# Patient Record
Sex: Male | Born: 1950 | Marital: Married | Smoking: Former smoker
Health system: Southern US, Community
[De-identification: ages and names within clinical notes are randomized; demographics above are authoritative.]

## PROBLEM LIST (undated history)

## (undated) ENCOUNTER — Emergency Department (HOSPITAL_COMMUNITY): Admission: EM | Payer: Self-pay | Source: Home / Self Care

## (undated) DIAGNOSIS — E785 Hyperlipidemia, unspecified: Secondary | ICD-10-CM

## (undated) DIAGNOSIS — I10 Essential (primary) hypertension: Secondary | ICD-10-CM

## (undated) DIAGNOSIS — E119 Type 2 diabetes mellitus without complications: Secondary | ICD-10-CM

---

## 2015-10-31 DIAGNOSIS — E1140 Type 2 diabetes mellitus with diabetic neuropathy, unspecified: Secondary | ICD-10-CM | POA: Diagnosis not present

## 2015-11-07 DIAGNOSIS — K219 Gastro-esophageal reflux disease without esophagitis: Secondary | ICD-10-CM | POA: Diagnosis not present

## 2015-11-07 DIAGNOSIS — R0982 Postnasal drip: Secondary | ICD-10-CM | POA: Diagnosis not present

## 2015-11-07 DIAGNOSIS — J309 Allergic rhinitis, unspecified: Secondary | ICD-10-CM | POA: Diagnosis not present

## 2015-11-07 DIAGNOSIS — E1140 Type 2 diabetes mellitus with diabetic neuropathy, unspecified: Secondary | ICD-10-CM | POA: Diagnosis not present

## 2015-11-07 DIAGNOSIS — E7800 Pure hypercholesterolemia, unspecified: Secondary | ICD-10-CM | POA: Diagnosis not present

## 2015-11-07 DIAGNOSIS — I10 Essential (primary) hypertension: Secondary | ICD-10-CM | POA: Diagnosis not present

## 2015-11-07 DIAGNOSIS — Z0000 Encounter for general adult medical examination without abnormal findings: Secondary | ICD-10-CM | POA: Diagnosis not present

## 2015-11-07 DIAGNOSIS — G629 Polyneuropathy, unspecified: Secondary | ICD-10-CM | POA: Diagnosis not present

## 2016-01-31 DIAGNOSIS — E1140 Type 2 diabetes mellitus with diabetic neuropathy, unspecified: Secondary | ICD-10-CM | POA: Diagnosis not present

## 2016-02-13 DIAGNOSIS — Z23 Encounter for immunization: Secondary | ICD-10-CM | POA: Diagnosis not present

## 2016-02-13 DIAGNOSIS — Z1389 Encounter for screening for other disorder: Secondary | ICD-10-CM | POA: Diagnosis not present

## 2016-02-13 DIAGNOSIS — J309 Allergic rhinitis, unspecified: Secondary | ICD-10-CM | POA: Diagnosis not present

## 2016-02-13 DIAGNOSIS — I10 Essential (primary) hypertension: Secondary | ICD-10-CM | POA: Diagnosis not present

## 2016-02-13 DIAGNOSIS — R0982 Postnasal drip: Secondary | ICD-10-CM | POA: Diagnosis not present

## 2016-02-13 DIAGNOSIS — Z9181 History of falling: Secondary | ICD-10-CM | POA: Diagnosis not present

## 2016-02-13 DIAGNOSIS — E1140 Type 2 diabetes mellitus with diabetic neuropathy, unspecified: Secondary | ICD-10-CM | POA: Diagnosis not present

## 2016-02-13 DIAGNOSIS — K219 Gastro-esophageal reflux disease without esophagitis: Secondary | ICD-10-CM | POA: Diagnosis not present

## 2016-06-03 DIAGNOSIS — E1140 Type 2 diabetes mellitus with diabetic neuropathy, unspecified: Secondary | ICD-10-CM | POA: Diagnosis not present

## 2016-06-11 DIAGNOSIS — K219 Gastro-esophageal reflux disease without esophagitis: Secondary | ICD-10-CM | POA: Diagnosis not present

## 2016-06-11 DIAGNOSIS — J309 Allergic rhinitis, unspecified: Secondary | ICD-10-CM | POA: Diagnosis not present

## 2016-06-11 DIAGNOSIS — R0982 Postnasal drip: Secondary | ICD-10-CM | POA: Diagnosis not present

## 2016-06-11 DIAGNOSIS — I10 Essential (primary) hypertension: Secondary | ICD-10-CM | POA: Diagnosis not present

## 2016-06-11 DIAGNOSIS — E1140 Type 2 diabetes mellitus with diabetic neuropathy, unspecified: Secondary | ICD-10-CM | POA: Diagnosis not present

## 2016-06-11 DIAGNOSIS — E7800 Pure hypercholesterolemia, unspecified: Secondary | ICD-10-CM | POA: Diagnosis not present

## 2016-06-11 DIAGNOSIS — Z23 Encounter for immunization: Secondary | ICD-10-CM | POA: Diagnosis not present

## 2016-07-18 DIAGNOSIS — H25813 Combined forms of age-related cataract, bilateral: Secondary | ICD-10-CM | POA: Diagnosis not present

## 2016-07-18 DIAGNOSIS — E119 Type 2 diabetes mellitus without complications: Secondary | ICD-10-CM | POA: Diagnosis not present

## 2016-09-05 DIAGNOSIS — Z86010 Personal history of colonic polyps: Secondary | ICD-10-CM | POA: Diagnosis not present

## 2016-09-05 DIAGNOSIS — Z1211 Encounter for screening for malignant neoplasm of colon: Secondary | ICD-10-CM | POA: Diagnosis not present

## 2016-09-05 DIAGNOSIS — Z1212 Encounter for screening for malignant neoplasm of rectum: Secondary | ICD-10-CM | POA: Diagnosis not present

## 2016-09-09 DIAGNOSIS — E1142 Type 2 diabetes mellitus with diabetic polyneuropathy: Secondary | ICD-10-CM | POA: Diagnosis not present

## 2016-09-09 DIAGNOSIS — I10 Essential (primary) hypertension: Secondary | ICD-10-CM | POA: Diagnosis not present

## 2016-09-09 DIAGNOSIS — E7800 Pure hypercholesterolemia, unspecified: Secondary | ICD-10-CM | POA: Diagnosis not present

## 2016-09-09 DIAGNOSIS — K219 Gastro-esophageal reflux disease without esophagitis: Secondary | ICD-10-CM | POA: Diagnosis not present

## 2016-09-09 DIAGNOSIS — E1140 Type 2 diabetes mellitus with diabetic neuropathy, unspecified: Secondary | ICD-10-CM | POA: Diagnosis not present

## 2016-09-09 DIAGNOSIS — Z6838 Body mass index (BMI) 38.0-38.9, adult: Secondary | ICD-10-CM | POA: Diagnosis not present

## 2016-10-08 DIAGNOSIS — I10 Essential (primary) hypertension: Secondary | ICD-10-CM | POA: Diagnosis not present

## 2016-10-08 DIAGNOSIS — Z86010 Personal history of colonic polyps: Secondary | ICD-10-CM | POA: Diagnosis not present

## 2016-10-08 DIAGNOSIS — M109 Gout, unspecified: Secondary | ICD-10-CM | POA: Diagnosis not present

## 2016-10-08 DIAGNOSIS — Z79899 Other long term (current) drug therapy: Secondary | ICD-10-CM | POA: Diagnosis not present

## 2016-10-08 DIAGNOSIS — Z1211 Encounter for screening for malignant neoplasm of colon: Secondary | ICD-10-CM | POA: Diagnosis not present

## 2016-10-08 DIAGNOSIS — D124 Benign neoplasm of descending colon: Secondary | ICD-10-CM | POA: Diagnosis not present

## 2016-10-08 DIAGNOSIS — E119 Type 2 diabetes mellitus without complications: Secondary | ICD-10-CM | POA: Diagnosis not present

## 2016-10-08 DIAGNOSIS — K5730 Diverticulosis of large intestine without perforation or abscess without bleeding: Secondary | ICD-10-CM | POA: Diagnosis not present

## 2016-10-08 DIAGNOSIS — K648 Other hemorrhoids: Secondary | ICD-10-CM | POA: Diagnosis not present

## 2016-10-08 DIAGNOSIS — Z7984 Long term (current) use of oral hypoglycemic drugs: Secondary | ICD-10-CM | POA: Diagnosis not present

## 2016-11-05 DIAGNOSIS — W06XXXA Fall from bed, initial encounter: Secondary | ICD-10-CM | POA: Diagnosis not present

## 2016-11-05 DIAGNOSIS — S0511XA Contusion of eyeball and orbital tissues, right eye, initial encounter: Secondary | ICD-10-CM | POA: Diagnosis not present

## 2016-11-05 DIAGNOSIS — S0083XA Contusion of other part of head, initial encounter: Secondary | ICD-10-CM | POA: Diagnosis not present

## 2016-11-05 DIAGNOSIS — S065X0A Traumatic subdural hemorrhage without loss of consciousness, initial encounter: Secondary | ICD-10-CM | POA: Diagnosis not present

## 2017-01-07 DIAGNOSIS — E1140 Type 2 diabetes mellitus with diabetic neuropathy, unspecified: Secondary | ICD-10-CM | POA: Diagnosis not present

## 2017-01-14 DIAGNOSIS — E1140 Type 2 diabetes mellitus with diabetic neuropathy, unspecified: Secondary | ICD-10-CM | POA: Diagnosis not present

## 2017-01-14 DIAGNOSIS — E785 Hyperlipidemia, unspecified: Secondary | ICD-10-CM | POA: Diagnosis not present

## 2017-01-14 DIAGNOSIS — Z0000 Encounter for general adult medical examination without abnormal findings: Secondary | ICD-10-CM | POA: Diagnosis not present

## 2017-01-14 DIAGNOSIS — K219 Gastro-esophageal reflux disease without esophagitis: Secondary | ICD-10-CM | POA: Diagnosis not present

## 2017-03-10 DIAGNOSIS — Z23 Encounter for immunization: Secondary | ICD-10-CM | POA: Diagnosis not present

## 2017-07-08 DIAGNOSIS — E1140 Type 2 diabetes mellitus with diabetic neuropathy, unspecified: Secondary | ICD-10-CM | POA: Diagnosis not present

## 2017-07-15 DIAGNOSIS — M1A00X0 Idiopathic chronic gout, unspecified site, without tophus (tophi): Secondary | ICD-10-CM | POA: Diagnosis not present

## 2017-07-15 DIAGNOSIS — K219 Gastro-esophageal reflux disease without esophagitis: Secondary | ICD-10-CM | POA: Diagnosis not present

## 2017-07-15 DIAGNOSIS — R0982 Postnasal drip: Secondary | ICD-10-CM | POA: Diagnosis not present

## 2017-07-15 DIAGNOSIS — E1140 Type 2 diabetes mellitus with diabetic neuropathy, unspecified: Secondary | ICD-10-CM | POA: Diagnosis not present

## 2017-07-15 DIAGNOSIS — Z1331 Encounter for screening for depression: Secondary | ICD-10-CM | POA: Diagnosis not present

## 2017-07-15 DIAGNOSIS — J309 Allergic rhinitis, unspecified: Secondary | ICD-10-CM | POA: Diagnosis not present

## 2017-07-15 DIAGNOSIS — Z1339 Encounter for screening examination for other mental health and behavioral disorders: Secondary | ICD-10-CM | POA: Diagnosis not present

## 2017-07-15 DIAGNOSIS — Z6839 Body mass index (BMI) 39.0-39.9, adult: Secondary | ICD-10-CM | POA: Diagnosis not present

## 2017-07-15 DIAGNOSIS — I10 Essential (primary) hypertension: Secondary | ICD-10-CM | POA: Diagnosis not present

## 2017-07-15 DIAGNOSIS — E7800 Pure hypercholesterolemia, unspecified: Secondary | ICD-10-CM | POA: Diagnosis not present

## 2017-07-15 DIAGNOSIS — Z9181 History of falling: Secondary | ICD-10-CM | POA: Diagnosis not present

## 2017-09-15 DIAGNOSIS — J329 Chronic sinusitis, unspecified: Secondary | ICD-10-CM | POA: Diagnosis not present

## 2017-09-15 DIAGNOSIS — J40 Bronchitis, not specified as acute or chronic: Secondary | ICD-10-CM | POA: Diagnosis not present

## 2017-09-15 DIAGNOSIS — Z6838 Body mass index (BMI) 38.0-38.9, adult: Secondary | ICD-10-CM | POA: Diagnosis not present

## 2017-10-02 DIAGNOSIS — E1140 Type 2 diabetes mellitus with diabetic neuropathy, unspecified: Secondary | ICD-10-CM | POA: Diagnosis not present

## 2017-10-09 DIAGNOSIS — I10 Essential (primary) hypertension: Secondary | ICD-10-CM | POA: Diagnosis not present

## 2017-10-09 DIAGNOSIS — E785 Hyperlipidemia, unspecified: Secondary | ICD-10-CM | POA: Diagnosis not present

## 2017-10-09 DIAGNOSIS — Z6838 Body mass index (BMI) 38.0-38.9, adult: Secondary | ICD-10-CM | POA: Diagnosis not present

## 2017-10-09 DIAGNOSIS — J309 Allergic rhinitis, unspecified: Secondary | ICD-10-CM | POA: Diagnosis not present

## 2017-10-09 DIAGNOSIS — N400 Enlarged prostate without lower urinary tract symptoms: Secondary | ICD-10-CM | POA: Diagnosis not present

## 2017-10-09 DIAGNOSIS — M1A00X0 Idiopathic chronic gout, unspecified site, without tophus (tophi): Secondary | ICD-10-CM | POA: Diagnosis not present

## 2017-10-09 DIAGNOSIS — E1169 Type 2 diabetes mellitus with other specified complication: Secondary | ICD-10-CM | POA: Diagnosis not present

## 2017-10-09 DIAGNOSIS — K219 Gastro-esophageal reflux disease without esophagitis: Secondary | ICD-10-CM | POA: Diagnosis not present

## 2017-10-09 DIAGNOSIS — E1142 Type 2 diabetes mellitus with diabetic polyneuropathy: Secondary | ICD-10-CM | POA: Diagnosis not present

## 2017-10-09 DIAGNOSIS — B3742 Candidal balanitis: Secondary | ICD-10-CM | POA: Diagnosis not present

## 2017-10-09 DIAGNOSIS — R0982 Postnasal drip: Secondary | ICD-10-CM | POA: Diagnosis not present

## 2018-02-03 DIAGNOSIS — E785 Hyperlipidemia, unspecified: Secondary | ICD-10-CM | POA: Diagnosis not present

## 2018-02-03 DIAGNOSIS — E1169 Type 2 diabetes mellitus with other specified complication: Secondary | ICD-10-CM | POA: Diagnosis not present

## 2018-02-03 DIAGNOSIS — Z79899 Other long term (current) drug therapy: Secondary | ICD-10-CM | POA: Diagnosis not present

## 2018-02-03 DIAGNOSIS — Z125 Encounter for screening for malignant neoplasm of prostate: Secondary | ICD-10-CM | POA: Diagnosis not present

## 2018-02-19 DIAGNOSIS — R0982 Postnasal drip: Secondary | ICD-10-CM | POA: Diagnosis not present

## 2018-02-19 DIAGNOSIS — M791 Myalgia, unspecified site: Secondary | ICD-10-CM | POA: Diagnosis not present

## 2018-02-19 DIAGNOSIS — M1A00X0 Idiopathic chronic gout, unspecified site, without tophus (tophi): Secondary | ICD-10-CM | POA: Diagnosis not present

## 2018-02-19 DIAGNOSIS — Z23 Encounter for immunization: Secondary | ICD-10-CM | POA: Diagnosis not present

## 2018-02-19 DIAGNOSIS — E1142 Type 2 diabetes mellitus with diabetic polyneuropathy: Secondary | ICD-10-CM | POA: Diagnosis not present

## 2018-02-19 DIAGNOSIS — Z0000 Encounter for general adult medical examination without abnormal findings: Secondary | ICD-10-CM | POA: Diagnosis not present

## 2018-02-19 DIAGNOSIS — E1169 Type 2 diabetes mellitus with other specified complication: Secondary | ICD-10-CM | POA: Diagnosis not present

## 2018-02-19 DIAGNOSIS — I10 Essential (primary) hypertension: Secondary | ICD-10-CM | POA: Diagnosis not present

## 2018-02-19 DIAGNOSIS — T466X5A Adverse effect of antihyperlipidemic and antiarteriosclerotic drugs, initial encounter: Secondary | ICD-10-CM | POA: Diagnosis not present

## 2018-02-19 DIAGNOSIS — J309 Allergic rhinitis, unspecified: Secondary | ICD-10-CM | POA: Diagnosis not present

## 2018-02-19 DIAGNOSIS — K219 Gastro-esophageal reflux disease without esophagitis: Secondary | ICD-10-CM | POA: Diagnosis not present

## 2018-02-19 DIAGNOSIS — E785 Hyperlipidemia, unspecified: Secondary | ICD-10-CM | POA: Diagnosis not present

## 2018-03-01 DIAGNOSIS — H25813 Combined forms of age-related cataract, bilateral: Secondary | ICD-10-CM | POA: Diagnosis not present

## 2018-03-01 DIAGNOSIS — E119 Type 2 diabetes mellitus without complications: Secondary | ICD-10-CM | POA: Diagnosis not present

## 2018-03-18 DIAGNOSIS — I10 Essential (primary) hypertension: Secondary | ICD-10-CM | POA: Diagnosis not present

## 2018-03-18 DIAGNOSIS — E785 Hyperlipidemia, unspecified: Secondary | ICD-10-CM | POA: Diagnosis not present

## 2018-04-17 DIAGNOSIS — K219 Gastro-esophageal reflux disease without esophagitis: Secondary | ICD-10-CM | POA: Diagnosis not present

## 2018-04-17 DIAGNOSIS — E1169 Type 2 diabetes mellitus with other specified complication: Secondary | ICD-10-CM | POA: Diagnosis not present

## 2018-04-17 DIAGNOSIS — E785 Hyperlipidemia, unspecified: Secondary | ICD-10-CM | POA: Diagnosis not present

## 2018-04-19 DIAGNOSIS — E1169 Type 2 diabetes mellitus with other specified complication: Secondary | ICD-10-CM | POA: Diagnosis not present

## 2018-04-19 DIAGNOSIS — Z79899 Other long term (current) drug therapy: Secondary | ICD-10-CM | POA: Diagnosis not present

## 2018-04-19 DIAGNOSIS — E785 Hyperlipidemia, unspecified: Secondary | ICD-10-CM | POA: Diagnosis not present

## 2018-05-18 DIAGNOSIS — F5221 Male erectile disorder: Secondary | ICD-10-CM | POA: Diagnosis not present

## 2018-05-18 DIAGNOSIS — E1142 Type 2 diabetes mellitus with diabetic polyneuropathy: Secondary | ICD-10-CM | POA: Diagnosis not present

## 2018-05-18 DIAGNOSIS — E785 Hyperlipidemia, unspecified: Secondary | ICD-10-CM | POA: Diagnosis not present

## 2018-07-17 DIAGNOSIS — I10 Essential (primary) hypertension: Secondary | ICD-10-CM | POA: Diagnosis not present

## 2018-07-17 DIAGNOSIS — E785 Hyperlipidemia, unspecified: Secondary | ICD-10-CM | POA: Diagnosis not present

## 2018-08-02 DIAGNOSIS — E1169 Type 2 diabetes mellitus with other specified complication: Secondary | ICD-10-CM | POA: Diagnosis not present

## 2018-08-17 DIAGNOSIS — E1169 Type 2 diabetes mellitus with other specified complication: Secondary | ICD-10-CM | POA: Diagnosis not present

## 2018-08-17 DIAGNOSIS — E785 Hyperlipidemia, unspecified: Secondary | ICD-10-CM | POA: Diagnosis not present

## 2018-09-16 DIAGNOSIS — E1142 Type 2 diabetes mellitus with diabetic polyneuropathy: Secondary | ICD-10-CM | POA: Diagnosis not present

## 2018-09-16 DIAGNOSIS — I10 Essential (primary) hypertension: Secondary | ICD-10-CM | POA: Diagnosis not present

## 2018-10-16 DIAGNOSIS — E1142 Type 2 diabetes mellitus with diabetic polyneuropathy: Secondary | ICD-10-CM | POA: Diagnosis not present

## 2018-10-16 DIAGNOSIS — E785 Hyperlipidemia, unspecified: Secondary | ICD-10-CM | POA: Diagnosis not present

## 2018-11-16 DIAGNOSIS — E1142 Type 2 diabetes mellitus with diabetic polyneuropathy: Secondary | ICD-10-CM | POA: Diagnosis not present

## 2018-11-16 DIAGNOSIS — I10 Essential (primary) hypertension: Secondary | ICD-10-CM | POA: Diagnosis not present

## 2018-12-02 DIAGNOSIS — E1169 Type 2 diabetes mellitus with other specified complication: Secondary | ICD-10-CM | POA: Diagnosis not present

## 2018-12-02 DIAGNOSIS — M109 Gout, unspecified: Secondary | ICD-10-CM | POA: Diagnosis not present

## 2018-12-16 ENCOUNTER — Other Ambulatory Visit: Payer: Self-pay

## 2018-12-17 DIAGNOSIS — E1165 Type 2 diabetes mellitus with hyperglycemia: Secondary | ICD-10-CM | POA: Diagnosis not present

## 2018-12-17 DIAGNOSIS — E782 Mixed hyperlipidemia: Secondary | ICD-10-CM | POA: Diagnosis not present

## 2018-12-17 DIAGNOSIS — K219 Gastro-esophageal reflux disease without esophagitis: Secondary | ICD-10-CM | POA: Diagnosis not present

## 2019-01-17 DIAGNOSIS — E1169 Type 2 diabetes mellitus with other specified complication: Secondary | ICD-10-CM | POA: Diagnosis not present

## 2019-01-17 DIAGNOSIS — I10 Essential (primary) hypertension: Secondary | ICD-10-CM | POA: Diagnosis not present

## 2019-01-17 DIAGNOSIS — E785 Hyperlipidemia, unspecified: Secondary | ICD-10-CM | POA: Diagnosis not present

## 2019-02-11 DIAGNOSIS — L570 Actinic keratosis: Secondary | ICD-10-CM | POA: Diagnosis not present

## 2019-02-11 DIAGNOSIS — C44311 Basal cell carcinoma of skin of nose: Secondary | ICD-10-CM | POA: Diagnosis not present

## 2019-02-11 DIAGNOSIS — L578 Other skin changes due to chronic exposure to nonionizing radiation: Secondary | ICD-10-CM | POA: Diagnosis not present

## 2019-02-11 DIAGNOSIS — L728 Other follicular cysts of the skin and subcutaneous tissue: Secondary | ICD-10-CM | POA: Diagnosis not present

## 2019-02-16 DIAGNOSIS — Z23 Encounter for immunization: Secondary | ICD-10-CM | POA: Diagnosis not present

## 2019-03-07 DIAGNOSIS — E1169 Type 2 diabetes mellitus with other specified complication: Secondary | ICD-10-CM | POA: Diagnosis not present

## 2019-03-18 DIAGNOSIS — E1165 Type 2 diabetes mellitus with hyperglycemia: Secondary | ICD-10-CM | POA: Diagnosis not present

## 2019-03-18 DIAGNOSIS — I10 Essential (primary) hypertension: Secondary | ICD-10-CM | POA: Diagnosis not present

## 2019-03-18 DIAGNOSIS — F5221 Male erectile disorder: Secondary | ICD-10-CM | POA: Diagnosis not present

## 2019-03-23 DIAGNOSIS — R0982 Postnasal drip: Secondary | ICD-10-CM | POA: Diagnosis not present

## 2019-03-23 DIAGNOSIS — Z0000 Encounter for general adult medical examination without abnormal findings: Secondary | ICD-10-CM | POA: Diagnosis not present

## 2019-03-23 DIAGNOSIS — E1169 Type 2 diabetes mellitus with other specified complication: Secondary | ICD-10-CM | POA: Diagnosis not present

## 2019-03-23 DIAGNOSIS — N400 Enlarged prostate without lower urinary tract symptoms: Secondary | ICD-10-CM | POA: Diagnosis not present

## 2019-03-23 DIAGNOSIS — E1142 Type 2 diabetes mellitus with diabetic polyneuropathy: Secondary | ICD-10-CM | POA: Diagnosis not present

## 2019-03-23 DIAGNOSIS — K219 Gastro-esophageal reflux disease without esophagitis: Secondary | ICD-10-CM | POA: Diagnosis not present

## 2019-03-23 DIAGNOSIS — J309 Allergic rhinitis, unspecified: Secondary | ICD-10-CM | POA: Diagnosis not present

## 2019-03-23 DIAGNOSIS — E785 Hyperlipidemia, unspecified: Secondary | ICD-10-CM | POA: Diagnosis not present

## 2019-03-23 DIAGNOSIS — M1A00X0 Idiopathic chronic gout, unspecified site, without tophus (tophi): Secondary | ICD-10-CM | POA: Diagnosis not present

## 2019-03-23 DIAGNOSIS — I10 Essential (primary) hypertension: Secondary | ICD-10-CM | POA: Diagnosis not present

## 2019-03-23 DIAGNOSIS — F5221 Male erectile disorder: Secondary | ICD-10-CM | POA: Diagnosis not present

## 2019-04-18 DIAGNOSIS — E785 Hyperlipidemia, unspecified: Secondary | ICD-10-CM | POA: Diagnosis not present

## 2019-04-18 DIAGNOSIS — I10 Essential (primary) hypertension: Secondary | ICD-10-CM | POA: Diagnosis not present

## 2019-04-18 DIAGNOSIS — N400 Enlarged prostate without lower urinary tract symptoms: Secondary | ICD-10-CM | POA: Diagnosis not present

## 2019-05-09 DIAGNOSIS — U071 COVID-19: Secondary | ICD-10-CM | POA: Diagnosis not present

## 2019-05-09 DIAGNOSIS — R05 Cough: Secondary | ICD-10-CM | POA: Diagnosis not present

## 2019-05-09 DIAGNOSIS — R5383 Other fatigue: Secondary | ICD-10-CM | POA: Diagnosis not present

## 2019-05-09 DIAGNOSIS — R5381 Other malaise: Secondary | ICD-10-CM | POA: Diagnosis not present

## 2019-05-09 DIAGNOSIS — Z20828 Contact with and (suspected) exposure to other viral communicable diseases: Secondary | ICD-10-CM | POA: Diagnosis not present

## 2019-05-19 DIAGNOSIS — N400 Enlarged prostate without lower urinary tract symptoms: Secondary | ICD-10-CM | POA: Diagnosis not present

## 2019-05-19 DIAGNOSIS — I10 Essential (primary) hypertension: Secondary | ICD-10-CM | POA: Diagnosis not present

## 2019-06-08 DIAGNOSIS — Z79899 Other long term (current) drug therapy: Secondary | ICD-10-CM | POA: Diagnosis not present

## 2019-06-08 DIAGNOSIS — E1169 Type 2 diabetes mellitus with other specified complication: Secondary | ICD-10-CM | POA: Diagnosis not present

## 2019-06-08 DIAGNOSIS — Z125 Encounter for screening for malignant neoplasm of prostate: Secondary | ICD-10-CM | POA: Diagnosis not present

## 2019-06-08 DIAGNOSIS — E785 Hyperlipidemia, unspecified: Secondary | ICD-10-CM | POA: Diagnosis not present

## 2019-06-18 DIAGNOSIS — E1169 Type 2 diabetes mellitus with other specified complication: Secondary | ICD-10-CM | POA: Diagnosis not present

## 2019-06-18 DIAGNOSIS — E785 Hyperlipidemia, unspecified: Secondary | ICD-10-CM | POA: Diagnosis not present

## 2019-06-18 DIAGNOSIS — I10 Essential (primary) hypertension: Secondary | ICD-10-CM | POA: Diagnosis not present

## 2019-07-17 DIAGNOSIS — I10 Essential (primary) hypertension: Secondary | ICD-10-CM | POA: Diagnosis not present

## 2019-07-17 DIAGNOSIS — K219 Gastro-esophageal reflux disease without esophagitis: Secondary | ICD-10-CM | POA: Diagnosis not present

## 2019-08-15 DIAGNOSIS — E1169 Type 2 diabetes mellitus with other specified complication: Secondary | ICD-10-CM | POA: Diagnosis not present

## 2019-08-15 DIAGNOSIS — E1142 Type 2 diabetes mellitus with diabetic polyneuropathy: Secondary | ICD-10-CM | POA: Diagnosis not present

## 2019-08-15 DIAGNOSIS — Z1331 Encounter for screening for depression: Secondary | ICD-10-CM | POA: Diagnosis not present

## 2019-08-15 DIAGNOSIS — Z6839 Body mass index (BMI) 39.0-39.9, adult: Secondary | ICD-10-CM | POA: Diagnosis not present

## 2019-08-15 DIAGNOSIS — M62830 Muscle spasm of back: Secondary | ICD-10-CM | POA: Diagnosis not present

## 2019-08-15 DIAGNOSIS — M542 Cervicalgia: Secondary | ICD-10-CM | POA: Diagnosis not present

## 2019-08-15 DIAGNOSIS — E785 Hyperlipidemia, unspecified: Secondary | ICD-10-CM | POA: Diagnosis not present

## 2019-08-15 DIAGNOSIS — I10 Essential (primary) hypertension: Secondary | ICD-10-CM | POA: Diagnosis not present

## 2019-08-17 DIAGNOSIS — I10 Essential (primary) hypertension: Secondary | ICD-10-CM | POA: Diagnosis not present

## 2019-08-17 DIAGNOSIS — E782 Mixed hyperlipidemia: Secondary | ICD-10-CM | POA: Diagnosis not present

## 2019-08-22 DIAGNOSIS — I10 Essential (primary) hypertension: Secondary | ICD-10-CM | POA: Diagnosis not present

## 2019-08-22 DIAGNOSIS — E1142 Type 2 diabetes mellitus with diabetic polyneuropathy: Secondary | ICD-10-CM | POA: Diagnosis not present

## 2019-08-22 DIAGNOSIS — M62830 Muscle spasm of back: Secondary | ICD-10-CM | POA: Diagnosis not present

## 2019-08-22 DIAGNOSIS — E1169 Type 2 diabetes mellitus with other specified complication: Secondary | ICD-10-CM | POA: Diagnosis not present

## 2019-08-22 DIAGNOSIS — Z6841 Body Mass Index (BMI) 40.0 and over, adult: Secondary | ICD-10-CM | POA: Diagnosis not present

## 2019-08-22 DIAGNOSIS — M542 Cervicalgia: Secondary | ICD-10-CM | POA: Diagnosis not present

## 2019-08-22 DIAGNOSIS — E785 Hyperlipidemia, unspecified: Secondary | ICD-10-CM | POA: Diagnosis not present

## 2019-09-06 DIAGNOSIS — E1169 Type 2 diabetes mellitus with other specified complication: Secondary | ICD-10-CM | POA: Diagnosis not present

## 2019-09-16 DIAGNOSIS — I10 Essential (primary) hypertension: Secondary | ICD-10-CM | POA: Diagnosis not present

## 2019-09-16 DIAGNOSIS — E1169 Type 2 diabetes mellitus with other specified complication: Secondary | ICD-10-CM | POA: Diagnosis not present

## 2019-10-17 DIAGNOSIS — I10 Essential (primary) hypertension: Secondary | ICD-10-CM | POA: Diagnosis not present

## 2019-10-17 DIAGNOSIS — K219 Gastro-esophageal reflux disease without esophagitis: Secondary | ICD-10-CM | POA: Diagnosis not present

## 2019-10-17 DIAGNOSIS — E1169 Type 2 diabetes mellitus with other specified complication: Secondary | ICD-10-CM | POA: Diagnosis not present

## 2019-10-17 DIAGNOSIS — E785 Hyperlipidemia, unspecified: Secondary | ICD-10-CM | POA: Diagnosis not present

## 2019-11-16 DIAGNOSIS — K219 Gastro-esophageal reflux disease without esophagitis: Secondary | ICD-10-CM | POA: Diagnosis not present

## 2019-11-16 DIAGNOSIS — E785 Hyperlipidemia, unspecified: Secondary | ICD-10-CM | POA: Diagnosis not present

## 2019-11-16 DIAGNOSIS — E1169 Type 2 diabetes mellitus with other specified complication: Secondary | ICD-10-CM | POA: Diagnosis not present

## 2019-11-16 DIAGNOSIS — I10 Essential (primary) hypertension: Secondary | ICD-10-CM | POA: Diagnosis not present

## 2019-12-17 DIAGNOSIS — E785 Hyperlipidemia, unspecified: Secondary | ICD-10-CM | POA: Diagnosis not present

## 2019-12-17 DIAGNOSIS — K219 Gastro-esophageal reflux disease without esophagitis: Secondary | ICD-10-CM | POA: Diagnosis not present

## 2019-12-17 DIAGNOSIS — I10 Essential (primary) hypertension: Secondary | ICD-10-CM | POA: Diagnosis not present

## 2019-12-17 DIAGNOSIS — E1169 Type 2 diabetes mellitus with other specified complication: Secondary | ICD-10-CM | POA: Diagnosis not present

## 2019-12-22 DIAGNOSIS — L570 Actinic keratosis: Secondary | ICD-10-CM | POA: Diagnosis not present

## 2019-12-27 DIAGNOSIS — E1169 Type 2 diabetes mellitus with other specified complication: Secondary | ICD-10-CM | POA: Diagnosis not present

## 2020-01-10 DIAGNOSIS — Z6841 Body Mass Index (BMI) 40.0 and over, adult: Secondary | ICD-10-CM | POA: Diagnosis not present

## 2020-01-10 DIAGNOSIS — H109 Unspecified conjunctivitis: Secondary | ICD-10-CM | POA: Diagnosis not present

## 2020-01-10 DIAGNOSIS — R0982 Postnasal drip: Secondary | ICD-10-CM | POA: Diagnosis not present

## 2020-01-10 DIAGNOSIS — J309 Allergic rhinitis, unspecified: Secondary | ICD-10-CM | POA: Diagnosis not present

## 2020-01-17 DIAGNOSIS — I10 Essential (primary) hypertension: Secondary | ICD-10-CM | POA: Diagnosis not present

## 2020-01-17 DIAGNOSIS — K219 Gastro-esophageal reflux disease without esophagitis: Secondary | ICD-10-CM | POA: Diagnosis not present

## 2020-01-17 DIAGNOSIS — E785 Hyperlipidemia, unspecified: Secondary | ICD-10-CM | POA: Diagnosis not present

## 2020-01-17 DIAGNOSIS — E1169 Type 2 diabetes mellitus with other specified complication: Secondary | ICD-10-CM | POA: Diagnosis not present

## 2020-01-24 DIAGNOSIS — H524 Presbyopia: Secondary | ICD-10-CM | POA: Diagnosis not present

## 2020-02-08 DIAGNOSIS — J329 Chronic sinusitis, unspecified: Secondary | ICD-10-CM | POA: Diagnosis not present

## 2020-02-08 DIAGNOSIS — E1169 Type 2 diabetes mellitus with other specified complication: Secondary | ICD-10-CM | POA: Diagnosis not present

## 2020-02-08 DIAGNOSIS — J40 Bronchitis, not specified as acute or chronic: Secondary | ICD-10-CM | POA: Diagnosis not present

## 2020-02-08 DIAGNOSIS — E785 Hyperlipidemia, unspecified: Secondary | ICD-10-CM | POA: Diagnosis not present

## 2020-02-16 DIAGNOSIS — E1169 Type 2 diabetes mellitus with other specified complication: Secondary | ICD-10-CM | POA: Diagnosis not present

## 2020-02-16 DIAGNOSIS — K219 Gastro-esophageal reflux disease without esophagitis: Secondary | ICD-10-CM | POA: Diagnosis not present

## 2020-02-16 DIAGNOSIS — E785 Hyperlipidemia, unspecified: Secondary | ICD-10-CM | POA: Diagnosis not present

## 2020-02-16 DIAGNOSIS — I10 Essential (primary) hypertension: Secondary | ICD-10-CM | POA: Diagnosis not present

## 2020-03-17 DIAGNOSIS — E1169 Type 2 diabetes mellitus with other specified complication: Secondary | ICD-10-CM | POA: Diagnosis not present

## 2020-03-17 DIAGNOSIS — I10 Essential (primary) hypertension: Secondary | ICD-10-CM | POA: Diagnosis not present

## 2020-03-17 DIAGNOSIS — E785 Hyperlipidemia, unspecified: Secondary | ICD-10-CM | POA: Diagnosis not present

## 2020-03-17 DIAGNOSIS — K219 Gastro-esophageal reflux disease without esophagitis: Secondary | ICD-10-CM | POA: Diagnosis not present

## 2020-03-27 DIAGNOSIS — Z6841 Body Mass Index (BMI) 40.0 and over, adult: Secondary | ICD-10-CM | POA: Diagnosis not present

## 2020-03-27 DIAGNOSIS — D2371 Other benign neoplasm of skin of right lower limb, including hip: Secondary | ICD-10-CM | POA: Diagnosis not present

## 2020-03-27 DIAGNOSIS — Z2821 Immunization not carried out because of patient refusal: Secondary | ICD-10-CM | POA: Diagnosis not present

## 2020-03-27 DIAGNOSIS — Z9181 History of falling: Secondary | ICD-10-CM | POA: Diagnosis not present

## 2020-04-17 DIAGNOSIS — K219 Gastro-esophageal reflux disease without esophagitis: Secondary | ICD-10-CM | POA: Diagnosis not present

## 2020-04-17 DIAGNOSIS — E1169 Type 2 diabetes mellitus with other specified complication: Secondary | ICD-10-CM | POA: Diagnosis not present

## 2020-04-17 DIAGNOSIS — E785 Hyperlipidemia, unspecified: Secondary | ICD-10-CM | POA: Diagnosis not present

## 2020-04-17 DIAGNOSIS — I10 Essential (primary) hypertension: Secondary | ICD-10-CM | POA: Diagnosis not present

## 2020-05-02 DIAGNOSIS — E1142 Type 2 diabetes mellitus with diabetic polyneuropathy: Secondary | ICD-10-CM | POA: Diagnosis not present

## 2020-05-02 DIAGNOSIS — J329 Chronic sinusitis, unspecified: Secondary | ICD-10-CM | POA: Diagnosis not present

## 2020-05-02 DIAGNOSIS — E785 Hyperlipidemia, unspecified: Secondary | ICD-10-CM | POA: Diagnosis not present

## 2020-05-02 DIAGNOSIS — J40 Bronchitis, not specified as acute or chronic: Secondary | ICD-10-CM | POA: Diagnosis not present

## 2020-05-02 DIAGNOSIS — E1169 Type 2 diabetes mellitus with other specified complication: Secondary | ICD-10-CM | POA: Diagnosis not present

## 2020-05-02 DIAGNOSIS — I10 Essential (primary) hypertension: Secondary | ICD-10-CM | POA: Diagnosis not present

## 2020-05-17 DIAGNOSIS — E785 Hyperlipidemia, unspecified: Secondary | ICD-10-CM | POA: Diagnosis not present

## 2020-05-17 DIAGNOSIS — Z0000 Encounter for general adult medical examination without abnormal findings: Secondary | ICD-10-CM | POA: Diagnosis not present

## 2020-05-17 DIAGNOSIS — I10 Essential (primary) hypertension: Secondary | ICD-10-CM | POA: Diagnosis not present

## 2020-05-17 DIAGNOSIS — E1142 Type 2 diabetes mellitus with diabetic polyneuropathy: Secondary | ICD-10-CM | POA: Diagnosis not present

## 2020-05-17 DIAGNOSIS — K219 Gastro-esophageal reflux disease without esophagitis: Secondary | ICD-10-CM | POA: Diagnosis not present

## 2020-06-17 DIAGNOSIS — I10 Essential (primary) hypertension: Secondary | ICD-10-CM | POA: Diagnosis not present

## 2020-06-17 DIAGNOSIS — E785 Hyperlipidemia, unspecified: Secondary | ICD-10-CM | POA: Diagnosis not present

## 2020-06-17 DIAGNOSIS — E1169 Type 2 diabetes mellitus with other specified complication: Secondary | ICD-10-CM | POA: Diagnosis not present

## 2020-06-17 DIAGNOSIS — K219 Gastro-esophageal reflux disease without esophagitis: Secondary | ICD-10-CM | POA: Diagnosis not present

## 2020-07-03 DIAGNOSIS — E785 Hyperlipidemia, unspecified: Secondary | ICD-10-CM | POA: Diagnosis not present

## 2020-07-03 DIAGNOSIS — Z79899 Other long term (current) drug therapy: Secondary | ICD-10-CM | POA: Diagnosis not present

## 2020-07-03 DIAGNOSIS — E1169 Type 2 diabetes mellitus with other specified complication: Secondary | ICD-10-CM | POA: Diagnosis not present

## 2020-07-16 DIAGNOSIS — K219 Gastro-esophageal reflux disease without esophagitis: Secondary | ICD-10-CM | POA: Diagnosis not present

## 2020-07-16 DIAGNOSIS — E785 Hyperlipidemia, unspecified: Secondary | ICD-10-CM | POA: Diagnosis not present

## 2020-07-16 DIAGNOSIS — I10 Essential (primary) hypertension: Secondary | ICD-10-CM | POA: Diagnosis not present

## 2020-07-16 DIAGNOSIS — E1169 Type 2 diabetes mellitus with other specified complication: Secondary | ICD-10-CM | POA: Diagnosis not present

## 2020-09-15 DIAGNOSIS — I10 Essential (primary) hypertension: Secondary | ICD-10-CM | POA: Diagnosis not present

## 2020-09-15 DIAGNOSIS — E785 Hyperlipidemia, unspecified: Secondary | ICD-10-CM | POA: Diagnosis not present

## 2020-09-15 DIAGNOSIS — E1169 Type 2 diabetes mellitus with other specified complication: Secondary | ICD-10-CM | POA: Diagnosis not present

## 2020-09-19 DIAGNOSIS — I10 Essential (primary) hypertension: Secondary | ICD-10-CM | POA: Diagnosis not present

## 2020-09-19 DIAGNOSIS — J329 Chronic sinusitis, unspecified: Secondary | ICD-10-CM | POA: Diagnosis not present

## 2020-09-19 DIAGNOSIS — Z6841 Body Mass Index (BMI) 40.0 and over, adult: Secondary | ICD-10-CM | POA: Diagnosis not present

## 2020-09-19 DIAGNOSIS — E1142 Type 2 diabetes mellitus with diabetic polyneuropathy: Secondary | ICD-10-CM | POA: Diagnosis not present

## 2020-09-19 DIAGNOSIS — E1169 Type 2 diabetes mellitus with other specified complication: Secondary | ICD-10-CM | POA: Diagnosis not present

## 2020-09-19 DIAGNOSIS — E785 Hyperlipidemia, unspecified: Secondary | ICD-10-CM | POA: Diagnosis not present

## 2020-09-19 DIAGNOSIS — J40 Bronchitis, not specified as acute or chronic: Secondary | ICD-10-CM | POA: Diagnosis not present

## 2020-11-15 DIAGNOSIS — K219 Gastro-esophageal reflux disease without esophagitis: Secondary | ICD-10-CM | POA: Diagnosis not present

## 2020-11-15 DIAGNOSIS — I10 Essential (primary) hypertension: Secondary | ICD-10-CM | POA: Diagnosis not present

## 2020-11-15 DIAGNOSIS — E1169 Type 2 diabetes mellitus with other specified complication: Secondary | ICD-10-CM | POA: Diagnosis not present

## 2020-11-15 DIAGNOSIS — E785 Hyperlipidemia, unspecified: Secondary | ICD-10-CM | POA: Diagnosis not present

## 2020-12-16 DIAGNOSIS — K219 Gastro-esophageal reflux disease without esophagitis: Secondary | ICD-10-CM | POA: Diagnosis not present

## 2020-12-16 DIAGNOSIS — I10 Essential (primary) hypertension: Secondary | ICD-10-CM | POA: Diagnosis not present

## 2020-12-16 DIAGNOSIS — E785 Hyperlipidemia, unspecified: Secondary | ICD-10-CM | POA: Diagnosis not present

## 2020-12-16 DIAGNOSIS — E1169 Type 2 diabetes mellitus with other specified complication: Secondary | ICD-10-CM | POA: Diagnosis not present

## 2020-12-28 ENCOUNTER — Emergency Department (HOSPITAL_COMMUNITY): Payer: PPO

## 2020-12-28 ENCOUNTER — Inpatient Hospital Stay (HOSPITAL_COMMUNITY)
Admission: EM | Admit: 2020-12-28 | Discharge: 2021-03-01 | DRG: 004 | Disposition: A | Discharge status: Rehabilitation Facility | Payer: PPO | Attending: Surgery | Admitting: Surgery

## 2020-12-28 DIAGNOSIS — D6489 Other specified anemias: Secondary | ICD-10-CM | POA: Diagnosis present

## 2020-12-28 DIAGNOSIS — S066XAD Traumatic subarachnoid hemorrhage with loss of consciousness status unknown, subsequent encounter: Secondary | ICD-10-CM | POA: Diagnosis not present

## 2020-12-28 DIAGNOSIS — J9601 Acute respiratory failure with hypoxia: Secondary | ICD-10-CM | POA: Diagnosis present

## 2020-12-28 DIAGNOSIS — I517 Cardiomegaly: Secondary | ICD-10-CM | POA: Diagnosis not present

## 2020-12-28 DIAGNOSIS — E8779 Other fluid overload: Secondary | ICD-10-CM | POA: Diagnosis not present

## 2020-12-28 DIAGNOSIS — I4430 Unspecified atrioventricular block: Secondary | ICD-10-CM | POA: Diagnosis present

## 2020-12-28 DIAGNOSIS — H7291 Unspecified perforation of tympanic membrane, right ear: Secondary | ICD-10-CM | POA: Diagnosis present

## 2020-12-28 DIAGNOSIS — T1490XA Injury, unspecified, initial encounter: Secondary | ICD-10-CM

## 2020-12-28 DIAGNOSIS — I10 Essential (primary) hypertension: Secondary | ICD-10-CM | POA: Diagnosis not present

## 2020-12-28 DIAGNOSIS — R1110 Vomiting, unspecified: Secondary | ICD-10-CM

## 2020-12-28 DIAGNOSIS — Z7984 Long term (current) use of oral hypoglycemic drugs: Secondary | ICD-10-CM

## 2020-12-28 DIAGNOSIS — K5901 Slow transit constipation: Secondary | ICD-10-CM | POA: Diagnosis present

## 2020-12-28 DIAGNOSIS — E8809 Other disorders of plasma-protein metabolism, not elsewhere classified: Secondary | ICD-10-CM | POA: Diagnosis present

## 2020-12-28 DIAGNOSIS — E876 Hypokalemia: Secondary | ICD-10-CM | POA: Diagnosis not present

## 2020-12-28 DIAGNOSIS — S0990XA Unspecified injury of head, initial encounter: Secondary | ICD-10-CM | POA: Diagnosis not present

## 2020-12-28 DIAGNOSIS — R402242 Coma scale, best verbal response, confused conversation, at arrival to emergency department: Secondary | ICD-10-CM | POA: Diagnosis present

## 2020-12-28 DIAGNOSIS — I4892 Unspecified atrial flutter: Secondary | ICD-10-CM | POA: Diagnosis not present

## 2020-12-28 DIAGNOSIS — S0219XA Other fracture of base of skull, initial encounter for closed fracture: Secondary | ICD-10-CM | POA: Diagnosis present

## 2020-12-28 DIAGNOSIS — J811 Chronic pulmonary edema: Secondary | ICD-10-CM | POA: Diagnosis not present

## 2020-12-28 DIAGNOSIS — N170 Acute kidney failure with tubular necrosis: Secondary | ICD-10-CM | POA: Diagnosis not present

## 2020-12-28 DIAGNOSIS — E860 Dehydration: Secondary | ICD-10-CM | POA: Diagnosis not present

## 2020-12-28 DIAGNOSIS — N281 Cyst of kidney, acquired: Secondary | ICD-10-CM | POA: Diagnosis not present

## 2020-12-28 DIAGNOSIS — W109XXA Fall (on) (from) unspecified stairs and steps, initial encounter: Secondary | ICD-10-CM | POA: Diagnosis present

## 2020-12-28 DIAGNOSIS — A4152 Sepsis due to Pseudomonas: Secondary | ICD-10-CM | POA: Diagnosis present

## 2020-12-28 DIAGNOSIS — Y92009 Unspecified place in unspecified non-institutional (private) residence as the place of occurrence of the external cause: Secondary | ICD-10-CM | POA: Diagnosis not present

## 2020-12-28 DIAGNOSIS — Z7982 Long term (current) use of aspirin: Secondary | ICD-10-CM | POA: Diagnosis not present

## 2020-12-28 DIAGNOSIS — E1129 Type 2 diabetes mellitus with other diabetic kidney complication: Secondary | ICD-10-CM | POA: Diagnosis not present

## 2020-12-28 DIAGNOSIS — I951 Orthostatic hypotension: Secondary | ICD-10-CM | POA: Diagnosis not present

## 2020-12-28 DIAGNOSIS — Z9981 Dependence on supplemental oxygen: Secondary | ICD-10-CM | POA: Diagnosis not present

## 2020-12-28 DIAGNOSIS — J9602 Acute respiratory failure with hypercapnia: Secondary | ICD-10-CM | POA: Diagnosis not present

## 2020-12-28 DIAGNOSIS — I82612 Acute embolism and thrombosis of superficial veins of left upper extremity: Secondary | ICD-10-CM | POA: Diagnosis present

## 2020-12-28 DIAGNOSIS — Z20822 Contact with and (suspected) exposure to covid-19: Secondary | ICD-10-CM | POA: Diagnosis present

## 2020-12-28 DIAGNOSIS — Z9911 Dependence on respirator [ventilator] status: Secondary | ICD-10-CM | POA: Diagnosis not present

## 2020-12-28 DIAGNOSIS — I2510 Atherosclerotic heart disease of native coronary artery without angina pectoris: Secondary | ICD-10-CM | POA: Diagnosis not present

## 2020-12-28 DIAGNOSIS — K567 Ileus, unspecified: Secondary | ICD-10-CM

## 2020-12-28 DIAGNOSIS — K529 Noninfective gastroenteritis and colitis, unspecified: Secondary | ICD-10-CM | POA: Diagnosis not present

## 2020-12-28 DIAGNOSIS — Z452 Encounter for adjustment and management of vascular access device: Secondary | ICD-10-CM

## 2020-12-28 DIAGNOSIS — E041 Nontoxic single thyroid nodule: Secondary | ICD-10-CM | POA: Diagnosis present

## 2020-12-28 DIAGNOSIS — R402142 Coma scale, eyes open, spontaneous, at arrival to emergency department: Secondary | ICD-10-CM | POA: Diagnosis present

## 2020-12-28 DIAGNOSIS — E669 Obesity, unspecified: Secondary | ICD-10-CM | POA: Diagnosis present

## 2020-12-28 DIAGNOSIS — R4182 Altered mental status, unspecified: Secondary | ICD-10-CM | POA: Diagnosis not present

## 2020-12-28 DIAGNOSIS — J9600 Acute respiratory failure, unspecified whether with hypoxia or hypercapnia: Secondary | ICD-10-CM | POA: Diagnosis not present

## 2020-12-28 DIAGNOSIS — K6389 Other specified diseases of intestine: Secondary | ICD-10-CM | POA: Diagnosis not present

## 2020-12-28 DIAGNOSIS — N179 Acute kidney failure, unspecified: Secondary | ICD-10-CM

## 2020-12-28 DIAGNOSIS — E871 Hypo-osmolality and hyponatremia: Secondary | ICD-10-CM | POA: Diagnosis present

## 2020-12-28 DIAGNOSIS — E870 Hyperosmolality and hypernatremia: Secondary | ICD-10-CM | POA: Diagnosis not present

## 2020-12-28 DIAGNOSIS — K819 Cholecystitis, unspecified: Secondary | ICD-10-CM

## 2020-12-28 DIAGNOSIS — W19XXXA Unspecified fall, initial encounter: Secondary | ICD-10-CM | POA: Diagnosis not present

## 2020-12-28 DIAGNOSIS — Z0189 Encounter for other specified special examinations: Secondary | ICD-10-CM

## 2020-12-28 DIAGNOSIS — S065X9A Traumatic subdural hemorrhage with loss of consciousness of unspecified duration, initial encounter: Secondary | ICD-10-CM | POA: Diagnosis not present

## 2020-12-28 DIAGNOSIS — S0993XA Unspecified injury of face, initial encounter: Secondary | ICD-10-CM | POA: Diagnosis not present

## 2020-12-28 DIAGNOSIS — M47812 Spondylosis without myelopathy or radiculopathy, cervical region: Secondary | ICD-10-CM | POA: Diagnosis not present

## 2020-12-28 DIAGNOSIS — Z978 Presence of other specified devices: Secondary | ICD-10-CM

## 2020-12-28 DIAGNOSIS — I4891 Unspecified atrial fibrillation: Secondary | ICD-10-CM | POA: Diagnosis not present

## 2020-12-28 DIAGNOSIS — H9221 Otorrhagia, right ear: Secondary | ICD-10-CM | POA: Diagnosis not present

## 2020-12-28 DIAGNOSIS — J151 Pneumonia due to Pseudomonas: Secondary | ICD-10-CM | POA: Diagnosis not present

## 2020-12-28 DIAGNOSIS — E11649 Type 2 diabetes mellitus with hypoglycemia without coma: Secondary | ICD-10-CM | POA: Diagnosis present

## 2020-12-28 DIAGNOSIS — E872 Acidosis, unspecified: Secondary | ICD-10-CM | POA: Diagnosis present

## 2020-12-28 DIAGNOSIS — I700 Atherosclerosis of aorta: Secondary | ICD-10-CM | POA: Diagnosis not present

## 2020-12-28 DIAGNOSIS — I4819 Other persistent atrial fibrillation: Secondary | ICD-10-CM | POA: Diagnosis present

## 2020-12-28 DIAGNOSIS — K5730 Diverticulosis of large intestine without perforation or abscess without bleeding: Secondary | ICD-10-CM | POA: Diagnosis not present

## 2020-12-28 DIAGNOSIS — I959 Hypotension, unspecified: Secondary | ICD-10-CM | POA: Diagnosis not present

## 2020-12-28 DIAGNOSIS — R9431 Abnormal electrocardiogram [ECG] [EKG]: Secondary | ICD-10-CM | POA: Diagnosis not present

## 2020-12-28 DIAGNOSIS — R404 Transient alteration of awareness: Secondary | ICD-10-CM | POA: Diagnosis not present

## 2020-12-28 DIAGNOSIS — Z6833 Body mass index (BMI) 33.0-33.9, adult: Secondary | ICD-10-CM

## 2020-12-28 DIAGNOSIS — Z4901 Encounter for fitting and adjustment of extracorporeal dialysis catheter: Secondary | ICD-10-CM

## 2020-12-28 DIAGNOSIS — J9690 Respiratory failure, unspecified, unspecified whether with hypoxia or hypercapnia: Secondary | ICD-10-CM | POA: Diagnosis not present

## 2020-12-28 DIAGNOSIS — E785 Hyperlipidemia, unspecified: Secondary | ICD-10-CM | POA: Diagnosis not present

## 2020-12-28 DIAGNOSIS — R509 Fever, unspecified: Secondary | ICD-10-CM | POA: Diagnosis not present

## 2020-12-28 DIAGNOSIS — S06310A Contusion and laceration of right cerebrum without loss of consciousness, initial encounter: Secondary | ICD-10-CM | POA: Diagnosis not present

## 2020-12-28 DIAGNOSIS — Z79899 Other long term (current) drug therapy: Secondary | ICD-10-CM

## 2020-12-28 DIAGNOSIS — E1165 Type 2 diabetes mellitus with hyperglycemia: Secondary | ICD-10-CM | POA: Diagnosis not present

## 2020-12-28 DIAGNOSIS — I2699 Other pulmonary embolism without acute cor pulmonale: Secondary | ICD-10-CM | POA: Diagnosis present

## 2020-12-28 DIAGNOSIS — E8770 Fluid overload, unspecified: Secondary | ICD-10-CM | POA: Diagnosis not present

## 2020-12-28 DIAGNOSIS — R402362 Coma scale, best motor response, obeys commands, at arrival to emergency department: Secondary | ICD-10-CM | POA: Diagnosis present

## 2020-12-28 DIAGNOSIS — S0211GA Other fracture of occiput, right side, initial encounter for closed fracture: Secondary | ICD-10-CM | POA: Diagnosis not present

## 2020-12-28 DIAGNOSIS — R6521 Severe sepsis with septic shock: Secondary | ICD-10-CM | POA: Diagnosis not present

## 2020-12-28 DIAGNOSIS — E875 Hyperkalemia: Secondary | ICD-10-CM | POA: Diagnosis not present

## 2020-12-28 DIAGNOSIS — Z515 Encounter for palliative care: Secondary | ICD-10-CM | POA: Diagnosis not present

## 2020-12-28 DIAGNOSIS — Z4682 Encounter for fitting and adjustment of non-vascular catheter: Secondary | ICD-10-CM | POA: Diagnosis not present

## 2020-12-28 DIAGNOSIS — M4802 Spinal stenosis, cervical region: Secondary | ICD-10-CM | POA: Diagnosis not present

## 2020-12-28 DIAGNOSIS — I611 Nontraumatic intracerebral hemorrhage in hemisphere, cortical: Secondary | ICD-10-CM | POA: Diagnosis not present

## 2020-12-28 DIAGNOSIS — S02119A Unspecified fracture of occiput, initial encounter for closed fracture: Secondary | ICD-10-CM | POA: Diagnosis present

## 2020-12-28 DIAGNOSIS — D62 Acute posthemorrhagic anemia: Secondary | ICD-10-CM | POA: Diagnosis present

## 2020-12-28 DIAGNOSIS — I480 Paroxysmal atrial fibrillation: Secondary | ICD-10-CM | POA: Diagnosis not present

## 2020-12-28 DIAGNOSIS — S069X0S Unspecified intracranial injury without loss of consciousness, sequela: Secondary | ICD-10-CM | POA: Diagnosis not present

## 2020-12-28 DIAGNOSIS — R001 Bradycardia, unspecified: Secondary | ICD-10-CM | POA: Diagnosis not present

## 2020-12-28 DIAGNOSIS — R7309 Other abnormal glucose: Secondary | ICD-10-CM | POA: Diagnosis not present

## 2020-12-28 DIAGNOSIS — S060X9A Concussion with loss of consciousness of unspecified duration, initial encounter: Secondary | ICD-10-CM | POA: Diagnosis not present

## 2020-12-28 DIAGNOSIS — Z7189 Other specified counseling: Secondary | ICD-10-CM | POA: Diagnosis not present

## 2020-12-28 DIAGNOSIS — R410 Disorientation, unspecified: Secondary | ICD-10-CM | POA: Diagnosis not present

## 2020-12-28 DIAGNOSIS — S066X9A Traumatic subarachnoid hemorrhage with loss of consciousness of unspecified duration, initial encounter: Principal | ICD-10-CM | POA: Diagnosis present

## 2020-12-28 DIAGNOSIS — J810 Acute pulmonary edema: Secondary | ICD-10-CM | POA: Diagnosis not present

## 2020-12-28 DIAGNOSIS — Z992 Dependence on renal dialysis: Secondary | ICD-10-CM

## 2020-12-28 DIAGNOSIS — S299XXA Unspecified injury of thorax, initial encounter: Secondary | ICD-10-CM | POA: Diagnosis not present

## 2020-12-28 DIAGNOSIS — M47816 Spondylosis without myelopathy or radiculopathy, lumbar region: Secondary | ICD-10-CM | POA: Diagnosis not present

## 2020-12-28 DIAGNOSIS — I609 Nontraumatic subarachnoid hemorrhage, unspecified: Secondary | ICD-10-CM | POA: Diagnosis not present

## 2020-12-28 DIAGNOSIS — R918 Other nonspecific abnormal finding of lung field: Secondary | ICD-10-CM | POA: Diagnosis not present

## 2020-12-28 DIAGNOSIS — T17908A Unspecified foreign body in respiratory tract, part unspecified causing other injury, initial encounter: Secondary | ICD-10-CM

## 2020-12-28 DIAGNOSIS — I471 Supraventricular tachycardia: Secondary | ICD-10-CM | POA: Diagnosis not present

## 2020-12-28 DIAGNOSIS — S066X0A Traumatic subarachnoid hemorrhage without loss of consciousness, initial encounter: Secondary | ICD-10-CM | POA: Diagnosis not present

## 2020-12-28 DIAGNOSIS — R579 Shock, unspecified: Secondary | ICD-10-CM | POA: Diagnosis not present

## 2020-12-28 DIAGNOSIS — I619 Nontraumatic intracerebral hemorrhage, unspecified: Secondary | ICD-10-CM | POA: Diagnosis not present

## 2020-12-28 DIAGNOSIS — Z6838 Body mass index (BMI) 38.0-38.9, adult: Secondary | ICD-10-CM | POA: Diagnosis not present

## 2020-12-28 DIAGNOSIS — I495 Sick sinus syndrome: Secondary | ICD-10-CM | POA: Diagnosis not present

## 2020-12-28 DIAGNOSIS — I2694 Multiple subsegmental pulmonary emboli without acute cor pulmonale: Secondary | ICD-10-CM | POA: Diagnosis not present

## 2020-12-28 DIAGNOSIS — S0291XA Unspecified fracture of skull, initial encounter for closed fracture: Secondary | ICD-10-CM | POA: Diagnosis not present

## 2020-12-28 DIAGNOSIS — S069X0A Unspecified intracranial injury without loss of consciousness, initial encounter: Secondary | ICD-10-CM | POA: Diagnosis not present

## 2020-12-28 DIAGNOSIS — A419 Sepsis, unspecified organism: Secondary | ICD-10-CM | POA: Diagnosis not present

## 2020-12-28 DIAGNOSIS — I491 Atrial premature depolarization: Secondary | ICD-10-CM | POA: Diagnosis not present

## 2020-12-28 DIAGNOSIS — R109 Unspecified abdominal pain: Secondary | ICD-10-CM

## 2020-12-28 DIAGNOSIS — K8020 Calculus of gallbladder without cholecystitis without obstruction: Secondary | ICD-10-CM | POA: Diagnosis not present

## 2020-12-28 DIAGNOSIS — R339 Retention of urine, unspecified: Secondary | ICD-10-CM | POA: Diagnosis not present

## 2020-12-28 DIAGNOSIS — S3991XA Unspecified injury of abdomen, initial encounter: Secondary | ICD-10-CM | POA: Diagnosis not present

## 2020-12-28 DIAGNOSIS — R456 Violent behavior: Secondary | ICD-10-CM | POA: Diagnosis not present

## 2020-12-28 DIAGNOSIS — R059 Cough, unspecified: Secondary | ICD-10-CM

## 2020-12-28 DIAGNOSIS — I509 Heart failure, unspecified: Secondary | ICD-10-CM | POA: Diagnosis not present

## 2020-12-28 DIAGNOSIS — Z86711 Personal history of pulmonary embolism: Secondary | ICD-10-CM | POA: Diagnosis not present

## 2020-12-28 DIAGNOSIS — J90 Pleural effusion, not elsewhere classified: Secondary | ICD-10-CM | POA: Diagnosis not present

## 2020-12-28 DIAGNOSIS — S3993XA Unspecified injury of pelvis, initial encounter: Secondary | ICD-10-CM | POA: Diagnosis not present

## 2020-12-28 DIAGNOSIS — J9811 Atelectasis: Secondary | ICD-10-CM | POA: Diagnosis not present

## 2020-12-28 DIAGNOSIS — R4020 Unspecified coma: Secondary | ICD-10-CM | POA: Diagnosis not present

## 2020-12-28 DIAGNOSIS — E162 Hypoglycemia, unspecified: Secondary | ICD-10-CM | POA: Diagnosis not present

## 2020-12-28 DIAGNOSIS — Z4659 Encounter for fitting and adjustment of other gastrointestinal appliance and device: Secondary | ICD-10-CM

## 2020-12-28 DIAGNOSIS — M5021 Other cervical disc displacement,  high cervical region: Secondary | ICD-10-CM | POA: Diagnosis not present

## 2020-12-28 DIAGNOSIS — E1159 Type 2 diabetes mellitus with other circulatory complications: Secondary | ICD-10-CM | POA: Diagnosis not present

## 2020-12-28 DIAGNOSIS — E119 Type 2 diabetes mellitus without complications: Secondary | ICD-10-CM | POA: Diagnosis not present

## 2020-12-28 DIAGNOSIS — S0219XD Other fracture of base of skull, subsequent encounter for fracture with routine healing: Secondary | ICD-10-CM | POA: Diagnosis not present

## 2020-12-28 DIAGNOSIS — Z794 Long term (current) use of insulin: Secondary | ICD-10-CM | POA: Diagnosis not present

## 2020-12-28 DIAGNOSIS — I152 Hypertension secondary to endocrine disorders: Secondary | ICD-10-CM | POA: Diagnosis not present

## 2020-12-28 DIAGNOSIS — S069X0D Unspecified intracranial injury without loss of consciousness, subsequent encounter: Secondary | ICD-10-CM | POA: Diagnosis not present

## 2020-12-28 HISTORY — DX: Essential (primary) hypertension: I10

## 2020-12-28 HISTORY — DX: Hyperlipidemia, unspecified: E78.5

## 2020-12-28 HISTORY — DX: Type 2 diabetes mellitus without complications: E11.9

## 2020-12-28 LAB — COMPREHENSIVE METABOLIC PANEL
ALT: 27 U/L (ref 0–44)
AST: 33 U/L (ref 15–41)
Albumin: 3.5 g/dL (ref 3.5–5.0)
Alkaline Phosphatase: 66 U/L (ref 38–126)
Anion gap: 13 (ref 5–15)
BUN: 16 mg/dL (ref 8–23)
CO2: 21 mmol/L — ABNORMAL LOW (ref 22–32)
Calcium: 9.1 mg/dL (ref 8.9–10.3)
Chloride: 105 mmol/L (ref 98–111)
Creatinine, Ser: 0.91 mg/dL (ref 0.61–1.24)
GFR, Estimated: >60 mL/min (ref >60)
Glucose, Bld: 166 mg/dL — ABNORMAL HIGH (ref 70–99)
Potassium: 3.3 mmol/L — ABNORMAL LOW (ref 3.5–5.1)
Sodium: 139 mmol/L (ref 135–145)
Total Bilirubin: 0.5 mg/dL (ref 0.3–1.2)
Total Protein: 6.5 g/dL (ref 6.5–8.1)

## 2020-12-28 LAB — CBC
HCT: 34.1 % — ABNORMAL LOW (ref 39.0–52.0)
Hemoglobin: 10.8 g/dL — ABNORMAL LOW (ref 13.0–17.0)
MCH: 25.8 pg — ABNORMAL LOW (ref 26.0–34.0)
MCHC: 31.7 g/dL (ref 30.0–36.0)
MCV: 81.6 fL (ref 80.0–100.0)
Platelets: 196 10*3/uL (ref 150–400)
RBC: 4.18 MIL/uL — ABNORMAL LOW (ref 4.22–5.81)
RDW: 15.5 % (ref 11.5–15.5)
WBC: 7.6 10*3/uL (ref 4.0–10.5)
nRBC: 0 % (ref 0.0–0.2)

## 2020-12-28 LAB — I-STAT CHEM 8, ED
BUN: 16 mg/dL (ref 8–23)
Calcium, Ion: 1.19 mmol/L (ref 1.15–1.40)
Chloride: 102 mmol/L (ref 98–111)
Creatinine, Ser: 0.8 mg/dL (ref 0.61–1.24)
Glucose, Bld: 165 mg/dL — ABNORMAL HIGH (ref 70–99)
HCT: 34 % — ABNORMAL LOW (ref 39.0–52.0)
Hemoglobin: 11.6 g/dL — ABNORMAL LOW (ref 13.0–17.0)
Potassium: 3.4 mmol/L — ABNORMAL LOW (ref 3.5–5.1)
Sodium: 141 mmol/L (ref 135–145)
TCO2: 21 mmol/L — ABNORMAL LOW (ref 22–32)

## 2020-12-28 LAB — URINALYSIS, ROUTINE W REFLEX MICROSCOPIC
Bacteria, UA: NONE SEEN
Bilirubin Urine: NEGATIVE
Glucose, UA: NEGATIVE mg/dL
Ketones, ur: 5 mg/dL — AB
Leukocytes,Ua: NEGATIVE
Nitrite: NEGATIVE
Protein, ur: 100 mg/dL — AB
Specific Gravity, Urine: >1.046 — ABNORMAL HIGH (ref 1.005–1.030)
pH: 5 (ref 5.0–8.0)

## 2020-12-28 LAB — RESP PANEL BY RT-PCR (FLU A&B, COVID) ARPGX2
Influenza A by PCR: NEGATIVE
Influenza B by PCR: NEGATIVE
SARS Coronavirus 2 by RT PCR: NEGATIVE

## 2020-12-28 LAB — I-STAT ARTERIAL BLOOD GAS, ED
Acid-Base Excess: 1 mmol/L (ref 0.0–2.0)
Bicarbonate: 27.6 mmol/L (ref 20.0–28.0)
Calcium, Ion: 1.26 mmol/L (ref 1.15–1.40)
HCT: 33 % — ABNORMAL LOW (ref 39.0–52.0)
Hemoglobin: 11.2 g/dL — ABNORMAL LOW (ref 13.0–17.0)
O2 Saturation: 100 %
Potassium: 3.6 mmol/L (ref 3.5–5.1)
Sodium: 139 mmol/L (ref 135–145)
TCO2: 29 mmol/L (ref 22–32)
pCO2 arterial: 51.4 mmHg — ABNORMAL HIGH (ref 32.0–48.0)
pH, Arterial: 7.339 — ABNORMAL LOW (ref 7.350–7.450)
pO2, Arterial: 366 mmHg — ABNORMAL HIGH (ref 83.0–108.0)

## 2020-12-28 LAB — GLUCOSE, CAPILLARY
Glucose-Capillary: 140 mg/dL — ABNORMAL HIGH (ref 70–99)
Glucose-Capillary: 208 mg/dL — ABNORMAL HIGH (ref 70–99)

## 2020-12-28 LAB — PROTIME-INR
INR: 1.1 (ref 0.8–1.2)
Prothrombin Time: 14 seconds (ref 11.4–15.2)

## 2020-12-28 LAB — SAMPLE TO BLOOD BANK

## 2020-12-28 LAB — ETHANOL: Alcohol, Ethyl (B): <10 mg/dL (ref <10)

## 2020-12-28 LAB — HIV ANTIBODY (ROUTINE TESTING W REFLEX): HIV Screen 4th Generation wRfx: NONREACTIVE

## 2020-12-28 LAB — LACTIC ACID, PLASMA: Lactic Acid, Venous: 4 mmol/L (ref 0.5–1.9)

## 2020-12-28 MED ORDER — LABETALOL HCL 5 MG/ML IV SOLN
10.0000 mg | INTRAVENOUS | Status: DC | PRN
  Administered 2021-01-01 (×3): 20 mg via INTRAVENOUS
  Administered 2021-01-02 (×2): 10 mg via INTRAVENOUS
  Administered 2021-01-02 – 2021-01-03 (×8): 20 mg via INTRAVENOUS
  Filled 2020-12-28 (×13): qty 4

## 2020-12-28 MED ORDER — PROPOFOL 1000 MG/100ML IV EMUL
0.0000 ug/kg/min | INTRAVENOUS | Status: AC
Start: 2020-12-28 — End: 2021-01-01
  Administered 2020-12-28 (×2): 40 ug/kg/min via INTRAVENOUS
  Administered 2020-12-28 – 2020-12-29 (×2): 20 ug/kg/min via INTRAVENOUS
  Administered 2020-12-29: 40 ug/kg/min via INTRAVENOUS
  Administered 2020-12-29: 35 ug/kg/min via INTRAVENOUS
  Administered 2020-12-29 – 2020-12-30 (×7): 20 ug/kg/min via INTRAVENOUS
  Administered 2020-12-31: 10 ug/kg/min via INTRAVENOUS
  Administered 2020-12-31: 20 ug/kg/min via INTRAVENOUS
  Filled 2020-12-28 (×16): qty 100

## 2020-12-28 MED ORDER — ORAL CARE MOUTH RINSE
15.0000 mL | OROMUCOSAL | Status: DC
  Administered 2020-12-29 – 2021-02-03 (×360): 15 mL via OROMUCOSAL

## 2020-12-28 MED ORDER — PROPOFOL 1000 MG/100ML IV EMUL
5.0000 ug/kg/min | INTRAVENOUS | Status: DC
  Administered 2020-12-28: 10 ug/kg/min via INTRAVENOUS

## 2020-12-28 MED ORDER — FENTANYL CITRATE PF 50 MCG/ML IJ SOSY
PREFILLED_SYRINGE | INTRAMUSCULAR | Status: AC
  Administered 2020-12-28: 50 ug
  Filled 2020-12-28: qty 1

## 2020-12-28 MED ORDER — IOHEXOL 350 MG/ML SOLN
100.0000 mL | Freq: Once | INTRAVENOUS | Status: AC | PRN
  Administered 2020-12-28: 100 mL via INTRAVENOUS

## 2020-12-28 MED ORDER — LEVETIRACETAM IN NACL 500 MG/100ML IV SOLN
500.0000 mg | Freq: Two times a day (BID) | INTRAVENOUS | Status: DC
  Administered 2020-12-28 – 2020-12-31 (×6): 500 mg via INTRAVENOUS
  Filled 2020-12-28 (×7): qty 100

## 2020-12-28 MED ORDER — DOCUSATE SODIUM 50 MG/5ML PO LIQD
100.0000 mg | Freq: Two times a day (BID) | ORAL | Status: DC
  Administered 2020-12-28 – 2021-02-10 (×56): 100 mg
  Filled 2020-12-28 (×65): qty 10

## 2020-12-28 MED ORDER — ONDANSETRON 4 MG PO TBDP: 4.0000 mg | ORAL_TABLET | Freq: Four times a day (QID) | ORAL | Status: DC | PRN

## 2020-12-28 MED ORDER — ACETAMINOPHEN 500 MG PO TABS
1000.0000 mg | ORAL_TABLET | Freq: Four times a day (QID) | ORAL | Status: DC
  Administered 2020-12-28 – 2021-02-12 (×163): 1000 mg
  Filled 2020-12-28 (×169): qty 2

## 2020-12-28 MED ORDER — POLYETHYLENE GLYCOL 3350 17 G PO PACK
17.0000 g | PACK | Freq: Every day | ORAL | Status: DC
  Administered 2020-12-29 – 2021-02-10 (×25): 17 g
  Filled 2020-12-28 (×28): qty 1

## 2020-12-28 MED ORDER — FENTANYL CITRATE PF 50 MCG/ML IJ SOSY
25.0000 ug | PREFILLED_SYRINGE | Freq: Once | INTRAMUSCULAR | Status: AC
  Administered 2020-12-28: 25 ug via INTRAVENOUS

## 2020-12-28 MED ORDER — INSULIN ASPART 100 UNIT/ML IJ SOLN
0.0000 [IU] | INTRAMUSCULAR | Status: DC
  Administered 2020-12-28: 5 [IU] via SUBCUTANEOUS
  Administered 2020-12-28 – 2020-12-29 (×2): 2 [IU] via SUBCUTANEOUS
  Administered 2020-12-29: 3 [IU] via SUBCUTANEOUS
  Administered 2020-12-30 (×2): 2 [IU] via SUBCUTANEOUS
  Administered 2020-12-31 (×4): 3 [IU] via SUBCUTANEOUS
  Administered 2020-12-31: 2 [IU] via SUBCUTANEOUS
  Administered 2021-01-01 (×2): 5 [IU] via SUBCUTANEOUS
  Administered 2021-01-01: 3 [IU] via SUBCUTANEOUS
  Administered 2021-01-01 (×2): 5 [IU] via SUBCUTANEOUS
  Administered 2021-01-01 – 2021-01-02 (×2): 8 [IU] via SUBCUTANEOUS
  Administered 2021-01-02: 5 [IU] via SUBCUTANEOUS
  Administered 2021-01-02: 11 [IU] via SUBCUTANEOUS
  Administered 2021-01-02: 8 [IU] via SUBCUTANEOUS
  Administered 2021-01-02 – 2021-01-03 (×3): 11 [IU] via SUBCUTANEOUS
  Administered 2021-01-03 (×4): 8 [IU] via SUBCUTANEOUS
  Administered 2021-01-04: 11 [IU] via SUBCUTANEOUS
  Administered 2021-01-04: 8 [IU] via SUBCUTANEOUS
  Administered 2021-01-04: 11 [IU] via SUBCUTANEOUS
  Administered 2021-01-04 (×3): 8 [IU] via SUBCUTANEOUS
  Administered 2021-01-04: 11 [IU] via SUBCUTANEOUS
  Administered 2021-01-05 (×4): 8 [IU] via SUBCUTANEOUS
  Administered 2021-01-05: 3 [IU] via SUBCUTANEOUS
  Administered 2021-01-05: 8 [IU] via SUBCUTANEOUS
  Administered 2021-01-06: 5 [IU] via SUBCUTANEOUS
  Administered 2021-01-06: 8 [IU] via SUBCUTANEOUS
  Administered 2021-01-06 (×2): 5 [IU] via SUBCUTANEOUS
  Administered 2021-01-06 – 2021-01-07 (×5): 8 [IU] via SUBCUTANEOUS
  Administered 2021-01-07: 5 [IU] via SUBCUTANEOUS
  Administered 2021-01-07 (×2): 8 [IU] via SUBCUTANEOUS
  Administered 2021-01-08: 3 [IU] via SUBCUTANEOUS
  Administered 2021-01-08: 5 [IU] via SUBCUTANEOUS

## 2020-12-28 MED ORDER — ROCURONIUM BROMIDE 50 MG/5ML IV SOLN
INTRAVENOUS | Status: AC | PRN
  Administered 2020-12-28: 100 mg via INTRAVENOUS

## 2020-12-28 MED ORDER — ONDANSETRON HCL 4 MG/2ML IJ SOLN
4.0000 mg | Freq: Four times a day (QID) | INTRAMUSCULAR | Status: DC | PRN
  Administered 2021-01-11 – 2021-02-24 (×3): 4 mg via INTRAVENOUS
  Filled 2020-12-28 (×3): qty 2

## 2020-12-28 MED ORDER — CLEVIDIPINE BUTYRATE 0.5 MG/ML IV EMUL: 0.0000 mg/h | INTRAVENOUS | Status: DC

## 2020-12-28 MED ORDER — CHLORHEXIDINE GLUCONATE 0.12% ORAL RINSE (MEDLINE KIT)
15.0000 mL | Freq: Two times a day (BID) | OROMUCOSAL | Status: DC
  Administered 2020-12-29 – 2021-02-02 (×71): 15 mL via OROMUCOSAL

## 2020-12-28 MED ORDER — ONDANSETRON HCL 4 MG/2ML IJ SOLN
4.0000 mg | Freq: Once | INTRAMUSCULAR | Status: AC
  Administered 2020-12-28: 4 mg via INTRAVENOUS

## 2020-12-28 MED ORDER — FENTANYL 2500MCG IN NS 250ML (10MCG/ML) PREMIX INFUSION
25.0000 ug/h | INTRAVENOUS | Status: DC
  Administered 2020-12-28: 100 ug/h via INTRAVENOUS
  Administered 2020-12-29: 180 ug/h via INTRAVENOUS
  Administered 2020-12-30 – 2020-12-31 (×2): 75 ug/h via INTRAVENOUS
  Administered 2021-01-01: 100 ug/h via INTRAVENOUS
  Administered 2021-01-02: 150 ug/h via INTRAVENOUS
  Administered 2021-01-02: 175 ug/h via INTRAVENOUS
  Administered 2021-01-03: 75 ug/h via INTRAVENOUS
  Administered 2021-01-04 (×2): 175 ug/h via INTRAVENOUS
  Administered 2021-01-05 – 2021-01-07 (×5): 200 ug/h via INTRAVENOUS
  Administered 2021-01-08: 150 ug/h via INTRAVENOUS
  Administered 2021-01-09: 100 ug/h via INTRAVENOUS
  Administered 2021-01-09: 175 ug/h via INTRAVENOUS
  Administered 2021-01-10: 300 ug/h via INTRAVENOUS
  Administered 2021-01-10 – 2021-01-11 (×2): 200 ug/h via INTRAVENOUS
  Filled 2020-12-28 (×22): qty 250

## 2020-12-28 MED ORDER — FENTANYL BOLUS VIA INFUSION
25.0000 ug | INTRAVENOUS | Status: DC | PRN
  Administered 2021-01-03: 50 ug via INTRAVENOUS
  Administered 2021-01-04 – 2021-01-06 (×13): 100 ug via INTRAVENOUS
  Administered 2021-01-07 (×2): 50 ug via INTRAVENOUS
  Administered 2021-01-08: 75 ug via INTRAVENOUS
  Administered 2021-01-08: 50 ug via INTRAVENOUS
  Administered 2021-01-08 (×2): 75 ug via INTRAVENOUS
  Administered 2021-01-09 – 2021-01-10 (×2): 50 ug via INTRAVENOUS
  Filled 2020-12-28: qty 100

## 2020-12-28 MED ORDER — OXYCODONE HCL 5 MG PO TABS
5.0000 mg | ORAL_TABLET | ORAL | Status: DC | PRN
  Administered 2020-12-31 – 2021-01-04 (×19): 10 mg
  Administered 2021-01-04: 5 mg
  Administered 2021-01-04: 10 mg
  Administered 2021-01-05 (×3): 5 mg
  Administered 2021-01-06 (×2): 10 mg
  Administered 2021-01-06: 5 mg
  Administered 2021-01-08 – 2021-01-16 (×8): 10 mg
  Administered 2021-01-17: 5 mg
  Administered 2021-01-17: 10 mg
  Filled 2020-12-28 (×15): qty 2
  Filled 2020-12-28: qty 1
  Filled 2020-12-28: qty 2
  Filled 2020-12-28: qty 1
  Filled 2020-12-28 (×4): qty 2
  Filled 2020-12-28: qty 1
  Filled 2020-12-28 (×3): qty 2
  Filled 2020-12-28: qty 1
  Filled 2020-12-28 (×9): qty 2
  Filled 2020-12-28: qty 1

## 2020-12-28 MED ORDER — METHOCARBAMOL 500 MG PO TABS
1000.0000 mg | ORAL_TABLET | Freq: Three times a day (TID) | ORAL | Status: DC
  Administered 2020-12-28 – 2021-02-12 (×127): 1000 mg
  Filled 2020-12-28 (×129): qty 2

## 2020-12-28 MED ORDER — ETOMIDATE 2 MG/ML IV SOLN
INTRAVENOUS | Status: AC | PRN
Start: 2020-12-28 — End: 2020-12-28
  Administered 2020-12-28: 30 mg via INTRAVENOUS

## 2020-12-28 MED ORDER — PANTOPRAZOLE SODIUM 40 MG PO PACK
40.0000 mg | PACK | Freq: Every day | ORAL | Status: DC
  Administered 2020-12-29 – 2021-01-31 (×31): 40 mg
  Filled 2020-12-28 (×32): qty 20

## 2020-12-28 MED ORDER — SODIUM CHLORIDE 0.9 % IV SOLN: INTRAVENOUS | Status: DC

## 2020-12-28 MED ORDER — PANTOPRAZOLE SODIUM 40 MG IV SOLR: 40.0000 mg | Freq: Every day | INTRAVENOUS | Status: DC

## 2020-12-28 NOTE — ED Provider Notes (Signed)
[facility] EMERGENCY DEPARTMENT Provider Note   CSN: [id number] Arrival date & time: 12/28/20  1604     History CC:  Fall, head injury   Angel [name] is a 70 y.o. male presenting the emergency department with a suspected fall downstairs.  EMS reports that the patient was found at the bottom is making steps, approximately 8 steps, with the handle of the stairs broken near the top.  He was bleeding from the right ear.  He was confused on their arrival.  Is not clear long he was unconscious.  He was combative by EMS.  He was disoriented.  He reportedly is not on blood thinners.  He lives with his wife who is in route to the hospital.  On arrival the patient is confused.  He only intermittently follows commands.  He complains of nausea.  He reports a headache  HPI     No past medical history on file.  There are no problems to display for this patient.    No family history on file.     Home Medications Prior to Admission medications   Not on File    Allergies    Patient has no allergy information on record.  Review of Systems   Review of Systems  Unable to perform ROS: Mental status change (level 5 caveat)   Physical Exam Updated Vital Signs BP (!) 190/86   Pulse 82   Temp (!) 96.8 F (36 C) (Temporal)   Resp (!) 34   Ht 6\' 2"  (1.88 m)   Wt 133.8 kg   SpO2 97%   BMI 37.88 kg/m   Physical Exam HENT:     Head: Normocephalic.     Comments: Ruptured TM right ear, bloody drainage from ear Eyes:     Conjunctiva/sclera: Conjunctivae normal.     Pupils: Pupils are equal, round, and reactive to light.  Neck:     Comments: C spine collar in place Cardiovascular:     Rate and Rhythm: Normal rate and regular rhythm.  Pulmonary:     Effort: Pulmonary effort is normal. No respiratory distress.  Abdominal:     General: There is no distension.     Tenderness: There is no abdominal tenderness.  Skin:    General: Skin is warm and dry.  Neurological:      Mental Status: He is alert.     GCS: GCS eye subscore is 4. GCS verbal subscore is 4. GCS motor subscore is 5.    ED Results / Procedures / Treatments   Labs (all labs ordered are listed, but only abnormal results are displayed) Labs Reviewed  RESP PANEL BY RT-PCR (FLU A&B, COVID) ARPGX2  COMPREHENSIVE METABOLIC PANEL  CBC  ETHANOL  URINALYSIS, ROUTINE W REFLEX MICROSCOPIC  LACTIC ACID, PLASMA  PROTIME-INR  I-STAT CHEM 8, ED  SAMPLE TO BLOOD BANK    EKG None  Radiology No results found.  Procedures .Critical Care  Date/Time: 12/29/2020 12:26 AM Performed by: Wyvonnia Dusky, MD Authorized by: Wyvonnia Dusky, MD   Critical care provider statement:    Critical care time (minutes):  45   Critical care time was exclusive of:  Separately billable procedures and treating other patients   Critical care was necessary to treat or prevent imminent or life-threatening deterioration of the following conditions:  CNS failure or compromise   Critical care was time spent personally by me on the following activities:  Discussions with consultants, evaluation of patient's response to  treatment, examination of patient, ordering and performing treatments and interventions, ordering and review of laboratory studies, ordering and review of radiographic studies, pulse oximetry, re-evaluation of patient's condition, obtaining history from patient or surrogate and review of old charts Procedure Name: Intubation Date/Time: 12/29/2020 12:26 AM Performed by: Wyvonnia Dusky, MD Pre-anesthesia Checklist: Patient identified, Patient being monitored, Emergency Drugs available, Timeout performed and Suction available Oxygen Delivery Method: Simple face mask Preoxygenation: Pre-oxygenation with 100% oxygen Induction Type: Rapid sequence Ventilation: Mask ventilation without difficulty Laryngoscope Size: Glidescope and 4 Tube size: 8.0 mm Number of attempts: 1 Airway Equipment and Method:  Video-laryngoscopy Placement Confirmation: ETT inserted through vocal cords under direct vision, CO2 detector and Breath sounds checked- equal and bilateral Secured at: 25 cm Tube secured with: ETT holder      Medications Ordered in ED Medications  fentaNYL (SUBLIMAZE) 50 MCG/ML injection (has no administration in time range)  etomidate (AMIDATE) injection (30 mg Intravenous Given 12/28/20 1614)  rocuronium (ZEMURON) injection (100 mg Intravenous Given 12/28/20 1615)  ondansetron (ZOFRAN) injection 4 mg (4 mg Intravenous Given 12/28/20 1608)    ED Course  I have reviewed the triage vital signs and the nursing notes.  Pertinent labs & imaging results that were available during my care of the patient were reviewed by me and considered in my medical decision making (see chart for details).  Level 1 trauma - suspected head injury, arrives with confusion, hemotympanum, vomiting.  Patient is encephalopathic on arrival, fighting staff and attempting to remove C-spine collar.  This is a high risk situation given concerns for brain bleed and possible spinal injury.  Given our high clinical concern for intracranial injury or bleed, and his high aspiration risk, the decision was made to intubate the patient for airway protection and to ensure spinal stability and emergent medical management.  See RSI note.  Patient taken to CT with trauma service, under the care primarily of trauma MD.  His wife was not present immediately in the ED but reportedly en route by EMS report.    Clinical Course as of 12/29/20 0024  Fri Dec 28, 2020  1622 Intubation 8.0 ETT, patient to CT with trauma surgeon and team. [MT]    Clinical Course User Index [MT] Langston Masker Carola Rhine, MD    Final Clinical Impression(s) / ED Diagnoses Final diagnoses:  Trauma    Rx / DC Orders ED Discharge Orders     None        Jojuan Champney, Carola Rhine, MD 12/29/20 [phone]

## 2020-12-28 NOTE — Consult Note (Signed)
Providing Compassionate, Quality Care - Together   Reason for Consult: Head trauma Referring Physician: Dr. Mariam Dollar Angel [name] is an 70 y.o. male.  HPI: Patient is a 70 year old obese male with unknown history. By report, he is not on blood thinners. He was found down at the bottom of about 8 steps, with the handle broke near the top. He was confused on arrival and could not tolerate lying flat. He was intubated for airway protection. CT head revealed subarachnoid hemorrhage over the posterior right temporal lobe and parietal lobe. There was additional extra-axial hemorrhage over the right convexity. Areas of subarachnoid hemorrhage in the anterior frontal lobes bilaterally, with blood along the falx were also noted The patient sustained a minimally displaced right occipital skull fracture and a longitudinal right temporal bone fracture that extends through the right middle ear cavity. Neurosurgery was consulted for further evaluation and recommendations.  No past medical history on file.  No family history on file.  Social History:  has no history on file for tobacco use, alcohol use, and drug use.  Allergies: Not on File  Medications: List of PTA medications not available at the time of this exam.  Results for orders placed or performed during the hospital encounter of 12/28/20 (from the past 48 hour(s))  Sample to Blood Bank     Status: None   Collection Time: 12/28/20  4:05 PM  Result Value Ref Range   Blood Bank Specimen SAMPLE AVAILABLE FOR TESTING    Sample Expiration      12/29/2020,2359 Performed at [facility], [address] [place], [place] [address]   Comprehensive metabolic panel     Status: Abnormal   Collection Time: 12/28/20  4:07 PM  Result Value Ref Range   Sodium 139 135 - 145 mmol/L   Potassium 3.3 (L) 3.5 - 5.1 mmol/L   Chloride 105 98 - 111 mmol/L   CO2 21 (L) 22 - 32 mmol/L   Glucose, Bld 166 (H) 70 - 99 mg/dL    Comment: Glucose reference  range applies only to samples taken after fasting for at least 8 hours.   BUN 16 8 - 23 mg/dL   Creatinine, Ser 0.91 0.61 - 1.24 mg/dL   Calcium 9.1 8.9 - 10.3 mg/dL   Total Protein 6.5 6.5 - 8.1 g/dL   Albumin 3.5 3.5 - 5.0 g/dL   AST 33 15 - 41 U/L   ALT 27 0 - 44 U/L   Alkaline Phosphatase 66 38 - 126 U/L   Total Bilirubin 0.5 0.3 - 1.2 mg/dL   GFR, Estimated >60 >60 mL/min    Comment: (NOTE) Calculated using the CKD-EPI Creatinine Equation (2021)    Anion gap 13 5 - 15    Comment: Performed at [facility] [address] [place], [place] [address]  CBC     Status: Abnormal   Collection Time: 12/28/20  4:07 PM  Result Value Ref Range   WBC 7.6 4.0 - 10.5 K/uL   RBC 4.18 (L) 4.22 - 5.81 MIL/uL   Hemoglobin 10.8 (L) 13.0 - 17.0 g/dL   HCT 34.1 (L) 39.0 - 52.0 %   MCV 81.6 80.0 - 100.0 fL   MCH 25.8 (L) 26.0 - 34.0 pg   MCHC 31.7 30.0 - 36.0 g/dL   RDW 15.5 11.5 - 15.5 %   Platelets 196 150 - 400 K/uL   nRBC 0.0 0.0 - 0.2 %    Comment: Performed  at [facility], [place] [address] [place], [place] [address]  Ethanol     Status: None   Collection Time: 12/28/20  4:07 PM  Result Value Ref Range   Alcohol, Ethyl (B) <10 <10 mg/dL    Comment: (NOTE) Lowest detectable limit for serum alcohol is 10 mg/dL.  For medical purposes only. Performed at [facility], [place] [address], [place], [place] [address]   Lactic acid, plasma     Status: Abnormal   Collection Time: 12/28/20  4:07 PM  Result Value Ref Range   Lactic Acid, Venous 4.0 (HH) 0.5 - 1.9 mmol/L    Comment: CRITICAL RESULT CALLED TO, READ BACK BY AND VERIFIED WITHSammie Bench RN [phone] K FORSYTH Performed at [facility] [address], [place], [place] [address]   Protime-INR     Status: None   Collection Time: 12/28/20  4:07 PM  Result Value Ref Range   Prothrombin Time 14.0 11.4 - 15.2 seconds   INR 1.1 0.8 - 1.2    Comment: (NOTE) INR goal varies based on device and disease  states. Performed at [facility], [place] [address], [place],  [address]   I-Stat Chem 8, ED     Status: Abnormal   Collection Time: 12/28/20  4:16 PM  Result Value Ref Range   Sodium 141 135 - 145 mmol/L   Potassium 3.4 (L) 3.5 - 5.1 mmol/L   Chloride 102 98 - 111 mmol/L   BUN 16 8 - 23 mg/dL   Creatinine, Ser 0.80 0.61 - 1.24 mg/dL   Glucose, Bld 165 (H) 70 - 99 mg/dL    Comment: Glucose reference range applies only to samples taken after fasting for at least 8 hours.   Calcium, Ion 1.19 1.15 - 1.40 mmol/L   TCO2 21 (L) 22 - 32 mmol/L   Hemoglobin 11.6 (L) 13.0 - 17.0 g/dL   HCT 34.0 (L) 39.0 - 52.0 %  Resp Panel by RT-PCR (Flu A&B, Covid) Nasopharyngeal Swab     Status: None   Collection Time: 12/28/20  4:17 PM   Specimen: Nasopharyngeal Swab; Nasopharyngeal(NP) swabs in vial transport medium  Result Value Ref Range   SARS Coronavirus 2 by RT PCR NEGATIVE NEGATIVE    Comment: (NOTE) SARS-CoV-2 target nucleic acids are NOT DETECTED.  The SARS-CoV-2 RNA is generally detectable in upper respiratory specimens during the acute phase of infection. The lowest concentration of SARS-CoV-2 viral copies this assay can detect is 138 copies/mL. A negative result does not preclude SARS-Cov-2 infection and should not be used as the sole basis for treatment or other patient management decisions. A negative result may occur with  improper specimen collection/handling, submission of specimen other than nasopharyngeal swab, presence of viral mutation(s) within the areas targeted by this assay, and inadequate number of viral copies(<138 copies/mL). A negative result must be combined with clinical observations, patient history, and epidemiological information. The expected result is Negative.  Fact Sheet for Patients:  EntrepreneurPulse.com.au  Fact Sheet for Healthcare Providers:  IncredibleEmployment.be  This test is no t yet approved or  cleared by the [place] FDA and  has been authorized for detection and/or diagnosis of SARS-CoV-2 by FDA under an Emergency Use Authorization (EUA). This EUA will remain  in effect (meaning this test can be used) for the duration of the COVID-19 declaration under Section 564(b)(1) of the Act, 21 U.S.C.section 360bbb-3(b)(1), unless the authorization is terminated  or revoked sooner.  Influenza A by PCR NEGATIVE NEGATIVE   Influenza B by PCR NEGATIVE NEGATIVE    Comment: (NOTE) The Xpert Xpress SARS-CoV-2/FLU/RSV plus assay is intended as an aid in the diagnosis of influenza from Nasopharyngeal swab specimens and should not be used as a sole basis for treatment. Nasal washings and aspirates are unacceptable for Xpert Xpress SARS-CoV-2/FLU/RSV testing.  Fact Sheet for Patients: EntrepreneurPulse.com.au  Fact Sheet for Healthcare Providers: IncredibleEmployment.be  This test is not yet approved or cleared by the [place] FDA and has been authorized for detection and/or diagnosis of SARS-CoV-2 by FDA under an Emergency Use Authorization (EUA). This EUA will remain in effect (meaning this test can be used) for the duration of the COVID-19 declaration under Section 564(b)(1) of the Act, 21 U.S.C. section 360bbb-3(b)(1), unless the authorization is terminated or revoked.  Performed at [facility], [place] [address] [place],  [address]   I-Stat arterial blood gas, ED     Status: Abnormal   Collection Time: 12/28/20  5:46 PM  Result Value Ref Range   pH, Arterial 7.339 (L) 7.350 - 7.450   pCO2 arterial 51.4 (H) 32.0 - 48.0 mmHg   pO2, Arterial 366 (H) 83.0 - 108.0 mmHg   Bicarbonate 27.6 20.0 - 28.0 mmol/L   TCO2 29 22 - 32 mmol/L   O2 Saturation 100.0 %   Acid-Base Excess 1.0 0.0 - 2.0 mmol/L   Sodium 139 135 - 145 mmol/L   Potassium 3.6 3.5 - 5.1 mmol/L   Calcium, Ion 1.26 1.15 - 1.40 mmol/L   HCT 33.0 (L) 39.0 -  52.0 %   Hemoglobin 11.2 (L) 13.0 - 17.0 g/dL   Collection site Radial    Drawn by RT    Sample type ARTERIAL     CT HEAD WO CONTRAST  Result Date: 12/28/2020 CLINICAL DATA:  Level 1 trauma. Fall down 8 stairs. Positive loss of consciousness. Bleeding from the right ear. EXAM: CT HEAD WITHOUT CONTRAST TECHNIQUE: Contiguous axial images were obtained from the base of the skull through the vertex without intravenous contrast. COMPARISON:  None. FINDINGS: Brain: Subarachnoid hemorrhage is present over the posterior right temporal lobe and parietal lobe. Additional extra-axial hemorrhage is present over the right convexity. Areas of subarachnoid hemorrhage are present in the anterior frontal lobes bilaterally with blood along the falx likely subdural. No parenchymal hemorrhage is present. No acute infarct is present. No mass lesion. Ventricles are of normal size. The brainstem and cerebellum are within normal limits. Vascular: Atherosclerotic calcifications are present within the cavernous internal carotid arteries. No hyperdense vessel is present. Skull: Minimally displaced right occipital skull fracture is present. A longitudinal right temporal bone fracture extends through the right middle ear cavity. Carotid canal is intact. Inner ear structures are intact. Sinuses/Orbits: Mild mucosal thickening is present throughout the inferior frontal sinuses, ethmoid air cells and right maxillary sinus. Fluid is present in the left sphenoid sinus. Other: IMPRESSION: 1. Subarachnoid hemorrhage over the posterior right temporal lobe and parietal lobe. 2. Additional extra-axial hemorrhage over the right convexity. 3. Areas of subarachnoid hemorrhage in the anterior frontal lobes bilaterally with blood along the falx likely subdural. 4. Minimally displaced right occipital skull fracture. 5. A longitudinal right temporal bone fracture extends through the right middle ear cavity. Critical Value/emergent results were called  by telephone at the time of interpretation on 12/28/2020 at 4:45 pm to provider Dr. Bobbye Morton, who verbally acknowledged these results. Electronically Signed   By: Wynetta Fines.D.  On: 12/28/2020 17:10   CT CERVICAL SPINE WO CONTRAST  Result Date: 12/28/2020 CLINICAL DATA:  Fall down stairs.  Unconscious. EXAM: CT CERVICAL SPINE WITHOUT CONTRAST TECHNIQUE: Multidetector CT imaging of the cervical spine was performed without intravenous contrast. Multiplanar CT image reconstructions were also generated. COMPARISON:  None. FINDINGS: Alignment: No significant listhesis is present. Skull base and vertebrae: No acute fracture. No primary bone lesion or focal pathologic process. Soft tissues and spinal canal: No prevertebral fluid or swelling. No visible canal hematoma. Disc levels: Prominent disc protrusion present at C4-5 moderate to severe central canal stenosis. Moderate central canal stenosis is present at C3-4 due to a central disc protrusion. Multilevel foraminal narrowing is due to uncovertebral spurring, greatest at C4-5 and C6-7. Upper chest: The lung apices are clear. IMPRESSION: 1. No acute fracture or traumatic subluxation. 2. Multilevel degenerative changes of the cervical spine as described. These results were called by telephone at the time of interpretation on 12/28/2020 at 4:45 pm to provider Dr. Bobbye Morton, who verbally acknowledged these results. Electronically Signed   By: San Morelle M.D.   On: 12/28/2020 17:18   DG Pelvis Portable  Result Date: 12/28/2020 CLINICAL DATA:  Trauma, fall. EXAM: PORTABLE PELVIS 1-2 VIEWS COMPARISON:  None. FINDINGS: Technically limited exam due to body habitus. The cortical margins of the bony pelvis are grossly visualized intact. No fracture. Pubic symphysis and sacroiliac joints are congruent. Both femoral heads are well-seated in the respective acetabula. IMPRESSION: Technically limited exam due to body habitus. No evident pelvic fracture.  Electronically Signed   By: Keith Rake M.D.   On: 12/28/2020 16:45   CT CHEST ABDOMEN PELVIS W CONTRAST  Result Date: 12/28/2020 CLINICAL DATA:  Abdominal trauma.  Fall down stairs. EXAM: CT CHEST, ABDOMEN, AND PELVIS WITH CONTRAST TECHNIQUE: Multidetector CT imaging of the chest, abdomen and pelvis was performed following the standard protocol during bolus administration of intravenous contrast. CONTRAST:  180mL OMNIPAQUE IOHEXOL 350 MG/ML SOLN COMPARISON:  None. FINDINGS: CT CHEST FINDINGS Cardiovascular: Heart size normal. Coronary artery calcifications are present. Atherosclerotic calcifications are present at the aortic arch without aneurysm or stenosis. Pulmonary artery size is normal. Mediastinum/Nodes: 2.9 cm heterogeneous right thyroid nodule is present. Thoracic inlet is otherwise within normal limits. No significant mediastinal, hilar, or axillary adenopathy is present. Patient is intubated. Endotracheal tube is in satisfactory position. NG tube is in place. Lungs/Pleura: Bilateral posterior airspace opacities are present. Bilaterally posterior and medial lower lobe airspace opacities are present. No pneumothorax is present. No focal contusion is present. Musculoskeletal: No acute fractures. CT ABDOMEN PELVIS FINDINGS Hepatobiliary: No hepatic injury or perihepatic hematoma. Gallbladder is unremarkable. Pancreas: Unremarkable. No pancreatic ductal dilatation or surrounding inflammatory changes. Spleen: No splenic injury or perisplenic hematoma. Adrenals/Urinary Tract: Adrenal glands are normal bilaterally. Bilateral renal cysts are present. Slightly higher density 2 cm exophytic cyst is present at the lower pole of the left kidney. 10 mm exophytic cyst in the mid left kidney is of similar density. No solid mass lesion is present. Ureters are within normal limits. The urinary bladder is within limits. Stomach/Bowel: Stomach is within normal limits. Appendix appears normal. No evidence of bowel  wall thickening, distention, or inflammatory changes. Diverticular changes are present in the distal descending and proximal sigmoid colon without inflammation. Vascular/Lymphatic: Atherosclerotic calcifications are present within the aorta and branch vessels without aneurysm. Reproductive: Prostate is unremarkable. Other: No abdominal wall hernia or abnormality. No abdominopelvic ascites. Musculoskeletal: Advanced facet degenerative changes present at L3-4, L4-5, and  L5-S1. Vacuum disc is present at L3-4 and L4-5. Degenerative changes are noted in the SI joints bilaterally. No acute fractures are present. IMPRESSION: 1. No evidence for acute trauma to the chest, abdomen, or pelvis. 2. Bilateral posterior airspace opacities are concerning for aspiration. 3. Coronary artery disease. 4. Bilateral renal cysts. 5. Sigmoid diverticulosis without diverticulitis. 6. Advanced facet degenerative changes in the lower lumbar spine. 7. Degenerative changes of the SI joints bilaterally. 8. 2.9 cm heterogeneous right thyroid nodule is present. Recommend thyroid US (ref: J Am Coll Radiol. 2015 Feb;12(2): 143-50). 9. Aortic Atherosclerosis (ICD10-I70.0). Electronically Signed   By: San Morelle M.D.   On: 12/28/2020 17:26   DG Chest Port 1 View  Result Date: 12/28/2020 CLINICAL DATA:  Trauma, fall down multiple stairs. EXAM: PORTABLE CHEST 1 VIEW COMPARISON:  None. FINDINGS: Endotracheal tube tip 2.0 cm from the carina. Tip and side port of the enteric tube below the diaphragm. Mild cardiomegaly. No visualized pneumothorax or large pleural effusion. No confluent airspace disease. Suspected retrocardiac atelectasis. No displaced rib fractures seen. IMPRESSION: 1. Endotracheal and enteric tubes in place. 2. Mild cardiomegaly.  Retrocardiac atelectasis. 3. No obvious traumatic injury. Electronically Signed   By: Keith Rake M.D.   On: 12/28/2020 16:44   CT Maxillofacial Wo Contrast  Result Date:  12/28/2020 CLINICAL DATA:  Fall down stairs.  Facial trauma. EXAM: CT MAXILLOFACIAL WITHOUT CONTRAST TECHNIQUE: Multidetector CT imaging of the maxillofacial structures was performed. Multiplanar CT image reconstructions were also generated. COMPARISON:  None. FINDINGS: Osseous: Longitudinal right temporal bone fracture extends through the middle ear cavity. The minimally displaced right occipital bone fracture is also noted. No acute facial fractures are present. Mandible is intact and located. Cervical spine is within normal limits. Orbits: The globes and orbits are within normal limits. Sinuses: Mucosal thickening is present throughout the ethmoid air cells and inferior frontal sinuses. Fluid is present in the oropharynx, likely related to intubation. Soft tissues: Facial soft tissues are unremarkable. Limited intracranial: Extra-axial hemorrhage present over the right convexity. Bilateral frontal lobe subarachnoid hemorrhage present. Pneumocephalus noted. IMPRESSION: 1. Longitudinal right temporal bone fracture extends through the middle ear cavity. 2. Minimally displaced right occipital bone fracture. 3. Extra-axial hemorrhage over the right convexity. 4. Bilateral frontal lobe subarachnoid hemorrhage. Critical Value/emergent results were called by telephone at the time of interpretation on 12/28/2020 at 4:45 pm to provider Reather Laurence , who verbally acknowledged these results. Electronically Signed   By: San Morelle M.D.   On: 12/28/2020 17:29    Review of Systems  Unable to perform ROS: Intubated   Blood pressure (!) 142/89, pulse 86, temperature (!) 96.8 F (36 C), temperature source Temporal, resp. rate 16, height 6\' 2"  (1.88 m), weight 133.8 kg, SpO2 100 %. Estimated body mass index is 37.88 kg/m as calculated from the following:   Height as of this encounter: 6\' 2"  (1.88 m).   Weight as of this encounter: 133.8 kg.  Physical Exam Constitutional:      Interventions: He is sedated  and intubated. Cervical collar in place.     Comments: Briefly held propofol for neuro exam  HENT:     Head:     Comments: Right ear packed with gauze. Small amount of bright red sanguinous drainage noted. Eyes:     Conjunctiva/sclera: Conjunctivae normal.  Neck:     Comments: Immobilized in hard cervical collar. Cardiovascular:     Rate and Rhythm: Normal rate and regular rhythm.     Pulses:  Normal pulses.  Pulmonary:     Effort: Pulmonary effort is normal. He is intubated.  Abdominal:     General: Abdomen is protuberant.     Tenderness: There is no abdominal tenderness.  Genitourinary:    Penis: Normal.      Testes: Normal.  Musculoskeletal:        General: No deformity. Normal range of motion.  Skin:    General: Skin is warm and dry.     Capillary Refill: Capillary refill takes less than 2 seconds.  Neurological:     Comments: Patient opens eyes to voice with sedation turned down. Unable to get patient to follow commands during this exam.  Psychiatric:        Judgment: Judgment is impulsive.    Assessment/Plan: Patient presented to the [facility] ED on 12/28/2020 with [facility] and skull fractures following a fall down about 8 steps. He was intubated and sedated for airway protection. No Neurosurgical intervention recommended. Follow up scan ordered for 12/29/2020.  Viona Gilmore, DNP, AGNP-C Nurse Practitioner  [facility] [address], [address], [place], [place] [address] P: [phone]    F: [phone]  12/28/2020, 5:52 PM

## 2020-12-28 NOTE — Progress Notes (Signed)
Patient transported on ventilator to 3S93 with no complications.

## 2020-12-28 NOTE — Progress Notes (Signed)
   12/28/20 1544  Clinical Encounter Type  Visited With Patient not available  Visit Type Initial;Trauma  Referral From Nurse  Consult/Referral To Chaplain  Chaplain visited trauma patient at entry.  No spiritual distress was exhibited at this time.  No family present.  Will follow up as needed for additional spiritual support for patient and/or family.

## 2020-12-28 NOTE — Progress Notes (Signed)
Patient transported on ventilator to CT and back to room with no complications.

## 2020-12-28 NOTE — Consult Note (Signed)
Reason for Consult: Temporal bone fracture Referring Physician: Md, Trauma, MD  Angel [name] is an 70 y.o. male.  HPI: History of a fall down 8 stairs, intubated in the emergency department.  Prior to intubation he was noted to have symmetric facial movement.  There is no obvious hearing loss noted either at the time.  No past medical history on file.   No family history on file.  Social History:  has no history on file for tobacco use, alcohol use, and drug use.  Allergies: Not on File  Medications: Reviewed  Results for orders placed or performed during the hospital encounter of 12/28/20 (from the past 48 hour(s))  Sample to Blood Bank     Status: None   Collection Time: 12/28/20  4:05 PM  Result Value Ref Range   Blood Bank Specimen SAMPLE AVAILABLE FOR TESTING    Sample Expiration      12/29/2020,2359 Performed at [facility], [place] [address] [place], [place] [address]   Comprehensive metabolic panel     Status: Abnormal   Collection Time: 12/28/20  4:07 PM  Result Value Ref Range   Sodium 139 135 - 145 mmol/L   Potassium 3.3 (L) 3.5 - 5.1 mmol/L   Chloride 105 98 - 111 mmol/L   CO2 21 (L) 22 - 32 mmol/L   Glucose, Bld 166 (H) 70 - 99 mg/dL    Comment: Glucose reference range applies only to samples taken after fasting for at least 8 hours.   BUN 16 8 - 23 mg/dL   Creatinine, Ser 0.91 0.61 - 1.24 mg/dL   Calcium 9.1 8.9 - 10.3 mg/dL   Total Protein 6.5 6.5 - 8.1 g/dL   Albumin 3.5 3.5 - 5.0 g/dL   AST 33 15 - 41 U/L   ALT 27 0 - 44 U/L   Alkaline Phosphatase 66 38 - 126 U/L   Total Bilirubin 0.5 0.3 - 1.2 mg/dL   GFR, Estimated >60 >60 mL/min    Comment: (NOTE) Calculated using the CKD-EPI Creatinine Equation (2021)    Anion gap 13 5 - 15    Comment: Performed at [facility] [address], [place], [place] [address]  CBC     Status: Abnormal   Collection Time: 12/28/20  4:07 PM  Result Value Ref Range   WBC 7.6 4.0 - 10.5 K/uL   RBC 4.18 (L)  4.22 - 5.81 MIL/uL   Hemoglobin 10.8 (L) 13.0 - 17.0 g/dL   HCT 34.1 (L) 39.0 - 52.0 %   MCV 81.6 80.0 - 100.0 fL   MCH 25.8 (L) 26.0 - 34.0 pg   MCHC 31.7 30.0 - 36.0 g/dL   RDW 15.5 11.5 - 15.5 %   Platelets 196 150 - 400 K/uL   nRBC 0.0 0.0 - 0.2 %    Comment: Performed at [facility], [place] [address] [place], [place] [address]  Ethanol     Status: None   Collection Time: 12/28/20  4:07 PM  Result Value Ref Range   Alcohol, Ethyl (B) <10 <10 mg/dL    Comment: (NOTE) Lowest detectable limit for serum alcohol is 10 mg/dL.  For medical purposes only. Performed at [facility], [place] [address] [place], [place] [address]   Lactic acid, plasma     Status: Abnormal   Collection Time: 12/28/20  4:07 PM  Result Value Ref Range   Lactic Acid, Venous 4.0 (HH) 0.5 - 1.9 mmol/L  Comment: CRITICAL RESULT CALLED TO, READ BACK BY AND VERIFIED WITHSammie Bench RN [phone] K FORSYTH Performed at [facility] [address] [place], [place] [address]   Protime-INR     Status: None   Collection Time: 12/28/20  4:07 PM  Result Value Ref Range   Prothrombin Time 14.0 11.4 - 15.2 seconds   INR 1.1 0.8 - 1.2    Comment: (NOTE) INR goal varies based on device and disease states. Performed at [facility], [place] [address], [place], [place] [address]   I-Stat Chem 8, ED     Status: Abnormal   Collection Time: 12/28/20  4:16 PM  Result Value Ref Range   Sodium 141 135 - 145 mmol/L   Potassium 3.4 (L) 3.5 - 5.1 mmol/L   Chloride 102 98 - 111 mmol/L   BUN 16 8 - 23 mg/dL   Creatinine, Ser 0.80 0.61 - 1.24 mg/dL   Glucose, Bld 165 (H) 70 - 99 mg/dL    Comment: Glucose reference range applies only to samples taken after fasting for at least 8 hours.   Calcium, Ion 1.19 1.15 - 1.40 mmol/L   TCO2 21 (L) 22 - 32 mmol/L   Hemoglobin 11.6 (L) 13.0 - 17.0 g/dL   HCT 34.0 (L) 39.0 - 52.0 %    CT HEAD WO CONTRAST  Result Date: 12/28/2020 CLINICAL DATA:  Level 1  trauma. Fall down 8 stairs. Positive loss of consciousness. Bleeding from the right ear. EXAM: CT HEAD WITHOUT CONTRAST TECHNIQUE: Contiguous axial images were obtained from the base of the skull through the vertex without intravenous contrast. COMPARISON:  None. FINDINGS: Brain: Subarachnoid hemorrhage is present over the posterior right temporal lobe and parietal lobe. Additional extra-axial hemorrhage is present over the right convexity. Areas of subarachnoid hemorrhage are present in the anterior frontal lobes bilaterally with blood along the falx likely subdural. No parenchymal hemorrhage is present. No acute infarct is present. No mass lesion. Ventricles are of normal size. The brainstem and cerebellum are within normal limits. Vascular: Atherosclerotic calcifications are present within the cavernous internal carotid arteries. No hyperdense vessel is present. Skull: Minimally displaced right occipital skull fracture is present. A longitudinal right temporal bone fracture extends through the right middle ear cavity. Carotid canal is intact. Inner ear structures are intact. Sinuses/Orbits: Mild mucosal thickening is present throughout the inferior frontal sinuses, ethmoid air cells and right maxillary sinus. Fluid is present in the left sphenoid sinus. Other: IMPRESSION: 1. Subarachnoid hemorrhage over the posterior right temporal lobe and parietal lobe. 2. Additional extra-axial hemorrhage over the right convexity. 3. Areas of subarachnoid hemorrhage in the anterior frontal lobes bilaterally with blood along the falx likely subdural. 4. Minimally displaced right occipital skull fracture. 5. A longitudinal right temporal bone fracture extends through the right middle ear cavity. Critical Value/emergent results were called by telephone at the time of interpretation on 12/28/2020 at 4:45 pm to provider Dr. Bobbye Morton, who verbally acknowledged these results. Electronically Signed   By: San Morelle M.D.   On:  12/28/2020 17:10   CT CERVICAL SPINE WO CONTRAST  Result Date: 12/28/2020 CLINICAL DATA:  Fall down stairs.  Unconscious. EXAM: CT CERVICAL SPINE WITHOUT CONTRAST TECHNIQUE: Multidetector CT imaging of the cervical spine was performed without intravenous contrast. Multiplanar CT image reconstructions were also generated. COMPARISON:  None. FINDINGS: Alignment: No significant listhesis is present. Skull base and vertebrae: No acute fracture. No primary bone lesion or focal pathologic process. Soft  tissues and spinal canal: No prevertebral fluid or swelling. No visible canal hematoma. Disc levels: Prominent disc protrusion present at C4-5 moderate to severe central canal stenosis. Moderate central canal stenosis is present at C3-4 due to a central disc protrusion. Multilevel foraminal narrowing is due to uncovertebral spurring, greatest at C4-5 and C6-7. Upper chest: The lung apices are clear. IMPRESSION: 1. No acute fracture or traumatic subluxation. 2. Multilevel degenerative changes of the cervical spine as described. These results were called by telephone at the time of interpretation on 12/28/2020 at 4:45 pm to provider Dr. Bobbye Morton, who verbally acknowledged these results. Electronically Signed   By: San Morelle M.D.   On: 12/28/2020 17:18   DG Pelvis Portable  Result Date: 12/28/2020 CLINICAL DATA:  Trauma, fall. EXAM: PORTABLE PELVIS 1-2 VIEWS COMPARISON:  None. FINDINGS: Technically limited exam due to body habitus. The cortical margins of the bony pelvis are grossly visualized intact. No fracture. Pubic symphysis and sacroiliac joints are congruent. Both femoral heads are well-seated in the respective acetabula. IMPRESSION: Technically limited exam due to body habitus. No evident pelvic fracture. Electronically Signed   By: Keith Rake M.D.   On: 12/28/2020 16:45   DG Chest Port 1 View  Result Date: 12/28/2020 CLINICAL DATA:  Trauma, fall down multiple stairs. EXAM: PORTABLE CHEST 1  VIEW COMPARISON:  None. FINDINGS: Endotracheal tube tip 2.0 cm from the carina. Tip and side port of the enteric tube below the diaphragm. Mild cardiomegaly. No visualized pneumothorax or large pleural effusion. No confluent airspace disease. Suspected retrocardiac atelectasis. No displaced rib fractures seen. IMPRESSION: 1. Endotracheal and enteric tubes in place. 2. Mild cardiomegaly.  Retrocardiac atelectasis. 3. No obvious traumatic injury. Electronically Signed   By: Keith Rake M.D.   On: 12/28/2020 16:44    CJA:RWPTYYPE except as listed in admit H&P  Blood pressure (!) 149/96, pulse 87, temperature (!) 96.8 F (36 C), temperature source Temporal, resp. rate 16, height 6\' 2"  (1.88 m), weight 133.8 kg, SpO2 100 %.  PHYSICAL EXAM: Overall appearance: Intubated and sedated Head:  Normocephalic, atraumatic. Ears: External auditory canals are clear on the left with an intact tympanic membrane, and blood-filled on the right, unable to visualize the drum. Nose: External nose is healthy in appearance. Internal nasal exam free of any lesions or obstruction. Oral Cavity/Pharynx:  There are no mucosal lesions or masses identified.  Endotracheal tube in place. Larynx/Hypopharynx: Deferred Neuro: Sedated on ventilator. Neck: Cervical collar in place.  Studies Reviewed: Maxillofacial CT reviewed.  Procedures: none   Assessment/Plan: Maxillofacial CT reviewed.  Temporal bone fracture on the right.  Clinically seems to involve the ear canal.  Will reevaluate hearing and facial nerve when clinically appropriate.  No treatment recommendations in the acute setting.  S02.19XA Level 1 trauma   Izora Gala 12/28/2020, 5:26 PM

## 2020-12-28 NOTE — ED Notes (Signed)
Intubated by dr Langston Masker, 25 at the lip

## 2020-12-28 NOTE — H&P (Signed)
Angel [name] 1950/07/25  [id number].    Requesting MD: Dr. Langston Masker Chief Complaint/Reason for Consult: Level 1 trauma, Fall down stairs  Primary Survey: airway intact, breath sounds intact bilaterally, pulses intact peripherally  GCS: 14, E4V4M6   HPI: Angel [name] is a 70 y.o. male who presented via EMS as a level 1 trauma. Per EMS report patient was at home, going to his basement when he fell down ~8 stairs and hit his head. Reports LOC. Was agitated in route, confused and emesis x 2. On arrival he was sitting up on stretcher and reported he was nauseated. Noted to be hypertensive on initial pressure with HR in the 80's. He had blood coming from his right ear. Appeared to have TM rupture per EDP exam. HA reported, otherwise he did not report any area of pain. EMS reports PMhx of HTN and DM2. He reports that he takes allopurinol at home. No reported blood thinners. NKDA. Occasional alcohol use. No illicit drug use. Tobacco use unknown. He lives at home with his wife. He was intubated in the trauma bay for airway protection. He was brought to the CT scanner.   ROS: Review of Systems  Unable to perform ROS: Acuity of condition   No family history on file.  No past medical history on file. As above  Social History:  has no history on file for tobacco use, alcohol use, and drug use.  Allergies: Not on File  (Not in a hospital admission)   Physical Exam: Blood pressure (!) 190/86, pulse 82, temperature (!) 96.8 F (36 C), temperature source Temporal, resp. rate (!) 34, height 6\' 2"  (1.88 m), weight 133.8 kg, SpO2 97 %. General: pleasant, WD/WN white male who is sitting up in bed in mild distress HEENT: Sclera are noninjected.  PERRL.  EOM grossly intact. Nose without any masses or lesions. R ear with blood coming from canal. Appeared to have ruptured TM per EDP exam. Mouth is pink and moist. Dentition fair and partial dentures removed by patient.  Neck: C-Collar in place. Trachea  midline. No stridor.  Heart: regular, rate, and rhythm.  Normal s1,s2. No obvious murmurs, gallops, or rubs noted.  Palpable radial and pedal pulses bilaterally  Lungs: CTAB, no wheezes, rhonchi, or rales noted.  Respiratory effort nonlabored Abd: Soft, NT/ND, +BS, no masses, hernias, or organomegaly. Appears to have diastasis  MS: No gross deformities and joint swelling. Actively moves all extremities. No BUE/BLE edema, calves soft and nontender. No T or L spinous process step offs.  Skin: warm and dry with no masses, lesions, or rashes Psych: Orientated to self and appears confused, with repetitive questioning  Neuro: E4V4M6. Cranial nerves grossly intact, moves all extremities, normal speech, gait not assessed  No results found for this or any previous visit (from the past 48 hour(s)). No results found.  Anti-infectives (From admission, onward)    None       Assessment/Plan Fall down stairs VDRF  TBI/SAH/SDH - NSGY consult, Dr. Annette Stable. ICU, q2 hr neuro checks. Keppra. CTH in AM. Awaiting final recs Occipital bone fx - Per NSGY Temporal bone fx extending into middle ear - ENT consult  Right TM Rupture - ENT consult Hx DM2 - SSI Hx HTN - PRN meds C-Spine - Collar, not cleared.  FEN - NPO, IVF, OGT to LIWS VTE - SCDs, hold given TBI ID - None currently  Foley - Place for I/O monitoring  Dispo - Admit to inpatient, ICU. Await final  CT reads  Jillyn Ledger, [facility] Surgery 12/28/2020, 4:26 PM Please see Amion for pager number during day hours 7:00am-4:30pm

## 2020-12-28 NOTE — Progress Notes (Signed)
Orthopedic Tech Progress Note Patient Details:  Angel [name] May 26, 1950 [id number] Trauma Level 1 Patient ID: Angel [name], male   DOB: 1950/11/13, 70 y.o.   MRN: [id number]  Petra Kuba 12/28/2020, 4:13 PM

## 2020-12-29 ENCOUNTER — Inpatient Hospital Stay (HOSPITAL_COMMUNITY): Payer: PPO

## 2020-12-29 DIAGNOSIS — S066X0A Traumatic subarachnoid hemorrhage without loss of consciousness, initial encounter: Secondary | ICD-10-CM | POA: Diagnosis not present

## 2020-12-29 DIAGNOSIS — S02119A Unspecified fracture of occiput, initial encounter for closed fracture: Secondary | ICD-10-CM | POA: Diagnosis not present

## 2020-12-29 DIAGNOSIS — I611 Nontraumatic intracerebral hemorrhage in hemisphere, cortical: Secondary | ICD-10-CM | POA: Diagnosis not present

## 2020-12-29 DIAGNOSIS — S06310A Contusion and laceration of right cerebrum without loss of consciousness, initial encounter: Secondary | ICD-10-CM | POA: Diagnosis not present

## 2020-12-29 LAB — BASIC METABOLIC PANEL
Anion gap: 9 (ref 5–15)
BUN: 17 mg/dL (ref 8–23)
CO2: 21 mmol/L — ABNORMAL LOW (ref 22–32)
Calcium: 8.8 mg/dL — ABNORMAL LOW (ref 8.9–10.3)
Chloride: 106 mmol/L (ref 98–111)
Creatinine, Ser: 0.9 mg/dL (ref 0.61–1.24)
GFR, Estimated: >60 mL/min (ref >60)
Glucose, Bld: 148 mg/dL — ABNORMAL HIGH (ref 70–99)
Potassium: 3.8 mmol/L (ref 3.5–5.1)
Sodium: 136 mmol/L (ref 135–145)

## 2020-12-29 LAB — CBC
HCT: 29.2 % — ABNORMAL LOW (ref 39.0–52.0)
Hemoglobin: 9.5 g/dL — ABNORMAL LOW (ref 13.0–17.0)
MCH: 25.7 pg — ABNORMAL LOW (ref 26.0–34.0)
MCHC: 32.5 g/dL (ref 30.0–36.0)
MCV: 78.9 fL — ABNORMAL LOW (ref 80.0–100.0)
Platelets: 179 10*3/uL (ref 150–400)
RBC: 3.7 MIL/uL — ABNORMAL LOW (ref 4.22–5.81)
RDW: 15.7 % — ABNORMAL HIGH (ref 11.5–15.5)
WBC: 7.2 10*3/uL (ref 4.0–10.5)
nRBC: 0 % (ref 0.0–0.2)

## 2020-12-29 LAB — TRIGLYCERIDES: Triglycerides: 131 mg/dL (ref <150)

## 2020-12-29 LAB — GLUCOSE, CAPILLARY
Glucose-Capillary: 104 mg/dL — ABNORMAL HIGH (ref 70–99)
Glucose-Capillary: 109 mg/dL — ABNORMAL HIGH (ref 70–99)
Glucose-Capillary: 111 mg/dL — ABNORMAL HIGH (ref 70–99)
Glucose-Capillary: 148 mg/dL — ABNORMAL HIGH (ref 70–99)
Glucose-Capillary: 161 mg/dL — ABNORMAL HIGH (ref 70–99)
Glucose-Capillary: 98 mg/dL (ref 70–99)

## 2020-12-29 LAB — MRSA NEXT GEN BY PCR, NASAL: MRSA by PCR Next Gen: NOT DETECTED

## 2020-12-29 MED ORDER — CHLORHEXIDINE GLUCONATE CLOTH 2 % EX PADS
6.0000 | MEDICATED_PAD | Freq: Every day | CUTANEOUS | Status: DC
  Administered 2020-12-29 – 2021-02-13 (×41): 6 via TOPICAL

## 2020-12-29 MED ORDER — CHLORHEXIDINE GLUCONATE CLOTH 2 % EX PADS: 6.0000 | MEDICATED_PAD | Freq: Every day | CUTANEOUS | Status: DC

## 2020-12-29 NOTE — Progress Notes (Signed)
RT transported patient form [id number] to CT and back with RN. No complications and vital signs stable. RT will continue to monitor.

## 2020-12-29 NOTE — Progress Notes (Addendum)
Providing Compassionate, Quality Care - Together   Subjective: Patient intubated and sedated. Per nurse, patient is able to follow commands when sedation is lowered.  Objective: Vital signs in last 24 hours: Temp:  [96.8 F (36 C)-99.8 F (37.7 C)] 99.8 F (37.7 C) (08/13 0800) Pulse Rate:  [55-95] 59 (08/13 1100) Resp:  [16-34] 20 (08/13 1100) BP: (98-190)/(55-117) 109/64 (08/13 1100) SpO2:  [94 %-100 %] 96 % (08/13 1100) FiO2 (%):  [40 %-100 %] 40 % (08/13 0754) Weight:  [133.8 kg] 133.8 kg (08/12 1611)  Intake/Output from previous day: 08/12 0701 - 08/13 0700 In: 1607.5 [I.V.:1507.5; IV Piggyback:100] Out: 575 [Urine:575] Intake/Output this shift: Total I/O In: 1083.2 [I.V.:983.1; IV Piggyback:100.1] Out: 175 [Urine:175]  Intubated and sedated PERRLA ETT MAE, Purposeful movement, strength intact   Lab Results: Recent Labs    12/28/20 1607 12/28/20 1616 12/28/20 1746 12/29/20 0638  WBC 7.6  --   --  7.2  HGB 10.8*   < > 11.2* 9.5*  HCT 34.1*   < > 33.0* 29.2*  PLT 196  --   --  179   < > = values in this interval not displayed.   BMET Recent Labs    12/28/20 1607 12/28/20 1616 12/28/20 1746 12/29/20 0638  NA 139 141 139 136  K 3.3* 3.4* 3.6 3.8  CL 105 102  --  106  CO2 21*  --   --  21*  GLUCOSE 166* 165*  --  148*  BUN 16 16  --  17  CREATININE 0.91 0.80  --  0.90  CALCIUM 9.1  --   --  8.8*    Studies/Results: CT HEAD WO CONTRAST  Result Date: 12/28/2020 CLINICAL DATA:  Level 1 trauma. Fall down 8 stairs. Positive loss of consciousness. Bleeding from the right ear. EXAM: CT HEAD WITHOUT CONTRAST TECHNIQUE: Contiguous axial images were obtained from the base of the skull through the vertex without intravenous contrast. COMPARISON:  None. FINDINGS: Brain: Subarachnoid hemorrhage is present over the posterior right temporal lobe and parietal lobe. Additional extra-axial hemorrhage is present over the right convexity. Areas of subarachnoid  hemorrhage are present in the anterior frontal lobes bilaterally with blood along the falx likely subdural. No parenchymal hemorrhage is present. No acute infarct is present. No mass lesion. Ventricles are of normal size. The brainstem and cerebellum are within normal limits. Vascular: Atherosclerotic calcifications are present within the cavernous internal carotid arteries. No hyperdense vessel is present. Skull: Minimally displaced right occipital skull fracture is present. A longitudinal right temporal bone fracture extends through the right middle ear cavity. Carotid canal is intact. Inner ear structures are intact. Sinuses/Orbits: Mild mucosal thickening is present throughout the inferior frontal sinuses, ethmoid air cells and right maxillary sinus. Fluid is present in the left sphenoid sinus. Other: IMPRESSION: 1. Subarachnoid hemorrhage over the posterior right temporal lobe and parietal lobe. 2. Additional extra-axial hemorrhage over the right convexity. 3. Areas of subarachnoid hemorrhage in the anterior frontal lobes bilaterally with blood along the falx likely subdural. 4. Minimally displaced right occipital skull fracture. 5. A longitudinal right temporal bone fracture extends through the right middle ear cavity. Critical Value/emergent results were called by telephone at the time of interpretation on 12/28/2020 at 4:45 pm to provider Dr. Bobbye Morton, who verbally acknowledged these results. Electronically Signed   By: San Morelle M.D.   On: 12/28/2020 17:10   CT CERVICAL SPINE WO CONTRAST  Result Date: 12/28/2020 CLINICAL DATA:  Fall down stairs.  Unconscious. EXAM: CT CERVICAL SPINE WITHOUT CONTRAST TECHNIQUE: Multidetector CT imaging of the cervical spine was performed without intravenous contrast. Multiplanar CT image reconstructions were also generated. COMPARISON:  None. FINDINGS: Alignment: No significant listhesis is present. Skull base and vertebrae: No acute fracture. No primary bone  lesion or focal pathologic process. Soft tissues and spinal canal: No prevertebral fluid or swelling. No visible canal hematoma. Disc levels: Prominent disc protrusion present at C4-5 moderate to severe central canal stenosis. Moderate central canal stenosis is present at C3-4 due to a central disc protrusion. Multilevel foraminal narrowing is due to uncovertebral spurring, greatest at C4-5 and C6-7. Upper chest: The lung apices are clear. IMPRESSION: 1. No acute fracture or traumatic subluxation. 2. Multilevel degenerative changes of the cervical spine as described. These results were called by telephone at the time of interpretation on 12/28/2020 at 4:45 pm to provider Dr. Bobbye Morton, who verbally acknowledged these results. Electronically Signed   By: San Morelle M.D.   On: 12/28/2020 17:18   DG Pelvis Portable  Result Date: 12/28/2020 CLINICAL DATA:  Trauma, fall. EXAM: PORTABLE PELVIS 1-2 VIEWS COMPARISON:  None. FINDINGS: Technically limited exam due to body habitus. The cortical margins of the bony pelvis are grossly visualized intact. No fracture. Pubic symphysis and sacroiliac joints are congruent. Both femoral heads are well-seated in the respective acetabula. IMPRESSION: Technically limited exam due to body habitus. No evident pelvic fracture. Electronically Signed   By: Keith Rake M.D.   On: 12/28/2020 16:45   CT CHEST ABDOMEN PELVIS W CONTRAST  Result Date: 12/28/2020 CLINICAL DATA:  Abdominal trauma.  Fall down stairs. EXAM: CT CHEST, ABDOMEN, AND PELVIS WITH CONTRAST TECHNIQUE: Multidetector CT imaging of the chest, abdomen and pelvis was performed following the standard protocol during bolus administration of intravenous contrast. CONTRAST:  194mL OMNIPAQUE IOHEXOL 350 MG/ML SOLN COMPARISON:  None. FINDINGS: CT CHEST FINDINGS Cardiovascular: Heart size normal. Coronary artery calcifications are present. Atherosclerotic calcifications are present at the aortic arch without aneurysm  or stenosis. Pulmonary artery size is normal. Mediastinum/Nodes: 2.9 cm heterogeneous right thyroid nodule is present. Thoracic inlet is otherwise within normal limits. No significant mediastinal, hilar, or axillary adenopathy is present. Patient is intubated. Endotracheal tube is in satisfactory position. NG tube is in place. Lungs/Pleura: Bilateral posterior airspace opacities are present. Bilaterally posterior and medial lower lobe airspace opacities are present. No pneumothorax is present. No focal contusion is present. Musculoskeletal: No acute fractures. CT ABDOMEN PELVIS FINDINGS Hepatobiliary: No hepatic injury or perihepatic hematoma. Gallbladder is unremarkable. Pancreas: Unremarkable. No pancreatic ductal dilatation or surrounding inflammatory changes. Spleen: No splenic injury or perisplenic hematoma. Adrenals/Urinary Tract: Adrenal glands are normal bilaterally. Bilateral renal cysts are present. Slightly higher density 2 cm exophytic cyst is present at the lower pole of the left kidney. 10 mm exophytic cyst in the mid left kidney is of similar density. No solid mass lesion is present. Ureters are within normal limits. The urinary bladder is within limits. Stomach/Bowel: Stomach is within normal limits. Appendix appears normal. No evidence of bowel wall thickening, distention, or inflammatory changes. Diverticular changes are present in the distal descending and proximal sigmoid colon without inflammation. Vascular/Lymphatic: Atherosclerotic calcifications are present within the aorta and branch vessels without aneurysm. Reproductive: Prostate is unremarkable. Other: No abdominal wall hernia or abnormality. No abdominopelvic ascites. Musculoskeletal: Advanced facet degenerative changes present at L3-4, L4-5, and L5-S1. Vacuum disc is present at L3-4 and L4-5. Degenerative changes are noted in the SI joints bilaterally. No acute fractures  are present. IMPRESSION: 1. No evidence for acute trauma to the  chest, abdomen, or pelvis. 2. Bilateral posterior airspace opacities are concerning for aspiration. 3. Coronary artery disease. 4. Bilateral renal cysts. 5. Sigmoid diverticulosis without diverticulitis. 6. Advanced facet degenerative changes in the lower lumbar spine. 7. Degenerative changes of the SI joints bilaterally. 8. 2.9 cm heterogeneous right thyroid nodule is present. Recommend thyroid US (ref: J Am Coll Radiol. 2015 Feb;12(2): 143-50). 9. Aortic Atherosclerosis (ICD10-I70.0). Electronically Signed   By: San Morelle M.D.   On: 12/28/2020 17:26   DG Chest Port 1 View  Result Date: 12/28/2020 CLINICAL DATA:  Trauma, fall down multiple stairs. EXAM: PORTABLE CHEST 1 VIEW COMPARISON:  None. FINDINGS: Endotracheal tube tip 2.0 cm from the carina. Tip and side port of the enteric tube below the diaphragm. Mild cardiomegaly. No visualized pneumothorax or large pleural effusion. No confluent airspace disease. Suspected retrocardiac atelectasis. No displaced rib fractures seen. IMPRESSION: 1. Endotracheal and enteric tubes in place. 2. Mild cardiomegaly.  Retrocardiac atelectasis. 3. No obvious traumatic injury. Electronically Signed   By: Keith Rake M.D.   On: 12/28/2020 16:44   CT Maxillofacial Wo Contrast  Result Date: 12/28/2020 CLINICAL DATA:  Fall down stairs.  Facial trauma. EXAM: CT MAXILLOFACIAL WITHOUT CONTRAST TECHNIQUE: Multidetector CT imaging of the maxillofacial structures was performed. Multiplanar CT image reconstructions were also generated. COMPARISON:  None. FINDINGS: Osseous: Longitudinal right temporal bone fracture extends through the middle ear cavity. The minimally displaced right occipital bone fracture is also noted. No acute facial fractures are present. Mandible is intact and located. Cervical spine is within normal limits. Orbits: The globes and orbits are within normal limits. Sinuses: Mucosal thickening is present throughout the ethmoid air cells and inferior  frontal sinuses. Fluid is present in the oropharynx, likely related to intubation. Soft tissues: Facial soft tissues are unremarkable. Limited intracranial: Extra-axial hemorrhage present over the right convexity. Bilateral frontal lobe subarachnoid hemorrhage present. Pneumocephalus noted. IMPRESSION: 1. Longitudinal right temporal bone fracture extends through the middle ear cavity. 2. Minimally displaced right occipital bone fracture. 3. Extra-axial hemorrhage over the right convexity. 4. Bilateral frontal lobe subarachnoid hemorrhage. Critical Value/emergent results were called by telephone at the time of interpretation on 12/28/2020 at 4:45 pm to provider Reather Laurence , who verbally acknowledged these results. Electronically Signed   By: San Morelle M.D.   On: 12/28/2020 17:29    Assessment/Plan: Patient presented to the [facility] ED on 12/28/2020 with SAH and skull fractures following a fall down about 8 steps. He remains intubated and sedated for airway protection. No Neurosurgical intervention recommended. Follow up scan ordered for today at noon.   LOS: 1 day   -Follow up scan today -Extubation per trauma, likely tomorrow AM   Viona Gilmore, DNP, AGNP-C Nurse Practitioner  [facility]. [address], [address], [place], [place] [address] P: [phone]    F: [phone]  12/29/2020, 12:19 PM

## 2020-12-29 NOTE — Progress Notes (Signed)
Subjective/Chief Complaint: Intubated, sedated   Objective: Vital signs in last 24 hours: Temp:  [96.8 F (36 C)-99.8 F (37.7 C)] 99.8 F (37.7 C) (08/13 0800) Pulse Rate:  [55-95] 57 (08/13 1000) Resp:  [16-34] 20 (08/13 1000) BP: (98-190)/(55-117) 113/69 (08/13 1000) SpO2:  [94 %-100 %] 96 % (08/13 1000) FiO2 (%):  [40 %-100 %] 40 % (08/13 0754) Weight:  [133.8 kg] 133.8 kg (08/12 1611) Last BM Date:  (PTA)  Intake/Output from previous day: 08/12 0701 - 08/13 0700 In: 1607.5 [I.V.:1507.5; IV Piggyback:100] Out: 575 [Urine:575] Intake/Output this shift: Total I/O In: 929.7 [I.V.:829.6; IV Piggyback:100.1] Out: 125 [Urine:125]  General: sedated HEENT:  PERRL R ear with fluid coming from canal.   Neck: c collar removed yesterday, not able to assess tenderness Heart: RRR Lungs: CTAB Abd: Soft, NT/ND MS: No gross deformities and joint swelling.No BUE/BLE edema Skin: warm and dry with no masses, lesions, or rashes Neuro: sedated now  Lab Results:  Recent Labs    12/28/20 1607 12/28/20 1616 12/28/20 1746 12/29/20 0638  WBC 7.6  --   --  7.2  HGB 10.8*   < > 11.2* 9.5*  HCT 34.1*   < > 33.0* 29.2*  PLT 196  --   --  179   < > = values in this interval not displayed.   BMET Recent Labs    12/28/20 1607 12/28/20 1616 12/28/20 1746 12/29/20 0638  NA 139 141 139 136  K 3.3* 3.4* 3.6 3.8  CL 105 102  --  106  CO2 21*  --   --  21*  GLUCOSE 166* 165*  --  148*  BUN 16 16  --  17  CREATININE 0.91 0.80  --  0.90  CALCIUM 9.1  --   --  8.8*   PT/INR Recent Labs    12/28/20 1607  LABPROT 14.0  INR 1.1   ABG Recent Labs    12/28/20 1746  PHART 7.339*  HCO3 27.6    Studies/Results: CT HEAD WO CONTRAST  Result Date: 12/28/2020 CLINICAL DATA:  Level 1 trauma. Fall down 8 stairs. Positive loss of consciousness. Bleeding from the right ear. EXAM: CT HEAD WITHOUT CONTRAST TECHNIQUE: Contiguous axial images were obtained from the base of the skull  through the vertex without intravenous contrast. COMPARISON:  None. FINDINGS: Brain: Subarachnoid hemorrhage is present over the posterior right temporal lobe and parietal lobe. Additional extra-axial hemorrhage is present over the right convexity. Areas of subarachnoid hemorrhage are present in the anterior frontal lobes bilaterally with blood along the falx likely subdural. No parenchymal hemorrhage is present. No acute infarct is present. No mass lesion. Ventricles are of normal size. The brainstem and cerebellum are within normal limits. Vascular: Atherosclerotic calcifications are present within the cavernous internal carotid arteries. No hyperdense vessel is present. Skull: Minimally displaced right occipital skull fracture is present. A longitudinal right temporal bone fracture extends through the right middle ear cavity. Carotid canal is intact. Inner ear structures are intact. Sinuses/Orbits: Mild mucosal thickening is present throughout the inferior frontal sinuses, ethmoid air cells and right maxillary sinus. Fluid is present in the left sphenoid sinus. Other: IMPRESSION: 1. Subarachnoid hemorrhage over the posterior right temporal lobe and parietal lobe. 2. Additional extra-axial hemorrhage over the right convexity. 3. Areas of subarachnoid hemorrhage in the anterior frontal lobes bilaterally with blood along the falx likely subdural. 4. Minimally displaced right occipital skull fracture. 5. A longitudinal right temporal bone fracture extends through the right  middle ear cavity. Critical Value/emergent results were called by telephone at the time of interpretation on 12/28/2020 at 4:45 pm to provider Dr. Bobbye Morton, who verbally acknowledged these results. Electronically Signed   By: San Morelle M.D.   On: 12/28/2020 17:10   CT CERVICAL SPINE WO CONTRAST  Result Date: 12/28/2020 CLINICAL DATA:  Fall down stairs.  Unconscious. EXAM: CT CERVICAL SPINE WITHOUT CONTRAST TECHNIQUE: Multidetector CT  imaging of the cervical spine was performed without intravenous contrast. Multiplanar CT image reconstructions were also generated. COMPARISON:  None. FINDINGS: Alignment: No significant listhesis is present. Skull base and vertebrae: No acute fracture. No primary bone lesion or focal pathologic process. Soft tissues and spinal canal: No prevertebral fluid or swelling. No visible canal hematoma. Disc levels: Prominent disc protrusion present at C4-5 moderate to severe central canal stenosis. Moderate central canal stenosis is present at C3-4 due to a central disc protrusion. Multilevel foraminal narrowing is due to uncovertebral spurring, greatest at C4-5 and C6-7. Upper chest: The lung apices are clear. IMPRESSION: 1. No acute fracture or traumatic subluxation. 2. Multilevel degenerative changes of the cervical spine as described. These results were called by telephone at the time of interpretation on 12/28/2020 at 4:45 pm to provider Dr. Bobbye Morton, who verbally acknowledged these results. Electronically Signed   By: San Morelle M.D.   On: 12/28/2020 17:18   DG Pelvis Portable  Result Date: 12/28/2020 CLINICAL DATA:  Trauma, fall. EXAM: PORTABLE PELVIS 1-2 VIEWS COMPARISON:  None. FINDINGS: Technically limited exam due to body habitus. The cortical margins of the bony pelvis are grossly visualized intact. No fracture. Pubic symphysis and sacroiliac joints are congruent. Both femoral heads are well-seated in the respective acetabula. IMPRESSION: Technically limited exam due to body habitus. No evident pelvic fracture. Electronically Signed   By: Keith Rake M.D.   On: 12/28/2020 16:45   CT CHEST ABDOMEN PELVIS W CONTRAST  Result Date: 12/28/2020 CLINICAL DATA:  Abdominal trauma.  Fall down stairs. EXAM: CT CHEST, ABDOMEN, AND PELVIS WITH CONTRAST TECHNIQUE: Multidetector CT imaging of the chest, abdomen and pelvis was performed following the standard protocol during bolus administration of  intravenous contrast. CONTRAST:  163mL OMNIPAQUE IOHEXOL 350 MG/ML SOLN COMPARISON:  None. FINDINGS: CT CHEST FINDINGS Cardiovascular: Heart size normal. Coronary artery calcifications are present. Atherosclerotic calcifications are present at the aortic arch without aneurysm or stenosis. Pulmonary artery size is normal. Mediastinum/Nodes: 2.9 cm heterogeneous right thyroid nodule is present. Thoracic inlet is otherwise within normal limits. No significant mediastinal, hilar, or axillary adenopathy is present. Patient is intubated. Endotracheal tube is in satisfactory position. NG tube is in place. Lungs/Pleura: Bilateral posterior airspace opacities are present. Bilaterally posterior and medial lower lobe airspace opacities are present. No pneumothorax is present. No focal contusion is present. Musculoskeletal: No acute fractures. CT ABDOMEN PELVIS FINDINGS Hepatobiliary: No hepatic injury or perihepatic hematoma. Gallbladder is unremarkable. Pancreas: Unremarkable. No pancreatic ductal dilatation or surrounding inflammatory changes. Spleen: No splenic injury or perisplenic hematoma. Adrenals/Urinary Tract: Adrenal glands are normal bilaterally. Bilateral renal cysts are present. Slightly higher density 2 cm exophytic cyst is present at the lower pole of the left kidney. 10 mm exophytic cyst in the mid left kidney is of similar density. No solid mass lesion is present. Ureters are within normal limits. The urinary bladder is within limits. Stomach/Bowel: Stomach is within normal limits. Appendix appears normal. No evidence of bowel wall thickening, distention, or inflammatory changes. Diverticular changes are present in the distal descending and proximal sigmoid  colon without inflammation. Vascular/Lymphatic: Atherosclerotic calcifications are present within the aorta and branch vessels without aneurysm. Reproductive: Prostate is unremarkable. Other: No abdominal wall hernia or abnormality. No abdominopelvic  ascites. Musculoskeletal: Advanced facet degenerative changes present at L3-4, L4-5, and L5-S1. Vacuum disc is present at L3-4 and L4-5. Degenerative changes are noted in the SI joints bilaterally. No acute fractures are present. IMPRESSION: 1. No evidence for acute trauma to the chest, abdomen, or pelvis. 2. Bilateral posterior airspace opacities are concerning for aspiration. 3. Coronary artery disease. 4. Bilateral renal cysts. 5. Sigmoid diverticulosis without diverticulitis. 6. Advanced facet degenerative changes in the lower lumbar spine. 7. Degenerative changes of the SI joints bilaterally. 8. 2.9 cm heterogeneous right thyroid nodule is present. Recommend thyroid US (ref: J Am Coll Radiol. 2015 Feb;12(2): 143-50). 9. Aortic Atherosclerosis (ICD10-I70.0). Electronically Signed   By: San Morelle M.D.   On: 12/28/2020 17:26   DG Chest Port 1 View  Result Date: 12/28/2020 CLINICAL DATA:  Trauma, fall down multiple stairs. EXAM: PORTABLE CHEST 1 VIEW COMPARISON:  None. FINDINGS: Endotracheal tube tip 2.0 cm from the carina. Tip and side port of the enteric tube below the diaphragm. Mild cardiomegaly. No visualized pneumothorax or large pleural effusion. No confluent airspace disease. Suspected retrocardiac atelectasis. No displaced rib fractures seen. IMPRESSION: 1. Endotracheal and enteric tubes in place. 2. Mild cardiomegaly.  Retrocardiac atelectasis. 3. No obvious traumatic injury. Electronically Signed   By: Keith Rake M.D.   On: 12/28/2020 16:44   CT Maxillofacial Wo Contrast  Result Date: 12/28/2020 CLINICAL DATA:  Fall down stairs.  Facial trauma. EXAM: CT MAXILLOFACIAL WITHOUT CONTRAST TECHNIQUE: Multidetector CT imaging of the maxillofacial structures was performed. Multiplanar CT image reconstructions were also generated. COMPARISON:  None. FINDINGS: Osseous: Longitudinal right temporal bone fracture extends through the middle ear cavity. The minimally displaced right occipital  bone fracture is also noted. No acute facial fractures are present. Mandible is intact and located. Cervical spine is within normal limits. Orbits: The globes and orbits are within normal limits. Sinuses: Mucosal thickening is present throughout the ethmoid air cells and inferior frontal sinuses. Fluid is present in the oropharynx, likely related to intubation. Soft tissues: Facial soft tissues are unremarkable. Limited intracranial: Extra-axial hemorrhage present over the right convexity. Bilateral frontal lobe subarachnoid hemorrhage present. Pneumocephalus noted. IMPRESSION: 1. Longitudinal right temporal bone fracture extends through the middle ear cavity. 2. Minimally displaced right occipital bone fracture. 3. Extra-axial hemorrhage over the right convexity. 4. Bilateral frontal lobe subarachnoid hemorrhage. Critical Value/emergent results were called by telephone at the time of interpretation on 12/28/2020 at 4:45 pm to provider Reather Laurence , who verbally acknowledged these results. Electronically Signed   By: San Morelle M.D.   On: 12/28/2020 17:29    Anti-infectives: Anti-infectives (From admission, onward)    None       Assessment/Plan: Fall down stairs VDRF  TBI/SAH/SDH - repeat head ct at noon today Occipital bone fx - Per NSGY Temporal bone fx extending into middle ear - ENT following Right TM Rupture - ENT following Hx DM2 - SSI Hx HTN - PRN meds C-Spine - was cleared apparently by surgery yesterday FEN - NPO, IVF, OGT to LIWS VTE - SCDs, hold lovenox until cleared by nsurg ID - None currently  Foley - Place for I/O monitoring  Dispo - Admit to inpatient, ICU. Await ct today, if ok then will work to wean and extubate in am  I spent 33 minutes cc time  Rodman Key  Donne Hazel 12/29/2020

## 2020-12-30 LAB — BASIC METABOLIC PANEL
Anion gap: 10 (ref 5–15)
BUN: 15 mg/dL (ref 8–23)
CO2: 21 mmol/L — ABNORMAL LOW (ref 22–32)
Calcium: 8.5 mg/dL — ABNORMAL LOW (ref 8.9–10.3)
Chloride: 106 mmol/L (ref 98–111)
Creatinine, Ser: 0.87 mg/dL (ref 0.61–1.24)
GFR, Estimated: >60 mL/min (ref >60)
Glucose, Bld: 102 mg/dL — ABNORMAL HIGH (ref 70–99)
Potassium: 3.4 mmol/L — ABNORMAL LOW (ref 3.5–5.1)
Sodium: 137 mmol/L (ref 135–145)

## 2020-12-30 LAB — CBC
HCT: 32.7 % — ABNORMAL LOW (ref 39.0–52.0)
Hemoglobin: 10.3 g/dL — ABNORMAL LOW (ref 13.0–17.0)
MCH: 25.8 pg — ABNORMAL LOW (ref 26.0–34.0)
MCHC: 31.5 g/dL (ref 30.0–36.0)
MCV: 81.8 fL (ref 80.0–100.0)
Platelets: 168 10*3/uL (ref 150–400)
RBC: 4 MIL/uL — ABNORMAL LOW (ref 4.22–5.81)
RDW: 16.1 % — ABNORMAL HIGH (ref 11.5–15.5)
WBC: 9.6 10*3/uL (ref 4.0–10.5)
nRBC: 0 % (ref 0.0–0.2)

## 2020-12-30 LAB — GLUCOSE, CAPILLARY
Glucose-Capillary: 115 mg/dL — ABNORMAL HIGH (ref 70–99)
Glucose-Capillary: 137 mg/dL — ABNORMAL HIGH (ref 70–99)
Glucose-Capillary: 117 mg/dL — ABNORMAL HIGH (ref 70–99)
Glucose-Capillary: 114 mg/dL — ABNORMAL HIGH (ref 70–99)
Glucose-Capillary: 135 mg/dL — ABNORMAL HIGH (ref 70–99)
Glucose-Capillary: 125 mg/dL — ABNORMAL HIGH (ref 70–99)

## 2020-12-30 MED ORDER — SODIUM CHLORIDE 0.9 % IV SOLN: INTRAVENOUS | Status: DC

## 2020-12-30 MED ORDER — POTASSIUM CHLORIDE 10 MEQ/100ML IV SOLN: 10.0000 meq | INTRAVENOUS | Status: DC

## 2020-12-30 MED ORDER — KCL IN DEXTROSE-NACL 20-5-0.45 MEQ/L-%-% IV SOLN: INTRAVENOUS | Status: DC

## 2020-12-30 MED ORDER — POTASSIUM CHLORIDE 20 MEQ PO PACK
40.0000 meq | PACK | Freq: Two times a day (BID) | ORAL | Status: DC
  Administered 2020-12-30 – 2021-01-09 (×20): 40 meq
  Filled 2020-12-30 (×20): qty 2

## 2020-12-30 NOTE — Progress Notes (Signed)
No new issues or problems overnight.  Patient remains intubated and sedated.  On wake-up assessment the patient will open his eyes and is strongly purposeful bilaterally but does not reliably follow commands.  Follow-up head CT scan demonstrates evidence of significant bifrontal and subfrontal contusions with some scattered traumatic subarachnoid hemorrhage.  No evidence of significant mass-effect.  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  Continue supportive efforts.  Okay to wean off ventilator as clinical situation allows.

## 2020-12-30 NOTE — Progress Notes (Addendum)
Subjective/Chief Complaint: Inttubated, he is arousable but does not specifically follow commands   Objective: Vital signs in last 24 hours: Temp:  [99.4 F (37.4 C)-100.3 F (37.9 C)] 99.7 F (37.6 C) (08/14 0800) Pulse Rate:  [56-81] 65 (08/14 0800) Resp:  [12-20] 20 (08/14 0800) BP: (105-138)/(62-117) 117/66 (08/14 0800) SpO2:  [92 %-100 %] 96 % (08/14 0800) FiO2 (%):  [40 %] 40 % (08/14 0741) Last BM Date:  (PTA)  Intake/Output from previous day: 08/13 0701 - 08/14 0700 In: 3994.3 [I.V.:3794.2; IV Piggyback:200.1] Out: 800 [Urine:800] Intake/Output this shift: Total I/O In: 363.1 [I.V.:263.1; IV Piggyback:100] Out: 100 [Urine:100]  General: sedated HEENT:  PERRL R ear with gauze in place  Neck: c collar removed previously,  not able to assess tenderness CV RRR Lungs: clear Abd: Soft, NT/ND MS: No gross deformities and joint swelling.No BUE/BLE edema Skin: warm and dry with no masses, lesions, or rashes Neuro: sedated now, he does open eyes and mae but does not specifically follow commands   Lab Results:  Recent Labs    12/29/20 0638 12/30/20 0214  WBC 7.2 9.6  HGB 9.5* 10.3*  HCT 29.2* 32.7*  PLT 179 168   BMET Recent Labs    12/29/20 0638 12/30/20 0214  NA 136 137  K 3.8 3.4*  CL 106 106  CO2 21* 21*  GLUCOSE 148* 102*  BUN 17 15  CREATININE 0.90 0.87  CALCIUM 8.8* 8.5*   PT/INR Recent Labs    12/28/20 1607  LABPROT 14.0  INR 1.1   ABG Recent Labs    12/28/20 1746  PHART 7.339*  HCO3 27.6    Studies/Results: CT HEAD WO CONTRAST (5MM)  Result Date: 12/29/2020 CLINICAL DATA:  Poly trauma, critical, head and cervical spine injury suspected. Follow-up intracranial hemorrhage. EXAM: CT HEAD WITHOUT CONTRAST TECHNIQUE: Contiguous axial images were obtained from the base of the skull through the vertex without intravenous contrast. COMPARISON:  Head CT yesterday. FINDINGS: Brain: No abnormality seen affecting the brainstem or  cerebellum. Hemorrhagic contusion of the right occipital lobe underlying the occipital skull fracture. Numerous hemorrhagic contusions of the anterior frontal lobes on both sides. No temporal tip injury is seen. Subarachnoid hemorrhage similar to the previous study, without additional bleeding being evident. Ventricular size is stable. No evidence of midline shift or brain herniation. Small amount of subdural blood along the falx. Vascular: No acute vascular finding otherwise. Skull: Right occipital skull fracture and extensive temporal bone fracture as previously described. Sinuses/Orbits: Previous functional endoscopic sinus surgery. Paranasal sinuses are unremarkable otherwise. Fluid throughout the right mastoid air cells and middle ear related to the temporal bone fracture. Other: None IMPRESSION: Hemorrhagic contusion of the right occipital lobe and both anterior frontal lobes. Extensive subarachnoid hemorrhage as seen previously. Subdural blood along the falx and tentorium. No hydrocephalus. No new or unexpected finding at this time. Right occipital fracture and temporal bone fracture as described previously. Electronically Signed   By: Nelson Chimes M.D.   On: 12/29/2020 12:26   CT HEAD WO CONTRAST  Result Date: 12/28/2020 CLINICAL DATA:  Level 1 trauma. Fall down 8 stairs. Positive loss of consciousness. Bleeding from the right ear. EXAM: CT HEAD WITHOUT CONTRAST TECHNIQUE: Contiguous axial images were obtained from the base of the skull through the vertex without intravenous contrast. COMPARISON:  None. FINDINGS: Brain: Subarachnoid hemorrhage is present over the posterior right temporal lobe and parietal lobe. Additional extra-axial hemorrhage is present over the right convexity. Areas of subarachnoid hemorrhage are  present in the anterior frontal lobes bilaterally with blood along the falx likely subdural. No parenchymal hemorrhage is present. No acute infarct is present. No mass lesion. Ventricles are  of normal size. The brainstem and cerebellum are within normal limits. Vascular: Atherosclerotic calcifications are present within the cavernous internal carotid arteries. No hyperdense vessel is present. Skull: Minimally displaced right occipital skull fracture is present. A longitudinal right temporal bone fracture extends through the right middle ear cavity. Carotid canal is intact. Inner ear structures are intact. Sinuses/Orbits: Mild mucosal thickening is present throughout the inferior frontal sinuses, ethmoid air cells and right maxillary sinus. Fluid is present in the left sphenoid sinus. Other: IMPRESSION: 1. Subarachnoid hemorrhage over the posterior right temporal lobe and parietal lobe. 2. Additional extra-axial hemorrhage over the right convexity. 3. Areas of subarachnoid hemorrhage in the anterior frontal lobes bilaterally with blood along the falx likely subdural. 4. Minimally displaced right occipital skull fracture. 5. A longitudinal right temporal bone fracture extends through the right middle ear cavity. Critical Value/emergent results were called by telephone at the time of interpretation on 12/28/2020 at 4:45 pm to provider Dr. Bobbye Morton, who verbally acknowledged these results. Electronically Signed   By: San Morelle M.D.   On: 12/28/2020 17:10   CT CERVICAL SPINE WO CONTRAST  Result Date: 12/28/2020 CLINICAL DATA:  Fall down stairs.  Unconscious. EXAM: CT CERVICAL SPINE WITHOUT CONTRAST TECHNIQUE: Multidetector CT imaging of the cervical spine was performed without intravenous contrast. Multiplanar CT image reconstructions were also generated. COMPARISON:  None. FINDINGS: Alignment: No significant listhesis is present. Skull base and vertebrae: No acute fracture. No primary bone lesion or focal pathologic process. Soft tissues and spinal canal: No prevertebral fluid or swelling. No visible canal hematoma. Disc levels: Prominent disc protrusion present at C4-5 moderate to severe  central canal stenosis. Moderate central canal stenosis is present at C3-4 due to a central disc protrusion. Multilevel foraminal narrowing is due to uncovertebral spurring, greatest at C4-5 and C6-7. Upper chest: The lung apices are clear. IMPRESSION: 1. No acute fracture or traumatic subluxation. 2. Multilevel degenerative changes of the cervical spine as described. These results were called by telephone at the time of interpretation on 12/28/2020 at 4:45 pm to provider Dr. Bobbye Morton, who verbally acknowledged these results. Electronically Signed   By: San Morelle M.D.   On: 12/28/2020 17:18   DG Pelvis Portable  Result Date: 12/28/2020 CLINICAL DATA:  Trauma, fall. EXAM: PORTABLE PELVIS 1-2 VIEWS COMPARISON:  None. FINDINGS: Technically limited exam due to body habitus. The cortical margins of the bony pelvis are grossly visualized intact. No fracture. Pubic symphysis and sacroiliac joints are congruent. Both femoral heads are well-seated in the respective acetabula. IMPRESSION: Technically limited exam due to body habitus. No evident pelvic fracture. Electronically Signed   By: Keith Rake M.D.   On: 12/28/2020 16:45   CT CHEST ABDOMEN PELVIS W CONTRAST  Result Date: 12/28/2020 CLINICAL DATA:  Abdominal trauma.  Fall down stairs. EXAM: CT CHEST, ABDOMEN, AND PELVIS WITH CONTRAST TECHNIQUE: Multidetector CT imaging of the chest, abdomen and pelvis was performed following the standard protocol during bolus administration of intravenous contrast. CONTRAST:  131mL OMNIPAQUE IOHEXOL 350 MG/ML SOLN COMPARISON:  None. FINDINGS: CT CHEST FINDINGS Cardiovascular: Heart size normal. Coronary artery calcifications are present. Atherosclerotic calcifications are present at the aortic arch without aneurysm or stenosis. Pulmonary artery size is normal. Mediastinum/Nodes: 2.9 cm heterogeneous right thyroid nodule is present. Thoracic inlet is otherwise within normal limits. No significant  mediastinal, hilar,  or axillary adenopathy is present. Patient is intubated. Endotracheal tube is in satisfactory position. NG tube is in place. Lungs/Pleura: Bilateral posterior airspace opacities are present. Bilaterally posterior and medial lower lobe airspace opacities are present. No pneumothorax is present. No focal contusion is present. Musculoskeletal: No acute fractures. CT ABDOMEN PELVIS FINDINGS Hepatobiliary: No hepatic injury or perihepatic hematoma. Gallbladder is unremarkable. Pancreas: Unremarkable. No pancreatic ductal dilatation or surrounding inflammatory changes. Spleen: No splenic injury or perisplenic hematoma. Adrenals/Urinary Tract: Adrenal glands are normal bilaterally. Bilateral renal cysts are present. Slightly higher density 2 cm exophytic cyst is present at the lower pole of the left kidney. 10 mm exophytic cyst in the mid left kidney is of similar density. No solid mass lesion is present. Ureters are within normal limits. The urinary bladder is within limits. Stomach/Bowel: Stomach is within normal limits. Appendix appears normal. No evidence of bowel wall thickening, distention, or inflammatory changes. Diverticular changes are present in the distal descending and proximal sigmoid colon without inflammation. Vascular/Lymphatic: Atherosclerotic calcifications are present within the aorta and branch vessels without aneurysm. Reproductive: Prostate is unremarkable. Other: No abdominal wall hernia or abnormality. No abdominopelvic ascites. Musculoskeletal: Advanced facet degenerative changes present at L3-4, L4-5, and L5-S1. Vacuum disc is present at L3-4 and L4-5. Degenerative changes are noted in the SI joints bilaterally. No acute fractures are present. IMPRESSION: 1. No evidence for acute trauma to the chest, abdomen, or pelvis. 2. Bilateral posterior airspace opacities are concerning for aspiration. 3. Coronary artery disease. 4. Bilateral renal cysts. 5. Sigmoid diverticulosis without diverticulitis. 6.  Advanced facet degenerative changes in the lower lumbar spine. 7. Degenerative changes of the SI joints bilaterally. 8. 2.9 cm heterogeneous right thyroid nodule is present. Recommend thyroid US (ref: J Am Coll Radiol. 2015 Feb;12(2): 143-50). 9. Aortic Atherosclerosis (ICD10-I70.0). Electronically Signed   By: San Morelle M.D.   On: 12/28/2020 17:26   DG Chest Port 1 View  Result Date: 12/28/2020 CLINICAL DATA:  Trauma, fall down multiple stairs. EXAM: PORTABLE CHEST 1 VIEW COMPARISON:  None. FINDINGS: Endotracheal tube tip 2.0 cm from the carina. Tip and side port of the enteric tube below the diaphragm. Mild cardiomegaly. No visualized pneumothorax or large pleural effusion. No confluent airspace disease. Suspected retrocardiac atelectasis. No displaced rib fractures seen. IMPRESSION: 1. Endotracheal and enteric tubes in place. 2. Mild cardiomegaly.  Retrocardiac atelectasis. 3. No obvious traumatic injury. Electronically Signed   By: Keith Rake M.D.   On: 12/28/2020 16:44   CT Maxillofacial Wo Contrast  Result Date: 12/28/2020 CLINICAL DATA:  Fall down stairs.  Facial trauma. EXAM: CT MAXILLOFACIAL WITHOUT CONTRAST TECHNIQUE: Multidetector CT imaging of the maxillofacial structures was performed. Multiplanar CT image reconstructions were also generated. COMPARISON:  None. FINDINGS: Osseous: Longitudinal right temporal bone fracture extends through the middle ear cavity. The minimally displaced right occipital bone fracture is also noted. No acute facial fractures are present. Mandible is intact and located. Cervical spine is within normal limits. Orbits: The globes and orbits are within normal limits. Sinuses: Mucosal thickening is present throughout the ethmoid air cells and inferior frontal sinuses. Fluid is present in the oropharynx, likely related to intubation. Soft tissues: Facial soft tissues are unremarkable. Limited intracranial: Extra-axial hemorrhage present over the right  convexity. Bilateral frontal lobe subarachnoid hemorrhage present. Pneumocephalus noted. IMPRESSION: 1. Longitudinal right temporal bone fracture extends through the middle ear cavity. 2. Minimally displaced right occipital bone fracture. 3. Extra-axial hemorrhage over the right convexity. 4. Bilateral frontal  lobe subarachnoid hemorrhage. Critical Value/emergent results were called by telephone at the time of interpretation on 12/28/2020 at 4:45 pm to provider Reather Laurence , who verbally acknowledged these results. Electronically Signed   By: San Morelle M.D.   On: 12/28/2020 17:29    Anti-infectives: Anti-infectives (From admission, onward)    None       Assessment/Plan: Fall down stairs VDRF  TBI/SAH/SDH - nsurg following, I dont think due to mental status ready to extubate today, discussed with Sarah and if changes will call Occipital bone fx - Per NSGY Temporal bone fx extending into middle ear - ENT following Right TM Rupture - ENT following Hx DM2 - SSI Hx HTN - PRN meds Hypokalemia- replace today, change iv fluids C-Spine - was cleared apparently by surgery at admission FEN - NPO, IVF, OGT to LIWS VTE - SCDs, hold lovenox until cleared by nsurg-please comment today on starting ID - None currently  Foley -remove today Dispo - ICU I spent 32 minutes cc time  Rolm Bookbinder 12/30/2020

## 2020-12-31 ENCOUNTER — Inpatient Hospital Stay (HOSPITAL_COMMUNITY): Payer: PPO

## 2020-12-31 DIAGNOSIS — Z4682 Encounter for fitting and adjustment of non-vascular catheter: Secondary | ICD-10-CM | POA: Diagnosis not present

## 2020-12-31 LAB — CBC
HCT: 29.7 % — ABNORMAL LOW (ref 39.0–52.0)
Hemoglobin: 9.4 g/dL — ABNORMAL LOW (ref 13.0–17.0)
MCH: 25.9 pg — ABNORMAL LOW (ref 26.0–34.0)
MCHC: 31.6 g/dL (ref 30.0–36.0)
MCV: 81.8 fL (ref 80.0–100.0)
Platelets: 162 10*3/uL (ref 150–400)
RBC: 3.63 MIL/uL — ABNORMAL LOW (ref 4.22–5.81)
RDW: 16 % — ABNORMAL HIGH (ref 11.5–15.5)
WBC: 10.9 10*3/uL — ABNORMAL HIGH (ref 4.0–10.5)
nRBC: 0 % (ref 0.0–0.2)

## 2020-12-31 LAB — BASIC METABOLIC PANEL
Anion gap: 7 (ref 5–15)
BUN: 12 mg/dL (ref 8–23)
CO2: 20 mmol/L — ABNORMAL LOW (ref 22–32)
Calcium: 8.2 mg/dL — ABNORMAL LOW (ref 8.9–10.3)
Chloride: 108 mmol/L (ref 98–111)
Creatinine, Ser: 0.73 mg/dL (ref 0.61–1.24)
GFR, Estimated: >60 mL/min (ref >60)
Glucose, Bld: 129 mg/dL — ABNORMAL HIGH (ref 70–99)
Potassium: 3.9 mmol/L (ref 3.5–5.1)
Sodium: 135 mmol/L (ref 135–145)

## 2020-12-31 LAB — GLUCOSE, CAPILLARY
Glucose-Capillary: 112 mg/dL — ABNORMAL HIGH (ref 70–99)
Glucose-Capillary: 118 mg/dL — ABNORMAL HIGH (ref 70–99)
Glucose-Capillary: 159 mg/dL — ABNORMAL HIGH (ref 70–99)
Glucose-Capillary: 163 mg/dL — ABNORMAL HIGH (ref 70–99)
Glucose-Capillary: 190 mg/dL — ABNORMAL HIGH (ref 70–99)
Glucose-Capillary: 191 mg/dL — ABNORMAL HIGH (ref 70–99)

## 2020-12-31 LAB — PHOSPHORUS
Phosphorus: 2.3 mg/dL — ABNORMAL LOW (ref 2.5–4.6)
Phosphorus: 1.6 mg/dL — ABNORMAL LOW (ref 2.5–4.6)

## 2020-12-31 LAB — MAGNESIUM
Magnesium: 1.8 mg/dL (ref 1.7–2.4)
Magnesium: 1.6 mg/dL — ABNORMAL LOW (ref 1.7–2.4)

## 2020-12-31 MED ORDER — LEVETIRACETAM 100 MG/ML PO SOLN
500.0000 mg | Freq: Two times a day (BID) | ORAL | Status: AC
  Administered 2020-12-31 – 2021-01-04 (×8): 500 mg
  Filled 2020-12-31 (×8): qty 5

## 2020-12-31 MED ORDER — BETHANECHOL CHLORIDE 10 MG PO TABS
10.0000 mg | ORAL_TABLET | Freq: Three times a day (TID) | ORAL | Status: DC
  Administered 2020-12-31 – 2021-01-01 (×4): 10 mg
  Filled 2020-12-31 (×4): qty 1

## 2020-12-31 MED ORDER — VITAL HIGH PROTEIN PO LIQD: 1000.0000 mL | ORAL | Status: DC

## 2020-12-31 MED ORDER — PROSOURCE TF PO LIQD
45.0000 mL | Freq: Two times a day (BID) | ORAL | Status: DC
  Administered 2020-12-31: 45 mL
  Filled 2020-12-31: qty 45

## 2020-12-31 MED ORDER — BETHANECHOL CHLORIDE 10 MG PO TABS: 10.0000 mg | ORAL_TABLET | Freq: Three times a day (TID) | ORAL | Status: DC

## 2020-12-31 MED ORDER — PIVOT 1.5 CAL PO LIQD
1000.0000 mL | ORAL | Status: DC
  Administered 2020-12-31 – 2021-01-30 (×24): 1000 mL
  Filled 2020-12-31 (×2): qty 1000

## 2020-12-31 NOTE — Procedures (Signed)
Cortrak  Person Inserting Tube:  Angel [name], RD Tube Type:  Cortrak - 43 inches Tube Size:  10 Tube Location:  Left nare Initial Placement:  Stomach Secured by: Bridle Technique Used to Measure Tube Placement:  Marking at nare/corner of mouth Cortrak Secured At:  73 cm Procedure Comments:  Cortrak Tube Team Note:  Consult received to place a Cortrak feeding tube.   X-ray is required, abdominal x-ray has been ordered by the Cortrak team. Please confirm tube placement before using the Cortrak tube.   If the tube becomes dislodged please keep the tube and contact the Cortrak team at [email or web address] (password [email or web address]) for replacement.  If after hours and replacement cannot be delayed, place a NG tube and confirm placement with an abdominal x-ray.    Angel Single MS, RD, LDN, CNSC Clinical Nutrition Pager listed in [place]

## 2020-12-31 NOTE — Progress Notes (Signed)
   Providing Compassionate, Quality Care - Together   Subjective: Intubated, awake.  Objective: Vital signs in last 24 hours: Temp:  [97.8 F (36.6 C)-99.8 F (37.7 C)] 97.8 F (36.6 C) (08/15 0800) Pulse Rate:  [56-95] 91 (08/15 0900) Resp:  [15-21] 18 (08/15 0900) BP: (103-142)/(62-72) 142/67 (08/15 0900) SpO2:  [91 %-100 %] 97 % (08/15 0907) FiO2 (%):  [40 %] 40 % (08/15 0907)  Intake/Output from previous day: 08/14 0701 - 08/15 0700 In: 3434.3 [I.V.:3134.3; IV Piggyback:300] Out: 975 [Urine:975] Intake/Output this shift: Total I/O In: 113.8 [I.V.:113.8] Out: -   Alert, responds to voice PERRLA MAE, unable to follow commands OG in place Intubated   Lab Results: Recent Labs    12/30/20 0214 12/31/20 0133  WBC 9.6 10.9*  HGB 10.3* 9.4*  HCT 32.7* 29.7*  PLT 168 162   BMET Recent Labs    12/30/20 0214 12/31/20 0133  NA 137 135  K 3.4* 3.9  CL 106 108  CO2 21* 20*  GLUCOSE 102* 129*  BUN 15 12  CREATININE 0.87 0.73  CALCIUM 8.5* 8.2*    Studies/Results: CT HEAD WO CONTRAST (5MM)  Result Date: 12/29/2020 CLINICAL DATA:  Poly trauma, critical, head and cervical spine injury suspected. Follow-up intracranial hemorrhage. EXAM: CT HEAD WITHOUT CONTRAST TECHNIQUE: Contiguous axial images were obtained from the base of the skull through the vertex without intravenous contrast. COMPARISON:  Head CT yesterday. FINDINGS: Brain: No abnormality seen affecting the brainstem or cerebellum. Hemorrhagic contusion of the right occipital lobe underlying the occipital skull fracture. Numerous hemorrhagic contusions of the anterior frontal lobes on both sides. No temporal tip injury is seen. Subarachnoid hemorrhage similar to the previous study, without additional bleeding being evident. Ventricular size is stable. No evidence of midline shift or brain herniation. Small amount of subdural blood along the falx. Vascular: No acute vascular finding otherwise. Skull: Right  occipital skull fracture and extensive temporal bone fracture as previously described. Sinuses/Orbits: Previous functional endoscopic sinus surgery. Paranasal sinuses are unremarkable otherwise. Fluid throughout the right mastoid air cells and middle ear related to the temporal bone fracture. Other: None IMPRESSION: Hemorrhagic contusion of the right occipital lobe and both anterior frontal lobes. Extensive subarachnoid hemorrhage as seen previously. Subdural blood along the falx and tentorium. No hydrocephalus. No new or unexpected finding at this time. Right occipital fracture and temporal bone fracture as described previously. Electronically Signed   By: Nelson Chimes M.D.   On: 12/29/2020 12:26    Assessment/Plan: Patient presented to the [facility] ED on 12/28/2020 with SAH and skull fractures following a fall down about 8 steps. He remains intubated and sedated for airway protection. No Neurosurgical intervention recommended. Patient not ready for extubation due to mental status.   LOS: 3 days   -Continue supportive care -Can restart Lovenox on 12/06/2020  Viona Gilmore, DNP, AGNP-C Nurse Practitioner  [facility] [address], [address], [place], [place] [address] P: [phone]    F: [phone]  12/31/2020, 9:48 AM

## 2020-12-31 NOTE — Progress Notes (Signed)
Subjective/Chief Complaint: Intubated, he is clearly awake and tracking but does not specifically follow commands   Objective: Vital signs in last 24 hours: Temp:  [97.8 F (36.6 C)-99.8 F (37.7 C)] 97.8 F (36.6 C) (08/15 0800) Pulse Rate:  [56-95] 95 (08/15 0800) Resp:  [15-21] 18 (08/15 0800) BP: (103-138)/(62-94) 110/68 (08/15 0751) SpO2:  [91 %-100 %] 91 % (08/15 0800) FiO2 (%):  [40 %] 40 % (08/15 0800) Last BM Date:  (PTA)  Intake/Output from previous day: 08/14 0701 - 08/15 0700 In: 3434.3 [I.V.:3134.3; IV Piggyback:300] Out: 975 [Urine:975] Intake/Output this shift: No intake/output data recorded.  General: sedated HEENT:  PERRL R ear with gauze in place  Neck: c collar removed previously,  not able to assess tenderness CV RRR Lungs: clear Abd: Soft, nondistended MS: No gross deformities and joint swelling. No BUE/BLE edema Skin: warm and dry with no masses, lesions, or rashes Neuro: Awakens to voice, and has mild agitation. Not following commands.  Lab Results:  Recent Labs    12/30/20 0214 12/31/20 0133  WBC 9.6 10.9*  HGB 10.3* 9.4*  HCT 32.7* 29.7*  PLT 168 162   BMET Recent Labs    12/30/20 0214 12/31/20 0133  NA 137 135  K 3.4* 3.9  CL 106 108  CO2 21* 20*  GLUCOSE 102* 129*  BUN 15 12  CREATININE 0.87 0.73  CALCIUM 8.5* 8.2*   PT/INR Recent Labs    12/28/20 1607  LABPROT 14.0  INR 1.1   ABG Recent Labs    12/28/20 1746  PHART 7.339*  HCO3 27.6    Studies/Results: CT HEAD WO CONTRAST (5MM)  Result Date: 12/29/2020 CLINICAL DATA:  Poly trauma, critical, head and cervical spine injury suspected. Follow-up intracranial hemorrhage. EXAM: CT HEAD WITHOUT CONTRAST TECHNIQUE: Contiguous axial images were obtained from the base of the skull through the vertex without intravenous contrast. COMPARISON:  Head CT yesterday. FINDINGS: Brain: No abnormality seen affecting the brainstem or cerebellum. Hemorrhagic contusion of the  right occipital lobe underlying the occipital skull fracture. Numerous hemorrhagic contusions of the anterior frontal lobes on both sides. No temporal tip injury is seen. Subarachnoid hemorrhage similar to the previous study, without additional bleeding being evident. Ventricular size is stable. No evidence of midline shift or brain herniation. Small amount of subdural blood along the falx. Vascular: No acute vascular finding otherwise. Skull: Right occipital skull fracture and extensive temporal bone fracture as previously described. Sinuses/Orbits: Previous functional endoscopic sinus surgery. Paranasal sinuses are unremarkable otherwise. Fluid throughout the right mastoid air cells and middle ear related to the temporal bone fracture. Other: None IMPRESSION: Hemorrhagic contusion of the right occipital lobe and both anterior frontal lobes. Extensive subarachnoid hemorrhage as seen previously. Subdural blood along the falx and tentorium. No hydrocephalus. No new or unexpected finding at this time. Right occipital fracture and temporal bone fracture as described previously. Electronically Signed   By: Nelson Chimes M.D.   On: 12/29/2020 12:26    Anti-infectives: Anti-infectives (From admission, onward)    None       Assessment/Plan: Fall down stairs 8/12 VDRF - significant secretions, mental status not such that extubation possible. Check cxr today. Brown high volume secretions, check tracheal aspirate cx. TBI/SAH/SDH - nsurg following, still not following commands but continues to wake to voice. Significant frontal lobe injuries. Not able to extubate at this juncture. Occipital bone fx - Per NSGY Temporal bone fx extending into middle ear - ENT following Right TM Rupture - ENT  following Hx DM2 - SSI Hx HTN - PRN meds Hypokalemia- resolved C-Spine - was cleared apparently by surgery at admission FEN - NPO, IVF, place cortrak, start tube feeds given significant head injury, anticipate prolonged  course prior to achieving oral intake VTE - SCDs, hold lovenox until cleared by nsurg-please comment today on starting ID - None currently  Foley - urinary retention, start urecholine  Dispo - ICU  CRITICAL CARE Performed by: Ileana Roup   Total critical care time: 55 minutes  Critical care time was exclusive of separately billable procedures and treating other patients.  Critical care was necessary to treat or prevent imminent or life-threatening deterioration.  Critical care was time spent personally by me on the following activities: development of treatment plan with patient and/or surrogate as well as nursing, discussions with consultants, evaluation of patient's response to treatment, examination of patient, obtaining history from patient or surrogate, ordering and performing treatments and interventions, ordering and review of laboratory studies, ordering and review of radiographic studies, pulse oximetry and re-evaluation of patient's condition.  Sharon Mt Adamarys Shall 12/31/2020

## 2020-12-31 NOTE — Progress Notes (Signed)
Initial Nutrition Assessment  DOCUMENTATION CODES:   Obesity unspecified  INTERVENTION:   Initiate tube feeding via Cortrak tube: Pivot 1.5 at 65 ml/h (1560 ml per day)  Provides 2340 kcal, 146 gm protein, 1184 ml free water daily  TF regimen and propofol at current rate providing 2551 total kcal/day    NUTRITION DIAGNOSIS:   Inadequate oral intake related to inability to eat as evidenced by NPO status.  GOAL:   Patient will meet greater than or equal to 90% of their needs  MONITOR:   TF tolerance  REASON FOR ASSESSMENT:   Consult, Ventilator Enteral/tube feeding initiation and management  ASSESSMENT:   Pt with PMH of DM and HTN admitted after falling down basement stairs with TBI/SAH/SDH, significant frontal lobe injuries, occipital bone fx, temporal bone fx extending into middle ear, and R TM rupture.    Pt discussed during ICU rounds and with RN.  Spoke with wife and son at bedside who report no recent weight changes, good appetite PTA. Pt does use ETOH on a regular basis.   8/15 s/p cortrak placement; tip gastric  Patient is currently intubated on ventilator support MV: 12.8 L/min Temp (24hrs), Avg:98.7 F (37.1 C), Min:97.8 F (36.6 C), Max:99.8 F (37.7 C)  Propofol: 8 ml/hr provides: 211 kcal  Medications reviewed and include: colace, SSI, protonix, miralax, KCl  Fentanyl Labs reviewed: PO4: 2.3 CBG's: 112-159    NUTRITION - FOCUSED PHYSICAL EXAM:  Flowsheet Row Most Recent Value  Orbital Region No depletion  Upper Arm Region No depletion  Thoracic and Lumbar Region No depletion  Buccal Region No depletion  Temple Region No depletion  Clavicle Bone Region No depletion  Clavicle and Acromion Bone Region No depletion  Scapular Bone Region Unable to assess  Dorsal Hand No depletion  Patellar Region No depletion  Anterior Thigh Region No depletion  Posterior Calf Region No depletion  Edema (RD Assessment) Mild  Hair Reviewed  Eyes Reviewed   Mouth Unable to assess  Skin Reviewed  Nails Reviewed       Diet Order:   Diet Order             Diet NPO time specified  Diet effective now                   EDUCATION NEEDS:   Not appropriate for education at this time  Skin:  Skin Assessment: Reviewed RN Assessment  Last BM:  unknown  Height:   Ht Readings from Last 1 Encounters:  12/28/20 6\' 2"  (1.88 m)    Weight:   Wt Readings from Last 1 Encounters:  12/28/20 133.8 kg    Ideal Body Weight:     BMI:  Body mass index is 37.88 kg/m.  Estimated Nutritional Needs:   Kcal:  2200-2600  Protein:  135-150 grams  Fluid:  > 2 L/day  Lockie Pares., RD, LDN, CNSC See AMiON for contact information

## 2021-01-01 ENCOUNTER — Inpatient Hospital Stay (HOSPITAL_COMMUNITY): Payer: PPO

## 2021-01-01 DIAGNOSIS — R918 Other nonspecific abnormal finding of lung field: Secondary | ICD-10-CM | POA: Diagnosis not present

## 2021-01-01 DIAGNOSIS — Z4682 Encounter for fitting and adjustment of non-vascular catheter: Secondary | ICD-10-CM | POA: Diagnosis not present

## 2021-01-01 LAB — TRIGLYCERIDES: Triglycerides: 180 mg/dL — ABNORMAL HIGH (ref <150)

## 2021-01-01 LAB — BASIC METABOLIC PANEL
Anion gap: 7 (ref 5–15)
BUN: 20 mg/dL (ref 8–23)
CO2: 22 mmol/L (ref 22–32)
Calcium: 8.3 mg/dL — ABNORMAL LOW (ref 8.9–10.3)
Chloride: 111 mmol/L (ref 98–111)
Creatinine, Ser: 0.74 mg/dL (ref 0.61–1.24)
GFR, Estimated: >60 mL/min (ref >60)
Glucose, Bld: 258 mg/dL — ABNORMAL HIGH (ref 70–99)
Potassium: 4.1 mmol/L (ref 3.5–5.1)
Sodium: 140 mmol/L (ref 135–145)

## 2021-01-01 LAB — CBC
HCT: 26.9 % — ABNORMAL LOW (ref 39.0–52.0)
Hemoglobin: 8.5 g/dL — ABNORMAL LOW (ref 13.0–17.0)
MCH: 25.8 pg — ABNORMAL LOW (ref 26.0–34.0)
MCHC: 31.6 g/dL (ref 30.0–36.0)
MCV: 81.5 fL (ref 80.0–100.0)
Platelets: 185 10*3/uL (ref 150–400)
RBC: 3.3 MIL/uL — ABNORMAL LOW (ref 4.22–5.81)
RDW: 16 % — ABNORMAL HIGH (ref 11.5–15.5)
WBC: 9.1 10*3/uL (ref 4.0–10.5)
nRBC: 0 % (ref 0.0–0.2)

## 2021-01-01 LAB — GLUCOSE, CAPILLARY
Glucose-Capillary: 171 mg/dL — ABNORMAL HIGH (ref 70–99)
Glucose-Capillary: 247 mg/dL — ABNORMAL HIGH (ref 70–99)
Glucose-Capillary: 226 mg/dL — ABNORMAL HIGH (ref 70–99)
Glucose-Capillary: 232 mg/dL — ABNORMAL HIGH (ref 70–99)
Glucose-Capillary: 233 mg/dL — ABNORMAL HIGH (ref 70–99)
Glucose-Capillary: 205 mg/dL — ABNORMAL HIGH (ref 70–99)

## 2021-01-01 LAB — PHOSPHORUS: Phosphorus: 1.8 mg/dL — ABNORMAL LOW (ref 2.5–4.6)

## 2021-01-01 LAB — MAGNESIUM: Magnesium: 1.9 mg/dL (ref 1.7–2.4)

## 2021-01-01 MED ORDER — GUAIFENESIN 100 MG/5ML PO SOLN
10.0000 mL | ORAL | Status: DC
  Administered 2021-01-01 – 2021-01-22 (×116): 200 mg
  Administered 2021-01-22: 150 mg
  Administered 2021-01-23 – 2021-02-01 (×55): 200 mg
  Administered 2021-02-01: 150 mg
  Administered 2021-02-01 – 2021-02-08 (×39): 200 mg
  Filled 2021-01-01 (×8): qty 15
  Filled 2021-01-01: qty 10
  Filled 2021-01-01: qty 30
  Filled 2021-01-01: qty 10
  Filled 2021-01-01 (×2): qty 15
  Filled 2021-01-01: qty 10
  Filled 2021-01-01 (×44): qty 15
  Filled 2021-01-01 (×2): qty 10
  Filled 2021-01-01 (×9): qty 15
  Filled 2021-01-01: qty 10
  Filled 2021-01-01 (×8): qty 15
  Filled 2021-01-01: qty 10
  Filled 2021-01-01 (×5): qty 15
  Filled 2021-01-01 (×2): qty 10
  Filled 2021-01-01 (×9): qty 15
  Filled 2021-01-01: qty 10
  Filled 2021-01-01 (×3): qty 15
  Filled 2021-01-01: qty 10
  Filled 2021-01-01 (×6): qty 15
  Filled 2021-01-01: qty 10
  Filled 2021-01-01 (×12): qty 15
  Filled 2021-01-01: qty 10
  Filled 2021-01-01 (×9): qty 15
  Filled 2021-01-01: qty 10
  Filled 2021-01-01: qty 30
  Filled 2021-01-01: qty 10
  Filled 2021-01-01 (×9): qty 15
  Filled 2021-01-01: qty 10
  Filled 2021-01-01 (×5): qty 15
  Filled 2021-01-01: qty 10
  Filled 2021-01-01 (×17): qty 15
  Filled 2021-01-01: qty 10
  Filled 2021-01-01 (×26): qty 15
  Filled 2021-01-01: qty 10
  Filled 2021-01-01: qty 15
  Filled 2021-01-01: qty 30
  Filled 2021-01-01 (×13): qty 15

## 2021-01-01 MED ORDER — POTASSIUM & SODIUM PHOSPHATES 280-160-250 MG PO PACK
1.0000 | PACK | Freq: Three times a day (TID) | ORAL | Status: AC
  Administered 2021-01-01 (×3): 1
  Filled 2021-01-01 (×3): qty 1

## 2021-01-01 MED ORDER — MAGNESIUM HYDROXIDE 400 MG/5ML PO SUSP
30.0000 mL | Freq: Once | ORAL | Status: AC
  Administered 2021-01-01: 30 mL
  Filled 2021-01-01: qty 30

## 2021-01-01 MED ORDER — ENOXAPARIN SODIUM 30 MG/0.3ML IJ SOSY: 30.0000 mg | PREFILLED_SYRINGE | Freq: Two times a day (BID) | INTRAMUSCULAR | Status: DC

## 2021-01-01 MED ORDER — SENNA 8.6 MG PO TABS
1.0000 | ORAL_TABLET | Freq: Every day | ORAL | Status: DC
  Administered 2021-01-01 – 2021-02-03 (×22): 8.6 mg
  Filled 2021-01-01 (×27): qty 1

## 2021-01-01 MED ORDER — BETHANECHOL CHLORIDE 25 MG PO TABS
25.0000 mg | ORAL_TABLET | Freq: Three times a day (TID) | ORAL | Status: DC
  Administered 2021-01-01 – 2021-01-18 (×47): 25 mg
  Filled 2021-01-01: qty 1
  Filled 2021-01-01: qty 3
  Filled 2021-01-01 (×2): qty 1
  Filled 2021-01-01: qty 3
  Filled 2021-01-01 (×6): qty 1
  Filled 2021-01-01: qty 3
  Filled 2021-01-01 (×3): qty 1
  Filled 2021-01-01: qty 3
  Filled 2021-01-01: qty 1
  Filled 2021-01-01 (×3): qty 3
  Filled 2021-01-01 (×3): qty 1
  Filled 2021-01-01 (×2): qty 3
  Filled 2021-01-01: qty 1
  Filled 2021-01-01: qty 3
  Filled 2021-01-01: qty 1
  Filled 2021-01-01: qty 3
  Filled 2021-01-01 (×2): qty 1
  Filled 2021-01-01 (×6): qty 3
  Filled 2021-01-01 (×2): qty 1
  Filled 2021-01-01: qty 3
  Filled 2021-01-01 (×2): qty 1
  Filled 2021-01-01 (×2): qty 3
  Filled 2021-01-01: qty 1
  Filled 2021-01-01 (×3): qty 3
  Filled 2021-01-01: qty 1

## 2021-01-01 NOTE — Progress Notes (Signed)
Received in report that patient had been I&Od x4. Discussed with Dr. Bobbye Morton who gave VO for foley and is considering increasing urecholine dosage.

## 2021-01-01 NOTE — TOC Initial Note (Signed)
Transition of Care [facility]) - Initial/Assessment Note    Patient Details  Name: Angel [name] MRN: [id number] Date of Birth: 1950/06/05  [facility]) CM/SW Contact:    Ella Bodo, RN Phone Number: 01/01/2021, 4:03 PM  Clinical Narrative:   Patient admitted on 12/28/2020 after falling down about 8 steps.  Patient sustained TBI/SAH/SDH, occipital bone fracture, temporal bone fracture extending into the middle ear, right TM rupture and vent dependent respiratory failure.  Prior to admission, patient independent and living at home with spouse.  Patient currently remains sedated and on the ventilator for airway protection; will continue to follow as patient progresses.                Barriers to Discharge: Continued Medical Work up          Expected Discharge Plan and Services     Discharge Planning Services: CM Consult   Living arrangements for the past 2 months: Single Family Home                                      Prior Living Arrangements/Services Living arrangements for the past 2 months: Single Family Home Lives with:: Spouse Patient language and need for interpreter reviewed:: Yes              Criminal Activity/Legal Involvement Pertinent to Current Situation/Hospitalization: No - Comment as needed                 Emotional Assessment Appearance:: Appears stated age Attitude/Demeanor/Rapport: Unable to Assess Affect (typically observed): Unable to Assess        Admission diagnosis:  Trauma [T14.90XA] SAH (subarachnoid hemorrhage) ([place]) [I60.9] Fall, initial encounter [W19.XXXA] Patient Active Problem List   Diagnosis Date Noted   SAH (subarachnoid hemorrhage) ([place]) 12/28/2020   PCP:  Merryl Hacker No Pharmacy:   [facility] - [place], [place] - [address] [place] [address] Phone: [phone] Fax: [phone]     Social Determinants of Health (SDOH) Interventions     Readmission Risk Interventions No flowsheet data found.   Reinaldo Raddle, RN, BSN  Trauma/Neuro ICU Case Manager [phone]

## 2021-01-01 NOTE — Progress Notes (Signed)
Inpatient Diabetes Program Recommendations  AACE/ADA: New Consensus Statement on Inpatient Glycemic Control (2015)  Target Ranges:  Prepandial:   less than 140 mg/dL      Peak postprandial:   less than 180 mg/dL (1-2 hours)      Critically ill patients:  140 - 180 mg/dL   Lab Results  Component Value Date   GLUCAP 226 (H) 01/01/2021    Review of Glycemic Control  Diabetes history: DM2 Outpatient Diabetes medications: glipizide 2.5 mg QAM, tradjenta 5 mg QD, metformin 1000 mg BID, Actos 45 mg QD Current orders for Inpatient glycemic control: Novolog 0-15 units Q4H.  TF at 65/H On vent  Inpatient Diabetes Program Recommendations:    Consider adding TF coverage - Novolog 2 units Q4H.  Consider ICU Hyperglycemia Protocol  Continue to follow.  Thank you. Lorenda Peck, RD, LDN, CDE Inpatient Diabetes Coordinator [phone]

## 2021-01-01 NOTE — Progress Notes (Signed)
Trauma/Critical Care Follow Up Note  Subjective:    Overnight Issues:   Objective:  Vital signs for last 24 hours: Temp:  [97.8 F (36.6 C)-99.9 F (37.7 C)] 99.9 F (37.7 C) (08/16 0800) Pulse Rate:  [56-106] 68 (08/16 1000) Resp:  [14-24] 20 (08/16 1000) BP: (101-158)/(55-81) 112/61 (08/16 1000) SpO2:  [86 %-100 %] 90 % (08/16 1000) FiO2 (%):  [50 %-60 %] 60 % (08/16 1000)  Hemodynamic parameters for last 24 hours:    Intake/Output from previous day: 08/15 0701 - 08/16 0700 In: 3519.6 [I.V.:2342; NG/GT:1177.6] Out: 1100 [Urine:1100]  Intake/Output this shift: Total I/O In: 413.2 [I.V.:348.2; NG/GT:65] Out: -   Vent settings for last 24 hours: Vent Mode: PRVC FiO2 (%):  [50 %-60 %] 60 % Set Rate:  [20 bmp] 20 bmp Vt Set:  [650 mL] 650 mL PEEP:  [5 cmH20-10 cmH20] 10 cmH20 Pressure Support:  [12 cmH20] 12 cmH20 Plateau Pressure:  [12 cmH20-27 cmH20] 25 cmH20  Physical Exam:  Gen: comfortable, no distress Neuro: not f/c HEENT: PERRL Neck: supple CV: RRR Pulm: unlabored breathing Abd: soft, distended, no BM GU: clear yellow urine Extr: wwp, no edema   Results for orders placed or performed during the hospital encounter of 12/28/20 (from the past 24 hour(s))  Culture, Respiratory w Gram Stain     Status: None (Preliminary result)   Collection Time: 12/31/20 11:06 AM   Specimen: Tracheal Aspirate; Respiratory  Result Value Ref Range   Specimen Description TRACHEAL ASPIRATE    Special Requests NONE    Gram Stain      FEW SQUAMOUS EPITHELIAL CELLS PRESENT FEW WBC PRESENT,BOTH PMN AND MONONUCLEAR FEW GRAM POSITIVE COCCI    Culture      TOO YOUNG TO READ Performed at [facility], [place] [address], [place], [place] [address]    Report Status PENDING   Magnesium     Status: None   Collection Time: 12/31/20 12:17 PM  Result Value Ref Range   Magnesium 1.8 1.7 - 2.4 mg/dL  Phosphorus     Status: Abnormal   Collection Time: 12/31/20 12:17 PM   Result Value Ref Range   Phosphorus 2.3 (L) 2.5 - 4.6 mg/dL  Glucose, capillary     Status: Abnormal   Collection Time: 12/31/20 12:27 PM  Result Value Ref Range   Glucose-Capillary 159 (H) 70 - 99 mg/dL  Glucose, capillary     Status: Abnormal   Collection Time: 12/31/20  3:58 PM  Result Value Ref Range   Glucose-Capillary 163 (H) 70 - 99 mg/dL  Magnesium     Status: Abnormal   Collection Time: 12/31/20  4:43 PM  Result Value Ref Range   Magnesium 1.6 (L) 1.7 - 2.4 mg/dL  Phosphorus     Status: Abnormal   Collection Time: 12/31/20  4:43 PM  Result Value Ref Range   Phosphorus 1.6 (L) 2.5 - 4.6 mg/dL  Glucose, capillary     Status: Abnormal   Collection Time: 12/31/20  7:46 PM  Result Value Ref Range   Glucose-Capillary 190 (H) 70 - 99 mg/dL  Glucose, capillary     Status: Abnormal   Collection Time: 12/31/20 11:33 PM  Result Value Ref Range   Glucose-Capillary 191 (H) 70 - 99 mg/dL  Glucose, capillary     Status: Abnormal   Collection Time: 01/01/21  3:42 AM  Result Value Ref Range   Glucose-Capillary 171 (H) 70 - 99 mg/dL  CBC     Status:  Abnormal   Collection Time: 01/01/21  6:26 AM  Result Value Ref Range   WBC 9.1 4.0 - 10.5 K/uL   RBC 3.30 (L) 4.22 - 5.81 MIL/uL   Hemoglobin 8.5 (L) 13.0 - 17.0 g/dL   HCT 26.9 (L) 39.0 - 52.0 %   MCV 81.5 80.0 - 100.0 fL   MCH 25.8 (L) 26.0 - 34.0 pg   MCHC 31.6 30.0 - 36.0 g/dL   RDW 16.0 (H) 11.5 - 15.5 %   Platelets 185 150 - 400 K/uL   nRBC 0.0 0.0 - 0.2 %  Basic metabolic panel     Status: Abnormal   Collection Time: 01/01/21  6:26 AM  Result Value Ref Range   Sodium 140 135 - 145 mmol/L   Potassium 4.1 3.5 - 5.1 mmol/L   Chloride 111 98 - 111 mmol/L   CO2 22 22 - 32 mmol/L   Glucose, Bld 258 (H) 70 - 99 mg/dL   BUN 20 8 - 23 mg/dL   Creatinine, Ser 0.74 0.61 - 1.24 mg/dL   Calcium 8.3 (L) 8.9 - 10.3 mg/dL   GFR, Estimated >60 >60 mL/min   Anion gap 7 5 - 15  Triglycerides     Status: Abnormal   Collection Time:  01/01/21  6:26 AM  Result Value Ref Range   Triglycerides 180 (H) <150 mg/dL  Magnesium     Status: None   Collection Time: 01/01/21  6:26 AM  Result Value Ref Range   Magnesium 1.9 1.7 - 2.4 mg/dL  Phosphorus     Status: Abnormal   Collection Time: 01/01/21  6:26 AM  Result Value Ref Range   Phosphorus 1.8 (L) 2.5 - 4.6 mg/dL  Glucose, capillary     Status: Abnormal   Collection Time: 01/01/21  8:02 AM  Result Value Ref Range   Glucose-Capillary 247 (H) 70 - 99 mg/dL    Assessment & Plan: The plan of care was discussed with the bedside nurse for the day, Jen L, who is in agreement with this plan and no additional concerns were raised.   Present on Admission: **None**    LOS: 4 days   Additional comments:I reviewed the patient's new clinical lab test results.   and I reviewed the patients new imaging test results.    Fall down stairs 8/12  VDRF - significant secretions, add guaifenisen, mental status not such that extubation is possible. CXR and resp cx pending.  TBI/SAH/SDH - NSGY c/s, Dr. Annette Stable, not following commands but arouses to voice. Significant frontal lobe injuries. Keppra x7d for sz ppx Occipital bone fx - NSGY c/s, Dr. Annette Stable Temporal bone fx extending into middle ear - ENT c/s, Dr. Constance Holster Right TM Rupture - ENT c/s, Dr. Constance Holster Hx DM2 - SSI Hx HTN - PRN meds FEN - NPO, d/c MIVF, cortrak/TF, escalate bowel regimen - added senna and MoM x1 VTE - SCDs, no LMWH x1w per NSGY (ordered to start 8/18) ID - hold off on abx until more cx data available  Foley - replace today, increase urecholine Dispo - ICU  Critical Care Total Time: 45 minutes  Jesusita Oka, MD Trauma & General Surgery Please use [email or web address] to contact on call provider  01/01/2021  *Care during the described time interval was provided by me. I have reviewed this patient's available data, including medical history, events of note, physical examination and test results as part of my evaluation.

## 2021-01-01 NOTE — Progress Notes (Addendum)
   Providing Compassionate, Quality Care - Together   Subjective: Nurse reports patient not following commands.  Objective: Vital signs in last 24 hours: Temp:  [98.4 F (36.9 C)-99.9 F (37.7 C)] 98.4 F (36.9 C) (08/16 1112) Pulse Rate:  [56-106] 91 (08/16 1130) Resp:  [13-24] 19 (08/16 1130) BP: (101-158)/(55-81) 134/78 (08/16 1130) SpO2:  [86 %-100 %] 93 % (08/16 1130) FiO2 (%):  [50 %-60 %] 60 % (08/16 1115)  Intake/Output from previous day: 08/15 0701 - 08/16 0700 In: 3519.6 [I.V.:2342; NG/GT:1177.6] Out: 1100 [Urine:1100] Intake/Output this shift: Total I/O In: 691.2 [I.V.:431.2; NG/GT:260] Out: 450 [Urine:450]  Responds to voice PERRLA MAE, unable to follow commands OG in place Intubated    Lab Results: Recent Labs    12/31/20 0133 01/01/21 0626  WBC 10.9* 9.1  HGB 9.4* 8.5*  HCT 29.7* 26.9*  PLT 162 185   BMET Recent Labs    12/31/20 0133 01/01/21 0626  NA 135 140  K 3.9 4.1  CL 108 111  CO2 20* 22  GLUCOSE 129* 258*  BUN 12 20  CREATININE 0.73 0.74  CALCIUM 8.2* 8.3*    Studies/Results: DG Abd Portable 1V  Result Date: 12/31/2020 CLINICAL DATA:  Feeding tube placement. EXAM: PORTABLE ABDOMEN - 1 VIEW COMPARISON:  Same day. FINDINGS: The bowel gas pattern is normal. Distal tip of feeding tube is seen in expected position of distal stomach. No radio-opaque calculi or other significant radiographic abnormality are seen. IMPRESSION: Distal tip of feeding tube seen in expected position of distal stomach. Electronically Signed   By: Marijo Conception M.D.   On: 12/31/2020 11:20   DG Abd Portable 1V  Result Date: 12/31/2020 CLINICAL DATA:  Orogastric tube placement. EXAM: PORTABLE ABDOMEN - 1 VIEW COMPARISON:  None. FINDINGS: Orogastric tube terminates in the proximal stomach. IMPRESSION: Orogastric tube terminates in the proximal stomach. Electronically Signed   By: Lorin Picket M.D.   On: 12/31/2020 10:39    Assessment/Plan: Patient  presented to [facility] ED on 12/28/2020 with SAH and skull fractures following a fall down about 8 steps. He remains intubated and sedated for airway protection. No Neurosurgical intervention recommended. Patient not ready for extubation due to mental status.   LOS: 4 days   -Continue supportive care -Can restart Lovenox on 01/06/2021     Viona Gilmore, DNP, AGNP-C Nurse Practitioner  [facility] [place]. [address] [place], [place], [place] [address] P: [phone]    F: [phone]  01/01/2021, 11:52 AM

## 2021-01-02 ENCOUNTER — Inpatient Hospital Stay (HOSPITAL_COMMUNITY): Payer: PPO

## 2021-01-02 DIAGNOSIS — J9811 Atelectasis: Secondary | ICD-10-CM | POA: Diagnosis not present

## 2021-01-02 DIAGNOSIS — I517 Cardiomegaly: Secondary | ICD-10-CM | POA: Diagnosis not present

## 2021-01-02 DIAGNOSIS — J9690 Respiratory failure, unspecified, unspecified whether with hypoxia or hypercapnia: Secondary | ICD-10-CM | POA: Diagnosis not present

## 2021-01-02 LAB — BASIC METABOLIC PANEL
Anion gap: 8 (ref 5–15)
BUN: 24 mg/dL — ABNORMAL HIGH (ref 8–23)
CO2: 23 mmol/L (ref 22–32)
Calcium: 8.9 mg/dL (ref 8.9–10.3)
Chloride: 112 mmol/L — ABNORMAL HIGH (ref 98–111)
Creatinine, Ser: 0.73 mg/dL (ref 0.61–1.24)
GFR, Estimated: >60 mL/min (ref >60)
Glucose, Bld: 268 mg/dL — ABNORMAL HIGH (ref 70–99)
Potassium: 4.1 mmol/L (ref 3.5–5.1)
Sodium: 143 mmol/L (ref 135–145)

## 2021-01-02 LAB — CBC
HCT: 28 % — ABNORMAL LOW (ref 39.0–52.0)
Hemoglobin: 8.7 g/dL — ABNORMAL LOW (ref 13.0–17.0)
MCH: 25.7 pg — ABNORMAL LOW (ref 26.0–34.0)
MCHC: 31.1 g/dL (ref 30.0–36.0)
MCV: 82.6 fL (ref 80.0–100.0)
Platelets: 239 10*3/uL (ref 150–400)
RBC: 3.39 MIL/uL — ABNORMAL LOW (ref 4.22–5.81)
RDW: 16.4 % — ABNORMAL HIGH (ref 11.5–15.5)
WBC: 8.5 10*3/uL (ref 4.0–10.5)
nRBC: 0 % (ref 0.0–0.2)

## 2021-01-02 LAB — GLUCOSE, CAPILLARY
Glucose-Capillary: 219 mg/dL — ABNORMAL HIGH (ref 70–99)
Glucose-Capillary: 277 mg/dL — ABNORMAL HIGH (ref 70–99)
Glucose-Capillary: 282 mg/dL — ABNORMAL HIGH (ref 70–99)
Glucose-Capillary: 304 mg/dL — ABNORMAL HIGH (ref 70–99)
Glucose-Capillary: 330 mg/dL — ABNORMAL HIGH (ref 70–99)
Glucose-Capillary: 336 mg/dL — ABNORMAL HIGH (ref 70–99)

## 2021-01-02 LAB — PHOSPHORUS: Phosphorus: 2.5 mg/dL (ref 2.5–4.6)

## 2021-01-02 LAB — MAGNESIUM: Magnesium: 2.2 mg/dL (ref 1.7–2.4)

## 2021-01-02 MED ORDER — VANCOMYCIN HCL 2000 MG/400ML IV SOLN
2000.0000 mg | Freq: Once | INTRAVENOUS | Status: AC
  Administered 2021-01-02: 2000 mg via INTRAVENOUS
  Filled 2021-01-02: qty 400

## 2021-01-02 MED ORDER — VANCOMYCIN HCL 1250 MG/250ML IV SOLN
1250.0000 mg | Freq: Two times a day (BID) | INTRAVENOUS | Status: DC
  Administered 2021-01-03: 1250 mg via INTRAVENOUS
  Filled 2021-01-02: qty 250

## 2021-01-02 MED ORDER — INSULIN GLARGINE-YFGN 100 UNIT/ML ~~LOC~~ SOLN
10.0000 [IU] | Freq: Two times a day (BID) | SUBCUTANEOUS | Status: DC
  Administered 2021-01-02 (×2): 10 [IU] via SUBCUTANEOUS
  Filled 2021-01-02 (×4): qty 0.1

## 2021-01-02 MED ORDER — FLEET ENEMA 7-19 GM/118ML RE ENEM
1.0000 | ENEMA | Freq: Every day | RECTAL | Status: DC | PRN
  Administered 2021-01-02: 1 via RECTAL
  Filled 2021-01-02: qty 1

## 2021-01-02 MED ORDER — MAGNESIUM HYDROXIDE 400 MG/5ML PO SUSP
30.0000 mL | Freq: Once | ORAL | Status: AC
  Administered 2021-01-02: 30 mL
  Filled 2021-01-02: qty 30

## 2021-01-02 MED ORDER — FUROSEMIDE 10 MG/ML IJ SOLN
40.0000 mg | Freq: Once | INTRAMUSCULAR | Status: AC
  Administered 2021-01-02: 40 mg via INTRAVENOUS
  Filled 2021-01-02: qty 4

## 2021-01-02 MED ORDER — SODIUM CHLORIDE 0.9 % IV SOLN
2.0000 g | Freq: Three times a day (TID) | INTRAVENOUS | Status: DC
  Administered 2021-01-02 – 2021-01-09 (×22): 2 g via INTRAVENOUS
  Filled 2021-01-02 (×23): qty 2

## 2021-01-02 NOTE — Progress Notes (Signed)
Pharmacy Antibiotic Note  Angel [name] is a 70 y.o. male admitted on 12/28/2020 with sepsis/PNA.  Pharmacy has been consulted for vancomycin dosing. Patient is also on cefepime.   Fevered to 102.7. WBC wnl. Just started cefepime today. Cultures growing strep pneumo and pseudomonas, MIC pending, MRSA neg.   8/17 Vancomycin 1250mg  Q 12 hr Scr used: 0.8 mg/dL Weight: 133.8 kg Vd coeff: 0.5 L/kg Est AUC: 428 (goal 400-600)  Plan: Vancomycin 2000mg  x1 then 1250 mg Q12 hr  Continue cefepime per team Monitor cultures, clinical status, renal fx, vanc level  Narrow abx as able and f/u duration     Height: 6\' 2"  (188 cm) Weight: 133.8 kg (295 lb) IBW/kg (Calculated) : 82.2  Temp (24hrs), Avg:99.6 F (37.6 C), Min:98 F (36.7 C), Max:102.7 F (39.3 C)  Recent Labs  Lab 12/28/20 1607 12/28/20 1616 12/29/20 0638 12/30/20 0214 12/31/20 0133 01/01/21 0626 01/02/21 0452  WBC 7.6  --  7.2 9.6 10.9* 9.1 8.5  CREATININE 0.91   < > 0.90 0.87 0.73 0.74 0.73  LATICACIDVEN 4.0*  --   --   --   --   --   --    < > = values in this interval not displayed.    Estimated Creatinine Clearance: 124.9 mL/min (by C-G formula based on SCr of 0.73 mg/dL).    No Known Allergies  Antimicrobials this admission: cefepime 8/17 >>  vanc 8/17 >>   Dose adjustments this admission: none  Microbiology results: 8/15 Sputum: few strep pneumo, few pseudomonas  8/12 MRSA PCR: neg  Thank you for allowing pharmacy to be a part of this patient's care.   Benetta Spar, PharmD, BCPS, BCCP Clinical Pharmacist  Please check AMION for all [place] phone numbers After 10:00 PM, call [place] [phone]

## 2021-01-02 NOTE — Progress Notes (Signed)
Patient became febrile- 102.7 axillary. ULE BP 91/42, retook immediately 114/49 Attempted BP on both lowers- LLE 201/78; RLE 191/78. Subjectively, patient also appears more ill than previously in the day. He is still arousable with good UOP and has produced stool today.  Contacted Dr. Donne Hazel who gave VO for vancomycin per pharmacy consult, also to continue pulmonary toileting efforts to clear lungs.

## 2021-01-02 NOTE — Progress Notes (Addendum)
   Providing Compassionate, Quality Care - Together   Subjective: Nurse reports no issues overnight.  Objective: Vital signs in last 24 hours: Temp:  [98 F (36.7 C)-100 F (37.8 C)] 98 F (36.7 C) (08/17 1200) Pulse Rate:  [63-95] 82 (08/17 1200) Resp:  [16-23] 20 (08/17 1200) BP: (116-165)/(50-87) 128/50 (08/17 1200) SpO2:  [87 %-96 %] 95 % (08/17 1200) FiO2 (%):  [60 %] 60 % (08/17 1200)  Intake/Output from previous day: 08/16 0701 - 08/17 0700 In: 2293 [I.V.:733; NG/GT:1560] Out: 1955 [Urine:1955] Intake/Output this shift: Total I/O In: 480.8 [I.V.:55.8; NG/GT:325; IV Piggyback:100] Out: 1915 [Urine:1915]  Responds to voice PERRLA MAE, unable to follow commands OG in place Intubated  Lab Results: Recent Labs    01/01/21 0626 01/02/21 0452  WBC 9.1 8.5  HGB 8.5* 8.7*  HCT 26.9* 28.0*  PLT 185 239   BMET Recent Labs    01/01/21 0626 01/02/21 0452  NA 140 143  K 4.1 4.1  CL 111 112*  CO2 22 23  GLUCOSE 258* 268*  BUN 20 24*  CREATININE 0.74 0.73  CALCIUM 8.3* 8.9    Studies/Results: DG Chest Port 1 View  Result Date: 01/02/2021 CLINICAL DATA:  Respiratory failure. EXAM: PORTABLE CHEST 1 VIEW COMPARISON:  01/01/2021 FINDINGS: The endotracheal tube and feeding tube are in good position, unchanged. Stable mild cardiac enlargement and prominent mediastinal contours. Improved lung aeration with resolving edema and atelectasis. No pleural effusions or pneumothorax. IMPRESSION: Improved lung aeration with resolving edema and atelectasis. Stable support apparatus. Electronically Signed   By: Marijo Sanes M.D.   On: 01/02/2021 08:03   DG Chest Port 1 View  Result Date: 01/01/2021 CLINICAL DATA:  70 year old male with endotracheal tube and fall EXAM: PORTABLE CHEST 1 VIEW COMPARISON:  12/28/2020 FINDINGS: Endotracheal tube unchanged, terminating 3.3 cm above the carina. Interval removal of the gastric tube in placement of enteric feeding tube which terminates  out of the field of view. Low lung volumes persist with new airspace opacity on the right. No pneumothorax. No large pleural effusion. IMPRESSION: Low lung volumes, with new airspace opacity on the right, potentially atelectasis or new consolidation. Unchanged endotracheal tube. Interval removal of gastric tube and placement of enteric feeding tube Electronically Signed   By: Corrie Mckusick D.O.   On: 01/01/2021 12:43    Assessment/Plan: Patient presented to the [facility] ED on 12/28/2020 with SAH and skull fractures following a fall down about 8 steps. He remains intubated and sedated for airway protection. No Neurosurgical intervention recommended. Patient not ready for extubation due to mental status and increased ventilator requirements secondary to pneumonia.    LOS: 5 days   -Continue supportive care -Can restart Lovenox on 01/06/2021 -Family was updated at the bedside   Viona Gilmore, [place], AGNP-C Nurse Practitioner  [facility] [place]. [address], [address], [place], [place] [address] P: [phone]    F: [phone]  01/02/2021, 12:54 PM

## 2021-01-02 NOTE — Progress Notes (Signed)
Patient ID: Angel [name], male   DOB: Jul 09, 1950, 70 y.o.   MRN: [id number] Follow up - Trauma Critical Care  Patient Details:    Angel [name] is an 70 y.o. male.  Lines/tubes : Airway 8 mm (Active)  Secured at (cm) 25 cm 01/02/21 0256  Measured From Lips 01/02/21 0256  Secured Location Right 01/02/21 0256  Secured By [employer] 01/02/21 0256  Tube Holder Repositioned Yes 01/02/21 0256  Prone position No 01/02/21 0256  Cuff Pressure (cm H2O) Clear OR 27-39 CmH2O 01/01/21 1945  Site Condition Dry 01/02/21 0256     Urethral Catheter Leonides Sake RN Double-lumen 16 Fr. (Active)  Indication for Insertion or Continuance of Catheter Acute urinary retention (I&O Cath for 24 hrs prior to catheter insertion- Inpatient Only) 01/02/21 0716  Site Assessment Clean;Intact 01/02/21 0716  Catheter Maintenance Bag below level of bladder;Catheter secured;Drainage bag/tubing not touching floor;Insertion date on drainage bag;No dependent loops;Seal intact 01/02/21 0716  Collection Container Standard drainage bag 01/02/21 0716  Securement Method Securing device (Describe) 01/02/21 0716  Urinary Catheter Interventions (if applicable) Unclamped 97/67/34 0716  Output (mL) 115 mL 01/02/21 0752    Microbiology/Sepsis markers: Results for orders placed or performed during the hospital encounter of 12/28/20  Resp Panel by RT-PCR (Flu A&B, Covid) Nasopharyngeal Swab     Status: None   Collection Time: 12/28/20  4:17 PM   Specimen: Nasopharyngeal Swab; Nasopharyngeal(NP) swabs in vial transport medium  Result Value Ref Range Status   SARS Coronavirus 2 by RT PCR NEGATIVE NEGATIVE Final    Comment: (NOTE) SARS-CoV-2 target nucleic acids are NOT DETECTED.  The SARS-CoV-2 RNA is generally detectable in upper respiratory specimens during the acute phase of infection. The lowest concentration of SARS-CoV-2 viral copies this assay can detect is 138 copies/mL. A negative result does not preclude  SARS-Cov-2 infection and should not be used as the sole basis for treatment or other patient management decisions. A negative result may occur with  improper specimen collection/handling, submission of specimen other than nasopharyngeal swab, presence of viral mutation(s) within the areas targeted by this assay, and inadequate number of viral copies(<138 copies/mL). A negative result must be combined with clinical observations, patient history, and epidemiological information. The expected result is Negative.  Fact Sheet for Patients:  EntrepreneurPulse.com.au  Fact Sheet for Healthcare Providers:  IncredibleEmployment.be  This test is no t yet approved or cleared by the [place] FDA and  has been authorized for detection and/or diagnosis of SARS-CoV-2 by FDA under an Emergency Use Authorization (EUA). This EUA will remain  in effect (meaning this test can be used) for the duration of the COVID-19 declaration under Section 564(b)(1) of the Act, 21 U.S.C.section 360bbb-3(b)(1), unless the authorization is terminated  or revoked sooner.       Influenza A by PCR NEGATIVE NEGATIVE Final   Influenza B by PCR NEGATIVE NEGATIVE Final    Comment: (NOTE) The Xpert Xpress SARS-CoV-2/FLU/RSV plus assay is intended as an aid in the diagnosis of influenza from Nasopharyngeal swab specimens and should not be used as a sole basis for treatment. Nasal washings and aspirates are unacceptable for Xpert Xpress SARS-CoV-2/FLU/RSV testing.  Fact Sheet for Patients: EntrepreneurPulse.com.au  Fact Sheet for Healthcare Providers: IncredibleEmployment.be  This test is not yet approved or cleared by the [place] FDA and has been authorized for detection and/or diagnosis of SARS-CoV-2 by FDA under an Emergency Use Authorization (EUA). This EUA will remain in effect (meaning this test can  be used) for the duration of  the COVID-19 declaration under Section 564(b)(1) of the Act, 21 U.S.C. section 360bbb-3(b)(1), unless the authorization is terminated or revoked.  Performed at [facility], [place] [address], [place], [place] [address]   MRSA Next Gen by PCR, Nasal     Status: None   Collection Time: 12/28/20  7:32 PM   Specimen: Nasal Mucosa; Nasal Swab  Result Value Ref Range Status   MRSA by PCR Next Gen NOT DETECTED NOT DETECTED Final    Comment: (NOTE) The GeneXpert MRSA Assay (FDA approved for NASAL specimens only), is one component of a comprehensive MRSA colonization surveillance program. It is not intended to diagnose MRSA infection nor to guide or monitor treatment for MRSA infections. Test performance is not FDA approved in patients less than 52 years old. Performed at [facility], [place] [address], [place], [place] [address]   Culture, Respiratory w Gram Stain     Status: None (Preliminary result)   Collection Time: 12/31/20 11:06 AM   Specimen: Tracheal Aspirate; Respiratory  Result Value Ref Range Status   Specimen Description TRACHEAL ASPIRATE  Final   Special Requests NONE  Final   Gram Stain   Final    FEW SQUAMOUS EPITHELIAL CELLS PRESENT FEW WBC PRESENT,BOTH PMN AND MONONUCLEAR FEW GRAM POSITIVE COCCI    Culture   Final    TOO YOUNG TO READ Performed at [facility], [place] [address] [place], [place] [address]    Report Status PENDING  Incomplete    Anti-infectives:  Anti-infectives (From admission, onward)    Start     Dose/Rate Route Frequency Ordered Stop   01/02/21 1000  ceFEPIme (MAXIPIME) 2 g in sodium chloride 0.9 % 100 mL IVPB        2 g 200 mL/hr over 30 Minutes Intravenous Every 12 hours 01/02/21 0849       Consults: Treatment Team:  Angel Larsson, MD Angel Gala, MD    Studies:    Events:  Subjective:    Overnight Issues:   Objective:  Vital signs for last 24 hours: Temp:  [98.4 F (36.9 C)-99.2 F (37.3 C)] 99 F (37.2  C) (08/17 0400) Pulse Rate:  [67-95] 69 (08/17 0800) Resp:  [13-23] 20 (08/17 0800) BP: (112-165)/(59-87) 129/59 (08/17 0736) SpO2:  [86 %-95 %] 92 % (08/17 0800) FiO2 (%):  [60 %] 60 % (08/17 0800)  Hemodynamic parameters for last 24 hours:    Intake/Output from previous day: 08/16 0701 - 08/17 0700 In: 2293 [I.V.:733; NG/GT:1560] Out: 1955 [Urine:1955]  Intake/Output this shift: Total I/O In: 76.6 [I.V.:11.6; NG/GT:65] Out: 115 [Urine:115]  Vent settings for last 24 hours: Vent Mode: PRVC FiO2 (%):  [60 %] 60 % Set Rate:  [20 bmp] 20 bmp Vt Set:  [650 mL] 650 mL PEEP:  [10 cmH20] 10 cmH20 Plateau Pressure:  [18 cmH20-31 cmH20] 27 cmH20  Physical Exam:  General: on vent Neuro: on vent, not F/C HEENT/Neck: ETT Resp: CTA CVS: RRR GI: quite distended with tympani, active BS, NT Extremities: some edema  Results for orders placed or performed during the hospital encounter of 12/28/20 (from the past 24 hour(s))  Glucose, capillary     Status: Abnormal   Collection Time: 01/01/21 11:11 AM  Result Value Ref Range   Glucose-Capillary 226 (H) 70 - 99 mg/dL  Glucose, capillary     Status: Abnormal   Collection Time: 01/01/21  3:38 PM  Result Value Ref Range  Glucose-Capillary 232 (H) 70 - 99 mg/dL  Glucose, capillary     Status: Abnormal   Collection Time: 01/01/21  7:46 PM  Result Value Ref Range   Glucose-Capillary 233 (H) 70 - 99 mg/dL  Glucose, capillary     Status: Abnormal   Collection Time: 01/01/21 11:27 PM  Result Value Ref Range   Glucose-Capillary 205 (H) 70 - 99 mg/dL  Glucose, capillary     Status: Abnormal   Collection Time: 01/02/21  3:51 AM  Result Value Ref Range   Glucose-Capillary 219 (H) 70 - 99 mg/dL  CBC     Status: Abnormal   Collection Time: 01/02/21  4:52 AM  Result Value Ref Range   WBC 8.5 4.0 - 10.5 K/uL   RBC 3.39 (L) 4.22 - 5.81 MIL/uL   Hemoglobin 8.7 (L) 13.0 - 17.0 g/dL   HCT 28.0 (L) 39.0 - 52.0 %   MCV 82.6 80.0 - 100.0 fL    MCH 25.7 (L) 26.0 - 34.0 pg   MCHC 31.1 30.0 - 36.0 g/dL   RDW 16.4 (H) 11.5 - 15.5 %   Platelets 239 150 - 400 K/uL   nRBC 0.0 0.0 - 0.2 %  Basic metabolic panel     Status: Abnormal   Collection Time: 01/02/21  4:52 AM  Result Value Ref Range   Sodium 143 135 - 145 mmol/L   Potassium 4.1 3.5 - 5.1 mmol/L   Chloride 112 (H) 98 - 111 mmol/L   CO2 23 22 - 32 mmol/L   Glucose, Bld 268 (H) 70 - 99 mg/dL   BUN 24 (H) 8 - 23 mg/dL   Creatinine, Ser 0.73 0.61 - 1.24 mg/dL   Calcium 8.9 8.9 - 10.3 mg/dL   GFR, Estimated >60 >60 mL/min   Anion gap 8 5 - 15  Magnesium     Status: None   Collection Time: 01/02/21  4:52 AM  Result Value Ref Range   Magnesium 2.2 1.7 - 2.4 mg/dL  Phosphorus     Status: None   Collection Time: 01/02/21  4:52 AM  Result Value Ref Range   Phosphorus 2.5 2.5 - 4.6 mg/dL  Glucose, capillary     Status: Abnormal   Collection Time: 01/02/21  8:32 AM  Result Value Ref Range   Glucose-Capillary 282 (H) 70 - 99 mg/dL    Assessment & Plan: Present on Admission: **None**    LOS: 5 days   Additional comments:I reviewed the patient's new clinical lab test results. And CXR Fall down stairs 8/12  VDRF - significant secretions, add guaifenisen, mental status not such that extubation is possible. CXR and resp cx pending.  ID - a lot of secretions, resp CX pending, start Maxipime empiric TBI/SAH/SDH - NSGY c/s, Dr. Annette Stable, not following commands but arouses to voice. Significant frontal lobe injuries. Keppra x7d for sz ppx Occipital bone fx - NSGY c/s, Dr. Annette Stable Temporal bone fx extending into middle ear - ENT c/s, Dr. Constance Holster Right TM Rupture - ENT c/s, Dr. Constance Holster Hx DM2 - SSI, add glargine 10u BID Hx HTN - PRN meds FEN - NPO, lasix x 1, cortrak/TF, MOM again, fleets PRN VTE - SCDs, no LMWH x1w per NSGY (ordered to start 8/18) ID - hold off on abx until more cx data available  Foley - replaced 8/16, on urecholine Dispo - ICU  Critical Care Total Time*: 45  Minutes  Georganna Skeans, MD, MPH, FACS Trauma & General Surgery Use [email or web address] to contact on call  provider  01/02/2021  *Care during the described time interval was provided by me. I have reviewed this patient's available data, including medical history, events of note, physical examination and test results as part of my evaluation.

## 2021-01-03 ENCOUNTER — Other Ambulatory Visit: Payer: Self-pay

## 2021-01-03 LAB — POCT I-STAT 7, (LYTES, BLD GAS, ICA,H+H)
Acid-Base Excess: 0 mmol/L (ref 0.0–2.0)
Bicarbonate: 24.8 mmol/L (ref 20.0–28.0)
Calcium, Ion: 1.36 mmol/L (ref 1.15–1.40)
HCT: 25 % — ABNORMAL LOW (ref 39.0–52.0)
Hemoglobin: 8.5 g/dL — ABNORMAL LOW (ref 13.0–17.0)
O2 Saturation: 95 %
Patient temperature: 99.8
Potassium: 3.8 mmol/L (ref 3.5–5.1)
Sodium: 151 mmol/L — ABNORMAL HIGH (ref 135–145)
TCO2: 26 mmol/L (ref 22–32)
pCO2 arterial: 41.6 mmHg (ref 32.0–48.0)
pH, Arterial: 7.387 (ref 7.350–7.450)
pO2, Arterial: 79 mmHg — ABNORMAL LOW (ref 83.0–108.0)

## 2021-01-03 LAB — MAGNESIUM: Magnesium: 2.3 mg/dL (ref 1.7–2.4)

## 2021-01-03 LAB — BASIC METABOLIC PANEL
Anion gap: 8 (ref 5–15)
BUN: 35 mg/dL — ABNORMAL HIGH (ref 8–23)
CO2: 24 mmol/L (ref 22–32)
Calcium: 9.1 mg/dL (ref 8.9–10.3)
Chloride: 114 mmol/L — ABNORMAL HIGH (ref 98–111)
Creatinine, Ser: 0.87 mg/dL (ref 0.61–1.24)
GFR, Estimated: >60 mL/min (ref >60)
Glucose, Bld: 325 mg/dL — ABNORMAL HIGH (ref 70–99)
Potassium: 4 mmol/L (ref 3.5–5.1)
Sodium: 146 mmol/L — ABNORMAL HIGH (ref 135–145)

## 2021-01-03 LAB — PHOSPHORUS: Phosphorus: 1.6 mg/dL — ABNORMAL LOW (ref 2.5–4.6)

## 2021-01-03 LAB — CULTURE, RESPIRATORY W GRAM STAIN

## 2021-01-03 LAB — CBC
HCT: 29.4 % — ABNORMAL LOW (ref 39.0–52.0)
Hemoglobin: 8.8 g/dL — ABNORMAL LOW (ref 13.0–17.0)
MCH: 25.3 pg — ABNORMAL LOW (ref 26.0–34.0)
MCHC: 29.9 g/dL — ABNORMAL LOW (ref 30.0–36.0)
MCV: 84.5 fL (ref 80.0–100.0)
Platelets: 233 10*3/uL (ref 150–400)
RBC: 3.48 MIL/uL — ABNORMAL LOW (ref 4.22–5.81)
RDW: 16.8 % — ABNORMAL HIGH (ref 11.5–15.5)
WBC: 7.6 10*3/uL (ref 4.0–10.5)
nRBC: 0 % (ref 0.0–0.2)

## 2021-01-03 LAB — GLUCOSE, CAPILLARY
Glucose-Capillary: 315 mg/dL — ABNORMAL HIGH (ref 70–99)
Glucose-Capillary: 265 mg/dL — ABNORMAL HIGH (ref 70–99)
Glucose-Capillary: 278 mg/dL — ABNORMAL HIGH (ref 70–99)
Glucose-Capillary: 281 mg/dL — ABNORMAL HIGH (ref 70–99)
Glucose-Capillary: 276 mg/dL — ABNORMAL HIGH (ref 70–99)

## 2021-01-03 MED ORDER — CLONAZEPAM 0.25 MG PO TBDP
0.2500 mg | ORAL_TABLET | Freq: Two times a day (BID) | ORAL | Status: DC
  Administered 2021-01-03 – 2021-01-11 (×16): 0.25 mg
  Filled 2021-01-03 (×16): qty 1

## 2021-01-03 MED ORDER — POTASSIUM & SODIUM PHOSPHATES 280-160-250 MG PO PACK
1.0000 | PACK | Freq: Three times a day (TID) | ORAL | Status: AC
  Administered 2021-01-03 (×3): 1
  Filled 2021-01-03 (×2): qty 1

## 2021-01-03 MED ORDER — FREE WATER
200.0000 mL | Freq: Three times a day (TID) | Status: DC
  Administered 2021-01-03 – 2021-01-15 (×29): 200 mL

## 2021-01-03 MED ORDER — INSULIN GLARGINE-YFGN 100 UNIT/ML ~~LOC~~ SOLN
20.0000 [IU] | Freq: Two times a day (BID) | SUBCUTANEOUS | Status: DC
  Administered 2021-01-03 – 2021-01-06 (×8): 20 [IU] via SUBCUTANEOUS
  Filled 2021-01-03 (×9): qty 0.2

## 2021-01-03 MED ORDER — LABETALOL HCL 5 MG/ML IV SOLN
INTRAVENOUS | Status: AC
  Administered 2021-01-03: 20 mg via INTRAVENOUS
  Filled 2021-01-03: qty 4

## 2021-01-03 MED ORDER — FUROSEMIDE 10 MG/ML IJ SOLN
40.0000 mg | Freq: Once | INTRAMUSCULAR | Status: AC
  Administered 2021-01-03: 40 mg via INTRAVENOUS
  Filled 2021-01-03: qty 4

## 2021-01-03 MED ORDER — LABETALOL HCL 5 MG/ML IV SOLN
10.0000 mg | INTRAVENOUS | Status: DC | PRN
  Administered 2021-01-03 – 2021-01-04 (×3): 20 mg via INTRAVENOUS
  Administered 2021-01-06: 10 mg via INTRAVENOUS
  Administered 2021-01-06 – 2021-01-08 (×2): 20 mg via INTRAVENOUS
  Filled 2021-01-03 (×6): qty 4

## 2021-01-03 MED ORDER — POTASSIUM & SODIUM PHOSPHATES 280-160-250 MG PO PACK
1.0000 | PACK | Freq: Three times a day (TID) | ORAL | Status: DC
  Filled 2021-01-03: qty 1

## 2021-01-03 NOTE — Progress Notes (Signed)
Inpatient Diabetes Program Recommendations  AACE/ADA: New Consensus Statement on Inpatient Glycemic Control  Target Ranges:  Prepandial:   less than 140 mg/dL      Peak postprandial:   less than 180 mg/dL (1-2 hours)      Critically ill patients:  140 - 180 mg/dL  Results for RIDDIK, SENNA (MRN [id number]) as of 01/03/2021 11:48  Ref. Range 01/02/2021 08:32 01/02/2021 12:05 01/02/2021 16:11 01/02/2021 19:53 01/02/2021 23:38 01/03/2021 03:36 01/03/2021 07:15  Glucose-Capillary Latest Ref Range: 70 - 99 mg/dL 282 (H) 330 (H) 336 (H) 277 (H) 304 (H) 315 (H) 265 (H)    Review of Glycemic Control  Diabetes history: DM2 Outpatient Diabetes medications: Glipizide 2.5 mg daily, Tradjenta 5 mg daily, Metformin 1000 mg BID, Actos 45 mg QHS Current orders for Inpatient glycemic control: Semglee 20 units BID, Novolog 0-15 units Q4H, Pivot @ 65 ml/hr  Inpatient Diabetes Program Recommendations:    Insulin: Noted Semglee increased from 10 to 20 units BID. Please consider ordering Novolog 6 units Q4H for tube feeding coverage. If tube feeding is stopped or held then Novolog tube feeding coverage should also be stopped or held.  Thanks, Barnie Alderman, RN, MSN, CDE Diabetes Coordinator Inpatient Diabetes Program [phone] (Team Pager from 8am to 5pm)

## 2021-01-03 NOTE — Progress Notes (Signed)
Patient ID: GERBER PENZA, male   DOB: January 15, 1951, 70 y.o.   MRN: [id number] Follow up - Trauma Critical Care  Patient Details:    IRWIN TORAN is an 70 y.o. male.  Lines/tubes : Airway 8 mm (Active)  Secured at (cm) 25 cm 01/03/21 0307  Measured From Lips 01/03/21 Vivian 01/03/21 0307  Secured By [employer] 01/03/21 0307  Tube Holder Repositioned Yes 01/03/21 0307  Prone position No 01/03/21 0307  Cuff Pressure (cm H2O) Clear OR 27-39 [facility] 01/02/21 0736  Site Condition Dry 01/03/21 0307     Urethral Catheter Leonides Sake RN Double-lumen 16 Fr. (Active)  Indication for Insertion or Continuance of Catheter Acute urinary retention (I&O Cath for 24 hrs prior to catheter insertion- Inpatient Only) 01/03/21 0710  Site Assessment Clean;Intact 01/03/21 0710  Catheter Maintenance Bag below level of bladder;Catheter secured;Drainage bag/tubing not touching floor;Seal intact;No dependent loops;Insertion date on drainage bag 01/03/21 0710  Collection Container Standard drainage bag 01/03/21 0710  Securement Method Securing device (Describe) 01/03/21 0710  Urinary Catheter Interventions (if applicable) Unclamped 54/62/70 2000  Output (mL) 175 mL 01/03/21 0600    Microbiology/Sepsis markers: Results for orders placed or performed during the hospital encounter of 12/28/20  Resp Panel by RT-PCR (Flu A&B, Covid) Nasopharyngeal Swab     Status: None   Collection Time: 12/28/20  4:17 PM   Specimen: Nasopharyngeal Swab; Nasopharyngeal(NP) swabs in vial transport medium  Result Value Ref Range Status   SARS Coronavirus 2 by RT PCR NEGATIVE NEGATIVE Final    Comment: (NOTE) SARS-CoV-2 target nucleic acids are NOT DETECTED.  The SARS-CoV-2 RNA is generally detectable in upper respiratory specimens during the acute phase of infection. The lowest concentration of SARS-CoV-2 viral copies this assay can detect is 138 copies/mL. A negative result does not preclude  SARS-Cov-2 infection and should not be used as the sole basis for treatment or other patient management decisions. A negative result may occur with  improper specimen collection/handling, submission of specimen other than nasopharyngeal swab, presence of viral mutation(s) within the areas targeted by this assay, and inadequate number of viral copies(<138 copies/mL). A negative result must be combined with clinical observations, patient history, and epidemiological information. The expected result is Negative.  Fact Sheet for Patients:  EntrepreneurPulse.com.au  Fact Sheet for Healthcare Providers:  IncredibleEmployment.be  This test is no t yet approved or cleared by the [place] FDA and  has been authorized for detection and/or diagnosis of SARS-CoV-2 by FDA under an Emergency Use Authorization (EUA). This EUA will remain  in effect (meaning this test can be used) for the duration of the COVID-19 declaration under Section 564(b)(1) of the Act, 21 U.S.C.section 360bbb-3(b)(1), unless the authorization is terminated  or revoked sooner.       Influenza A by PCR NEGATIVE NEGATIVE Final   Influenza B by PCR NEGATIVE NEGATIVE Final    Comment: (NOTE) The Xpert Xpress SARS-CoV-2/FLU/RSV plus assay is intended as an aid in the diagnosis of influenza from Nasopharyngeal swab specimens and should not be used as a sole basis for treatment. Nasal washings and aspirates are unacceptable for Xpert Xpress SARS-CoV-2/FLU/RSV testing.  Fact Sheet for Patients: EntrepreneurPulse.com.au  Fact Sheet for Healthcare Providers: IncredibleEmployment.be  This test is not yet approved or cleared by the [place] FDA and has been authorized for detection and/or diagnosis of SARS-CoV-2 by FDA under an Emergency Use Authorization (EUA). This EUA will remain in effect (meaning this test can  be used) for the duration of  the COVID-19 declaration under Section 564(b)(1) of the Act, 21 U.S.C. section 360bbb-3(b)(1), unless the authorization is terminated or revoked.  Performed at [facility], [place] [address], [place], [place] [address]   MRSA Next Gen by PCR, Nasal     Status: None   Collection Time: 12/28/20  7:32 PM   Specimen: Nasal Mucosa; Nasal Swab  Result Value Ref Range Status   MRSA by PCR Next Gen NOT DETECTED NOT DETECTED Final    Comment: (NOTE) The GeneXpert MRSA Assay (FDA approved for NASAL specimens only), is one component of a comprehensive MRSA colonization surveillance program. It is not intended to diagnose MRSA infection nor to guide or monitor treatment for MRSA infections. Test performance is not FDA approved in patients less than 65 years old. Performed at [facility], [place] [address], [place], [place] [address]   Culture, Respiratory w Gram Stain     Status: None (Preliminary result)   Collection Time: 12/31/20 11:06 AM   Specimen: Tracheal Aspirate; Respiratory  Result Value Ref Range Status   Specimen Description TRACHEAL ASPIRATE  Final   Special Requests NONE  Final   Gram Stain   Final    FEW SQUAMOUS EPITHELIAL CELLS PRESENT FEW WBC PRESENT,BOTH PMN AND MONONUCLEAR FEW GRAM POSITIVE COCCI    Culture   Final    FEW PSEUDOMONAS AERUGINOSA FEW STREPTOCOCCUS PNEUMONIAE SUSCEPTIBILITIES TO FOLLOW Performed at [facility], [place] [address] [place], [place] [address]    Report Status PENDING  Incomplete    Anti-infectives:  Anti-infectives (From admission, onward)    Start     Dose/Rate Route Frequency Ordered Stop   01/03/21 0600  vancomycin (VANCOREADY) IVPB 1250 mg/250 mL        1,250 mg 166.7 mL/hr over 90 Minutes Intravenous Every 12 hours 01/02/21 1717     01/02/21 1800  vancomycin (VANCOREADY) IVPB 2000 mg/400 mL        2,000 mg 200 mL/hr over 120 Minutes Intravenous  Once 01/02/21 1712 01/02/21 2007   01/02/21 0900  ceFEPIme  (MAXIPIME) 2 g in sodium chloride 0.9 % 100 mL IVPB        2 g 200 mL/hr over 30 Minutes Intravenous Every 8 hours 01/02/21 0849         Best Practice/Protocols:  VTE Prophylaxis: Lovenox (prophylaxtic dose) Intermittent Sedation  Consults: Treatment Team:  Earnie Larsson, MD Izora Gala, MD    Studies:    Events:  Subjective:    Overnight Issues:   Objective:  Vital signs for last 24 hours: Temp:  [98 F (36.7 C)-102.7 F (39.3 C)] 98.8 F (37.1 C) (08/18 0722) Pulse Rate:  [61-88] 63 (08/18 0600) Resp:  [8-24] 20 (08/18 0700) BP: (91-201)/(36-86) 140/70 (08/18 0700) SpO2:  [89 %-98 %] 91 % (08/18 0600) FiO2 (%):  [50 %-60 %] 50 % (08/18 0307)  Hemodynamic parameters for last 24 hours:    Intake/Output from previous day: 08/17 0701 - 08/18 0700 In: 2669.2 [I.V.:396.4; DT/OI:7124; IV Piggyback:777.9] Out: 3890 [Urine:3890]  Intake/Output this shift: No intake/output data recorded.  Vent settings for last 24 hours: Vent Mode: PRVC FiO2 (%):  [50 %-60 %] 50 % Set Rate:  [20 bmp] 20 bmp Vt Set:  [650 mL] 650 mL PEEP:  [10 cmH20] 10 cmH20 Plateau Pressure:  [24 PYK99-83 cmH20] 27 cmH20  Physical Exam:  General: on vent Neuro: opens eyes and ? F/C HEENT/Neck: ETT Resp: few  rhonchi CVS: IRR GI: distended but softer Extremities: edema 1+  Results for orders placed or performed during the hospital encounter of 12/28/20 (from the past 24 hour(s))  Glucose, capillary     Status: Abnormal   Collection Time: 01/02/21  8:32 AM  Result Value Ref Range   Glucose-Capillary 282 (H) 70 - 99 mg/dL  Glucose, capillary     Status: Abnormal   Collection Time: 01/02/21 12:05 PM  Result Value Ref Range   Glucose-Capillary 330 (H) 70 - 99 mg/dL  Glucose, capillary     Status: Abnormal   Collection Time: 01/02/21  4:11 PM  Result Value Ref Range   Glucose-Capillary 336 (H) 70 - 99 mg/dL  Glucose, capillary     Status: Abnormal   Collection Time: 01/02/21  7:53 PM   Result Value Ref Range   Glucose-Capillary 277 (H) 70 - 99 mg/dL  Glucose, capillary     Status: Abnormal   Collection Time: 01/02/21 11:38 PM  Result Value Ref Range   Glucose-Capillary 304 (H) 70 - 99 mg/dL  Magnesium     Status: None   Collection Time: 01/03/21  1:43 AM  Result Value Ref Range   Magnesium 2.3 1.7 - 2.4 mg/dL  Phosphorus     Status: Abnormal   Collection Time: 01/03/21  1:43 AM  Result Value Ref Range   Phosphorus 1.6 (L) 2.5 - 4.6 mg/dL  CBC     Status: Abnormal   Collection Time: 01/03/21  1:43 AM  Result Value Ref Range   WBC 7.6 4.0 - 10.5 K/uL   RBC 3.48 (L) 4.22 - 5.81 MIL/uL   Hemoglobin 8.8 (L) 13.0 - 17.0 g/dL   HCT 29.4 (L) 39.0 - 52.0 %   MCV 84.5 80.0 - 100.0 fL   MCH 25.3 (L) 26.0 - 34.0 pg   MCHC 29.9 (L) 30.0 - 36.0 g/dL   RDW 16.8 (H) 11.5 - 15.5 %   Platelets 233 150 - 400 K/uL   nRBC 0.0 0.0 - 0.2 %  Basic metabolic panel     Status: Abnormal   Collection Time: 01/03/21  1:43 AM  Result Value Ref Range   Sodium 146 (H) 135 - 145 mmol/L   Potassium 4.0 3.5 - 5.1 mmol/L   Chloride 114 (H) 98 - 111 mmol/L   CO2 24 22 - 32 mmol/L   Glucose, Bld 325 (H) 70 - 99 mg/dL   BUN 35 (H) 8 - 23 mg/dL   Creatinine, Ser 0.87 0.61 - 1.24 mg/dL   Calcium 9.1 8.9 - 10.3 mg/dL   GFR, Estimated >60 >60 mL/min   Anion gap 8 5 - 15  Glucose, capillary     Status: Abnormal   Collection Time: 01/03/21  3:36 AM  Result Value Ref Range   Glucose-Capillary 315 (H) 70 - 99 mg/dL  I-STAT 7, (LYTES, BLD GAS, ICA, H+H)     Status: Abnormal   Collection Time: 01/03/21  3:43 AM  Result Value Ref Range   pH, Arterial 7.387 7.350 - 7.450   pCO2 arterial 41.6 32.0 - 48.0 mmHg   pO2, Arterial 79 (L) 83.0 - 108.0 mmHg   Bicarbonate 24.8 20.0 - 28.0 mmol/L   TCO2 26 22 - 32 mmol/L   O2 Saturation 95.0 %   Acid-Base Excess 0.0 0.0 - 2.0 mmol/L   Sodium 151 (H) 135 - 145 mmol/L   Potassium 3.8 3.5 - 5.1 mmol/L   Calcium, Ion 1.36 1.15 - 1.40 mmol/L  HCT 25.0  (L) 39.0 - 52.0 %   Hemoglobin 8.5 (L) 13.0 - 17.0 g/dL   Patient temperature 99.8 F    Collection site Radial    Drawn by RT    Sample type ARTERIAL   Glucose, capillary     Status: Abnormal   Collection Time: 01/03/21  7:15 AM  Result Value Ref Range   Glucose-Capillary 265 (H) 70 - 99 mg/dL    Assessment & Plan: Present on Admission: **None**    LOS: 6 days   Additional comments:I reviewed the patient's new clinical lab test results. . Fall down stairs 8/12  VDRF - significant secretions, guaifenisen, improving - 50% and PEEP to 8 ID - a lot of secretions, resp CX pending, maxipime/vanc empiric TBI/SAH/SDH - NSGY c/s, Dr. Annette Stable, starting to F/C. Significant frontal lobe injuries. Keppra x7d for sz ppx Occipital bone fx - NSGY c/s, Dr. Annette Stable Temporal bone fx extending into middle ear - ENT c/s, Dr. Constance Holster Right TM Rupture - ENT c/s, Dr. Constance Holster Hx DM2 - SSI, increase glargine to 20u BID Hx HTN - PRN meds FEN - NPO, lasix x 1, cortrak/TF, add free water for hypernatremia VTE - SCDs, no LMWH x1w per NSGY to start 8/21 ID - resp CX P, vanc/maxipime empiric Foley - replaced 8/16, on urecholine, voiding trial 8/19 Dispo - ICU Critical Care Total Time*: 35 Minutes  Georganna Skeans, MD, MPH, FACS Trauma & General Surgery Use [email or web address] to contact on call provider  01/03/2021  *Care during the described time interval was provided by me. I have reviewed this patient's available data, including medical history, events of note, physical examination and test results as part of my evaluation.

## 2021-01-03 NOTE — Progress Notes (Signed)
MD notified of irregular heart rhythm. Order for 12 lead EKG.

## 2021-01-03 NOTE — Progress Notes (Signed)
   Providing Compassionate, Quality Care - Together   Subjective: Nurse reports new arrhythmia overnight. EKG showed PACs and T-wave abnormality. No neurological changes.  Objective: Vital signs in last 24 hours: Temp:  [98 F (36.7 C)-102.7 F (39.3 C)] 98.8 F (37.1 C) (08/18 0722) Pulse Rate:  [61-88] 62 (08/18 0752) Resp:  [8-24] 20 (08/18 0752) BP: (91-201)/(36-86) 142/70 (08/18 0752) SpO2:  [89 %-98 %] 93 % (08/18 0752) FiO2 (%):  [40 %-60 %] 40 % (08/18 0752)  Intake/Output from previous day: 08/17 0701 - 08/18 0700 In: 2669.2 [I.V.:396.4; AU/QJ:3354; IV Piggyback:777.9] Out: 3890 [Urine:3890] Intake/Output this shift: No intake/output data recorded.  Responds to voice PERRLA MAE, unable to follow commands OG in place Intubated  Lab Results: Recent Labs    01/02/21 0452 01/03/21 0143 01/03/21 0343  WBC 8.5 7.6  --   HGB 8.7* 8.8* 8.5*  HCT 28.0* 29.4* 25.0*  PLT 239 233  --    BMET Recent Labs    01/02/21 0452 01/03/21 0143 01/03/21 0343  NA 143 146* 151*  K 4.1 4.0 3.8  CL 112* 114*  --   CO2 23 24  --   GLUCOSE 268* 325*  --   BUN 24* 35*  --   CREATININE 0.73 0.87  --   CALCIUM 8.9 9.1  --     Studies/Results: DG Chest Port 1 View  Result Date: 01/02/2021 CLINICAL DATA:  Respiratory failure. EXAM: PORTABLE CHEST 1 VIEW COMPARISON:  01/01/2021 FINDINGS: The endotracheal tube and feeding tube are in good position, unchanged. Stable mild cardiac enlargement and prominent mediastinal contours. Improved lung aeration with resolving edema and atelectasis. No pleural effusions or pneumothorax. IMPRESSION: Improved lung aeration with resolving edema and atelectasis. Stable support apparatus. Electronically Signed   By: Marijo Sanes M.D.   On: 01/02/2021 08:03   DG Chest Port 1 View  Result Date: 01/01/2021 CLINICAL DATA:  70 year old male with endotracheal tube and fall EXAM: PORTABLE CHEST 1 VIEW COMPARISON:  12/28/2020 FINDINGS: Endotracheal tube  unchanged, terminating 3.3 cm above the carina. Interval removal of the gastric tube in placement of enteric feeding tube which terminates out of the field of view. Low lung volumes persist with new airspace opacity on the right. No pneumothorax. No large pleural effusion. IMPRESSION: Low lung volumes, with new airspace opacity on the right, potentially atelectasis or new consolidation. Unchanged endotracheal tube. Interval removal of gastric tube and placement of enteric feeding tube Electronically Signed   By: Corrie Mckusick D.O.   On: 01/01/2021 12:43    Assessment/Plan: Patient presented to the [facility] ED on 12/28/2020 with SAH and skull fractures following a fall down about 8 steps. He remains intubated and sedated for airway protection. No Neurosurgical intervention recommended. Patient not ready for extubation due to mental status and increased ventilator requirements secondary to pneumonia.   LOS: 6 days   -Continue supportive care -Can restart Lovenox on 01/06/2021    Viona Gilmore, DNP, AGNP-C Nurse Practitioner  [facility] [place]. [address], [address], [place], [place] [address] P: [phone]    F: [phone]  01/03/2021, 10:09 AM

## 2021-01-04 LAB — BASIC METABOLIC PANEL
Anion gap: 9 (ref 5–15)
BUN: 40 mg/dL — ABNORMAL HIGH (ref 8–23)
CO2: 24 mmol/L (ref 22–32)
Calcium: 9.3 mg/dL (ref 8.9–10.3)
Chloride: 114 mmol/L — ABNORMAL HIGH (ref 98–111)
Creatinine, Ser: 0.9 mg/dL (ref 0.61–1.24)
GFR, Estimated: >60 mL/min (ref >60)
Glucose, Bld: 328 mg/dL — ABNORMAL HIGH (ref 70–99)
Potassium: 3.6 mmol/L (ref 3.5–5.1)
Sodium: 147 mmol/L — ABNORMAL HIGH (ref 135–145)

## 2021-01-04 LAB — CBC
HCT: 29.8 % — ABNORMAL LOW (ref 39.0–52.0)
Hemoglobin: 9.3 g/dL — ABNORMAL LOW (ref 13.0–17.0)
MCH: 25.8 pg — ABNORMAL LOW (ref 26.0–34.0)
MCHC: 31.2 g/dL (ref 30.0–36.0)
MCV: 82.5 fL (ref 80.0–100.0)
Platelets: 249 10*3/uL (ref 150–400)
RBC: 3.61 MIL/uL — ABNORMAL LOW (ref 4.22–5.81)
RDW: 16.8 % — ABNORMAL HIGH (ref 11.5–15.5)
WBC: 7.7 10*3/uL (ref 4.0–10.5)
nRBC: 0.4 % — ABNORMAL HIGH (ref 0.0–0.2)

## 2021-01-04 LAB — GLUCOSE, CAPILLARY
Glucose-Capillary: 277 mg/dL — ABNORMAL HIGH (ref 70–99)
Glucose-Capillary: 291 mg/dL — ABNORMAL HIGH (ref 70–99)
Glucose-Capillary: 292 mg/dL — ABNORMAL HIGH (ref 70–99)
Glucose-Capillary: 296 mg/dL — ABNORMAL HIGH (ref 70–99)
Glucose-Capillary: 311 mg/dL — ABNORMAL HIGH (ref 70–99)
Glucose-Capillary: 317 mg/dL — ABNORMAL HIGH (ref 70–99)
Glucose-Capillary: 325 mg/dL — ABNORMAL HIGH (ref 70–99)

## 2021-01-04 LAB — PHOSPHORUS: Phosphorus: 2.6 mg/dL (ref 2.5–4.6)

## 2021-01-04 LAB — MAGNESIUM: Magnesium: 2 mg/dL (ref 1.7–2.4)

## 2021-01-04 MED ORDER — INSULIN ASPART 100 UNIT/ML IJ SOLN
6.0000 [IU] | INTRAMUSCULAR | Status: DC
  Administered 2021-01-04 – 2021-01-05 (×6): 6 [IU] via SUBCUTANEOUS

## 2021-01-04 MED ORDER — ENOXAPARIN SODIUM 30 MG/0.3ML IJ SOSY
30.0000 mg | PREFILLED_SYRINGE | Freq: Two times a day (BID) | INTRAMUSCULAR | Status: DC
  Administered 2021-01-06 (×2): 30 mg via SUBCUTANEOUS
  Filled 2021-01-04 (×2): qty 0.3

## 2021-01-04 MED ORDER — AMIODARONE HCL IN DEXTROSE 360-4.14 MG/200ML-% IV SOLN
60.0000 mg/h | INTRAVENOUS | Status: AC
  Administered 2021-01-04 (×2): 60 mg/h via INTRAVENOUS
  Filled 2021-01-04: qty 200

## 2021-01-04 MED ORDER — AMIODARONE LOAD VIA INFUSION
150.0000 mg | Freq: Once | INTRAVENOUS | Status: AC
  Administered 2021-01-04: 150 mg via INTRAVENOUS
  Filled 2021-01-04: qty 83.34

## 2021-01-04 MED ORDER — AMIODARONE IV BOLUS ONLY 150 MG/100ML
150.0000 mg | Freq: Once | INTRAVENOUS | Status: AC
  Administered 2021-01-04: 150 mg via INTRAVENOUS

## 2021-01-04 MED ORDER — FUROSEMIDE 10 MG/ML IJ SOLN
20.0000 mg | Freq: Once | INTRAMUSCULAR | Status: AC
  Administered 2021-01-04: 20 mg via INTRAVENOUS
  Filled 2021-01-04: qty 2

## 2021-01-04 MED ORDER — AMIODARONE HCL IN DEXTROSE 360-4.14 MG/200ML-% IV SOLN
60.0000 mg/h | INTRAVENOUS | Status: DC
  Administered 2021-01-04: 60 mg/h via INTRAVENOUS
  Administered 2021-01-04: 30 mg/h via INTRAVENOUS
  Administered 2021-01-04 – 2021-01-06 (×8): 60 mg/h via INTRAVENOUS
  Administered 2021-01-07: 30 mg/h via INTRAVENOUS
  Administered 2021-01-07 – 2021-01-10 (×13): 60 mg/h via INTRAVENOUS
  Filled 2021-01-04 (×4): qty 200
  Filled 2021-01-04: qty 400
  Filled 2021-01-04 (×19): qty 200

## 2021-01-04 NOTE — Progress Notes (Signed)
No significant change in status.  Patient will awaken to's vigorous stimulus.  He appears minimally aware.  He is purposeful but is not readily follow commands.  Overall stable.  Continue supportive efforts.  No new recommendations.

## 2021-01-04 NOTE — Progress Notes (Signed)
Trauma/Critical Care Follow Up Note  Subjective:    Overnight Issues:   Objective:  Vital signs for last 24 hours: Temp:  [99.9 F (37.7 C)-101.8 F (38.8 C)] 101.8 F (38.8 C) (08/19 0800) Pulse Rate:  [56-125] 119 (08/19 0908) Resp:  [12-23] 22 (08/19 0908) BP: (112-181)/(51-115) 112/57 (08/19 0908) SpO2:  [90 %-98 %] 96 % (08/19 0908) FiO2 (%):  [40 %] 40 % (08/19 0800)  Hemodynamic parameters for last 24 hours:    Intake/Output from previous day: 08/18 0701 - 08/19 0700 In: 3419.1 [P.O.:200; I.V.:541.3; NG/GT:2175; IV Piggyback:502.8] Out: 4325 [Urine:4325]  Intake/Output this shift: Total I/O In: 625.4 [I.V.:395.4; NG/GT:130; IV Piggyback:100] Out: 250 [Urine:250]  Vent settings for last 24 hours: Vent Mode: PRVC FiO2 (%):  [40 %] 40 % Set Rate:  [20 bmp] 20 bmp Vt Set:  [650 mL] 650 mL PEEP:  [8 cmH20] 8 cmH20 Plateau Pressure:  [24 cmH20-27 cmH20] 26 cmH20  Physical Exam:  Gen: comfortable, no distress Neuro: does not follow commands HEENT: intubated Neck: supple CV: RRR Pulm: unlabored breathing, mechanically ventilated Abd: soft, obese, distended, tympanitic, +BM GU: clear, yellow urine Extr: wwp, no edema   Results for orders placed or performed during the hospital encounter of 12/28/20 (from the past 24 hour(s))  Glucose, capillary     Status: Abnormal   Collection Time: 01/03/21 12:02 PM  Result Value Ref Range   Glucose-Capillary 278 (H) 70 - 99 mg/dL  Glucose, capillary     Status: Abnormal   Collection Time: 01/03/21  4:41 PM  Result Value Ref Range   Glucose-Capillary 281 (H) 70 - 99 mg/dL  Glucose, capillary     Status: Abnormal   Collection Time: 01/03/21  7:35 PM  Result Value Ref Range   Glucose-Capillary 276 (H) 70 - 99 mg/dL  Glucose, capillary     Status: Abnormal   Collection Time: 01/04/21 12:26 AM  Result Value Ref Range   Glucose-Capillary 292 (H) 70 - 99 mg/dL  Glucose, capillary     Status: Abnormal   Collection  Time: 01/04/21  3:58 AM  Result Value Ref Range   Glucose-Capillary 325 (H) 70 - 99 mg/dL  Magnesium     Status: None   Collection Time: 01/04/21  4:56 AM  Result Value Ref Range   Magnesium 2.0 1.7 - 2.4 mg/dL  Phosphorus     Status: None   Collection Time: 01/04/21  4:56 AM  Result Value Ref Range   Phosphorus 2.6 2.5 - 4.6 mg/dL  CBC     Status: Abnormal   Collection Time: 01/04/21  4:56 AM  Result Value Ref Range   WBC 7.7 4.0 - 10.5 K/uL   RBC 3.61 (L) 4.22 - 5.81 MIL/uL   Hemoglobin 9.3 (L) 13.0 - 17.0 g/dL   HCT 29.8 (L) 39.0 - 52.0 %   MCV 82.5 80.0 - 100.0 fL   MCH 25.8 (L) 26.0 - 34.0 pg   MCHC 31.2 30.0 - 36.0 g/dL   RDW 16.8 (H) 11.5 - 15.5 %   Platelets 249 150 - 400 K/uL   nRBC 0.4 (H) 0.0 - 0.2 %  Basic metabolic panel     Status: Abnormal   Collection Time: 01/04/21  4:56 AM  Result Value Ref Range   Sodium 147 (H) 135 - 145 mmol/L   Potassium 3.6 3.5 - 5.1 mmol/L   Chloride 114 (H) 98 - 111 mmol/L   CO2 24 22 - 32 mmol/L   Glucose, Bld  328 (H) 70 - 99 mg/dL   BUN 40 (H) 8 - 23 mg/dL   Creatinine, Ser 0.90 0.61 - 1.24 mg/dL   Calcium 9.3 8.9 - 10.3 mg/dL   GFR, Estimated >60 >60 mL/min   Anion gap 9 5 - 15  Glucose, capillary     Status: Abnormal   Collection Time: 01/04/21  8:33 AM  Result Value Ref Range   Glucose-Capillary 311 (H) 70 - 99 mg/dL    Assessment & Plan: The plan of care was discussed with the bedside nurse for the day, Jen L, who is in agreement with this plan and no additional concerns were raised.   Present on Admission: **None**    LOS: 7 days   Additional comments:I reviewed the patient's new clinical lab test results.   and I reviewed the patients new imaging test results.    Fall down stairs 8/12   VDRF - significant secretions, guaifenisen, improving - 50% and PEEP to 8 ID - a lot of secretions, resp CX with Strep, maxipime day 3, if remains febrile 8/20 send UA, bcx and duplex BLE TBI/SAH/SDH - NSGY c/s, Dr. Annette Stable,  starting to F/C. Significant frontal lobe injuries. Keppra x7d for sz ppx Occipital bone fx - NSGY c/s, Dr. Annette Stable Temporal bone fx extending into middle ear - ENT c/s, Dr. Constance Holster Right TM Rupture - ENT c/s, Dr. Constance Holster AFRVR - amio gtt, add'l bolus this AM Hx DM2 - adjust SSI, increase glargine to 20u BID Hx HTN - PRN meds FEN - NPO, lasix x1, cortrak/TF, distended, but tol TF and having BM, free water for hypernatremia, discuss with RD changing TF formulation for hyperglycemia VTE - SCDs, no LMWH x1w per NSGY to start 8/21 Foley - replaced 8/16, on urecholine, voiding trial 8/19 Dispo - ICU    Critical Care Total Time: 35 minutes  Jesusita Oka, MD Trauma & General Surgery Please use [email or web address] to contact on call provider  01/04/2021  *Care during the described time interval was provided by me. I have reviewed this patient's available data, including medical history, events of note, physical examination and test results as part of my evaluation.

## 2021-01-04 NOTE — Progress Notes (Signed)
Inpatient Diabetes Program Recommendations  AACE/ADA: New Consensus Statement on Inpatient Glycemic Control (2015)  Target Ranges:  Prepandial:   less than 140 mg/dL      Peak postprandial:   less than 180 mg/dL (1-2 hours)      Critically ill patients:  140 - 180 mg/dL   Lab Results  Component Value Date   GLUCAP 325 (H) 01/04/2021    Review of Glycemic Control Results for Angel [name], Angel [name] (MRN [id number]) as of 01/04/2021 07:55  Ref. Range 01/03/2021 07:15 01/03/2021 12:02 01/03/2021 16:41 01/03/2021 19:35 01/04/2021 00:26 01/04/2021 03:58  Glucose-Capillary Latest Ref Range: 70 - 99 mg/dL 265 (H) 278 (H) 281 (H) 276 (H) 292 (H) 325 (H)   Inpatient Diabetes Program Recommendations:   CBGs continue >180.  Please consider ordering Novolog 6 units Q4H for tube feeding coverage. If tube feeding is stopped or held then Novolog tube feeding coverage should also be stopped or held.  Thank you, Nani Gasser. Cahterine Heinzel, RN, MSN, CDE  Diabetes Coordinator Inpatient Glycemic Control Team Team Pager [phone] (8am-5pm) 01/04/2021 7:57 AM

## 2021-01-04 NOTE — Progress Notes (Signed)
   01/04/21 0334  Provider Notification  Provider Name/Title Dr. [name]  Date Provider Notified 01/04/21  Time Provider Notified [phone]  Notification Type Page  Notification Reason Change in status (A fib RVR)  Provider response See new orders  Date of Provider Response 01/04/21  Time of Provider Response [phone]

## 2021-01-04 NOTE — Progress Notes (Signed)
Wife given wedding ring to take home.

## 2021-01-05 ENCOUNTER — Inpatient Hospital Stay (HOSPITAL_COMMUNITY): Payer: PPO

## 2021-01-05 DIAGNOSIS — R109 Unspecified abdominal pain: Secondary | ICD-10-CM | POA: Diagnosis not present

## 2021-01-05 LAB — CBC
HCT: 33.3 % — ABNORMAL LOW (ref 39.0–52.0)
Hemoglobin: 10.2 g/dL — ABNORMAL LOW (ref 13.0–17.0)
MCH: 25.8 pg — ABNORMAL LOW (ref 26.0–34.0)
MCHC: 30.6 g/dL (ref 30.0–36.0)
MCV: 84.1 fL (ref 80.0–100.0)
Platelets: 274 10*3/uL (ref 150–400)
RBC: 3.96 MIL/uL — ABNORMAL LOW (ref 4.22–5.81)
RDW: 16.7 % — ABNORMAL HIGH (ref 11.5–15.5)
WBC: 10.1 10*3/uL (ref 4.0–10.5)
nRBC: 0.4 % — ABNORMAL HIGH (ref 0.0–0.2)

## 2021-01-05 LAB — GLUCOSE, CAPILLARY
Glucose-Capillary: 267 mg/dL — ABNORMAL HIGH (ref 70–99)
Glucose-Capillary: 262 mg/dL — ABNORMAL HIGH (ref 70–99)
Glucose-Capillary: 266 mg/dL — ABNORMAL HIGH (ref 70–99)
Glucose-Capillary: 281 mg/dL — ABNORMAL HIGH (ref 70–99)
Glucose-Capillary: 286 mg/dL — ABNORMAL HIGH (ref 70–99)
Glucose-Capillary: 286 mg/dL — ABNORMAL HIGH (ref 70–99)

## 2021-01-05 LAB — URINALYSIS, ROUTINE W REFLEX MICROSCOPIC
Bacteria, UA: NONE SEEN
Bilirubin Urine: NEGATIVE
Glucose, UA: 150 mg/dL — AB
Ketones, ur: 5 mg/dL — AB
Leukocytes,Ua: NEGATIVE
Nitrite: NEGATIVE
Protein, ur: 30 mg/dL — AB
Specific Gravity, Urine: 1.029 (ref 1.005–1.030)
pH: 5 (ref 5.0–8.0)

## 2021-01-05 LAB — BASIC METABOLIC PANEL
Anion gap: 9 (ref 5–15)
BUN: 41 mg/dL — ABNORMAL HIGH (ref 8–23)
CO2: 25 mmol/L (ref 22–32)
Calcium: 9.1 mg/dL (ref 8.9–10.3)
Chloride: 113 mmol/L — ABNORMAL HIGH (ref 98–111)
Creatinine, Ser: 0.89 mg/dL (ref 0.61–1.24)
GFR, Estimated: >60 mL/min (ref >60)
Glucose, Bld: 305 mg/dL — ABNORMAL HIGH (ref 70–99)
Potassium: 4.4 mmol/L (ref 3.5–5.1)
Sodium: 147 mmol/L — ABNORMAL HIGH (ref 135–145)

## 2021-01-05 MED ORDER — INSULIN ASPART 100 UNIT/ML IJ SOLN
10.0000 [IU] | INTRAMUSCULAR | Status: DC
  Administered 2021-01-05 – 2021-02-05 (×141): 10 [IU] via SUBCUTANEOUS

## 2021-01-05 NOTE — Progress Notes (Signed)
Patient ID: Angel [name], male   DOB: 1950-07-25, 70 y.o.   MRN: [id number] Patient does open eyes to voice and move purposefully all 4 extremities no significant change in exam continue supportive care

## 2021-01-05 NOTE — Plan of Care (Signed)

## 2021-01-05 NOTE — Progress Notes (Signed)
   Subjective/Chief Complaint: PT with no acute changes Abd distended Having BMs   Objective: Vital signs in last 24 hours: Temp:  [98.5 F (36.9 C)-102.2 F (39 C)] 101.1 F (38.4 C) (08/20 0926) Pulse Rate:  [95-125] 125 (08/20 0807) Resp:  [13-34] 20 (08/20 0807) BP: (84-116)/(53-77) 99/76 (08/20 0800) SpO2:  [93 %-98 %] 96 % (08/20 0807) FiO2 (%):  [40 %] 40 % (08/20 0807) Last BM Date: 01/05/21  Intake/Output from previous day: 08/19 0701 - 08/20 0700 In: 3648.4 [I.V.:1453.4; NG/GT:1775; IV Piggyback:300] Out: 2125 [Urine:1925; Stool:200] Intake/Output this shift: No intake/output data recorded.   Physical Exam:  Gen: comfortable, no distress Neuro: arouses, does not follow commands HEENT: intubated Neck: supple CV: RRR Pulm: unlabored breathing, mechanically ventilated Abd: soft, obese, distended, tympanitic, +BM GU: clear, yellow urine Extr: wwp, no edema  Lab Results:  Recent Labs    01/04/21 0456 01/05/21 0217  WBC 7.7 10.1  HGB 9.3* 10.2*  HCT 29.8* 33.3*  PLT 249 274   BMET Recent Labs    01/04/21 0456 01/05/21 0217  NA 147* 147*  K 3.6 4.4  CL 114* 113*  CO2 24 25  GLUCOSE 328* 305*  BUN 40* 41*  CREATININE 0.90 0.89  CALCIUM 9.3 9.1   PT/INR No results for input(s): LABPROT, INR in the last 72 hours. ABG Recent Labs    01/03/21 0343  PHART 7.387  HCO3 24.8    Studies/Results: No results found.  Anti-infectives: Anti-infectives (From admission, onward)    Start     Dose/Rate Route Frequency Ordered Stop   01/03/21 0600  vancomycin (VANCOREADY) IVPB 1250 mg/250 mL  Status:  Discontinued        1,250 mg 166.7 mL/hr over 90 Minutes Intravenous Every 12 hours 01/02/21 1717 01/03/21 0837   01/02/21 1800  vancomycin (VANCOREADY) IVPB 2000 mg/400 mL        2,000 mg 200 mL/hr over 120 Minutes Intravenous  Once 01/02/21 1712 01/02/21 2007   01/02/21 0900  ceFEPIme (MAXIPIME) 2 g in sodium chloride 0.9 % 100 mL IVPB        2  g 200 mL/hr over 30 Minutes Intravenous Every 8 hours 01/02/21 0849         Assessment/Plan: Fall down stairs 8/12   VDRF - significant secretions, guaifenisen, improving - 50% and PEEP to 8 ID - a lot of secretions, resp CX with Strep, maxipime day 4, Sending UA, bcx and duplex BLE TBI/SAH/SDH - NSGY c/s, Dr. Annette Stable, starting to F/C. Significant frontal lobe injuries. Keppra x7d for sz ppx Occipital bone fx - NSGY c/s, Dr. Annette Stable Temporal bone fx extending into middle ear - ENT c/s, Dr. Constance Holster Right TM Rupture - ENT c/s, Dr. Constance Holster AFRVR - amio gtt, add'l bolus this AM Hx DM2 - adjust SSI, increase glargine to 20u BID Hx HTN - PRN meds FEN - NPO, lasix x1, cortrak/TF-hold TFs until KUB done, distended, but tol TF and having BM, free water for hypernatremia, discuss with RD changing TF formulation for hyperglycemia VTE - SCDs, no LMWH x1w per NSGY to start 8/21 Foley - replaced 8/16, on urecholine, voiding trial 8/19 Dispo - ICU     Critical Care Total Time: 35 minutes   LOS: 8 days    Ralene Ok 01/05/2021

## 2021-01-05 NOTE — Progress Notes (Signed)
Inpatient Diabetes Program Recommendations  AACE/ADA: New Consensus Statement on Inpatient Glycemic Control (2015)  Target Ranges:  Prepandial:   less than 140 mg/dL      Peak postprandial:   less than 180 mg/dL (1-2 hours)      Critically ill patients:  140 - 180 mg/dL   Lab Results  Component Value Date   GLUCAP 262 (H) 01/05/2021    Review of Glycemic Control Results for Angel [name], Angel [name] (MRN [id number]) as of 01/05/2021 08:52  Ref. Range 01/04/2021 16:20 01/04/2021 19:40 01/04/2021 23:51 01/05/2021 03:44 01/05/2021 07:55  Glucose-Capillary Latest Ref Range: 70 - 99 mg/dL 296 (H) 291 (H) 277 (H) 267 (H) 262 (H)    Inpatient Diabetes Program Recommendations:   -IV insulin per ICU protocol  If not, increase tube feed coverage to 10 units q 4 hrs.(Hold if tube feed stopped or held for any reason)  Thank you, Bethena Roys E. Shannie Kontos, RN, MSN, CDE  Diabetes Coordinator Inpatient Glycemic Control Team Team Pager [phone] (8am-5pm) 01/05/2021 8:54 AM

## 2021-01-06 ENCOUNTER — Inpatient Hospital Stay (HOSPITAL_COMMUNITY): Payer: PPO

## 2021-01-06 DIAGNOSIS — R509 Fever, unspecified: Secondary | ICD-10-CM

## 2021-01-06 LAB — CBC
HCT: 31.5 % — ABNORMAL LOW (ref 39.0–52.0)
Hemoglobin: 10.1 g/dL — ABNORMAL LOW (ref 13.0–17.0)
MCH: 26 pg (ref 26.0–34.0)
MCHC: 32.1 g/dL (ref 30.0–36.0)
MCV: 81 fL (ref 80.0–100.0)
Platelets: UNDETERMINED 10*3/uL (ref 150–400)
RBC: 3.89 MIL/uL — ABNORMAL LOW (ref 4.22–5.81)
RDW: 16.6 % — ABNORMAL HIGH (ref 11.5–15.5)
WBC: 7.6 10*3/uL (ref 4.0–10.5)
nRBC: 0.3 % — ABNORMAL HIGH (ref 0.0–0.2)

## 2021-01-06 LAB — BASIC METABOLIC PANEL
Anion gap: 11 (ref 5–15)
BUN: 39 mg/dL — ABNORMAL HIGH (ref 8–23)
CO2: 24 mmol/L (ref 22–32)
Calcium: 8.9 mg/dL (ref 8.9–10.3)
Chloride: 110 mmol/L (ref 98–111)
Creatinine, Ser: 0.93 mg/dL (ref 0.61–1.24)
GFR, Estimated: >60 mL/min (ref >60)
Glucose, Bld: 273 mg/dL — ABNORMAL HIGH (ref 70–99)
Potassium: 3.7 mmol/L (ref 3.5–5.1)
Sodium: 145 mmol/L (ref 135–145)

## 2021-01-06 LAB — GLUCOSE, CAPILLARY
Glucose-Capillary: 208 mg/dL — ABNORMAL HIGH (ref 70–99)
Glucose-Capillary: 218 mg/dL — ABNORMAL HIGH (ref 70–99)
Glucose-Capillary: 240 mg/dL — ABNORMAL HIGH (ref 70–99)
Glucose-Capillary: 253 mg/dL — ABNORMAL HIGH (ref 70–99)
Glucose-Capillary: 254 mg/dL — ABNORMAL HIGH (ref 70–99)
Glucose-Capillary: 276 mg/dL — ABNORMAL HIGH (ref 70–99)

## 2021-01-06 NOTE — Progress Notes (Signed)
BLE venous duplex has been completed.  Results can be found under chart review under CV PROC. 01/06/2021 4:56 PM Tezra Mahr RVT, RDMS

## 2021-01-06 NOTE — Progress Notes (Signed)
Patient ID: Angel [name], male   DOB: [birth year], 70 y.o.   MRN: [id number] No significant change neurologically patient still purposeful all 4 extremities will not follow commands  No new neurosurgical recommendations

## 2021-01-06 NOTE — Progress Notes (Signed)
Subjective/Chief Complaint: Pt with no acute changes Abd distended-having BMs Afib   Objective: Vital signs in last 24 hours: Temp:  [99.8 F (37.7 C)-101.1 F (38.4 C)] 99.8 F (37.7 C) (08/21 0800) Pulse Rate:  [91-128] 120 (08/21 0812) Resp:  [18-23] 19 (08/21 0812) BP: (101-177)/(54-102) 116/74 (08/21 0600) SpO2:  [92 %-100 %] 100 % (08/21 0812) FiO2 (%):  [40 %] 40 % (08/21 0812) Weight:  [130.1 kg] 130.1 kg (08/21 0554) Last BM Date: 01/06/21  Intake/Output from previous day: 08/20 0701 - 08/21 0700 In: 3770.4 [I.V.:1294.4; NG/GT:2116; IV Piggyback:300] Out: 2050 [Urine:1550; Stool:500] Intake/Output this shift: No intake/output data recorded.  Physical Exam:  Gen: comfortable, no distress Neuro: arouses, does not follow commands HEENT: intubated Neck: supple CV: RRR Pulm: unlabored breathing, mechanically ventilated Abd: soft, obese, distended, tympanitic, +BM GU: clear, yellow urine Extr: wwp, no edema   Lab Results:  Recent Labs    01/05/21 0217 01/06/21 0435  WBC 10.1 7.6  HGB 10.2* 10.1*  HCT 33.3* 31.5*  PLT 274 PLATELET CLUMPS NOTED ON SMEAR, UNABLE TO ESTIMATE   BMET Recent Labs    01/05/21 0217 01/06/21 0435  NA 147* 145  K 4.4 3.7  CL 113* 110  CO2 25 24  GLUCOSE 305* 273*  BUN 41* 39*  CREATININE 0.89 0.93  CALCIUM 9.1 8.9  Studies/Results: DG Abd Portable 1V  Result Date: 01/05/2021 CLINICAL DATA:  Abdominal pain. EXAM: PORTABLE ABDOMEN - 1 VIEW COMPARISON:  12/31/2020 FINDINGS: Multiple supine views. Feeding tube is looped in the stomach. Mildly prominent gas-filled colon followed to the level of the sigmoid. No gross free intraperitoneal air. No gaseous distension of small bowel loops. IMPRESSION: Mildly prominent, gas-filled colon to the level of the sigmoid. Most likely due to adynamic ileus. Distal obstruction could look similar. Feeding tube coiled in the stomach. Electronically Signed   By: Abigail Miyamoto M.D.   On:  01/05/2021 10:48    Anti-infectives: Anti-infectives (From admission, onward)    Start     Dose/Rate Route Frequency Ordered Stop   01/03/21 0600  vancomycin (VANCOREADY) IVPB 1250 mg/250 mL  Status:  Discontinued        1,250 mg 166.7 mL/hr over 90 Minutes Intravenous Every 12 hours 01/02/21 1717 01/03/21 0837   01/02/21 1800  vancomycin (VANCOREADY) IVPB 2000 mg/400 mL        2,000 mg 200 mL/hr over 120 Minutes Intravenous  Once 01/02/21 1712 01/02/21 2007   01/02/21 0900  ceFEPIme (MAXIPIME) 2 g in sodium chloride 0.9 % 100 mL IVPB        2 g 200 mL/hr over 30 Minutes Intravenous Every 8 hours 01/02/21 0849         Assessment/Plan: Fall down stairs 8/12   VDRF - significant secretions, guaifenisen, improving - 50% and PEEP to 8 ID - a lot of secretions, resp CX with Strep, maxipime day 5, UA (-), BCx: pending and duplex BLE-pending TBI/SAH/SDH - NSGY c/s, Dr. Annette Stable, starting to F/C. Significant frontal lobe injuries. Keppra x7d for sz ppx Occipital bone fx - NSGY c/s, Dr. Annette Stable Temporal bone fx extending into middle ear - ENT c/s, Dr. Constance Holster Right TM Rupture - ENT c/s, Dr. Constance Holster AFRVR - amio gtt, add'l bolus this AM Hx DM2 - adjust SSI, increase glargine to 20u BID Hx HTN - PRN meds FEN - NPO, lasix x1, cortrak/TF-hold TFs until KUB done, distended, but tol TF and having BM, free water for hypernatremia, discuss with RD changing  TF formulation for hyperglycemia VTE - SCDs, no LMWH x1w per NSGY to start 8/21 Foley - replaced 8/16, on urecholine, voiding trial 8/19 Dispo - ICU     Critical Care Total Time: 35 minutes   LOS: 9 days    Ralene Ok 01/06/2021

## 2021-01-07 ENCOUNTER — Encounter (HOSPITAL_COMMUNITY): Payer: Self-pay

## 2021-01-07 ENCOUNTER — Inpatient Hospital Stay (HOSPITAL_COMMUNITY): Payer: PPO

## 2021-01-07 DIAGNOSIS — R579 Shock, unspecified: Secondary | ICD-10-CM | POA: Diagnosis not present

## 2021-01-07 DIAGNOSIS — I152 Hypertension secondary to endocrine disorders: Secondary | ICD-10-CM

## 2021-01-07 DIAGNOSIS — J9690 Respiratory failure, unspecified, unspecified whether with hypoxia or hypercapnia: Secondary | ICD-10-CM | POA: Diagnosis not present

## 2021-01-07 DIAGNOSIS — A419 Sepsis, unspecified organism: Secondary | ICD-10-CM | POA: Diagnosis not present

## 2021-01-07 DIAGNOSIS — I609 Nontraumatic subarachnoid hemorrhage, unspecified: Secondary | ICD-10-CM | POA: Diagnosis not present

## 2021-01-07 DIAGNOSIS — I480 Paroxysmal atrial fibrillation: Secondary | ICD-10-CM

## 2021-01-07 DIAGNOSIS — I4819 Other persistent atrial fibrillation: Secondary | ICD-10-CM | POA: Diagnosis not present

## 2021-01-07 DIAGNOSIS — I4891 Unspecified atrial fibrillation: Secondary | ICD-10-CM

## 2021-01-07 DIAGNOSIS — E1159 Type 2 diabetes mellitus with other circulatory complications: Secondary | ICD-10-CM | POA: Diagnosis not present

## 2021-01-07 DIAGNOSIS — I2699 Other pulmonary embolism without acute cor pulmonale: Secondary | ICD-10-CM | POA: Diagnosis not present

## 2021-01-07 DIAGNOSIS — I700 Atherosclerosis of aorta: Secondary | ICD-10-CM | POA: Diagnosis not present

## 2021-01-07 LAB — POCT I-STAT 7, (LYTES, BLD GAS, ICA,H+H)
Acid-Base Excess: 1 mmol/L (ref 0.0–2.0)
Bicarbonate: 25.5 mmol/L (ref 20.0–28.0)
Calcium, Ion: 1.33 mmol/L (ref 1.15–1.40)
HCT: 27 % — ABNORMAL LOW (ref 39.0–52.0)
Hemoglobin: 9.2 g/dL — ABNORMAL LOW (ref 13.0–17.0)
O2 Saturation: 96 %
Patient temperature: 99.8
Potassium: 3.8 mmol/L (ref 3.5–5.1)
Sodium: 148 mmol/L — ABNORMAL HIGH (ref 135–145)
TCO2: 27 mmol/L (ref 22–32)
pCO2 arterial: 39.6 mmHg (ref 32.0–48.0)
pH, Arterial: 7.421 (ref 7.350–7.450)
pO2, Arterial: 85 mmHg (ref 83.0–108.0)

## 2021-01-07 LAB — BASIC METABOLIC PANEL
Anion gap: 8 (ref 5–15)
BUN: 36 mg/dL — ABNORMAL HIGH (ref 8–23)
CO2: 25 mmol/L (ref 22–32)
Calcium: 9 mg/dL (ref 8.9–10.3)
Chloride: 113 mmol/L — ABNORMAL HIGH (ref 98–111)
Creatinine, Ser: 0.98 mg/dL (ref 0.61–1.24)
GFR, Estimated: >60 mL/min (ref >60)
Glucose, Bld: 289 mg/dL — ABNORMAL HIGH (ref 70–99)
Potassium: 3.9 mmol/L (ref 3.5–5.1)
Sodium: 146 mmol/L — ABNORMAL HIGH (ref 135–145)

## 2021-01-07 LAB — ECHOCARDIOGRAM COMPLETE
AR max vel: 2.68 cm2
AV Area VTI: 2.97 cm2
AV Area mean vel: 2.33 cm2
AV Mean grad: 5 mmHg
AV Peak grad: 9.1 mmHg
Ao pk vel: 1.51 m/s
Height: 74 in
S' Lateral: 3.2 cm
Weight: 4716.08 oz

## 2021-01-07 LAB — CBC
HCT: 33.1 % — ABNORMAL LOW (ref 39.0–52.0)
Hemoglobin: 10 g/dL — ABNORMAL LOW (ref 13.0–17.0)
MCH: 25.4 pg — ABNORMAL LOW (ref 26.0–34.0)
MCHC: 30.2 g/dL (ref 30.0–36.0)
MCV: 84 fL (ref 80.0–100.0)
Platelets: 248 10*3/uL (ref 150–400)
RBC: 3.94 MIL/uL — ABNORMAL LOW (ref 4.22–5.81)
RDW: 16.7 % — ABNORMAL HIGH (ref 11.5–15.5)
WBC: 12.1 10*3/uL — ABNORMAL HIGH (ref 4.0–10.5)
nRBC: 0.2 % (ref 0.0–0.2)

## 2021-01-07 LAB — GLUCOSE, CAPILLARY
Glucose-Capillary: 249 mg/dL — ABNORMAL HIGH (ref 70–99)
Glucose-Capillary: 252 mg/dL — ABNORMAL HIGH (ref 70–99)
Glucose-Capillary: 267 mg/dL — ABNORMAL HIGH (ref 70–99)
Glucose-Capillary: 282 mg/dL — ABNORMAL HIGH (ref 70–99)
Glucose-Capillary: 288 mg/dL — ABNORMAL HIGH (ref 70–99)
Glucose-Capillary: 293 mg/dL — ABNORMAL HIGH (ref 70–99)

## 2021-01-07 MED ORDER — INSULIN GLARGINE-YFGN 100 UNIT/ML ~~LOC~~ SOLN
33.0000 [IU] | Freq: Two times a day (BID) | SUBCUTANEOUS | Status: DC
  Administered 2021-01-07 – 2021-01-08 (×3): 33 [IU] via SUBCUTANEOUS
  Filled 2021-01-07 (×4): qty 0.33

## 2021-01-07 MED ORDER — ENOXAPARIN SODIUM 40 MG/0.4ML IJ SOSY
40.0000 mg | PREFILLED_SYRINGE | Freq: Two times a day (BID) | INTRAMUSCULAR | Status: DC
  Administered 2021-01-07 (×2): 40 mg via SUBCUTANEOUS
  Filled 2021-01-07 (×2): qty 0.4

## 2021-01-07 MED ORDER — HYALURONIDASE HUMAN 150 UNIT/ML IJ SOLN
150.0000 [IU] | INTRAMUSCULAR | Status: AC
  Administered 2021-01-07: 150 [IU] via SUBCUTANEOUS
  Filled 2021-01-07: qty 1

## 2021-01-07 MED ORDER — DEXMEDETOMIDINE HCL IN NACL 400 MCG/100ML IV SOLN
0.4000 ug/kg/h | INTRAVENOUS | Status: DC
Start: 2021-01-07 — End: 2021-01-11
  Administered 2021-01-07: 0.6 ug/kg/h via INTRAVENOUS
  Administered 2021-01-07 – 2021-01-08 (×4): 0.4 ug/kg/h via INTRAVENOUS
  Administered 2021-01-08: 0.7 ug/kg/h via INTRAVENOUS
  Administered 2021-01-09: 0.5 ug/kg/h via INTRAVENOUS
  Administered 2021-01-09: 0.9 ug/kg/h via INTRAVENOUS
  Administered 2021-01-09 (×3): 0.7 ug/kg/h via INTRAVENOUS
  Administered 2021-01-09: 0.6 ug/kg/h via INTRAVENOUS
  Administered 2021-01-10: 1.2 ug/kg/h via INTRAVENOUS
  Administered 2021-01-10: 0.4 ug/kg/h via INTRAVENOUS
  Administered 2021-01-10: 0.9 ug/kg/h via INTRAVENOUS
  Administered 2021-01-10: 0.6 ug/kg/h via INTRAVENOUS
  Administered 2021-01-10: 0.5 ug/kg/h via INTRAVENOUS
  Administered 2021-01-10: 0.4 ug/kg/h via INTRAVENOUS
  Administered 2021-01-10: 0.9 ug/kg/h via INTRAVENOUS
  Administered 2021-01-11: 0.3 ug/kg/h via INTRAVENOUS
  Administered 2021-01-11 (×2): 0.6 ug/kg/h via INTRAVENOUS
  Filled 2021-01-07 (×21): qty 100

## 2021-01-07 MED ORDER — AMIODARONE IV BOLUS ONLY 150 MG/100ML
150.0000 mg | Freq: Once | INTRAVENOUS | Status: AC
  Administered 2021-01-07: 150 mg via INTRAVENOUS
  Filled 2021-01-07: qty 100

## 2021-01-07 MED ORDER — PERFLUTREN LIPID MICROSPHERE
1.0000 mL | INTRAVENOUS | Status: AC | PRN
  Administered 2021-01-07: 2 mL via INTRAVENOUS
  Filled 2021-01-07: qty 10

## 2021-01-07 MED ORDER — FUROSEMIDE 10 MG/ML IJ SOLN
40.0000 mg | Freq: Once | INTRAMUSCULAR | Status: AC
  Administered 2021-01-07: 40 mg via INTRAVENOUS
  Filled 2021-01-07: qty 4

## 2021-01-07 NOTE — Progress Notes (Signed)
Upon assessment, patient's IV site on left upper extremity showed signs of infiltration. Line infusing amiodarone. Site locked and drip changed to another site. Arm elevated, ice pack applied. This RN called pharmacy. Pharmacy stated that subcutaneous hyaluronidase should be given around infiltration site. Dr. [name] notified. New orders for subcutaneous hyaluronidase. IV team got a new IV access. Amiodarone changed to new site. Left upper extremity IV removed.

## 2021-01-07 NOTE — Progress Notes (Signed)
Nutrition Follow-up  DOCUMENTATION CODES:   Obesity unspecified  INTERVENTION:   Tube feeding via Cortrak tube: Pivot 1.5 at 65 ml/h (1560 ml per day)  Provides 2340 kcal, 146 gm protein, 1184 ml free water daily  200 ml free water every 8 hours  Total free water: 600 ml   NUTRITION DIAGNOSIS:   Inadequate oral intake related to inability to eat as evidenced by NPO status. Ongoing.   GOAL:   Patient will meet greater than or equal to 90% of their needs Met with TF.   MONITOR:   TF tolerance  REASON FOR ASSESSMENT:   Consult, Ventilator Enteral/tube feeding initiation and management  ASSESSMENT:   Pt with PMH of DM and HTN admitted after falling down basement stairs with TBI/SAH/SDH, significant frontal lobe injuries, occipital bone fx, temporal bone fx extending into middle ear, and R TM rupture.    Pt discussed during ICU rounds and with RN.  Pt's blood sugars remain >200. Insulin being adjusted. Spoke with trauma who will consider an insulin drip if insulin adjustments do not improve cbgs.   8/15 s/p cortrak placement; tip gastric  Patient is currently intubated on ventilator support MV: 17.2 L/min Temp (24hrs), Avg:100 F (37.8 C), Min:98.9 F (37.2 C), Max:101.5 F (38.6 C)  Propofol: 8 ml/hr provides: 211 kcal  Medications reviewed and include: colace, SSI, 10 units novolog every 4 hours, 33 units semglee BID, protonix, miralax, KCl, senna  Precedex  Fentanyl  Labs reviewed: Na: 148 CBG's: 249-288     Diet Order:   Diet Order             Diet NPO time specified  Diet effective now                   EDUCATION NEEDS:   Not appropriate for education at this time  Skin:  Skin Assessment: Reviewed RN Assessment  Last BM:  8/22  Height:   Ht Readings from Last 1 Encounters:  12/28/20 _0  (1.88 m)    Weight:   Wt Readings from Last 1 Encounters:  01/07/21 133.7 kg    Ideal Body Weight:     BMI:  Body mass index is 37.84  kg/m.  Estimated Nutritional Needs:   Kcal:  2200-2600  Protein:  135-150 grams  Fluid:  > 2 L/day  Lockie Pares., RD, LDN, CNSC See AMiON for contact information

## 2021-01-07 NOTE — Consult Note (Addendum)
Cardiology Consultation:   Patient ID: Angel [name] MRN: [id number]; DOB: [birth year]  Admit date: 12/28/2020 Date of Consult: 01/07/2021  PCP:  Merryl Hacker, No   [facility] Providers Cardiologist:  New   Patient Profile:   Angel [name] is a 70 y.o. male with a hx of hypertension, diabetes mellitus and hyperlipidemia who is being seen 01/07/2021 for the evaluation of atrial fibrillation at the request of Dr. Bobbye Morton.   History of Present Illness:   Mr. Keay brought on August 12 as a level trauma after falling down of 8 stairs.  He was intubated for airway protection.  CT of head revealed subarachnoid hemorrhage, occipital bone fracture, temporal bone fracture extending into middle ear.  Also had right tympanic membrane rupture.  No surgical intervention recommended.  Patient had temp of 102 on August 17 and started on broad-spectrum antibiotic as culture showing  strep pneumo and pseudomonas.  Patient had rhythm changes on 18th showing atrial bigeminy.  Hospital course further complicated by A. fib RVR on 19th around 3 AM. Started on amiodarone with bolus load.  Patient remained in atrial fibrillation with stable heart rate of 90s.  Cardiology is asked for further evaluation.  Temp  101 this afternoon.  Blood pressure stable   Past Medical History:  Diagnosis Date   DM (diabetes mellitus) ([place])    HLD (hyperlipidemia)    Hypertension     Inpatient Medications: Scheduled Meds:  acetaminophen  1,000 mg Per Tube Q6H   bethanechol  25 mg Per Tube TID   chlorhexidine gluconate (MEDLINE KIT)  15 mL Mouth Rinse BID   Chlorhexidine Gluconate Cloth  6 each Topical Q0600   clonazePAM  0.25 mg Per Tube BID   docusate  100 mg Per Tube BID   enoxaparin (LOVENOX) injection  40 mg Subcutaneous Q12H   free water  200 mL Per Tube Q8H   guaiFENesin  10 mL Per Tube Q4H   insulin aspart  0-15 Units Subcutaneous Q4H   insulin aspart  10 Units Subcutaneous Q4H   insulin glargine-yfgn  33 Units  Subcutaneous BID   mouth rinse  15 mL Mouth Rinse 10 times per day   methocarbamol  1,000 mg Per Tube Q8H   pantoprazole sodium  40 mg Per Tube Daily   polyethylene glycol  17 g Per Tube Daily   potassium chloride  40 mEq Per Tube BID   senna  1 tablet Per Tube Daily   Continuous Infusions:  amiodarone 60 mg/hr (01/07/21 1500)   ceFEPime (MAXIPIME) IV Stopped (01/07/21 0921)   dexmedetomidine (PRECEDEX) IV infusion 0.4 mcg/kg/hr (01/07/21 1500)   feeding supplement (PIVOT 1.5 CAL) 1,000 mL (01/06/21 0900)   fentaNYL infusion INTRAVENOUS 150 mcg/hr (01/07/21 1500)   PRN Meds: fentaNYL, labetalol, ondansetron **OR** ondansetron (ZOFRAN) IV, oxyCODONE, sodium phosphate  Allergies:   No Known Allergies  Social History:   Social History   Socioeconomic History   Marital status: Unknown    Spouse name: Not on file   Number of children: Not on file   Years of education: Not on file   Highest education level: Not on file  Occupational History   Not on file  Tobacco Use   Smoking status: Not on file   Smokeless tobacco: Not on file  Substance and Sexual Activity   Alcohol use: Not on file   Drug use: Not on file   Sexual activity: Not on file  Other Topics Concern   Not on file  Social  History Narrative   Not on file   Social Determinants of Health   Financial Resource Strain: Not on file  Food Insecurity: Not on file  Transportation Needs: Not on file  Physical Activity: Not on file  Stress: Not on file  Social Connections: Not on file  Intimate Partner Violence: Not on file    Family History:   Unable to obtain as patient is intubated  ROS:  Please see the history of present illness.  All other ROS reviewed and negative.     Physical Exam/Data:   Vitals:   01/07/21 1300 01/07/21 1400 01/07/21 1500 01/07/21 1535  BP: 120/81 120/74 121/82   Pulse: 92 95 93   Resp: (!) 21 (!) 21 17   Temp:      TempSrc:      SpO2: 96% 97% 97% 96%  Weight:      Height:         Intake/Output Summary (Last 24 hours) at 01/07/2021 1548 Last data filed at 01/07/2021 1500 Gross per 24 hour  Intake 3905.24 ml  Output 1310 ml  Net 2595.24 ml   Last 3 Weights 01/07/2021 01/06/2021 12/28/2020  Weight (lbs) 294 lb 12.1 oz 286 lb 13.1 oz 295 lb  Weight (kg) 133.7 kg 130.1 kg 133.811 kg     Body mass index is 37.84 kg/m.  General:  Obese male I, intubated  HEENT: blood in R ear externally  Lymph: no adenopathy Neck: JVD difficult to evaluate  Endocrine:  No thryomegaly Vascular: No carotid bruits; FA pulses 2+ bilaterally without bruits  Cardiac:  normal S1, S2; IR IR ; no murmur  Lungs:  clear to auscultation bilaterally, no wheezing, rhonchi or rales  Abd: soft, nontender, no hepatomegaly  Ext: trace  edema, cold extremity  Musculoskeletal:  No deformities, BUE and BLE strength normal and equal Skin: warm and dry  Neuro:  CNs 2-12 intact, no focal abnormalities noted Psych:  Normal affect   EKG:  The EKG was personally reviewed and demonstrates: Sinus rhythm, atrial bigeminy, nonspecific T wave inversion Telemetry:  Telemetry was personally reviewed and demonstrates:  AFib at rate of 90-100s  Relevant CV Studies:  Procedure:      VAS [place] LOWER EXTREMITY VENOUS (DVT)   Summary:   LEFT:  - There is no evidence of deep vein thrombosis in the lower extremity.     *See table(s) above for measurements and observations.   Laboratory Data:  High Sensitivity Troponin:  No results for input(s): TROPONINIHS in the last 720 hours.   Chemistry Recent Labs  Lab 01/05/21 0217 01/06/21 0435 01/07/21 0316 01/07/21 0442  NA 147* 145 146* 148*  K 4.4 3.7 3.9 3.8  CL 113* 110 113*  --   CO2 _0 --   GLUCOSE 305* 273* 289*  --   BUN 41* 39* 36*  --   CREATININE 0.89 0.93 0.98  --   CALCIUM 9.1 8.9 9.0  --   GFRNONAA >60 >60 >60  --   ANIONGAP _1 --     No results for input(s): PROT, ALBUMIN, AST, ALT, ALKPHOS, BILITOT in the last 168  hours. Hematology Recent Labs  Lab 01/05/21 0217 01/06/21 0435 01/07/21 0316 01/07/21 0442  WBC 10.1 7.6 12.1*  --   RBC 3.96* 3.89* 3.94*  --   HGB 10.2* 10.1* 10.0* 9.2*  HCT 33.3* 31.5* 33.1* 27.0*  MCV 84.1 81.0 84.0  --   MCH 25.8* 26.0 25.4*  --  MCHC 30.6 32.1 30.2  --   RDW 16.7* 16.6* 16.7*  --   PLT 274 PLATELET CLUMPS NOTED ON SMEAR, UNABLE TO ESTIMATE 248  --     Radiology/Studies:  DG Chest Port 1 View  Result Date: 01/07/2021 CLINICAL DATA:  Respiratory failure. EXAM: PORTABLE CHEST 1 VIEW COMPARISON:  01/02/2021 FINDINGS: Endotracheal tube is 4.1 cm above the carina. Stable enlargement of the cardiothymic silhouette. Feeding tube extends into the abdomen but the tip is beyond the image. Again noted are low lung volumes. Slightly increased densities at the right lung base. Negative for a pneumothorax. IMPRESSION: 1. Slightly increased densities at the right lung base. Findings could represent atelectasis versus airspace disease. 2. Low lung volumes. 3.  Endotracheal tube is appropriately positioned. Electronically Signed   By: Markus Daft M.D.   On: 01/07/2021 08:11   DG Abd Portable 1V  Result Date: 01/05/2021 CLINICAL DATA:  Abdominal pain. EXAM: PORTABLE ABDOMEN - 1 VIEW COMPARISON:  12/31/2020 FINDINGS: Multiple supine views. Feeding tube is looped in the stomach. Mildly prominent gas-filled colon followed to the level of the sigmoid. No gross free intraperitoneal air. No gaseous distension of small bowel loops. IMPRESSION: Mildly prominent, gas-filled colon to the level of the sigmoid. Most likely due to adynamic ileus. Distal obstruction could look similar. Feeding tube coiled in the stomach. Electronically Signed   By: Abigail Miyamoto M.D.   On: 01/05/2021 10:48   VAS [place] LOWER EXTREMITY VENOUS (DVT)  Result Date: 01/06/2021  Lower Venous DVT Study Patient Name:  DASHAUN ONSTOTT  Date of Exam:   01/06/2021 Medical Rec #: [id number]    Accession #:    [id number] Date of Birth:  1951/01/15    Patient Gender: M Patient Age:   56 years Exam Location:  [facility] Procedure:      VAS [place] LOWER EXTREMITY VENOUS (DVT) Referring Phys: Ralene Ok --------------------------------------------------------------------------------  Indications: Fever.  Limitations: Body habitus, poor ultrasound/tissue interface and patient movement (tightening and moving legs). Comparison Study: No previous exams Performing Technologist: Jody Hill RVT, RDMS  Examination Guidelines: A complete evaluation includes B-mode imaging, spectral Doppler, color Doppler, and power Doppler as needed of all accessible portions of each vessel. Bilateral testing is considered an integral part of a complete examination. Limited examinations for reoccurring indications may be performed as noted. The reflux portion of the exam is performed with the patient in reverse Trendelenburg.  +---------+---------------+---------+-----------+----------+--------------+ LEFT     CompressibilityPhasicitySpontaneityPropertiesThrombus Aging +---------+---------------+---------+-----------+----------+--------------+ CFV      Full           Yes      Yes                                 +---------+---------------+---------+-----------+----------+--------------+ SFJ      Full                                                        +---------+---------------+---------+-----------+----------+--------------+ FV Prox  Full           Yes      Yes                                 +---------+---------------+---------+-----------+----------+--------------+  FV Mid   Full           Yes      Yes                                 +---------+---------------+---------+-----------+----------+--------------+ FV DistalFull           Yes      Yes                                 +---------+---------------+---------+-----------+----------+--------------+ PFV      Full                                                         +---------+---------------+---------+-----------+----------+--------------+ POP      Full           Yes      Yes                                 +---------+---------------+---------+-----------+----------+--------------+ PTV      Full                                                        +---------+---------------+---------+-----------+----------+--------------+ PERO                                                  Not visualized +---------+---------------+---------+-----------+----------+--------------+     Summary: LEFT: - There is no evidence of deep vein thrombosis in the lower extremity.  *See table(s) above for measurements and observations. Electronically signed by Deitra Mayo MD on 01/06/2021 at 7:23:09 PM.    Final      Assessment and Plan:   New onset atrial fibrillation - Patient went into atrial fibrillation on August 19.  Heart rate remained stable at 90-100s on IV amiodarone.  Etiology is likely due to acute illness. CHA2DS2-VASc Score = 3  his indicates a 3.2% annual risk of stroke. The patient's score is based upon: CHF History: No HTN History: Yes Diabetes History: Yes Stroke History: No Vascular Disease History: No Age Score: 1 Gender Score: 0  -Likely to avoid anticoagulation given subarachnoid hemorrhage and skull fracture 2nd to 8 steps down fall.  - Will get echo  For questions or updates, please contact [place] Please consult [email or web address] for contact info under    Jarrett Soho, [place]  01/07/2021 3:48 PM   Personally seen and examined. Agree with APP above with the following comments: Briefly 71 yo M with HTN with DM, HLD who preseted after falling dow stairs with SAH and skull fracture.  On 01/04/21 had increasing ectopy that transitioned to 3 AM AF RVR.  Over weekend has been on amiodarone drip.  Has fevers with concern for infection.  No  Patient is intubated and sedated.  Day nursing notes some agitation that has  resolved with precedex. Exam  notable for IRIR heart rate, mechanical breath sounds but minimal PEEP.  Abdomen is distended with hypoactive bowel sounds and dullness to percussion. Labs notable for worsening hypernatremia and hyperglycemia Personally reviewed relevant tests; RVR has improved on telemetry since the use of amiodarone Would recommend  - Amiodarone is high risk in patient who are not-anticoagulated, given that  if he were to chemically convert he could subsequently have an embolic stroke.  In discussion with nursing, it appears that hypotension with appropriate sedation has made AV nodal blockage challenging( HD tolerability).  Will continue IV amiodarone at this time - unable to anticoagulate due to [facility] - presently [facility] is full, will get echocardiogram - may transition to AV nodal strategy based on course.  Rudean Haskell, MD [id number] [place], [place] [address] [phone]  5:44 PM

## 2021-01-07 NOTE — Progress Notes (Signed)
Trauma/Critical Care Follow Up Note  Subjective:    Overnight Issues:   Objective:  Vital signs for last 24 hours: Temp:  [99.1 F (37.3 C)-99.8 F (37.7 C)] 99.2 F (37.3 C) (08/22 0000) Pulse Rate:  [96-126] 101 (08/22 0000) Resp:  [15-25] 20 (08/22 0000) BP: (110-180)/(69-119) 133/85 (08/22 0000) SpO2:  [95 %-100 %] 95 % (08/22 0003) FiO2 (%):  [40 %] 40 % (08/22 0440) Weight:  [130.1 kg] 130.1 kg (08/21 0554)  Hemodynamic parameters for last 24 hours:    Intake/Output from previous day: 08/21 0701 - 08/22 0700 In: 2538.9 [I.V.:920.4; NG/GT:1411; IV Piggyback:207.5] Out: 1010 [Urine:950; Stool:60]  Intake/Output this shift: Total I/O In: 732.6 [I.V.:289.1; NG/GT:436; IV Piggyback:7.4] Out: 400 [Urine:400]  Vent settings for last 24 hours: Vent Mode: PRVC FiO2 (%):  [40 %] 40 % Set Rate:  [20 bmp] 20 bmp Vt Set:  [650 mL] 650 mL PEEP:  [5 cmH20-8 cmH20] 5 cmH20 Plateau Pressure:  [24 cmH20-27 cmH20] 24 cmH20  Physical Exam:  Gen: comfortable, no distress Neuro: not f/c HEENT: PERRL Neck: supple CV: AFRVR Pulm: unlabored breathing Abd: soft, NT, distended, tympanitic, +BM GU: clear yellow urine, external catheter Extr: wwp, trace edema   Results for orders placed or performed during the hospital encounter of 12/28/20 (from the past 24 hour(s))  Glucose, capillary     Status: Abnormal   Collection Time: 01/06/21  7:37 AM  Result Value Ref Range   Glucose-Capillary 254 (H) 70 - 99 mg/dL  Glucose, capillary     Status: Abnormal   Collection Time: 01/06/21 12:07 PM  Result Value Ref Range   Glucose-Capillary 253 (H) 70 - 99 mg/dL  Glucose, capillary     Status: Abnormal   Collection Time: 01/06/21  3:32 PM  Result Value Ref Range   Glucose-Capillary 208 (H) 70 - 99 mg/dL  Glucose, capillary     Status: Abnormal   Collection Time: 01/06/21  8:00 PM  Result Value Ref Range   Glucose-Capillary 218 (H) 70 - 99 mg/dL  Glucose, capillary     Status:  Abnormal   Collection Time: 01/06/21 11:46 PM  Result Value Ref Range   Glucose-Capillary 240 (H) 70 - 99 mg/dL  CBC     Status: Abnormal   Collection Time: 01/07/21  3:16 AM  Result Value Ref Range   WBC 12.1 (H) 4.0 - 10.5 K/uL   RBC 3.94 (L) 4.22 - 5.81 MIL/uL   Hemoglobin 10.0 (L) 13.0 - 17.0 g/dL   HCT 33.1 (L) 39.0 - 52.0 %   MCV 84.0 80.0 - 100.0 fL   MCH 25.4 (L) 26.0 - 34.0 pg   MCHC 30.2 30.0 - 36.0 g/dL   RDW 16.7 (H) 11.5 - 15.5 %   Platelets 248 150 - 400 K/uL   nRBC 0.2 0.0 - 0.2 %  Basic metabolic panel     Status: Abnormal   Collection Time: 01/07/21  3:16 AM  Result Value Ref Range   Sodium 146 (H) 135 - 145 mmol/L   Potassium 3.9 3.5 - 5.1 mmol/L   Chloride 113 (H) 98 - 111 mmol/L   CO2 25 22 - 32 mmol/L   Glucose, Bld 289 (H) 70 - 99 mg/dL   BUN 36 (H) 8 - 23 mg/dL   Creatinine, Ser 0.98 0.61 - 1.24 mg/dL   Calcium 9.0 8.9 - 10.3 mg/dL   GFR, Estimated >60 >60 mL/min   Anion gap 8 5 - 15  Glucose, capillary  Status: Abnormal   Collection Time: 01/07/21  4:16 AM  Result Value Ref Range   Glucose-Capillary 282 (H) 70 - 99 mg/dL  I-STAT 7, (LYTES, BLD GAS, ICA, H+H)     Status: Abnormal   Collection Time: 01/07/21  4:42 AM  Result Value Ref Range   pH, Arterial 7.421 7.350 - 7.450   pCO2 arterial 39.6 32.0 - 48.0 mmHg   pO2, Arterial 85 83.0 - 108.0 mmHg   Bicarbonate 25.5 20.0 - 28.0 mmol/L   TCO2 27 22 - 32 mmol/L   O2 Saturation 96.0 %   Acid-Base Excess 1.0 0.0 - 2.0 mmol/L   Sodium 148 (H) 135 - 145 mmol/L   Potassium 3.8 3.5 - 5.1 mmol/L   Calcium, Ion 1.33 1.15 - 1.40 mmol/L   HCT 27.0 (L) 39.0 - 52.0 %   Hemoglobin 9.2 (L) 13.0 - 17.0 g/dL   Patient temperature 99.8 F    Collection site Radial    Drawn by Operator    Sample type ARTERIAL     Assessment & Plan: The plan of care was discussed with the bedside nurse for the night, who is in agreement with this plan and no additional concerns were raised.   Present on  Admission: **None**    LOS: 10 days   Additional comments:I reviewed the patient's new clinical lab test results.   and I reviewed the patients new imaging test results.    Fall down stairs 8/12   VDRF - significant secretions, guaifenisen, improving - 40% and PEEP to 5 ID - resp CX with Strep/Pseudomonas, maxipime day 6, UA (-), BCx: pending and duplex BLE-pending TBI/SAH/SDH - NSGY c/s, Dr. Annette Stable, starting to F/C. Significant frontal lobe injuries. Keppra x7d for sz ppx Occipital bone fx - NSGY c/s, Dr. Annette Stable Temporal bone fx extending into middle ear - ENT c/s, Dr. Constance Holster Right TM Rupture - ENT c/s, Dr. Constance Holster AFRVR - amio gtt, lowered to 30 last PM in [place] this AM. Increase back to 60 and give add'l bolus this AM Hx DM2 - SSI, glargine incr to 33u BID (131u/24h) Hx HTN - PRN meds FEN - NPO, cortrak/TF, lasix x1 VTE - SCDs, LMWH  Foley - removed Dispo - ICU    Critical Care Total Time: 50 minutes  Jesusita Oka, MD Trauma & General Surgery Please use [email or web address] to contact on call provider  01/07/2021  *Care during the described time interval was provided by me. I have reviewed this patient's available data, including medical history, events of note, physical examination and test results as part of my evaluation.

## 2021-01-07 NOTE — Progress Notes (Signed)
   01/07/21 0752  Airway 8 mm  Placement Date/Time: 12/28/20 1615   Placed By: ED Physician  Airway Device: Endotracheal Tube  Laryngoscope Blade: 4  ETT Types: Oral  Size (mm): 8 mm  Cuffed: Cuffed  Insertion attempts: 1  Airway Equipment: Stylet  Placement Confirmation: Direct Vi...  Secured at (cm) 25 cm  Measured From Lips  Secured Location Right  Secured By Actuary Repositioned Yes  Prone position No  Cuff Pressure (cm H2O) Green OR 18-26 CmH2O  Site Condition Dry  Adult Ventilator Settings  Vent Type Servo i  Humidity HME  Vent Mode PRVC  Vt Set 650 mL  Set Rate 20 bmp  FiO2 (%) 40 %  I Time 0.94 Sec(s)  I:E Ratio Set 1:2.2  Pressure Support 10 cmH20  PEEP 5 cmH20  Adult Ventilator Measurements  Peak Airway Pressure 24 L/min  Mean Airway Pressure 10 cmH20  Plateau Pressure 20 cmH20  Resp Rate Spontaneous 2 br/min  Resp Rate Total 22 br/min  Exhaled Vt 595 mL  Spont TV 627 mL  Measured Ve 13.5 mL  I:E Ratio Measured 1:2.2  Auto PEEP 5 cmH20  Total PEEP 10 cmH20  SpO2 99 %  Adult Ventilator Alarms  Alarms On Y  Ve High Alarm 18 L/min  Ve Low Alarm 6 L/min  Resp Rate High Alarm 38 br/min  Resp Rate Low Alarm 17  PEEP Low Alarm 4 cmH2O  Press High Alarm 45 cmH2O  VAP Prevention  HME changed No  Ventilator changed No  Transported while on vent No  HOB> 30 Degrees Y  Equipment wiped down Yes  Daily Weaning Assessment  Daily Assessment of Readiness to Wean Wean protocol criteria met (SBT performed)  SBT Method CPAP 5 cm H20 and PS 5 cm H20  Weaning Start Time 0745  Patient response Failed SBT terminated  Reason SBT Terminated HR > 130 beats/min or sustained change in HR of 20%;Excessive agitation, marked accessory muscle use, abdominal paradox, or diaphoresis noted  Breath Sounds  Bilateral Breath Sounds Diminished  Airway Suctioning/Secretions  Suction Type ETT  Suction Device  Catheter  Secretion Amount Small  Secretion Color  White  Secretion Consistency Thin  Suction Tolerance Tolerated well  Suctioning Adverse Effects None

## 2021-01-07 NOTE — Progress Notes (Signed)
  Echocardiogram 2D Echocardiogram has been performed.  Merrie Roof F 01/07/2021, 5:11 PM

## 2021-01-08 ENCOUNTER — Inpatient Hospital Stay (HOSPITAL_COMMUNITY): Payer: PPO | Admitting: Certified Registered Nurse Anesthetist

## 2021-01-08 ENCOUNTER — Inpatient Hospital Stay (HOSPITAL_COMMUNITY): Payer: PPO

## 2021-01-08 ENCOUNTER — Encounter (HOSPITAL_COMMUNITY): Admission: EM | Disposition: A | Discharge status: Still In Hospital | Payer: Self-pay | Source: Home / Self Care

## 2021-01-08 ENCOUNTER — Inpatient Hospital Stay: Payer: Self-pay

## 2021-01-08 DIAGNOSIS — R109 Unspecified abdominal pain: Secondary | ICD-10-CM | POA: Diagnosis not present

## 2021-01-08 DIAGNOSIS — I4891 Unspecified atrial fibrillation: Secondary | ICD-10-CM

## 2021-01-08 DIAGNOSIS — R509 Fever, unspecified: Secondary | ICD-10-CM

## 2021-01-08 DIAGNOSIS — I2699 Other pulmonary embolism without acute cor pulmonale: Secondary | ICD-10-CM | POA: Diagnosis not present

## 2021-01-08 DIAGNOSIS — I480 Paroxysmal atrial fibrillation: Secondary | ICD-10-CM | POA: Diagnosis not present

## 2021-01-08 DIAGNOSIS — I152 Hypertension secondary to endocrine disorders: Secondary | ICD-10-CM | POA: Diagnosis not present

## 2021-01-08 DIAGNOSIS — E1159 Type 2 diabetes mellitus with other circulatory complications: Secondary | ICD-10-CM | POA: Diagnosis not present

## 2021-01-08 DIAGNOSIS — K8020 Calculus of gallbladder without cholecystitis without obstruction: Secondary | ICD-10-CM | POA: Diagnosis not present

## 2021-01-08 DIAGNOSIS — R579 Shock, unspecified: Secondary | ICD-10-CM | POA: Diagnosis not present

## 2021-01-08 DIAGNOSIS — I700 Atherosclerosis of aorta: Secondary | ICD-10-CM | POA: Diagnosis not present

## 2021-01-08 DIAGNOSIS — I517 Cardiomegaly: Secondary | ICD-10-CM | POA: Diagnosis not present

## 2021-01-08 DIAGNOSIS — I2694 Multiple subsegmental pulmonary emboli without acute cor pulmonale: Secondary | ICD-10-CM | POA: Diagnosis not present

## 2021-01-08 DIAGNOSIS — I4819 Other persistent atrial fibrillation: Secondary | ICD-10-CM | POA: Diagnosis not present

## 2021-01-08 DIAGNOSIS — I609 Nontraumatic subarachnoid hemorrhage, unspecified: Secondary | ICD-10-CM | POA: Diagnosis not present

## 2021-01-08 DIAGNOSIS — A419 Sepsis, unspecified organism: Secondary | ICD-10-CM | POA: Diagnosis not present

## 2021-01-08 LAB — BASIC METABOLIC PANEL
Anion gap: 8 (ref 5–15)
BUN: 48 mg/dL — ABNORMAL HIGH (ref 8–23)
CO2: 24 mmol/L (ref 22–32)
Calcium: 8.8 mg/dL — ABNORMAL LOW (ref 8.9–10.3)
Chloride: 112 mmol/L — ABNORMAL HIGH (ref 98–111)
Creatinine, Ser: 1.28 mg/dL — ABNORMAL HIGH (ref 0.61–1.24)
GFR, Estimated: >60 mL/min (ref >60)
Glucose, Bld: 239 mg/dL — ABNORMAL HIGH (ref 70–99)
Potassium: 3.9 mmol/L (ref 3.5–5.1)
Sodium: 144 mmol/L (ref 135–145)

## 2021-01-08 LAB — CBC
HCT: 31.4 % — ABNORMAL LOW (ref 39.0–52.0)
Hemoglobin: 9.5 g/dL — ABNORMAL LOW (ref 13.0–17.0)
MCH: 25.3 pg — ABNORMAL LOW (ref 26.0–34.0)
MCHC: 30.3 g/dL (ref 30.0–36.0)
MCV: 83.5 fL (ref 80.0–100.0)
Platelets: 237 10*3/uL (ref 150–400)
RBC: 3.76 MIL/uL — ABNORMAL LOW (ref 4.22–5.81)
RDW: 16.7 % — ABNORMAL HIGH (ref 11.5–15.5)
WBC: 13.3 10*3/uL — ABNORMAL HIGH (ref 4.0–10.5)
nRBC: 0.2 % (ref 0.0–0.2)

## 2021-01-08 LAB — GLUCOSE, CAPILLARY
Glucose-Capillary: 142 mg/dL — ABNORMAL HIGH (ref 70–99)
Glucose-Capillary: 145 mg/dL — ABNORMAL HIGH (ref 70–99)
Glucose-Capillary: 163 mg/dL — ABNORMAL HIGH (ref 70–99)
Glucose-Capillary: 204 mg/dL — ABNORMAL HIGH (ref 70–99)
Glucose-Capillary: 208 mg/dL — ABNORMAL HIGH (ref 70–99)
Glucose-Capillary: 74 mg/dL (ref 70–99)

## 2021-01-08 LAB — SURGICAL PCR SCREEN
MRSA, PCR: NEGATIVE
Staphylococcus aureus: NEGATIVE

## 2021-01-08 LAB — APTT: aPTT: 57 seconds — ABNORMAL HIGH (ref 24–36)

## 2021-01-08 SURGERY — CREATION, TRACHEOSTOMY
Anesthesia: General

## 2021-01-08 MED ORDER — ESMOLOL HCL 100 MG/10ML IV SOLN
INTRAVENOUS | Status: DC | PRN
  Administered 2021-01-08: 20 mg via INTRAVENOUS
  Administered 2021-01-08: 30 mg via INTRAVENOUS

## 2021-01-08 MED ORDER — FUROSEMIDE 10 MG/ML IJ SOLN: 40.0000 mg | Freq: Once | INTRAMUSCULAR | Status: DC

## 2021-01-08 MED ORDER — LACTATED RINGERS IV SOLN: INTRAVENOUS | Status: DC | PRN

## 2021-01-08 MED ORDER — INSULIN ASPART 100 UNIT/ML IJ SOLN
0.0000 [IU] | INTRAMUSCULAR | Status: DC
  Administered 2021-01-08 (×2): 3 [IU] via SUBCUTANEOUS
  Administered 2021-01-09 – 2021-01-10 (×7): 7 [IU] via SUBCUTANEOUS
  Administered 2021-01-10: 17 [IU] via SUBCUTANEOUS
  Administered 2021-01-10: 7 [IU] via SUBCUTANEOUS
  Administered 2021-01-10: 4 [IU] via SUBCUTANEOUS
  Administered 2021-01-10: 11 [IU] via SUBCUTANEOUS
  Administered 2021-01-11: 4 [IU] via SUBCUTANEOUS
  Administered 2021-01-15: 3 [IU] via SUBCUTANEOUS
  Administered 2021-01-15 (×3): 4 [IU] via SUBCUTANEOUS
  Administered 2021-01-15: 11 [IU] via SUBCUTANEOUS
  Administered 2021-01-16: 7 [IU] via SUBCUTANEOUS
  Administered 2021-01-16: 11 [IU] via SUBCUTANEOUS
  Administered 2021-01-16: 7 [IU] via SUBCUTANEOUS
  Administered 2021-01-16 (×2): 11 [IU] via SUBCUTANEOUS
  Administered 2021-01-17 (×2): 4 [IU] via SUBCUTANEOUS
  Administered 2021-01-17: 7 [IU] via SUBCUTANEOUS
  Administered 2021-01-17 (×3): 4 [IU] via SUBCUTANEOUS
  Administered 2021-01-18 (×2): 7 [IU] via SUBCUTANEOUS
  Administered 2021-01-18: 4 [IU] via SUBCUTANEOUS
  Administered 2021-01-18: 7 [IU] via SUBCUTANEOUS
  Administered 2021-01-18 – 2021-01-19 (×2): 4 [IU] via SUBCUTANEOUS
  Administered 2021-01-19: 3 [IU] via SUBCUTANEOUS
  Administered 2021-01-19: 4 [IU] via SUBCUTANEOUS
  Administered 2021-01-19 (×2): 3 [IU] via SUBCUTANEOUS
  Administered 2021-01-20: 4 [IU] via SUBCUTANEOUS
  Administered 2021-01-20: 3 [IU] via SUBCUTANEOUS
  Administered 2021-01-20 (×2): 4 [IU] via SUBCUTANEOUS
  Administered 2021-01-20: 3 [IU] via SUBCUTANEOUS
  Administered 2021-01-20: 4 [IU] via SUBCUTANEOUS
  Administered 2021-01-21 (×2): 3 [IU] via SUBCUTANEOUS
  Administered 2021-01-21 (×4): 4 [IU] via SUBCUTANEOUS
  Administered 2021-01-21 – 2021-01-22 (×2): 3 [IU] via SUBCUTANEOUS
  Administered 2021-01-22 (×2): 4 [IU] via SUBCUTANEOUS
  Administered 2021-01-22 – 2021-01-23 (×3): 3 [IU] via SUBCUTANEOUS
  Administered 2021-01-24 (×2): 4 [IU] via SUBCUTANEOUS
  Administered 2021-01-24: 3 [IU] via SUBCUTANEOUS
  Administered 2021-01-25: 4 [IU] via SUBCUTANEOUS
  Administered 2021-01-25: 7 [IU] via SUBCUTANEOUS
  Administered 2021-01-25: 4 [IU] via SUBCUTANEOUS
  Administered 2021-01-25 (×2): 7 [IU] via SUBCUTANEOUS
  Administered 2021-01-25 – 2021-01-26 (×3): 4 [IU] via SUBCUTANEOUS
  Administered 2021-01-26: 3 [IU] via SUBCUTANEOUS
  Administered 2021-01-26: 4 [IU] via SUBCUTANEOUS
  Administered 2021-01-26 (×2): 7 [IU] via SUBCUTANEOUS
  Administered 2021-01-27: 4 [IU] via SUBCUTANEOUS
  Administered 2021-01-27: 3 [IU] via SUBCUTANEOUS
  Administered 2021-01-27: 7 [IU] via SUBCUTANEOUS
  Administered 2021-01-27 (×2): 3 [IU] via SUBCUTANEOUS
  Administered 2021-01-28: 4 [IU] via SUBCUTANEOUS
  Administered 2021-01-28: 3 [IU] via SUBCUTANEOUS
  Administered 2021-01-28: 4 [IU] via SUBCUTANEOUS
  Administered 2021-01-28: 3 [IU] via SUBCUTANEOUS
  Administered 2021-01-28: 4 [IU] via SUBCUTANEOUS
  Administered 2021-01-28 – 2021-01-29 (×3): 7 [IU] via SUBCUTANEOUS
  Administered 2021-01-29 – 2021-01-30 (×6): 4 [IU] via SUBCUTANEOUS
  Administered 2021-01-30 (×2): 7 [IU] via SUBCUTANEOUS
  Administered 2021-01-30: 4 [IU] via SUBCUTANEOUS
  Administered 2021-01-30: 7 [IU] via SUBCUTANEOUS
  Administered 2021-01-31: 3 [IU] via SUBCUTANEOUS
  Administered 2021-01-31 (×2): 7 [IU] via SUBCUTANEOUS
  Administered 2021-01-31: 3 [IU] via SUBCUTANEOUS
  Administered 2021-01-31: 4 [IU] via SUBCUTANEOUS
  Administered 2021-01-31: 3 [IU] via SUBCUTANEOUS
  Administered 2021-01-31: 4 [IU] via SUBCUTANEOUS
  Administered 2021-02-01: 3 [IU] via SUBCUTANEOUS
  Administered 2021-02-01: 4 [IU] via SUBCUTANEOUS
  Administered 2021-02-01 – 2021-02-02 (×3): 3 [IU] via SUBCUTANEOUS
  Administered 2021-02-02 (×2): 4 [IU] via SUBCUTANEOUS
  Administered 2021-02-02: 3 [IU] via SUBCUTANEOUS
  Administered 2021-02-03: 4 [IU] via SUBCUTANEOUS
  Administered 2021-02-03 – 2021-02-05 (×6): 3 [IU] via SUBCUTANEOUS
  Administered 2021-02-05: 4 [IU] via SUBCUTANEOUS
  Administered 2021-02-06 – 2021-02-07 (×3): 3 [IU] via SUBCUTANEOUS
  Administered 2021-02-07: 4 [IU] via SUBCUTANEOUS
  Administered 2021-02-07: 3 [IU] via SUBCUTANEOUS
  Administered 2021-02-07: 4 [IU] via SUBCUTANEOUS
  Administered 2021-02-07: 3 [IU] via SUBCUTANEOUS
  Administered 2021-02-08 (×2): 4 [IU] via SUBCUTANEOUS
  Administered 2021-02-09: 3 [IU] via SUBCUTANEOUS
  Administered 2021-02-09: 4 [IU] via SUBCUTANEOUS
  Administered 2021-02-09 (×2): 3 [IU] via SUBCUTANEOUS
  Administered 2021-02-09: 4 [IU] via SUBCUTANEOUS
  Administered 2021-02-10 (×2): 3 [IU] via SUBCUTANEOUS
  Administered 2021-02-10 (×2): 4 [IU] via SUBCUTANEOUS
  Administered 2021-02-10: 3 [IU] via SUBCUTANEOUS
  Administered 2021-02-11: 4 [IU] via SUBCUTANEOUS
  Administered 2021-02-11: 3 [IU] via SUBCUTANEOUS
  Administered 2021-02-11 – 2021-02-12 (×2): 4 [IU] via SUBCUTANEOUS
  Administered 2021-02-12 – 2021-02-13 (×4): 3 [IU] via SUBCUTANEOUS

## 2021-01-08 MED ORDER — MIDAZOLAM HCL 2 MG/2ML IJ SOLN
INTRAMUSCULAR | Status: DC | PRN
  Administered 2021-01-08: 2 mg via INTRAVENOUS

## 2021-01-08 MED ORDER — SODIUM CHLORIDE 0.9 % IV SOLN
0.0500 mg/kg/h | INTRAVENOUS | Status: AC
  Administered 2021-01-08: 0.05 mg/kg/h via INTRAVENOUS
  Filled 2021-01-08 (×3): qty 250

## 2021-01-08 MED ORDER — HEPARIN (PORCINE) 25000 UT/250ML-% IV SOLN
1500.0000 [IU]/h | INTRAVENOUS | Status: DC
  Filled 2021-01-08: qty 250

## 2021-01-08 MED ORDER — PHENYLEPHRINE HCL-NACL 20-0.9 MG/250ML-% IV SOLN: 0.0000 ug/min | INTRAVENOUS | Status: DC

## 2021-01-08 MED ORDER — MIDAZOLAM HCL 2 MG/2ML IJ SOLN
INTRAMUSCULAR | Status: AC
  Filled 2021-01-08: qty 2

## 2021-01-08 MED ORDER — MUPIROCIN 2 % EX OINT
1.0000 "application " | TOPICAL_OINTMENT | Freq: Two times a day (BID) | CUTANEOUS | Status: AC
  Administered 2021-01-08 – 2021-01-12 (×10): 1 via NASAL
  Filled 2021-01-08 (×2): qty 22

## 2021-01-08 MED ORDER — IOHEXOL 350 MG/ML SOLN
100.0000 mL | Freq: Once | INTRAVENOUS | Status: AC | PRN
  Administered 2021-01-08: 100 mL via INTRAVENOUS

## 2021-01-08 MED ORDER — INSULIN GLARGINE-YFGN 100 UNIT/ML ~~LOC~~ SOLN
43.0000 [IU] | Freq: Two times a day (BID) | SUBCUTANEOUS | Status: DC
  Administered 2021-01-08 – 2021-01-18 (×12): 43 [IU] via SUBCUTANEOUS
  Filled 2021-01-08 (×22): qty 0.43

## 2021-01-08 MED ORDER — INSULIN GLARGINE-YFGN 100 UNIT/ML ~~LOC~~ SOLN
10.0000 [IU] | Freq: Once | SUBCUTANEOUS | Status: AC
  Administered 2021-01-08: 10 [IU] via SUBCUTANEOUS
  Filled 2021-01-08: qty 0.1

## 2021-01-08 MED ORDER — LABETALOL HCL 5 MG/ML IV SOLN
10.0000 mg | INTRAVENOUS | Status: DC | PRN
  Administered 2021-01-10: 20 mg via INTRAVENOUS
  Administered 2021-01-11: 10 mg via INTRAVENOUS
  Filled 2021-01-08 (×2): qty 4

## 2021-01-08 MED ORDER — IOHEXOL 350 MG/ML SOLN
50.0000 mL | Freq: Once | INTRAVENOUS | Status: AC | PRN
  Administered 2021-01-08: 50 mL via INTRAVENOUS

## 2021-01-08 MED ORDER — ESMOLOL HCL 100 MG/10ML IV SOLN
INTRAVENOUS | Status: AC
  Filled 2021-01-08: qty 10

## 2021-01-08 MED ORDER — FENTANYL CITRATE (PF) 250 MCG/5ML IJ SOLN
INTRAMUSCULAR | Status: AC
  Filled 2021-01-08: qty 5

## 2021-01-08 MED ORDER — METRONIDAZOLE 500 MG/100ML IV SOLN
500.0000 mg | Freq: Three times a day (TID) | INTRAVENOUS | Status: DC
  Administered 2021-01-08 – 2021-01-10 (×7): 500 mg via INTRAVENOUS
  Filled 2021-01-08 (×7): qty 100

## 2021-01-08 MED ORDER — FUROSEMIDE 10 MG/ML IJ SOLN
60.0000 mg | Freq: Once | INTRAMUSCULAR | Status: AC
  Administered 2021-01-08: 60 mg via INTRAVENOUS
  Filled 2021-01-08: qty 6

## 2021-01-08 MED ORDER — PHENYLEPHRINE CONCENTRATED 100MG/250ML (0.4 MG/ML) INFUSION SIMPLE: 0.0000 ug/min | INTRAVENOUS | Status: DC

## 2021-01-08 MED ORDER — ROCURONIUM BROMIDE 10 MG/ML (PF) SYRINGE
PREFILLED_SYRINGE | INTRAVENOUS | Status: DC | PRN
  Administered 2021-01-08 (×2): 50 mg via INTRAVENOUS

## 2021-01-08 SURGICAL SUPPLY — 20 items
BAG COUNTER SPONGE SURGICOUNT (BAG) IMPLANT
COVER SURGICAL LIGHT HANDLE (MISCELLANEOUS) IMPLANT
DRAPE UTILITY XL STRL (DRAPES) IMPLANT
ELECT CAUTERY BLADE 6.4 (BLADE) IMPLANT
GLOVE SURG ENC MOIS LTX SZ6.5 (GLOVE) IMPLANT
GLOVE SURG UNDER POLY LF SZ6 (GLOVE) IMPLANT
GOWN STRL REUS W/ TWL LRG LVL3 (GOWN DISPOSABLE) IMPLANT
GOWN STRL REUS W/TWL LRG LVL3 (GOWN DISPOSABLE)
KIT BASIN OR (CUSTOM PROCEDURE TRAY) IMPLANT
KIT TURNOVER KIT B (KITS) ×2 IMPLANT
PACK EENT II TURBAN DRAPE (CUSTOM PROCEDURE TRAY) IMPLANT
PENCIL BUTTON HOLSTER BLD 10FT (ELECTRODE) IMPLANT
SPONGE INTESTINAL PEANUT (DISPOSABLE) IMPLANT
SUT SILK 2 0 SH (SUTURE) IMPLANT
SUT VIC AB 2-0 SH 27 (SUTURE)
SUT VIC AB 2-0 SH 27XBRD (SUTURE) IMPLANT
SYR 20ML LL LF (SYRINGE) IMPLANT
TOWEL GREEN STERILE (TOWEL DISPOSABLE) IMPLANT
TOWEL GREEN STERILE FF (TOWEL DISPOSABLE) IMPLANT
TUBE CONNECTING 12X1/4 (SUCTIONS) IMPLANT

## 2021-01-08 NOTE — Transfer of Care (Signed)
Immediate Anesthesia Transfer of Care Note  Patient: Angel [name]  Procedure(s) Performed: Cancelled case  Patient Location: ICU  Anesthesia Type:General  Level of Consciousness: Patient remains intubated per anesthesia plan  Airway & Oxygen Therapy: Patient remains intubated per anesthesia plan and Patient placed on Ventilator (see vital sign flow sheet for setting)  Post-op Assessment: Report given to RN and Post -op Vital signs reviewed and stable  Post vital signs: Reviewed and stable  Last Vitals:  Vitals Value Taken Time  BP    Temp    Pulse    Resp    SpO2      Last Pain:  Vitals:   01/08/21 0400  TempSrc: Axillary  PainSc:          Complications: No notable events documented.

## 2021-01-08 NOTE — Anesthesia Preprocedure Evaluation (Signed)
Anesthesia Evaluation  Patient identified by MRN, date of birth, ID band Patient unresponsive    Reviewed: Allergy & Precautions, H&P , NPO status , Patient's Chart, lab work & pertinent test results  Airway Mallampati: Trach       Dental   Pulmonary  Intubated for respiratory failure   breath sounds clear to auscultation       Cardiovascular hypertension, + dysrhythmias Atrial Fibrillation  Rhythm:irregular Rate:Normal     Neuro/Psych S/p fall down stairs 8/12.  TBI/SAH. Cranial fx    GI/Hepatic   Endo/Other  diabetes, Type 2  Renal/GU      Musculoskeletal   Abdominal   Peds  Hematology  (+) anemia ,   Anesthesia Other Findings   Reproductive/Obstetrics                             Anesthesia Physical Anesthesia Plan  ASA: 4  Anesthesia Plan: General   Post-op Pain Management:    Induction: Intravenous  PONV Risk Score and Plan: 2 and Ondansetron, Dexamethasone and Treatment may vary due to age or medical condition  Airway Management Planned: Oral ETT  Additional Equipment:   Intra-op Plan:   Post-operative Plan: Post-operative intubation/ventilation  Informed Consent: I have reviewed the patients History and Physical, chart, labs and discussed the procedure including the risks, benefits and alternatives for the proposed anesthesia with the patient or authorized representative who has indicated his/her understanding and acceptance.       Plan Discussed with: CRNA, Anesthesiologist and Surgeon  Anesthesia Plan Comments:         Anesthesia Quick Evaluation

## 2021-01-08 NOTE — Progress Notes (Signed)
Called by MD in the OR to meet them in CT with a vent. Met them & placed patient on vent for CT scan then transported back to room 4N26 on the vent with no problems.

## 2021-01-08 NOTE — Progress Notes (Addendum)
Progress Note  Patient Name: Angel [name] Date of Encounter: 01/08/2021  [facility] Cardiologist: Werner Lean, MD new to [place]   Pt remains intubated, tracks with eyes  Inpatient Medications    Scheduled Meds:  acetaminophen  1,000 mg Per Tube Q6H   bethanechol  25 mg Per Tube TID   chlorhexidine gluconate (MEDLINE KIT)  15 mL Mouth Rinse BID   Chlorhexidine Gluconate Cloth  6 each Topical Q0600   clonazePAM  0.25 mg Per Tube BID   docusate  100 mg Per Tube BID   enoxaparin (LOVENOX) injection  40 mg Subcutaneous Q12H   free water  200 mL Per Tube Q8H   guaiFENesin  10 mL Per Tube Q4H   insulin aspart  0-15 Units Subcutaneous Q4H   insulin aspart  10 Units Subcutaneous Q4H   insulin glargine-yfgn  33 Units Subcutaneous BID   mouth rinse  15 mL Mouth Rinse 10 times per day   methocarbamol  1,000 mg Per Tube Q8H   mupirocin ointment  1 application Nasal BID   pantoprazole sodium  40 mg Per Tube Daily   polyethylene glycol  17 g Per Tube Daily   potassium chloride  40 mEq Per Tube BID   senna  1 tablet Per Tube Daily   Continuous Infusions:  amiodarone 60 mg/hr (01/08/21 0756)   ceFEPime (MAXIPIME) IV Stopped (01/08/21 0053)   dexmedetomidine (PRECEDEX) IV infusion 0.4 mcg/kg/hr (01/08/21 0756)   feeding supplement (PIVOT 1.5 CAL) Stopped (01/07/21 2359)   fentaNYL infusion INTRAVENOUS 150 mcg/hr (01/08/21 0756)   PRN Meds: fentaNYL, labetalol, ondansetron **OR** ondansetron (ZOFRAN) IV, oxyCODONE, sodium phosphate   Vital Signs    Vitals:   01/08/21 0756 01/08/21 0800 01/08/21 0900 01/08/21 0910  BP:  102/71 102/67   Pulse: (!) 103 91 93   Resp: 19 20 20    Temp:      TempSrc:      SpO2: 97% 96% 97% 99%  Weight:      Height:        Intake/Output Summary (Last 24 hours) at 01/08/2021 0929 Last data filed at 01/08/2021 0756 Gross per 24 hour  Intake 3206.34 ml  Output 1690 ml  Net 1516.34 ml   Last 3 Weights 01/08/2021  01/07/2021 01/06/2021  Weight (lbs) 287 lb 11.2 oz 294 lb 12.1 oz 286 lb 13.1 oz  Weight (kg) 130.5 kg 133.7 kg 130.1 kg      Telemetry    Course Afib vs flutter in the 90s - Personally Reviewed  ECG    No new tracings - Personally Reviewed  Physical Exam   GEN: intubated Neck: No JVD, exam difficult given body habitus Cardiac: irregular rhythm, regular rate, no murmur Respiratory: intubated, lung sounds difficult GI: Soft, nontender will dullness to percusion MS: mild B LE edema; No deformity. Neuro:  Nonfocal  Psych: Normal affect   Labs    High Sensitivity Troponin:  No results for input(s): TROPONINIHS in the last 720 hours.    Chemistry Recent Labs  Lab 01/06/21 0435 01/07/21 0316 01/07/21 0442 01/08/21 0301  NA 145 146* 148* 144  K 3.7 3.9 3.8 3.9  CL 110 113*  --  112*  CO2 24 25  --  24  GLUCOSE 273* 289*  --  239*  BUN 39* 36*  --  48*  CREATININE 0.93 0.98  --  1.28*  CALCIUM 8.9 9.0  --  8.8*  GFRNONAA >60 >60  --  >  [place] 8  --  8     Hematology Recent Labs  Lab 01/06/21 0435 01/07/21 0316 01/07/21 0442 01/08/21 0301  WBC 7.6 12.1*  --  13.3*  RBC 3.89* 3.94*  --  3.76*  HGB 10.1* 10.0* 9.2* 9.5*  HCT 31.5* 33.1* 27.0* 31.4*  MCV 81.0 84.0  --  83.5  MCH 26.0 25.4*  --  25.3*  MCHC 32.1 30.2  --  30.3  RDW 16.6* 16.7*  --  16.7*  PLT PLATELET CLUMPS NOTED ON SMEAR, UNABLE TO ESTIMATE 248  --  237    BNPNo results for input(s): BNP, PROBNP in the last 168 hours.   DDimer No results for input(s): DDIMER in the last 168 hours.   Radiology    DG Chest Port 1 View  Result Date: 01/07/2021 CLINICAL DATA:  Respiratory failure. EXAM: PORTABLE CHEST 1 VIEW COMPARISON:  01/02/2021 FINDINGS: Endotracheal tube is 4.1 cm above the carina. Stable enlargement of the cardiothymic silhouette. Feeding tube extends into the abdomen but the tip is beyond the image. Again noted are low lung volumes. Slightly increased densities at the right lung  base. Negative for a pneumothorax. IMPRESSION: 1. Slightly increased densities at the right lung base. Findings could represent atelectasis versus airspace disease. 2. Low lung volumes. 3.  Endotracheal tube is appropriately positioned. Electronically Signed   By: Markus Daft M.D.   On: 01/07/2021 08:11   ECHOCARDIOGRAM COMPLETE  Result Date: 01/07/2021    ECHOCARDIOGRAM REPORT   Patient Name:   Angel [name] Date of Exam: 01/07/2021 Medical Rec #:  [id number]   Height:       74.0 in Accession #:    [id number]  Weight:       294.8 lb Date of Birth:  06-Oct-1950   BSA:          2.563 m Patient Age:    70 years    BP:           137/77 mmHg Patient Gender: M           HR:           111 bpm. Exam Location:  Inpatient Procedure: 2D Echo, Cardiac Doppler, Color Doppler, 3D Echo and Intracardiac            Opacification Agent Indications:    Atrial Fibrillation  History:        Patient has no prior history of Echocardiogram examinations.                 Patient fell from stairs and cracked his skull.  Sonographer:    Merrie Roof RDCS Referring Phys: [id number] [facility]  Sonographer Comments: Suboptimal apical window, no subcostal window, patient is morbidly obese and echo performed with patient supine and on artificial respirator. Image acquisition challenging due to patient body habitus. IMPRESSIONS  1. Left ventricular ejection fraction, by estimation, is 60 to 65%. The left ventricle has normal function. The left ventricle has no regional wall motion abnormalities. There is mild left ventricular hypertrophy. Left ventricular diastolic parameters are indeterminate.  2. Right ventricule is poorly visualized but grossly normal size and systolic function  3. Left atrial size was mildly dilated.  4. Right atrial size was mildly dilated.  5. The mitral valve is normal in structure. No evidence of mitral valve regurgitation. No evidence of mitral stenosis.  6. The aortic valve was not well visualized. Aortic valve  regurgitation is not visualized. No aortic stenosis is  present. FINDINGS  Left Ventricle: Left ventricular ejection fraction, by estimation, is 60 to 65%. The left ventricle has normal function. The left ventricle has no regional wall motion abnormalities. Definity contrast agent was given IV to delineate the left ventricular  endocardial borders. The left ventricular internal cavity size was normal in size. There is mild left ventricular hypertrophy. Left ventricular diastolic parameters are indeterminate. Right Ventricle: The right ventricular size is not well visualized. Right vetricular wall thickness was not well visualized. Right ventricular systolic function was not well visualized. Left Atrium: Left atrial size was mildly dilated. Right Atrium: Right atrial size was mildly dilated. Pericardium: There is no evidence of pericardial effusion. Presence of pericardial fat pad. Mitral Valve: The mitral valve is normal in structure. No evidence of mitral valve regurgitation. No evidence of mitral valve stenosis. Tricuspid Valve: The tricuspid valve is normal in structure. Tricuspid valve regurgitation is trivial. Aortic Valve: The aortic valve was not well visualized. Aortic valve regurgitation is not visualized. No aortic stenosis is present. Aortic valve mean gradient measures 5.0 mmHg. Aortic valve peak gradient measures 9.1 mmHg. Aortic valve area, by VTI measures 2.97 cm. Pulmonic Valve: The pulmonic valve was not well visualized. Pulmonic valve regurgitation is not visualized. Aorta: The aortic root is normal in size and structure. IAS/Shunts: The interatrial septum was not well visualized.  LEFT VENTRICLE PLAX 2D LVIDd:         4.35 cm LVIDs:         3.20 cm LV PW:         1.20 cm LV IVS:        1.25 cm LVOT diam:     2.00 cm LV SV:         53 LV SV Index:   21 LVOT Area:     3.14 cm  RIGHT VENTRICLE RV Basal diam:  3.20 cm LEFT ATRIUM            Index       RIGHT ATRIUM           Index LA diam:      4.25  cm  1.66 cm/m  RA Area:     23.60 cm LA Vol (A4C): 104.0 ml 40.58 ml/m RA Volume:   80.30 ml  31.33 ml/m  AORTIC VALVE AV Area (Vmax):    2.68 cm AV Area (Vmean):   2.33 cm AV Area (VTI):     2.97 cm AV Vmax:           151.00 cm/s AV Vmean:          111.000 cm/s AV VTI:            0.179 m AV Peak Grad:      9.1 mmHg AV Mean Grad:      5.0 mmHg LVOT Vmax:         129.00 cm/s LVOT Vmean:        82.200 cm/s LVOT VTI:          0.169 m LVOT/AV VTI ratio: 0.94  AORTA Ao Root diam: 3.90 cm  SHUNTS Systemic VTI:  0.17 m Systemic Diam: 2.00 cm Oswaldo Milian MD Electronically signed by Oswaldo Milian MD Signature Date/Time: 01/07/2021/7:04:11 PM    Final    VAS [place] LOWER EXTREMITY VENOUS (DVT)  Result Date: 01/06/2021  Lower Venous DVT Study Patient Name:  FREDRIK MOGEL  Date of Exam:   01/06/2021 Medical Rec #: [id number]    Accession #:  [id number] Date of Birth: [birth year]    Patient Gender: M Patient Age:   60 years Exam [facility]  [facility] Procedure:      VAS [place] LOWER EXTREMITY VENOUS (DVT) Referring Phys: Ralene Ok --------------------------------------------------------------------------------  Indications: Fever.  Limitations: Body habitus, poor ultrasound/tissue interface and patient movement (tightening and moving legs). Comparison Study: No previous exams Performing Technologist: Jody Hill RVT, RDMS  Examination Guidelines: A complete evaluation includes B-mode imaging, spectral Doppler, color Doppler, and power Doppler as needed of all accessible portions of each vessel. Bilateral testing is considered an integral part of a complete examination. Limited examinations for reoccurring indications may be performed as noted. The reflux portion of the exam is performed with the patient in reverse Trendelenburg.  +---------+---------------+---------+-----------+----------+--------------+ LEFT     CompressibilityPhasicitySpontaneityPropertiesThrombus Aging  +---------+---------------+---------+-----------+----------+--------------+ CFV      Full           Yes      Yes                                 +---------+---------------+---------+-----------+----------+--------------+ SFJ      Full                                                        +---------+---------------+---------+-----------+----------+--------------+ FV Prox  Full           Yes      Yes                                 +---------+---------------+---------+-----------+----------+--------------+ FV Mid   Full           Yes      Yes                                 +---------+---------------+---------+-----------+----------+--------------+ FV DistalFull           Yes      Yes                                 +---------+---------------+---------+-----------+----------+--------------+ PFV      Full                                                        +---------+---------------+---------+-----------+----------+--------------+ POP      Full           Yes      Yes                                 +---------+---------------+---------+-----------+----------+--------------+ PTV      Full                                                        +---------+---------------+---------+-----------+----------+--------------+  PERO                                                  Not visualized +---------+---------------+---------+-----------+----------+--------------+     Summary: LEFT: - There is no evidence of deep vein thrombosis in the lower extremity.  *See table(s) above for measurements and observations. Electronically signed by Deitra Mayo MD on 01/06/2021 at 7:23:09 PM.    Final     Cardiac Studies   Echo 01/07/21: 1. Left ventricular ejection fraction, by estimation, is 60 to 65%. The  left ventricle has normal function. The left ventricle has no regional  wall motion abnormalities. There is mild left ventricular hypertrophy.  Left  ventricular diastolic parameters  are indeterminate.   2. Right ventricule is poorly visualized but grossly normal size and  systolic function   3. Left atrial size was mildly dilated.   4. Right atrial size was mildly dilated.   5. The mitral valve is normal in structure. No evidence of mitral valve  regurgitation. No evidence of mitral stenosis.   6. The aortic valve was not well visualized. Aortic valve regurgitation  is not visualized. No aortic stenosis is present.  Patient Profile     70 y.o. male with a hx of hypertension, diabetes mellitus and hyperlipidemia who is being seen 01/07/2021 for the evaluation of atrial fibrillation.  Pt hospitalized after falling down 8 stairs and suffering SAH and occipital and temporal bone fractures. Afib noted 01/04/21 and started on amiodarone.  Assessment & Plan    Afib RVR - rate control with AV nodal agents was initially limited by blood pressure - pt was started on amiodarone with improved rate control today - will continue this for now - spoke with Dr. Bobbye Morton and he CAN be anticoagulated with heparin, no bolus, starting tonight after trach and peg placement   SAH - per neurosurgery   Respiratory infection - respiratory cultures grew strep/pseudomonas - ABX per primary     For questions or updates, please contact [facility] Please consult [email or web address] for contact info under        Signed, Ledora Bottcher, PA  01/08/2021, 9:29 AM    Personally seen and examined. Agree with APP above with the following comments: Briefly 70 yo M with SAH and new AF with  Patient is still intubated and sedated.   Exam notable for irregular tachycardia and hypoactive bowel sounds with dullness to percussion. Labs notable for stable hgb, improved NA, slight increase in creatinine.   Personally reviewed relevant tests; Echo shows no evidence of LV systolic dysfunction, mild biatrial enlargement. Would recommend  - discussed with primary  trauma MD; risk from amiodarone comes from potential chemical cardioversion and subsequent embolic stroke risk.  We may be able to mitigate this with the use of anticoagulation. - post surgery today, will plan on heparin no bolus for treatment of AF.  Discussed with Dr. Bobbye Morton, documentation for Dr. Annette Stable (neurosurgery) is in agreement.  Will continue to follow.  Rudean Haskell, MD [address], [address], [place] [address] [phone]  11:53 AM

## 2021-01-08 NOTE — Anesthesia Postprocedure Evaluation (Signed)
Anesthesia Post Note  Patient: LUQMAN PERRELLI  Procedure(s) Performed: Cancelled case     Patient location during evaluation: Radiology Anesthesia Type: General Level of consciousness: patient remains intubated per anesthesia plan and obtunded/minimal responses Pain management: pain level controlled Vital Signs Assessment: post-procedure vital signs reviewed and stable Respiratory status: patient on ventilator - see flowsheet for VS and patient remains intubated per anesthesia plan Cardiovascular status: tachycardic and blood pressure returned to baseline Postop Assessment: no apparent nausea or vomiting Anesthetic complications: no Comments: Case cancelled by Dr Bobbye Morton, transported to CT and report given to Neuro ICU RN   No notable events documented.  Last Vitals:  Vitals:   01/08/21 1200 01/08/21 1212  BP:    Pulse: (!) 116   Resp: (!) 24   Temp:    SpO2: 100% 96%    Last Pain:  Vitals:   01/08/21 0400  TempSrc: Axillary  PainSc:                  Trenee Igoe,E. [place]

## 2021-01-08 NOTE — Progress Notes (Signed)
Patient transported to CT & back on the ventilator with no problems.

## 2021-01-08 NOTE — Progress Notes (Addendum)
ANTICOAGULATION CONSULT NOTE - Initial Consult  Pharmacy Consult for bivalirudin Indication: atrial fibrillation, DVT, PE  No Known Allergies  Patient Measurements: Height: 6\' 2"  (188 cm) Weight: 130.5 kg (287 lb 11.2 oz) IBW/kg (Calculated) : 82.2 Heparin Dosing Weight: 107kg  Vital Signs: BP: 138/92 (08/23 1100) Pulse Rate: 116 (08/23 1200)  Labs: Recent Labs    01/06/21 0435 01/07/21 0316 01/07/21 0442 01/08/21 0301  HGB 10.1* 10.0* 9.2* 9.5*  HCT 31.5* 33.1* 27.0* 31.4*  PLT PLATELET CLUMPS NOTED ON SMEAR, UNABLE TO ESTIMATE 248  --  237  CREATININE 0.93 0.98  --  1.28*     Estimated Creatinine Clearance: 77.1 mL/min (A) (by C-G formula based on SCr of 1.28 mg/dL (H)).   Medical History: Past Medical History:  Diagnosis Date   DM (diabetes mellitus) ([place])    HLD (hyperlipidemia)    Hypertension     Assessment: 51 YOM presenting s/p fall with TBI/SAH and facial fx, in afib started on amiodarone and now cleared per trauma for anticoagulation to start heparin gtt.  H/H low but stable, plts wnl.  Last dose of dvt ppx lovenox 8/22.  Orders for PEG and trach cancelled, patient taken urgently to CT. Orders changed from heparin to bivalirudin after CT angio was positive for PE. Given critical status and recent head injury will watch dosing and signs and symptoms of bleeding closely.   Will aim for middle of therapeutic range aptt of ~65s trying to  void 70-80s d/t recent head injury.  Goal of Therapy:  Aptt goal ~50-65s Monitor platelets by anticoagulation protocol: Yes   Plan:  Start bival 0.05mg /kg/hr Check aptt in 4 hours then daily once at goal CBC daily  Erin Hearing PharmD., BCPS Clinical Pharmacist 01/08/2021 4:35 PM  Addendum:  Initial aptt on bival is within goal at 57s, no bleeding issues noted. Will continue at current rate and check confirmatory level in 4-6 hours. Will discuss with surgery in am if we should be more aggressive in dosing.    01/08/2021 9:37 PM

## 2021-01-08 NOTE — Progress Notes (Signed)
Patient plan for tracheostomy and PEG placement today.  He awakens to voice and appears somewhat aware.  He does not follow commands but he does move both sides symmetrically and purposefully.  His pupils are 2 mm on the right and 2-1/2 on the left.  They are both reactive.  Overall progressing slowly.  Patient with new onset A. fib with RVR.  Cardiology is recommending heparin drip.  I think he is far enough out from his contusions that heparin would be fine.  No new recommendations from my standpoint otherwise.

## 2021-01-08 NOTE — Progress Notes (Signed)
Trauma/Critical Care Follow Up Note  Subjective:    Overnight Issues:   Objective:  Vital signs for last 24 hours: Temp:  [100.5 F (38.1 C)-102.6 F (39.2 C)] 100.5 F (38.1 C) (08/23 0400) Pulse Rate:  [82-120] 97 (08/23 1000) Resp:  [17-33] 20 (08/23 1000) BP: (98-135)/(65-95) 106/70 (08/23 1000) SpO2:  [94 %-100 %] 95 % (08/23 1000) FiO2 (%):  [40 %] 40 % (08/23 0910) Weight:  [130.5 kg] 130.5 kg (08/23 0500)  Hemodynamic parameters for last 24 hours:    Intake/Output from previous day: 08/22 0701 - 08/23 0700 In: 3636.4 [I.V.:1589.1; NG/GT:1747.1; IV Piggyback:300.1] Out: 2540 [Urine:2515; Stool:25]  Intake/Output this shift: Total I/O In: 185.2 [I.V.:185.2] Out: -   Vent settings for last 24 hours: Vent Mode: PRVC FiO2 (%):  [40 %] 40 % Set Rate:  [20 bmp] 20 bmp Vt Set:  [650 mL] 650 mL PEEP:  [5 cmH20] 5 cmH20 Plateau Pressure:  [19 [id number] cmH20] 25 cmH20  Physical Exam:  Gen: comfortable, no distress Neuro: not f/c HEENT: L pupil slightly larger than R Neck: supple CV: RRR Pulm: unlabored breathing Abd: soft, obese, distended, tympanitic GU: clear yellow urine Extr: wwp, 2+ edema   Results for orders placed or performed during the hospital encounter of 12/28/20 (from the past 24 hour(s))  Glucose, capillary     Status: Abnormal   Collection Time: 01/07/21 12:25 PM  Result Value Ref Range   Glucose-Capillary 249 (H) 70 - 99 mg/dL  Glucose, capillary     Status: Abnormal   Collection Time: 01/07/21  4:21 PM  Result Value Ref Range   Glucose-Capillary 293 (H) 70 - 99 mg/dL  Glucose, capillary     Status: Abnormal   Collection Time: 01/07/21  7:46 PM  Result Value Ref Range   Glucose-Capillary 252 (H) 70 - 99 mg/dL  Glucose, capillary     Status: Abnormal   Collection Time: 01/07/21 11:33 PM  Result Value Ref Range   Glucose-Capillary 267 (H) 70 - 99 mg/dL  Surgical PCR screen     Status: None   Collection Time: 01/08/21 12:14 AM    Specimen: Nasal Mucosa; Nasal Swab  Result Value Ref Range   MRSA, PCR NEGATIVE NEGATIVE   Staphylococcus aureus NEGATIVE NEGATIVE  CBC     Status: Abnormal   Collection Time: 01/08/21  3:01 AM  Result Value Ref Range   WBC 13.3 (H) 4.0 - 10.5 K/uL   RBC 3.76 (L) 4.22 - 5.81 MIL/uL   Hemoglobin 9.5 (L) 13.0 - 17.0 g/dL   HCT 31.4 (L) 39.0 - 52.0 %   MCV 83.5 80.0 - 100.0 fL   MCH 25.3 (L) 26.0 - 34.0 pg   MCHC 30.3 30.0 - 36.0 g/dL   RDW 16.7 (H) 11.5 - 15.5 %   Platelets 237 150 - 400 K/uL   nRBC 0.2 0.0 - 0.2 %  Basic metabolic panel     Status: Abnormal   Collection Time: 01/08/21  3:01 AM  Result Value Ref Range   Sodium 144 135 - 145 mmol/L   Potassium 3.9 3.5 - 5.1 mmol/L   Chloride 112 (H) 98 - 111 mmol/L   CO2 24 22 - 32 mmol/L   Glucose, Bld 239 (H) 70 - 99 mg/dL   BUN 48 (H) 8 - 23 mg/dL   Creatinine, Ser 1.28 (H) 0.61 - 1.24 mg/dL   Calcium 8.8 (L) 8.9 - 10.3 mg/dL   GFR, Estimated >60 >60 mL/min   Anion  gap 8 5 - 15  Glucose, capillary     Status: Abnormal   Collection Time: 01/08/21  3:41 AM  Result Value Ref Range   Glucose-Capillary 204 (H) 70 - 99 mg/dL  Glucose, capillary     Status: Abnormal   Collection Time: 01/08/21  7:35 AM  Result Value Ref Range   Glucose-Capillary 163 (H) 70 - 99 mg/dL    Assessment & Plan: The plan of care was discussed with the bedside nurse for the day, who is in agreement with this plan and no additional concerns were raised.   Present on Admission: **None**    LOS: 11 days   Additional comments:I reviewed the patient's new clinical lab test results.   and I reviewed the patients new imaging test results.    Fall down stairs 8/12   VDRF - guaifenisen, 40% and PEEP to 5, not neurologically appropriate for extubation ID - resp CX with Strep/Pseudomonas, maxipime day 7, UA (-), BCx: pending, NGTD, duplex LE negative. WBC uptrending. Repeat resp cx, send duplex BUE, CT A/P TBI/SAH/SDH - NSGY c/s, Dr. Annette Stable, starting to  F/C. Significant frontal lobe injuries. Keppra x7d for sz ppx Occipital bone fx - NSGY c/s, Dr. Annette Stable Temporal bone fx extending into middle ear - ENT c/s, Dr. Constance Holster Right TM Rupture - ENT c/s, Dr. Constance Holster AFRVR - amio gtt, back to 10, cards recs for [facility] or agent change to cardizem. Will plan to start heparin gtt this PM after T/P. Hx DM2 - change to resistant SSI, glargine incr to 43u BID (171u/24h) Hx HTN - PRN meds FEN - NPO, cortrak/TF, lasix x1 VTE - SCDs, LMWH, will plan to start hep gtt without bolus this PM  Foley - removed Dispo - ICU, trach/PEG today  Critical Care Total Time: 50 minutes  Jesusita Oka, MD Trauma & General Surgery Please use [email or web address] to contact on call provider  01/08/2021  *Care during the described time interval was provided by me. I have reviewed this patient's available data, including medical history, events of note, physical examination and test results as part of my evaluation.

## 2021-01-08 NOTE — Progress Notes (Signed)
Mildly hypotensive, decreasing sedation to tolerate BP, however vent dyssynchrony. Discussed with Dr Loura Back. Orders obtained .

## 2021-01-08 NOTE — Progress Notes (Signed)
ANTICOAGULATION CONSULT NOTE - Initial Consult  Pharmacy Consult for heparin Indication: atrial fibrillation  No Known Allergies  Patient Measurements: Height: 6\' 2"  (188 cm) Weight: 130.5 kg (287 lb 11.2 oz) IBW/kg (Calculated) : 82.2 Heparin Dosing Weight: 107kg  Vital Signs: Temp: 100.5 F (38.1 C) (08/23 0400) Temp Source: Axillary (08/23 0400) BP: 106/70 (08/23 1000) Pulse Rate: 97 (08/23 1000)  Labs: Recent Labs    01/06/21 0435 01/07/21 0316 01/07/21 0442 01/08/21 0301  HGB 10.1* 10.0* 9.2* 9.5*  HCT 31.5* 33.1* 27.0* 31.4*  PLT PLATELET CLUMPS NOTED ON SMEAR, UNABLE TO ESTIMATE 248  --  237  CREATININE 0.93 0.98  --  1.28*    Estimated Creatinine Clearance: 77.1 mL/min (A) (by C-G formula based on SCr of 1.28 mg/dL (H)).   Medical History: Past Medical History:  Diagnosis Date   DM (diabetes mellitus) ([place])    HLD (hyperlipidemia)    Hypertension     Assessment: 40 YOM presenting s/p fall with TBI/SAH and facial fx, in afib started on amiodarone and now cleared per trauma for anticoagulation to start heparin gtt.  H/H low but stable, plts wnl.  Last dose of dvt ppx lovenox 8/22  Goal of Therapy:  Heparin level 0.3-0.5 units/ml Monitor platelets by anticoagulation protocol: Yes   Plan:  Heparin gtt at 1500 units/hr, no bolus, start after trach/PEG scheduled this afternoon  F/u 6 hour heparin level Daily heparin level, CBC, s/s bleeding  Bertis Ruddy, PharmD Clinical Pharmacist Please check AMION for all [place] numbers 01/08/2021 10:58 AM

## 2021-01-08 NOTE — Progress Notes (Addendum)
Upper extremity venous has been completed.   Preliminary results in CV Proc.   Abram Sander 01/08/2021 1:07 PM

## 2021-01-08 NOTE — OR Nursing (Signed)
Case cancelled after pt in room. Taking PT to CT#2

## 2021-01-08 NOTE — Progress Notes (Signed)
ANTICOAGULATION CONSULT NOTE - Initial Consult  Pharmacy Consult for heparin Indication: atrial fibrillation  No Known Allergies  Patient Measurements: Height: 6\' 2"  (188 cm) Weight: 130.5 kg (287 lb 11.2 oz) IBW/kg (Calculated) : 82.2 Heparin Dosing Weight: 107kg  Vital Signs: Temp: 100.5 F (38.1 C) (08/23 0400) Temp Source: Axillary (08/23 0400) BP: 138/92 (08/23 1100) Pulse Rate: 116 (08/23 1200)  Labs: Recent Labs    01/06/21 0435 01/07/21 0316 01/07/21 0442 01/08/21 0301  HGB 10.1* 10.0* 9.2* 9.5*  HCT 31.5* 33.1* 27.0* 31.4*  PLT PLATELET CLUMPS NOTED ON SMEAR, UNABLE TO ESTIMATE 248  --  237  CREATININE 0.93 0.98  --  1.28*     Estimated Creatinine Clearance: 77.1 mL/min (A) (by C-G formula based on SCr of 1.28 mg/dL (H)).   Medical History: Past Medical History:  Diagnosis Date   DM (diabetes mellitus) ([place])    HLD (hyperlipidemia)    Hypertension     Assessment: 61 YOM presenting s/p fall with TBI/SAH and facial fx, in afib started on amiodarone and now cleared per trauma for anticoagulation to start heparin gtt.  H/H low but stable, plts wnl.  Last dose of dvt ppx lovenox 8/22  Goal of Therapy:  Heparin level 0.3-0.5 units/ml Monitor platelets by anticoagulation protocol: Yes   Plan:  Heparin gtt at 1500 units/hr, no bolus, start after trach/PEG scheduled this afternoon  F/u 6 hour heparin level Daily heparin level, CBC, s/s bleeding  Addendum: Doppler with upper extremity DVT now, continue as planned above post op  Bertis Ruddy, PharmD Clinical Pharmacist Please check AMION for all [place] numbers 01/08/2021 1:24 PM

## 2021-01-09 ENCOUNTER — Inpatient Hospital Stay (HOSPITAL_COMMUNITY): Payer: PPO

## 2021-01-09 DIAGNOSIS — I152 Hypertension secondary to endocrine disorders: Secondary | ICD-10-CM | POA: Diagnosis not present

## 2021-01-09 DIAGNOSIS — J90 Pleural effusion, not elsewhere classified: Secondary | ICD-10-CM | POA: Diagnosis not present

## 2021-01-09 DIAGNOSIS — R579 Shock, unspecified: Secondary | ICD-10-CM | POA: Diagnosis not present

## 2021-01-09 DIAGNOSIS — A419 Sepsis, unspecified organism: Secondary | ICD-10-CM | POA: Diagnosis not present

## 2021-01-09 DIAGNOSIS — I4819 Other persistent atrial fibrillation: Secondary | ICD-10-CM

## 2021-01-09 DIAGNOSIS — I2699 Other pulmonary embolism without acute cor pulmonale: Secondary | ICD-10-CM

## 2021-01-09 DIAGNOSIS — I609 Nontraumatic subarachnoid hemorrhage, unspecified: Secondary | ICD-10-CM | POA: Diagnosis not present

## 2021-01-09 DIAGNOSIS — R918 Other nonspecific abnormal finding of lung field: Secondary | ICD-10-CM | POA: Diagnosis not present

## 2021-01-09 DIAGNOSIS — I700 Atherosclerosis of aorta: Secondary | ICD-10-CM

## 2021-01-09 DIAGNOSIS — I4891 Unspecified atrial fibrillation: Secondary | ICD-10-CM | POA: Diagnosis not present

## 2021-01-09 DIAGNOSIS — K6389 Other specified diseases of intestine: Secondary | ICD-10-CM | POA: Diagnosis not present

## 2021-01-09 DIAGNOSIS — I480 Paroxysmal atrial fibrillation: Secondary | ICD-10-CM | POA: Diagnosis not present

## 2021-01-09 DIAGNOSIS — E1159 Type 2 diabetes mellitus with other circulatory complications: Secondary | ICD-10-CM | POA: Diagnosis not present

## 2021-01-09 DIAGNOSIS — S0990XA Unspecified injury of head, initial encounter: Secondary | ICD-10-CM | POA: Diagnosis not present

## 2021-01-09 LAB — GASTROINTESTINAL PANEL BY PCR, STOOL (REPLACES STOOL CULTURE)

## 2021-01-09 LAB — BASIC METABOLIC PANEL
Anion gap: 11 (ref 5–15)
Anion gap: 7 (ref 5–15)
BUN: 61 mg/dL — ABNORMAL HIGH (ref 8–23)
BUN: 71 mg/dL — ABNORMAL HIGH (ref 8–23)
CO2: 21 mmol/L — ABNORMAL LOW (ref 22–32)
CO2: 23 mmol/L (ref 22–32)
Calcium: 8.4 mg/dL — ABNORMAL LOW (ref 8.9–10.3)
Calcium: 7.8 mg/dL — ABNORMAL LOW (ref 8.9–10.3)
Chloride: 113 mmol/L — ABNORMAL HIGH (ref 98–111)
Chloride: 113 mmol/L — ABNORMAL HIGH (ref 98–111)
Creatinine, Ser: 1.63 mg/dL — ABNORMAL HIGH (ref 0.61–1.24)
Creatinine, Ser: 1.85 mg/dL — ABNORMAL HIGH (ref 0.61–1.24)
GFR, Estimated: 45 mL/min — ABNORMAL LOW (ref >60)
GFR, Estimated: 39 mL/min — ABNORMAL LOW (ref >60)
Glucose, Bld: 203 mg/dL — ABNORMAL HIGH (ref 70–99)
Glucose, Bld: 223 mg/dL — ABNORMAL HIGH (ref 70–99)
Potassium: 4.2 mmol/L (ref 3.5–5.1)
Potassium: 3.9 mmol/L (ref 3.5–5.1)
Sodium: 145 mmol/L (ref 135–145)
Sodium: 143 mmol/L (ref 135–145)

## 2021-01-09 LAB — PHOSPHORUS: Phosphorus: 4.1 mg/dL (ref 2.5–4.6)

## 2021-01-09 LAB — GLUCOSE, CAPILLARY
Glucose-Capillary: 201 mg/dL — ABNORMAL HIGH (ref 70–99)
Glucose-Capillary: 223 mg/dL — ABNORMAL HIGH (ref 70–99)
Glucose-Capillary: 223 mg/dL — ABNORMAL HIGH (ref 70–99)
Glucose-Capillary: 226 mg/dL — ABNORMAL HIGH (ref 70–99)
Glucose-Capillary: 244 mg/dL — ABNORMAL HIGH (ref 70–99)
Glucose-Capillary: 252 mg/dL — ABNORMAL HIGH (ref 70–99)

## 2021-01-09 LAB — FECAL FAT, QUALITATIVE
Fat Qual Neutral, Stl: NORMAL
Fat Qual Total, Stl: NORMAL

## 2021-01-09 LAB — MAGNESIUM: Magnesium: 2.2 mg/dL (ref 1.7–2.4)

## 2021-01-09 LAB — C DIFFICILE (CDIFF) QUICK SCRN (NO PCR REFLEX)
C Diff antigen: NEGATIVE
C Diff interpretation: NOT DETECTED
C Diff toxin: NEGATIVE

## 2021-01-09 LAB — OSMOLALITY, STOOL: Osmolality,Stl: 319 mOsmol/kg

## 2021-01-09 LAB — APTT: aPTT: 62 seconds — ABNORMAL HIGH (ref 24–36)

## 2021-01-09 MED ORDER — SODIUM CHLORIDE 0.9% FLUSH
10.0000 mL | Freq: Two times a day (BID) | INTRAVENOUS | Status: DC
  Administered 2021-01-09 (×2): 10 mL
  Administered 2021-01-10: 30 mL
  Administered 2021-01-10 – 2021-01-14 (×8): 10 mL
  Administered 2021-01-15: 30 mL
  Administered 2021-01-15: 10 mL
  Administered 2021-01-16: 30 mL
  Administered 2021-01-16 – 2021-01-23 (×14): 10 mL
  Administered 2021-01-24: 30 mL
  Administered 2021-01-24: 10 mL
  Administered 2021-01-25: 20 mL
  Administered 2021-01-26 – 2021-01-27 (×4): 10 mL
  Administered 2021-01-28: 20 mL
  Administered 2021-01-28 – 2021-02-03 (×12): 10 mL
  Administered 2021-02-04: 30 mL
  Administered 2021-02-05 – 2021-02-11 (×11): 10 mL
  Administered 2021-02-11: 20 mL
  Administered 2021-02-12: 30 mL
  Administered 2021-02-12 – 2021-02-15 (×4): 10 mL

## 2021-01-09 MED ORDER — SODIUM CHLORIDE 0.9% FLUSH
10.0000 mL | INTRAVENOUS | Status: DC | PRN
  Administered 2021-02-08: 10 mL

## 2021-01-09 MED ORDER — SODIUM CHLORIDE 0.9 % IV SOLN
2.0000 g | Freq: Two times a day (BID) | INTRAVENOUS | Status: DC
  Administered 2021-01-09 – 2021-01-10 (×3): 2 g via INTRAVENOUS
  Filled 2021-01-09 (×4): qty 2

## 2021-01-09 MED ORDER — METOPROLOL TARTRATE 5 MG/5ML IV SOLN
2.5000 mg | INTRAVENOUS | Status: DC | PRN
  Administered 2021-01-09: 2.5 mg via INTRAVENOUS
  Filled 2021-01-09: qty 5

## 2021-01-09 NOTE — Progress Notes (Addendum)
[place] for bivalirudin Indication: atrial fibrillation, DVT, PE  No Known Allergies  Patient Measurements: Height: 6\' 2"  (188 cm) Weight: 131 kg (288 lb 12.8 oz) IBW/kg (Calculated) : 82.2 Heparin Dosing Weight: 107kg  Vital Signs: Temp: 100.1 F (37.8 C) (08/24 0700) Temp Source: Axillary (08/24 0700) BP: 119/78 (08/24 1158) Pulse Rate: 126 (08/24 1158)  Labs: Recent Labs    01/07/21 0316 01/07/21 0442 01/08/21 0301 01/08/21 2042 01/09/21 0221 01/09/21 0957  HGB 10.0* 9.2* 9.5*  --   --   --   HCT 33.1* 27.0* 31.4*  --   --   --   PLT 248  --  237  --   --   --   APTT  --   --   --  57*  --  62*  CREATININE 0.98  --  1.28*  --  1.63* 1.85*     Estimated Creatinine Clearance: 53.4 mL/min (A) (by C-G formula based on SCr of 1.85 mg/dL (H)).   Medical History: Past Medical History:  Diagnosis Date   DM (diabetes mellitus) ([place])    HLD (hyperlipidemia)    Hypertension     Assessment: 75 YOM presenting s/p fall with TBI/SAH and facial fx, in afib started on amiodarone and now cleared per trauma for full dose anticoagulation. Trach/PEG cancelled for 8/23 with patient found to have acute bilateral PE, upper extremity DVT and increased oxygen requirements. Pharmacy consulted to dose bivalirudin per Trauma.  Given patient's clinical status and recent head injury, will watch dosing and signs and symptoms of bleeding closely.   Will aim for middle of therapeutic range aptt of ~65s trying to avoid higher range 70-80s due to recent head injury.  AM update - aPTT now therapeutic x2. CBC stable (still pending for today). No current active bleed issues reported.  Goal of Therapy:  Aptt goal ~50-65s per discussion with Trauma ([place]) Monitor platelets by anticoagulation protocol: Yes   Plan:  Continue bivalirudin at 0.05mg /kg/hr Monitor daily aPTT/CBC, s/sx bleeding   Arturo Morton, PharmD, BCPS Please check AMION for all  [place] contact numbers Clinical Pharmacist 01/09/2021 12:08 PM  ADDENDUM - stop time for bivalirudin added for 8/25 at 0200 prior to patient's scheduled trach/peg procedure tomorrow morning. F/u timing of resumption post-op.

## 2021-01-09 NOTE — Progress Notes (Signed)
Progress Note  Patient Name: Angel [name] Date of Encounter: 01/09/2021  [facility] Cardiologist: Werner Lean, MD new to [facility]  Subjective   Overnight, Trach and PEG was cancelled.  Found to have PE without CT-RV Strain.  Started on bivalirudin.  Patient opens eyes on exam with increase in heart rate.  Inpatient Medications    Scheduled Meds:  acetaminophen  1,000 mg Per Tube Q6H   bethanechol  25 mg Per Tube TID   chlorhexidine gluconate (MEDLINE KIT)  15 mL Mouth Rinse BID   Chlorhexidine Gluconate Cloth  6 each Topical Q0600   clonazePAM  0.25 mg Per Tube BID   docusate  100 mg Per Tube BID   free water  200 mL Per Tube Q8H   guaiFENesin  10 mL Per Tube Q4H   insulin aspart  0-20 Units Subcutaneous Q4H   insulin aspart  10 Units Subcutaneous Q4H   insulin glargine-yfgn  43 Units Subcutaneous BID   mouth rinse  15 mL Mouth Rinse 10 times per day   methocarbamol  1,000 mg Per Tube Q8H   mupirocin ointment  1 application Nasal BID   pantoprazole sodium  40 mg Per Tube Daily   polyethylene glycol  17 g Per Tube Daily   potassium chloride  40 mEq Per Tube BID   senna  1 tablet Per Tube Daily   Continuous Infusions:  amiodarone 60 mg/hr (01/09/21 0700)   bivalirudin (ANGIOMAX) infusion 0.5 mg/mL (Non-ACS indications) 0.05 mg/kg/hr (01/09/21 0700)   ceFEPime (MAXIPIME) IV 200 mL/hr at 01/09/21 0700   dexmedetomidine (PRECEDEX) IV infusion 0.7 mcg/kg/hr (01/09/21 0700)   feeding supplement (PIVOT 1.5 CAL) 65 mL/hr at 01/08/21 1817   fentaNYL infusion INTRAVENOUS 175 mcg/hr (01/09/21 0700)   metronidazole 500 mg (01/09/21 0736)   phenylephrine (NEO-SYNEPHRINE) Adult infusion Stopped (01/08/21 2020)   PRN Meds: fentaNYL, labetalol, ondansetron **OR** ondansetron (ZOFRAN) IV, oxyCODONE, sodium phosphate   Vital Signs    Vitals:   01/09/21 0400 01/09/21 0500 01/09/21 0600 01/09/21 0700  BP: 129/71 99/63 (!) 88/64 (!) 95/53  Pulse: (!) 107 84 76 74   Resp: 20 20 20 20   Temp: 99 F (37.2 C)     TempSrc: Axillary     SpO2: 95% 97% 97% 97%  Weight:      Height:        Intake/Output Summary (Last 24 hours) at 01/09/2021 0751 Last data filed at 01/09/2021 0700 Gross per 24 hour  Intake 3646.02 ml  Output 2600 ml  Net 1046.02 ml   Last 3 Weights 01/09/2021 01/08/2021 01/07/2021  Weight (lbs) 288 lb 12.8 oz 287 lb 11.2 oz 294 lb 12.1 oz  Weight (kg) 131 kg 130.5 kg 133.7 kg      Telemetry    AF rates  in 80s when resting but to ~120s with minimal activity - Personally Reviewed  ECG    No new tracings - Personally Reviewed  Physical Exam   GEN: intubated Neck: Thick neck no RVD Cardiac: IRIR; tachycardia post abdominal exam, no murmurs, distant heart sounds Respiratory: intubated, coarse breath sounds GI: Soft, nontender with dullness to percussion MS: non pitting edema; No deformity. Neuro:  Nonfocal  Psych: Normal affect   Labs    High Sensitivity Troponin:  No results for input(s): TROPONINIHS in the last 720 hours.    Chemistry Recent Labs  Lab 01/07/21 0316 01/07/21 0442 01/08/21 0301 01/09/21 0221  NA 146* 148* 144 145  K 3.9  3.8 3.9 4.2  CL 113*  --  112* 113*  CO2 25  --  24 21*  GLUCOSE 289*  --  239* 203*  BUN 36*  --  48* 61*  CREATININE 0.98  --  1.28* 1.63*  CALCIUM 9.0  --  8.8* 8.4*  GFRNONAA >60  --  >60 45*  ANIONGAP 8  --  8 11     Hematology Recent Labs  Lab 01/06/21 0435 01/07/21 0316 01/07/21 0442 01/08/21 0301  WBC 7.6 12.1*  --  13.3*  RBC 3.89* 3.94*  --  3.76*  HGB 10.1* 10.0* 9.2* 9.5*  HCT 31.5* 33.1* 27.0* 31.4*  MCV 81.0 84.0  --  83.5  MCH 26.0 25.4*  --  25.3*  MCHC 32.1 30.2  --  30.3  RDW 16.6* 16.7*  --  16.7*  PLT PLATELET CLUMPS NOTED ON SMEAR, UNABLE TO ESTIMATE 248  --  237    BNPNo results for input(s): BNP, PROBNP in the last 168 hours.   DDimer No results for input(s): DDIMER in the last 168 hours.   Radiology    CT Angio Chest Pulmonary Embolism  (PE) W or WO Contrast  Result Date: 01/08/2021 CLINICAL DATA:  Concern for pulmonary embolism on preceding abdominal CT performed 01/08/2021 EXAM: CT ANGIOGRAPHY CHEST WITH CONTRAST TECHNIQUE: Multidetector CT imaging of the chest was performed using the standard protocol during bolus administration of intravenous contrast. Multiplanar CT image reconstructions and MIPs were obtained to evaluate the vascular anatomy. CONTRAST:  65mL OMNIPAQUE IOHEXOL 350 MG/ML SOLN COMPARISON:  CT abdomen pelvis-01/08/2021 FINDINGS: Vascular Findings: There is adequate opacification of the pulmonary arterial system with the main pulmonary artery measuring 279 Hounsfield units. There is an occlusive filling defect involving the segmental division of the right lower lobe pulmonary artery with associated oligemia of the posterior and medial basilar segments of the right lower lobe (axial image 69, series 5; coronal image 100, series 8). Additionally, there are nonocclusive filling defects within the left lower anteromedial segmental pulmonary artery (axial image 76, series 5; coronal image 102, series 8) as well as the lingular branch of the left pulmonary artery (axial image 65, series 5, coronal image 94, series 8). Overall clot burden is deemed small in volume. There is no CT evidence of intraventricular septal bowing to suggest right-sided heart strain. Normal caliber of the main pulmonary artery. Borderline cardiomegaly. Coronary artery calcifications. No pericardial effusion. No evidence of thoracic aortic aneurysm or dissection. Bovine configuration of the aortic arch. Review of the MIP images confirms the above findings. ---------------------------------------------------------------------------------- Nonvascular Findings: Mediastinum/Lymph Nodes: No bulky mediastinal, hilar axillary lymphadenopathy. Lungs/Pleura: Extensive consolidative opacities and air bronchograms, most severely affecting the bilateral lower lobes as well  as the dependent portion of the bilateral upper lobes. This finding is again associated with relative oligemia involving the posterior basilar segment of the right lower lobe. Endotracheal tube terminates superior to the carina. Rather extensive air bronchograms are seen involving the bilateral lower lobes as well as the posterior aspects of the bilateral upper lobes. Small/trace potentially loculated bilateral pleural effusions, right slightly greater than left. No pneumothorax. Upper abdomen: Limited early arterial phase evaluation of the upper abdomen demonstrates an enteric tube tip terminating at the level of the gastric antrum. There is a minimal amount of high density material seen within the gallbladder, likely vicarious excretion of contrast. Redemonstrated marked distension of the imaged portions of the mid transverse colon. Musculoskeletal: Thyromegaly with suspected approximately 3.6 x 3.6 cm hypoattenuating  right-sided thyroid nodule/mass (image 1, series 5). IMPRESSION: 1. Examination is positive for bilateral pulmonary embolism, most significantly affecting the right lower lobe pulmonary artery with associated reduced perfusion of the posterior and medial basilar segments of the right lower lobe, potentially indicative of infarction. Overall clot burden is deemed small in volume and there is no CT evidence of right-sided heart strain. 2. Extensive bilateral consolidative opacities and associated air bronchograms, most significantly affecting the bilateral lower lobes, worrisome for multifocal infection and/or aspiration. 3. Small/trace potentially partially loculated bilateral effusions, right greater than left. Effusions are currently too small to warrant bedside or ultrasound-guided aspiration. 4. Coronary calcifications.  Aortic Atherosclerosis (ICD10-I70.0). 5. Indeterminate approximately 3.6 cm right-sided thyroid nodule/mass. Further evaluation with nonemergent thyroid ultrasound could be  performed as indicated. Above findings discussed with Dr. Bobbye Morton at the time of procedure completion. Electronically Signed   By: Sandi Mariscal M.D.   On: 01/08/2021 16:31   CT ABDOMEN PELVIS W CONTRAST  Result Date: 01/08/2021 CLINICAL DATA:  Abdominal pain, fever EXAM: CT ABDOMEN AND PELVIS WITH CONTRAST TECHNIQUE: Multidetector CT imaging of the abdomen and pelvis was performed using the standard protocol following bolus administration of intravenous contrast. CONTRAST:  124m OMNIPAQUE IOHEXOL 350 MG/ML SOLN COMPARISON:  12/28/2020 FINDINGS: Lower chest: Interval increase in bilateral pleural effusions, moderate on the right, small on the left. There is near complete atelectasis or consolidation of the right lower lobe and ground-glass airspace clearing in the deep lung base (series 5, image 13). Appearance of the right lower lobar pulmonary arteries is suspicious for embolus, although poorly evaluated on this non tailored examination (series 3, image 3). Coronary artery calcifications. Hepatobiliary: No solid liver abnormality is seen. No gallstones, gallbladder wall thickening, or biliary dilatation. Pancreas: Unremarkable. No pancreatic ductal dilatation or surrounding inflammatory changes. Spleen: Normal in size without significant abnormality. Adrenals/Urinary Tract: Adrenal glands are unremarkable. Simple bilateral renal cysts. Kidneys are otherwise normal, without renal calculi, solid lesion, or hydronephrosis. Bladder is unremarkable. Stomach/Bowel: Stomach is within normal limits. Enteric feeding tube is position with tip in the gastric antrum. Appendix appears normal. The colon is diffusely dilated and fluid-filled to the rectum, the cecum measuring up to 10.2 cm in caliber (series 8, image 80). Vascular/Lymphatic: Aortic atherosclerosis. No enlarged abdominal or pelvic lymph nodes. Reproductive: No mass or other significant abnormality. Other: Anasarca. Small, fat containing bilateral inguinal  hernias. Trace ascites throughout the abdomen and pelvis. Musculoskeletal: No acute or significant osseous findings. IMPRESSION: 1. Interval increase in bilateral pleural effusions, moderate on the right, small on the left. There is near complete atelectasis or consolidation of the right lower lobe and ground-glass airspace clearing in the deep lung base. Appearance of the right lower lobar pulmonary arteries is suspicious for embolus, especially given appearance of the lung parenchyma which is characteristic of pulmonary infarction, although poorly evaluated on this non tailored examination. Consider CT pulmonary angiogram to further evaluate. 2. The colon is diffusely dilated and fluid-filled to the rectum, the cecum measuring up to 10.2 cm in caliber. Findings are consistent with diarrheal illness and potentially consistent with toxic megacolon. 3. Trace ascites throughout the abdomen and pelvis. Anasarca. 4. Coronary artery disease. Aortic Atherosclerosis (ICD10-I70.0). Electronically Signed   By: AEddie CandleM.D.   On: 01/08/2021 12:35   ECHOCARDIOGRAM COMPLETE  Result Date: 01/07/2021    ECHOCARDIOGRAM REPORT   Patient Name:   RROSA GAMBALEDate of Exam: 01/07/2021 Medical Rec #:  [id number]  Height:  74.0 in Accession #:    [id number]  Weight:       294.8 lb Date of Birth:  August 23, 1950   BSA:          2.563 m Patient Age:    70 years    BP:           137/77 mmHg Patient Gender: M           HR:           111 bpm. Exam Location:  Inpatient Procedure: 2D Echo, Cardiac Doppler, Color Doppler, 3D Echo and Intracardiac            Opacification Agent Indications:    Atrial Fibrillation  History:        Patient has no prior history of Echocardiogram examinations.                 Patient fell from stairs and cracked his skull.  Sonographer:    Merrie Roof RDCS Referring Phys: [id number] [facility]  Sonographer Comments: Suboptimal apical window, no subcostal window, patient is morbidly obese and echo  performed with patient supine and on artificial respirator. Image acquisition challenging due to patient body habitus. IMPRESSIONS  1. Left ventricular ejection fraction, by estimation, is 60 to 65%. The left ventricle has normal function. The left ventricle has no regional wall motion abnormalities. There is mild left ventricular hypertrophy. Left ventricular diastolic parameters are indeterminate.  2. Right ventricule is poorly visualized but grossly normal size and systolic function  3. Left atrial size was mildly dilated.  4. Right atrial size was mildly dilated.  5. The mitral valve is normal in structure. No evidence of mitral valve regurgitation. No evidence of mitral stenosis.  6. The aortic valve was not well visualized. Aortic valve regurgitation is not visualized. No aortic stenosis is present. FINDINGS  Left Ventricle: Left ventricular ejection fraction, by estimation, is 60 to 65%. The left ventricle has normal function. The left ventricle has no regional wall motion abnormalities. Definity contrast agent was given IV to delineate the left ventricular  endocardial borders. The left ventricular internal cavity size was normal in size. There is mild left ventricular hypertrophy. Left ventricular diastolic parameters are indeterminate. Right Ventricle: The right ventricular size is not well visualized. Right vetricular wall thickness was not well visualized. Right ventricular systolic function was not well visualized. Left Atrium: Left atrial size was mildly dilated. Right Atrium: Right atrial size was mildly dilated. Pericardium: There is no evidence of pericardial effusion. Presence of pericardial fat pad. Mitral Valve: The mitral valve is normal in structure. No evidence of mitral valve regurgitation. No evidence of mitral valve stenosis. Tricuspid Valve: The tricuspid valve is normal in structure. Tricuspid valve regurgitation is trivial. Aortic Valve: The aortic valve was not well visualized. Aortic  valve regurgitation is not visualized. No aortic stenosis is present. Aortic valve mean gradient measures 5.0 mmHg. Aortic valve peak gradient measures 9.1 mmHg. Aortic valve area, by VTI measures 2.97 cm. Pulmonic Valve: The pulmonic valve was not well visualized. Pulmonic valve regurgitation is not visualized. Aorta: The aortic root is normal in size and structure. IAS/Shunts: The interatrial septum was not well visualized.  LEFT VENTRICLE PLAX 2D LVIDd:         4.35 cm LVIDs:         3.20 cm LV PW:         1.20 cm LV IVS:        1.25 cm LVOT diam:  2.00 cm LV SV:         53 LV SV Index:   21 LVOT Area:     3.14 cm  RIGHT VENTRICLE RV Basal diam:  3.20 cm LEFT ATRIUM            Index       RIGHT ATRIUM           Index LA diam:      4.25 cm  1.66 cm/m  RA Area:     23.60 cm LA Vol (A4C): 104.0 ml 40.58 ml/m RA Volume:   80.30 ml  31.33 ml/m  AORTIC VALVE AV Area (Vmax):    2.68 cm AV Area (Vmean):   2.33 cm AV Area (VTI):     2.97 cm AV Vmax:           151.00 cm/s AV Vmean:          111.000 cm/s AV VTI:            0.179 m AV Peak Grad:      9.1 mmHg AV Mean Grad:      5.0 mmHg LVOT Vmax:         129.00 cm/s LVOT Vmean:        82.200 cm/s LVOT VTI:          0.169 m LVOT/AV VTI ratio: 0.94  AORTA Ao Root diam: 3.90 cm  SHUNTS Systemic VTI:  0.17 m Systemic Diam: 2.00 cm Oswaldo Milian MD Electronically signed by Oswaldo Milian MD Signature Date/Time: 01/07/2021/7:04:11 PM    Final    VAS [place] UPPER EXTREMITY VENOUS DUPLEX  Result Date: 01/08/2021 UPPER VENOUS STUDY  Patient Name:  JOVANI FLURY  Date of Exam:   01/08/2021 Medical Rec #: [id number]    Accession #:    [id number] Date of Birth: Apr 23, 1951    Patient Gender: M Patient Age:   63 years Exam Location:  [facility] Procedure:      VAS [place] UPPER EXTREMITY VENOUS DUPLEX Referring Phys: Reather Laurence --------------------------------------------------------------------------------  Indications: fever Comparison Study: no prior  Performing Technologist: Archie Patten RVS  Examination Guidelines: A complete evaluation includes B-mode imaging, spectral Doppler, color Doppler, and power Doppler as needed of all accessible portions of each vessel. Bilateral testing is considered an integral part of a complete examination. Limited examinations for reoccurring indications may be performed as noted.  Right Findings: +----------+------------+---------+-----------+----------+-------+ RIGHT     CompressiblePhasicitySpontaneousPropertiesSummary +----------+------------+---------+-----------+----------+-------+ IJV           Full       Yes       Yes                      +----------+------------+---------+-----------+----------+-------+ Subclavian    Full       Yes       Yes                      +----------+------------+---------+-----------+----------+-------+ Axillary      Full       Yes       Yes                      +----------+------------+---------+-----------+----------+-------+ Brachial      Full       Yes       Yes                      +----------+------------+---------+-----------+----------+-------+ Radial  Full                                          +----------+------------+---------+-----------+----------+-------+ Ulnar         Full                                          +----------+------------+---------+-----------+----------+-------+ Cephalic      Full                                          +----------+------------+---------+-----------+----------+-------+ Basilic       Full                                          +----------+------------+---------+-----------+----------+-------+  Left Findings: +----------+------------+---------+-----------+----------+-----------------+ LEFT      CompressiblePhasicitySpontaneousProperties     Summary      +----------+------------+---------+-----------+----------+-----------------+ IJV           Full       Yes        Yes                                +----------+------------+---------+-----------+----------+-----------------+ Subclavian    Full       Yes       Yes                                +----------+------------+---------+-----------+----------+-----------------+ Axillary      Full       Yes       Yes                                +----------+------------+---------+-----------+----------+-----------------+ Brachial      Full       Yes       Yes                                +----------+------------+---------+-----------+----------+-----------------+ Radial        Full                                                    +----------+------------+---------+-----------+----------+-----------------+ Ulnar         Full                                                    +----------+------------+---------+-----------+----------+-----------------+ Cephalic      None                                  Age Indeterminate +----------+------------+---------+-----------+----------+-----------------+ Basilic  None                                  Age Indeterminate +----------+------------+---------+-----------+----------+-----------------+  Summary:  Right: No evidence of deep vein thrombosis in the upper extremity. No evidence of superficial vein thrombosis in the upper extremity. No evidence of thrombosis in the subclavian.  Left: Findings consistent with age indeterminate superficial vein thrombosis involving the left cephalic vein and left basilic vein.  *See table(s) above for measurements and observations.  Diagnosing physician: Deitra Mayo MD Electronically signed by Deitra Mayo MD on 01/08/2021 at 2:48:33 PM.    Final    [place] EKG SITE RITE  Result Date: 01/08/2021 If Site Rite image not attached, placement could not be confirmed due to current cardiac rhythm.  US Abdomen Limited RUQ (LIVER/GB)  Result Date: 01/08/2021 CLINICAL DATA:  Acalculous  cholecystitis, abdominal pain, fever EXAM: ULTRASOUND ABDOMEN LIMITED RIGHT UPPER QUADRANT COMPARISON:  01/08/2021 FINDINGS: Gallbladder: There are no shadowing gallstones. Mild gallbladder wall thickening measuring up to 5 mm. No pericholecystic fluid. Negative sonographic Murphy sign. Common bile duct: Diameter: 4 mm Liver: No focal lesion identified. Within normal limits in parenchymal echogenicity. Portal vein is patent on color Doppler imaging with normal direction of blood flow towards the liver. Other: Trace free fluid right upper quadrant. IMPRESSION: 1. Trace free fluid right upper quadrant. 2. Nonspecific gallbladder wall thickening measuring 5 mm. No evidence of gallbladder sludge or cholelithiasis. If acute cholecystitis is a concern, nuclear medicine hepatobiliary scan could be considered. Electronically Signed   By: Randa Ngo M.D.   On: 01/08/2021 20:00    Cardiac Studies   Echo 01/07/21: 1. Left ventricular ejection fraction, by estimation, is 60 to 65%. The  left ventricle has normal function. The left ventricle has no regional  wall motion abnormalities. There is mild left ventricular hypertrophy.  Left ventricular diastolic parameters  are indeterminate.   2. Right ventricule is poorly visualized but grossly normal size and  systolic function   3. Left atrial size was mildly dilated.   4. Right atrial size was mildly dilated.   5. The mitral valve is normal in structure. No evidence of mitral valve  regurgitation. No evidence of mitral stenosis.   6. The aortic valve was not well visualized. Aortic valve regurgitation  is not visualized. No aortic stenosis is present.  CTPE: Date: 01/08/21 Results: A. Bilateral PE B. 3V CAC and Aortic Atherosclerosis C. Multifocal lung consolidation D. Bilateral pleural effusion   Patient Profile     70 y.o. male with a hx of hypertension, diabetes mellitus and hyperlipidemia who is being seen 01/07/2021 for the evaluation of atrial  fibrillation.  Pt hospitalized after falling down 8 stairs and suffering SAH and occipital and temporal bone fractures. Afib noted 01/04/21 and started on amiodarone.  Assessment & Plan    Afib RVR New Pulmonary embolism - presently plan to continue IV amiodarone, post PEG tube may transition to oral - on Bivalirudin - though course neurosurgery, trauma surgery and cardiology have discussed risks and benefits of anticoagulation (see prior notes) - BP presently may not support additional AV nodal agents; If SBP persistently 100-110s, we could start metoprolol 12.5 mg PO BID for heart rate support' presently increase in heart rates does not appear to be hemodynamically significant  SAH - per neurosurgery  Respiratory infection Hypoxic respiratory failure - respiratory cultures grew strep/pseudomonas - ABX per primary - if  no improvement given small pleural effusions on CT, re-bolus of lasix 60 IV is reasonable    Aortic Atherosclerosis and Coronary artery calcifications - prevention strategy will be based on recover through this short and medium term (post trach-peg and weaning) - LDL goal < 70   For questions or updates, please contact [place] Please consult [email or web address] for contact info under        Signed, Werner Lean, MD  01/09/2021, 7:51 AM

## 2021-01-09 NOTE — Progress Notes (Addendum)
Trauma/Critical Care Follow Up Note  Subjective:    Overnight Issues:   Objective:  Vital signs for last 24 hours: Temp:  [99 F (37.2 C)-100.5 F (38.1 C)] 99 F (37.2 C) (08/24 0400) Pulse Rate:  [74-137] 104 (08/24 0810) Resp:  [16-28] 20 (08/24 0810) BP: (88-138)/(48-92) 100/48 (08/24 0810) SpO2:  [91 %-100 %] 97 % (08/24 0810) FiO2 (%):  [40 %-60 %] 50 % (08/24 0810) Weight:  [131 kg] 131 kg (08/24 0348)  Hemodynamic parameters for last 24 hours:    Intake/Output from previous day: 08/23 0701 - 08/24 0700 In: 3646 [I.V.:1966; NG/GT:980; IV Piggyback:700] Out: 4000 [Urine:3050; Stool:950]  Intake/Output this shift: Total I/O In: 283.7 [I.V.:68.7; NG/GT:115; IV Piggyback:100] Out: -   Vent settings for last 24 hours: Vent Mode: PRVC FiO2 (%):  [40 %-60 %] 50 % Set Rate:  [20 bmp] 20 bmp Vt Set:  [650 mL] 650 mL PEEP:  [5 cmH20] 5 cmH20 Plateau Pressure:  [24 cmH20-30 cmH20] 30 cmH20  Physical Exam:  Gen: comfortable, no distress Neuro: sluggish and weak but attempts to f/c HEENT: L>R slightly, RRLB Neck: supple CV: AF, intermittent RVR Pulm: unlabored breathing on MV, 50% and 5 Abd: soft, NT, distended GU: clear yellow urine Extr: wwp, 1+ edema   Results for orders placed or performed during the hospital encounter of 12/28/20 (from the past 24 hour(s))  Glucose, capillary     Status: Abnormal   Collection Time: 01/08/21 11:15 AM  Result Value Ref Range   Glucose-Capillary 142 (H) 70 - 99 mg/dL  Culture, Respiratory w Gram Stain     Status: None (Preliminary result)   Collection Time: 01/08/21  1:24 PM   Specimen: Tracheal Aspirate; Respiratory  Result Value Ref Range   Specimen Description TRACHEAL ASPIRATE    Special Requests NONE    Gram Stain      RARE SQUAMOUS EPITHELIAL CELLS PRESENT MODERATE WBC PRESENT, PREDOMINANTLY MONONUCLEAR FEW GRAM NEGATIVE RODS Performed at [facility], [place] [address], [place], [place] [address]     Culture PENDING    Report Status PENDING   Glucose, capillary     Status: None   Collection Time: 01/08/21  4:34 PM  Result Value Ref Range   Glucose-Capillary 74 70 - 99 mg/dL  Glucose, capillary     Status: Abnormal   Collection Time: 01/08/21  7:58 PM  Result Value Ref Range   Glucose-Capillary 145 (H) 70 - 99 mg/dL  APTT     Status: Abnormal   Collection Time: 01/08/21  8:42 PM  Result Value Ref Range   aPTT 57 (H) 24 - 36 seconds  Glucose, capillary     Status: Abnormal   Collection Time: 01/08/21 11:33 PM  Result Value Ref Range   Glucose-Capillary 208 (H) 70 - 99 mg/dL  Basic metabolic panel     Status: Abnormal   Collection Time: 01/09/21  2:21 AM  Result Value Ref Range   Sodium 145 135 - 145 mmol/L   Potassium 4.2 3.5 - 5.1 mmol/L   Chloride 113 (H) 98 - 111 mmol/L   CO2 21 (L) 22 - 32 mmol/L   Glucose, Bld 203 (H) 70 - 99 mg/dL   BUN 61 (H) 8 - 23 mg/dL   Creatinine, Ser 1.63 (H) 0.61 - 1.24 mg/dL   Calcium 8.4 (L) 8.9 - 10.3 mg/dL   GFR, Estimated 45 (L) >60 mL/min   Anion gap 11 5 - 15  Glucose, capillary  Status: Abnormal   Collection Time: 01/09/21  3:42 AM  Result Value Ref Range   Glucose-Capillary 201 (H) 70 - 99 mg/dL  Glucose, capillary     Status: Abnormal   Collection Time: 01/09/21  7:44 AM  Result Value Ref Range   Glucose-Capillary 223 (H) 70 - 99 mg/dL    Assessment & Plan: The plan of care was discussed with the bedside nurse for the day, who is in agreement with this plan and no additional concerns were raised.   Present on Admission: **None**    LOS: 12 days   Additional comments:I reviewed the patient's new clinical lab test results.   and I reviewed the patients new imaging test results.    Fall down stairs 8/12   VDRF - guaifenisen, 50% and PEEP to 5, not neurologically appropriate for extubation. T/P cancelled yesterday, rescheduled for 8/25. ID - resp CX with Strep/Pseudomonas, maxipime day 8, flagyl day 2, UA (-), BCx:  pending, NGTD, duplex LE negative. Repeat resp cx with GNRs, duplex BUE 8/23 with SVT, CT A/P with ascending colitis and distention. Stool studies and C. dif pending. RUQ u/s indeterminate. TBI/SAH/SDH - NSGY c/s, Dr. Annette Stable, starting to F/C. Significant frontal lobe injuries. Keppra x7d for sz ppx Occipital bone fx - NSGY c/s, Dr. Annette Stable Temporal bone fx extending into middle ear - ENT c/s, Dr. Constance Holster Right TM Rupture - ENT c/s, Dr. Constance Holster AFRVR - amio gtt, back to 56, AC with bival, tx-ic Bilateral pulmonary embolism - bival, hold at 0200 for T/P in AM  AKI - likely some element of dehydration worsened by contrast admin x2 yest. Hold off on diuresis today. Hx DM2 - resistant SSI, glargine 43u BID (171u/24h) Hx HTN - PRN meds FEN - NPO, cortrak/TF VTE - SCDs, bival gtt Foley - removed Dispo - ICU, trach/PEG today  Critical Care Total Time: 45 minutes  Jesusita Oka, MD Trauma & General Surgery Please use [email or web address] to contact on call provider  01/09/2021  *Care during the described time interval was provided by me. I have reviewed this patient's available data, including medical history, events of note, physical examination and test results as part of my evaluation.

## 2021-01-09 NOTE — Progress Notes (Signed)
Peripherally Inserted Central Catheter Placement  The IV Nurse has discussed with the patient and/or persons authorized to consent for the patient, the purpose of this procedure and the potential benefits and risks involved with this procedure.  The benefits include less needle sticks, lab draws from the catheter, and the patient may be discharged home with the catheter. Risks include, but not limited to, infection, bleeding, blood clot (thrombus formation), and puncture of an artery; nerve damage and irregular heartbeat and possibility to perform a PICC exchange if needed/ordered by physician.  Alternatives to this procedure were also discussed.  Bard Power PICC patient education guide, fact sheet on infection prevention and patient information card has been provided to patient /or left at bedside.    PICC Placement Documentation  PICC Triple Lumen 33/38/32 PICC Right Basilic 47 cm 1 cm (Active)  Indication for Insertion or Continuance of Line Limited venous access - need for IV therapy >5 days (PICC only) 01/09/21 0916  Exposed Catheter (cm) 1 cm 01/09/21 0916  Site Assessment Clean;Dry;Intact 01/09/21 0916  Lumen #1 Status Flushed;Blood return noted 01/09/21 0916  Lumen #2 Status Flushed;Blood return noted 01/09/21 0916  Lumen #3 Status Flushed;Blood return noted 01/09/21 0916  Dressing Type Transparent 01/09/21 0916  Dressing Status Clean;Dry;Intact 01/09/21 0916  Antimicrobial disc in place? Yes 01/09/21 0916  Dressing Intervention New dressing;Other (Comment) 01/09/21 0916  Dressing Change Due 01/16/21 01/09/21 0916   Telephone consent signed by wife    Angel [name] 01/09/2021, 9:17 AM

## 2021-01-09 NOTE — Progress Notes (Signed)
Inpatient Diabetes Program Recommendations  AACE/ADA: New Consensus Statement on Inpatient Glycemic Control (2015)  Target Ranges:  Prepandial:   less than 140 mg/dL      Peak postprandial:   less than 180 mg/dL (1-2 hours)      Critically ill patients:  140 - 180 mg/dL   Lab Results  Component Value Date   ZOXWRU 045 (H) 01/09/2021    Review of Glycemic Control Results for TAKUMI, DIN (MRN [id number]) as of 01/09/2021 10:28  Ref. Range 01/08/2021 19:58 01/08/2021 23:33 01/09/2021 03:42 01/09/2021 07:44  Glucose-Capillary Latest Ref Range: 70 - 99 mg/dL 145 (H) 208 (H) 201 (H) 223 (H)     Diabetes history: DM2 Outpatient Diabetes medications: Glipizide 2.5 mg daily, Tradjenta 5 mg daily, Metformin 1000 mg BID, Actos 45 mg QHS Current orders for Inpatient glycemic control: Semglee 43 units BID, Novolog 0-20 units Q4H, Novolog 10 units Q4H Pivot @ 65 ml/hr   Inpatient Diabetes Program Recommendations:    Consider increasing tube feed coverage to Novolog 12 units Q4H (to be stopped or held in the event tube feeds are stopped).   Thanks, Bronson Curb, MSN, RNC-OB Diabetes Coordinator [phone] (8a-5p)

## 2021-01-09 NOTE — Progress Notes (Signed)
While administering medications, the patient was more responsive than I had noticed previously. The patient was able to stick out his tongue, give thumbs up on both hands, and reacted by turning his head to his name.

## 2021-01-10 ENCOUNTER — Inpatient Hospital Stay (HOSPITAL_COMMUNITY): Payer: PPO

## 2021-01-10 ENCOUNTER — Inpatient Hospital Stay (HOSPITAL_COMMUNITY): Payer: PPO | Admitting: Anesthesiology

## 2021-01-10 ENCOUNTER — Encounter (HOSPITAL_COMMUNITY): Admission: EM | Disposition: A | Discharge status: Still In Hospital | Payer: Self-pay | Source: Home / Self Care

## 2021-01-10 DIAGNOSIS — J90 Pleural effusion, not elsewhere classified: Secondary | ICD-10-CM | POA: Diagnosis not present

## 2021-01-10 DIAGNOSIS — I4819 Other persistent atrial fibrillation: Secondary | ICD-10-CM | POA: Diagnosis not present

## 2021-01-10 DIAGNOSIS — R9431 Abnormal electrocardiogram [ECG] [EKG]: Secondary | ICD-10-CM | POA: Diagnosis not present

## 2021-01-10 DIAGNOSIS — I609 Nontraumatic subarachnoid hemorrhage, unspecified: Secondary | ICD-10-CM | POA: Diagnosis not present

## 2021-01-10 DIAGNOSIS — I700 Atherosclerosis of aorta: Secondary | ICD-10-CM | POA: Diagnosis not present

## 2021-01-10 DIAGNOSIS — I152 Hypertension secondary to endocrine disorders: Secondary | ICD-10-CM | POA: Diagnosis not present

## 2021-01-10 DIAGNOSIS — I480 Paroxysmal atrial fibrillation: Secondary | ICD-10-CM | POA: Diagnosis not present

## 2021-01-10 DIAGNOSIS — A419 Sepsis, unspecified organism: Secondary | ICD-10-CM | POA: Diagnosis not present

## 2021-01-10 DIAGNOSIS — R579 Shock, unspecified: Secondary | ICD-10-CM | POA: Diagnosis not present

## 2021-01-10 DIAGNOSIS — I4891 Unspecified atrial fibrillation: Secondary | ICD-10-CM | POA: Diagnosis not present

## 2021-01-10 DIAGNOSIS — I2699 Other pulmonary embolism without acute cor pulmonale: Secondary | ICD-10-CM | POA: Diagnosis not present

## 2021-01-10 DIAGNOSIS — E1159 Type 2 diabetes mellitus with other circulatory complications: Secondary | ICD-10-CM | POA: Diagnosis not present

## 2021-01-10 LAB — COMPREHENSIVE METABOLIC PANEL
ALT: 34 U/L (ref 0–44)
AST: 30 U/L (ref 15–41)
Albumin: 1.7 g/dL — ABNORMAL LOW (ref 3.5–5.0)
Alkaline Phosphatase: 53 U/L (ref 38–126)
Anion gap: 10 (ref 5–15)
BUN: 94 mg/dL — ABNORMAL HIGH (ref 8–23)
CO2: 20 mmol/L — ABNORMAL LOW (ref 22–32)
Calcium: 7.9 mg/dL — ABNORMAL LOW (ref 8.9–10.3)
Chloride: 111 mmol/L (ref 98–111)
Creatinine, Ser: 2.77 mg/dL — ABNORMAL HIGH (ref 0.61–1.24)
GFR, Estimated: 24 mL/min — ABNORMAL LOW (ref >60)
Glucose, Bld: 317 mg/dL — ABNORMAL HIGH (ref 70–99)
Potassium: 3.5 mmol/L (ref 3.5–5.1)
Sodium: 141 mmol/L (ref 135–145)
Total Bilirubin: 0.4 mg/dL (ref 0.3–1.2)
Total Protein: 5.7 g/dL — ABNORMAL LOW (ref 6.5–8.1)

## 2021-01-10 LAB — GLUCOSE, CAPILLARY
Glucose-Capillary: 161 mg/dL — ABNORMAL HIGH (ref 70–99)
Glucose-Capillary: 163 mg/dL — ABNORMAL HIGH (ref 70–99)
Glucose-Capillary: 205 mg/dL — ABNORMAL HIGH (ref 70–99)
Glucose-Capillary: 211 mg/dL — ABNORMAL HIGH (ref 70–99)
Glucose-Capillary: 223 mg/dL — ABNORMAL HIGH (ref 70–99)
Glucose-Capillary: 95 mg/dL (ref 70–99)

## 2021-01-10 LAB — BASIC METABOLIC PANEL
Anion gap: 11 (ref 5–15)
BUN: 89 mg/dL — ABNORMAL HIGH (ref 8–23)
CO2: 19 mmol/L — ABNORMAL LOW (ref 22–32)
Calcium: 8 mg/dL — ABNORMAL LOW (ref 8.9–10.3)
Chloride: 114 mmol/L — ABNORMAL HIGH (ref 98–111)
Creatinine, Ser: 2.08 mg/dL — ABNORMAL HIGH (ref 0.61–1.24)
GFR, Estimated: 34 mL/min — ABNORMAL LOW (ref >60)
Glucose, Bld: 207 mg/dL — ABNORMAL HIGH (ref 70–99)
Potassium: 4.4 mmol/L (ref 3.5–5.1)
Sodium: 144 mmol/L (ref 135–145)

## 2021-01-10 LAB — CULTURE, BLOOD (ROUTINE X 2)
Culture: NO GROWTH
Culture: NO GROWTH
Special Requests: ADEQUATE

## 2021-01-10 LAB — TROPONIN I (HIGH SENSITIVITY)
Troponin I (High Sensitivity): 52 ng/L — ABNORMAL HIGH (ref <18)
Troponin I (High Sensitivity): 56 ng/L — ABNORMAL HIGH (ref <18)

## 2021-01-10 LAB — CBC
HCT: 26.6 % — ABNORMAL LOW (ref 39.0–52.0)
Hemoglobin: 7.9 g/dL — ABNORMAL LOW (ref 13.0–17.0)
MCH: 25.2 pg — ABNORMAL LOW (ref 26.0–34.0)
MCHC: 29.7 g/dL — ABNORMAL LOW (ref 30.0–36.0)
MCV: 84.7 fL (ref 80.0–100.0)
Platelets: 290 10*3/uL (ref 150–400)
RBC: 3.14 MIL/uL — ABNORMAL LOW (ref 4.22–5.81)
RDW: 17.1 % — ABNORMAL HIGH (ref 11.5–15.5)
WBC: 13.8 10*3/uL — ABNORMAL HIGH (ref 4.0–10.5)
nRBC: 0 % (ref 0.0–0.2)

## 2021-01-10 LAB — APTT
aPTT: 67 s — ABNORMAL HIGH (ref 24–36)
aPTT: 67 s — ABNORMAL HIGH (ref 24–36)

## 2021-01-10 LAB — ECHOCARDIOGRAM COMPLETE
Area-P 1/2: 3.91 cm2
Height: 74 in
S' Lateral: 3 cm
Weight: 4620.84 oz

## 2021-01-10 LAB — COOXEMETRY PANEL
Carboxyhemoglobin: 1.1 % (ref 0.5–1.5)
Methemoglobin: 0.7 % (ref 0.0–1.5)
O2 Saturation: 74.9 %
Total hemoglobin: 8.4 g/dL — ABNORMAL LOW (ref 12.0–16.0)

## 2021-01-10 LAB — MAGNESIUM: Magnesium: 2.1 mg/dL (ref 1.7–2.4)

## 2021-01-10 LAB — PHOSPHORUS: Phosphorus: 4.2 mg/dL (ref 2.5–4.6)

## 2021-01-10 LAB — LACTIC ACID, PLASMA: Lactic Acid, Venous: 1.9 mmol/L (ref 0.5–1.9)

## 2021-01-10 SURGERY — CREATION, TRACHEOSTOMY
Anesthesia: General

## 2021-01-10 MED ORDER — AMIODARONE HCL 200 MG PO TABS
400.0000 mg | ORAL_TABLET | Freq: Two times a day (BID) | ORAL | Status: DC
  Administered 2021-01-10: 400 mg via NASOGASTRIC
  Filled 2021-01-10: qty 2

## 2021-01-10 MED ORDER — MIDAZOLAM HCL 2 MG/2ML IJ SOLN: 2.0000 mg | Freq: Once | INTRAMUSCULAR | Status: AC

## 2021-01-10 MED ORDER — NOREPINEPHRINE 4 MG/250ML-% IV SOLN: 0.0000 ug/min | INTRAVENOUS | Status: DC

## 2021-01-10 MED ORDER — ALBUMIN HUMAN 5 % IV SOLN
12.5000 g | Freq: Once | INTRAVENOUS | Status: AC
  Administered 2021-01-10: 12.5 g via INTRAVENOUS
  Filled 2021-01-10: qty 250

## 2021-01-10 MED ORDER — NOREPINEPHRINE 4 MG/250ML-% IV SOLN
INTRAVENOUS | Status: AC
  Administered 2021-01-10: 5 ug/min via INTRAVENOUS
  Filled 2021-01-10: qty 250

## 2021-01-10 MED ORDER — SODIUM CHLORIDE 0.9 % IV SOLN
0.0350 mg/kg/h | INTRAVENOUS | Status: AC
  Administered 2021-01-10: 0.04 mg/kg/h via INTRAVENOUS
  Filled 2021-01-10: qty 250

## 2021-01-10 MED ORDER — SODIUM CHLORIDE 0.9 % IV SOLN: INTRAVENOUS | Status: DC | PRN

## 2021-01-10 MED ORDER — DOBUTAMINE IN D5W 4-5 MG/ML-% IV SOLN
INTRAVENOUS | Status: AC
  Filled 2021-01-10: qty 250

## 2021-01-10 MED ORDER — MIDAZOLAM HCL 2 MG/2ML IJ SOLN
INTRAMUSCULAR | Status: AC
  Administered 2021-01-10: 2 mg via INTRAVENOUS
  Filled 2021-01-10: qty 2

## 2021-01-10 MED ORDER — SODIUM CHLORIDE 0.9 % IV SOLN: INTRAVENOUS | Status: DC

## 2021-01-10 MED ORDER — SODIUM CHLORIDE 0.9 % IV SOLN
2.0000 g | Freq: Three times a day (TID) | INTRAVENOUS | Status: DC
  Administered 2021-01-10 – 2021-01-16 (×18): 2 g via INTRAVENOUS
  Filled 2021-01-10 (×19): qty 2000

## 2021-01-10 MED ORDER — PERFLUTREN LIPID MICROSPHERE
1.0000 mL | INTRAVENOUS | Status: AC | PRN
  Administered 2021-01-10: 3 mL via INTRAVENOUS
  Filled 2021-01-10: qty 10

## 2021-01-10 NOTE — Progress Notes (Signed)
Pt HR sustaining in low 40s with decreasing BP/MAP. Contacted Dr. [name] who asked me to contact Dr. Gasper Sells. Dr. Loletha Grayer gave VO for EKG.  EKG completed, awaiting review.

## 2021-01-10 NOTE — Progress Notes (Signed)
Patient ID: Angel [name], male   DOB: Sep 09, 1950, 70 y.o.   MRN: [id number] I D/W Cardiology. Now with elevated co-ox and low dose levo requirement, CRT 2.77. Add some IVF. Resp CX now pseud and enterococcus - will change ABX after D/W Pharmacy.  Georganna Skeans, MD, MPH, FACS Please use [email or web address] to contact on call provider

## 2021-01-10 NOTE — Progress Notes (Addendum)
    Called to the bedside:  1048 patient had new heart rate in the 40s.   EKG Performed:  Sinus bradycardia (significant artifact).  No significant post termination pause.  NIBP (L Leg) dropped to SBP 50s.  Started levophed 5 mcg and turned sedation off.  SBP 168/126.  Restarted fentanyl and precedex for agitation.  NIBP 110/70.  Heart rates 60s sinus.  Labs pending, with Coox, CBC, CMET, troponin, Lactate ordered. Add albumin to am labs for comparison.   Stat bedside echo LVEF is grossly preserved, LV Stroke volume 63 cc, CO 3.8 L/min by Spectral Doppler assessment.  Suspect troponin will be elevated principally from demand, but reasonable to evaluate for etiology of hypotension.  Will start with A line, labs, low dose levophed and cessation of amiodarone.  Discussed with family, pharmd D, nursing team.  Rosaria Ferries PA-C assisting in care.   CRITICAL CARE Performed by: Samiha Denapoli A Elex Mainwaring  Total critical care time: 60 minutes. Critical care time was exclusive of separately billable procedures and treating other patients. Critical care was necessary to treat or prevent imminent or life-threatening deterioration. Critical care was time spent personally by me on the following activities: development of treatment plan with patient and/or surrogate as well as nursing, discussions with consultants, evaluation of patient's response to treatment, examination of patient, obtaining history from patient or surrogate, ordering and performing treatments and interventions, ordering and review of laboratory studies, ordering and review of radiographic studies, pulse oximetry and re-evaluation of patient's condition.    Signed, Rudean Haskell, MD [place]  01/10/2021 2:17 PM    Addendum: Venous Co-ox has returned and is 74 not consistent with cardiogenic shock.  Will continue to follow.  Addendum:  Correct AG 11, Lactate 1.9, Novel Troponin of 52 Worsening  creatinine.  This is worrisome that patient's sepsis may be worsening.  Patient may end up needing temporary insulin drop for glycemia control May need repeat culture/more aggressive PNA strategy  Discussed with nursing team and Dr. [name].  Rudean Haskell, MD [place]  [address] [place], [place] [address] [phone]  3:36 PM

## 2021-01-10 NOTE — Progress Notes (Signed)
[place] for bivalirudin Indication: atrial fibrillation, DVT, PE  No Known Allergies  Patient Measurements: Height: 6\' 2"  (188 cm) Weight: 131 kg (288 lb 12.8 oz) IBW/kg (Calculated) : 82.2 Heparin Dosing Weight: 107kg  Vital Signs: Temp: 98.2 F (36.8 C) (08/25 0800) Temp Source: Axillary (08/25 0800) BP: 142/105 (08/25 0700) Pulse Rate: 128 (08/25 0700)  Labs: Recent Labs    01/08/21 0301 01/08/21 2042 01/09/21 0221 01/09/21 0957 01/10/21 0238 01/10/21 0558  HGB 9.5*  --   --   --   --  7.9*  HCT 31.4*  --   --   --   --  26.6*  PLT 237  --   --   --   --  290  APTT  --  57*  --  62*  --   --   CREATININE 1.28*  --  1.63* 1.85* 2.08*  --      Estimated Creatinine Clearance: 47.5 mL/min (A) (by C-G formula based on SCr of 2.08 mg/dL (H)).   Medical History: Past Medical History:  Diagnosis Date   DM (diabetes mellitus) ([place])    HLD (hyperlipidemia)    Hypertension     Assessment: 56 YOM presenting s/p fall with TBI/SAH and facial fx, in afib started on amiodarone and now cleared per trauma for full dose anticoagulation. Trach/PEG cancelled for 8/23 with patient found to have acute bilateral PE, upper extremity DVT and increased oxygen requirements. Pharmacy consulted to dose bivalirudin per Trauma.  Given patient's clinical status and recent head injury, will watch dosing and signs and symptoms of bleeding closely.   Will aim for middle of therapeutic range aptt of ~65s trying to avoid higher range 70-80s due to recent head injury.  Bivalirudin gtt was held as planned this AM for possible trach/PEG, now with improving neuro status will restart and watch neuro status to evaluate need for trach possibly 8/26.  Noted blood from ET will continue for now at watch, discussed with MD/RN.    Goal of Therapy:  Aptt goal ~50-65s per discussion with Trauma ([place]) Monitor platelets by anticoagulation protocol: Yes   Plan:   Restart bivalirudin at 0.05mg /kg/hr Check aPTT in 4h to confirm Daily aPTT, CBC, s/s bleeding  Bertis Ruddy, PharmD Clinical Pharmacist ED Pharmacist Phone # [phone] 01/10/2021 8:14 AM

## 2021-01-10 NOTE — Progress Notes (Addendum)
Progress Note  Patient Name: Angel [name] Date of Encounter: 01/10/2021  [facility] Cardiologist: Werner Lean, MD new to [place] attempted again today, not successful, associated w/ elevated HR Pt sedated on the vent  Inpatient Medications    Scheduled Meds:  acetaminophen  1,000 mg Per Tube Q6H   bethanechol  25 mg Per Tube TID   chlorhexidine gluconate (MEDLINE KIT)  15 mL Mouth Rinse BID   Chlorhexidine Gluconate Cloth  6 each Topical Q0600   clonazePAM  0.25 mg Per Tube BID   docusate  100 mg Per Tube BID   free water  200 mL Per Tube Q8H   guaiFENesin  10 mL Per Tube Q4H   insulin aspart  0-20 Units Subcutaneous Q4H   insulin aspart  10 Units Subcutaneous Q4H   insulin glargine-yfgn  43 Units Subcutaneous BID   mouth rinse  15 mL Mouth Rinse 10 times per day   methocarbamol  1,000 mg Per Tube Q8H   mupirocin ointment  1 application Nasal BID   pantoprazole sodium  40 mg Per Tube Daily   polyethylene glycol  17 g Per Tube Daily   senna  1 tablet Per Tube Daily   sodium chloride flush  10-40 mL Intracatheter Q12H   Continuous Infusions:  amiodarone 60 mg/hr (01/10/21 0836)   bivalirudin (ANGIOMAX) infusion 0.5 mg/mL (Non-ACS indications) 0.05 mg/kg/hr (01/10/21 0814)   ceFEPime (MAXIPIME) IV 2 g (01/10/21 0901)   dexmedetomidine (PRECEDEX) IV infusion 1.2 mcg/kg/hr (01/10/21 0835)   feeding supplement (PIVOT 1.5 CAL) 1,000 mL (01/10/21 0901)   fentaNYL infusion INTRAVENOUS 175 mcg/hr (01/10/21 0700)   metronidazole 500 mg (01/10/21 0727)   PRN Meds: fentaNYL, labetalol, metoprolol tartrate, ondansetron **OR** ondansetron (ZOFRAN) IV, oxyCODONE, sodium chloride flush, sodium phosphate   Vital Signs    Vitals:   01/10/21 0600 01/10/21 0700 01/10/21 0800 01/10/21 0817  BP: 104/60 (!) 142/105  (!) 185/87  Pulse: 70 (!) 128  (!) 139  Resp: 20 (!) 26  (!) 23  Temp:   98.2 F (36.8 C)   TempSrc:   Axillary   SpO2: 95% 93%   92%  Weight:      Height:        Intake/Output Summary (Last 24 hours) at 01/10/2021 0915 Last data filed at 01/10/2021 0906 Gross per 24 hour  Intake 3674.64 ml  Output 1250 ml  Net 2424.64 ml   Last 3 Weights 01/09/2021 01/08/2021 01/07/2021  Weight (lbs) 288 lb 12.8 oz 287 lb 11.2 oz 294 lb 12.1 oz  Weight (kg) 131 kg 130.5 kg 133.7 kg      Telemetry    AF w/ controlled rate except when stimulated or wean attempted - Personally Reviewed  ECG    No new tracings - Personally Reviewed  Physical Exam   GEN: No acute distress.   Neck: No JVD seen unable to assess 2nd body habitus Cardiac: irreg R&R, no murmur, no rubs, or gallops.  Respiratory: diminished to auscultation bilaterally with rales in the bases. Coarse rhonchi noted as well GI: firm, nontender, distended  MS: 2+ edema; No deformity. Pedal pulses not felt 2nd tight edema, cap refill delayed L Neuro:  sedated on the vent   Labs    High Sensitivity Troponin:  No results for input(s): TROPONINIHS in the last 720 hours.    Chemistry Recent Labs  Lab 01/09/21 0221 01/09/21 0957 01/10/21 0238  NA 145 143 144  K 4.2 3.9 4.4  CL 113* 113* 114*  CO2 21* 23 19*  GLUCOSE 203* 223* 207*  BUN 61* 71* 89*  CREATININE 1.63* 1.85* 2.08*  CALCIUM 8.4* 7.8* 8.0*  GFRNONAA 45* 39* 34*  ANIONGAP _0 Hematology Recent Labs  Lab 01/07/21 0316 01/07/21 0442 01/08/21 0301 01/10/21 0558  WBC 12.1*  --  13.3* 13.8*  RBC 3.94*  --  3.76* 3.14*  HGB 10.0* 9.2* 9.5* 7.9*  HCT 33.1* 27.0* 31.4* 26.6*  MCV 84.0  --  83.5 84.7  MCH 25.4*  --  25.3* 25.2*  MCHC 30.2  --  30.3 29.7*  RDW 16.7*  --  16.7* 17.1*  PLT 248  --  237 290    BNPNo results for input(s): BNP, PROBNP in the last 168 hours.   DDimer No results for input(s): DDIMER in the last 168 hours.   Radiology    CT Angio Chest Pulmonary Embolism (PE) W or WO Contrast  Result Date: 01/08/2021 CLINICAL DATA:  Concern for pulmonary embolism on  preceding abdominal CT performed 01/08/2021 EXAM: CT ANGIOGRAPHY CHEST WITH CONTRAST TECHNIQUE: Multidetector CT imaging of the chest was performed using the standard protocol during bolus administration of intravenous contrast. Multiplanar CT image reconstructions and MIPs were obtained to evaluate the vascular anatomy. CONTRAST:  40m OMNIPAQUE IOHEXOL 350 MG/ML SOLN COMPARISON:  CT abdomen pelvis-01/08/2021 FINDINGS: Vascular Findings: There is adequate opacification of the pulmonary arterial system with the main pulmonary artery measuring 279 Hounsfield units. There is an occlusive filling defect involving the segmental division of the right lower lobe pulmonary artery with associated oligemia of the posterior and medial basilar segments of the right lower lobe (axial image 69, series 5; coronal image 100, series 8). Additionally, there are nonocclusive filling defects within the left lower anteromedial segmental pulmonary artery (axial image 76, series 5; coronal image 102, series 8) as well as the lingular branch of the left pulmonary artery (axial image 65, series 5, coronal image 94, series 8). Overall clot burden is deemed small in volume. There is no CT evidence of intraventricular septal bowing to suggest right-sided heart strain. Normal caliber of the main pulmonary artery. Borderline cardiomegaly. Coronary artery calcifications. No pericardial effusion. No evidence of thoracic aortic aneurysm or dissection. Bovine configuration of the aortic arch. Review of the MIP images confirms the above findings. ---------------------------------------------------------------------------------- Nonvascular Findings: Mediastinum/Lymph Nodes: No bulky mediastinal, hilar axillary lymphadenopathy. Lungs/Pleura: Extensive consolidative opacities and air bronchograms, most severely affecting the bilateral lower lobes as well as the dependent portion of the bilateral upper lobes. This finding is again associated with  relative oligemia involving the posterior basilar segment of the right lower lobe. Endotracheal tube terminates superior to the carina. Rather extensive air bronchograms are seen involving the bilateral lower lobes as well as the posterior aspects of the bilateral upper lobes. Small/trace potentially loculated bilateral pleural effusions, right slightly greater than left. No pneumothorax. Upper abdomen: Limited early arterial phase evaluation of the upper abdomen demonstrates an enteric tube tip terminating at the level of the gastric antrum. There is a minimal amount of high density material seen within the gallbladder, likely vicarious excretion of contrast. Redemonstrated marked distension of the imaged portions of the mid transverse colon. Musculoskeletal: Thyromegaly with suspected approximately 3.6 x 3.6 cm hypoattenuating right-sided thyroid nodule/mass (image 1, series 5). IMPRESSION: 1. Examination is positive for bilateral pulmonary embolism, most significantly affecting the right lower lobe pulmonary artery with associated reduced perfusion of  the posterior and medial basilar segments of the right lower lobe, potentially indicative of infarction. Overall clot burden is deemed small in volume and there is no CT evidence of right-sided heart strain. 2. Extensive bilateral consolidative opacities and associated air bronchograms, most significantly affecting the bilateral lower lobes, worrisome for multifocal infection and/or aspiration. 3. Small/trace potentially partially loculated bilateral effusions, right greater than left. Effusions are currently too small to warrant bedside or ultrasound-guided aspiration. 4. Coronary calcifications.  Aortic Atherosclerosis (ICD10-I70.0). 5. Indeterminate approximately 3.6 cm right-sided thyroid nodule/mass. Further evaluation with nonemergent thyroid ultrasound could be performed as indicated. Above findings discussed with Dr. Bobbye Morton at the time of procedure  completion. Electronically Signed   By: Sandi Mariscal M.D.   On: 01/08/2021 16:31   CT ABDOMEN PELVIS W CONTRAST  Result Date: 01/08/2021 CLINICAL DATA:  Abdominal pain, fever EXAM: CT ABDOMEN AND PELVIS WITH CONTRAST TECHNIQUE: Multidetector CT imaging of the abdomen and pelvis was performed using the standard protocol following bolus administration of intravenous contrast. CONTRAST:  159m OMNIPAQUE IOHEXOL 350 MG/ML SOLN COMPARISON:  12/28/2020 FINDINGS: Lower chest: Interval increase in bilateral pleural effusions, moderate on the right, small on the left. There is near complete atelectasis or consolidation of the right lower lobe and ground-glass airspace clearing in the deep lung base (series 5, image 13). Appearance of the right lower lobar pulmonary arteries is suspicious for embolus, although poorly evaluated on this non tailored examination (series 3, image 3). Coronary artery calcifications. Hepatobiliary: No solid liver abnormality is seen. No gallstones, gallbladder wall thickening, or biliary dilatation. Pancreas: Unremarkable. No pancreatic ductal dilatation or surrounding inflammatory changes. Spleen: Normal in size without significant abnormality. Adrenals/Urinary Tract: Adrenal glands are unremarkable. Simple bilateral renal cysts. Kidneys are otherwise normal, without renal calculi, solid lesion, or hydronephrosis. Bladder is unremarkable. Stomach/Bowel: Stomach is within normal limits. Enteric feeding tube is position with tip in the gastric antrum. Appendix appears normal. The colon is diffusely dilated and fluid-filled to the rectum, the cecum measuring up to 10.2 cm in caliber (series 8, image 80). Vascular/Lymphatic: Aortic atherosclerosis. No enlarged abdominal or pelvic lymph nodes. Reproductive: No mass or other significant abnormality. Other: Anasarca. Small, fat containing bilateral inguinal hernias. Trace ascites throughout the abdomen and pelvis. Musculoskeletal: No acute or  significant osseous findings. IMPRESSION: 1. Interval increase in bilateral pleural effusions, moderate on the right, small on the left. There is near complete atelectasis or consolidation of the right lower lobe and ground-glass airspace clearing in the deep lung base. Appearance of the right lower lobar pulmonary arteries is suspicious for embolus, especially given appearance of the lung parenchyma which is characteristic of pulmonary infarction, although poorly evaluated on this non tailored examination. Consider CT pulmonary angiogram to further evaluate. 2. The colon is diffusely dilated and fluid-filled to the rectum, the cecum measuring up to 10.2 cm in caliber. Findings are consistent with diarrheal illness and potentially consistent with toxic megacolon. 3. Trace ascites throughout the abdomen and pelvis. Anasarca. 4. Coronary artery disease. Aortic Atherosclerosis (ICD10-I70.0). Electronically Signed   By: AEddie CandleM.D.   On: 01/08/2021 12:35   DG Chest Port 1 View  Result Date: 01/09/2021 CLINICAL DATA:  Head injury EXAM: PORTABLE CHEST 1 VIEW, portable abdomen 1 view COMPARISON:  Chest x-ray dated December 18, 2020 FINDINGS: Chest: ETT and enteric feeding tube are unchanged in position. Increased right basilar opacity. Small bilateral pleural effusions. No evidence of pneumothorax. Cardiac and mediastinal contours are unchanged. Abdomen: Enteric feeding tube tip  projects over the expected area of the stomach. Dilated gas-filled loops of predominantly large bowel. Contrast material seen the bladder. IMPRESSION: Feeding tube tip projects over the expected area of the stomach. Increased right basilar opacity, possibly due to worsening atelectasis, infection or aspiration. Dilated gas-filled loops of predominantly large bowel, better evaluated on recent CT. Electronically Signed   By: Yetta Glassman M.D.   On: 01/09/2021 08:18   DG Abd Portable 1V  Result Date: 01/09/2021 CLINICAL DATA:  Head  injury EXAM: PORTABLE CHEST 1 VIEW, portable abdomen 1 view COMPARISON:  Chest x-ray dated December 18, 2020 FINDINGS: Chest: ETT and enteric feeding tube are unchanged in position. Increased right basilar opacity. Small bilateral pleural effusions. No evidence of pneumothorax. Cardiac and mediastinal contours are unchanged. Abdomen: Enteric feeding tube tip projects over the expected area of the stomach. Dilated gas-filled loops of predominantly large bowel. Contrast material seen the bladder. IMPRESSION: Feeding tube tip projects over the expected area of the stomach. Increased right basilar opacity, possibly due to worsening atelectasis, infection or aspiration. Dilated gas-filled loops of predominantly large bowel, better evaluated on recent CT. Electronically Signed   By: Yetta Glassman M.D.   On: 01/09/2021 08:18   VAS [place] UPPER EXTREMITY VENOUS DUPLEX  Result Date: 01/08/2021 UPPER VENOUS STUDY  Patient Name:  Angel [name]  Date of Exam:   01/08/2021 Medical Rec #: [id number]    Accession #:    [id number] Date of Birth: [birth year]    Patient Gender: M Patient Age:   61 years Exam Location:  [facility] Procedure:      VAS [place] UPPER EXTREMITY VENOUS DUPLEX Referring Phys: Reather Laurence --------------------------------------------------------------------------------  Indications: fever Comparison Study: no prior Performing Technologist: Archie Patten RVS  Examination Guidelines: A complete evaluation includes B-mode imaging, spectral Doppler, color Doppler, and power Doppler as needed of all accessible portions of each vessel. Bilateral testing is considered an integral part of a complete examination. Limited examinations for reoccurring indications may be performed as noted.  Right Findings: +----------+------------+---------+-----------+----------+-------+ RIGHT     CompressiblePhasicitySpontaneousPropertiesSummary +----------+------------+---------+-----------+----------+-------+ IJV            Full       Yes       Yes                      +----------+------------+---------+-----------+----------+-------+ Subclavian    Full       Yes       Yes                      +----------+------------+---------+-----------+----------+-------+ Axillary      Full       Yes       Yes                      +----------+------------+---------+-----------+----------+-------+ Brachial      Full       Yes       Yes                      +----------+------------+---------+-----------+----------+-------+ Radial        Full                                          +----------+------------+---------+-----------+----------+-------+ Ulnar         Full                                          +----------+------------+---------+-----------+----------+-------+  Cephalic      Full                                          +----------+------------+---------+-----------+----------+-------+ Basilic       Full                                          +----------+------------+---------+-----------+----------+-------+  Left Findings: +----------+------------+---------+-----------+----------+-----------------+ LEFT      CompressiblePhasicitySpontaneousProperties     Summary      +----------+------------+---------+-----------+----------+-----------------+ IJV           Full       Yes       Yes                                +----------+------------+---------+-----------+----------+-----------------+ Subclavian    Full       Yes       Yes                                +----------+------------+---------+-----------+----------+-----------------+ Axillary      Full       Yes       Yes                                +----------+------------+---------+-----------+----------+-----------------+ Brachial      Full       Yes       Yes                                +----------+------------+---------+-----------+----------+-----------------+ Radial        Full                                                     +----------+------------+---------+-----------+----------+-----------------+ Ulnar         Full                                                    +----------+------------+---------+-----------+----------+-----------------+ Cephalic      None                                  Age Indeterminate +----------+------------+---------+-----------+----------+-----------------+ Basilic       None                                  Age Indeterminate +----------+------------+---------+-----------+----------+-----------------+  Summary:  Right: No evidence of deep vein thrombosis in the upper extremity. No evidence of superficial vein thrombosis in the upper extremity. No evidence of thrombosis in the subclavian.  Left: Findings consistent with age indeterminate superficial vein thrombosis involving the left cephalic vein and left basilic vein.  *See table(s)  above for measurements and observations.  Diagnosing physician: Deitra Mayo MD Electronically signed by Deitra Mayo MD on 01/08/2021 at 2:48:33 PM.    Final    [place] EKG SITE RITE  Result Date: 01/08/2021 If Site Rite image not attached, placement could not be confirmed due to current cardiac rhythm.  US Abdomen Limited RUQ (LIVER/GB)  Result Date: 01/08/2021 CLINICAL DATA:  Acalculous cholecystitis, abdominal pain, fever EXAM: ULTRASOUND ABDOMEN LIMITED RIGHT UPPER QUADRANT COMPARISON:  01/08/2021 FINDINGS: Gallbladder: There are no shadowing gallstones. Mild gallbladder wall thickening measuring up to 5 mm. No pericholecystic fluid. Negative sonographic Murphy sign. Common bile duct: Diameter: 4 mm Liver: No focal lesion identified. Within normal limits in parenchymal echogenicity. Portal vein is patent on color Doppler imaging with normal direction of blood flow towards the liver. Other: Trace free fluid right upper quadrant. IMPRESSION: 1. Trace free fluid right upper quadrant. 2.  Nonspecific gallbladder wall thickening measuring 5 mm. No evidence of gallbladder sludge or cholelithiasis. If acute cholecystitis is a concern, nuclear medicine hepatobiliary scan could be considered. Electronically Signed   By: Randa Ngo M.D.   On: 01/08/2021 20:00    Cardiac Studies   Echo 01/07/21: 1. Left ventricular ejection fraction, by estimation, is 60 to 65%. The  left ventricle has normal function. The left ventricle has no regional  wall motion abnormalities. There is mild left ventricular hypertrophy.  Left ventricular diastolic parameters  are indeterminate.   2. Right ventricule is poorly visualized but grossly normal size and  systolic function   3. Left atrial size was mildly dilated.   4. Right atrial size was mildly dilated.   5. The mitral valve is normal in structure. No evidence of mitral valve  regurgitation. No evidence of mitral stenosis.   6. The aortic valve was not well visualized. Aortic valve regurgitation  is not visualized. No aortic stenosis is present.  CTPE: Date: 01/08/21 Results: A. Bilateral PE B. 3V CAC and Aortic Atherosclerosis C. Multifocal lung consolidation D. Bilateral pleural effusion   Patient Profile     70 y.o. male with a hx of hypertension, diabetes mellitus and hyperlipidemia was admitted 12/28/2020 after fall, w/ TBI/SAH/SDH. Cards saw 01/07/2021 for the evaluation of atrial fibrillation.  Pt hospitalized after falling down 8 stairs and suffering SAH and occipital and temporal bone fractures. Afib noted 01/04/21 and started on amiodarone.  Assessment & Plan    Afib RVR New Pulmonary embolism - on Bival IV and amio IV - can change amio to per tube (ordered discussed with nursing and pharmacy); if RVR worsens will transition back to drip - once final decision is made on trach/peg, discuss if he can be transitioned to DOAC, also ok to give per tube - at this time, SBP drops to 80s and 90s periodically, not sure BP will  tolerate BB/CCB (BP fluctuations with sedation)  SAH -  Mgt per Trauma MD and NS  Respiratory infection Hypoxic respiratory failure - ABX per NS for strep/pseudomonas - ++volume overload w/ I/O +17 L since admit - SBP 80s right now, but had bolus labetalol for elevated BP - Prev given Lasix, but Cr increased and d/c'd - if unable to wean, can consider re-challenge of lasix 60 IV    Aortic Atherosclerosis and Coronary artery calcifications - prevention strategy will be based on recovery; likely addressed as outpatient - LDL goal < 70   For questions or updates, please contact [facility] Please consult [email or web address] for contact info under  Signed, Rosaria Ferries, PA-C  01/10/2021, 9:15 AM    Personally seen and examined. Agree with APP above with changes to the A&P section:  Briefly 70 yo M with a had of SAH found after call complicated by vent wean, PNA, hypoxic respiratory failure, PE, and AF RVR. Patient is intubated and sedated; wakes up with sedation wean but with agitation.  Exam notable for IRIR; notable distended abdomen (persistent dullness to percussion)  Labs notable for worsening kidney function, creatinine 1.6-> 2.08 Personally reviewed relevant tests; Dilated gas loops of bowel noted on CT and AXR abdomen  Recommended (as above): - bivalarudin and amiodarone; attempting to switch to PO given total body volume, AKI, and hypoxic respiratory failure. - issues with residuals or worsening RVR would prompt change back to IV amiodarone - diuretics may be challenging in this situation, but if worsening vent requirements could be attempted  CRITICAL CARE Performed by: Tiona Ruane A Krystian Younglove  Total critical care time: 35 minutes minutes. Critical care time was exclusive of separately billable procedures and treating other patients. Critical care was necessary to treat or prevent imminent or life-threatening deterioration. Critical care was time spent personally  by me on the following activities: development of treatment plan with patient and/or surrogate as well as nursing, discussions with consultants, evaluation of patient's response to treatment, examination of patient, obtaining history from patient or surrogate, ordering and performing treatments and interventions, ordering and review of laboratory studies, ordering and review of radiographic studies, pulse oximetry and re-evaluation of patient's condition. Discussed with patient's wife and son, Pharmacy team and nursing.   Signed, Rudean Haskell, [place]  01/10/2021 11:46 AM

## 2021-01-10 NOTE — Progress Notes (Signed)
Patient ID: Angel [name], male   DOB: August 11, 1950, 70 y.o.   MRN: [id number] Follow up - Trauma Critical Care  Patient Details:    Angel [name] is an 70 y.o. male.  Lines/tubes : Airway 8 mm (Active)  Secured at (cm) 25 cm 01/10/21 0817  Measured From Lips 01/10/21 0817  Secured Location Right 01/10/21 0817  Secured By [employer] 01/10/21 0817  Tube Holder Repositioned Yes 01/10/21 0817  Prone position No 01/10/21 0312  Cuff Pressure (cm H2O) Clear OR 27-39 CmH2O 01/10/21 0817  Site Condition Dry 01/10/21 0817     PICC Triple Lumen 59/45/85 PICC Right Basilic 47 cm 1 cm (Active)  Indication for Insertion or Continuance of Line Limited venous access - need for IV therapy >5 days (PICC only) 01/10/21 0700  Exposed Catheter (cm) 1 cm 01/09/21 0916  Site Assessment Clean;Dry;Intact 01/10/21 0700  Lumen #1 Status Infusing 01/10/21 0700  Lumen #2 Status Infusing 01/10/21 0700  Lumen #3 Status Flushed;Blood return noted 01/10/21 0700  Dressing Type Transparent 01/10/21 0700  Dressing Status Clean;Dry;Intact 01/10/21 0700  Antimicrobial disc in place? Yes 01/10/21 0700  Safety Lock Not Applicable 92/92/44 6286  Line Care Connections checked and tightened;Line pulled back 01/10/21 0700  Dressing Intervention New dressing;Other (Comment) 01/09/21 0916  Dressing Change Due 01/16/21 01/10/21 0700     External Urinary Catheter (Active)  Collection Container Standard drainage bag 01/09/21 2000  Securement Method Tape 01/09/21 0800  Site Assessment Clean;Intact 01/09/21 2000  Output (mL) 50 mL 01/10/21 0600     Fecal Management System (Active)  Does patient meet criteria for removal? No 01/09/21 2000  Daily care Skin around tube assessed 01/09/21 2000  Output (mL) 75 mL 01/10/21 0600  Intake (mL) 30 mL 01/06/21 0520    Microbiology/Sepsis markers: Results for orders placed or performed during the hospital encounter of 12/28/20  Resp Panel by RT-PCR (Flu A&B, Covid)  Nasopharyngeal Swab     Status: None   Collection Time: 12/28/20  4:17 PM   Specimen: Nasopharyngeal Swab; Nasopharyngeal(NP) swabs in vial transport medium  Result Value Ref Range Status   SARS Coronavirus 2 by RT PCR NEGATIVE NEGATIVE Final    Comment: (NOTE) SARS-CoV-2 target nucleic acids are NOT DETECTED.  The SARS-CoV-2 RNA is generally detectable in upper respiratory specimens during the acute phase of infection. The lowest concentration of SARS-CoV-2 viral copies this assay can detect is 138 copies/mL. A negative result does not preclude SARS-Cov-2 infection and should not be used as the sole basis for treatment or other patient management decisions. A negative result may occur with  improper specimen collection/handling, submission of specimen other than nasopharyngeal swab, presence of viral mutation(s) within the areas targeted by this assay, and inadequate number of viral copies(<138 copies/mL). A negative result must be combined with clinical observations, patient history, and epidemiological information. The expected result is Negative.  Fact Sheet for Patients:  EntrepreneurPulse.com.au  Fact Sheet for Healthcare Providers:  IncredibleEmployment.be  This test is no t yet approved or cleared by the [place] FDA and  has been authorized for detection and/or diagnosis of SARS-CoV-2 by FDA under an Emergency Use Authorization (EUA). This EUA will remain  in effect (meaning this test can be used) for the duration of the COVID-19 declaration under Section 564(b)(1) of the Act, 21 U.S.C.section 360bbb-3(b)(1), unless the authorization is terminated  or revoked sooner.       Influenza A by PCR NEGATIVE NEGATIVE Final   Influenza B  by PCR NEGATIVE NEGATIVE Final    Comment: (NOTE) The Xpert Xpress SARS-CoV-2/FLU/RSV plus assay is intended as an aid in the diagnosis of influenza from Nasopharyngeal swab specimens and should not be  used as a sole basis for treatment. Nasal washings and aspirates are unacceptable for Xpert Xpress SARS-CoV-2/FLU/RSV testing.  Fact Sheet for Patients: EntrepreneurPulse.com.au  Fact Sheet for Healthcare Providers: IncredibleEmployment.be  This test is not yet approved or cleared by the [place] FDA and has been authorized for detection and/or diagnosis of SARS-CoV-2 by FDA under an Emergency Use Authorization (EUA). This EUA will remain in effect (meaning this test can be used) for the duration of the COVID-19 declaration under Section 564(b)(1) of the Act, 21 U.S.C. section 360bbb-3(b)(1), unless the authorization is terminated or revoked.  Performed at [facility], [place] [address], [place], [place] [address]   MRSA Next Gen by PCR, Nasal     Status: None   Collection Time: 12/28/20  7:32 PM   Specimen: Nasal Mucosa; Nasal Swab  Result Value Ref Range Status   MRSA by PCR Next Gen NOT DETECTED NOT DETECTED Final    Comment: (NOTE) The GeneXpert MRSA Assay (FDA approved for NASAL specimens only), is one component of a comprehensive MRSA colonization surveillance program. It is not intended to diagnose MRSA infection nor to guide or monitor treatment for MRSA infections. Test performance is not FDA approved in patients less than 57 years old. Performed at [facility], [place] [address] [place], [place] [address]   Culture, Respiratory w Gram Stain     Status: None   Collection Time: 12/31/20 11:06 AM   Specimen: Tracheal Aspirate; Respiratory  Result Value Ref Range Status   Specimen Description TRACHEAL ASPIRATE  Final   Special Requests NONE  Final   Gram Stain   Final    FEW SQUAMOUS EPITHELIAL CELLS PRESENT FEW WBC PRESENT,BOTH PMN AND MONONUCLEAR FEW GRAM POSITIVE COCCI Performed at [facility], [place] [address], [place], [place] [address]    Culture   Final    FEW PSEUDOMONAS AERUGINOSA FEW STREPTOCOCCUS  PNEUMONIAE    Report Status 01/03/2021 FINAL  Final   Organism ID, Bacteria PSEUDOMONAS AERUGINOSA  Final   Organism ID, Bacteria STREPTOCOCCUS PNEUMONIAE  Final      Susceptibility   Pseudomonas aeruginosa - MIC*    CEFTAZIDIME 4 SENSITIVE Sensitive     CIPROFLOXACIN <=0.25 SENSITIVE Sensitive     GENTAMICIN <=1 SENSITIVE Sensitive     IMIPENEM 2 SENSITIVE Sensitive     PIP/TAZO 8 SENSITIVE Sensitive     CEFEPIME 2 SENSITIVE Sensitive     * FEW PSEUDOMONAS AERUGINOSA   Streptococcus pneumoniae - MIC*    ERYTHROMYCIN 4 RESISTANT Resistant     LEVOFLOXACIN 0.5 SENSITIVE Sensitive     VANCOMYCIN <=0.12 SENSITIVE Sensitive     PENO - penicillin <=0.06      PENICILLIN (non-meningitis) <=0.06 SENSITIVE Sensitive     PENICILLIN (oral) <=0.06 SENSITIVE Sensitive     CEFTRIAXONE (non-meningitis) <=0.12 SENSITIVE Sensitive     * FEW STREPTOCOCCUS PNEUMONIAE  Culture, blood (routine x 2)     Status: None   Collection Time: 01/05/21 11:44 AM   Specimen: BLOOD  Result Value Ref Range Status   Specimen Description BLOOD SITE NOT SPECIFIED  Final   Special Requests AEROBIC BOTTLE ONLY Blood Culture adequate volume  Final   Culture   Final    NO GROWTH 5 DAYS Performed at [facility], [place] [address], [place], [place] [address]    Report Status 01/10/2021 FINAL  Final  Culture, blood (routine x 2)     Status: None   Collection Time: 01/05/21 11:44 AM   Specimen: BLOOD  Result Value Ref Range Status   Specimen Description BLOOD SITE NOT SPECIFIED  Final   Special Requests   Final    AEROBIC BOTTLE ONLY Blood Culture results may not be optimal due to an inadequate volume of blood received in culture bottles   Culture   Final    NO GROWTH 5 DAYS Performed at [facility], [place] [address], [place],  [address]    Report Status 01/10/2021 FINAL  Final  Surgical PCR screen     Status: None   Collection Time: 01/08/21 12:14 AM   Specimen: Nasal Mucosa; Nasal Swab  Result  Value Ref Range Status   MRSA, PCR NEGATIVE NEGATIVE Final   Staphylococcus aureus NEGATIVE NEGATIVE Final    Comment: (NOTE) The Xpert SA Assay (FDA approved for NASAL specimens in patients 30 years of age and older), is one component of a comprehensive surveillance program. It is not intended to diagnose infection nor to guide or monitor treatment. Performed at [facility], [place] [address], [place], [place] [address]   Culture, Respiratory w Gram Stain     Status: None (Preliminary result)   Collection Time: 01/08/21  1:24 PM   Specimen: Tracheal Aspirate; Respiratory  Result Value Ref Range Status   Specimen Description TRACHEAL ASPIRATE  Final   Special Requests NONE  Final   Gram Stain   Final    RARE SQUAMOUS EPITHELIAL CELLS PRESENT MODERATE WBC PRESENT, PREDOMINANTLY MONONUCLEAR FEW GRAM NEGATIVE RODS    Culture   Final    RARE PSEUDOMONAS AERUGINOSA CULTURE REINCUBATED FOR BETTER GROWTH SUSCEPTIBILITIES TO FOLLOW    Report Status PENDING  Incomplete   Organism ID, Bacteria PSEUDOMONAS AERUGINOSA  Final      Susceptibility   Pseudomonas aeruginosa - MIC*    CEFTAZIDIME 4 SENSITIVE Sensitive     CIPROFLOXACIN <=0.25 SENSITIVE Sensitive     GENTAMICIN <=1 SENSITIVE Sensitive     IMIPENEM 2 SENSITIVE Sensitive     PIP/TAZO 8 SENSITIVE Sensitive     CEFEPIME Value in next row Sensitive      2 SENSITIVEPerformed at [place] [address], [place], [place] [address]    * RARE PSEUDOMONAS AERUGINOSA  Gastrointestinal Panel by PCR , Stool     Status: None   Collection Time: 01/08/21  5:04 PM   Specimen: Stool  Result Value Ref Range Status   Campylobacter species NOT DETECTED NOT DETECTED Final   Plesimonas shigelloides NOT DETECTED NOT DETECTED Final   Salmonella species NOT DETECTED NOT DETECTED Final   Yersinia enterocolitica NOT DETECTED NOT DETECTED Final   Vibrio species NOT DETECTED NOT DETECTED Final   Vibrio cholerae NOT DETECTED NOT DETECTED  Final   Enteroaggregative E coli (EAEC) NOT DETECTED NOT DETECTED Final   Enteropathogenic E coli (EPEC) NOT DETECTED NOT DETECTED Final   Enterotoxigenic E coli (ETEC) NOT DETECTED NOT DETECTED Final   Shiga like toxin producing E coli (STEC) NOT DETECTED NOT DETECTED Final   Shigella/Enteroinvasive E coli (EIEC) NOT DETECTED NOT DETECTED Final   Cryptosporidium NOT DETECTED NOT DETECTED Final   Cyclospora cayetanensis NOT DETECTED NOT DETECTED Final   Entamoeba histolytica NOT DETECTED NOT DETECTED Final   Giardia lamblia NOT DETECTED  NOT DETECTED Final   Adenovirus F40/41 NOT DETECTED NOT DETECTED Final   Astrovirus NOT DETECTED NOT DETECTED Final   Norovirus GI/GII NOT DETECTED NOT DETECTED Final   Rotavirus A NOT DETECTED NOT DETECTED Final   Sapovirus (I, II, IV, and V) NOT DETECTED NOT DETECTED Final    Comment: Performed at [facility], [place], [place] [address]  C Difficile Quick Screen (NO PCR Reflex)     Status: None   Collection Time: 01/09/21 11:07 AM   Specimen: STOOL  Result Value Ref Range Status   C Diff antigen NEGATIVE NEGATIVE Final   C Diff toxin NEGATIVE NEGATIVE Final   C Diff interpretation No C. difficile detected.  Final    Comment: Performed at [facility], [place] [address] [place], [place] [address]    Anti-infectives:  Anti-infectives (From admission, onward)    Start     Dose/Rate Route Frequency Ordered Stop   01/09/21 2200  ceFEPIme (MAXIPIME) 2 g in sodium chloride 0.9 % 100 mL IVPB        2 g 200 mL/hr over 30 Minutes Intravenous Every 12 hours 01/09/21 1458     01/08/21 1515  metroNIDAZOLE (FLAGYL) IVPB 500 mg        500 mg 100 mL/hr over 60 Minutes Intravenous Every 8 hours 01/08/21 1428     01/03/21 0600  vancomycin (VANCOREADY) IVPB 1250 mg/250 mL  Status:  Discontinued        1,250 mg 166.7 mL/hr over 90 Minutes Intravenous Every 12 hours 01/02/21 1717 01/03/21 0837   01/02/21 1800  vancomycin (VANCOREADY)  IVPB 2000 mg/400 mL        2,000 mg 200 mL/hr over 120 Minutes Intravenous  Once 01/02/21 1712 01/02/21 2007   01/02/21 0900  ceFEPIme (MAXIPIME) 2 g in sodium chloride 0.9 % 100 mL IVPB  Status:  Discontinued        2 g 200 mL/hr over 30 Minutes Intravenous Every 8 hours 01/02/21 0849 01/09/21 1458       Consults: Treatment Team:  Izora Gala, MD    Studies:    Events:  Subjective:    Overnight Issues:   Objective:  Vital signs for last 24 hours: Temp:  [98.2 F (36.8 C)-99.8 F (37.7 C)] 98.2 F (36.8 C) (08/25 0800) Pulse Rate:  [70-140] 92 (08/25 0900) Resp:  [20-30] 24 (08/25 0900) BP: (89-185)/(53-126) 89/62 (08/25 0900) SpO2:  [85 %-97 %] 92 % (08/25 0900) FiO2 (%):  [40 %-50 %] 40 % (08/25 0817)  Hemodynamic parameters for last 24 hours:    Intake/Output from previous day: 08/24 0701 - 08/25 0700 In: 3928.3 [I.V.:1953.2; NG/GT:1475; IV Piggyback:500.2] Out: 1250 [Urine:1075; Stool:175]  Intake/Output this shift: Total I/O In: 324.1 [I.V.:224.1; IV Piggyback:100] Out: -   Vent settings for last 24 hours: Vent Mode: PRVC FiO2 (%):  [40 %-50 %] 40 % Set Rate:  [20 bmp] 20 bmp Vt Set:  [650 mL] 650 mL PEEP:  [5 cmH20] 5 cmH20 Plateau Pressure:  [26 [id number] cmH20] 29 cmH20  Physical Exam:  General: on vent Neuro: regards and F/C HEENT/Neck: ETT Resp: clear to auscultation bilaterally CVS: RRR GI: distended with tympani, NT Extremities: edema 1+  Results for orders placed or performed during the hospital encounter of 12/28/20 (from the past 24 hour(s))  APTT     Status: Abnormal   Collection Time: 01/09/21  9:57 AM  Result Value Ref Range   aPTT 62 (H) 24 -  36 seconds  Magnesium     Status: None   Collection Time: 01/09/21  9:57 AM  Result Value Ref Range   Magnesium 2.2 1.7 - 2.4 mg/dL  Phosphorus     Status: None   Collection Time: 01/09/21  9:57 AM  Result Value Ref Range   Phosphorus 4.1 2.5 - 4.6 mg/dL  Basic metabolic panel      Status: Abnormal   Collection Time: 01/09/21  9:57 AM  Result Value Ref Range   Sodium 143 135 - 145 mmol/L   Potassium 3.9 3.5 - 5.1 mmol/L   Chloride 113 (H) 98 - 111 mmol/L   CO2 23 22 - 32 mmol/L   Glucose, Bld 223 (H) 70 - 99 mg/dL   BUN 71 (H) 8 - 23 mg/dL   Creatinine, Ser 1.85 (H) 0.61 - 1.24 mg/dL   Calcium 7.8 (L) 8.9 - 10.3 mg/dL   GFR, Estimated 39 (L) >60 mL/min   Anion gap 7 5 - 15  C Difficile Quick Screen (NO PCR Reflex)     Status: None   Collection Time: 01/09/21 11:07 AM   Specimen: STOOL  Result Value Ref Range   C Diff antigen NEGATIVE NEGATIVE   C Diff toxin NEGATIVE NEGATIVE   C Diff interpretation No C. difficile detected.   Glucose, capillary     Status: Abnormal   Collection Time: 01/09/21 12:33 PM  Result Value Ref Range   Glucose-Capillary 223 (H) 70 - 99 mg/dL  Glucose, capillary     Status: Abnormal   Collection Time: 01/09/21  4:09 PM  Result Value Ref Range   Glucose-Capillary 244 (H) 70 - 99 mg/dL  Glucose, capillary     Status: Abnormal   Collection Time: 01/09/21  7:43 PM  Result Value Ref Range   Glucose-Capillary 226 (H) 70 - 99 mg/dL  Glucose, capillary     Status: Abnormal   Collection Time: 01/09/21 11:34 PM  Result Value Ref Range   Glucose-Capillary 252 (H) 70 - 99 mg/dL  Basic metabolic panel     Status: Abnormal   Collection Time: 01/10/21  2:38 AM  Result Value Ref Range   Sodium 144 135 - 145 mmol/L   Potassium 4.4 3.5 - 5.1 mmol/L   Chloride 114 (H) 98 - 111 mmol/L   CO2 19 (L) 22 - 32 mmol/L   Glucose, Bld 207 (H) 70 - 99 mg/dL   BUN 89 (H) 8 - 23 mg/dL   Creatinine, Ser 2.08 (H) 0.61 - 1.24 mg/dL   Calcium 8.0 (L) 8.9 - 10.3 mg/dL   GFR, Estimated 34 (L) >60 mL/min   Anion gap 11 5 - 15  Magnesium     Status: None   Collection Time: 01/10/21  2:38 AM  Result Value Ref Range   Magnesium 2.1 1.7 - 2.4 mg/dL  Phosphorus     Status: None   Collection Time: 01/10/21  2:38 AM  Result Value Ref Range   Phosphorus 4.2  2.5 - 4.6 mg/dL  Glucose, capillary     Status: Abnormal   Collection Time: 01/10/21  3:44 AM  Result Value Ref Range   Glucose-Capillary 223 (H) 70 - 99 mg/dL  CBC     Status: Abnormal   Collection Time: 01/10/21  5:58 AM  Result Value Ref Range   WBC 13.8 (H) 4.0 - 10.5 K/uL   RBC 3.14 (L) 4.22 - 5.81 MIL/uL   Hemoglobin 7.9 (L) 13.0 - 17.0 g/dL   HCT 26.6 (  L) 39.0 - 52.0 %   MCV 84.7 80.0 - 100.0 fL   MCH 25.2 (L) 26.0 - 34.0 pg   MCHC 29.7 (L) 30.0 - 36.0 g/dL   RDW 17.1 (H) 11.5 - 15.5 %   Platelets 290 150 - 400 K/uL   nRBC 0.0 0.0 - 0.2 %  Glucose, capillary     Status: None   Collection Time: 01/10/21  7:57 AM  Result Value Ref Range   Glucose-Capillary 95 70 - 99 mg/dL    Assessment & Plan: Present on Admission: **None**    LOS: 13 days   Additional comments:I reviewed the patient's new clinical lab test results. . Fall down stairs 8/12   VDRF - guaifenisen, 40% and PEEP to 5, weaning well. T/P cancelled for trial of extubation ID - resp CX with Strep/Pseudomonas, maxipime day 9, flagyl day 3, UA (-), BCx: pending, NGTD, duplex LE negative. Repeat resp cx with pseud - pending, duplex BUE 8/23 with SVT, CT A/P with ascending colitis and distention. Stool studies and C. dif are all negative TBI/SAH/SDH - NSGY c/s, Dr. Annette Stable, starting to F/C. Significant frontal lobe injuries. Keppra x7d for sz ppx Occipital bone fx - NSGY c/s, Dr. Annette Stable Temporal bone fx extending into middle ear - ENT c/s, Dr. Constance Holster Right TM Rupture - ENT c/s, Dr. Constance Holster AFRVR - amio gtt, appreciate Cardiology F/U Bilateral pulmonary embolism - bival AKI - up to 2.08, albumin x 1 Hx DM2 - resistant SSI, glargine 43u BID (171u/24h) Hx HTN - PRN meds FEN - NPO, cortrak/TF VTE - SCDs, bival gtt Foley - removed Dispo - ICU, trach/PEG cnacelled for trial of extubation next 24h. I met with his wife and son at the bedside and updated them. Critical Care Total Time*: 35 Minutes  Georganna Skeans, MD,  MPH, FACS Trauma & General Surgery Use [email or web address] to contact on call provider  01/10/2021  *Care during the described time interval was provided by me. I have reviewed this patient's available data, including medical history, events of note, physical examination and test results as part of my evaluation.

## 2021-01-10 NOTE — Progress Notes (Signed)
  Echocardiogram 2D Echocardiogram has been performed.  Angel [name] 01/10/2021, 2:31 PM

## 2021-01-10 NOTE — Progress Notes (Signed)
ANTICOAGULATION CONSULT NOTE  Pharmacy Consult for bivalirudin Indication: atrial fibrillation, DVT, PE  No Known Allergies  Patient Measurements: Height: 6\' 2"  (188 cm) Weight: 131 kg (288 lb 12.8 oz) IBW/kg (Calculated) : 82.2 Heparin Dosing Weight: 107kg  Vital Signs: Temp: 98 F (36.7 C) (08/25 1131) Temp Source: Axillary (08/25 1131) BP: 88/72 (08/25 1310) Pulse Rate: 59 (08/25 1310)  Labs: Recent Labs    01/08/21 0301 01/08/21 2042 01/09/21 0221 01/09/21 0957 01/10/21 0238 01/10/21 0558 01/10/21 1146  HGB 9.5*  --   --   --   --  7.9*  --   HCT 31.4*  --   --   --   --  26.6*  --   PLT 237  --   --   --   --  290  --   APTT  --  57*  --  62*  --   --  67*  CREATININE 1.28*  --  1.63* 1.85* 2.08*  --   --      Estimated Creatinine Clearance: 47.5 mL/min (A) (by C-G formula based on SCr of 2.08 mg/dL (H)).   Medical History: Past Medical History:  Diagnosis Date   DM (diabetes mellitus) ([place])    HLD (hyperlipidemia)    Hypertension     Assessment: 69 YOM presenting s/p fall with TBI/SAH and facial fx, in afib started on amiodarone and now cleared per trauma for full dose anticoagulation. Trach/PEG cancelled for 8/23 with patient found to have acute bilateral PE, upper extremity DVT and increased oxygen requirements. Pharmacy consulted to dose bivalirudin per Trauma.  Given patient's clinical status and recent head injury, will watch dosing and signs and symptoms of bleeding closely.   Will aim for middle of therapeutic range aptt of ~65s trying to avoid higher range 70-80s due to recent head injury.  S/p restart aPTT slightly supratherapeutic with lower end goals, ET bleeding decreased since AM  Goal of Therapy:  Aptt goal ~50-65s per discussion with Trauma ([place]) Monitor platelets by anticoagulation protocol: Yes   Plan:  Reduce bivalirudin slightly to 0.04mg /kg/hr Check aPTT in 4h to confirm Daily aPTT, CBC, s/s bleeding  Bertis Ruddy,  PharmD Clinical Pharmacist ED Pharmacist Phone # [phone] 01/10/2021 1:19 PM

## 2021-01-10 NOTE — Progress Notes (Signed)
1300: BP 55/46 (51) Repeat: 67/39 (47) Stopped fentanyl/precedex, increased albumin to 999 mL/hr Called cardiology who gave VO for stat ECHO, levophed gtt, and venous coax. Cardiology arrived on unit- ordered lactate, CBC, CMP, Trop I and add on albumin from morning labs. Discontinue amiodarone per tube. Order A-line (called RRT) Increased fentanyl ceiling to 300 mcg/hr.  Levophed remains at 5 mcg/min with MAP goal 65.  BP 116/65(81). Labs sent to laboratory.

## 2021-01-10 NOTE — Anesthesia Preprocedure Evaluation (Signed)
Anesthesia Evaluation  Patient identified by MRN, date of birth, ID band Patient unresponsive    Reviewed: Allergy & Precautions, H&P , NPO status , Patient's Chart, lab work & pertinent test results  Airway Mallampati: Intubated       Dental   Pulmonary  Intubated for respiratory failure. Multifocal PNA and bilateral PE's   breath sounds clear to auscultation       Cardiovascular hypertension, Pt. on medications + dysrhythmias Atrial Fibrillation  Rhythm:irregular Rate:Normal     Neuro/Psych S/p fall down stairs 8/12.  TBI/SAH. Cranial fx    GI/Hepatic negative GI ROS, Neg liver ROS,   Endo/Other  diabetes, Type 2  Renal/GU Renal InsufficiencyRenal disease     Musculoskeletal   Abdominal   Peds  Hematology  (+) anemia ,   Anesthesia Other Findings   Reproductive/Obstetrics                             Anesthesia Physical  Anesthesia Plan  ASA: 4  Anesthesia Plan: General   Post-op Pain Management:    Induction: Intravenous  PONV Risk Score and Plan: 2 and Ondansetron, Dexamethasone and Treatment may vary due to age or medical condition  Airway Management Planned: Oral ETT and Tracheostomy  Additional Equipment:   Intra-op Plan:   Post-operative Plan: Post-operative intubation/ventilation  Informed Consent: I have reviewed the patients History and Physical, chart, labs and discussed the procedure including the risks, benefits and alternatives for the proposed anesthesia with the patient or authorized representative who has indicated his/her understanding and acceptance.       Plan Discussed with: CRNA, Anesthesiologist and Surgeon  Anesthesia Plan Comments:         Anesthesia Quick Evaluation

## 2021-01-10 NOTE — Progress Notes (Signed)
RT was unaware that patient was placed on CPAP / PS 5/5. Upon entering patient's room patient was having difficulty breathing, decreased SpO2, increased heart rate, diaphoretic. Patient was placed back on full support vent settings. RN aware.

## 2021-01-10 NOTE — Procedures (Signed)
Arterial Catheter Insertion Procedure Note  Angel [name]  [id number]  [birth year]  Date:01/10/21  Time:3:27 PM    Provider Performing: Claretta Fraise    Procedure: Insertion of Arterial Line [phone]) without US guidance  Indication(s) Blood pressure monitoring and/or need for frequent ABGs  Consent Risks of the procedure as well as the alternatives and risks of each were explained to the patient and/or caregiver.  Consent for the procedure was obtained and is signed in the bedside chart  Anesthesia None   Time Out Verified patient identification, verified procedure, site/side was marked, verified correct patient position, special equipment/implants available, medications/allergies/relevant history reviewed, required imaging and test results available.   Sterile Technique Maximal sterile technique including full sterile barrier drape, hand hygiene, sterile gown, sterile gloves, mask, hair covering, sterile ultrasound probe cover (if used).   Procedure Description Area of catheter insertion was cleaned with chlorhexidine and draped in sterile fashion. Without real-time ultrasound guidance an arterial catheter was placed into the right radial artery.  Appropriate arterial tracings confirmed on monitor.     Complications/Tolerance None; patient tolerated the procedure well.   EBL Minimal   Specimen(s) None

## 2021-01-10 NOTE — Progress Notes (Signed)
ANTICOAGULATION CONSULT NOTE  Pharmacy Consult for bivalirudin Indication: atrial fibrillation, DVT, PE  No Known Allergies  Patient Measurements: Height: 6\' 2"  (188 cm) Weight: 131 kg (288 lb 12.8 oz) IBW/kg (Calculated) : 82.2 Heparin Dosing Weight: 107kg  Vital Signs: Temp: 98.8 F (37.1 C) (08/25 2000) Temp Source: Axillary (08/25 2000) BP: 120/77 (08/25 2000) Pulse Rate: 56 (08/25 2000)  Labs: Recent Labs    01/08/21 0301 01/08/21 2042 01/09/21 0957 01/10/21 0238 01/10/21 0558 01/10/21 1146 01/10/21 1346 01/10/21 1518 01/10/21 1811  HGB 9.5*  --   --   --  7.9*  --   --   --   --   HCT 31.4*  --   --   --  26.6*  --   --   --   --   PLT 237  --   --   --  290  --   --   --   --   APTT  --    < > 62*  --   --  67*  --   --  67*  CREATININE 1.28*   < > 1.85* 2.08*  --   --  2.77*  --   --   TROPONINIHS  --   --   --   --   --   --  52* 56*  --    < > = values in this interval not displayed.     Estimated Creatinine Clearance: 35.7 mL/min (A) (by C-G formula based on SCr of 2.77 mg/dL (H)).   Medical History: Past Medical History:  Diagnosis Date   DM (diabetes mellitus) ([place])    HLD (hyperlipidemia)    Hypertension     Assessment: 44 YOM presenting s/p fall with TBI/SAH and facial fx, in afib started on amiodarone and now cleared per trauma for full dose anticoagulation. Trach/PEG cancelled for 8/23 with patient found to have acute bilateral PE, upper extremity DVT and increased oxygen requirements. Pharmacy consulted to dose bivalirudin per Trauma.  Given patient's clinical status and recent head injury, will watch dosing and signs and symptoms of bleeding closely.   Will aim for middle of therapeutic range aptt of ~65s trying to avoid higher range 70-80s due to recent head injury.  Heparin level this evening remains at 67.  No overt bleeding or complications noted.  Goal of Therapy:  Aptt goal ~50-65s per discussion with Trauma ([place]) Monitor  platelets by anticoagulation protocol: Yes   Plan:  Continue bivalirudin 0.04mg /kg/hr Daily aPTT, CBC, s/s bleeding  [place] Crane, Roylene Reason, [facility] Clinical Pharmacist  01/10/2021 8:38 PM   [facility] pharmacy phone numbers are listed on [email or web address]

## 2021-01-11 ENCOUNTER — Inpatient Hospital Stay (HOSPITAL_COMMUNITY): Payer: PPO | Admitting: Certified Registered"

## 2021-01-11 ENCOUNTER — Inpatient Hospital Stay (HOSPITAL_COMMUNITY): Payer: PPO

## 2021-01-11 DIAGNOSIS — I152 Hypertension secondary to endocrine disorders: Secondary | ICD-10-CM | POA: Diagnosis not present

## 2021-01-11 DIAGNOSIS — R579 Shock, unspecified: Secondary | ICD-10-CM | POA: Diagnosis not present

## 2021-01-11 DIAGNOSIS — I609 Nontraumatic subarachnoid hemorrhage, unspecified: Secondary | ICD-10-CM | POA: Diagnosis not present

## 2021-01-11 DIAGNOSIS — Z4682 Encounter for fitting and adjustment of non-vascular catheter: Secondary | ICD-10-CM | POA: Diagnosis not present

## 2021-01-11 DIAGNOSIS — I4819 Other persistent atrial fibrillation: Secondary | ICD-10-CM | POA: Diagnosis not present

## 2021-01-11 DIAGNOSIS — I2699 Other pulmonary embolism without acute cor pulmonale: Secondary | ICD-10-CM | POA: Diagnosis not present

## 2021-01-11 DIAGNOSIS — I480 Paroxysmal atrial fibrillation: Secondary | ICD-10-CM | POA: Diagnosis not present

## 2021-01-11 DIAGNOSIS — A419 Sepsis, unspecified organism: Secondary | ICD-10-CM

## 2021-01-11 DIAGNOSIS — I517 Cardiomegaly: Secondary | ICD-10-CM | POA: Diagnosis not present

## 2021-01-11 DIAGNOSIS — E1159 Type 2 diabetes mellitus with other circulatory complications: Secondary | ICD-10-CM | POA: Diagnosis not present

## 2021-01-11 DIAGNOSIS — I700 Atherosclerosis of aorta: Secondary | ICD-10-CM | POA: Diagnosis not present

## 2021-01-11 DIAGNOSIS — I4891 Unspecified atrial fibrillation: Secondary | ICD-10-CM | POA: Diagnosis not present

## 2021-01-11 LAB — GLUCOSE, CAPILLARY
Glucose-Capillary: 113 mg/dL — ABNORMAL HIGH (ref 70–99)
Glucose-Capillary: 115 mg/dL — ABNORMAL HIGH (ref 70–99)
Glucose-Capillary: 120 mg/dL — ABNORMAL HIGH (ref 70–99)
Glucose-Capillary: 63 mg/dL — ABNORMAL LOW (ref 70–99)
Glucose-Capillary: 69 mg/dL — ABNORMAL LOW (ref 70–99)
Glucose-Capillary: 72 mg/dL (ref 70–99)
Glucose-Capillary: 86 mg/dL (ref 70–99)

## 2021-01-11 LAB — CULTURE, RESPIRATORY W GRAM STAIN

## 2021-01-11 LAB — CBC
HCT: 26.1 % — ABNORMAL LOW (ref 39.0–52.0)
Hemoglobin: 8 g/dL — ABNORMAL LOW (ref 13.0–17.0)
MCH: 25.2 pg — ABNORMAL LOW (ref 26.0–34.0)
MCHC: 30.7 g/dL (ref 30.0–36.0)
MCV: 82.3 fL (ref 80.0–100.0)
Platelets: 396 10*3/uL (ref 150–400)
RBC: 3.17 MIL/uL — ABNORMAL LOW (ref 4.22–5.81)
RDW: 17.3 % — ABNORMAL HIGH (ref 11.5–15.5)
WBC: 14.6 10*3/uL — ABNORMAL HIGH (ref 4.0–10.5)
nRBC: 0.1 % (ref 0.0–0.2)

## 2021-01-11 LAB — BASIC METABOLIC PANEL
Anion gap: 12 (ref 5–15)
BUN: 112 mg/dL — ABNORMAL HIGH (ref 8–23)
CO2: 19 mmol/L — ABNORMAL LOW (ref 22–32)
Calcium: 8.2 mg/dL — ABNORMAL LOW (ref 8.9–10.3)
Chloride: 115 mmol/L — ABNORMAL HIGH (ref 98–111)
Creatinine, Ser: 3.43 mg/dL — ABNORMAL HIGH (ref 0.61–1.24)
GFR, Estimated: 18 mL/min — ABNORMAL LOW (ref >60)
Glucose, Bld: 119 mg/dL — ABNORMAL HIGH (ref 70–99)
Potassium: 3.2 mmol/L — ABNORMAL LOW (ref 3.5–5.1)
Sodium: 146 mmol/L — ABNORMAL HIGH (ref 135–145)

## 2021-01-11 LAB — APTT
aPTT: 74 s — ABNORMAL HIGH (ref 24–36)
aPTT: 46 s — ABNORMAL HIGH (ref 24–36)
aPTT: 52 s — ABNORMAL HIGH (ref 24–36)

## 2021-01-11 LAB — MAGNESIUM: Magnesium: 2.2 mg/dL (ref 1.7–2.4)

## 2021-01-11 LAB — PHOSPHORUS: Phosphorus: 5.4 mg/dL — ABNORMAL HIGH (ref 2.5–4.6)

## 2021-01-11 MED ORDER — FENTANYL 2500MCG IN NS 250ML (10MCG/ML) PREMIX INFUSION
25.0000 ug/h | INTRAVENOUS | Status: DC
  Administered 2021-01-11: 100 ug/h via INTRAVENOUS
  Administered 2021-01-12: 200 ug/h via INTRAVENOUS
  Administered 2021-01-12: 150 ug/h via INTRAVENOUS
  Administered 2021-01-13: 200 ug/h via INTRAVENOUS
  Administered 2021-01-14: 250 ug/h via INTRAVENOUS
  Administered 2021-01-14 – 2021-01-15 (×2): 150 ug/h via INTRAVENOUS
  Filled 2021-01-11 (×9): qty 250

## 2021-01-11 MED ORDER — POTASSIUM CHLORIDE 10 MEQ/100ML IV SOLN
10.0000 meq | Freq: Once | INTRAVENOUS | Status: AC
  Administered 2021-01-11: 10 meq via INTRAVENOUS
  Filled 2021-01-11: qty 100

## 2021-01-11 MED ORDER — CLEVIDIPINE BUTYRATE 0.5 MG/ML IV EMUL
0.0000 mg/h | INTRAVENOUS | Status: DC
  Administered 2021-01-11: 1 mg/h via INTRAVENOUS

## 2021-01-11 MED ORDER — SUCCINYLCHOLINE CHLORIDE 200 MG/10ML IV SOSY
PREFILLED_SYRINGE | INTRAVENOUS | Status: AC | PRN
  Administered 2021-01-11: 140 mg via INTRAVENOUS

## 2021-01-11 MED ORDER — LIDOCAINE HCL (CARDIAC) PF 100 MG/5ML IV SOSY
PREFILLED_SYRINGE | INTRAVENOUS | Status: AC | PRN
  Administered 2021-01-11: 100 mg via INTRAVENOUS

## 2021-01-11 MED ORDER — ALBUMIN HUMAN 5 % IV SOLN
25.0000 g | Freq: Once | INTRAVENOUS | Status: AC
  Administered 2021-01-11: 25 g via INTRAVENOUS
  Filled 2021-01-11: qty 500

## 2021-01-11 MED ORDER — ETOMIDATE 2 MG/ML IV SOLN
INTRAVENOUS | Status: DC | PRN
  Administered 2021-01-11: 12 mg via INTRAVENOUS

## 2021-01-11 MED ORDER — FENTANYL CITRATE (PF) 100 MCG/2ML IJ SOLN: 25.0000 ug | Freq: Once | INTRAMUSCULAR | Status: DC

## 2021-01-11 MED ORDER — CLEVIDIPINE BUTYRATE 0.5 MG/ML IV EMUL
INTRAVENOUS | Status: AC
  Filled 2021-01-11: qty 100

## 2021-01-11 MED ORDER — HYDRALAZINE HCL 20 MG/ML IJ SOLN
10.0000 mg | INTRAMUSCULAR | Status: DC | PRN
  Administered 2021-01-11: 10 mg via INTRAVENOUS
  Filled 2021-01-11: qty 1

## 2021-01-11 MED ORDER — DEXTROSE 50 % IV SOLN
12.5000 g | Freq: Once | INTRAVENOUS | Status: AC
  Administered 2021-01-11: 12.5 g via INTRAVENOUS
  Filled 2021-01-11: qty 50

## 2021-01-11 MED ORDER — DEXMEDETOMIDINE HCL IN NACL 400 MCG/100ML IV SOLN
0.0000 ug/kg/h | INTRAVENOUS | Status: AC
  Administered 2021-01-11 (×2): 0.2 ug/kg/h via INTRAVENOUS
  Administered 2021-01-11: 0.4 ug/kg/h via INTRAVENOUS
  Filled 2021-01-11: qty 100

## 2021-01-11 MED ORDER — DEXTROSE 50 % IV SOLN
12.5000 g | INTRAVENOUS | Status: AC
  Administered 2021-01-11: 12.5 g via INTRAVENOUS
  Filled 2021-01-11: qty 50

## 2021-01-11 MED ORDER — AMIODARONE HCL 200 MG PO TABS
200.0000 mg | ORAL_TABLET | Freq: Every day | ORAL | Status: DC
  Administered 2021-01-12 – 2021-01-13 (×2): 200 mg
  Filled 2021-01-11 (×2): qty 1

## 2021-01-11 MED ORDER — SODIUM CHLORIDE 0.9 % IV SOLN
0.0365 mg/kg/h | INTRAVENOUS | Status: DC
  Administered 2021-01-12: 0.0385 mg/kg/h via INTRAVENOUS
  Filled 2021-01-11 (×2): qty 250

## 2021-01-11 MED ORDER — SODIUM CHLORIDE 0.9 % IV SOLN
2.0000 g | INTRAVENOUS | Status: DC
  Administered 2021-01-12 – 2021-01-15 (×5): 2 g via INTRAVENOUS
  Filled 2021-01-11 (×4): qty 2

## 2021-01-11 MED ORDER — FENTANYL BOLUS VIA INFUSION
25.0000 ug | INTRAVENOUS | Status: DC | PRN
  Administered 2021-01-11: 100 ug via INTRAVENOUS
  Administered 2021-01-13 – 2021-01-14 (×2): 50 ug via INTRAVENOUS
  Administered 2021-01-14: 100 ug via INTRAVENOUS
  Filled 2021-01-11: qty 100

## 2021-01-11 MED ORDER — FENTANYL CITRATE (PF) 100 MCG/2ML IJ SOLN
50.0000 ug | INTRAMUSCULAR | Status: DC | PRN
  Administered 2021-01-11: 50 ug via INTRAVENOUS
  Filled 2021-01-11 (×2): qty 2

## 2021-01-11 NOTE — Progress Notes (Signed)
Patient's blood sugar is 63. Text paged Dr. Bobbye Morton and initiated hypoglycemia protocol.

## 2021-01-11 NOTE — Progress Notes (Signed)
Nutrition Follow-up  DOCUMENTATION CODES:   Obesity unspecified  INTERVENTION:   As able recommend trickle TF Pivot 1.5 @ 20 ml/hr  Goal: Pivot 1.5 at 65 ml/h (1560 ml per day) Provides 2340 kcal, 146 gm protein, 1184 ml free water daily  NUTRITION DIAGNOSIS:   Inadequate oral intake related to inability to eat as evidenced by NPO status. Ongoing.   GOAL:   Patient will meet greater than or equal to 90% of their needs Met with TF.   MONITOR:   TF tolerance  REASON FOR ASSESSMENT:   Consult, Ventilator Enteral/tube feeding initiation and management  ASSESSMENT:   Pt with PMH of DM and HTN admitted after falling down basement stairs with TBI/SAH/SDH, significant frontal lobe injuries, occipital bone fx, temporal bone fx extending into middle ear, and R TM rupture.    Pt discussed during ICU rounds and with RN.  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  OG removed at time of extubation and per RN cortrak found coiled in pt's mouth which was removed.  Per trauma not tolerating extubation, anesthesia to reintubate.    8/15 s/p cortrak placement; tip gastric 8/25 pt vomited, OG placed with 650 ml out, abd tight  8/26 extubated but required re-intubation 2 hours later   Medications reviewed and include: colace, SSI, 10 units novolog every 4 hours, 43 units semglee BID, protonix, miralax, senna 10 mEq KCl x 1 8/26  Labs reviewed: Na: 146, K+3.2, BUN 112, Cr: 3.43 CBG's: 205-211 (on TF) 72-113 off TF   UOP: 750 ml  I&O: +20 L Moderate edema    Diet Order:   Diet Order             Diet NPO time specified  Diet effective now                   EDUCATION NEEDS:   Not appropriate for education at this time  Skin:  Skin Assessment: Reviewed RN Assessment  Last BM:  850 ml rectal tube  Height:   Ht Readings from Last 1 Encounters:  12/28/20 6' 2" (1.88 m)    Weight:   Wt Readings from Last 1 Encounters:  01/09/21 131 kg     BMI:  Body mass index is 37.08 kg/m.  Estimated Nutritional Needs:   Kcal:  2200-2600  Protein:  135-150 grams  Fluid:  > 2 L/day  Lockie Pares., RD, LDN, CNSC See AMiON for contact information

## 2021-01-11 NOTE — Progress Notes (Signed)
Patient ID: Angel [name], male   DOB: 14-Apr-1951, 70 y.o.   MRN: [id number] Extubated and doing OK so far. I spoke with his wife and son at the bedside.  Georganna Skeans, MD, MPH, FACS Please use [email or web address] to contact on call provider

## 2021-01-11 NOTE — Progress Notes (Signed)
Hypoglycemic Event  CBG: 69  Treatment: D50 25 mL (12.5 gm)  Symptoms: None  Follow-up CBG: Time:1635 CBG Result:115   Possible Reasons for Event: Inadequate meal intake     Girard Cooter

## 2021-01-11 NOTE — Progress Notes (Signed)
[place] for bivalirudin Indication: atrial fibrillation, DVT, PE  No Known Allergies  Patient Measurements: Height: 6\' 2"  (188 cm) Weight: 131 kg (288 lb 12.8 oz) IBW/kg (Calculated) : 82.2 Heparin Dosing Weight: 107kg  Vital Signs: Temp: 98.2 F (36.8 C) (08/26 0400) Temp Source: Axillary (08/26 0400) BP: 131/71 (08/26 0700) Pulse Rate: 45 (08/26 0700)  Labs: Recent Labs    01/10/21 0238 01/10/21 0558 01/10/21 1146 01/10/21 1346 01/10/21 1518 01/10/21 1811 01/11/21 0603  HGB  --  7.9*  --   --   --   --  8.0*  HCT  --  26.6*  --   --   --   --  26.1*  PLT  --  290  --   --   --   --  396  APTT  --   --  67*  --   --  67* 74*  CREATININE 2.08*  --   --  2.77*  --   --  3.43*  TROPONINIHS  --   --   --  52* 56*  --   --      Estimated Creatinine Clearance: 28.8 mL/min (A) (by C-G formula based on SCr of 3.43 mg/dL (H)).   Medical History: Past Medical History:  Diagnosis Date   DM (diabetes mellitus) ([place])    HLD (hyperlipidemia)    Hypertension     Assessment: 38 YOM presenting s/p fall with TBI/SAH and facial fx, in afib started on amiodarone and now cleared per trauma for full dose anticoagulation. Trach/PEG cancelled for 8/23 with patient found to have acute bilateral PE, upper extremity DVT and increased oxygen requirements. Pharmacy consulted to dose bivalirudin per Trauma.  Given patient's clinical status and recent head injury, will watch dosing and signs and symptoms of bleeding closely.   Will aim for middle of therapeutic range aptt of ~65s trying to avoid higher range 70-80s due to recent head injury.  AM aPTT supratherapeutic with low end goals at 74 seconds  Goal of Therapy:  Aptt goal ~50-65s per discussion with Trauma ([place]) Monitor platelets by anticoagulation protocol: Yes   Plan:  Decrease bivalirudin to 0.03mg /kg/hr Check aPTT in 4h to confirm Daily aPTT, CBC, s/s bleeding  Bertis Ruddy,  PharmD Clinical Pharmacist ED Pharmacist Phone # [phone] 01/11/2021 7:11 AM

## 2021-01-11 NOTE — Progress Notes (Addendum)
Patient had an episode of emesis at 2300. Stopped tube feeds, paged Trauma. Trauma agreed with the plan to place OG and do a stat Chest X-ray. MD advised to keep tube feeds off until in the morning.

## 2021-01-11 NOTE — Anesthesia Procedure Notes (Signed)
Procedure Name: Intubation Date/Time: 01/11/2021 2:40 PM Performed by: Griffin Dakin, CRNA Pre-anesthesia Checklist: Patient identified, Emergency Drugs available, Suction available, Patient being monitored and Timeout performed Patient Re-evaluated:Patient Re-evaluated prior to induction Oxygen Delivery Method: Ambu bag Preoxygenation: Pre-oxygenation with 100% oxygen Induction Type: IV induction Ventilation: Two handed mask ventilation required Laryngoscope Size: Glidescope and 4 Grade View: Grade II Tube size: 7.5 mm Airway Equipment and Method: Rigid stylet and Video-laryngoscopy Placement Confirmation: positive ETCO2 and breath sounds checked- equal and bilateral Secured at: 24 cm Tube secured with: Tape Dental Injury: Teeth and Oropharynx as per pre-operative assessment

## 2021-01-11 NOTE — Procedures (Signed)
Extubation Procedure Note  Patient Details:   Name: Angel [name] DOB: 1951/04/03 MRN: [id number]   Airway Documentation:    Vent end date: 01/11/21 Vent end time: 1235   Evaluation  O2 sats: stable throughout Complications: No apparent complications Patient did tolerate procedure well. Bilateral Breath Sounds: Clear, Diminished   No  Patient was extubated to a 6L [place] with RN & MD at the bedside without any complications.   Claretta Fraise 01/11/2021, 12:35 PM

## 2021-01-11 NOTE — Progress Notes (Addendum)
Progress Note  Patient Name: Angel [name] Date of Encounter: 01/11/2021  [facility] Cardiologist: Werner Lean, MD new to [place], on albumin, IVF at 100 cc/hr, precedex decreased, still on fentanyl Tube feeds turned off and pt suctioned after he vomited, 650 cc tube feeds came out.  Eyes are open, lifts arms but does not follow other commands  Inpatient Medications    Scheduled Meds:  acetaminophen  1,000 mg Per Tube Q6H   bethanechol  25 mg Per Tube TID   chlorhexidine gluconate (MEDLINE KIT)  15 mL Mouth Rinse BID   Chlorhexidine Gluconate Cloth  6 each Topical Q0600   clonazePAM  0.25 mg Per Tube BID   docusate  100 mg Per Tube BID   free water  200 mL Per Tube Q8H   guaiFENesin  10 mL Per Tube Q4H   insulin aspart  0-20 Units Subcutaneous Q4H   insulin aspart  10 Units Subcutaneous Q4H   insulin glargine-yfgn  43 Units Subcutaneous BID   mouth rinse  15 mL Mouth Rinse 10 times per day   methocarbamol  1,000 mg Per Tube Q8H   mupirocin ointment  1 application Nasal BID   pantoprazole sodium  40 mg Per Tube Daily   polyethylene glycol  17 g Per Tube Daily   senna  1 tablet Per Tube Daily   sodium chloride flush  10-40 mL Intracatheter Q12H   Continuous Infusions:  sodium chloride     sodium chloride 100 mL/hr at 01/11/21 0900   ampicillin (OMNIPEN) IV Stopped (01/11/21 4801)   bivalirudin (ANGIOMAX) infusion 0.5 mg/mL (Non-ACS indications) 0.0299 mg/kg/hr (01/11/21 0900)   ceFEPime (MAXIPIME) IV     dexmedetomidine (PRECEDEX) IV infusion 0.4 mcg/kg/hr (01/11/21 0900)   feeding supplement (PIVOT 1.5 CAL) Stopped (01/10/21 2300)   fentaNYL infusion INTRAVENOUS 175 mcg/hr (01/11/21 0900)   norepinephrine (LEVOPHED) Adult infusion 2 mcg/min (01/11/21 0900)   PRN Meds: Place/Maintain arterial line **AND** sodium chloride, fentaNYL, labetalol, metoprolol tartrate, ondansetron **OR** ondansetron (ZOFRAN) IV, oxyCODONE, sodium  chloride flush, sodium phosphate   Vital Signs    Vitals:   01/11/21 0829 01/11/21 0900 01/11/21 1038 01/11/21 1039  BP: (!) 157/74 (!) 164/59 134/71   Pulse: 66 66 60   Resp: 15 (!) 22 (!) 27   Temp:      TempSrc:      SpO2: 92% 93% 93% 93%  Weight:      Height:        Intake/Output Summary (Last 24 hours) at 01/11/2021 1047 Last data filed at 01/11/2021 0900 Gross per 24 hour  Intake 4994.94 ml  Output 2525 ml  Net 2469.94 ml   Last 3 Weights 01/09/2021 01/08/2021 01/07/2021  Weight (lbs) 288 lb 12.8 oz 287 lb 11.2 oz 294 lb 12.1 oz  Weight (kg) 131 kg 130.5 kg 133.7 kg      Telemetry    Converted to SR!!, Sinus brady overnight w/ HR high 40s, 5 bt SVT - Personally Reviewed  ECG    No new tracings - Personally Reviewed  Physical Exam   General: Well developed, well nourished, male mildly sedated on the vent Head: Eyes PERRLA, Head normocephalic and atraumatic Lungs: clear anteriorly to auscultation. Heart: HRRR S1 S2, without rub or gallop. No murmur. upper extremity pulses are 2+ & equal. No JVD seen 2nd body habitus. Abdomen: Bowel sounds are minimal, abdomen firm and distended, dull to percussion Msk: Not able to  assess strength and tone  Extremities: No clubbing, cyanosis, 2+ edema.    Skin:  No rashes or lesions noted. Neuro: opens eyes, seems to see, does not turn head  Labs    High Sensitivity Troponin:   Recent Labs  Lab 01/10/21 1346 01/10/21 1518  TROPONINIHS 52* 56*      Chemistry Recent Labs  Lab 01/10/21 0238 01/10/21 1346 01/11/21 0603  NA 144 141 146*  K 4.4 3.5 3.2*  CL 114* 111 115*  CO2 19* 20* 19*  GLUCOSE 207* 317* 119*  BUN 89* 94* 112*  CREATININE 2.08* 2.77* 3.43*  CALCIUM 8.0* 7.9* 8.2*  PROT  --  5.7*  --   ALBUMIN  --  1.7*  --   AST  --  30  --   ALT  --  34  --   ALKPHOS  --  53  --   BILITOT  --  0.4  --   GFRNONAA 34* 24* 18*  ANIONGAP 11 10 12      Hematology Recent Labs  Lab 01/08/21 0301 01/10/21 0558  01/11/21 0603  WBC 13.3* 13.8* 14.6*  RBC 3.76* 3.14* 3.17*  HGB 9.5* 7.9* 8.0*  HCT 31.4* 26.6* 26.1*  MCV 83.5 84.7 82.3  MCH 25.3* 25.2* 25.2*  MCHC 30.3 29.7* 30.7  RDW 16.7* 17.1* 17.3*  PLT 237 290 396    BNPNo results for input(s): BNP, PROBNP in the last 168 hours.   DDimer No results for input(s): DDIMER in the last 168 hours.   Radiology    DG CHEST PORT 1 VIEW  Result Date: 01/10/2021 CLINICAL DATA:  Emesis EXAM: PORTABLE CHEST 1 VIEW COMPARISON:  01/09/2021 FINDINGS: Endotracheal tube and gastric catheter are noted in satisfactory position. Feeding catheter extends into the stomach as well. Right-sided PICC line is in satisfactory position. Cardiac shadow is enlarged but accentuated by the frontal technique. Some improved aeration is noted in the right base although small right-sided effusion remains. No bony abnormality is seen. IMPRESSION: Tubes and lines in satisfactory position. Slight improved aeration in the right base although right-sided effusion remains. Electronically Signed   By: Inez Catalina M.D.   On: 01/10/2021 23:40   ECHOCARDIOGRAM COMPLETE  Result Date: 01/10/2021    ECHOCARDIOGRAM REPORT   Patient Name:   Angel [name] Date of Exam: 01/10/2021 Medical Rec #:  [id number]   Height:       74.0 in Accession #:    [id number]  Weight:       288.8 lb Date of Birth:  11-16-1950   BSA:          2.541 m Patient Age:    70 years    BP:           113/58 mmHg Patient Gender: M           HR:           63 bpm. Exam Location:  Inpatient Procedure: 2D Echo, Cardiac Doppler, Color Doppler and Intracardiac            Opacification Agent                        STAT ECHO Reported to: Dr. Kirk Ruths on 01/10/2021 2:14:00 PM. Indications:    R94.31 Abnormal EKG  History:        Patient has prior history of Echocardiogram examinations, most  recent 01/07/2021. Risk Factors:Hypertension, Diabetes and                 Dyslipidemia.  Sonographer:    Jonelle Sidle Dance RVT Referring  Phys: [id number] [facility] A Matan Steen  Sonographer Comments: Echo performed with patient supine and on artificial respirator, Technically difficult study due to poor echo windows and no subcostal window. IMPRESSIONS  1. Left ventricular ejection fraction, by estimation, is 60 to 65%. The left ventricle has normal function. The left ventricle has no regional wall motion abnormalities. There is mild left ventricular hypertrophy. Left ventricular diastolic parameters were normal.  2. Right ventricular systolic function is mildly reduced. The right ventricular size is not well visualized.  3. The mitral valve is normal in structure. Trivial mitral valve regurgitation. No evidence of mitral stenosis.  4. The aortic valve is tricuspid. Aortic valve regurgitation is not visualized. Mild aortic valve sclerosis is present, with no evidence of aortic valve stenosis. FINDINGS  Left Ventricle: Left ventricular ejection fraction, by estimation, is 60 to 65%. The left ventricle has normal function. The left ventricle has no regional wall motion abnormalities. Definity contrast agent was given IV to delineate the left ventricular  endocardial borders. The left ventricular internal cavity size was normal in size. There is mild left ventricular hypertrophy. Left ventricular diastolic parameters were normal. Right Ventricle: The right ventricular size is not well visualized. Right vetricular wall thickness was not well visualized. Right ventricular systolic function is mildly reduced. Left Atrium: Left atrial size was normal in size. Right Atrium: Right atrial size was not well visualized. Pericardium: There is no evidence of pericardial effusion. Mitral Valve: The mitral valve is normal in structure. Mild mitral annular calcification. Trivial mitral valve regurgitation. No evidence of mitral valve stenosis. Tricuspid Valve: The tricuspid valve is normal in structure. Tricuspid valve regurgitation is trivial. No evidence of tricuspid  stenosis. Aortic Valve: The aortic valve is tricuspid. Aortic valve regurgitation is not visualized. Mild aortic valve sclerosis is present, with no evidence of aortic valve stenosis. Pulmonic Valve: The pulmonic valve was not well visualized. Pulmonic valve regurgitation is not visualized. No evidence of pulmonic stenosis. Aorta: The aortic root is normal in size and structure. Venous: The inferior vena cava was not well visualized. IAS/Shunts: The interatrial septum was not well visualized.  LEFT VENTRICLE PLAX 2D LVIDd:         4.60 cm  Diastology LVIDs:         3.00 cm  LV e' medial:    9.36 cm/s LV PW:         1.50 cm  LV E/e' medial:  11.3 LV IVS:        1.10 cm  LV e' lateral:   9.36 cm/s LVOT diam:     1.80 cm  LV E/e' lateral: 11.3 LV SV:         50 LV SV Index:   20 LVOT Area:     2.54 cm  RIGHT VENTRICLE TAPSE (M-mode): 2.4 cm LEFT ATRIUM             Index LA diam:        4.40 cm 1.73 cm/m LA Vol (A2C):   87.6 ml 34.48 ml/m LA Vol (A4C):   43.2 ml 17.00 ml/m LA Biplane Vol: 63.4 ml 24.95 ml/m  AORTIC VALVE LVOT Vmax:   106.50 cm/s LVOT Vmean:  70.300 cm/s LVOT VTI:    0.195 m  AORTA Ao Root diam: 3.30 cm Ao Asc diam:  2.50 cm MITRAL VALVE MV Area (PHT): 3.91 cm     SHUNTS MV Decel Time: 194 msec     Systemic VTI:  0.20 m MV E velocity: 106.00 cm/s  Systemic Diam: 1.80 cm MV A velocity: 61.20 cm/s MV E/A ratio:  1.73 Kirk Ruths MD Electronically signed by Kirk Ruths MD Signature Date/Time: 01/10/2021/2:29:20 PM    Final     Cardiac Studies   Echo 01/07/21: 1. Left ventricular ejection fraction, by estimation, is 60 to 65%. The  left ventricle has normal function. The left ventricle has no regional  wall motion abnormalities. There is mild left ventricular hypertrophy.  Left ventricular diastolic parameters  are indeterminate.   2. Right ventricule is poorly visualized but grossly normal size and  systolic function   3. Left atrial size was mildly dilated.   4. Right atrial size was  mildly dilated.   5. The mitral valve is normal in structure. No evidence of mitral valve  regurgitation. No evidence of mitral stenosis.   6. The aortic valve was not well visualized. Aortic valve regurgitation  is not visualized. No aortic stenosis is present.  CTPE: Date: 01/08/21 Results: A. Bilateral PE B. 3V CAC and Aortic Atherosclerosis C. Multifocal lung consolidation D. Bilateral pleural effusion   Patient Profile     70 y.o. male with a hx of hypertension, diabetes mellitus and hyperlipidemia was admitted 12/28/2020 after fall, w/ TBI/SAH/SDH. Cards saw 01/07/2021 for the evaluation of atrial fibrillation.  Pt hospitalized after falling down 8 stairs and suffering SAH and occipital and temporal bone fractures. Afib noted 01/04/21 and started on amiodarone.  Assessment & Plan    Afib RVR New Pulmonary embolism - on Bival - IV amio d/c'd 08/25 due to hypotension, but BP has improved - now in SR, MD advised on restarting amio as oral med when able - He is on prn IV metop, would not start oral or standing BB with bradycardia at times  [facility] - per Trauma MD and NS  Respiratory infection Hypoxic respiratory failure - currently on Omnipen, cefepime, per attending - renal function worsening - I/O +19.5 L since admit - ++volume overload but cannot diurese w/ BP and renal issues - getting albumin and IVF, last albumin level 1.7    Aortic Atherosclerosis and Coronary artery calcifications - eval as outpt, no meds right now  Hypokalemia: - will give one run IV due to concerns for absorption, lower dose due to decreased renal function  - otherwise, per Trauma team  For questions or updates, please contact [place] Please consult [email or web address] for contact info under       Signed, Rosaria Ferries, PA-C  01/11/2021, 10:47 AM      Personally seen and examined. Agree with APP above with the following comments: Briefly 71 yo M with SAH complicated by AF RVR (0/53->  8/25) PE, worsening hypoxic respiratory failure and PNA  Patient is intubated but more interactive with sedation Exam notable for regular rate and rhythm, poor lung volumes, distended abdomen, LE warmer than prior.  A line in place  Labs notable for worsening creatinine and hypoalbuminemia Personally reviewed relevant tests; Echo shows low normal RV function and normal LV function. Would recommend  - will transition from amiodarone (he has several days of amio in his symptoms since 01/04/21) - if returns to RVR, can switch back to IV - signs and symptoms concerning for sepsis due to PNA; off pressors at this point   [facility] will sign  off.   Medication Recommendations:  200 mg Amiodarone daily; switch back to IV no bolus if returns to RVR Other recommendations (labs, testing, etc):  outpatient TFTs, LFTs, and PFTs Follow up as an outpatient:  Worrisome for prolonged course. When patient transitioned out of ICU, we are happy to begin working on timely follow up.Marland Kitchen Happy to re-engage if new issues occur  CRITICAL CARE Performed by: Dimitris Shanahan A Pearly Ap[place]ito  Total critical care time: 35 minutes. Critical care time was exclusive of separately billable procedures and treating other patients. Critical care was necessary to treat or prevent imminent or life-threatening deterioration. Critical care was time spent personally by me on the following activities: development of treatment plan with patient and/or surrogate as well as nursing, discussions with consultants, evaluation of patient's response to treatment, examination of patient, obtaining history from patient or surrogate, ordering and performing treatments and interventions, ordering and review of laboratory studies, ordering and review of radiographic studies, pulse oximetry and re-evaluation of patient's condition.    Signed, Rudean Haskell, MD [place]  01/11/2021 12:46 PM    For questions or updates,  please contact [place] Please consult [email or web address] for contact info under Cardiology/STEMI.

## 2021-01-11 NOTE — Progress Notes (Addendum)
Patient ID: Angel [name], male   DOB: 12-15-1950, 70 y.o.   MRN: [id number] Follow up - Trauma Critical Care  Patient Details:    Angel [name] is an 70 y.o. male.  Lines/tubes : Airway 8 mm (Active)  Secured at (cm) 25 cm 01/11/21 0342  Measured From Lips 01/11/21 [place] 01/11/21 0342  Secured By [employer] 01/11/21 0342  Tube Holder Repositioned Yes 01/11/21 0342  Prone position No 01/11/21 0342  Cuff Pressure (cm H2O) Clear OR 27-39 CmH2O 01/10/21 2046  Site Condition Dry 01/11/21 0342     PICC Triple Lumen 40/10/27 PICC Right Basilic 47 cm 1 cm (Active)  Indication for Insertion or Continuance of Line Limited venous access - need for IV therapy >5 days (PICC only) 01/10/21 2000  Exposed Catheter (cm) 1 cm 01/09/21 0916  Site Assessment Clean;Dry;Intact 01/10/21 2000  Lumen #1 Status Infusing 01/10/21 2000  Lumen #2 Status Infusing 01/10/21 2000  Lumen #3 Status Saline locked 01/10/21 2000  Dressing Type Transparent 01/10/21 2000  Dressing Status Clean;Dry;Intact 01/10/21 2000  Antimicrobial disc in place? Yes 01/10/21 2000  Safety Lock Not Applicable 25/36/64 4034  Line Care Connections checked and tightened;Line pulled back 01/10/21 2000  Dressing Intervention New dressing;Other (Comment) 01/09/21 0916  Dressing Change Due 01/16/21 01/10/21 2000     NG/OG Vented/Dual Lumen Oral External length of tube 60 cm (Active)  Output (mL) 275 mL 01/11/21 0600     External Urinary Catheter (Active)  Collection Container Standard drainage bag 01/10/21 2000  Securement Method Securing device (Describe) 01/10/21 2000  Site Assessment Clean;Intact 01/10/21 2000  Intervention Male External Urinary Catheter Replaced 01/10/21 2000  Output (mL) 150 mL 01/11/21 0600     Fecal Management System (Active)  Does patient meet criteria for removal? No 01/10/21 2000  Daily care Skin around tube assessed 01/10/21 2000  Output (mL) 150 mL 01/11/21 0600  Intake  (mL) 30 mL 01/06/21 0520    Microbiology/Sepsis markers: Results for orders placed or performed during the hospital encounter of 12/28/20  Resp Panel by RT-PCR (Flu A&B, Covid) Nasopharyngeal Swab     Status: None   Collection Time: 12/28/20  4:17 PM   Specimen: Nasopharyngeal Swab; Nasopharyngeal(NP) swabs in vial transport medium  Result Value Ref Range Status   SARS Coronavirus 2 by RT PCR NEGATIVE NEGATIVE Final    Comment: (NOTE) SARS-CoV-2 target nucleic acids are NOT DETECTED.  The SARS-CoV-2 RNA is generally detectable in upper respiratory specimens during the acute phase of infection. The lowest concentration of SARS-CoV-2 viral copies this assay can detect is 138 copies/mL. A negative result does not preclude SARS-Cov-2 infection and should not be used as the sole basis for treatment or other patient management decisions. A negative result may occur with  improper specimen collection/handling, submission of specimen other than nasopharyngeal swab, presence of viral mutation(s) within the areas targeted by this assay, and inadequate number of viral copies(<138 copies/mL). A negative result must be combined with clinical observations, patient history, and epidemiological information. The expected result is Negative.  Fact Sheet for Patients:  EntrepreneurPulse.com.au  Fact Sheet for Healthcare Providers:  IncredibleEmployment.be  This test is no t yet approved or cleared by the [place] FDA and  has been authorized for detection and/or diagnosis of SARS-CoV-2 by FDA under an Emergency Use Authorization (EUA). This EUA will remain  in effect (meaning this test can be used) for the duration of the COVID-19 declaration under Section 564(b)(1)  of the Act, 21 U.S.C.section 360bbb-3(b)(1), unless the authorization is terminated  or revoked sooner.       Influenza A by PCR NEGATIVE NEGATIVE Final   Influenza B by PCR NEGATIVE  NEGATIVE Final    Comment: (NOTE) The Xpert Xpress SARS-CoV-2/FLU/RSV plus assay is intended as an aid in the diagnosis of influenza from Nasopharyngeal swab specimens and should not be used as a sole basis for treatment. Nasal washings and aspirates are unacceptable for Xpert Xpress SARS-CoV-2/FLU/RSV testing.  Fact Sheet for Patients: EntrepreneurPulse.com.au  Fact Sheet for Healthcare Providers: IncredibleEmployment.be  This test is not yet approved or cleared by the [place] FDA and has been authorized for detection and/or diagnosis of SARS-CoV-2 by FDA under an Emergency Use Authorization (EUA). This EUA will remain in effect (meaning this test can be used) for the duration of the COVID-19 declaration under Section 564(b)(1) of the Act, 21 U.S.C. section 360bbb-3(b)(1), unless the authorization is terminated or revoked.  Performed at [facility], [place] [address], [place], [place] [address]   MRSA Next Gen by PCR, Nasal     Status: None   Collection Time: 12/28/20  7:32 PM   Specimen: Nasal Mucosa; Nasal Swab  Result Value Ref Range Status   MRSA by PCR Next Gen NOT DETECTED NOT DETECTED Final    Comment: (NOTE) The GeneXpert MRSA Assay (FDA approved for NASAL specimens only), is one component of a comprehensive MRSA colonization surveillance program. It is not intended to diagnose MRSA infection nor to guide or monitor treatment for MRSA infections. Test performance is not FDA approved in patients less than 25 years old. Performed at [facility], [place] [address], [place], [place] [address]   Culture, Respiratory w Gram Stain     Status: None   Collection Time: 12/31/20 11:06 AM   Specimen: Tracheal Aspirate; Respiratory  Result Value Ref Range Status   Specimen Description TRACHEAL ASPIRATE  Final   Special Requests NONE  Final   Gram Stain   Final    FEW SQUAMOUS EPITHELIAL CELLS PRESENT FEW WBC PRESENT,BOTH PMN  AND MONONUCLEAR FEW GRAM POSITIVE COCCI Performed at [facility], [place] [address], [place], [place] [address]    Culture   Final    FEW PSEUDOMONAS AERUGINOSA FEW STREPTOCOCCUS PNEUMONIAE    Report Status 01/03/2021 FINAL  Final   Organism ID, Bacteria PSEUDOMONAS AERUGINOSA  Final   Organism ID, Bacteria STREPTOCOCCUS PNEUMONIAE  Final      Susceptibility   Pseudomonas aeruginosa - MIC*    CEFTAZIDIME 4 SENSITIVE Sensitive     CIPROFLOXACIN <=0.25 SENSITIVE Sensitive     GENTAMICIN <=1 SENSITIVE Sensitive     IMIPENEM 2 SENSITIVE Sensitive     PIP/TAZO 8 SENSITIVE Sensitive     CEFEPIME 2 SENSITIVE Sensitive     * FEW PSEUDOMONAS AERUGINOSA   Streptococcus pneumoniae - MIC*    ERYTHROMYCIN 4 RESISTANT Resistant     LEVOFLOXACIN 0.5 SENSITIVE Sensitive     VANCOMYCIN <=0.12 SENSITIVE Sensitive     PENO - penicillin <=0.06      PENICILLIN (non-meningitis) <=0.06 SENSITIVE Sensitive     PENICILLIN (oral) <=0.06 SENSITIVE Sensitive     CEFTRIAXONE (non-meningitis) <=0.12 SENSITIVE Sensitive     * FEW STREPTOCOCCUS PNEUMONIAE  Culture, blood (routine x 2)     Status: None   Collection Time: 01/05/21 11:44 AM   Specimen: BLOOD  Result Value Ref Range Status   Specimen Description BLOOD SITE NOT  SPECIFIED  Final   Special Requests AEROBIC BOTTLE ONLY Blood Culture adequate volume  Final   Culture   Final    NO GROWTH 5 DAYS Performed at [facility], [place] [address] [place], [place] [address]    Report Status 01/10/2021 FINAL  Final  Culture, blood (routine x 2)     Status: None   Collection Time: 01/05/21 11:44 AM   Specimen: BLOOD  Result Value Ref Range Status   Specimen Description BLOOD SITE NOT SPECIFIED  Final   Special Requests   Final    AEROBIC BOTTLE ONLY Blood Culture results may not be optimal due to an inadequate volume of blood received in culture bottles   Culture   Final    NO GROWTH 5 DAYS Performed at [facility], [place] [address],  [place], [place] [address]    Report Status 01/10/2021 FINAL  Final  Surgical PCR screen     Status: None   Collection Time: 01/08/21 12:14 AM   Specimen: Nasal Mucosa; Nasal Swab  Result Value Ref Range Status   MRSA, PCR NEGATIVE NEGATIVE Final   Staphylococcus aureus NEGATIVE NEGATIVE Final    Comment: (NOTE) The Xpert SA Assay (FDA approved for NASAL specimens in patients 25 years of age and older), is one component of a comprehensive surveillance program. It is not intended to diagnose infection nor to guide or monitor treatment. Performed at [facility], [place] [address], [place], [place] [address]   Culture, Respiratory w Gram Stain     Status: None (Preliminary result)   Collection Time: 01/08/21  1:24 PM   Specimen: Tracheal Aspirate; Respiratory  Result Value Ref Range Status   Specimen Description TRACHEAL ASPIRATE  Final   Special Requests NONE  Final   Gram Stain   Final    RARE SQUAMOUS EPITHELIAL CELLS PRESENT MODERATE WBC PRESENT, PREDOMINANTLY MONONUCLEAR FEW GRAM NEGATIVE RODS    Culture   Final    RARE PSEUDOMONAS AERUGINOSA RARE ENTEROCOCCUS FAECALIS SUSCEPTIBILITIES TO FOLLOW Performed at [facility], [place] [address] [place], [place] [address]    Report Status PENDING  Incomplete   Organism ID, Bacteria PSEUDOMONAS AERUGINOSA  Final      Susceptibility   Pseudomonas aeruginosa - MIC*    CEFTAZIDIME 4 SENSITIVE Sensitive     CIPROFLOXACIN <=0.25 SENSITIVE Sensitive     GENTAMICIN <=1 SENSITIVE Sensitive     IMIPENEM 2 SENSITIVE Sensitive     PIP/TAZO 8 SENSITIVE Sensitive     CEFEPIME 2 SENSITIVE Sensitive     * RARE PSEUDOMONAS AERUGINOSA  Gastrointestinal Panel by PCR , Stool     Status: None   Collection Time: 01/08/21  5:04 PM   Specimen: Stool  Result Value Ref Range Status   Campylobacter species NOT DETECTED NOT DETECTED Final   Plesimonas shigelloides NOT DETECTED NOT DETECTED Final   Salmonella species NOT DETECTED NOT DETECTED  Final   Yersinia enterocolitica NOT DETECTED NOT DETECTED Final   Vibrio species NOT DETECTED NOT DETECTED Final   Vibrio cholerae NOT DETECTED NOT DETECTED Final   Enteroaggregative E coli (EAEC) NOT DETECTED NOT DETECTED Final   Enteropathogenic E coli (EPEC) NOT DETECTED NOT DETECTED Final   Enterotoxigenic E coli (ETEC) NOT DETECTED NOT DETECTED Final   Shiga like toxin producing E coli (STEC) NOT DETECTED NOT DETECTED Final   Shigella/Enteroinvasive E coli (EIEC) NOT DETECTED NOT DETECTED Final   Cryptosporidium NOT DETECTED NOT DETECTED Final  Cyclospora cayetanensis NOT DETECTED NOT DETECTED Final   Entamoeba histolytica NOT DETECTED NOT DETECTED Final   Giardia lamblia NOT DETECTED NOT DETECTED Final   Adenovirus F40/41 NOT DETECTED NOT DETECTED Final   Astrovirus NOT DETECTED NOT DETECTED Final   Norovirus GI/GII NOT DETECTED NOT DETECTED Final   Rotavirus A NOT DETECTED NOT DETECTED Final   Sapovirus (I, II, IV, and V) NOT DETECTED NOT DETECTED Final    Comment: Performed at [facility], [place], [place] [address]  C Difficile Quick Screen (NO PCR Reflex)     Status: None   Collection Time: 01/09/21 11:07 AM   Specimen: STOOL  Result Value Ref Range Status   C Diff antigen NEGATIVE NEGATIVE Final   C Diff toxin NEGATIVE NEGATIVE Final   C Diff interpretation No C. difficile detected.  Final    Comment: Performed at [facility], [place] [address], [place], [place] [address]    Anti-infectives:  Anti-infectives (From admission, onward)    Start     Dose/Rate Route Frequency Ordered Stop   01/11/21 2330  ceFEPIme (MAXIPIME) 2 g in sodium chloride 0.9 % 100 mL IVPB        2 g 200 mL/hr over 30 Minutes Intravenous Every 24 hours 01/11/21 0711     01/10/21 1645  ampicillin (OMNIPEN) 2 g in sodium chloride 0.9 % 100 mL IVPB        2 g 300 mL/hr over 20 Minutes Intravenous Every 8 hours 01/10/21 1549     01/09/21 2200  ceFEPIme (MAXIPIME) 2 g  in sodium chloride 0.9 % 100 mL IVPB  Status:  Discontinued        2 g 200 mL/hr over 30 Minutes Intravenous Every 12 hours 01/09/21 1458 01/11/21 0711   01/08/21 1515  metroNIDAZOLE (FLAGYL) IVPB 500 mg  Status:  Discontinued        500 mg 100 mL/hr over 60 Minutes Intravenous Every 8 hours 01/08/21 1428 01/10/21 1618   01/03/21 0600  vancomycin (VANCOREADY) IVPB 1250 mg/250 mL  Status:  Discontinued        1,250 mg 166.7 mL/hr over 90 Minutes Intravenous Every 12 hours 01/02/21 1717 01/03/21 0837   01/02/21 1800  vancomycin (VANCOREADY) IVPB 2000 mg/400 mL        2,000 mg 200 mL/hr over 120 Minutes Intravenous  Once 01/02/21 1712 01/02/21 2007   01/02/21 0900  ceFEPIme (MAXIPIME) 2 g in sodium chloride 0.9 % 100 mL IVPB  Status:  Discontinued        2 g 200 mL/hr over 30 Minutes Intravenous Every 8 hours 01/02/21 0849 01/09/21 1458     Consults: Treatment Team:  Izora Gala, MD    Studies:    Events:  Subjective:    Overnight Issues:   Objective:  Vital signs for last 24 hours: Temp:  [98 F (36.7 C)-98.8 F (37.1 C)] 98.2 F (36.8 C) (08/26 0400) Pulse Rate:  [41-139] 45 (08/26 0700) Resp:  [14-27] 20 (08/26 0700) BP: (55-185)/(30-126) 131/71 (08/26 0700) SpO2:  [85 %-100 %] 96 % (08/26 0700) Arterial Line BP: (105-173)/(48-81) 119/60 (08/26 0700) FiO2 (%):  [40 %] 40 % (08/26 0342)  Hemodynamic parameters for last 24 hours:    Intake/Output from previous day: 08/25 0701 - 08/26 0700 In: 4844.3 [I.V.:2335.4; NG/GT:1308.9; IV Piggyback:1200] Out: 2525 [Urine:750; Emesis/NG output:925; Stool:850]  Intake/Output this shift: No intake/output data recorded.  Vent settings for last 24 hours: Vent Mode: PRVC FiO2 (%):  [  40 %] 40 % Set Rate:  [20 bmp] 20 bmp Vt Set:  [650 mL] 650 mL PEEP:  [5 cmH20] 5 cmH20 Plateau Pressure:  [13 cmH20-33 cmH20] 20 cmH20  Physical Exam:  General: alert and on vent Neuro: F/C well HEENT/Neck: ETT Resp: clear to  auscultation bilaterally CVS: RRR GI: distended with tympani but NT Extremities: edema 1+  Results for orders placed or performed during the hospital encounter of 12/28/20 (from the past 24 hour(s))  Glucose, capillary     Status: None   Collection Time: 01/10/21  7:57 AM  Result Value Ref Range   Glucose-Capillary 95 70 - 99 mg/dL  Glucose, capillary     Status: Abnormal   Collection Time: 01/10/21 11:29 AM  Result Value Ref Range   Glucose-Capillary 163 (H) 70 - 99 mg/dL  APTT     Status: Abnormal   Collection Time: 01/10/21 11:46 AM  Result Value Ref Range   aPTT 67 (H) 24 - 36 seconds  Cooxemetry Panel (carboxy, met, total hgb, O2 sat)     Status: Abnormal   Collection Time: 01/10/21  1:45 PM  Result Value Ref Range   Total hemoglobin 8.4 (L) 12.0 - 16.0 g/dL   O2 Saturation 74.9 %   Carboxyhemoglobin 1.1 0.5 - 1.5 %   Methemoglobin 0.7 0.0 - 1.5 %  Comprehensive metabolic panel     Status: Abnormal   Collection Time: 01/10/21  1:46 PM  Result Value Ref Range   Sodium 141 135 - 145 mmol/L   Potassium 3.5 3.5 - 5.1 mmol/L   Chloride 111 98 - 111 mmol/L   CO2 20 (L) 22 - 32 mmol/L   Glucose, Bld 317 (H) 70 - 99 mg/dL   BUN 94 (H) 8 - 23 mg/dL   Creatinine, Ser 2.77 (H) 0.61 - 1.24 mg/dL   Calcium 7.9 (L) 8.9 - 10.3 mg/dL   Total Protein 5.7 (L) 6.5 - 8.1 g/dL   Albumin 1.7 (L) 3.5 - 5.0 g/dL   AST 30 15 - 41 U/L   ALT 34 0 - 44 U/L   Alkaline Phosphatase 53 38 - 126 U/L   Total Bilirubin 0.4 0.3 - 1.2 mg/dL   GFR, Estimated 24 (L) >60 mL/min   Anion gap 10 5 - 15  Troponin I (High Sensitivity)     Status: Abnormal   Collection Time: 01/10/21  1:46 PM  Result Value Ref Range   Troponin I (High Sensitivity) 52 (H) <18 ng/L  Lactic acid, plasma     Status: None   Collection Time: 01/10/21  2:00 PM  Result Value Ref Range   Lactic Acid, Venous 1.9 0.5 - 1.9 mmol/L  Troponin I (High Sensitivity)     Status: Abnormal   Collection Time: 01/10/21  3:18 PM  Result Value  Ref Range   Troponin I (High Sensitivity) 56 (H) <18 ng/L  Glucose, capillary     Status: Abnormal   Collection Time: 01/10/21  3:59 PM  Result Value Ref Range   Glucose-Capillary 205 (H) 70 - 99 mg/dL  APTT     Status: Abnormal   Collection Time: 01/10/21  6:11 PM  Result Value Ref Range   aPTT 67 (H) 24 - 36 seconds  Glucose, capillary     Status: Abnormal   Collection Time: 01/10/21  7:55 PM  Result Value Ref Range   Glucose-Capillary 211 (H) 70 - 99 mg/dL  Glucose, capillary     Status: Abnormal   Collection Time:  01/10/21 11:51 PM  Result Value Ref Range   Glucose-Capillary 161 (H) 70 - 99 mg/dL  Glucose, capillary     Status: Abnormal   Collection Time: 01/11/21  3:54 AM  Result Value Ref Range   Glucose-Capillary 120 (H) 70 - 99 mg/dL  Basic metabolic panel     Status: Abnormal   Collection Time: 01/11/21  6:03 AM  Result Value Ref Range   Sodium 146 (H) 135 - 145 mmol/L   Potassium 3.2 (L) 3.5 - 5.1 mmol/L   Chloride 115 (H) 98 - 111 mmol/L   CO2 19 (L) 22 - 32 mmol/L   Glucose, Bld 119 (H) 70 - 99 mg/dL   BUN 112 (H) 8 - 23 mg/dL   Creatinine, Ser 3.43 (H) 0.61 - 1.24 mg/dL   Calcium 8.2 (L) 8.9 - 10.3 mg/dL   GFR, Estimated 18 (L) >60 mL/min   Anion gap 12 5 - 15  CBC     Status: Abnormal   Collection Time: 01/11/21  6:03 AM  Result Value Ref Range   WBC 14.6 (H) 4.0 - 10.5 K/uL   RBC 3.17 (L) 4.22 - 5.81 MIL/uL   Hemoglobin 8.0 (L) 13.0 - 17.0 g/dL   HCT 26.1 (L) 39.0 - 52.0 %   MCV 82.3 80.0 - 100.0 fL   MCH 25.2 (L) 26.0 - 34.0 pg   MCHC 30.7 30.0 - 36.0 g/dL   RDW 17.3 (H) 11.5 - 15.5 %   Platelets 396 150 - 400 K/uL   nRBC 0.1 0.0 - 0.2 %  Magnesium     Status: None   Collection Time: 01/11/21  6:03 AM  Result Value Ref Range   Magnesium 2.2 1.7 - 2.4 mg/dL  Phosphorus     Status: Abnormal   Collection Time: 01/11/21  6:03 AM  Result Value Ref Range   Phosphorus 5.4 (H) 2.5 - 4.6 mg/dL  APTT     Status: Abnormal   Collection Time: 01/11/21  6:03  AM  Result Value Ref Range   aPTT 74 (H) 24 - 36 seconds    Assessment & Plan: Present on Admission: **None**    [name]: 14 days   Additional comments:I reviewed the patient's new clinical lab test results. . Fall down stairs 8/12   VDRF - guaifenisen, wean, trial of extubation ID - resp CX now with pseud and enterococcus - maxipime/ampicillin. CT A/P with ascending colitis and distention. Stool studies and C. dif are all negative TBI/SAH/SDH - NSGY c/s, Dr. Annette Stable, starting to F/C. Significant frontal lobe injuries. Keppra x7d for sz ppx. Follows commands briskly this AM. Occipital bone fx - NSGY c/s, Dr. Annette Stable Temporal bone fx extending into middle ear - ENT c/s, Dr. Constance Holster Right TM Rupture - ENT c/s, Dr. Constance Holster AFRVR - amio to PO, appreciate Cardiology F/U Bilateral pulmonary embolism - bival AKI - up to 3.43. Increase IVF, albumin bolus Hx DM2 - resistant SSI, glargine 43u BID (171u/24h) Hx HTN - PRN meds FEN - NPO, cortrak/TF. Vomited so hold TF, OGT VTE - SCDs, bival gtt Foley - removed Dispo - ICU, MS much better, trial of extubation Critical Care Total Time*: 38 Minutes  Georganna Skeans, MD, MPH, FACS Trauma & General Surgery Use [email or web address] to contact on call provider  01/11/2021  *Care during the described time interval was provided by me. I have reviewed this patient's available data, including medical history, events of note, physical examination and test results as part of my evaluation.

## 2021-01-11 NOTE — Progress Notes (Signed)
ANTICOAGULATION CONSULT NOTE  Pharmacy Consult for bivalirudin Indication: atrial fibrillation, DVT, PE  No Known Allergies  Patient Measurements: Height: 6\' 2"  (188 cm) Weight: 131 kg (288 lb 12.8 oz) IBW/kg (Calculated) : 82.2 Heparin Dosing Weight: 107kg  Vital Signs: Temp: 98.2 F (36.8 C) (08/26 1200) Temp Source: Axillary (08/26 1200) BP: 183/73 (08/26 1400) Pulse Rate: 73 (08/26 1400)  Labs: Recent Labs    01/10/21 0238 01/10/21 0558 01/10/21 1146 01/10/21 1346 01/10/21 1518 01/10/21 1811 01/11/21 0603 01/11/21 1340  HGB  --  7.9*  --   --   --   --  8.0*  --   HCT  --  26.6*  --   --   --   --  26.1*  --   PLT  --  290  --   --   --   --  396  --   APTT  --   --    < >  --   --  67* 74* 46*  CREATININE 2.08*  --   --  2.77*  --   --  3.43*  --   TROPONINIHS  --   --   --  52* 56*  --   --   --    < > = values in this interval not displayed.     Estimated Creatinine Clearance: 28.8 mL/min (A) (by C-G formula based on SCr of 3.43 mg/dL (H)).   Medical History: Past Medical History:  Diagnosis Date   DM (diabetes mellitus) ([place])    HLD (hyperlipidemia)    Hypertension     Assessment: 80 YOM presenting s/p fall with TBI/SAH and facial fx, in afib started on amiodarone and now cleared per trauma for full dose anticoagulation. Trach/PEG cancelled for 8/23 with patient found to have acute bilateral PE, upper extremity DVT and increased oxygen requirements. Pharmacy consulted to dose bivalirudin per Trauma.  Given patient's clinical status and recent head injury, will watch dosing and signs and symptoms of bleeding closely.   Will aim for middle of therapeutic range aptt of ~65s trying to avoid higher range 70-80s due to recent head injury.  After rate decrease to 0.03mg /kg/hr aPTT now slightly subtherapeutic  Goal of Therapy:  Aptt goal ~50-65s per discussion with Trauma ([place]) Monitor platelets by anticoagulation protocol: Yes   Plan:  Adjust  bivalirudin to 0.035 mg/kg/hr Check aPTT in 4h to confirm Daily aPTT, CBC, s/s bleeding  Bertis Ruddy, PharmD Clinical Pharmacist ED Pharmacist Phone # [phone] 01/11/2021 2:42 PM

## 2021-01-11 NOTE — Progress Notes (Signed)
ANTICOAGULATION CONSULT NOTE  Pharmacy Consult for bivalirudin Indication: atrial fibrillation, DVT, PE  No Known Allergies  Patient Measurements: Height: 6\' 2"  (188 cm) Weight: 131 kg (288 lb 12.8 oz) IBW/kg (Calculated) : 82.2 Heparin Dosing Weight: 107kg  Vital Signs: Temp: 98.3 F (36.8 C) (08/26 1600) Temp Source: Axillary (08/26 1600) BP: 163/51 (08/26 1800) Pulse Rate: 70 (08/26 1900)  Labs: Recent Labs    01/10/21 0238 01/10/21 0558 01/10/21 1146 01/10/21 1346 01/10/21 1518 01/10/21 1811 01/11/21 0603 01/11/21 1340 01/11/21 1821  HGB  --  7.9*  --   --   --   --  8.0*  --   --   HCT  --  26.6*  --   --   --   --  26.1*  --   --   PLT  --  290  --   --   --   --  396  --   --   APTT  --   --    < >  --   --    < > 74* 46* 52*  CREATININE 2.08*  --   --  2.77*  --   --  3.43*  --   --   TROPONINIHS  --   --   --  52* 56*  --   --   --   --    < > = values in this interval not displayed.     Estimated Creatinine Clearance: 28.8 mL/min (A) (by C-G formula based on SCr of 3.43 mg/dL (H)).   Assessment: 67 YOM presenting s/p fall with TBI/SAH and facial fx, in afib started on amiodarone and now cleared per trauma for full dose anticoagulation. Trach/PEG cancelled for 8/23 with patient found to have acute bilateral PE, upper extremity DVT and increased oxygen requirements. Pharmacy consulted to dose bivalirudin per Trauma.  Given patient's clinical status and recent head injury, will watch dosing and signs and symptoms of bleeding closely.   Will aim for middle of therapeutic range aptt of ~65s trying to avoid higher range 70-80s due to recent head injury.  aPTT therapeutic and toward the low end of range.  No bleeding reported.  Goal of Therapy:  Aptt goal ~50-65s per discussion with Trauma ([place]) Monitor platelets by anticoagulation protocol: Yes   Plan:  Increase bivalirudin slightly to 0.0385 mg/kg/hr (10% increase) Daily aPTT, CBC, s/s  bleeding  Ruxin Ransome D. Mina Marble, PharmD, BCPS, [place] 01/11/2021, 7:17 PM

## 2021-01-11 NOTE — Progress Notes (Signed)
Patient ID: Angel [name], male   DOB: 08/31/1950, 70 y.o.   MRN: [id number] Increasing WOB and now on FM O2. Unable to clear secretions. Will have anesthesia reintubate him. I spoke with his wife and son. Angel Skeans, MD, MPH, FACS Please use [email or web address] to contact on call provider

## 2021-01-12 LAB — BASIC METABOLIC PANEL
Anion gap: 11 (ref 5–15)
BUN: 124 mg/dL — ABNORMAL HIGH (ref 8–23)
CO2: 18 mmol/L — ABNORMAL LOW (ref 22–32)
Calcium: 8 mg/dL — ABNORMAL LOW (ref 8.9–10.3)
Chloride: 118 mmol/L — ABNORMAL HIGH (ref 98–111)
Creatinine, Ser: 4.49 mg/dL — ABNORMAL HIGH (ref 0.61–1.24)
GFR, Estimated: 13 mL/min — ABNORMAL LOW (ref >60)
Glucose, Bld: 71 mg/dL (ref 70–99)
Potassium: 3.6 mmol/L (ref 3.5–5.1)
Sodium: 147 mmol/L — ABNORMAL HIGH (ref 135–145)

## 2021-01-12 LAB — CBC
HCT: 23.9 % — ABNORMAL LOW (ref 39.0–52.0)
Hemoglobin: 7.3 g/dL — ABNORMAL LOW (ref 13.0–17.0)
MCH: 25.6 pg — ABNORMAL LOW (ref 26.0–34.0)
MCHC: 30.5 g/dL (ref 30.0–36.0)
MCV: 83.9 fL (ref 80.0–100.0)
Platelets: 409 10*3/uL — ABNORMAL HIGH (ref 150–400)
RBC: 2.85 MIL/uL — ABNORMAL LOW (ref 4.22–5.81)
RDW: 17.7 % — ABNORMAL HIGH (ref 11.5–15.5)
WBC: 9 10*3/uL (ref 4.0–10.5)
nRBC: 0 % (ref 0.0–0.2)

## 2021-01-12 LAB — GLUCOSE, CAPILLARY
Glucose-Capillary: 108 mg/dL — ABNORMAL HIGH (ref 70–99)
Glucose-Capillary: 108 mg/dL — ABNORMAL HIGH (ref 70–99)
Glucose-Capillary: 62 mg/dL — ABNORMAL LOW (ref 70–99)
Glucose-Capillary: 68 mg/dL — ABNORMAL LOW (ref 70–99)
Glucose-Capillary: 71 mg/dL (ref 70–99)
Glucose-Capillary: 72 mg/dL (ref 70–99)
Glucose-Capillary: 73 mg/dL (ref 70–99)
Glucose-Capillary: 76 mg/dL (ref 70–99)

## 2021-01-12 LAB — APTT: aPTT: 58 s — ABNORMAL HIGH (ref 24–36)

## 2021-01-12 LAB — MAGNESIUM: Magnesium: 2.3 mg/dL (ref 1.7–2.4)

## 2021-01-12 LAB — PHOSPHORUS: Phosphorus: 6.6 mg/dL — ABNORMAL HIGH (ref 2.5–4.6)

## 2021-01-12 MED ORDER — DEXTROSE 50 % IV SOLN
INTRAVENOUS | Status: AC
  Filled 2021-01-12: qty 50

## 2021-01-12 MED ORDER — PROPOFOL 1000 MG/100ML IV EMUL
0.0000 ug/kg/min | INTRAVENOUS | Status: DC
  Administered 2021-01-12: 10 ug/kg/min via INTRAVENOUS
  Administered 2021-01-12: 5 ug/kg/min via INTRAVENOUS
  Administered 2021-01-13: 25 ug/kg/min via INTRAVENOUS
  Administered 2021-01-13 (×2): 20 ug/kg/min via INTRAVENOUS
  Administered 2021-01-14: 15 ug/kg/min via INTRAVENOUS
  Administered 2021-01-14: 20 ug/kg/min via INTRAVENOUS
  Filled 2021-01-12 (×8): qty 100

## 2021-01-12 NOTE — Progress Notes (Addendum)
[place] for bivalirudin Indication: atrial fibrillation, DVT, PE  No Known Allergies  Patient Measurements: Height: 6\' 2"  (188 cm) Weight: 131 kg (288 lb 12.8 oz) IBW/kg (Calculated) : 82.2 Heparin Dosing Weight: 107kg  Vital Signs: Temp: 98.7 F (37.1 C) (08/27 0400) Temp Source: Oral (08/27 0400) BP: 118/62 (08/27 1000) Pulse Rate: 57 (08/27 1055)  Labs: Recent Labs    01/10/21 0558 01/10/21 1146 01/10/21 1346 01/10/21 1518 01/10/21 1811 01/11/21 0603 01/11/21 1340 01/11/21 1821 01/12/21 0552  HGB 7.9*  --   --   --   --  8.0*  --   --  7.3*  HCT 26.6*  --   --   --   --  26.1*  --   --  23.9*  PLT 290  --   --   --   --  396  --   --  409*  APTT  --    < >  --   --    < > 74* 46* 52* 58*  CREATININE  --   --  2.77*  --   --  3.43*  --   --  4.49*  TROPONINIHS  --   --  52* 56*  --   --   --   --   --    < > = values in this interval not displayed.     Estimated Creatinine Clearance: 22 mL/min (A) (by C-G formula based on SCr of 4.49 mg/dL (H)).   Assessment: 26 YOM presenting s/p fall with TBI/SAH and facial fx, in afib started on amiodarone and now cleared per trauma for full dose anticoagulation. 8/23 patient found to have small acute bilateral PE and age-indeterminate LUE DVT. Pharmacy consulted to dose bivalirudin per Trauma.  Given recent head bleed, will aim for middle of therapeutic range aptt and watch for signs and symptoms of bleeding closely.    aPTT 58 is therapeutic.  No issues with infusion or bleeding per RN.   Goal of Therapy:  Aptt goal ~50-65s per discussion with Trauma ([place]) Monitor platelets by anticoagulation protocol: Yes   Plan:  Continue bivalirudin 0.0385 mg/kg/hr  F/u daily aPTT, CBC, s/s bleeding Watch worsening renal function, which will decrease bival clearance    Benetta Spar, PharmD, BCPS, BCCP Clinical Pharmacist  Please check AMION for all [place] phone numbers After  10:00 PM, call [place] [phone]

## 2021-01-12 NOTE — Progress Notes (Signed)
Patient ID: Angel [name], male   DOB: 06-Aug-1950, 70 y.o.   MRN: [id number] Follow up - Trauma Critical Care  Patient Details:    Angel [name] is an 70 y.o. male.  Lines/tubes : Airway 7.5 mm (Active)  Secured at (cm) 24 cm 01/12/21 0740  Measured From Lips 01/12/21 0740  Secured Location Left 01/12/21 0740  Secured By [employer] 01/12/21 0740  Tube Holder Repositioned Yes 01/12/21 0740  Prone position No 01/12/21 0344  Cuff Pressure (cm H2O) Clear OR 27-39 CmH2O 01/12/21 0740  Site Condition Dry 01/12/21 0740     PICC Triple Lumen 94/85/46 PICC Right Basilic 47 cm 1 cm (Active)  Indication for Insertion or Continuance of Line Vasoactive infusions 01/12/21 0800  Exposed Catheter (cm) 1 cm 01/09/21 0916  Site Assessment Clean;Dry;Intact 01/12/21 0800  Lumen #1 Status Infusing 01/12/21 0800  Lumen #2 Status Infusing 01/12/21 0800  Lumen #3 Status Flushed;Saline locked;Blood return noted 01/12/21 0800  Dressing Type Transparent;Securing device 01/12/21 0800  Dressing Status Clean;Dry;Intact 01/12/21 0800  Antimicrobial disc in place? Yes 01/12/21 0800  Safety Lock Not Applicable 27/03/50 0938  Line Care Lumen 3 cap changed 01/11/21 1538  Dressing Intervention New dressing;Other (Comment) 01/09/21 0916  Dressing Change Due 01/16/21 01/11/21 1938     NG/OG Vented/Dual Lumen Oral External length of tube 49 cm (Active)  Tube Position (Required) External length of tube 01/11/21 2000  Measurement (cm) (Required) 49 cm 01/11/21 2000  Ongoing Placement Verification (Required) (See row information) Yes 01/11/21 2000  Site Assessment Clean;Dry;Intact 01/11/21 2000  Status Clamped 01/11/21 2000     External Urinary Catheter (Active)  Collection Container Standard drainage bag 01/11/21 2000  Securement Method Securing device (Describe) 01/11/21 2000  Site Assessment Clean;Intact 01/11/21 2000  Intervention Male External Urinary Catheter Replaced 01/11/21 2000  Output (mL) 50 mL  01/12/21 0600     Fecal Management System (Active)  Does patient meet criteria for removal? No 01/11/21 2000  Daily care Skin around tube assessed 01/11/21 2000  Output (mL) 375 mL 01/12/21 0600  Intake (mL) 30 mL 01/06/21 0520    Microbiology/Sepsis markers: Results for orders placed or performed during the hospital encounter of 12/28/20  Resp Panel by RT-PCR (Flu A&B, Covid) Nasopharyngeal Swab     Status: None   Collection Time: 12/28/20  4:17 PM   Specimen: Nasopharyngeal Swab; Nasopharyngeal(NP) swabs in vial transport medium  Result Value Ref Range Status   SARS Coronavirus 2 by RT PCR NEGATIVE NEGATIVE Final    Comment: (NOTE) SARS-CoV-2 target nucleic acids are NOT DETECTED.  The SARS-CoV-2 RNA is generally detectable in upper respiratory specimens during the acute phase of infection. The lowest concentration of SARS-CoV-2 viral copies this assay can detect is 138 copies/mL. A negative result does not preclude SARS-Cov-2 infection and should not be used as the sole basis for treatment or other patient management decisions. A negative result may occur with  improper specimen collection/handling, submission of specimen other than nasopharyngeal swab, presence of viral mutation(s) within the areas targeted by this assay, and inadequate number of viral copies(<138 copies/mL). A negative result must be combined with clinical observations, patient history, and epidemiological information. The expected result is Negative.  Fact Sheet for Patients:  EntrepreneurPulse.com.au  Fact Sheet for Healthcare Providers:  IncredibleEmployment.be  This test is no t yet approved or cleared by the [place] FDA and  has been authorized for detection and/or diagnosis of SARS-CoV-2 by FDA under an Emergency Use Authorization (  EUA). This EUA will remain  in effect (meaning this test can be used) for the duration of the COVID-19 declaration under  Section 564(b)(1) of the Act, 21 U.S.C.section 360bbb-3(b)(1), unless the authorization is terminated  or revoked sooner.       Influenza A by PCR NEGATIVE NEGATIVE Final   Influenza B by PCR NEGATIVE NEGATIVE Final    Comment: (NOTE) The Xpert Xpress SARS-CoV-2/FLU/RSV plus assay is intended as an aid in the diagnosis of influenza from Nasopharyngeal swab specimens and should not be used as a sole basis for treatment. Nasal washings and aspirates are unacceptable for Xpert Xpress SARS-CoV-2/FLU/RSV testing.  Fact Sheet for Patients: EntrepreneurPulse.com.au  Fact Sheet for Healthcare Providers: IncredibleEmployment.be  This test is not yet approved or cleared by the [place] FDA and has been authorized for detection and/or diagnosis of SARS-CoV-2 by FDA under an Emergency Use Authorization (EUA). This EUA will remain in effect (meaning this test can be used) for the duration of the COVID-19 declaration under Section 564(b)(1) of the Act, 21 U.S.C. section 360bbb-3(b)(1), unless the authorization is terminated or revoked.  Performed at [facility], [place] [address] [place] [address] [place], [place] [address]   MRSA Next Gen by PCR, Nasal     Status: None   Collection Time: 12/28/20  7:32 PM   Specimen: Nasal Mucosa; Nasal Swab  Result Value Ref Range Status   MRSA by PCR Next Gen NOT DETECTED NOT DETECTED Final    Comment: (NOTE) The GeneXpert MRSA Assay (FDA approved for NASAL specimens only), is one component of a comprehensive MRSA colonization surveillance program. It is not intended to diagnose MRSA infection nor to guide or monitor treatment for MRSA infections. Test performance is not FDA approved in patients less than 57 years old. Performed at [facility], [place] [address] [place], [place] [address]   Culture, Respiratory w Gram Stain     Status: None   Collection Time: 12/31/20 11:06 AM   Specimen: Tracheal Aspirate;  Respiratory  Result Value Ref Range Status   Specimen Description TRACHEAL ASPIRATE  Final   Special Requests NONE  Final   Gram Stain   Final    FEW SQUAMOUS EPITHELIAL CELLS PRESENT FEW WBC PRESENT,BOTH PMN AND MONONUCLEAR FEW GRAM POSITIVE COCCI Performed at [facility], [place] [address] [place],  [address]    Culture   Final    FEW PSEUDOMONAS AERUGINOSA FEW STREPTOCOCCUS PNEUMONIAE    Report Status 01/03/2021 FINAL  Final   Organism ID, Bacteria PSEUDOMONAS AERUGINOSA  Final   Organism ID, Bacteria STREPTOCOCCUS PNEUMONIAE  Final      Susceptibility   Pseudomonas aeruginosa - MIC*    CEFTAZIDIME 4 SENSITIVE Sensitive     CIPROFLOXACIN <=0.25 SENSITIVE Sensitive     GENTAMICIN <=1 SENSITIVE Sensitive     IMIPENEM 2 SENSITIVE Sensitive     PIP/TAZO 8 SENSITIVE Sensitive     CEFEPIME 2 SENSITIVE Sensitive     * FEW PSEUDOMONAS AERUGINOSA   Streptococcus pneumoniae - MIC*    ERYTHROMYCIN 4 RESISTANT Resistant     LEVOFLOXACIN 0.5 SENSITIVE Sensitive     VANCOMYCIN <=0.12 SENSITIVE Sensitive     PENO - penicillin <=0.06      PENICILLIN (non-meningitis) <=0.06 SENSITIVE Sensitive     PENICILLIN (oral) <=0.06 SENSITIVE Sensitive     CEFTRIAXONE (non-meningitis) <=0.12 SENSITIVE Sensitive     * FEW STREPTOCOCCUS PNEUMONIAE  Culture, blood (routine x 2)     Status: None  Collection Time: 01/05/21 11:44 AM   Specimen: BLOOD  Result Value Ref Range Status   Specimen Description BLOOD SITE NOT SPECIFIED  Final   Special Requests AEROBIC BOTTLE ONLY Blood Culture adequate volume  Final   Culture   Final    NO GROWTH 5 DAYS Performed at [facility], [place] [address] [place], [place] [address]    Report Status 01/10/2021 FINAL  Final  Culture, blood (routine x 2)     Status: None   Collection Time: 01/05/21 11:44 AM   Specimen: BLOOD  Result Value Ref Range Status   Specimen Description BLOOD SITE NOT SPECIFIED  Final   Special Requests   Final    AEROBIC  BOTTLE ONLY Blood Culture results may not be optimal due to an inadequate volume of blood received in culture bottles   Culture   Final    NO GROWTH 5 DAYS Performed at [facility], [place] [address], [place], [place] [address]    Report Status 01/10/2021 FINAL  Final  Surgical PCR screen     Status: None   Collection Time: 01/08/21 12:14 AM   Specimen: Nasal Mucosa; Nasal Swab  Result Value Ref Range Status   MRSA, PCR NEGATIVE NEGATIVE Final   Staphylococcus aureus NEGATIVE NEGATIVE Final    Comment: (NOTE) The Xpert SA Assay (FDA approved for NASAL specimens in patients 74 years of age and older), is one component of a comprehensive surveillance program. It is not intended to diagnose infection nor to guide or monitor treatment. Performed at [facility], [place] [address], [place], [place] [address]   Culture, Respiratory w Gram Stain     Status: None   Collection Time: 01/08/21  1:24 PM   Specimen: Tracheal Aspirate; Respiratory  Result Value Ref Range Status   Specimen Description TRACHEAL ASPIRATE  Final   Special Requests NONE  Final   Gram Stain   Final    RARE SQUAMOUS EPITHELIAL CELLS PRESENT MODERATE WBC PRESENT, PREDOMINANTLY MONONUCLEAR FEW GRAM NEGATIVE RODS Performed at [facility], [place] [address], [place], [place] [address]    Culture   Final    RARE PSEUDOMONAS AERUGINOSA RARE ENTEROCOCCUS FAECALIS    Report Status 01/11/2021 FINAL  Final   Organism ID, Bacteria PSEUDOMONAS AERUGINOSA  Final   Organism ID, Bacteria ENTEROCOCCUS FAECALIS  Final      Susceptibility   Enterococcus faecalis - MIC*    AMPICILLIN <=2 SENSITIVE Sensitive     VANCOMYCIN 1 SENSITIVE Sensitive     GENTAMICIN SYNERGY SENSITIVE Sensitive     * RARE ENTEROCOCCUS FAECALIS   Pseudomonas aeruginosa - MIC*    CEFTAZIDIME 4 SENSITIVE Sensitive     CIPROFLOXACIN <=0.25 SENSITIVE Sensitive     GENTAMICIN <=1 SENSITIVE Sensitive     IMIPENEM 2 SENSITIVE Sensitive      PIP/TAZO 8 SENSITIVE Sensitive     CEFEPIME 2 SENSITIVE Sensitive     * RARE PSEUDOMONAS AERUGINOSA  Gastrointestinal Panel by PCR , Stool     Status: None   Collection Time: 01/08/21  5:04 PM   Specimen: Stool  Result Value Ref Range Status   Campylobacter species NOT DETECTED NOT DETECTED Final   Plesimonas shigelloides NOT DETECTED NOT DETECTED Final   Salmonella species NOT DETECTED NOT DETECTED Final   Yersinia enterocolitica NOT DETECTED NOT DETECTED Final   Vibrio species NOT DETECTED NOT DETECTED Final   Vibrio cholerae NOT DETECTED NOT DETECTED Final   Enteroaggregative  E coli (EAEC) NOT DETECTED NOT DETECTED Final   Enteropathogenic E coli (EPEC) NOT DETECTED NOT DETECTED Final   Enterotoxigenic E coli (ETEC) NOT DETECTED NOT DETECTED Final   Shiga like toxin producing E coli (STEC) NOT DETECTED NOT DETECTED Final   Shigella/Enteroinvasive E coli (EIEC) NOT DETECTED NOT DETECTED Final   Cryptosporidium NOT DETECTED NOT DETECTED Final   Cyclospora cayetanensis NOT DETECTED NOT DETECTED Final   Entamoeba histolytica NOT DETECTED NOT DETECTED Final   Giardia lamblia NOT DETECTED NOT DETECTED Final   Adenovirus F40/41 NOT DETECTED NOT DETECTED Final   Astrovirus NOT DETECTED NOT DETECTED Final   Norovirus GI/GII NOT DETECTED NOT DETECTED Final   Rotavirus A NOT DETECTED NOT DETECTED Final   Sapovirus (I, II, IV, and V) NOT DETECTED NOT DETECTED Final    Comment: Performed at [facility], [place] [address] [place], [place] [address]  C Difficile Quick Screen (NO PCR Reflex)     Status: None   Collection Time: 01/09/21 11:07 AM   Specimen: STOOL  Result Value Ref Range Status   C Diff antigen NEGATIVE NEGATIVE Final   C Diff toxin NEGATIVE NEGATIVE Final   C Diff interpretation No C. difficile detected.  Final    Comment: Performed at [facility], [place] [address] [place], [place] [address]    Anti-infectives:  Anti-infectives (From admission, onward)     Start     Dose/Rate Route Frequency Ordered Stop   01/11/21 2330  ceFEPIme (MAXIPIME) 2 g in sodium chloride 0.9 % 100 mL IVPB        2 g 200 mL/hr over 30 Minutes Intravenous Every 24 hours 01/11/21 0711     01/10/21 1645  ampicillin (OMNIPEN) 2 g in sodium chloride 0.9 % 100 mL IVPB        2 g 300 mL/hr over 20 Minutes Intravenous Every 8 hours 01/10/21 1549     01/09/21 2200  ceFEPIme (MAXIPIME) 2 g in sodium chloride 0.9 % 100 mL IVPB  Status:  Discontinued        2 g 200 mL/hr over 30 Minutes Intravenous Every 12 hours 01/09/21 1458 01/11/21 0711   01/08/21 1515  metroNIDAZOLE (FLAGYL) IVPB 500 mg  Status:  Discontinued        500 mg 100 mL/hr over 60 Minutes Intravenous Every 8 hours 01/08/21 1428 01/10/21 1618   01/03/21 0600  vancomycin (VANCOREADY) IVPB 1250 mg/250 mL  Status:  Discontinued        1,250 mg 166.7 mL/hr over 90 Minutes Intravenous Every 12 hours 01/02/21 1717 01/03/21 0837   01/02/21 1800  vancomycin (VANCOREADY) IVPB 2000 mg/400 mL        2,000 mg 200 mL/hr over 120 Minutes Intravenous  Once 01/02/21 1712 01/02/21 2007   01/02/21 0900  ceFEPIme (MAXIPIME) 2 g in sodium chloride 0.9 % 100 mL IVPB  Status:  Discontinued        2 g 200 mL/hr over 30 Minutes Intravenous Every 8 hours 01/02/21 0849 01/09/21 1458      Consults: Treatment Team:  Izora Gala, MD    Studies:    Events:  Subjective:    Overnight Issues:   Objective:  Vital signs for last 24 hours: Temp:  [98.2 F (36.8 C)-99 F (37.2 C)] 98.7 F (37.1 C) (08/27 0400) Pulse Rate:  [41-81] 76 (08/27 0740) Resp:  [15-32] 27 (08/27 0740) BP: (102-196)/(49-85) 154/72 (08/27 0740) SpO2:  [91 %-99 %] 93 % (08/27 0740)  Arterial Line BP: (77-204)/(42-77) 109/55 (08/27 0700) FiO2 (%):  [40 %-100 %] 50 % (08/27 0740)  Hemodynamic parameters for last 24 hours:    Intake/Output from previous day: 08/26 0701 - 08/27 0700 In: 3601.8 [I.V.:2870.8; NG/GT:200; IV Piggyback:531] Out: 1050  [Urine:650; Stool:400]  Intake/Output this shift: No intake/output data recorded.  Vent settings for last 24 hours: Vent Mode: PRVC FiO2 (%):  [40 %-100 %] 50 % Set Rate:  [18 bmp-20 bmp] 20 bmp Vt Set:  [650 mL] 650 mL PEEP:  [5 cmH20] 5 cmH20 Pressure Support:  [10 cmH20] 10 cmH20 Plateau Pressure:  [15 [id number] cmH20] 15 cmH20  Physical Exam:  General: on vent Neuro: follows commands HEENT/Neck: ETT Resp: clear to auscultation bilaterally CVS: IRR GI: distended but softer Extremities: edema 1+  Results for orders placed or performed during the hospital encounter of 12/28/20 (from the past 24 hour(s))  Glucose, capillary     Status: None   Collection Time: 01/11/21 11:50 AM  Result Value Ref Range   Glucose-Capillary 72 70 - 99 mg/dL  APTT     Status: Abnormal   Collection Time: 01/11/21  1:40 PM  Result Value Ref Range   aPTT 46 (H) 24 - 36 seconds  Glucose, capillary     Status: Abnormal   Collection Time: 01/11/21  4:08 PM  Result Value Ref Range   Glucose-Capillary 69 (L) 70 - 99 mg/dL  Glucose, capillary     Status: Abnormal   Collection Time: 01/11/21  4:36 PM  Result Value Ref Range   Glucose-Capillary 115 (H) 70 - 99 mg/dL  APTT     Status: Abnormal   Collection Time: 01/11/21  6:21 PM  Result Value Ref Range   aPTT 52 (H) 24 - 36 seconds  Glucose, capillary     Status: None   Collection Time: 01/11/21  7:33 PM  Result Value Ref Range   Glucose-Capillary 86 70 - 99 mg/dL  Glucose, capillary     Status: Abnormal   Collection Time: 01/11/21 11:30 PM  Result Value Ref Range   Glucose-Capillary 63 (L) 70 - 99 mg/dL  Glucose, capillary     Status: Abnormal   Collection Time: 01/12/21 12:09 AM  Result Value Ref Range   Glucose-Capillary 108 (H) 70 - 99 mg/dL  Glucose, capillary     Status: None   Collection Time: 01/12/21  4:12 AM  Result Value Ref Range   Glucose-Capillary 71 70 - 99 mg/dL  CBC     Status: Abnormal   Collection Time: 01/12/21  5:52 AM   Result Value Ref Range   WBC 9.0 4.0 - 10.5 K/uL   RBC 2.85 (L) 4.22 - 5.81 MIL/uL   Hemoglobin 7.3 (L) 13.0 - 17.0 g/dL   HCT 23.9 (L) 39.0 - 52.0 %   MCV 83.9 80.0 - 100.0 fL   MCH 25.6 (L) 26.0 - 34.0 pg   MCHC 30.5 30.0 - 36.0 g/dL   RDW 17.7 (H) 11.5 - 15.5 %   Platelets 409 (H) 150 - 400 K/uL   nRBC 0.0 0.0 - 0.2 %  Basic metabolic panel     Status: Abnormal   Collection Time: 01/12/21  5:52 AM  Result Value Ref Range   Sodium 147 (H) 135 - 145 mmol/L   Potassium 3.6 3.5 - 5.1 mmol/L   Chloride 118 (H) 98 - 111 mmol/L   CO2 18 (L) 22 - 32 mmol/L   Glucose, Bld 71 70 - 99 mg/dL  BUN 124 (H) 8 - 23 mg/dL   Creatinine, Ser 4.49 (H) 0.61 - 1.24 mg/dL   Calcium 8.0 (L) 8.9 - 10.3 mg/dL   GFR, Estimated 13 (L) >60 mL/min   Anion gap 11 5 - 15  Magnesium     Status: None   Collection Time: 01/12/21  5:52 AM  Result Value Ref Range   Magnesium 2.3 1.7 - 2.4 mg/dL  Phosphorus     Status: Abnormal   Collection Time: 01/12/21  5:52 AM  Result Value Ref Range   Phosphorus 6.6 (H) 2.5 - 4.6 mg/dL  APTT     Status: Abnormal   Collection Time: 01/12/21  5:52 AM  Result Value Ref Range   aPTT 58 (H) 24 - 36 seconds  Glucose, capillary     Status: Abnormal   Collection Time: 01/12/21  8:18 AM  Result Value Ref Range   Glucose-Capillary 62 (L) 70 - 99 mg/dL    Assessment & Plan: Present on Admission: **None**    LOS: 15 days   Additional comments:I reviewed the patient's new clinical lab test results. . Fall down stairs 8/12   VDRF - guaifenisen, wean, trial of extubation ID - resp CX now with pseud and enterococcus - maxipime/ampicillin. CT A/P with ascending colitis and distention. Stool studies and C. dif are all negative TBI/SAH/SDH - NSGY c/s, Dr. Annette Stable, starting to F/C. Significant frontal lobe injuries. Keppra x7d for sz ppx. Follows commands this AM. Occipital bone fx - NSGY c/s, Dr. Annette Stable Temporal bone fx extending into middle ear - ENT c/s, Dr. Constance Holster Right TM  Rupture - ENT c/s, Dr. Constance Holster AFRVR - amio to PO, appreciate Cardiology F/U Bilateral pulmonary embolism - bival AKI - CRT up to 4.49. VF, I will consult Renal Hx DM2 - resistant SSI, glargine 43u BID (171u/24h) - held for hypoglycemia Hx HTN - PRN meds FEN - NPO, try resuming tube feeds VTE - SCDs, bival gtt Foley - removed Dispo - ICU, MS much better, trach this week I called his wife and updated her. Critical Care Total Time*: 35 Minutes  Georganna Skeans, MD, MPH, FACS Trauma & General Surgery Use [email or web address] to contact on call provider  01/12/2021  *Care during the described time interval was provided by me. I have reviewed this patient's available data, including medical history, events of note, physical examination and test results as part of my evaluation.

## 2021-01-13 ENCOUNTER — Inpatient Hospital Stay (HOSPITAL_COMMUNITY): Payer: PPO

## 2021-01-13 DIAGNOSIS — A419 Sepsis, unspecified organism: Secondary | ICD-10-CM | POA: Diagnosis not present

## 2021-01-13 DIAGNOSIS — I10 Essential (primary) hypertension: Secondary | ICD-10-CM | POA: Diagnosis not present

## 2021-01-13 DIAGNOSIS — I4819 Other persistent atrial fibrillation: Secondary | ICD-10-CM | POA: Diagnosis not present

## 2021-01-13 DIAGNOSIS — I2699 Other pulmonary embolism without acute cor pulmonale: Secondary | ICD-10-CM | POA: Diagnosis not present

## 2021-01-13 DIAGNOSIS — E1159 Type 2 diabetes mellitus with other circulatory complications: Secondary | ICD-10-CM | POA: Diagnosis not present

## 2021-01-13 DIAGNOSIS — W19XXXA Unspecified fall, initial encounter: Secondary | ICD-10-CM | POA: Diagnosis not present

## 2021-01-13 DIAGNOSIS — I609 Nontraumatic subarachnoid hemorrhage, unspecified: Secondary | ICD-10-CM | POA: Diagnosis not present

## 2021-01-13 DIAGNOSIS — J9600 Acute respiratory failure, unspecified whether with hypoxia or hypercapnia: Secondary | ICD-10-CM | POA: Diagnosis not present

## 2021-01-13 DIAGNOSIS — R579 Shock, unspecified: Secondary | ICD-10-CM | POA: Diagnosis not present

## 2021-01-13 DIAGNOSIS — I152 Hypertension secondary to endocrine disorders: Secondary | ICD-10-CM | POA: Diagnosis not present

## 2021-01-13 DIAGNOSIS — I700 Atherosclerosis of aorta: Secondary | ICD-10-CM | POA: Diagnosis not present

## 2021-01-13 DIAGNOSIS — N170 Acute kidney failure with tubular necrosis: Secondary | ICD-10-CM | POA: Diagnosis not present

## 2021-01-13 DIAGNOSIS — E8779 Other fluid overload: Secondary | ICD-10-CM | POA: Diagnosis not present

## 2021-01-13 DIAGNOSIS — Z9911 Dependence on respirator [ventilator] status: Secondary | ICD-10-CM | POA: Diagnosis not present

## 2021-01-13 DIAGNOSIS — I480 Paroxysmal atrial fibrillation: Secondary | ICD-10-CM | POA: Diagnosis not present

## 2021-01-13 DIAGNOSIS — I4891 Unspecified atrial fibrillation: Secondary | ICD-10-CM | POA: Diagnosis not present

## 2021-01-13 DIAGNOSIS — N281 Cyst of kidney, acquired: Secondary | ICD-10-CM | POA: Diagnosis not present

## 2021-01-13 DIAGNOSIS — E1129 Type 2 diabetes mellitus with other diabetic kidney complication: Secondary | ICD-10-CM | POA: Diagnosis not present

## 2021-01-13 LAB — BASIC METABOLIC PANEL
Anion gap: 11 (ref 5–15)
BUN: 134 mg/dL — ABNORMAL HIGH (ref 8–23)
CO2: 16 mmol/L — ABNORMAL LOW (ref 22–32)
Calcium: 7.8 mg/dL — ABNORMAL LOW (ref 8.9–10.3)
Chloride: 120 mmol/L — ABNORMAL HIGH (ref 98–111)
Creatinine, Ser: 4.96 mg/dL — ABNORMAL HIGH (ref 0.61–1.24)
GFR, Estimated: 12 mL/min — ABNORMAL LOW (ref >60)
Glucose, Bld: 106 mg/dL — ABNORMAL HIGH (ref 70–99)
Potassium: 3.6 mmol/L (ref 3.5–5.1)
Sodium: 147 mmol/L — ABNORMAL HIGH (ref 135–145)

## 2021-01-13 LAB — CBC
HCT: 22.4 % — ABNORMAL LOW (ref 39.0–52.0)
Hemoglobin: 6.8 g/dL — CL (ref 13.0–17.0)
MCH: 25.9 pg — ABNORMAL LOW (ref 26.0–34.0)
MCHC: 30.4 g/dL (ref 30.0–36.0)
MCV: 85.2 fL (ref 80.0–100.0)
Platelets: 432 10*3/uL — ABNORMAL HIGH (ref 150–400)
RBC: 2.63 MIL/uL — ABNORMAL LOW (ref 4.22–5.81)
RDW: 18 % — ABNORMAL HIGH (ref 11.5–15.5)
WBC: 7.5 10*3/uL (ref 4.0–10.5)
nRBC: 0.3 % — ABNORMAL HIGH (ref 0.0–0.2)

## 2021-01-13 LAB — GLUCOSE, CAPILLARY
Glucose-Capillary: 53 mg/dL — ABNORMAL LOW (ref 70–99)
Glucose-Capillary: 70 mg/dL (ref 70–99)
Glucose-Capillary: 71 mg/dL (ref 70–99)
Glucose-Capillary: 81 mg/dL (ref 70–99)
Glucose-Capillary: 87 mg/dL (ref 70–99)
Glucose-Capillary: 89 mg/dL (ref 70–99)
Glucose-Capillary: 90 mg/dL (ref 70–99)

## 2021-01-13 LAB — CREATININE, URINE, RANDOM: Creatinine, Urine: 97.12 mg/dL

## 2021-01-13 LAB — HEMOGLOBIN AND HEMATOCRIT, BLOOD
HCT: 26.3 % — ABNORMAL LOW (ref 39.0–52.0)
Hemoglobin: 7.9 g/dL — ABNORMAL LOW (ref 13.0–17.0)

## 2021-01-13 LAB — APTT
aPTT: 66 s — ABNORMAL HIGH (ref 24–36)
aPTT: 69 seconds — ABNORMAL HIGH (ref 24–36)
aPTT: 63 s — ABNORMAL HIGH (ref 24–36)

## 2021-01-13 LAB — SODIUM, URINE, RANDOM: Sodium, Ur: 26 mmol/L

## 2021-01-13 LAB — PREPARE RBC (CROSSMATCH)

## 2021-01-13 LAB — TRIGLYCERIDES: Triglycerides: 142 mg/dL (ref <150)

## 2021-01-13 LAB — ABO/RH: ABO/RH(D): O POS

## 2021-01-13 MED ORDER — SODIUM CHLORIDE 0.9% IV SOLUTION: Freq: Once | INTRAVENOUS | Status: AC

## 2021-01-13 MED ORDER — SODIUM CHLORIDE 0.9 % IV SOLN: 0.0365 mg/kg/h | INTRAVENOUS | Status: DC

## 2021-01-13 MED ORDER — SODIUM CHLORIDE 0.9 % IV SOLN
0.0320 mg/kg/h | INTRAVENOUS | Status: AC
  Administered 2021-01-13: 0.032 mg/kg/h via INTRAVENOUS
  Filled 2021-01-13 (×2): qty 250

## 2021-01-13 MED ORDER — FUROSEMIDE 10 MG/ML IJ SOLN
80.0000 mg | Freq: Three times a day (TID) | INTRAMUSCULAR | Status: DC
  Administered 2021-01-13 – 2021-01-17 (×12): 80 mg via INTRAVENOUS
  Filled 2021-01-13 (×12): qty 8

## 2021-01-13 MED ORDER — DEXTROSE 50 % IV SOLN
INTRAVENOUS | Status: AC
  Filled 2021-01-13: qty 50

## 2021-01-13 MED ORDER — DEXTROSE 50 % IV SOLN
25.0000 mL | Freq: Once | INTRAVENOUS | Status: AC
  Administered 2021-01-13: 25 mL via INTRAVENOUS

## 2021-01-13 NOTE — Progress Notes (Signed)
Patient ID: Angel [name], male   DOB: 12-18-1950, 70 y.o.   MRN: [id number] Follow up - Trauma Critical Care  Patient Details:    Angel [name] is an 70 y.o. male.  Lines/tubes : Airway 7.5 mm (Active)  Secured at (cm) 23 cm 01/13/21 0808  Measured From Lips 01/13/21 0808  Secured Location Right 01/13/21 0808  Secured By [employer] 01/13/21 0808  Tube Holder Repositioned Yes 01/13/21 0808  Prone position No 01/12/21 1954  Cuff Pressure (cm H2O) Clear OR 27-39 CmH2O 01/13/21 0808  Site Condition Dry 01/13/21 0808     PICC Triple Lumen 16/07/37 PICC Right Basilic 47 cm 1 cm (Active)  Indication for Insertion or Continuance of Line Vasoactive infusions 01/13/21 0800  Exposed Catheter (cm) 1 cm 01/09/21 0916  Site Assessment Clean;Dry;Intact 01/13/21 0800  Lumen #1 Status Infusing 01/13/21 0800  Lumen #2 Status Infusing 01/13/21 0800  Lumen #3 Status Infusing 01/13/21 0800  Dressing Type Transparent;Securing device 01/13/21 0800  Dressing Status Clean;Dry;Intact 01/13/21 0800  Antimicrobial disc in place? Yes 01/13/21 0800  Safety Lock Not Applicable 10/62/69 4854  Line Care Connections checked and tightened 01/13/21 0800  Dressing Intervention New dressing;Other (Comment) 01/09/21 0916  Dressing Change Due 01/16/21 01/13/21 0800     NG/OG Vented/Dual Lumen Oral External length of tube 49 cm (Active)  Tube Position (Required) External length of tube 01/11/21 2000  Measurement (cm) (Required) 49 cm 01/11/21 2000  Ongoing Placement Verification (Required) (See row information) Yes 01/11/21 2000  Site Assessment Clean;Dry;Intact 01/12/21 2000  Status Feeding 01/12/21 2000     External Urinary Catheter (Active)  Collection Container Leg bag 01/13/21 0800  Securement Method Securing device (Describe) 01/13/21 0800  Site Assessment Clean;Intact 01/13/21 0800  Intervention Male External Urinary Catheter Replaced 01/11/21 2000  Output (mL) 75 mL 01/13/21 0600     Fecal  Management System (Active)  Does patient meet criteria for removal? No 01/13/21 0800  Daily care Skin around tube assessed;Skin barrier applied to rectal area;Assess location of position indicator line 01/13/21 0800  Output (mL) 200 mL 01/12/21 1145  Intake (mL) 30 mL 01/06/21 0520    Microbiology/Sepsis markers: Results for orders placed or performed during the hospital encounter of 12/28/20  Resp Panel by RT-PCR (Flu A&B, Covid) Nasopharyngeal Swab     Status: None   Collection Time: 12/28/20  4:17 PM   Specimen: Nasopharyngeal Swab; Nasopharyngeal(NP) swabs in vial transport medium  Result Value Ref Range Status   SARS Coronavirus 2 by RT PCR NEGATIVE NEGATIVE Final    Comment: (NOTE) SARS-CoV-2 target nucleic acids are NOT DETECTED.  The SARS-CoV-2 RNA is generally detectable in upper respiratory specimens during the acute phase of infection. The lowest concentration of SARS-CoV-2 viral copies this assay can detect is 138 copies/mL. A negative result does not preclude SARS-Cov-2 infection and should not be used as the sole basis for treatment or other patient management decisions. A negative result may occur with  improper specimen collection/handling, submission of specimen other than nasopharyngeal swab, presence of viral mutation(s) within the areas targeted by this assay, and inadequate number of viral copies(<138 copies/mL). A negative result must be combined with clinical observations, patient history, and epidemiological information. The expected result is Negative.  Fact Sheet for Patients:  EntrepreneurPulse.com.au  Fact Sheet for Healthcare Providers:  IncredibleEmployment.be  This test is no t yet approved or cleared by the [place] FDA and  has been authorized for detection and/or diagnosis of SARS-CoV-2 by  FDA under an Emergency Use Authorization (EUA). This EUA will remain  in effect (meaning this test can be used) for  the duration of the COVID-19 declaration under Section 564(b)(1) of the Act, 21 U.S.C.section 360bbb-3(b)(1), unless the authorization is terminated  or revoked sooner.       Influenza A by PCR NEGATIVE NEGATIVE Final   Influenza B by PCR NEGATIVE NEGATIVE Final    Comment: (NOTE) The Xpert Xpress SARS-CoV-2/FLU/RSV plus assay is intended as an aid in the diagnosis of influenza from Nasopharyngeal swab specimens and should not be used as a sole basis for treatment. Nasal washings and aspirates are unacceptable for Xpert Xpress SARS-CoV-2/FLU/RSV testing.  Fact Sheet for Patients: EntrepreneurPulse.com.au  Fact Sheet for Healthcare Providers: IncredibleEmployment.be  This test is not yet approved or cleared by the [place] FDA and has been authorized for detection and/or diagnosis of SARS-CoV-2 by FDA under an Emergency Use Authorization (EUA). This EUA will remain in effect (meaning this test can be used) for the duration of the COVID-19 declaration under Section 564(b)(1) of the Act, 21 U.S.C. section 360bbb-3(b)(1), unless the authorization is terminated or revoked.  Performed at [facility], [place] [address], [place], [place] [address]   MRSA Next Gen by PCR, Nasal     Status: None   Collection Time: 12/28/20  7:32 PM   Specimen: Nasal Mucosa; Nasal Swab  Result Value Ref Range Status   MRSA by PCR Next Gen NOT DETECTED NOT DETECTED Final    Comment: (NOTE) The GeneXpert MRSA Assay (FDA approved for NASAL specimens only), is one component of a comprehensive MRSA colonization surveillance program. It is not intended to diagnose MRSA infection nor to guide or monitor treatment for MRSA infections. Test performance is not FDA approved in patients less than 13 years old. Performed at [facility], [place] [address], [place], [place] [address]   Culture, Respiratory w Gram Stain     Status: None   Collection Time: 12/31/20  11:06 AM   Specimen: Tracheal Aspirate; Respiratory  Result Value Ref Range Status   Specimen Description TRACHEAL ASPIRATE  Final   Special Requests NONE  Final   Gram Stain   Final    FEW SQUAMOUS EPITHELIAL CELLS PRESENT FEW WBC PRESENT,BOTH PMN AND MONONUCLEAR FEW GRAM POSITIVE COCCI Performed at [facility], [place] [address], [place], [place] [address]    Culture   Final    FEW PSEUDOMONAS AERUGINOSA FEW STREPTOCOCCUS PNEUMONIAE    Report Status 01/03/2021 FINAL  Final   Organism ID, Bacteria PSEUDOMONAS AERUGINOSA  Final   Organism ID, Bacteria STREPTOCOCCUS PNEUMONIAE  Final      Susceptibility   Pseudomonas aeruginosa - MIC*    CEFTAZIDIME 4 SENSITIVE Sensitive     CIPROFLOXACIN <=0.25 SENSITIVE Sensitive     GENTAMICIN <=1 SENSITIVE Sensitive     IMIPENEM 2 SENSITIVE Sensitive     PIP/TAZO 8 SENSITIVE Sensitive     CEFEPIME 2 SENSITIVE Sensitive     * FEW PSEUDOMONAS AERUGINOSA   Streptococcus pneumoniae - MIC*    ERYTHROMYCIN 4 RESISTANT Resistant     LEVOFLOXACIN 0.5 SENSITIVE Sensitive     VANCOMYCIN <=0.12 SENSITIVE Sensitive     PENO - penicillin <=0.06      PENICILLIN (non-meningitis) <=0.06 SENSITIVE Sensitive     PENICILLIN (oral) <=0.06 SENSITIVE Sensitive     CEFTRIAXONE (non-meningitis) <=0.12 SENSITIVE Sensitive     * FEW STREPTOCOCCUS PNEUMONIAE  Culture, blood (routine x 2)  Status: None   Collection Time: 01/05/21 11:44 AM   Specimen: BLOOD  Result Value Ref Range Status   Specimen Description BLOOD SITE NOT SPECIFIED  Final   Special Requests AEROBIC BOTTLE ONLY Blood Culture adequate volume  Final   Culture   Final    NO GROWTH 5 DAYS Performed at [facility], [address] [place], [place] [address]    Report Status 01/10/2021 FINAL  Final  Culture, blood (routine x 2)     Status: None   Collection Time: 01/05/21 11:44 AM   Specimen: BLOOD  Result Value Ref Range Status   Specimen Description BLOOD SITE NOT SPECIFIED   Final   Special Requests   Final    AEROBIC BOTTLE ONLY Blood Culture results may not be optimal due to an inadequate volume of blood received in culture bottles   Culture   Final    NO GROWTH 5 DAYS Performed at [facility], [place] [address], [place], [place] [address]    Report Status 01/10/2021 FINAL  Final  Surgical PCR screen     Status: None   Collection Time: 01/08/21 12:14 AM   Specimen: Nasal Mucosa; Nasal Swab  Result Value Ref Range Status   MRSA, PCR NEGATIVE NEGATIVE Final   Staphylococcus aureus NEGATIVE NEGATIVE Final    Comment: (NOTE) The Xpert SA Assay (FDA approved for NASAL specimens in patients 35 years of age and older), is one component of a comprehensive surveillance program. It is not intended to diagnose infection nor to guide or monitor treatment. Performed at [facility], [place] [address] [place], [place] [address]   Culture, Respiratory w Gram Stain     Status: None   Collection Time: 01/08/21  1:24 PM   Specimen: Tracheal Aspirate; Respiratory  Result Value Ref Range Status   Specimen Description TRACHEAL ASPIRATE  Final   Special Requests NONE  Final   Gram Stain   Final    RARE SQUAMOUS EPITHELIAL CELLS PRESENT MODERATE WBC PRESENT, PREDOMINANTLY MONONUCLEAR FEW GRAM NEGATIVE RODS Performed at [facility], [place] [address], [place],  [address]    Culture   Final    RARE PSEUDOMONAS AERUGINOSA RARE ENTEROCOCCUS FAECALIS    Report Status 01/11/2021 FINAL  Final   Organism ID, Bacteria PSEUDOMONAS AERUGINOSA  Final   Organism ID, Bacteria ENTEROCOCCUS FAECALIS  Final      Susceptibility   Enterococcus faecalis - MIC*    AMPICILLIN <=2 SENSITIVE Sensitive     VANCOMYCIN 1 SENSITIVE Sensitive     GENTAMICIN SYNERGY SENSITIVE Sensitive     * RARE ENTEROCOCCUS FAECALIS   Pseudomonas aeruginosa - MIC*    CEFTAZIDIME 4 SENSITIVE Sensitive     CIPROFLOXACIN <=0.25 SENSITIVE Sensitive     GENTAMICIN <=1 SENSITIVE Sensitive      IMIPENEM 2 SENSITIVE Sensitive     PIP/TAZO 8 SENSITIVE Sensitive     CEFEPIME 2 SENSITIVE Sensitive     * RARE PSEUDOMONAS AERUGINOSA  Gastrointestinal Panel by PCR , Stool     Status: None   Collection Time: 01/08/21  5:04 PM   Specimen: Stool  Result Value Ref Range Status   Campylobacter species NOT DETECTED NOT DETECTED Final   Plesimonas shigelloides NOT DETECTED NOT DETECTED Final   Salmonella species NOT DETECTED NOT DETECTED Final   Yersinia enterocolitica NOT DETECTED NOT DETECTED Final   Vibrio species NOT DETECTED NOT DETECTED Final   Vibrio cholerae NOT DETECTED NOT DETECTED  Final   Enteroaggregative E coli (EAEC) NOT DETECTED NOT DETECTED Final   Enteropathogenic E coli (EPEC) NOT DETECTED NOT DETECTED Final   Enterotoxigenic E coli (ETEC) NOT DETECTED NOT DETECTED Final   Shiga like toxin producing E coli (STEC) NOT DETECTED NOT DETECTED Final   Shigella/Enteroinvasive E coli (EIEC) NOT DETECTED NOT DETECTED Final   Cryptosporidium NOT DETECTED NOT DETECTED Final   Cyclospora cayetanensis NOT DETECTED NOT DETECTED Final   Entamoeba histolytica NOT DETECTED NOT DETECTED Final   Giardia lamblia NOT DETECTED NOT DETECTED Final   Adenovirus F40/41 NOT DETECTED NOT DETECTED Final   Astrovirus NOT DETECTED NOT DETECTED Final   Norovirus GI/GII NOT DETECTED NOT DETECTED Final   Rotavirus A NOT DETECTED NOT DETECTED Final   Sapovirus (I, II, IV, and V) NOT DETECTED NOT DETECTED Final    Comment: Performed at [facility], [place] [place], [place] [address]  C Difficile Quick Screen (NO PCR Reflex)     Status: None   Collection Time: 01/09/21 11:07 AM   Specimen: STOOL  Result Value Ref Range Status   C Diff antigen NEGATIVE NEGATIVE Final   C Diff toxin NEGATIVE NEGATIVE Final   C Diff interpretation No C. difficile detected.  Final    Comment: Performed at [facility], [place] [address] [place], [place] [address]    Anti-infectives:   Anti-infectives (From admission, onward)    Start     Dose/Rate Route Frequency Ordered Stop   01/11/21 2330  ceFEPIme (MAXIPIME) 2 g in sodium chloride 0.9 % 100 mL IVPB        2 g 200 mL/hr over 30 Minutes Intravenous Every 24 hours 01/11/21 0711     01/10/21 1645  ampicillin (OMNIPEN) 2 g in sodium chloride 0.9 % 100 mL IVPB        2 g 300 mL/hr over 20 Minutes Intravenous Every 8 hours 01/10/21 1549     01/09/21 2200  ceFEPIme (MAXIPIME) 2 g in sodium chloride 0.9 % 100 mL IVPB  Status:  Discontinued        2 g 200 mL/hr over 30 Minutes Intravenous Every 12 hours 01/09/21 1458 01/11/21 0711   01/08/21 1515  metroNIDAZOLE (FLAGYL) IVPB 500 mg  Status:  Discontinued        500 mg 100 mL/hr over 60 Minutes Intravenous Every 8 hours 01/08/21 1428 01/10/21 1618   01/03/21 0600  vancomycin (VANCOREADY) IVPB 1250 mg/250 mL  Status:  Discontinued        1,250 mg 166.7 mL/hr over 90 Minutes Intravenous Every 12 hours 01/02/21 1717 01/03/21 0837   01/02/21 1800  vancomycin (VANCOREADY) IVPB 2000 mg/400 mL        2,000 mg 200 mL/hr over 120 Minutes Intravenous  Once 01/02/21 1712 01/02/21 2007   01/02/21 0900  ceFEPIme (MAXIPIME) 2 g in sodium chloride 0.9 % 100 mL IVPB  Status:  Discontinued        2 g 200 mL/hr over 30 Minutes Intravenous Every 8 hours 01/02/21 0849 01/09/21 1458       Best Practice/Protocols:  VTE Prophylaxis: Direct Thrombin Inhibitor Continous Sedation  Consults: Treatment Team:  Izora Gala, MD    Studies:    Events:  Subjective:    Overnight Issues:   Objective:  Vital signs for last 24 hours: Temp:  [96.9 F (36.1 C)-98.3 F (36.8 C)] 97.4 F (36.3 C) (08/28 0831) Pulse Rate:  [40-63] 50 (08/28 0831) Resp:  [18-20] 20 (  08/28 0831) BP: (113-170)/(49-70) 139/51 (08/28 0831) SpO2:  [93 %-99 %] 93 % (08/28 0831) Arterial Line BP: (114-176)/(48-72) 138/52 (08/28 0831) FiO2 (%):  [40 %-50 %] 40 % (08/28 0808) Weight:  [138.6 kg] 138.6 kg  (08/28 0400)  Hemodynamic parameters for last 24 hours:    Intake/Output from previous day: 08/27 0701 - 08/28 0700 In: 3595 [I.V.:3010; NG/GT:285; IV Piggyback:300] Out: 710 [Urine:510; Stool:200]  Intake/Output this shift: Total I/O In: 364.9 [I.V.:234.9; NG/GT:30; IV Piggyback:100] Out: -   Vent settings for last 24 hours: Vent Mode: PRVC FiO2 (%):  [40 %-50 %] 40 % Set Rate:  [0 bmp-20 bmp] 20 bmp Vt Set:  [650 mL] 650 mL PEEP:  [5 cmH20] 5 cmH20 Plateau Pressure:  [26 cmH20-31 cmH20] 26 cmH20  Physical Exam:  General: on vent Neuro: arouses and F/C well HEENT/Neck: ETT Resp: clear to auscultation bilaterally CVS: IRR GI: distended but soft Extremities: edema 1+  Results for orders placed or performed during the hospital encounter of 12/28/20 (from the past 24 hour(s))  Glucose, capillary     Status: None   Collection Time: 01/12/21 12:32 PM  Result Value Ref Range   Glucose-Capillary 73 70 - 99 mg/dL  Glucose, capillary     Status: None   Collection Time: 01/12/21  4:04 PM  Result Value Ref Range   Glucose-Capillary 76 70 - 99 mg/dL  Glucose, capillary     Status: None   Collection Time: 01/12/21  7:29 PM  Result Value Ref Range   Glucose-Capillary 72 70 - 99 mg/dL  Glucose, capillary     Status: Abnormal   Collection Time: 01/12/21 11:49 PM  Result Value Ref Range   Glucose-Capillary 68 (L) 70 - 99 mg/dL  Glucose, capillary     Status: None   Collection Time: 01/13/21  3:25 AM  Result Value Ref Range   Glucose-Capillary 90 70 - 99 mg/dL  CBC     Status: Abnormal   Collection Time: 01/13/21  5:00 AM  Result Value Ref Range   WBC 7.5 4.0 - 10.5 K/uL   RBC 2.63 (L) 4.22 - 5.81 MIL/uL   Hemoglobin 6.8 (LL) 13.0 - 17.0 g/dL   HCT 22.4 (L) 39.0 - 52.0 %   MCV 85.2 80.0 - 100.0 fL   MCH 25.9 (L) 26.0 - 34.0 pg   MCHC 30.4 30.0 - 36.0 g/dL   RDW 18.0 (H) 11.5 - 15.5 %   Platelets 432 (H) 150 - 400 K/uL   nRBC 0.3 (H) 0.0 - 0.2 %  Basic metabolic panel      Status: Abnormal   Collection Time: 01/13/21  5:00 AM  Result Value Ref Range   Sodium 147 (H) 135 - 145 mmol/L   Potassium 3.6 3.5 - 5.1 mmol/L   Chloride 120 (H) 98 - 111 mmol/L   CO2 16 (L) 22 - 32 mmol/L   Glucose, Bld 106 (H) 70 - 99 mg/dL   BUN 134 (H) 8 - 23 mg/dL   Creatinine, Ser 4.96 (H) 0.61 - 1.24 mg/dL   Calcium 7.8 (L) 8.9 - 10.3 mg/dL   GFR, Estimated 12 (L) >60 mL/min   Anion gap 11 5 - 15  APTT     Status: Abnormal   Collection Time: 01/13/21  5:00 AM  Result Value Ref Range   aPTT 66 (H) 24 - 36 seconds  Triglycerides     Status: None   Collection Time: 01/13/21  5:00 AM  Result Value Ref Range  Triglycerides 142 <150 mg/dL  Type and screen [place]     Status: None (Preliminary result)   Collection Time: 01/13/21  6:45 AM  Result Value Ref Range   ABO/RH(D) O POS    Antibody Screen NEG    Sample Expiration 01/16/2021,2359    Unit Number [id number]    Blood Component Type RBC LR PHER2    Unit division 00    Status of Unit ISSUED    Transfusion Status OK TO TRANSFUSE    Crossmatch Result      Compatible Performed at [facility], [place] [address], [place], [place] [address]   Prepare RBC (crossmatch)     Status: None   Collection Time: 01/13/21  7:10 AM  Result Value Ref Range   Order Confirmation      ORDER PROCESSED BY BLOOD BANK Performed at [facility], [place] [address], [place], [place] [address]   Glucose, capillary     Status: None   Collection Time: 01/13/21  7:45 AM  Result Value Ref Range   Glucose-Capillary 81 70 - 99 mg/dL    Assessment & Plan: Present on Admission: **None**    LOS: 16 days   Additional comments:I reviewed the patient's new clinical lab test results. . Fall down stairs 8/12   VDRF - guaifenisen, wean, trial of extubation ID - resp CX now with pseud and enterococcus - maxipime/ampicillin. CT A/P with ascending colitis and distention. Stool studies and C. dif are all  negative TBI/SAH/SDH - NSGY c/s, Dr. Annette Stable, starting to F/C. Significant frontal lobe injuries. Keppra x7d for sz ppx. Follows commands this AM. Occipital bone fx - NSGY c/s, Dr. Annette Stable Temporal bone fx extending into middle ear - ENT c/s, Dr. Constance Holster Right TM Rupture - ENT c/s, Dr. Constance Holster AFRVR - amio to PO, appreciate Cardiology F/U ABL anemia - 1u PRBC now Bilateral pulmonary embolism - bival AKI - CRT up to 4.96. VF, I consulted Renal Hx DM2 - resistant SSI, glargine 43u BID (171u/24h)  Hx HTN - PRN meds FEN - NPO, resumed TF yest, will hold at [facility] for OR VTE - SCDs, bival gtt Foley - removed Dispo - ICU, MS much better, trach tomorrow Critical Care Total Time*: 34 Minutes  Georganna Skeans, MD, MPH, FACS Trauma & General Surgery Use [email or web address] to contact on call provider  01/13/2021  *Care during the described time interval was provided by me. I have reviewed this patient's available data, including medical history, events of note, physical examination and test results as part of my evaluation.

## 2021-01-13 NOTE — Progress Notes (Signed)
Progress Note  Patient Name: Angel [name] Date of Encounter: 01/13/2021  Primary Cardiologist: Werner Lean, MD   Subjective   Since last evaluation has had trials of extubation that have required re-intubation.  Has had worsening creatinine.  Had bradycardia with this AM and around noon with increased sedation.  Inpatient Medications    Scheduled Meds:  acetaminophen  1,000 mg Per Tube Q6H   amiodarone  200 mg Per Tube Daily   bethanechol  25 mg Per Tube TID   chlorhexidine gluconate (MEDLINE KIT)  15 mL Mouth Rinse BID   Chlorhexidine Gluconate Cloth  6 each Topical Q0600   docusate  100 mg Per Tube BID   fentaNYL (SUBLIMAZE) injection  25 mcg Intravenous Once   free water  200 mL Per Tube Q8H   guaiFENesin  10 mL Per Tube Q4H   insulin aspart  0-20 Units Subcutaneous Q4H   insulin aspart  10 Units Subcutaneous Q4H   insulin glargine-yfgn  43 Units Subcutaneous BID   mouth rinse  15 mL Mouth Rinse 10 times per day   methocarbamol  1,000 mg Per Tube Q8H   pantoprazole sodium  40 mg Per Tube Daily   polyethylene glycol  17 g Per Tube Daily   senna  1 tablet Per Tube Daily   sodium chloride flush  10-40 mL Intracatheter Q12H   Continuous Infusions:  sodium chloride     sodium chloride 100 mL/hr at 01/13/21 1101   ampicillin (OMNIPEN) IV Stopped (01/13/21 0650)   bivalirudin (ANGIOMAX) infusion 0.5 mg/mL (Non-ACS indications)     ceFEPime (MAXIPIME) IV Stopped (01/13/21 0556)   clevidipine Stopped (01/11/21 1447)   dexmedetomidine (PRECEDEX) IV infusion Stopped (01/12/21 1224)   feeding supplement (PIVOT 1.5 CAL) 15 mL/hr at 01/13/21 0800   fentaNYL infusion INTRAVENOUS 200 mcg/hr (01/13/21 0800)   norepinephrine (LEVOPHED) Adult infusion Stopped (01/11/21 0900)   propofol (DIPRIVAN) infusion 20 mcg/kg/min (01/13/21 1047)   PRN Meds: Place/Maintain arterial line **AND** sodium chloride, fentaNYL, fentaNYL (SUBLIMAZE) injection, hydrALAZINE, labetalol,  metoprolol tartrate, ondansetron **OR** ondansetron (ZOFRAN) IV, oxyCODONE, sodium chloride flush, sodium phosphate   Vital Signs    Vitals:   01/13/21 0900 01/13/21 1000 01/13/21 1045 01/13/21 1100  BP: (!) 162/61 (!) 143/62 133/65 130/69  Pulse: (!) 58 (!) 57 (!) 49 (!) 53  Resp: (!) 21 20 20 20   Temp:    (!) 97.2 F (36.2 C)  TempSrc:    Axillary  SpO2: 92% 92% 94% 95%  Weight:      Height:        Intake/Output Summary (Last 24 hours) at 01/13/2021 1222 Last data filed at 01/13/2021 1140 Gross per 24 hour  Intake 3276.41 ml  Output 635 ml  Net 2641.41 ml   Filed Weights   01/08/21 0500 01/09/21 0348 01/13/21 0400  Weight: 130.5 kg 131 kg (!) 138.6 kg    Telemetry    SR to sinus bradycardia lowest 38 - Personally Reviewed  ECG    No new - Personally Reviewed  Physical Exam   General: Well developed, well nourished, male moderaetly sedated on the vent Head: Eyes PERRLA, Head normocephalic and atraumatic Lungs: Mechanical breath sounds Heart: regular bradycardia S1 S2, without rub or gallop. No murmur. upper extremity pulses are 2+ & equal. No JVD seen 2nd body habitus. Abdomen: Bowel sounds are minimal, abdomen firm and distended, dull to percussion Msk: Not able to assess strength and tone  Extremities: No clubbing, cyanosis, 2+ edema.  Skin:  No rashes or lesions noted. Neuro: opens eyes    Labs    Chemistry Recent Labs  Lab 01/10/21 1346 01/11/21 0603 01/12/21 0552 01/13/21 0500  NA 141 146* 147* 147*  K 3.5 3.2* 3.6 3.6  CL 111 115* 118* 120*  CO2 20* 19* 18* 16*  GLUCOSE 317* 119* 71 106*  BUN 94* 112* 124* 134*  CREATININE 2.77* 3.43* 4.49* 4.96*  CALCIUM 7.9* 8.2* 8.0* 7.8*  PROT 5.7*  --   --   --   ALBUMIN 1.7*  --   --   --   AST 30  --   --   --   ALT 34  --   --   --   ALKPHOS 53  --   --   --   BILITOT 0.4  --   --   --   GFRNONAA 24* 18* 13* 12*  ANIONGAP 10 12 11 11      Hematology Recent Labs  Lab 01/11/21 0603  01/12/21 0552 01/13/21 0500  WBC 14.6* 9.0 7.5  RBC 3.17* 2.85* 2.63*  HGB 8.0* 7.3* 6.8*  HCT 26.1* 23.9* 22.4*  MCV 82.3 83.9 85.2  MCH 25.2* 25.6* 25.9*  MCHC 30.7 30.5 30.4  RDW 17.3* 17.7* 18.0*  PLT 396 409* 432*    Cardiac EnzymesNo results for input(s): TROPONINI in the last 168 hours. No results for input(s): TROPIPOC in the last 168 hours.   BNPNo results for input(s): BNP, PROBNP in the last 168 hours.   DDimer No results for input(s): DDIMER in the last 168 hours.   Radiology    DG CHEST PORT 1 VIEW  Result Date: 01/11/2021 CLINICAL DATA:  Reintubated EXAM: PORTABLE CHEST 1 VIEW COMPARISON:  Chest radiograph 1 day prior FINDINGS: There is an endotracheal tube in place with the tip terminating approximately 2.6 cm from the carina. A right upper extremity PICC is in place. The tip is not well delineated due to poor penetration. The heart is enlarged and the mediastinum is prominent, similar to the prior study. There are diffusely increased interstitial opacities, not significantly changed compared to the prior study allowing for difference in patient rotation. The right costophrenic angle is cut off. There is no significant left effusion. There is no pneumothorax. IMPRESSION: 1. Endotracheal tube approximately 2.6 cm from the carina. 2. Diffuse interstitial opacities suggesting pulmonary edema, overall not significantly changed allowing for difference in technique. Electronically Signed   By: Valetta Mole M.D.   On: 01/11/2021 15:59   DG Abd Portable 1V  Result Date: 01/11/2021 CLINICAL DATA:  OG tube placement EXAM: PORTABLE ABDOMEN - 1 VIEW COMPARISON:  01/09/2021 FINDINGS: Enteric tube tip overlies the distal stomach/gastroduodenal junction. Upper gas pattern is unremarkable IMPRESSION: Esophageal tube tip overlies the distal stomach/gastroduodenal junction Electronically Signed   By: Donavan Foil M.D.   On: 01/11/2021 16:15    Cardiac Studies   Echo 01/07/21: 1. Left  ventricular ejection fraction, by estimation, is 60 to 65%. The  left ventricle has normal function. The left ventricle has no regional  wall motion abnormalities. There is mild left ventricular hypertrophy.  Left ventricular diastolic parameters  are indeterminate.   2. Right ventricule is poorly visualized but grossly normal size and  systolic function   3. Left atrial size was mildly dilated.   4. Right atrial size was mildly dilated.   5. The mitral valve is normal in structure. No evidence of mitral valve  regurgitation. No evidence of mitral  stenosis.   6. The aortic valve was not well visualized. Aortic valve regurgitation  is not visualized. No aortic stenosis is present.   CTPE: Date: 01/08/21 Results: A. Bilateral PE B. 3V CAC and Aortic Atherosclerosis C. Multifocal lung consolidation D. Bilateral pleural effusion  Patient Profile     70 y.o. male with a hx of hypertension, diabetes mellitus and hyperlipidemia was admitted 12/28/2020 after fall, w/ TBI/SAH/SDH. Cards saw 01/07/2021 for the evaluation of atrial fibrillation.   Pt hospitalized after falling down 8 stairs and suffering SAH and occipital and temporal bone fractures. Afib noted 01/04/21 and started on amiodarone.    Assessment & Plan   Afib RVR New Pulmonary embolism - on Bival - through course had has IV-> PO amiodarone and converted to SR - bradycardia likely mediated by electrolyte disturbances and amiodarone; presently SR and holding Amidarone 200 mg PO daily   SAH - per Trauma MD and NS   Respiratory infection Hypoxic respiratory failure - currently on Omnipen, cefepime, per primary - renal function worsening    Aortic Atherosclerosis and Coronary artery calcifications - eval as outpt, no meds right now   Hypokalemia: - per Trauma team  For questions or updates, please contact [facility] Please consult [email or web address] for contact info under Cardiology/STEMI.      Signed, Werner Lean, MD  01/13/2021, 12:22 PM

## 2021-01-13 NOTE — Consult Note (Signed)
Renal Service Consult Note [facility]  Angel [name] 01/13/2021 Angel Blazing, MD Requesting Physician: Dr. Grandville [name]  Reason for Consult: AKI HPI: The patient is a 70 y.o. year-old w/ hx of DM2, HTN, HL who was found at the bottom of approx 8 steps at home, he was confused on arrival.  He was intubated in ED. CT showed areas of SAH bilaterally, along w/ some skull bone fractures. Seen by neurosurg, no surgery indicated. Complicated by resp infection growing strep/ pseudomonas rx'd w/  IV abx. Got 7d IV keppra as sz proph. He had afib w/ RVR rx'd w/ amiodarone, DM2 rx'd w/ lantus insluin and SSI. Cortrak and TF's ongoing. Around 8/22-23 had drop in BP's requiring pressor support for about 24 hrs.  CT scans of chest and abdomen were done 8/23 w/ IV contrast. Admit creat was 0.9 and stable until 8/23 was up to 1.28 and since then has continued to rise up to 4.5 yest and 4.96 today.  Asked to see for renal failure.    UOP  > 1 L per day up until 8/25 800 cc, 8/26 450 cc and yest 8/27 650 cc .  Today 325 cc so far since MN.  Admit wt was 133kg , today is 138kg.    Pt seen in room. Per family pt was not responsive for the 1st week or so, then was improving earlier this week, but maybe less responsive the last 2 days. Pt is on sedating medications.   Per pmd Dr Angel [name], their team released sedation briefly and were able to get patient to respond appropriately this morning. No myoclonic jerking either.     ROS - n/a   Past Medical History  Past Medical History:  Diagnosis Date   DM (diabetes mellitus) ([place])    HLD (hyperlipidemia)    Hypertension    Past Surgical History The histories are not reviewed yet. Please review them in the "History" navigator section and refresh this [facility]. Family History No family history on file. Social History  has no history on file for tobacco use, alcohol use, and drug use. Allergies No Known Allergies Home medications Prior to Admission  medications   Medication Sig Start Date End Date Taking? Authorizing Provider  acetaminophen (TYLENOL 8 HOUR ARTHRITIS PAIN) 650 MG CR tablet Take 650 mg by mouth every 8 (eight) hours as needed for pain.   Yes [provider]  allopurinol (ZYLOPRIM) 300 MG tablet Take 300 mg by mouth at bedtime. 11/27/20  Yes [provider]  aspirin EC 81 MG tablet Take 81 mg by mouth at bedtime. Swallow whole.   Yes [provider]  Coenzyme Q10 (COQ10) 100 MG CAPS Take 100 mg by mouth at bedtime.   Yes [provider]  diphenhydrAMINE (BENADRYL) 25 MG tablet Take 25 mg by mouth at bedtime as needed (congestion).   Yes [provider]  Flaxseed, Linseed, (FLAX SEED OIL) 1000 MG CAPS Take 1,000 mg by mouth 2 (two) times daily.   Yes [provider]  fluticasone (FLONASE) 50 MCG/ACT nasal spray Place 2 sprays into both nostrils daily as needed for allergies or rhinitis (congestion). 11/27/20  Yes [provider]  glipiZIDE (GLUCOTROL XL) 2.5 MG 24 hr tablet Take 2.5 mg by mouth daily with breakfast. 11/27/20  Yes [provider]  GLUCOSAMINE-CHONDROITIN-MSM PO Take 1 tablet by mouth daily.   Yes [provider]  ibuprofen (ADVIL) 200 MG tablet Take 200-400 mg by mouth every 6 (six)  hours as needed for headache (pain).   Yes [provider]  linagliptin (TRADJENTA) 5 MG TABS tablet Take 5 mg by mouth every morning.   Yes [provider]  metFORMIN (GLUCOPHAGE) 1000 MG tablet Take 1,000 mg by mouth 2 (two) times daily. 11/27/20  Yes [provider]  Omega-3 Fatty Acids (FISH OIL) 1200 MG CAPS Take 1,200 mg by mouth 2 (two) times daily.   Yes [provider]  omeprazole (PRILOSEC) 20 MG capsule Take 20 mg by mouth at bedtime. 11/27/20  Yes [provider]  pioglitazone (ACTOS) 45 MG tablet Take 45 mg by mouth at bedtime. 11/27/20  Yes [provider]  quinapril (ACCUPRIL) 40 MG tablet Take  40 mg by mouth at bedtime. 11/27/20  Yes [provider]  rosuvastatin (CRESTOR) 10 MG tablet Take 10 mg by mouth at bedtime. 11/27/20  Yes [provider]     Vitals:   01/13/21 1000 01/13/21 1045 01/13/21 1100 01/13/21 1200  BP: (!) 143/62 133/65 130/69 113/61  Pulse: (!) 57 (!) 49 (!) 53 (!) 38  Resp: 20 20 20 20   Temp:   (!) 97.2 F (36.2 C)   TempSrc:   Axillary   SpO2: 92% 94% 95% 95%  Weight:      Height:       Exam Gen on vent, sedated No rash, cyanosis or gangrene Sclera anicteric, throat w/ ETT No jvd or bruits Chest clear anterior/ lateral RRR no MRG Abd soft ntnd no mass or ascites +bs GU normal MS no joint effusions or deformity Ext diffuse 2-3+ bilat LE > UE edema, no wounds or ulcers Neuro is on vent, sedated; held propofol for a bit and patient was opening his eyes to voice reqeust and moving LE's purposefully, no myoclonic jerking noted       Home meds include zyloprim, asa, glipizide, advil prn, tradjenta, metformin, prilosec, actos, quinapril, crestor, prn's     UA 8/20- negative    UNa, UCr pend     Renal US - pending      CXR - IMPRESSION: Diffuse interstitial opacities suggesting pulmonary edema, overall not significantly changed allowing for difference in technique      HR 40- 55, BP 118- 145/ 55- 75  RR 20 on vent 40% FiO2     Assessment/ Plan: AKI - likely due to hypotensive episode/ sepsis earlier in hosp stay complicated by contrast injury.  BP's stable now.  Nonoliguric, patient making urine. UA negative, urine lytes and renal US will be ordered. Creat may be starting to level off.  Pt is able to respond when sedation held and no signs of myoclonus. Pt has sig vol overload w/ some edema on CXR. Would not recommend RRT yet, but will give IV lasix tid for volume overload and dc NS at 100/hr. Will reassess on a daily basis any need for RRT, have d/w Gen Surgery. Will follow.  Fall/ bilat SAH - no surgery required, fall was on  12/28/20 Resp failure - vent support since admission Resp infection - grew strep/ pseudomonas from resp secretions and was rx'd w/ IV abx from 8/17 to current (only 1 dose IV vanc given on 8/17 then dc'd).  Atrial fib / RVR - in sinus rhythm now Volume overload - as above      Kelly Splinter  MD 01/13/2021, 12:38 PM  Recent Labs  Lab 01/12/21 0552 01/13/21 0500  WBC 9.0 7.5  HGB 7.3* 6.8*   Recent Labs  Lab  01/11/21 0603 01/12/21 0552 01/13/21 0500  K 3.2* 3.6 3.6  BUN 112* 124* 134*  CREATININE 3.43* 4.49* 4.96*  CALCIUM 8.2* 8.0* 7.8*  PHOS 5.4* 6.6*  --

## 2021-01-13 NOTE — Progress Notes (Addendum)
[place] for bivalirudin Indication: atrial fibrillation, DVT, PE 8/23   No Known Allergies  Patient Measurements: Height: 6\' 2"  (188 cm) Weight: (!) 138.6 kg (305 lb 8.9 oz) IBW/kg (Calculated) : 82.2 Heparin Dosing Weight: 107kg  Vital Signs: Temp: 97.2 F (36.2 C) (08/28 1100) Temp Source: Axillary (08/28 1100) BP: 113/61 (08/28 1200) Pulse Rate: 38 (08/28 1200)  Labs: Recent Labs    01/10/21 1518 01/10/21 1811 01/11/21 0603 01/11/21 1340 01/12/21 0552 01/13/21 0500 01/13/21 1245  HGB  --    < > 8.0*  --  7.3* 6.8* 7.9*  HCT  --    < > 26.1*  --  23.9* 22.4* 26.3*  PLT  --   --  396  --  409* 432*  --   APTT  --    < > 74*   < > 58* 66* 69*  CREATININE  --   --  3.43*  --  4.49* 4.96*  --   TROPONINIHS 56*  --   --   --   --   --   --    < > = values in this interval not displayed.    Estimated Creatinine Clearance: 20.5 mL/min (A) (by C-G formula based on SCr of 4.96 mg/dL (H)).   Assessment: 33 YOM presenting s/p fall with TBI/SAH and facial fx, in afib started on amiodarone and now cleared per trauma for full dose anticoagulation. 8/23 patient found to have small acute bilateral PE and age-indeterminate LUE DVT. Pharmacy consulted to dose bivalirudin per Trauma.  Given recent head bleed, will aim for middle of therapeutic range aptt and watch for signs and symptoms of bleeding closely.    aPTT 66 is just supratherapeutic, up trending likely due to worsening renal function. Hgb trending down x3 days but no visible bleeding per RN. No issues with infusion per RN.   Goal of Therapy:  Aptt goal ~50-65s per discussion with Trauma ([place]) Monitor platelets by anticoagulation protocol: Yes   Plan:  Decrease bivalirudin to 0.0365 mg/kg/hr  F/u 4hr aPTT  F/u daily aPTT, CBC, s/s bleeding Watch worsening renal function, which will decrease bival clearance   ADDENDUM: Will hold Bival 8/29 6am for trach per MD  4 hour aPTT  69 is supratherapeutic and trended up despite dose decrease, likely due to renal function.  >> Decrease bivalirudin to 0.032  mg/kg/hr  >> F/u 4hr aPTT   Benetta Spar, PharmD, BCPS, [facility] Clinical Pharmacist  Please check AMION for all [place] phone numbers After 10:00 PM, call [place] [phone]

## 2021-01-13 NOTE — Progress Notes (Signed)
[place] for bivalirudin Indication: atrial fibrillation, DVT, PE 8/23   No Known Allergies  Patient Measurements: Height: 6\' 2"  (188 cm) Weight: (!) 139.1 kg (306 lb 10.6 oz) IBW/kg (Calculated) : 82.2 Heparin Dosing Weight: 107kg  Vital Signs: Temp: 97.2 F (36.2 C) (08/28 1100) Temp Source: Axillary (08/28 1100) BP: 109/69 (08/28 1800) Pulse Rate: 85 (08/28 1800)  Labs: Recent Labs    01/11/21 0603 01/11/21 1340 01/12/21 0552 01/13/21 0500 01/13/21 1245 01/13/21 1755  HGB 8.0*  --  7.3* 6.8* 7.9*  --   HCT 26.1*  --  23.9* 22.4* 26.3*  --   PLT 396  --  409* 432*  --   --   APTT 74*   < > 58* 66* 69* 63*  CREATININE 3.43*  --  4.49* 4.96*  --   --    < > = values in this interval not displayed.     Estimated Creatinine Clearance: 20.6 mL/min (A) (by C-G formula based on SCr of 4.96 mg/dL (H)).   Assessment: 48 YOM presenting s/p fall with TBI/SAH and facial fx, in afib started on amiodarone and now cleared per trauma for full dose anticoagulation. 8/23 patient found to have small acute bilateral PE and age-indeterminate LUE DVT. Pharmacy consulted to dose bivalirudin per Trauma.  Given recent head bleed, will aim for middle of therapeutic range aptt and watch for signs and symptoms of bleeding closely.    Repeat aPTT is therapeutic at 63 seconds. Planning to hold bivalirudin at 0600 tomorrow 8/29 for trach revision.   Goal of Therapy:  Aptt goal ~50-65s per discussion with Trauma ([place]) Monitor platelets by anticoagulation protocol: Yes   Plan:  Continue bivalirudin 0.032 mg/kg/h - stop at 0600 8/29  Arrie Senate, PharmD, BCPS, [facility] Clinical Pharmacist [phone] Please check AMION for all [place] numbers 01/13/2021

## 2021-01-13 NOTE — Progress Notes (Signed)
Spoke with Dr. [name] at bedside regarding OG placement. External length of OG measured previously on 8/26 as 49 cm. 8/28 measurement 54 cm. Instructed by [name] to advance OG tube to marked point. No other orders placed. New external length 40 cm.

## 2021-01-14 ENCOUNTER — Inpatient Hospital Stay (HOSPITAL_COMMUNITY): Payer: PPO

## 2021-01-14 ENCOUNTER — Inpatient Hospital Stay (HOSPITAL_COMMUNITY): Payer: PPO | Admitting: Certified Registered Nurse Anesthetist

## 2021-01-14 ENCOUNTER — Encounter (HOSPITAL_COMMUNITY): Admission: EM | Disposition: A | Discharge status: Still In Hospital | Payer: Self-pay | Source: Home / Self Care

## 2021-01-14 DIAGNOSIS — R001 Bradycardia, unspecified: Secondary | ICD-10-CM | POA: Diagnosis not present

## 2021-01-14 DIAGNOSIS — I480 Paroxysmal atrial fibrillation: Secondary | ICD-10-CM | POA: Diagnosis not present

## 2021-01-14 DIAGNOSIS — Z20822 Contact with and (suspected) exposure to covid-19: Secondary | ICD-10-CM | POA: Diagnosis not present

## 2021-01-14 DIAGNOSIS — J9601 Acute respiratory failure with hypoxia: Secondary | ICD-10-CM | POA: Diagnosis not present

## 2021-01-14 DIAGNOSIS — I509 Heart failure, unspecified: Secondary | ICD-10-CM | POA: Diagnosis not present

## 2021-01-14 DIAGNOSIS — I2699 Other pulmonary embolism without acute cor pulmonale: Secondary | ICD-10-CM | POA: Diagnosis not present

## 2021-01-14 DIAGNOSIS — S066X9A Traumatic subarachnoid hemorrhage with loss of consciousness of unspecified duration, initial encounter: Secondary | ICD-10-CM | POA: Diagnosis not present

## 2021-01-14 DIAGNOSIS — J90 Pleural effusion, not elsewhere classified: Secondary | ICD-10-CM | POA: Diagnosis not present

## 2021-01-14 DIAGNOSIS — J9690 Respiratory failure, unspecified, unspecified whether with hypoxia or hypercapnia: Secondary | ICD-10-CM | POA: Diagnosis not present

## 2021-01-14 DIAGNOSIS — J9811 Atelectasis: Secondary | ICD-10-CM | POA: Diagnosis not present

## 2021-01-14 DIAGNOSIS — I517 Cardiomegaly: Secondary | ICD-10-CM | POA: Diagnosis not present

## 2021-01-14 DIAGNOSIS — I4819 Other persistent atrial fibrillation: Secondary | ICD-10-CM | POA: Diagnosis not present

## 2021-01-14 HISTORY — PX: TRACHEOSTOMY TUBE PLACEMENT: SHX814

## 2021-01-14 LAB — TYPE AND SCREEN
ABO/RH(D): O POS
Antibody Screen: NEGATIVE
Unit division: 0

## 2021-01-14 LAB — CBC
HCT: 25 % — ABNORMAL LOW (ref 39.0–52.0)
Hemoglobin: 7.7 g/dL — ABNORMAL LOW (ref 13.0–17.0)
MCH: 25.8 pg — ABNORMAL LOW (ref 26.0–34.0)
MCHC: 30.8 g/dL (ref 30.0–36.0)
MCV: 83.6 fL (ref 80.0–100.0)
Platelets: 518 10*3/uL — ABNORMAL HIGH (ref 150–400)
RBC: 2.99 MIL/uL — ABNORMAL LOW (ref 4.22–5.81)
RDW: 17.6 % — ABNORMAL HIGH (ref 11.5–15.5)
WBC: 9.7 10*3/uL (ref 4.0–10.5)
nRBC: 0 % (ref 0.0–0.2)

## 2021-01-14 LAB — PROTEIN / CREATININE RATIO, URINE
Creatinine, Urine: 29.86 mg/dL
Protein Creatinine Ratio: 1.88 mg/mg{Cre} — ABNORMAL HIGH (ref 0.00–0.15)
Total Protein, Urine: 56 mg/dL

## 2021-01-14 LAB — URINALYSIS, COMPLETE (UACMP) WITH MICROSCOPIC
Bilirubin Urine: NEGATIVE
Glucose, UA: NEGATIVE mg/dL
Ketones, ur: NEGATIVE mg/dL
Leukocytes,Ua: NEGATIVE
Nitrite: NEGATIVE
Protein, ur: NEGATIVE mg/dL
Specific Gravity, Urine: 1.011 (ref 1.005–1.030)
pH: 5 (ref 5.0–8.0)

## 2021-01-14 LAB — BASIC METABOLIC PANEL
Anion gap: 13 (ref 5–15)
BUN: 131 mg/dL — ABNORMAL HIGH (ref 8–23)
CO2: 16 mmol/L — ABNORMAL LOW (ref 22–32)
Calcium: 8 mg/dL — ABNORMAL LOW (ref 8.9–10.3)
Chloride: 118 mmol/L — ABNORMAL HIGH (ref 98–111)
Creatinine, Ser: 5.04 mg/dL — ABNORMAL HIGH (ref 0.61–1.24)
GFR, Estimated: 12 mL/min — ABNORMAL LOW (ref >60)
Glucose, Bld: 69 mg/dL — ABNORMAL LOW (ref 70–99)
Potassium: 3.1 mmol/L — ABNORMAL LOW (ref 3.5–5.1)
Sodium: 147 mmol/L — ABNORMAL HIGH (ref 135–145)

## 2021-01-14 LAB — GLUCOSE, CAPILLARY
Glucose-Capillary: 145 mg/dL — ABNORMAL HIGH (ref 70–99)
Glucose-Capillary: 149 mg/dL — ABNORMAL HIGH (ref 70–99)
Glucose-Capillary: 64 mg/dL — ABNORMAL LOW (ref 70–99)
Glucose-Capillary: 71 mg/dL (ref 70–99)
Glucose-Capillary: 87 mg/dL (ref 70–99)
Glucose-Capillary: 91 mg/dL (ref 70–99)
Glucose-Capillary: 94 mg/dL (ref 70–99)

## 2021-01-14 LAB — BPAM RBC
Blood Product Expiration Date: 202209282359
ISSUE DATE / TIME: 202208280802
Unit Type and Rh: 5100

## 2021-01-14 LAB — APTT: aPTT: 66 s — ABNORMAL HIGH (ref 24–36)

## 2021-01-14 SURGERY — CREATION, TRACHEOSTOMY
Anesthesia: General | Site: Throat

## 2021-01-14 MED ORDER — NICARDIPINE HCL IN NACL 20-0.86 MG/200ML-% IV SOLN
3.0000 mg/h | INTRAVENOUS | Status: DC
Start: 2021-01-14 — End: 2021-01-14

## 2021-01-14 MED ORDER — 0.9 % SODIUM CHLORIDE (POUR BTL) OPTIME
TOPICAL | Status: DC | PRN
  Administered 2021-01-14: 1000 mL

## 2021-01-14 MED ORDER — ROCURONIUM BROMIDE 100 MG/10ML IV SOLN
INTRAVENOUS | Status: DC | PRN
Start: 2021-01-14 — End: 2021-01-14
  Administered 2021-01-14: 50 mg via INTRAVENOUS

## 2021-01-14 MED ORDER — METOPROLOL TARTRATE 5 MG/5ML IV SOLN
2.5000 mg | Freq: Once | INTRAVENOUS | Status: AC
  Administered 2021-01-14: 2.5 mg via INTRAVENOUS

## 2021-01-14 MED ORDER — ALBUMIN HUMAN 25 % IV SOLN
12.5000 g | Freq: Once | INTRAVENOUS | Status: AC
  Administered 2021-01-14: 12.5 g via INTRAVENOUS
  Filled 2021-01-14: qty 50

## 2021-01-14 MED ORDER — POTASSIUM CHLORIDE 10 MEQ/100ML IV SOLN
INTRAVENOUS | Status: DC | PRN
  Administered 2021-01-14: 10 meq via INTRAVENOUS

## 2021-01-14 MED ORDER — METOPROLOL TARTRATE 5 MG/5ML IV SOLN: 2.5000 mg | INTRAVENOUS | Status: DC | PRN

## 2021-01-14 MED ORDER — POTASSIUM CHLORIDE 10 MEQ/100ML IV SOLN
10.0000 meq | INTRAVENOUS | Status: AC
Start: 2021-01-14 — End: 2021-01-14
  Administered 2021-01-14 (×3): 10 meq via INTRAVENOUS
  Filled 2021-01-14 (×3): qty 100

## 2021-01-14 MED ORDER — LIDOCAINE-EPINEPHRINE 1.5 %-1:200,000 OPTIME - NO CHARGE
INTRAMUSCULAR | Status: DC | PRN
  Administered 2021-01-14: 5 mL via SUBCUTANEOUS

## 2021-01-14 MED ORDER — SODIUM CHLORIDE 0.9 % IV SOLN
0.0200 mg/kg/h | INTRAVENOUS | Status: DC
  Administered 2021-01-20 – 2021-01-25 (×6): 0.02 mg/kg/h via INTRAVENOUS
  Filled 2021-01-14 (×8): qty 250

## 2021-01-14 MED ORDER — HYDRALAZINE HCL 20 MG/ML IJ SOLN
10.0000 mg | Freq: Four times a day (QID) | INTRAMUSCULAR | Status: DC | PRN
  Administered 2021-01-17: 10 mg via INTRAVENOUS
  Filled 2021-01-14 (×2): qty 1

## 2021-01-14 MED ORDER — ALBUMIN HUMAN 25 % IV SOLN: 12.5000 g | Freq: Once | INTRAVENOUS | Status: DC

## 2021-01-14 MED ORDER — PROPOFOL 1000 MG/100ML IV EMUL
0.0000 ug/kg/min | INTRAVENOUS | Status: DC
  Administered 2021-01-14: 40 ug/kg/min via INTRAVENOUS
  Administered 2021-01-14: 35 ug/kg/min via INTRAVENOUS
  Administered 2021-01-15: 25 ug/kg/min via INTRAVENOUS
  Administered 2021-01-15 (×2): 15 ug/kg/min via INTRAVENOUS
  Administered 2021-01-15: 40 ug/kg/min via INTRAVENOUS
  Administered 2021-01-15: 25 ug/kg/min via INTRAVENOUS
  Administered 2021-01-16 (×2): 20 ug/kg/min via INTRAVENOUS
  Filled 2021-01-14 (×7): qty 100

## 2021-01-14 MED ORDER — NICARDIPINE HCL IN NACL 40-0.83 MG/200ML-% IV SOLN
3.0000 mg/h | INTRAVENOUS | Status: DC
  Administered 2021-01-14: 5 mg/h via INTRAVENOUS
  Administered 2021-01-14: 4 mg/h via INTRAVENOUS
  Administered 2021-01-15 – 2021-01-16 (×4): 10 mg/h via INTRAVENOUS
  Administered 2021-01-16 (×2): 14 mg/h via INTRAVENOUS
  Administered 2021-01-16: 8 mg/h via INTRAVENOUS
  Administered 2021-01-17: 5.5 mg/h via INTRAVENOUS
  Administered 2021-01-17: 5 mg/h via INTRAVENOUS
  Administered 2021-01-17: 12 mg/h via INTRAVENOUS
  Administered 2021-01-17: 10 mg/h via INTRAVENOUS
  Administered 2021-01-18: 5 mg/h via INTRAVENOUS
  Administered 2021-01-19: 3 mg/h via INTRAVENOUS
  Administered 2021-01-20: 5 mg/h via INTRAVENOUS
  Administered 2021-01-21: 15 mg/h via INTRAVENOUS
  Administered 2021-01-21: 5.5 mg/h via INTRAVENOUS
  Administered 2021-01-21: 6.5 mg/h via INTRAVENOUS
  Administered 2021-01-21: 15 mg/h via INTRAVENOUS
  Administered 2021-01-21: 10 mg/h via INTRAVENOUS
  Administered 2021-01-21: 15 mg/h via INTRAVENOUS
  Administered 2021-01-22: 5 mg/h via INTRAVENOUS
  Administered 2021-01-22: 7.5 mg/h via INTRAVENOUS
  Filled 2021-01-14 (×27): qty 200

## 2021-01-14 MED ORDER — DEXTROSE 50 % IV SOLN
12.5000 g | Freq: Once | INTRAVENOUS | Status: DC
  Filled 2021-01-14: qty 50

## 2021-01-14 SURGICAL SUPPLY — 38 items
BAG COUNTER SPONGE SURGICOUNT (BAG) ×2 IMPLANT
BAG SPNG CNTER NS LX DISP (BAG) ×1
BAG SURGICOUNT SPONGE COUNTING (BAG) ×1
BLADE CLIPPER SURG (BLADE) IMPLANT
CANISTER SUCT 3000ML PPV (MISCELLANEOUS) ×3 IMPLANT
COVER SURGICAL LIGHT HANDLE (MISCELLANEOUS) ×3 IMPLANT
DRAPE UTILITY XL STRL (DRAPES) ×3 IMPLANT
DRSG TEGADERM 4X4.75 (GAUZE/BANDAGES/DRESSINGS) ×3 IMPLANT
DRSG TELFA 3X8 NADH (GAUZE/BANDAGES/DRESSINGS) ×3 IMPLANT
ELECT CAUTERY BLADE 6.4 (BLADE) ×3 IMPLANT
ELECT REM PT RETURN 9FT ADLT (ELECTROSURGICAL) ×3
ELECTRODE REM PT RTRN 9FT ADLT (ELECTROSURGICAL) ×1 IMPLANT
GAUZE 4X4 16PLY ~~LOC~~+RFID DBL (SPONGE) ×3 IMPLANT
GLOVE SURG ENC MOIS LTX SZ6.5 (GLOVE) ×3 IMPLANT
GLOVE SURG UNDER POLY LF SZ6 (GLOVE) ×3 IMPLANT
GOWN STRL REUS W/ TWL LRG LVL3 (GOWN DISPOSABLE) ×2 IMPLANT
GOWN STRL REUS W/TWL LRG LVL3 (GOWN DISPOSABLE) ×6
INTRODUCER TRACH BLUE RHINO 6F (TUBING) ×3 IMPLANT
INTRODUCER TRACH BLUE RHINO 8F (TUBING) IMPLANT
KIT BASIN OR (CUSTOM PROCEDURE TRAY) ×3 IMPLANT
KIT TURNOVER KIT B (KITS) ×3 IMPLANT
NS IRRIG 1000ML POUR BTL (IV SOLUTION) ×3 IMPLANT
PACK EENT II TURBAN DRAPE (CUSTOM PROCEDURE TRAY) ×3 IMPLANT
PAD ARMBOARD 7.5X6 YLW CONV (MISCELLANEOUS) ×6 IMPLANT
PENCIL BUTTON HOLSTER BLD 10FT (ELECTRODE) ×3 IMPLANT
SPONGE INTESTINAL PEANUT (DISPOSABLE) ×3 IMPLANT
SUT SILK 2 0 SH (SUTURE) IMPLANT
SUT VIC AB 2-0 SH 27 (SUTURE)
SUT VIC AB 2-0 SH 27XBRD (SUTURE) IMPLANT
SUT VICRYL AB 3 0 TIES (SUTURE) ×3 IMPLANT
SYR 20ML LL LF (SYRINGE) ×3 IMPLANT
TOWEL GREEN STERILE (TOWEL DISPOSABLE) ×3 IMPLANT
TOWEL GREEN STERILE FF (TOWEL DISPOSABLE) ×3 IMPLANT
TUBE CONNECTING 12'X1/4 (SUCTIONS) ×1
TUBE CONNECTING 12X1/4 (SUCTIONS) ×2 IMPLANT
TUBE TRACH  6.0 CUFF FLEX (MISCELLANEOUS)
TUBE TRACH 6.0 CUFF FLEX (MISCELLANEOUS) IMPLANT
TUBE TRACH 6.0 EXL PROX CUF (TUBING) ×3 IMPLANT

## 2021-01-14 NOTE — Progress Notes (Signed)
[place] for bivalirudin Indication: atrial fibrillation, DVT, PE 8/23   No Known Allergies  Patient Measurements: Height: 6\' 2"  (188 cm) Weight: (!) 139.1 kg (306 lb 10.6 oz) IBW/kg (Calculated) : 82.2 Heparin Dosing Weight: 107kg  Vital Signs: Temp: 98.1 F (36.7 C) (08/29 1135) Temp Source: Axillary (08/29 1135) BP: 114/57 (08/29 1300) Pulse Rate: 77 (08/29 1622)  Labs: Recent Labs    01/12/21 0552 01/13/21 0500 01/13/21 1245 01/13/21 1755 01/14/21 0500  HGB 7.3* 6.8* 7.9*  --  7.7*  HCT 23.9* 22.4* 26.3*  --  25.0*  PLT 409* 432*  --   --  518*  APTT 58* 66* 69* 63* 66*  CREATININE 4.49* 4.96*  --   --  5.04*     Estimated Creatinine Clearance: 20.3 mL/min (A) (by C-G formula based on SCr of 5.04 mg/dL (H)).   Assessment: 83 YOM presenting s/p fall with TBI/SAH and facial fx, in afib started on amiodarone and now cleared per trauma for full dose anticoagulation. 8/23 patient found to have small acute bilateral PE and age-indeterminate LUE DVT. Pharmacy consulted to dose bivalirudin per Trauma.  Given recent head bleed, will aim for middle of therapeutic range aptt and watch for signs and symptoms of bleeding closely.    Pt s/p trach, bivalirudin held this morning but to be resumed at 2000 tonight per trauma.   Goal of Therapy:  Aptt goal ~50-65s per discussion with Trauma ([place]) Monitor platelets by anticoagulation protocol: Yes   Plan:  Restart bivalirudin 0.032 mg/kg/h  Check aPTT in 4h   Arrie Senate, PharmD, [place], [facility] Clinical Pharmacist [phone] Please check AMION for all [place] numbers 01/14/2021

## 2021-01-14 NOTE — Transfer of Care (Signed)
Immediate Anesthesia Transfer of Care Note  Patient: Angel [name]  Procedure(s) Performed: TRACHEOSTOMY (Throat)  Patient Location: ICU  Anesthesia Type:General  Level of Consciousness: unresponsive  Airway & Oxygen Therapy: Patient connected to tracheostomy mask oxygen  Post-op Assessment: Report given to RN and Post -op Vital signs reviewed and stable  Post vital signs: Reviewed and stable  Last Vitals:  Vitals Value Taken Time  BP 152/69 01/14/21 1606  Temp    Pulse 78 01/14/21 1609  Resp 20 01/14/21 1609  SpO2 94 % 01/14/21 1609  Vitals shown include unvalidated device data.  Last Pain:  Vitals:   01/14/21 1135  TempSrc: Axillary  PainSc:          Complications: No notable events documented.

## 2021-01-14 NOTE — Anesthesia Preprocedure Evaluation (Addendum)
Anesthesia Evaluation  Patient identified by MRN, date of birth, ID band Patient unresponsive    Reviewed: Allergy & Precautions, NPO status , Patient's Chart, lab work & pertinent test results, Unable to perform ROS - Chart review only  Airway Mallampati: Intubated  TM Distance: >3 FB Neck ROM: Full    Dental  (+) Teeth Intact, Dental Advisory Given   Pulmonary  Intubated   Pulmonary exam normal breath sounds clear to auscultation   + intubated    Cardiovascular hypertension,  Rhythm:Regular Rate:Bradycardia     Neuro/Psych SAH    GI/Hepatic Neg liver ROS, GERD  Medicated,  Endo/Other  diabetes, Type 2, Oral Hypoglycemic AgentsMorbid obesity  Renal/GU negative Renal ROS     Musculoskeletal negative musculoskeletal ROS (+)   Abdominal   Peds  Hematology  (+) Blood dyscrasia, anemia ,   Anesthesia Other Findings Day of surgery medications reviewed with the patient.  Reproductive/Obstetrics                             Anesthesia Physical Anesthesia Plan  ASA: 4  Anesthesia Plan: General   Post-op Pain Management:    Induction: Intravenous  PONV Risk Score and Plan: 2 and Propofol infusion and Treatment may vary due to age or medical condition  Airway Management Planned: Oral ETT and Tracheostomy  Additional Equipment:   Intra-op Plan:   Post-operative Plan: Post-operative intubation/ventilation  Informed Consent: I have reviewed the patients History and Physical, chart, labs and discussed the procedure including the risks, benefits and alternatives for the proposed anesthesia with the patient or authorized representative who has indicated his/her understanding and acceptance.     Dental advisory given  Plan Discussed with: CRNA  Anesthesia Plan Comments: (Spoke with patient's wife at bedside regarding anesthetic plan.)       Anesthesia Quick Evaluation

## 2021-01-14 NOTE — Progress Notes (Signed)
Patient ID: Angel [name], male   DOB: 03-21-1951, 70 y.o.   MRN: [id number] S: Has had marked urine output overnight after starting IV lasix. O:BP (!) 152/68   Pulse (!) 50   Temp 97.9 F (36.6 C) (Axillary)   Resp 20   Ht 6' 2"  (1.88 m)   Wt (!) 139.1 kg   SpO2 97%   BMI 39.37 kg/m   Intake/Output Summary (Last 24 hours) at 01/14/2021 1121 Last data filed at 01/14/2021 0700 Gross per 24 hour  Intake 1496.45 ml  Output 3805 ml  Net -2308.55 ml   Intake/Output: I/O last 3 completed shifts: In: 4374.6 [I.V.:3264.7; NG/GT:210; IV Piggyback:899.9] Out: 3662 [Urine:3915; Stool:200]  Intake/Output this shift:  No intake/output data recorded. Weight change: 0.5 kg HUT:MLYYTKPTW and sedated CVS: bradycardic Resp: occ rhonchi Abd: distended, +BS, soft Ext: 2+ bilateral lower extremity edema  Recent Labs  Lab 01/09/21 0957 01/10/21 0238 01/10/21 1346 01/11/21 0603 01/12/21 0552 01/13/21 0500 01/14/21 0500  NA 143 144 141 146* 147* 147* 147*  K 3.9 4.4 3.5 3.2* 3.6 3.6 3.1*  CL 113* 114* 111 115* 118* 120* 118*  CO2 23 19* 20* 19* 18* 16* 16*  GLUCOSE 223* 207* 317* 119* 71 106* 69*  BUN 71* 89* 94* 112* 124* 134* 131*  CREATININE 1.85* 2.08* 2.77* 3.43* 4.49* 4.96* 5.04*  ALBUMIN  --   --  1.7*  --   --   --   --   CALCIUM 7.8* 8.0* 7.9* 8.2* 8.0* 7.8* 8.0*  PHOS 4.1 4.2  --  5.4* 6.6*  --   --   AST  --   --  30  --   --   --   --   ALT  --   --  34  --   --   --   --    Liver Function Tests: Recent Labs  Lab 01/10/21 1346  AST 30  ALT 34  ALKPHOS 53  BILITOT 0.4  PROT 5.7*  ALBUMIN 1.7*   No results for input(s): LIPASE, AMYLASE in the last 168 hours. No results for input(s): AMMONIA in the last 168 hours. CBC: Recent Labs  Lab 01/10/21 0558 01/11/21 0603 01/12/21 0552 01/13/21 0500 01/13/21 1245 01/14/21 0500  WBC 13.8* 14.6* 9.0 7.5  --  9.7  HGB 7.9* 8.0* 7.3* 6.8* 7.9* 7.7*  HCT 26.6* 26.1* 23.9* 22.4* 26.3* 25.0*  MCV 84.7 82.3 83.9 85.2  --   83.6  PLT 290 396 409* 432*  --  518*   Cardiac Enzymes: No results for input(s): CKTOTAL, CKMB, CKMBINDEX, TROPONINI in the last 168 hours. CBG: Recent Labs  Lab 01/13/21 2032 01/13/21 2333 01/14/21 0347 01/14/21 0815 01/14/21 0911  GLUCAP 87 71 71 64* 94    Iron Studies: No results for input(s): IRON, TIBC, TRANSFERRIN, FERRITIN in the last 72 hours. Studies/Results: US RENAL  Result Date: 01/13/2021 CLINICAL DATA:  Acute renal insufficiency EXAM: RENAL / URINARY TRACT ULTRASOUND COMPLETE COMPARISON:  None. FINDINGS: Right Kidney: Renal measurements: 14.9 x 6.6 x 6.3 cm = volume: 324 mL. Contains a 4.8 cm simple cyst. Left Kidney: Renal measurements: 16.2 x 7.4 x 6.9 cm = volume: 433 mL. Contains a 3.9 cm simple cyst. Bladder: Appears normal for degree of bladder distention. Other: None. IMPRESSION: A 4.8 cm simple cyst is seen in the right kidney and a 3.9 cm simple cyst is seen in the left kidney. Neither cyst requires follow-up. No other abnormalities. Electronically Signed  By: Dorise Bullion III M.D.   On: 01/13/2021 16:22   DG CHEST PORT 1 VIEW  Result Date: 01/14/2021 CLINICAL DATA:  Respiratory failure, ventilatory support EXAM: PORTABLE CHEST 1 VIEW COMPARISON:  01/11/2021 FINDINGS: Stable cardiomegaly and vascular congestion. Improving basilar atelectasis. Posterior layering right effusion noted. No pneumothorax. Endotracheal tube 6 cm above the carina. NG tube enters the stomach with the tip not visualized. Degenerative changes of the spine. IMPRESSION: Stable cardiomegaly and vascular congestion. Improving basilar atelectasis Small posterior layering right effusion Electronically Signed   By: Jerilynn Mages.  Shick M.D.   On: 01/14/2021 08:06    acetaminophen  1,000 mg Per Tube Q6H   bethanechol  25 mg Per Tube TID   chlorhexidine gluconate (MEDLINE KIT)  15 mL Mouth Rinse BID   Chlorhexidine Gluconate Cloth  6 each Topical Q0600   dextrose  12.5 g Intravenous Once   docusate  100 mg  Per Tube BID   fentaNYL (SUBLIMAZE) injection  25 mcg Intravenous Once   free water  200 mL Per Tube Q8H   furosemide  80 mg Intravenous Q8H   guaiFENesin  10 mL Per Tube Q4H   insulin aspart  0-20 Units Subcutaneous Q4H   insulin aspart  10 Units Subcutaneous Q4H   insulin glargine-yfgn  43 Units Subcutaneous BID   mouth rinse  15 mL Mouth Rinse 10 times per day   methocarbamol  1,000 mg Per Tube Q8H   pantoprazole sodium  40 mg Per Tube Daily   polyethylene glycol  17 g Per Tube Daily   senna  1 tablet Per Tube Daily   sodium chloride flush  10-40 mL Intracatheter Q12H    BMET    Component Value Date/Time   NA 147 (H) 01/14/2021 0500   K 3.1 (L) 01/14/2021 0500   CL 118 (H) 01/14/2021 0500   CO2 16 (L) 01/14/2021 0500   GLUCOSE 69 (L) 01/14/2021 0500   BUN 131 (H) 01/14/2021 0500   CREATININE 5.04 (H) 01/14/2021 0500   CALCIUM 8.0 (L) 01/14/2021 0500   GFRNONAA 12 (L) 01/14/2021 0500   CBC    Component Value Date/Time   WBC 9.7 01/14/2021 0500   RBC 2.99 (L) 01/14/2021 0500   HGB 7.7 (L) 01/14/2021 0500   HCT 25.0 (L) 01/14/2021 0500   PLT 518 (H) 01/14/2021 0500   MCV 83.6 01/14/2021 0500   MCH 25.8 (L) 01/14/2021 0500   MCHC 30.8 01/14/2021 0500   RDW 17.6 (H) 01/14/2021 0500    Assessment/ Plan: AKI - Multifactorial including ischemic ATN in setting of hypotensive episode/ sepsis earlier in hosp stay complicated by contrast injury.  BP's stable now.  Nonoliguric, patient making urine.  UNa 26, had some proteinuria on UA earlier in hospitalization and albumin of 1.7.  Will recheck UA and order 24 hour urine for protein.   Marked UOP with IV lasix and rising BUN but Scr appears to have reached a plateau.  Will give IV albumin to help with intravascular volume depletion and extravascular edema. May require RRT if BUN/Cr continue to climb. Will reassess on a daily basis any need for RRT, have d/w Gen Surgery. Will follow.  Fall/ bilat SAH - no surgery required, fall  was on 12/28/20 Resp failure - vent support since admission Resp infection - grew strep/ pseudomonas from resp secretions and was rx'd w/ IV abx from 8/17 to current (only 1 dose IV vanc given on 8/17 then dc'd).  Atrial fib / RVR -  in sinus rhythm now Anasarca - as above.  He does have low albumin and is responding to IV lasix. Will check 24 hour urine for protein as well as SPEP/UPEP.  IV albumin to help with intravascular volume depletion/3rd spacing Hypokalemia - replete with 3 runs of KCl 10 mEq (npo for now)  Donetta Potts, MD [facility] [place] [phone]

## 2021-01-14 NOTE — Progress Notes (Addendum)
Pt persistently hypertensive (SBP >200 per A line and cuff) and having difficulty maintaining normal saturation level, requiring increased Fi02 as high as 100% upon return from OR. As he became more awake post-op, these problems progressed.   MD notified, order for Hydralazine and Cardene given and administered in that order. Despite rapid titration of Cardene to 15 mg/hr, pt remains hypertensive in the 996Q, systolic.   Pt dyssynchronous with the vent, visibly uncomfortable. HR now in Afib with RVR (rate 130s sustained), RR mid 30s, and sats 89%. MD notified, order for Propofol and to increase Fentanyl ceiling to 250 mcg/hr.   BP now trending down and within goal range. HR remains elevated in 130s. Sats improved to 93%, RR 29.

## 2021-01-14 NOTE — Op Note (Signed)
   Operative Note  Date: 01/14/2021  Procedure: percutaneous tracheostomy without bronchoscopic assistance  Pre-op diagnosis: prolonged mechanical ventilation Post-op diagnosis: same  Indication and clinical history: The patient  is a 70 y.o. year old male with prolonged mechanical ventilation  Surgeon: Jesusita Oka, MD Assistant: [name], MD  Anesthesia: General   Findings:  Specimen: none EBL: <5cc  Drains/Implants: #6 Shiley cuffed proximal XLT tracheostomy tube  Disposition: ICU  Description of procedure: The patient was positioned supine with a shoulder roll. Time-out was performed verifying correct patient, procedure, signature of informed consent, and pre-operative antibiotics as indicated. MAC induction was uneventful and the patient was confirmed to be on 100% FiO2. The neck was prepped and draped in the usual sterile fashion. Palpation of neck anatomy was performed. A longitudinal incision was made in the neck and deepened down to the trachea.   The pilot balloon was deflated and the endotracheal tube slowly retracted proximally until the tip of the endotracheal tube was palpated just below the cricoid cartilage. An introducer needle was inserted between the second and third tracheal rings. A guidewire was passed through the introducer needle and the needle removed. Serial dilation was performed and a #6 proximal XLT cuffed Shiley and stylet were inserted over the guidewire and the guidewire and stylet removed. The inner cannula was inserted, the pilot balloon on the tracheostomy inflated, and the ventilator tubing disconnected from the endotracheal tube and connected to the tracheostomy. Chest rise, appropriate end-tidal CO2, and return tidal volumes were confirmed. The tracheostomy tube was sutured in four quadrants and a tracheostomy tie applied to the neck. The endotracheal tube was removed. The patient tolerated the procedure well. There were no complications.  Post-procedure chest x-ray was ordered to confirm tube position and the absence of a pneumothorax.   Jesusita Oka, MD General and [facility]

## 2021-01-14 NOTE — Progress Notes (Signed)
I was called that patient has been hypertensive with tachypnea and increasing oxygen requirement this evening. He went to the OR for a trach earlier today. Propofol stopped postoperatively and patient has been increasingly hypertensive and visibly uncomfortable since then. SBP 190-200 on max cardene gtt. CXR reviewed and shows trach tip appropriately positioned within the trachea, no pneumothorax. On exam the patient has equal breath sounds uncomfortably. Propofol restarted with significant improvement in blood pressure. O2 sats improved somewhat. HR remains in 120s-130s, a-fib with RVR. Lose-dose metoprolol 2.5mg  given with improvement in tachycardia. Will minimize beta blockers given prior bradycardia.

## 2021-01-14 NOTE — Progress Notes (Addendum)
Hypoglycemic Event  CBG: 64  Treatment: D50 25 mL (12.5 gm)  Symptoms: None  Follow-up CBG: Time:0900 CBG Result: 94  Possible Reasons for Event: Inadequate meal intake  Comments/MD notified:MD notified upon rounds    Angel [name]

## 2021-01-14 NOTE — Progress Notes (Signed)
0013: HR 102  0014: HR 49   Pt HR dropped significantly and sustained in the upper 30s to low 40s. Cardiology paged and at bedside. Per Cardiology pt converted himself out of afib and back to SB. Pt HR is fine as long as pt remains normotensive. Pt remains SB. Will continue to monitor

## 2021-01-14 NOTE — Progress Notes (Signed)
Trauma/Critical Care Follow Up Note  Subjective:    Overnight Issues:   Objective:  Vital signs for last 24 hours: Temp:  [97.5 F (36.4 C)-98.1 F (36.7 C)] 98.1 F (36.7 C) (08/29 1135) Pulse Rate:  [37-102] 38 (08/29 1300) Resp:  [18-20] 20 (08/29 1300) BP: (101-161)/(54-88) 114/57 (08/29 1300) SpO2:  [93 %-99 %] 97 % (08/29 1300) Arterial Line BP: (111-192)/(48-81) 115/51 (08/29 1300) FiO2 (%):  [40 %] 40 % (08/29 1115) Weight:  [139.1 kg] 139.1 kg (08/28 1400)  Hemodynamic parameters for last 24 hours:    Intake/Output from previous day: 08/28 0701 - 08/29 0700 In: 2027.9 [I.V.:1297.9; NG/GT:30; IV Piggyback:699.9] Out: 3805 [Urine:3605; Stool:200]  Intake/Output this shift: Total I/O In: 665.3 [I.V.:147.8; NG/GT:427; IV Piggyback:90.5] Out: 1500 [Urine:1500]  Vent settings for last 24 hours: Vent Mode: PRVC FiO2 (%):  [40 %] 40 % Set Rate:  [20 bmp] 20 bmp Vt Set:  [650 mL] 650 mL PEEP:  [5 cmH20] 5 cmH20 Plateau Pressure:  [23 cmH20-30 cmH20] 25 cmH20  Physical Exam:  Gen: comfortable, no distress Neuro: non-focal exam, f/c HEENT: PERRL Neck: supple CV: RRR Pulm: unlabored breathing Abd: soft, NT GU: clear yellow urine Extr: wwp, no edema   Results for orders placed or performed during the hospital encounter of 12/28/20 (from the past 24 hour(s))  Creatinine, urine, random     Status: None   Collection Time: 01/13/21  2:24 PM  Result Value Ref Range   Creatinine, Urine 97.12 mg/dL  Sodium, urine, random     Status: None   Collection Time: 01/13/21  2:24 PM  Result Value Ref Range   Sodium, Ur 26 mmol/L  Glucose, capillary     Status: None   Collection Time: 01/13/21  3:33 PM  Result Value Ref Range   Glucose-Capillary 70 70 - 99 mg/dL  APTT     Status: Abnormal   Collection Time: 01/13/21  5:55 PM  Result Value Ref Range   aPTT 63 (H) 24 - 36 seconds  Glucose, capillary     Status: Abnormal   Collection Time: 01/13/21  7:33 PM  Result  Value Ref Range   Glucose-Capillary 53 (L) 70 - 99 mg/dL  Glucose, capillary     Status: None   Collection Time: 01/13/21  8:32 PM  Result Value Ref Range   Glucose-Capillary 87 70 - 99 mg/dL  Glucose, capillary     Status: None   Collection Time: 01/13/21 11:33 PM  Result Value Ref Range   Glucose-Capillary 71 70 - 99 mg/dL  Glucose, capillary     Status: None   Collection Time: 01/14/21  3:47 AM  Result Value Ref Range   Glucose-Capillary 71 70 - 99 mg/dL  CBC     Status: Abnormal   Collection Time: 01/14/21  5:00 AM  Result Value Ref Range   WBC 9.7 4.0 - 10.5 K/uL   RBC 2.99 (L) 4.22 - 5.81 MIL/uL   Hemoglobin 7.7 (L) 13.0 - 17.0 g/dL   HCT 25.0 (L) 39.0 - 52.0 %   MCV 83.6 80.0 - 100.0 fL   MCH 25.8 (L) 26.0 - 34.0 pg   MCHC 30.8 30.0 - 36.0 g/dL   RDW 17.6 (H) 11.5 - 15.5 %   Platelets 518 (H) 150 - 400 K/uL   nRBC 0.0 0.0 - 0.2 %  Basic metabolic panel     Status: Abnormal   Collection Time: 01/14/21  5:00 AM  Result Value Ref Range  Sodium 147 (H) 135 - 145 mmol/L   Potassium 3.1 (L) 3.5 - 5.1 mmol/L   Chloride 118 (H) 98 - 111 mmol/L   CO2 16 (L) 22 - 32 mmol/L   Glucose, Bld 69 (L) 70 - 99 mg/dL   BUN 131 (H) 8 - 23 mg/dL   Creatinine, Ser 5.04 (H) 0.61 - 1.24 mg/dL   Calcium 8.0 (L) 8.9 - 10.3 mg/dL   GFR, Estimated 12 (L) >60 mL/min   Anion gap 13 5 - 15  APTT     Status: Abnormal   Collection Time: 01/14/21  5:00 AM  Result Value Ref Range   aPTT 66 (H) 24 - 36 seconds  Glucose, capillary     Status: Abnormal   Collection Time: 01/14/21  8:15 AM  Result Value Ref Range   Glucose-Capillary 64 (L) 70 - 99 mg/dL  Glucose, capillary     Status: None   Collection Time: 01/14/21  9:11 AM  Result Value Ref Range   Glucose-Capillary 94 70 - 99 mg/dL  Protein / creatinine ratio, urine     Status: Abnormal   Collection Time: 01/14/21 10:52 AM  Result Value Ref Range   Creatinine, Urine 29.86 mg/dL   Total Protein, Urine 56 mg/dL   Protein Creatinine Ratio  1.88 (H) 0.00 - 0.15 mg/mg[Cre]  Urinalysis, Complete w Microscopic     Status: Abnormal   Collection Time: 01/14/21 10:52 AM  Result Value Ref Range   Color, Urine YELLOW YELLOW   APPearance HAZY (A) CLEAR   Specific Gravity, Urine 1.011 1.005 - 1.030   pH 5.0 5.0 - 8.0   Glucose, UA NEGATIVE NEGATIVE mg/dL   Hgb urine dipstick MODERATE (A) NEGATIVE   Bilirubin Urine NEGATIVE NEGATIVE   Ketones, ur NEGATIVE NEGATIVE mg/dL   Protein, ur NEGATIVE NEGATIVE mg/dL   Nitrite NEGATIVE NEGATIVE   Leukocytes,Ua NEGATIVE NEGATIVE   RBC / HPF 0-5 0 - 5 RBC/hpf   WBC, UA 0-5 0 - 5 WBC/hpf   Bacteria, UA RARE (A) NONE SEEN   Squamous Epithelial / LPF 0-5 0 - 5   Mucus PRESENT    Hyaline Casts, UA PRESENT   Glucose, capillary     Status: None   Collection Time: 01/14/21 11:33 AM  Result Value Ref Range   Glucose-Capillary 87 70 - 99 mg/dL    Assessment & Plan: The plan of care was discussed with the bedside nurse for the day, who is in agreement with this plan and no additional concerns were raised.   Present on Admission: **None**    LOS: 17 days   Additional comments:I reviewed the patient's new clinical lab test results.   and I reviewed the patients new imaging test results.    Fall down stairs 8/12   VDRF - guaifenisen, wean, trial of extubation ID - resp CX now with pseud and enterococcus - maxipime/ampicillin. CT A/P with ascending colitis and distention. Stool studies and C. dif are all negative TBI/SAH/SDH - NSGY c/s, Dr. Annette Stable, starting to F/C. Significant frontal lobe injuries. Keppra x7d for sz ppx. Follows commands this AM. Occipital bone fx - NSGY c/s, Dr. Annette Stable Temporal bone fx extending into middle ear - ENT c/s, Dr. Constance Holster Right TM Rupture - ENT c/s, Dr. Constance Holster AFRVR - amio to PO, appreciate Cardiology F/U ABL anemia - 1u PRBC now Bilateral pulmonary embolism - bival AKI - CRT up to 4.96. VF, I consulted Renal Hx DM2 - resistant SSI, glargine 43u BID (171u/24h)  Hx  HTN - PRN meds FEN - NPO, resumed TF yest, will hold at [facility] for OR VTE - SCDs, bival gtt Foley - removed Dispo - ICU, MS much better, trach today  Critical Care Total Time: 35 minutes  Jesusita Oka, MD Trauma & General Surgery Please use [email or web address] to contact on call provider  01/14/2021  *Care during the described time interval was provided by me. I have reviewed this patient's available data, including medical history, events of note, physical examination and test results as part of my evaluation.

## 2021-01-14 NOTE — Progress Notes (Signed)
Inpatient Diabetes Program Recommendations  AACE/ADA: New Consensus Statement on Inpatient Glycemic Control (2015)  Target Ranges:  Prepandial:   less than 140 mg/dL      Peak postprandial:   less than 180 mg/dL (1-2 hours)      Critically ill patients:  140 - 180 mg/dL   Lab Results  Component Value Date   GLUCAP 87 01/14/2021    Review of Glycemic Control Results for ANGELINA, NEECE (MRN [id number]) as of 01/14/2021 13:11  Ref. Range 01/13/2021 23:33 01/14/2021 03:47 01/14/2021 08:15 01/14/2021 09:11 01/14/2021 11:33  Glucose-Capillary Latest Ref Range: 70 - 99 mg/dL 71 71 64 (L) 94 87   Diabetes history: DM  Outpatient Diabetes medications: Glucotrol XL 2.5 mg with breakfast, Tradjenta 5 mg daily, Metformin 1000 mg bid, Actos 45 mg daily Current orders for Inpatient glycemic control:  Novolog resistant q 4 hours, Novolog 10 units q 4 hours, Semglee 43 units bid  Inpatient Diabetes Program Recommendations:    Patient has not received any Semglee or Novolog tube feed coverage since 01/11/21. Per RN, patient is NPO today for Trach/Peg. Recommend d/c of Novolog tube feed coverage and Semglee for now. If Blood sugars increase, restart at lower doses.   Thanks,  Adah Perl, RN, BC-ADM Inpatient Diabetes Coordinator Pager [phone]  (8a-5p)

## 2021-01-14 NOTE — Progress Notes (Signed)
Cardiology Progress Note  Patient ID: Angel [name] MRN: [id number] DOB: October 25, 1950 Date of Encounter: 01/14/2021  Primary Cardiologist: Werner Lean, MD  Subjective   Chief Complaint: None.  HPI: Intubated.  Moving extremities but not following commands.  Severely uremic and acidotic.  Telemetry shows sinus bradycardia with PACs.  ROS:  All other ROS reviewed and negative. Pertinent positives noted in the HPI.     Inpatient Medications  Scheduled Meds:  acetaminophen  1,000 mg Per Tube Q6H   bethanechol  25 mg Per Tube TID   chlorhexidine gluconate (MEDLINE KIT)  15 mL Mouth Rinse BID   Chlorhexidine Gluconate Cloth  6 each Topical Q0600   dextrose  12.5 g Intravenous Once   docusate  100 mg Per Tube BID   fentaNYL (SUBLIMAZE) injection  25 mcg Intravenous Once   free water  200 mL Per Tube Q8H   furosemide  80 mg Intravenous Q8H   guaiFENesin  10 mL Per Tube Q4H   insulin aspart  0-20 Units Subcutaneous Q4H   insulin aspart  10 Units Subcutaneous Q4H   insulin glargine-yfgn  43 Units Subcutaneous BID   mouth rinse  15 mL Mouth Rinse 10 times per day   methocarbamol  1,000 mg Per Tube Q8H   pantoprazole sodium  40 mg Per Tube Daily   polyethylene glycol  17 g Per Tube Daily   senna  1 tablet Per Tube Daily   sodium chloride flush  10-40 mL Intracatheter Q12H   Continuous Infusions:  sodium chloride     ampicillin (OMNIPEN) IV Stopped (01/14/21 0531)   ceFEPime (MAXIPIME) IV Stopped (01/14/21 0104)   dexmedetomidine (PRECEDEX) IV infusion Stopped (01/12/21 6213)   feeding supplement (PIVOT 1.5 CAL) 15 mL/hr at 01/13/21 0800   fentaNYL infusion INTRAVENOUS 150 mcg/hr (01/14/21 0700)   norepinephrine (LEVOPHED) Adult infusion Stopped (01/11/21 0900)   propofol (DIPRIVAN) infusion 15 mcg/kg/min (01/14/21 0700)   PRN Meds: Place/Maintain arterial line **AND** sodium chloride, fentaNYL, fentaNYL (SUBLIMAZE) injection, labetalol, ondansetron **OR** ondansetron  (ZOFRAN) IV, oxyCODONE, sodium chloride flush, sodium phosphate   Vital Signs   Vitals:   01/14/21 0700 01/14/21 0800 01/14/21 0812 01/14/21 0903  BP: (!) 120/57 (!) 150/60  (!) 149/59  Pulse: (!) 48 (!) 53 (!) 47 (!) 55  Resp: 20 20 20 20   Temp:   97.9 F (36.6 C)   TempSrc:   Axillary   SpO2: 97% 96% 96% 97%  Weight:      Height:        Intake/Output Summary (Last 24 hours) at 01/14/2021 1048 Last data filed at 01/14/2021 0700 Gross per 24 hour  Intake 1551.67 ml  Output 3805 ml  Net -2253.33 ml   Last 3 Weights 01/13/2021 01/13/2021 01/09/2021  Weight (lbs) 306 lb 10.6 oz 305 lb 8.9 oz 288 lb 12.8 oz  Weight (kg) 139.1 kg 138.6 kg 131 kg      Telemetry  Overnight telemetry shows sinus bradycardia 30-40 bpm, PACs, A. fib noted yesterday, which I personally reviewed.   ECG  The most recent ECG shows sinus bradycardia heart rate 41, PACs noted, which I personally reviewed.   Physical Exam   Vitals:   01/14/21 0700 01/14/21 0800 01/14/21 0812 01/14/21 0903  BP: (!) 120/57 (!) 150/60  (!) 149/59  Pulse: (!) 48 (!) 53 (!) 47 (!) 55  Resp: 20 20 20 20   Temp:   97.9 F (36.6 C)   TempSrc:   Axillary   SpO2:  97% 96% 96% 97%  Weight:      Height:        Intake/Output Summary (Last 24 hours) at 01/14/2021 1048 Last data filed at 01/14/2021 0700 Gross per 24 hour  Intake 1551.67 ml  Output 3805 ml  Net -2253.33 ml    Last 3 Weights 01/13/2021 01/13/2021 01/09/2021  Weight (lbs) 306 lb 10.6 oz 305 lb 8.9 oz 288 lb 12.8 oz  Weight (kg) 139.1 kg 138.6 kg 131 kg    Body mass index is 39.37 kg/m.   General: Ill-appearing, intubated and sedated on the vent Head: Atraumatic, normal size  Eyes: PEERLA, EOMI  Neck: Supple, no JVD Endocrine: No thryomegaly Cardiac: Normal S1, S2; irregular rhythm, no murmurs Lungs: Diminished breath sounds bilaterally Abd: Soft, nontender, no hepatomegaly  Ext: 2+ pitting edema up to the thighs Musculoskeletal: No deformities Skin: Warm and  dry, no rashes   Neuro: Awake, moving extremities, not following commands Labs  High Sensitivity Troponin:   Recent Labs  Lab 01/10/21 1346 01/10/21 1518  TROPONINIHS 52* 56*     Cardiac EnzymesNo results for input(s): TROPONINI in the last 168 hours. No results for input(s): TROPIPOC in the last 168 hours.  Chemistry Recent Labs  Lab 01/10/21 1346 01/11/21 0603 01/12/21 0552 01/13/21 0500 01/14/21 0500  NA 141   < > 147* 147* 147*  K 3.5   < > 3.6 3.6 3.1*  CL 111   < > 118* 120* 118*  CO2 20*   < > 18* 16* 16*  GLUCOSE 317*   < > 71 106* 69*  BUN 94*   < > 124* 134* 131*  CREATININE 2.77*   < > 4.49* 4.96* 5.04*  CALCIUM 7.9*   < > 8.0* 7.8* 8.0*  PROT 5.7*  --   --   --   --   ALBUMIN 1.7*  --   --   --   --   AST 30  --   --   --   --   ALT 34  --   --   --   --   ALKPHOS 53  --   --   --   --   BILITOT 0.4  --   --   --   --   GFRNONAA 24*   < > 13* 12* 12*  ANIONGAP 10   < > 11 11 13    < > = values in this interval not displayed.    Hematology Recent Labs  Lab 01/12/21 0552 01/13/21 0500 01/13/21 1245 01/14/21 0500  WBC 9.0 7.5  --  9.7  RBC 2.85* 2.63*  --  2.99*  HGB 7.3* 6.8* 7.9* 7.7*  HCT 23.9* 22.4* 26.3* 25.0*  MCV 83.9 85.2  --  83.6  MCH 25.6* 25.9*  --  25.8*  MCHC 30.5 30.4  --  30.8  RDW 17.7* 18.0*  --  17.6*  PLT 409* 432*  --  518*   BNPNo results for input(s): BNP, PROBNP in the last 168 hours.  DDimer No results for input(s): DDIMER in the last 168 hours.   Radiology  US RENAL  Result Date: 01/13/2021 CLINICAL DATA:  Acute renal insufficiency EXAM: RENAL / URINARY TRACT ULTRASOUND COMPLETE COMPARISON:  None. FINDINGS: Right Kidney: Renal measurements: 14.9 x 6.6 x 6.3 cm = volume: 324 mL. Contains a 4.8 cm simple cyst. Left Kidney: Renal measurements: 16.2 x 7.4 x 6.9 cm = volume: 433 mL. Contains a 3.9 cm simple cyst.  Bladder: Appears normal for degree of bladder distention. Other: None. IMPRESSION: A 4.8 cm simple cyst is seen in  the right kidney and a 3.9 cm simple cyst is seen in the left kidney. Neither cyst requires follow-up. No other abnormalities. Electronically Signed   By: Dorise Bullion III M.D.   On: 01/13/2021 16:22   DG CHEST PORT 1 VIEW  Result Date: 01/14/2021 CLINICAL DATA:  Respiratory failure, ventilatory support EXAM: PORTABLE CHEST 1 VIEW COMPARISON:  01/11/2021 FINDINGS: Stable cardiomegaly and vascular congestion. Improving basilar atelectasis. Posterior layering right effusion noted. No pneumothorax. Endotracheal tube 6 cm above the carina. NG tube enters the stomach with the tip not visualized. Degenerative changes of the spine. IMPRESSION: Stable cardiomegaly and vascular congestion. Improving basilar atelectasis Small posterior layering right effusion Electronically Signed   By: Jerilynn Mages.  Shick M.D.   On: 01/14/2021 08:06    Cardiac Studies  TTE 01/10/2021  1. Left ventricular ejection fraction, by estimation, is 60 to 65%. The  left ventricle has normal function. The left ventricle has no regional  wall motion abnormalities. There is mild left ventricular hypertrophy.  Left ventricular diastolic parameters  were normal.   2. Right ventricular systolic function is mildly reduced. The right  ventricular size is not well visualized.   3. The mitral valve is normal in structure. Trivial mitral valve  regurgitation. No evidence of mitral stenosis.   4. The aortic valve is tricuspid. Aortic valve regurgitation is not  visualized. Mild aortic valve sclerosis is present, with no evidence of  aortic valve stenosis.   Patient Profile  NANDAN WILLEMS is a 70 y.o. male with hypertension, diabetes, hyperlipidemia who was admitted on 12/28/2020 with fall and subarachnoid hemorrhage.  Course has been very complicated with multifocal pneumonia/septic shock diagnosis on 01/02/2021.  He did develop atrial fibrillation on 01/04/2021 likely secondary to pneumonia and septic shock..  Course is also been complicated by  bilateral pulmonary emboli developed on 01/09/2021.  Cardiology initially had signed off but they were reconsulted in the setting of bradycardia as well as recurrent sepsis.  Assessment & Plan   Atrial fibrillation with RVR -He developed this on 01/04/2021.  This was in the setting of multifocal pneumonia, septic shock, multiorgan system failure.  He is also had an extended course complicated by acute kidney injury and not yet on hemodialysis. -Initially was placed on amiodarone.  This is now being held in the setting of bradycardia. -He is on bivalirudin for PE.  Should continue this for A. fib anticoagulation as well. -He is currently maintaining sinus rhythm.  I suspect this will all improve with improvement in volume status as well as electrolyte derangement. -Echo shows normal LV function. -Hold amiodarone and AV nodal agents given bradycardia.  See discussion below.  2.  Sinus bradycardia with nonconducted PACs -Suspect this is all secondary to severe acidosis. -He has profound AKI.  Creatinine above 5.  BUN 131.  Nephrology has been consulted.  They have plans for diuresis prior to volume removal.  He is also hyponatremic. -I suspect this will get better once his electrolyte derangements have been resolved.  He is also volume overloaded which is not helping.  I will defer need for hemodialysis to nephrology.  None of this is helping his cardiac condition. -Regardless he is hemodynamically stable.  Hold AV nodal agents.  Hold amiodarone.  3.  Fall subarachnoid hemorrhage -Per trauma team.  On bivalirudin for PE.  4.  Bilateral pulmonary emboli -On bivalirudin.  5.  Multifocal pneumonia -Pseudomonas pneumonia -Antibiotics per primary team.  CRITICAL CARE Performed by: [place] Bells T O'Neal  Total critical care time: 45 minutes. Critical care time was exclusive of separately billable procedures and treating other patients. Critical care was necessary to treat or prevent imminent or  life-threatening deterioration. Critical care was time spent personally by me on the following activities: development of treatment plan with patient and/or surrogate as well as nursing, discussions with consultants, evaluation of patient's response to treatment, examination of patient, obtaining history from patient or surrogate, ordering and performing treatments and interventions, ordering and review of laboratory studies, ordering and review of radiographic studies, pulse oximetry and re-evaluation of patient's condition.  For questions or updates, please contact [facility] Please consult [email or web address] for contact info under   Signed, Lake Bells T. Audie Box, MD, [place]  01/14/2021 10:48 AM

## 2021-01-15 ENCOUNTER — Encounter (HOSPITAL_COMMUNITY): Payer: Self-pay | Admitting: Surgery

## 2021-01-15 ENCOUNTER — Inpatient Hospital Stay (HOSPITAL_COMMUNITY): Payer: PPO

## 2021-01-15 DIAGNOSIS — I4819 Other persistent atrial fibrillation: Secondary | ICD-10-CM | POA: Diagnosis not present

## 2021-01-15 DIAGNOSIS — R001 Bradycardia, unspecified: Secondary | ICD-10-CM | POA: Diagnosis not present

## 2021-01-15 DIAGNOSIS — J9690 Respiratory failure, unspecified, unspecified whether with hypoxia or hypercapnia: Secondary | ICD-10-CM | POA: Diagnosis not present

## 2021-01-15 DIAGNOSIS — J90 Pleural effusion, not elsewhere classified: Secondary | ICD-10-CM | POA: Diagnosis not present

## 2021-01-15 DIAGNOSIS — I480 Paroxysmal atrial fibrillation: Secondary | ICD-10-CM | POA: Diagnosis not present

## 2021-01-15 LAB — GLUCOSE, CAPILLARY
Glucose-Capillary: 122 mg/dL — ABNORMAL HIGH (ref 70–99)
Glucose-Capillary: 151 mg/dL — ABNORMAL HIGH (ref 70–99)
Glucose-Capillary: 155 mg/dL — ABNORMAL HIGH (ref 70–99)
Glucose-Capillary: 200 mg/dL — ABNORMAL HIGH (ref 70–99)
Glucose-Capillary: 260 mg/dL — ABNORMAL HIGH (ref 70–99)
Glucose-Capillary: 99 mg/dL (ref 70–99)

## 2021-01-15 LAB — CBC
HCT: 25.9 % — ABNORMAL LOW (ref 39.0–52.0)
Hemoglobin: 7.9 g/dL — ABNORMAL LOW (ref 13.0–17.0)
MCH: 26.1 pg (ref 26.0–34.0)
MCHC: 30.5 g/dL (ref 30.0–36.0)
MCV: 85.5 fL (ref 80.0–100.0)
Platelets: 572 10*3/uL — ABNORMAL HIGH (ref 150–400)
RBC: 3.03 MIL/uL — ABNORMAL LOW (ref 4.22–5.81)
RDW: 17.9 % — ABNORMAL HIGH (ref 11.5–15.5)
WBC: 10.1 10*3/uL (ref 4.0–10.5)
nRBC: 0.2 % (ref 0.0–0.2)

## 2021-01-15 LAB — RENAL FUNCTION PANEL
Albumin: 1.7 g/dL — ABNORMAL LOW (ref 3.5–5.0)
Anion gap: 14 (ref 5–15)
BUN: 127 mg/dL — ABNORMAL HIGH (ref 8–23)
CO2: 15 mmol/L — ABNORMAL LOW (ref 22–32)
Calcium: 8.2 mg/dL — ABNORMAL LOW (ref 8.9–10.3)
Chloride: 117 mmol/L — ABNORMAL HIGH (ref 98–111)
Creatinine, Ser: 4.9 mg/dL — ABNORMAL HIGH (ref 0.61–1.24)
GFR, Estimated: 12 mL/min — ABNORMAL LOW (ref >60)
Glucose, Bld: 150 mg/dL — ABNORMAL HIGH (ref 70–99)
Phosphorus: 7.9 mg/dL — ABNORMAL HIGH (ref 2.5–4.6)
Potassium: 3.2 mmol/L — ABNORMAL LOW (ref 3.5–5.1)
Sodium: 146 mmol/L — ABNORMAL HIGH (ref 135–145)

## 2021-01-15 LAB — APTT: aPTT: 60 s — ABNORMAL HIGH (ref 24–36)

## 2021-01-15 MED ORDER — FREE WATER
200.0000 mL | Status: DC
  Administered 2021-01-15 – 2021-01-18 (×19): 200 mL

## 2021-01-15 MED ORDER — ALBUMIN HUMAN 25 % IV SOLN
12.5000 g | Freq: Once | INTRAVENOUS | Status: AC
  Administered 2021-01-15: 12.5 g via INTRAVENOUS
  Filled 2021-01-15: qty 50

## 2021-01-15 MED ORDER — POTASSIUM CHLORIDE 10 MEQ/100ML IV SOLN
10.0000 meq | INTRAVENOUS | Status: AC
Start: 2021-01-15 — End: 2021-01-15
  Administered 2021-01-15 (×3): 10 meq via INTRAVENOUS
  Filled 2021-01-15: qty 100

## 2021-01-15 MED ORDER — POTASSIUM CHLORIDE 10 MEQ/100ML IV SOLN
10.0000 meq | INTRAVENOUS | Status: DC
Start: 2021-01-15 — End: 2021-01-15

## 2021-01-15 NOTE — Progress Notes (Signed)
OT Cancellation Note  Patient Details Name: Angel [name] MRN: [id number] DOB: 1951-01-15   Cancelled Treatment:    Reason Eval/Treat Not Completed: Patient not medically ready (on vent sedated with cardene drip. holding at this time for more appropriate time)  Billey Chang, OTR/L  Acute Rehabilitation Services Pager: [phone] Office: [phone] .  01/15/2021, 7:56 AM

## 2021-01-15 NOTE — Progress Notes (Signed)
[place] for bivalirudin Indication: atrial fibrillation, DVT, PE 8/23   No Known Allergies  Patient Measurements: Height: 6\' 2"  (188 cm) Weight: (!) 139.1 kg (306 lb 10.6 oz) IBW/kg (Calculated) : 82.2 Heparin Dosing Weight: 107kg  Vital Signs: Temp: 98.1 F (36.7 C) (08/30 0000) Temp Source: Axillary (08/30 0000) BP: 107/57 (08/30 0135) Pulse Rate: 60 (08/30 0135)  Labs: Recent Labs    01/13/21 0500 01/13/21 1245 01/13/21 1755 01/14/21 0500 01/15/21 0201  HGB 6.8* 7.9*  --  7.7* 7.9*  HCT 22.4* 26.3*  --  25.0* 25.9*  PLT 432*  --   --  518* 572*  APTT 66* 69* 63* 66* 60*  CREATININE 4.96*  --   --  5.04* 4.90*     Estimated Creatinine Clearance: 20.8 mL/min (A) (by C-G formula based on SCr of 4.9 mg/dL (H)).   Assessment: 70 yo male with Afib and PE/DVT for bivalirudin   Goal of Therapy:  aPTT goal ~50-65s per discussion with Trauma ([place]) Monitor platelets by anticoagulation protocol: Yes   Plan:  Continue bivalirudin at current rate  Phillis Knack, PharmD, BCPS

## 2021-01-15 NOTE — Progress Notes (Signed)
PT Cancellation Note  Patient Details Name: Angel [name] MRN: [id number] DOB: November 22, 1950   Cancelled Treatment:    Reason Eval/Treat Not Completed: Medical issues which prohibited therapy - will check back.  Stacie Glaze, PT DPT Acute Rehabilitation Services Pager [phone]  Office [phone]   [name] 01/15/2021, 8:35 AM

## 2021-01-15 NOTE — Progress Notes (Addendum)
Cardiology Progress Note  Patient ID: Angel [name] MRN: [id number] DOB: 1950/06/17 Date of Encounter: 01/15/2021  Primary Cardiologist: Werner Lean, MD  Subjective   Chief Complaint: Intubated and sedated on the vent  HPI: Status post tracheostomy yesterday.  Had bradycardia yesterday.  This was after receiving sedatives.  Now hypertensive.  Has had labile blood pressures.  Urine output picking up.  Creatinine seems to have reached a plateau.  ROS:  All other ROS reviewed and negative. Pertinent positives noted in the HPI.     Inpatient Medications  Scheduled Meds:  acetaminophen  1,000 mg Per Tube Q6H   bethanechol  25 mg Per Tube TID   chlorhexidine gluconate (MEDLINE KIT)  15 mL Mouth Rinse BID   Chlorhexidine Gluconate Cloth  6 each Topical Q0600   dextrose  12.5 g Intravenous Once   docusate  100 mg Per Tube BID   fentaNYL (SUBLIMAZE) injection  25 mcg Intravenous Once   free water  200 mL Per Tube Q4H   furosemide  80 mg Intravenous Q8H   guaiFENesin  10 mL Per Tube Q4H   insulin aspart  0-20 Units Subcutaneous Q4H   insulin aspart  10 Units Subcutaneous Q4H   insulin glargine-yfgn  43 Units Subcutaneous BID   mouth rinse  15 mL Mouth Rinse 10 times per day   methocarbamol  1,000 mg Per Tube Q8H   pantoprazole sodium  40 mg Per Tube Daily   polyethylene glycol  17 g Per Tube Daily   senna  1 tablet Per Tube Daily   sodium chloride flush  10-40 mL Intracatheter Q12H   Continuous Infusions:  sodium chloride     albumin human     ampicillin (OMNIPEN) IV Stopped (01/15/21 0709)   bivalirudin (ANGIOMAX) infusion 0.5 mg/mL (Non-ACS indications) 0.032 mg/kg/hr (01/15/21 1000)   ceFEPime (MAXIPIME) IV Stopped (01/15/21 0127)   feeding supplement (PIVOT 1.5 CAL) 1,000 mL (01/15/21 0823)   fentaNYL infusion INTRAVENOUS 150 mcg/hr (01/15/21 1000)   niCARDipine Stopped (01/15/21 0129)   potassium chloride     propofol (DIPRIVAN) infusion 15 mcg/kg/min (01/15/21  1000)   PRN Meds: Place/Maintain arterial line **AND** sodium chloride, fentaNYL, fentaNYL (SUBLIMAZE) injection, hydrALAZINE, ondansetron **OR** ondansetron (ZOFRAN) IV, oxyCODONE, sodium chloride flush, sodium phosphate   Vital Signs   Vitals:   01/15/21 0800 01/15/21 0827 01/15/21 0900 01/15/21 1000  BP:      Pulse: 68  75 71  Resp: 15  17 17   Temp:  97.8 F (36.6 C)    TempSrc:      SpO2: 98%  93% 95%  Weight:      Height:        Intake/Output Summary (Last 24 hours) at 01/15/2021 1110 Last data filed at 01/15/2021 1000 Gross per 24 hour  Intake 4893.72 ml  Output 5135 ml  Net -241.28 ml   Last 3 Weights 01/13/2021 01/13/2021 01/09/2021  Weight (lbs) 306 lb 10.6 oz 305 lb 8.9 oz 288 lb 12.8 oz  Weight (kg) 139.1 kg 138.6 kg 131 kg      Telemetry  Overnight telemetry shows sinus bradycardia with PACs in the 30-40 beeper minute range, paroxysmal A. fib noted as well, which I personally reviewed.   ECG  The most recent ECG shows sinus bradycardia heart rate 41, PACs, which I personally reviewed.   Physical Exam   Vitals:   01/15/21 0800 01/15/21 0827 01/15/21 0900 01/15/21 1000  BP:      Pulse: 68  75 71  Resp: 15  17 17   Temp:  97.8 F (36.6 C)    TempSrc:      SpO2: 98%  93% 95%  Weight:      Height:        Intake/Output Summary (Last 24 hours) at 01/15/2021 1110 Last data filed at 01/15/2021 1000 Gross per 24 hour  Intake 4893.72 ml  Output 5135 ml  Net -241.28 ml    Last 3 Weights 01/13/2021 01/13/2021 01/09/2021  Weight (lbs) 306 lb 10.6 oz 305 lb 8.9 oz 288 lb 12.8 oz  Weight (kg) 139.1 kg 138.6 kg 131 kg    Body mass index is 39.37 kg/m.   General: Status post tracheostomy Head: Atraumatic, normal size  Eyes: PEERLA, EOMI  Neck: Supple, elevated JVD Endocrine: No thryomegaly Cardiac: Normal S1, S2; RRR; no murmurs, rubs, or gallops Lungs: Diminished breath sounds bilaterally Abd: Soft, nontender, no hepatomegaly  Ext: 2+ pitting edema up to  thighs Musculoskeletal: No deformities Skin: Warm and dry, no rashes   Neuro: Intubated and sedated on the vent  Labs  High Sensitivity Troponin:   Recent Labs  Lab 01/10/21 1346 01/10/21 1518  TROPONINIHS 52* 56*     Cardiac EnzymesNo results for input(s): TROPONINI in the last 168 hours. No results for input(s): TROPIPOC in the last 168 hours.  Chemistry Recent Labs  Lab 01/10/21 1346 01/11/21 0603 01/13/21 0500 01/14/21 0500 01/15/21 0201  NA 141   < > 147* 147* 146*  K 3.5   < > 3.6 3.1* 3.2*  CL 111   < > 120* 118* 117*  CO2 20*   < > 16* 16* 15*  GLUCOSE 317*   < > 106* 69* 150*  BUN 94*   < > 134* 131* 127*  CREATININE 2.77*   < > 4.96* 5.04* 4.90*  CALCIUM 7.9*   < > 7.8* 8.0* 8.2*  PROT 5.7*  --   --   --   --   ALBUMIN 1.7*  --   --   --  1.7*  AST 30  --   --   --   --   ALT 34  --   --   --   --   ALKPHOS 53  --   --   --   --   BILITOT 0.4  --   --   --   --   GFRNONAA 24*   < > 12* 12* 12*  ANIONGAP 10   < > 11 13 14    < > = values in this interval not displayed.    Hematology Recent Labs  Lab 01/13/21 0500 01/13/21 1245 01/14/21 0500 01/15/21 0201  WBC 7.5  --  9.7 10.1  RBC 2.63*  --  2.99* 3.03*  HGB 6.8* 7.9* 7.7* 7.9*  HCT 22.4* 26.3* 25.0* 25.9*  MCV 85.2  --  83.6 85.5  MCH 25.9*  --  25.8* 26.1  MCHC 30.4  --  30.8 30.5  RDW 18.0*  --  17.6* 17.9*  PLT 432*  --  518* 572*   BNPNo results for input(s): BNP, PROBNP in the last 168 hours.  DDimer No results for input(s): DDIMER in the last 168 hours.   Radiology  US RENAL  Result Date: 01/13/2021 CLINICAL DATA:  Acute renal insufficiency EXAM: RENAL / URINARY TRACT ULTRASOUND COMPLETE COMPARISON:  None. FINDINGS: Right Kidney: Renal measurements: 14.9 x 6.6 x 6.3 cm = volume: 324 mL. Contains a 4.8 cm simple  cyst. Left Kidney: Renal measurements: 16.2 x 7.4 x 6.9 cm = volume: 433 mL. Contains a 3.9 cm simple cyst. Bladder: Appears normal for degree of bladder distention. Other: None.  IMPRESSION: A 4.8 cm simple cyst is seen in the right kidney and a 3.9 cm simple cyst is seen in the left kidney. Neither cyst requires follow-up. No other abnormalities. Electronically Signed   By: Dorise Bullion III M.D.   On: 01/13/2021 16:22   DG Chest Port 1 View  Result Date: 01/15/2021 CLINICAL DATA:  Respiratory failure EXAM: PORTABLE CHEST 1 VIEW COMPARISON:  01/14/2021 FINDINGS: Cardiac shadow remains enlarged. Tracheostomy tube and gastric catheter are again seen and stable. Right-sided PICC line is noted in the right atrium stable in appearance. Right-sided pleural effusion is noted similar to that seen on the prior exam. Persistent vascular congestion is noted. No bony abnormality is noted. IMPRESSION: Vascular congestion and right-sided effusion stable from the prior exam. Electronically Signed   By: Inez Catalina M.D.   On: 01/15/2021 08:55   DG Chest Port 1 View  Result Date: 01/14/2021 CLINICAL DATA:  Respiratory failure. EXAM: PORTABLE CHEST 1 VIEW COMPARISON:  Chest radiograph dated 01/14/2021. FINDINGS: Tracheostomy remains above the carina and enteric tube with tip in the proximal stomach. The enteric tube has been pulled back. Right-sided PICC with tip close to the cavoatrial junction. Cardiomegaly with vascular congestion and edema. Small bilateral pleural effusions and bibasilar atelectasis or infiltrate. No pneumothorax. No acute osseous pathology. IMPRESSION: Cardiomegaly with findings of CHF similar or slightly worsened since the prior radiograph. Superimposed pneumonia is not excluded clinical correlation is recommended. Electronically Signed   By: Anner Crete M.D.   On: 01/14/2021 19:11   DG CHEST PORT 1 VIEW  Result Date: 01/14/2021 CLINICAL DATA:  Respiratory failure, ventilatory support EXAM: PORTABLE CHEST 1 VIEW COMPARISON:  01/11/2021 FINDINGS: Stable cardiomegaly and vascular congestion. Improving basilar atelectasis. Posterior layering right effusion noted. No  pneumothorax. Endotracheal tube 6 cm above the carina. NG tube enters the stomach with the tip not visualized. Degenerative changes of the spine. IMPRESSION: Stable cardiomegaly and vascular congestion. Improving basilar atelectasis Small posterior layering right effusion Electronically Signed   By: Jerilynn Mages.  Shick M.D.   On: 01/14/2021 08:06    Cardiac Studies  TTE 01/10/2021  1. Left ventricular ejection fraction, by estimation, is 60 to 65%. The  left ventricle has normal function. The left ventricle has no regional  wall motion abnormalities. There is mild left ventricular hypertrophy.  Left ventricular diastolic parameters  were normal.   2. Right ventricular systolic function is mildly reduced. The right  ventricular size is not well visualized.   3. The mitral valve is normal in structure. Trivial mitral valve  regurgitation. No evidence of mitral stenosis.   4. The aortic valve is tricuspid. Aortic valve regurgitation is not  visualized. Mild aortic valve sclerosis is present, with no evidence of  aortic valve stenosis.   Patient Profile  Angel [name] is a 70 y.o. male with hypertension, diabetes, hyperlipidemia who was admitted on 12/28/2020 with fall and subarachnoid hemorrhage.  Course has been very complicated with multifocal pneumonia/septic shock diagnosis on 01/02/2021.  He did develop atrial fibrillation on 01/04/2021 likely secondary to pneumonia and septic shock..  Course is also been complicated by bilateral pulmonary emboli developed on 01/09/2021.  Cardiology initially had signed off but they were reconsulted in the setting of bradycardia as well as recurrent sepsis.  Assessment & Plan   Paroxysmal  atrial fibrillation with RVR -Developed on 01/04/2021 -Likely driven by critical illness, multiorgan system failure and multifocal pneumonia.  Also has bilateral pulmonary emboli. -Was in A. fib but now in sinus rhythm. -Has had bradycardia which is likely driven by significant metabolic  acidosis.  He also has significant electrolyte derangements in the setting of acute kidney injury. -Echo shows normal LV function -He is on bivalirudin drip for Pes -Suspect his A. fib will improve with improvement in his overall condition. -He has had bradycardia which is driven by critical illness we will hold AV nodal agents moving forward.  Would hold amiodarone as well. -We definitely will continue to monitor him for tachybradycardia syndrome.  However it is very difficult to make this diagnosis with such severe metabolic derangements.  We will continue to follow along for any need for pacing.  At this time I see no need.  2.  Sinus bradycardia with nonconducted PACs -Continues to have episodes of sinus bradycardia.  Secondary to severe acidosis, pneumonia, acute kidney injury -No signs of instability.  No indications for pacing.  See discussion for possible tachybradycardia as above -We will continue to treat his kidney injury and hopefully his bradycardia will improve with his acidosis improvement. -He is also receiving sedative agent such as propofol which will exacerbate his bradycardia.  Would be cautious with these.  -Hold AV nodal agents.  Hold amiodarone.  3.  Fall with subarachnoid hemorrhage -Per trauma team  4.  Bilateral pulm emboli -Bivalirudin per primary team.  5.  Multifocal pneumonia -Pseudomonas pneumonia.  On antibiotics per primary team.  CRITICAL CARE Performed by: [place] Bells T O'Neal  Total critical care time: 35 minutes. Critical care time was exclusive of separately billable procedures and treating other patients. Critical care was necessary to treat or prevent imminent or life-threatening deterioration. Critical care was time spent personally by me on the following activities: development of treatment plan with patient and/or surrogate as well as nursing, discussions with consultants, evaluation of patient's response to treatment, examination of patient, obtaining  history from patient or surrogate, ordering and performing treatments and interventions, ordering and review of laboratory studies, ordering and review of radiographic studies, pulse oximetry and re-evaluation of patient's condition.  For questions or updates, please contact [place] Please consult [email or web address] for contact info under     Signed, Lake Bells T. Audie Box, MD, [place]  01/15/2021 11:10 AM

## 2021-01-15 NOTE — Anesthesia Postprocedure Evaluation (Signed)
Anesthesia Post Note  Patient: Angel [name]  Procedure(s) Performed: TRACHEOSTOMY (Throat)     Patient location during evaluation: SICU Anesthesia Type: General Level of consciousness: sedated Pain management: pain level controlled Vital Signs Assessment: post-procedure vital signs reviewed and stable Respiratory status: patient remains intubated per anesthesia plan (s/p tracheostomy) Cardiovascular status: stable Postop Assessment: no apparent nausea or vomiting Anesthetic complications: no   No notable events documented.  Last Vitals:  Vitals:   01/15/21 0600 01/15/21 0743  BP: (!) 97/45   Pulse: (!) 41 60  Resp: 20 20  Temp:    SpO2: 100% 98%    Last Pain:  Vitals:   01/15/21 0400  TempSrc: Axillary  PainSc:                  Angel [name]

## 2021-01-15 NOTE — Progress Notes (Addendum)
Nutrition Follow-up  DOCUMENTATION CODES:   Obesity unspecified  INTERVENTION:   Tube feeding via NG tube:   Pivot 1.5 at 65 ml/h (1560 ml per day) Provides 2340 kcal, 146 gm protein, 1184 ml free water daily  200 ml free water every 8 hours Total free water: 1784 ml    NUTRITION DIAGNOSIS:   Inadequate oral intake related to inability to eat as evidenced by NPO status. Ongoing.   GOAL:   Patient will meet greater than or equal to 90% of their needs Met with TF.   MONITOR:   TF tolerance  REASON FOR ASSESSMENT:   Consult, Ventilator Enteral/tube feeding initiation and management  ASSESSMENT:   Pt with PMH of DM and HTN admitted after falling down basement stairs with TBI/SAH/SDH, significant frontal lobe injuries, occipital bone fx, temporal bone fx extending into middle ear, and R TM rupture.    Pt discussed during ICU rounds and with RN.  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 C.diff negative.   Trach placed yesterday, TF resumed today   Pt with moderate edema and weight up 9 kg.  Pt on lasix with >4 L UOP.  Renal following for AKI. BUN/Cr possible plateau, adding free water for hypernatremia and free water deficit of 3 L, giving albumin prior to lasix for intravascular volume depletion and extravascular edema. Checking a 24 hour urine for protein as well as SPEP/UPEP. Per renal no RRT for now.    8/15 s/p cortrak placement; tip gastric 8/25 pt vomited, OG placed with 650 ml out, abd tight  8/26 extubated but required re-intubation 2 hours later; Cortrak coiled in pt's mouth post extubation and removed; NG tube placed with tip in distal stomach  8/27 TF resumed 8/29 s/p trach placement    Propofol @ 5 ml/hr provides: 132 kcal  Medications reviewed and include: colace, lasix, SSI, 10 units novolog every 4 hours, 43 units semglee BID, protonix, miralax, senna Fentanyl  Cardene   Labs reviewed: Na: 146, K+3.2, BUN 127, Cr: 4.90, PO4  7.9 CBG's: 99-149   UOP: 4300 ml  I&O: +13 L Moderate edema    Diet Order:   Diet Order             Diet NPO time specified  Diet effective now                   EDUCATION NEEDS:   Not appropriate for education at this time  Skin:  Skin Assessment: Reviewed RN Assessment  Last BM:  100 ml rectal tube  Height:   Ht Readings from Last 1 Encounters:  12/28/20 6' 2"  (1.88 m)    Weight:   Wt Readings from Last 1 Encounters:  01/13/21 (!) 139.1 kg    BMI:  Body mass index is 39.37 kg/m.  Estimated Nutritional Needs:   Kcal:  2200-2600  Protein:  135-150 grams  Fluid:  > 2 L/day  Lockie Pares., RD, LDN, CNSC See AMiON for contact information

## 2021-01-15 NOTE — Progress Notes (Signed)
Patient ID: Angel [name], male   DOB: [birth year], 70 y.o.   MRN: [id number] Follow up - Trauma Critical Care  Patient Details:    Angel [name] is an 70 y.o. male.  Lines/tubes : PICC Triple Lumen 09/38/18 PICC Right Basilic 47 cm 1 cm (Active)  Indication for Insertion or Continuance of Line Vasoactive infusions 01/14/21 2000  Exposed Catheter (cm) 1 cm 01/09/21 0916  Site Assessment Clean;Dry;Intact 01/14/21 2000  Lumen #1 Status Infusing 01/14/21 2000  Lumen #2 Status Infusing 01/14/21 2000  Lumen #3 Status Flushed;Saline locked 01/14/21 2000  Dressing Type Transparent;Securing device 01/14/21 2000  Dressing Status Clean;Dry;Intact 01/14/21 2000  Antimicrobial disc in place? Yes 01/14/21 2000  Safety Lock Not Applicable 29/93/71 6967  Line Care Connections checked and tightened 01/14/21 2000  Dressing Intervention New dressing;Other (Comment) 01/09/21 0916  Dressing Change Due 01/16/21 01/14/21 2000     Arterial Line 01/10/21 Right Radial (Active)  Site Assessment Clean;Dry;Intact 01/14/21 2000  Line Status Pulsatile blood flow 01/14/21 2000  Art Line Waveform Appropriate 01/14/21 2000  Art Line Interventions Zeroed and calibrated;Leveled;Tubing changed;Connections checked and tightened;Flushed per protocol 01/14/21 2000  Color/Movement/Sensation Capillary refill less than 3 sec 01/14/21 2000  Dressing Type Transparent 01/14/21 2000  Dressing Status Clean;Dry;Intact;Antimicrobial disc in place 01/14/21 2000     NG/OG Vented/Dual Lumen Left nare (Active)  Site Assessment Clean;Dry;Intact 01/14/21 2000  Interventions Clamped 01/14/21 2000     External Urinary Catheter (Active)  Collection Container Standard drainage bag 01/14/21 2000  Securement Method Securing device (Describe) 01/14/21 2000  Site Assessment Clean;Intact 01/14/21 2000  Intervention Male External Urinary Catheter Replaced 01/14/21 2000  Output (mL) 1000 mL 01/15/21 0557     Fecal Management System (Active)   Does patient meet criteria for removal? No 01/14/21 2000  Daily care Skin around tube assessed;Skin barrier applied to rectal area;Assess location of position indicator line 01/14/21 2000  Output (mL) 100 mL 01/14/21 1600  Intake (mL) 30 mL 01/06/21 0520    Microbiology/Sepsis markers: Results for orders placed or performed during the hospital encounter of 12/28/20  Resp Panel by RT-PCR (Flu A&B, Covid) Nasopharyngeal Swab     Status: None   Collection Time: 12/28/20  4:17 PM   Specimen: Nasopharyngeal Swab; Nasopharyngeal(NP) swabs in vial transport medium  Result Value Ref Range Status   SARS Coronavirus 2 by RT PCR NEGATIVE NEGATIVE Final    Comment: (NOTE) SARS-CoV-2 target nucleic acids are NOT DETECTED.  The SARS-CoV-2 RNA is generally detectable in upper respiratory specimens during the acute phase of infection. The lowest concentration of SARS-CoV-2 viral copies this assay can detect is 138 copies/mL. A negative result does not preclude SARS-Cov-2 infection and should not be used as the sole basis for treatment or other patient management decisions. A negative result may occur with  improper specimen collection/handling, submission of specimen other than nasopharyngeal swab, presence of viral mutation(s) within the areas targeted by this assay, and inadequate number of viral copies(<138 copies/mL). A negative result must be combined with clinical observations, patient history, and epidemiological information. The expected result is Negative.  Fact Sheet for Patients:  EntrepreneurPulse.com.au  Fact Sheet for Healthcare Providers:  IncredibleEmployment.be  This test is no t yet approved or cleared by the [place] FDA and  has been authorized for detection and/or diagnosis of SARS-CoV-2 by FDA under an Emergency Use Authorization (EUA). This EUA will remain  in effect (meaning this test can be used) for the duration of  the COVID-19  declaration under Section 564(b)(1) of the Act, 21 U.S.C.section 360bbb-3(b)(1), unless the authorization is terminated  or revoked sooner.       Influenza A by PCR NEGATIVE NEGATIVE Final   Influenza B by PCR NEGATIVE NEGATIVE Final    Comment: (NOTE) The Xpert Xpress SARS-CoV-2/FLU/RSV plus assay is intended as an aid in the diagnosis of influenza from Nasopharyngeal swab specimens and should not be used as a sole basis for treatment. Nasal washings and aspirates are unacceptable for Xpert Xpress SARS-CoV-2/FLU/RSV testing.  Fact Sheet for Patients: EntrepreneurPulse.com.au  Fact Sheet for Healthcare Providers: IncredibleEmployment.be  This test is not yet approved or cleared by the [place] FDA and has been authorized for detection and/or diagnosis of SARS-CoV-2 by FDA under an Emergency Use Authorization (EUA). This EUA will remain in effect (meaning this test can be used) for the duration of the COVID-19 declaration under Section 564(b)(1) of the Act, 21 U.S.C. section 360bbb-3(b)(1), unless the authorization is terminated or revoked.  Performed at [facility], [place] [address] [place], [place] [address]   MRSA Next Gen by PCR, Nasal     Status: None   Collection Time: 12/28/20  7:32 PM   Specimen: Nasal Mucosa; Nasal Swab  Result Value Ref Range Status   MRSA by PCR Next Gen NOT DETECTED NOT DETECTED Final    Comment: (NOTE) The GeneXpert MRSA Assay (FDA approved for NASAL specimens only), is one component of a comprehensive MRSA colonization surveillance program. It is not intended to diagnose MRSA infection nor to guide or monitor treatment for MRSA infections. Test performance is not FDA approved in patients less than 74 years old. Performed at [facility], [place] [address], [place], [place] [address]   Culture, Respiratory w Gram Stain     Status: None   Collection Time: 12/31/20 11:06 AM    Specimen: Tracheal Aspirate; Respiratory  Result Value Ref Range Status   Specimen Description TRACHEAL ASPIRATE  Final   Special Requests NONE  Final   Gram Stain   Final    FEW SQUAMOUS EPITHELIAL CELLS PRESENT FEW WBC PRESENT,BOTH PMN AND MONONUCLEAR FEW GRAM POSITIVE COCCI Performed at [facility], [place] [address], [place], [place] [address]    Culture   Final    FEW PSEUDOMONAS AERUGINOSA FEW STREPTOCOCCUS PNEUMONIAE    Report Status 01/03/2021 FINAL  Final   Organism ID, Bacteria PSEUDOMONAS AERUGINOSA  Final   Organism ID, Bacteria STREPTOCOCCUS PNEUMONIAE  Final      Susceptibility   Pseudomonas aeruginosa - MIC*    CEFTAZIDIME 4 SENSITIVE Sensitive     CIPROFLOXACIN <=0.25 SENSITIVE Sensitive     GENTAMICIN <=1 SENSITIVE Sensitive     IMIPENEM 2 SENSITIVE Sensitive     PIP/TAZO 8 SENSITIVE Sensitive     CEFEPIME 2 SENSITIVE Sensitive     * FEW PSEUDOMONAS AERUGINOSA   Streptococcus pneumoniae - MIC*    ERYTHROMYCIN 4 RESISTANT Resistant     LEVOFLOXACIN 0.5 SENSITIVE Sensitive     VANCOMYCIN <=0.12 SENSITIVE Sensitive     PENO - penicillin <=0.06      PENICILLIN (non-meningitis) <=0.06 SENSITIVE Sensitive     PENICILLIN (oral) <=0.06 SENSITIVE Sensitive     CEFTRIAXONE (non-meningitis) <=0.12 SENSITIVE Sensitive     * FEW STREPTOCOCCUS PNEUMONIAE  Culture, blood (routine x 2)     Status: None   Collection Time: 01/05/21 11:44 AM   Specimen: BLOOD  Result Value Ref Range Status   Specimen Description  BLOOD SITE NOT SPECIFIED  Final   Special Requests AEROBIC BOTTLE ONLY Blood Culture adequate volume  Final   Culture   Final    NO GROWTH 5 DAYS Performed at [facility], [place] [address], [place], [place] [address]    Report Status 01/10/2021 FINAL  Final  Culture, blood (routine x 2)     Status: None   Collection Time: 01/05/21 11:44 AM   Specimen: BLOOD  Result Value Ref Range Status   Specimen Description BLOOD SITE NOT SPECIFIED  Final   Special  Requests   Final    AEROBIC BOTTLE ONLY Blood Culture results may not be optimal due to an inadequate volume of blood received in culture bottles   Culture   Final    NO GROWTH 5 DAYS Performed at [facility], [place] [address], [place], [place] [address]    Report Status 01/10/2021 FINAL  Final  Surgical PCR screen     Status: None   Collection Time: 01/08/21 12:14 AM   Specimen: Nasal Mucosa; Nasal Swab  Result Value Ref Range Status   MRSA, PCR NEGATIVE NEGATIVE Final   Staphylococcus aureus NEGATIVE NEGATIVE Final    Comment: (NOTE) The Xpert SA Assay (FDA approved for NASAL specimens in patients 68 years of age and older), is one component of a comprehensive surveillance program. It is not intended to diagnose infection nor to guide or monitor treatment. Performed at [facility], [place] [address], [place], [place] [address]   Culture, Respiratory w Gram Stain     Status: None   Collection Time: 01/08/21  1:24 PM   Specimen: Tracheal Aspirate; Respiratory  Result Value Ref Range Status   Specimen Description TRACHEAL ASPIRATE  Final   Special Requests NONE  Final   Gram Stain   Final    RARE SQUAMOUS EPITHELIAL CELLS PRESENT MODERATE WBC PRESENT, PREDOMINANTLY MONONUCLEAR FEW GRAM NEGATIVE RODS Performed at [facility], [place] [address] [place],  [address]    Culture   Final    RARE PSEUDOMONAS AERUGINOSA RARE ENTEROCOCCUS FAECALIS    Report Status 01/11/2021 FINAL  Final   Organism ID, Bacteria PSEUDOMONAS AERUGINOSA  Final   Organism ID, Bacteria ENTEROCOCCUS FAECALIS  Final      Susceptibility   Enterococcus faecalis - MIC*    AMPICILLIN <=2 SENSITIVE Sensitive     VANCOMYCIN 1 SENSITIVE Sensitive     GENTAMICIN SYNERGY SENSITIVE Sensitive     * RARE ENTEROCOCCUS FAECALIS   Pseudomonas aeruginosa - MIC*    CEFTAZIDIME 4 SENSITIVE Sensitive     CIPROFLOXACIN <=0.25 SENSITIVE Sensitive     GENTAMICIN <=1 SENSITIVE Sensitive     IMIPENEM 2  SENSITIVE Sensitive     PIP/TAZO 8 SENSITIVE Sensitive     CEFEPIME 2 SENSITIVE Sensitive     * RARE PSEUDOMONAS AERUGINOSA  Gastrointestinal Panel by PCR , Stool     Status: None   Collection Time: 01/08/21  5:04 PM   Specimen: Stool  Result Value Ref Range Status   Campylobacter species NOT DETECTED NOT DETECTED Final   Plesimonas shigelloides NOT DETECTED NOT DETECTED Final   Salmonella species NOT DETECTED NOT DETECTED Final   Yersinia enterocolitica NOT DETECTED NOT DETECTED Final   Vibrio species NOT DETECTED NOT DETECTED Final   Vibrio cholerae NOT DETECTED NOT DETECTED Final   Enteroaggregative E coli (EAEC) NOT DETECTED NOT DETECTED Final   Enteropathogenic E coli (EPEC) NOT DETECTED NOT DETECTED Final  Enterotoxigenic E coli (ETEC) NOT DETECTED NOT DETECTED Final   Shiga like toxin producing E coli (STEC) NOT DETECTED NOT DETECTED Final   Shigella/Enteroinvasive E coli (EIEC) NOT DETECTED NOT DETECTED Final   Cryptosporidium NOT DETECTED NOT DETECTED Final   Cyclospora cayetanensis NOT DETECTED NOT DETECTED Final   Entamoeba histolytica NOT DETECTED NOT DETECTED Final   Giardia lamblia NOT DETECTED NOT DETECTED Final   Adenovirus F40/41 NOT DETECTED NOT DETECTED Final   Astrovirus NOT DETECTED NOT DETECTED Final   Norovirus GI/GII NOT DETECTED NOT DETECTED Final   Rotavirus A NOT DETECTED NOT DETECTED Final   Sapovirus (I, II, IV, and V) NOT DETECTED NOT DETECTED Final    Comment: Performed at [facility], [place] [place], [place] [address]  C Difficile Quick Screen (NO PCR Reflex)     Status: None   Collection Time: 01/09/21 11:07 AM   Specimen: STOOL  Result Value Ref Range Status   C Diff antigen NEGATIVE NEGATIVE Final   C Diff toxin NEGATIVE NEGATIVE Final   C Diff interpretation No C. difficile detected.  Final    Comment: Performed at [facility], [place] [address] [place], [place] [address]    Anti-infectives:  Anti-infectives (From  admission, onward)    Start     Dose/Rate Route Frequency Ordered Stop   01/11/21 2330  ceFEPIme (MAXIPIME) 2 g in sodium chloride 0.9 % 100 mL IVPB        2 g 200 mL/hr over 30 Minutes Intravenous Every 24 hours 01/11/21 0711     01/10/21 1645  ampicillin (OMNIPEN) 2 g in sodium chloride 0.9 % 100 mL IVPB        2 g 300 mL/hr over 20 Minutes Intravenous Every 8 hours 01/10/21 1549     01/09/21 2200  ceFEPIme (MAXIPIME) 2 g in sodium chloride 0.9 % 100 mL IVPB  Status:  Discontinued        2 g 200 mL/hr over 30 Minutes Intravenous Every 12 hours 01/09/21 1458 01/11/21 0711   01/08/21 1515  metroNIDAZOLE (FLAGYL) IVPB 500 mg  Status:  Discontinued        500 mg 100 mL/hr over 60 Minutes Intravenous Every 8 hours 01/08/21 1428 01/10/21 1618   01/03/21 0600  vancomycin (VANCOREADY) IVPB 1250 mg/250 mL  Status:  Discontinued        1,250 mg 166.7 mL/hr over 90 Minutes Intravenous Every 12 hours 01/02/21 1717 01/03/21 0837   01/02/21 1800  vancomycin (VANCOREADY) IVPB 2000 mg/400 mL        2,000 mg 200 mL/hr over 120 Minutes Intravenous  Once 01/02/21 1712 01/02/21 2007   01/02/21 0900  ceFEPIme (MAXIPIME) 2 g in sodium chloride 0.9 % 100 mL IVPB  Status:  Discontinued        2 g 200 mL/hr over 30 Minutes Intravenous Every 8 hours 01/02/21 0849 01/09/21 1458       Best Practice/Protocols:  VTE Prophylaxis: Direct Thrombin Inhibitor Continous Sedation  Consults: Treatment Team:  Izora Gala, MD Georganna Skeans, MD Roney Jaffe, MD    Studies:    Events:  Subjective:    Overnight Issues:   Objective:  Vital signs for last 24 hours: Temp:  [97.8 F (36.6 C)-98.1 F (36.7 C)] 97.9 F (36.6 C) (08/30 0400) Pulse Rate:  [38-131] 41 (08/30 0600) Resp:  [15-33] 20 (08/30 0600) BP: (97-204)/(45-87) 97/45 (08/30 0600) SpO2:  [90 %-100 %] 100 % (08/30 0600) Arterial Line BP: (  105-228)/(42-91) 109/44 (08/30 0600) FiO2 (%):  [40 %-80 %] 80 % (08/30 0135)  Hemodynamic  parameters for last 24 hours:    Intake/Output from previous day: 08/29 0701 - 08/30 0700 In: 2764.9 [I.V.:1251; NG/GT:704.3; IV Piggyback:809.7] Out: 4410 [Urine:4300; Stool:100; Blood:10]  Intake/Output this shift: No intake/output data recorded.  Vent settings for last 24 hours: Vent Mode: PRVC FiO2 (%):  [40 %-80 %] 80 % Set Rate:  [20 bmp] 20 bmp Vt Set:  [650 mL] 650 mL PEEP:  [5 cmH20] 5 cmH20 Plateau Pressure:  [23 [id number] cmH20] 26 cmH20  Physical Exam:  General: on vent Neuro: opens eyes, not F/C, occ myoclonus HEENT/Neck: trach-clean, intact Resp: clear to auscultation bilaterally CVS: RRR 40 GI: softer, less distended Extremities: edema 1+  Results for orders placed or performed during the hospital encounter of 12/28/20 (from the past 24 hour(s))  Glucose, capillary     Status: Abnormal   Collection Time: 01/14/21  8:15 AM  Result Value Ref Range   Glucose-Capillary 64 (L) 70 - 99 mg/dL  Glucose, capillary     Status: None   Collection Time: 01/14/21  9:11 AM  Result Value Ref Range   Glucose-Capillary 94 70 - 99 mg/dL  Protein / creatinine ratio, urine     Status: Abnormal   Collection Time: 01/14/21 10:52 AM  Result Value Ref Range   Creatinine, Urine 29.86 mg/dL   Total Protein, Urine 56 mg/dL   Protein Creatinine Ratio 1.88 (H) 0.00 - 0.15 mg/mg[Cre]  Urinalysis, Complete w Microscopic     Status: Abnormal   Collection Time: 01/14/21 10:52 AM  Result Value Ref Range   Color, Urine YELLOW YELLOW   APPearance HAZY (A) CLEAR   Specific Gravity, Urine 1.011 1.005 - 1.030   pH 5.0 5.0 - 8.0   Glucose, UA NEGATIVE NEGATIVE mg/dL   Hgb urine dipstick MODERATE (A) NEGATIVE   Bilirubin Urine NEGATIVE NEGATIVE   Ketones, ur NEGATIVE NEGATIVE mg/dL   Protein, ur NEGATIVE NEGATIVE mg/dL   Nitrite NEGATIVE NEGATIVE   Leukocytes,Ua NEGATIVE NEGATIVE   RBC / HPF 0-5 0 - 5 RBC/hpf   WBC, UA 0-5 0 - 5 WBC/hpf   Bacteria, UA RARE (A) NONE SEEN   Squamous  Epithelial / LPF 0-5 0 - 5   Mucus PRESENT    Hyaline Casts, UA PRESENT   Glucose, capillary     Status: None   Collection Time: 01/14/21 11:33 AM  Result Value Ref Range   Glucose-Capillary 87 70 - 99 mg/dL  Glucose, capillary     Status: None   Collection Time: 01/14/21  4:57 PM  Result Value Ref Range   Glucose-Capillary 91 70 - 99 mg/dL  Glucose, capillary     Status: Abnormal   Collection Time: 01/14/21  7:45 PM  Result Value Ref Range   Glucose-Capillary 145 (H) 70 - 99 mg/dL  Glucose, capillary     Status: Abnormal   Collection Time: 01/14/21 11:49 PM  Result Value Ref Range   Glucose-Capillary 149 (H) 70 - 99 mg/dL  CBC     Status: Abnormal   Collection Time: 01/15/21  2:01 AM  Result Value Ref Range   WBC 10.1 4.0 - 10.5 K/uL   RBC 3.03 (L) 4.22 - 5.81 MIL/uL   Hemoglobin 7.9 (L) 13.0 - 17.0 g/dL   HCT 25.9 (L) 39.0 - 52.0 %   MCV 85.5 80.0 - 100.0 fL   MCH 26.1 26.0 - 34.0 pg   MCHC 30.5  30.0 - 36.0 g/dL   RDW 17.9 (H) 11.5 - 15.5 %   Platelets 572 (H) 150 - 400 K/uL   nRBC 0.2 0.0 - 0.2 %  APTT     Status: Abnormal   Collection Time: 01/15/21  2:01 AM  Result Value Ref Range   aPTT 60 (H) 24 - 36 seconds  Renal function panel     Status: Abnormal   Collection Time: 01/15/21  2:01 AM  Result Value Ref Range   Sodium 146 (H) 135 - 145 mmol/L   Potassium 3.2 (L) 3.5 - 5.1 mmol/L   Chloride 117 (H) 98 - 111 mmol/L   CO2 15 (L) 22 - 32 mmol/L   Glucose, Bld 150 (H) 70 - 99 mg/dL   BUN 127 (H) 8 - 23 mg/dL   Creatinine, Ser 4.90 (H) 0.61 - 1.24 mg/dL   Calcium 8.2 (L) 8.9 - 10.3 mg/dL   Phosphorus 7.9 (H) 2.5 - 4.6 mg/dL   Albumin 1.7 (L) 3.5 - 5.0 g/dL   GFR, Estimated 12 (L) >60 mL/min   Anion gap 14 5 - 15  Glucose, capillary     Status: Abnormal   Collection Time: 01/15/21  3:34 AM  Result Value Ref Range   Glucose-Capillary 122 (H) 70 - 99 mg/dL    Assessment & Plan: Present on Admission: **None**    LOS: 18 days   Additional comments:I  reviewed the patient's new clinical lab test results. And CXR Fall down stairs 8/12   VDRF - guaifenisen, wean, S/P trach 8/29 by Dr. Bobbye Morton ID - resp CX now with pseud and enterococcus - maxipime/ampicillin. CT A/P with ascending colitis and distention. Stool studies and C. dif are all negative TBI/SAH/SDH - NSGY c/s, Dr. Annette Stable, starting to F/C. Significant frontal lobe injuries. Keppra x7d for sz ppx. Follows commands this AM. Occipital bone fx - NSGY c/s, Dr. Annette Stable Temporal bone fx extending into middle ear - ENT c/s, Dr. Constance Holster Right TM Rupture - ENT c/s, Dr. Constance Holster AFRVR - amio off, appreciate Cardiology F/U, HR 40 but BP OK ABL anemia  Bilateral pulmonary embolism - bival AKI - CRT down to 4.9, diuresing per Renal - 4.3L/24h Hx DM2 - resistant SSI, glargine 43u BID has been held but may resume with TF Hx HTN - PRN meds FEN - NPO, resume TF VTE - SCDs, bival gtt Foley - removed Dispo - ICU, MS much better, wean  Critical Care Total Time*: 40 Minutes  Georganna Skeans, MD, MPH, FACS Trauma & General Surgery Use [email or web address] to contact on call provider  01/15/2021  *Care during the described time interval was provided by me. I have reviewed this patient's available data, including medical history, events of note, physical examination and test results as part of my evaluation.

## 2021-01-15 NOTE — Progress Notes (Signed)
Patient ID: Angel [name], male   DOB: 07/18/1950, 70 y.o.   MRN: [id number] S: s/p trach 8/29 followed by post-op HTN and tachycardia, afib with RVR. O:BP (!) 97/45   Pulse 71   Temp 97.8 F (36.6 C)   Resp 17   Ht 6' 2" (1.88 m)   Wt (!) 139.1 kg   SpO2 95%   BMI 39.37 kg/m   Intake/Output Summary (Last 24 hours) at 01/15/2021 1053 Last data filed at 01/15/2021 1000 Gross per 24 hour  Intake 4893.72 ml  Output 5135 ml  Net -241.28 ml   Intake/Output: I/O last 3 completed shifts: In: 23 [I.V.:1794.2; NG/GT:2304.3; IV Piggyback:1309.6] Out: 6810 [Urine:6700; Stool:100; Blood:10]  Intake/Output this shift:  Total I/O In: 528.9 [I.V.:113.3; NG/GT:125.8; IV Piggyback:289.7] Out: 725 [Urine:650; Stool:75] Weight change:  Gen:on vent via trach CVS:IRR, no rub Resp: ventilated BS bilaterally Abd:+BS, obese, soft Ext:1+ edema/anasarca of extremities  Recent Labs  Lab 01/09/21 0957 01/10/21 0238 01/10/21 1346 01/11/21 0603 01/12/21 0552 01/13/21 0500 01/14/21 0500 01/15/21 0201  NA 143 144 141 146* 147* 147* 147* 146*  K 3.9 4.4 3.5 3.2* 3.6 3.6 3.1* 3.2*  CL 113* 114* 111 115* 118* 120* 118* 117*  CO2 23 19* 20* 19* 18* 16* 16* 15*  GLUCOSE 223* 207* 317* 119* 71 106* 69* 150*  BUN 71* 89* 94* 112* 124* 134* 131* 127*  CREATININE 1.85* 2.08* 2.77* 3.43* 4.49* 4.96* 5.04* 4.90*  ALBUMIN  --   --  1.7*  --   --   --   --  1.7*  CALCIUM 7.8* 8.0* 7.9* 8.2* 8.0* 7.8* 8.0* 8.2*  PHOS 4.1 4.2  --  5.4* 6.6*  --   --  7.9*  AST  --   --  30  --   --   --   --   --   ALT  --   --  34  --   --   --   --   --    Liver Function Tests: Recent Labs  Lab 01/10/21 1346 01/15/21 0201  AST 30  --   ALT 34  --   ALKPHOS 53  --   BILITOT 0.4  --   PROT 5.7*  --   ALBUMIN 1.7* 1.7*   No results for input(s): LIPASE, AMYLASE in the last 168 hours. No results for input(s): AMMONIA in the last 168 hours. CBC: Recent Labs  Lab 01/11/21 0603 01/12/21 0552 01/13/21 0500  01/13/21 1245 01/14/21 0500 01/15/21 0201  WBC 14.6* 9.0 7.5  --  9.7 10.1  HGB 8.0* 7.3* 6.8* 7.9* 7.7* 7.9*  HCT 26.1* 23.9* 22.4* 26.3* 25.0* 25.9*  MCV 82.3 83.9 85.2  --  83.6 85.5  PLT 396 409* 432*  --  518* 572*   Cardiac Enzymes: No results for input(s): CKTOTAL, CKMB, CKMBINDEX, TROPONINI in the last 168 hours. CBG: Recent Labs  Lab 01/14/21 1657 01/14/21 1945 01/14/21 2349 01/15/21 0334 01/15/21 0811  GLUCAP 91 145* 149* 122* 99    Iron Studies: No results for input(s): IRON, TIBC, TRANSFERRIN, FERRITIN in the last 72 hours. Studies/Results: US RENAL  Result Date: 01/13/2021 CLINICAL DATA:  Acute renal insufficiency EXAM: RENAL / URINARY TRACT ULTRASOUND COMPLETE COMPARISON:  None. FINDINGS: Right Kidney: Renal measurements: 14.9 x 6.6 x 6.3 cm = volume: 324 mL. Contains a 4.8 cm simple cyst. Left Kidney: Renal measurements: 16.2 x 7.4 x 6.9 cm = volume: 433 mL. Contains a 3.9 cm simple cyst.  Bladder: Appears normal for degree of bladder distention. Other: None. IMPRESSION: A 4.8 cm simple cyst is seen in the right kidney and a 3.9 cm simple cyst is seen in the left kidney. Neither cyst requires follow-up. No other abnormalities. Electronically Signed   By: Dorise Bullion III M.D.   On: 01/13/2021 16:22   DG Chest Port 1 View  Result Date: 01/15/2021 CLINICAL DATA:  Respiratory failure EXAM: PORTABLE CHEST 1 VIEW COMPARISON:  01/14/2021 FINDINGS: Cardiac shadow remains enlarged. Tracheostomy tube and gastric catheter are again seen and stable. Right-sided PICC line is noted in the right atrium stable in appearance. Right-sided pleural effusion is noted similar to that seen on the prior exam. Persistent vascular congestion is noted. No bony abnormality is noted. IMPRESSION: Vascular congestion and right-sided effusion stable from the prior exam. Electronically Signed   By: Inez Catalina M.D.   On: 01/15/2021 08:55   DG Chest Port 1 View  Result Date: 01/14/2021 CLINICAL  DATA:  Respiratory failure. EXAM: PORTABLE CHEST 1 VIEW COMPARISON:  Chest radiograph dated 01/14/2021. FINDINGS: Tracheostomy remains above the carina and enteric tube with tip in the proximal stomach. The enteric tube has been pulled back. Right-sided PICC with tip close to the cavoatrial junction. Cardiomegaly with vascular congestion and edema. Small bilateral pleural effusions and bibasilar atelectasis or infiltrate. No pneumothorax. No acute osseous pathology. IMPRESSION: Cardiomegaly with findings of CHF similar or slightly worsened since the prior radiograph. Superimposed pneumonia is not excluded clinical correlation is recommended. Electronically Signed   By: Anner Crete M.D.   On: 01/14/2021 19:11   DG CHEST PORT 1 VIEW  Result Date: 01/14/2021 CLINICAL DATA:  Respiratory failure, ventilatory support EXAM: PORTABLE CHEST 1 VIEW COMPARISON:  01/11/2021 FINDINGS: Stable cardiomegaly and vascular congestion. Improving basilar atelectasis. Posterior layering right effusion noted. No pneumothorax. Endotracheal tube 6 cm above the carina. NG tube enters the stomach with the tip not visualized. Degenerative changes of the spine. IMPRESSION: Stable cardiomegaly and vascular congestion. Improving basilar atelectasis Small posterior layering right effusion Electronically Signed   By: Jerilynn Mages.  Shick M.D.   On: 01/14/2021 08:06    acetaminophen  1,000 mg Per Tube Q6H   bethanechol  25 mg Per Tube TID   chlorhexidine gluconate (MEDLINE KIT)  15 mL Mouth Rinse BID   Chlorhexidine Gluconate Cloth  6 each Topical Q0600   dextrose  12.5 g Intravenous Once   docusate  100 mg Per Tube BID   fentaNYL (SUBLIMAZE) injection  25 mcg Intravenous Once   free water  200 mL Per Tube Q8H   furosemide  80 mg Intravenous Q8H   guaiFENesin  10 mL Per Tube Q4H   insulin aspart  0-20 Units Subcutaneous Q4H   insulin aspart  10 Units Subcutaneous Q4H   insulin glargine-yfgn  43 Units Subcutaneous BID   mouth rinse  15 mL  Mouth Rinse 10 times per day   methocarbamol  1,000 mg Per Tube Q8H   pantoprazole sodium  40 mg Per Tube Daily   polyethylene glycol  17 g Per Tube Daily   senna  1 tablet Per Tube Daily   sodium chloride flush  10-40 mL Intracatheter Q12H    BMET    Component Value Date/Time   NA 146 (H) 01/15/2021 0201   K 3.2 (L) 01/15/2021 0201   CL 117 (H) 01/15/2021 0201   CO2 15 (L) 01/15/2021 0201   GLUCOSE 150 (H) 01/15/2021 0201   BUN 127 (H) 01/15/2021 0201  CREATININE 4.90 (H) 01/15/2021 0201   CALCIUM 8.2 (L) 01/15/2021 0201   GFRNONAA 12 (L) 01/15/2021 0201   CBC    Component Value Date/Time   WBC 10.1 01/15/2021 0201   RBC 3.03 (L) 01/15/2021 0201   HGB 7.9 (L) 01/15/2021 0201   HCT 25.9 (L) 01/15/2021 0201   PLT 572 (H) 01/15/2021 0201   MCV 85.5 01/15/2021 0201   MCH 26.1 01/15/2021 0201   MCHC 30.5 01/15/2021 0201   RDW 17.9 (H) 01/15/2021 0201    Assessment/ Plan: AKI - Multifactorial including ischemic ATN in setting of hypotensive episode/ sepsis earlier in hosp stay complicated by contrast injury.  BP's stable now.  Nonoliguric, patient making urine.  UNa 26, had some proteinuria on UA earlier in hospitalization and albumin of 1.7.  Will recheck UA and order 24 hour urine for protein.   Marked UOP with IV lasix and BUN/Scr appears to have reached a plateau.   Will give another dose of IV albumin to help with intravascular volume depletion and extravascular edema. May require CRRT if BUN/Cr continue to climb given his high intake demands, could not manage with IHD. No urgent indication for HD at this time. Will reassess on a daily basis any need for RRT, have d/w Gen Surgery. Will follow.  Fall/ bilat SAH - no surgery required, fall was on 12/28/20 Resp failure - vent support since admission Hypernatremia - free water deficit is approximately 3 liters, will add free water to tube feeds and follow. Resp infection - grew strep/ pseudomonas from resp secretions and was  rx'd w/ IV abx from 8/17 to current (only 1 dose IV vanc given on 8/17 then dc'd).  Atrial fib / RVR - in sinus rhythm now Anasarca - as above.  He does have low albumin and is responding to IV lasix. Will check 24 hour urine for protein as well as SPEP/UPEP.  IV albumin to help with intravascular volume depletion/3rd spacing Hypokalemia - will need more runs of KCl 10 mEq and follow.     A. , MD [facility] [phone]  

## 2021-01-15 NOTE — Progress Notes (Signed)
Pt bradycardic in the 40's. Pt's MAP <65 per arterial line. Pt's cuff pressure normotensive. Paged Trauma. Ordered to turn down Fentanyl, and continue monitoring.

## 2021-01-15 NOTE — Progress Notes (Signed)
SLP Cancellation Note  Patient Details Name: NICKLOUS ABURTO MRN: [id number] DOB: 03/26/1951   Cancelled treatment:   Reason Eval/Treat Not Completed: Patient not medically ready (on vent sedated with cardene drip. Will follow for readiness.)  Dewitt Rota, SLP-Student        Dewitt Rota 01/15/2021, 8:04 AM

## 2021-01-16 ENCOUNTER — Inpatient Hospital Stay (HOSPITAL_COMMUNITY): Payer: PPO

## 2021-01-16 DIAGNOSIS — R001 Bradycardia, unspecified: Secondary | ICD-10-CM | POA: Diagnosis not present

## 2021-01-16 DIAGNOSIS — Z452 Encounter for adjustment and management of vascular access device: Secondary | ICD-10-CM | POA: Diagnosis not present

## 2021-01-16 DIAGNOSIS — J811 Chronic pulmonary edema: Secondary | ICD-10-CM | POA: Diagnosis not present

## 2021-01-16 DIAGNOSIS — J90 Pleural effusion, not elsewhere classified: Secondary | ICD-10-CM | POA: Diagnosis not present

## 2021-01-16 DIAGNOSIS — I4819 Other persistent atrial fibrillation: Secondary | ICD-10-CM

## 2021-01-16 DIAGNOSIS — Z4682 Encounter for fitting and adjustment of non-vascular catheter: Secondary | ICD-10-CM | POA: Diagnosis not present

## 2021-01-16 DIAGNOSIS — I480 Paroxysmal atrial fibrillation: Secondary | ICD-10-CM | POA: Diagnosis not present

## 2021-01-16 LAB — CBC
HCT: 25.4 % — ABNORMAL LOW (ref 39.0–52.0)
Hemoglobin: 7.7 g/dL — ABNORMAL LOW (ref 13.0–17.0)
MCH: 25.8 pg — ABNORMAL LOW (ref 26.0–34.0)
MCHC: 30.3 g/dL (ref 30.0–36.0)
MCV: 85.2 fL (ref 80.0–100.0)
Platelets: 542 10*3/uL — ABNORMAL HIGH (ref 150–400)
RBC: 2.98 MIL/uL — ABNORMAL LOW (ref 4.22–5.81)
RDW: 18.1 % — ABNORMAL HIGH (ref 11.5–15.5)
WBC: 9.6 10*3/uL (ref 4.0–10.5)
nRBC: 0 % (ref 0.0–0.2)

## 2021-01-16 LAB — BASIC METABOLIC PANEL
Anion gap: 16 — ABNORMAL HIGH (ref 5–15)
BUN: 134 mg/dL — ABNORMAL HIGH (ref 8–23)
CO2: 16 mmol/L — ABNORMAL LOW (ref 22–32)
Calcium: 8.2 mg/dL — ABNORMAL LOW (ref 8.9–10.3)
Chloride: 115 mmol/L — ABNORMAL HIGH (ref 98–111)
Creatinine, Ser: 5.09 mg/dL — ABNORMAL HIGH (ref 0.61–1.24)
GFR, Estimated: 11 mL/min — ABNORMAL LOW (ref >60)
Glucose, Bld: 220 mg/dL — ABNORMAL HIGH (ref 70–99)
Potassium: 3.9 mmol/L (ref 3.5–5.1)
Sodium: 147 mmol/L — ABNORMAL HIGH (ref 135–145)

## 2021-01-16 LAB — PROTEIN, URINE, 24 HOUR
Collection Interval-UPROT: 24 hours
Protein, 24H Urine: 2320 mg/d — ABNORMAL HIGH (ref 50–100)
Protein, Urine: 58 mg/dL
Urine Total Volume-UPROT: 4000 mL

## 2021-01-16 LAB — RENAL FUNCTION PANEL
Albumin: 1.8 g/dL — ABNORMAL LOW (ref 3.5–5.0)
Anion gap: 13 (ref 5–15)
BUN: 128 mg/dL — ABNORMAL HIGH (ref 8–23)
CO2: 17 mmol/L — ABNORMAL LOW (ref 22–32)
Calcium: 8.1 mg/dL — ABNORMAL LOW (ref 8.9–10.3)
Chloride: 116 mmol/L — ABNORMAL HIGH (ref 98–111)
Creatinine, Ser: 4.8 mg/dL — ABNORMAL HIGH (ref 0.61–1.24)
GFR, Estimated: 12 mL/min — ABNORMAL LOW (ref >60)
Glucose, Bld: 311 mg/dL — ABNORMAL HIGH (ref 70–99)
Phosphorus: 6.9 mg/dL — ABNORMAL HIGH (ref 2.5–4.6)
Potassium: 3 mmol/L — ABNORMAL LOW (ref 3.5–5.1)
Sodium: 146 mmol/L — ABNORMAL HIGH (ref 135–145)

## 2021-01-16 LAB — GLUCOSE, CAPILLARY
Glucose-Capillary: 194 mg/dL — ABNORMAL HIGH (ref 70–99)
Glucose-Capillary: 207 mg/dL — ABNORMAL HIGH (ref 70–99)
Glucose-Capillary: 208 mg/dL — ABNORMAL HIGH (ref 70–99)
Glucose-Capillary: 257 mg/dL — ABNORMAL HIGH (ref 70–99)
Glucose-Capillary: 259 mg/dL — ABNORMAL HIGH (ref 70–99)
Glucose-Capillary: 283 mg/dL — ABNORMAL HIGH (ref 70–99)

## 2021-01-16 LAB — SODIUM, STOOL: Sodium, Stl: <20 mmol/L

## 2021-01-16 LAB — APTT
aPTT: 77 s — ABNORMAL HIGH (ref 24–36)
aPTT: 72 s — ABNORMAL HIGH (ref 24–36)
aPTT: 68 s — ABNORMAL HIGH (ref 24–36)

## 2021-01-16 LAB — TRIGLYCERIDES: Triglycerides: 147 mg/dL (ref <150)

## 2021-01-16 LAB — IMMUNOFIXATION, URINE

## 2021-01-16 MED ORDER — METOPROLOL TARTRATE 50 MG PO TABS
50.0000 mg | ORAL_TABLET | Freq: Two times a day (BID) | ORAL | Status: DC
  Administered 2021-01-17: 50 mg
  Filled 2021-01-16: qty 1

## 2021-01-16 MED ORDER — SODIUM BICARBONATE 650 MG PO TABS
1300.0000 mg | ORAL_TABLET | Freq: Two times a day (BID) | ORAL | Status: DC
  Administered 2021-01-16 – 2021-01-21 (×11): 1300 mg
  Filled 2021-01-16 (×11): qty 2

## 2021-01-16 MED ORDER — ALBUMIN HUMAN 25 % IV SOLN
12.5000 g | Freq: Once | INTRAVENOUS | Status: AC
  Administered 2021-01-16: 12.5 g via INTRAVENOUS
  Filled 2021-01-16: qty 50

## 2021-01-16 MED ORDER — HYDROMORPHONE HCL 1 MG/ML IJ SOLN
0.5000 mg | INTRAMUSCULAR | Status: DC | PRN
  Administered 2021-01-16 – 2021-01-17 (×8): 1 mg via INTRAVENOUS
  Filled 2021-01-16 (×8): qty 1

## 2021-01-16 MED ORDER — QUETIAPINE FUMARATE 25 MG PO TABS
50.0000 mg | ORAL_TABLET | Freq: Two times a day (BID) | ORAL | Status: DC
  Administered 2021-01-16 (×2): 50 mg
  Filled 2021-01-16 (×2): qty 2

## 2021-01-16 MED ORDER — METOPROLOL TARTRATE 25 MG PO TABS
25.0000 mg | ORAL_TABLET | Freq: Two times a day (BID) | ORAL | Status: DC
  Administered 2021-01-16 (×2): 25 mg
  Filled 2021-01-16 (×2): qty 1

## 2021-01-16 MED ORDER — METOPROLOL TARTRATE 25 MG PO TABS
25.0000 mg | ORAL_TABLET | Freq: Once | ORAL | Status: AC
  Administered 2021-01-17: 25 mg
  Filled 2021-01-16: qty 1

## 2021-01-16 MED ORDER — POTASSIUM CHLORIDE 20 MEQ PO PACK
40.0000 meq | PACK | ORAL | Status: AC
  Administered 2021-01-16 (×2): 40 meq
  Filled 2021-01-16 (×2): qty 2

## 2021-01-16 NOTE — Progress Notes (Addendum)
Patient ID: Angel [name], male   DOB: May 09, 1951, 70 y.o.   MRN: [id number] S: Had episode of Afib with RVR this morning.  Currently sitting up with PT. O:BP (!) 146/91 (BP Location: Left Leg)   Pulse (!) 122   Temp 98.1 F (36.7 C) (Axillary)   Resp 18   Ht 6' 2"  (1.88 m)   Wt (!) 139.1 kg   SpO2 95%   BMI 39.37 kg/m   Intake/Output Summary (Last 24 hours) at 01/16/2021 1232 Last data filed at 01/16/2021 1200 Gross per 24 hour  Intake 4923.32 ml  Output 4450 ml  Net 473.32 ml   Intake/Output: I/O last 3 completed shifts: In: 7421.1 [I.V.:2513.3; NG/GT:3768.1; IV Piggyback:1139.6] Out: 3329 [Urine:5950; Stool:375]  Intake/Output this shift:  Total I/O In: 1537.9 [I.V.:412.9; NG/GT:1125] Out: 700 [Urine:700] Weight change:  JJO:[id number] and unresponsive CVS: IRR IRR, tachy Resp:decreased BS bilaterally ZSW:FUXNA, hypoactive BS Ext:2+ anasarca  Recent Labs  Lab 01/10/21 0238 01/10/21 1346 01/11/21 0603 01/12/21 0552 01/13/21 0500 01/14/21 0500 01/15/21 0201 01/16/21 0536  NA 144 141 146* 147* 147* 147* 146* 146*  K 4.4 3.5 3.2* 3.6 3.6 3.1* 3.2* 3.0*  CL 114* 111 115* 118* 120* 118* 117* 116*  CO2 19* 20* 19* 18* 16* 16* 15* 17*  GLUCOSE 207* 317* 119* 71 106* 69* 150* 311*  BUN 89* 94* 112* 124* 134* 131* 127* 128*  CREATININE 2.08* 2.77* 3.43* 4.49* 4.96* 5.04* 4.90* 4.80*  ALBUMIN  --  1.7*  --   --   --   --  1.7* 1.8*  CALCIUM 8.0* 7.9* 8.2* 8.0* 7.8* 8.0* 8.2* 8.1*  PHOS 4.2  --  5.4* 6.6*  --   --  7.9* 6.9*  AST  --  30  --   --   --   --   --   --   ALT  --  34  --   --   --   --   --   --    Liver Function Tests: Recent Labs  Lab 01/10/21 1346 01/15/21 0201 01/16/21 0536  AST 30  --   --   ALT 34  --   --   ALKPHOS 53  --   --   BILITOT 0.4  --   --   PROT 5.7*  --   --   ALBUMIN 1.7* 1.7* 1.8*   No results for input(s): LIPASE, AMYLASE in the last 168 hours. No results for input(s): AMMONIA in the last 168 hours. CBC: Recent Labs  Lab  01/12/21 0552 01/13/21 0500 01/13/21 1245 01/14/21 0500 01/15/21 0201 01/16/21 0536  WBC 9.0 7.5  --  9.7 10.1 9.6  HGB 7.3* 6.8*   < > 7.7* 7.9* 7.7*  HCT 23.9* 22.4*   < > 25.0* 25.9* 25.4*  MCV 83.9 85.2  --  83.6 85.5 85.2  PLT 409* 432*  --  518* 572* 542*   < > = values in this interval not displayed.   Cardiac Enzymes: No results for input(s): CKTOTAL, CKMB, CKMBINDEX, TROPONINI in the last 168 hours. CBG: Recent Labs  Lab 01/15/21 1540 01/15/21 1945 01/15/21 2334 01/16/21 0349 01/16/21 0812  GLUCAP 151* 200* 260* 283* 257*    Iron Studies: No results for input(s): IRON, TIBC, TRANSFERRIN, FERRITIN in the last 72 hours. Studies/Results: DG CHEST PORT 1 VIEW  Result Date: 01/16/2021 CLINICAL DATA:  Pulmonary edema EXAM: PORTABLE CHEST 1 VIEW COMPARISON:  01/15/2021 FINDINGS: Tracheostomy tube in gastric catheter are  again noted and stable. Cardiac shadow is mildly enlarged but stable. Right-sided PICC line is again seen and unchanged in position. Lungs are well aerated bilaterally. Right-sided pleural effusion is noted slightly decreased from the prior exam although this may be positional in nature. No focal confluent infiltrate is noted. Mild vascular congestion is again seen. IMPRESSION: Stable vascular congestion. Right-sided effusion which appears slightly smaller than that noted on the prior exam. Tubes and lines as described. Electronically Signed   By: Inez Catalina M.D.   On: 01/16/2021 08:24   DG Chest Port 1 View  Result Date: 01/15/2021 CLINICAL DATA:  Respiratory failure EXAM: PORTABLE CHEST 1 VIEW COMPARISON:  01/14/2021 FINDINGS: Cardiac shadow remains enlarged. Tracheostomy tube and gastric catheter are again seen and stable. Right-sided PICC line is noted in the right atrium stable in appearance. Right-sided pleural effusion is noted similar to that seen on the prior exam. Persistent vascular congestion is noted. No bony abnormality is noted. IMPRESSION:  Vascular congestion and right-sided effusion stable from the prior exam. Electronically Signed   By: Inez Catalina M.D.   On: 01/15/2021 08:55   DG Chest Port 1 View  Result Date: 01/14/2021 CLINICAL DATA:  Respiratory failure. EXAM: PORTABLE CHEST 1 VIEW COMPARISON:  Chest radiograph dated 01/14/2021. FINDINGS: Tracheostomy remains above the carina and enteric tube with tip in the proximal stomach. The enteric tube has been pulled back. Right-sided PICC with tip close to the cavoatrial junction. Cardiomegaly with vascular congestion and edema. Small bilateral pleural effusions and bibasilar atelectasis or infiltrate. No pneumothorax. No acute osseous pathology. IMPRESSION: Cardiomegaly with findings of CHF similar or slightly worsened since the prior radiograph. Superimposed pneumonia is not excluded clinical correlation is recommended. Electronically Signed   By: Anner Crete M.D.   On: 01/14/2021 19:11    acetaminophen  1,000 mg Per Tube Q6H   bethanechol  25 mg Per Tube TID   chlorhexidine gluconate (MEDLINE KIT)  15 mL Mouth Rinse BID   Chlorhexidine Gluconate Cloth  6 each Topical Q0600   dextrose  12.5 g Intravenous Once   docusate  100 mg Per Tube BID   free water  200 mL Per Tube Q4H   furosemide  80 mg Intravenous Q8H   guaiFENesin  10 mL Per Tube Q4H   insulin aspart  0-20 Units Subcutaneous Q4H   insulin aspart  10 Units Subcutaneous Q4H   insulin glargine-yfgn  43 Units Subcutaneous BID   mouth rinse  15 mL Mouth Rinse 10 times per day   methocarbamol  1,000 mg Per Tube Q8H   metoprolol tartrate  25 mg Per Tube BID   pantoprazole sodium  40 mg Per Tube Daily   polyethylene glycol  17 g Per Tube Daily   potassium chloride  40 mEq Per Tube Q4H   QUEtiapine  50 mg Per Tube BID   senna  1 tablet Per Tube Daily   sodium bicarbonate  1,300 mg Per Tube BID   sodium chloride flush  10-40 mL Intracatheter Q12H    BMET    Component Value Date/Time   NA 146 (H) 01/16/2021 0536    K 3.0 (L) 01/16/2021 0536   CL 116 (H) 01/16/2021 0536   CO2 17 (L) 01/16/2021 0536   GLUCOSE 311 (H) 01/16/2021 0536   BUN 128 (H) 01/16/2021 0536   CREATININE 4.80 (H) 01/16/2021 0536   CALCIUM 8.1 (L) 01/16/2021 0536   GFRNONAA 12 (L) 01/16/2021 0536   CBC  Component Value Date/Time   WBC 9.6 01/16/2021 0536   RBC 2.98 (L) 01/16/2021 0536   HGB 7.7 (L) 01/16/2021 0536   HCT 25.4 (L) 01/16/2021 0536   PLT 542 (H) 01/16/2021 0536   MCV 85.2 01/16/2021 0536   MCH 25.8 (L) 01/16/2021 0536   MCHC 30.3 01/16/2021 0536   RDW 18.1 (H) 01/16/2021 0536    Assessment/ Plan: AKI - Multifactorial including ischemic ATN in setting of hypotensive episode/ sepsis earlier in hosp stay complicated by contrast injury.  BP's stable now.  Nonoliguric, patient making urine.  UNa 26, had some proteinuria on UA earlier in hospitalization and albumin of 1.7.  Will recheck UA and order 24 hour urine protein 2.3 grams.   Marked UOP with IV lasix and BUN/Scr appears to have reached a plateau.   Will continue IV albumin before IV lasix to help with intravascular volume depletion and extravascular edema. May require CRRT if BUN/Cr continue to climb given his high intake demands, could not manage with IHD.  I spoke with Dr. Bobbye Morton and agree with holding CRRT for now and concentrating his drips No urgent indication for HD at this time. Will reassess on a daily basis any need for RRT, have d/w Gen Surgery. Will follow.  Avoid Fleets enemas as will worsen renal failure Fall/ bilat SAH - no surgery required, fall was on 12/28/20 Resp failure - vent support since admission Hypernatremia - free water deficit is approximately 3 liters, will add free water to tube feeds and follow. Resp infection - grew strep/ pseudomonas from resp secretions and was rx'd w/ IV abx from 8/17 to current (only 1 dose IV vanc given on 8/17 then dc'd).  Atrial fib / RVR - in sinus rhythm now Anasarca - as above.  He does have low  albumin and is responding to IV lasix. Will check 24 hour urine for protein as well as SPEP/UPEP.  IV albumin to help with intravascular volume depletion/3rd spacing Hypokalemia - will need more runs of KCl 10 mEq and follow.  Metabolic acidosis - has worsened over the past 2 days, will give bicarb after K repleted.  Donetta Potts, MD [facility] [phone]

## 2021-01-16 NOTE — Evaluation (Addendum)
Physical Therapy Evaluation Patient Details Name: Angel [name] MRN: [id number] DOB: April 01, 1951 Today's Date: 01/16/2021   History of Present Illness  Angel [name] is a 70 y.o. 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 CT showed R temporal and parietal SAH, SAH anterior frontal lobes  bilaterally. Also sustained right occipital skull fracture, temporal bone fx, right TM rupture, bilateral PE. Intubated 8/12, trach'd 8/29. PMH: DM2, HTN   Clinical Impression  Pt admitted with above. At this time pt with a-fib and HR ranging from 60s-130s t/o TBI team eval today. Pt SPO2 >98% on vent via trach, BP reading via a-line [id number] systolic. RN present and aware. RN lifted all sedation medications. Pt placed in chair position in the bed via egress and opened eyes once PT pulled trunk away from back of bed. Pt continues with no command follow but did maintain eyes open t/o 12 min of sitting up. Pt very edematous t/o extremities limited PROM ability. Pt presenting as a Rancho Level III with generalized response to noxious stimuli and opening eyes to name/command however does not follow commands or actively move extremities on own at this time. Pt was indep PTA and recommending CIR upon d/c for maximal functional return however aware of medical complications and will continue to assess d/c recommendations and progress mobility as able.    Follow Up Recommendations CIR    Equipment Recommendations   (TBD)    Recommendations for Other Services Rehab consult     Precautions / Restrictions Precautions Precautions: Fall Precaution Comments: trach/vent, watch HR and BP, very edematous Restrictions Weight Bearing Restrictions: No      Mobility  Bed Mobility Overal bed mobility: Needs Assistance Bed Mobility:  (used egress in the bed to position pt in upright chair position as pt non-response even with sedation lifted, very edematous and not actively participating)           General bed  mobility comments: pt tolerated sitting in egress position for 12 min, pt didn't open eyes until PT brough trunk forward off the back of the bed/chair position, pt then maintained eyes opened t/o rest of session, no active participation except with trace effort to stick tongue out to verbal cues of SLP    Transfers                 General transfer comment: unsafe at this time  Ambulation/Gait             General Gait Details: unable at this time  Stairs            Wheelchair Mobility    Modified Rankin (Stroke Patients Only) Modified Rankin (Stroke Patients Only) Pre-Morbid Rankin Score: No symptoms Modified Rankin: Severe disability     Balance Overall balance assessment: Needs assistance Sitting-balance support: Feet unsupported;No upper extremity supported Sitting balance-Leahy Scale: Zero Sitting balance - Comments: dependent on egress in bed for support                                     Pertinent Vitals/Pain Pain Assessment: Faces Faces Pain Scale: Hurts a little bit Pain Location: noxious stimuli Pain Descriptors / Indicators: Grimacing Pain Intervention(s): Monitored during session    Home Living Family/patient expects to be discharged to:: Private residence Living Arrangements: Spouse/significant other Available Help at Discharge: Family;Available 24 hours/day Type of Home: House Home Access: Level entry     Home  Layout: One level Home Equipment: None Additional Comments: does have basement he would go down 2x/day    Prior Function Level of Independence: Independent         Comments: no AD, watched their grandchildren     Hand Dominance   Dominant Hand: Right    Extremity/Trunk Assessment   Upper Extremity Assessment Upper Extremity Assessment: Defer to OT evaluation (pt very edematous limiting ROM)    Lower Extremity Assessment Lower Extremity Assessment: Difficult to assess due to impaired cognition (pt  with no volitional movement, bilat LE very edemotous, toleratated PROM however limited by swelling)       Communication   Communication: Tracheostomy  Cognition Arousal/Alertness: Lethargic Behavior During Therapy: Flat affect Overall Cognitive Status: Impaired/Different from baseline Area of Impairment: Rancho level               Rancho Levels of Cognitive Functioning Rancho Los Amigos Scales of Cognitive Functioning: Localized response               General Comments: pt no command follow, pt did open eyes once placed in chair position, no volitional or purposeful movement, all sedative medicine turned off prior to eval      General Comments General comments (skin integrity, edema, etc.): pts HR increasing from 66 to 130s during therapy, RN present. BP also elevating from 150s to 170s, highest 952 systolic via a-line reading, RN present, increased cardizin,    Exercises Other Exercises Other Exercises: passive ROM to bilat ankles and knee, limited by swelling   Assessment/Plan    PT Assessment Patient needs continued PT services  PT Problem List Decreased strength;Decreased range of motion;Decreased activity tolerance;Decreased balance;Decreased mobility;Decreased knowledge of use of DME;Decreased safety awareness       PT Treatment Interventions DME instruction;Gait training;Stair training;Functional mobility training;Therapeutic activities;Therapeutic exercise;Balance training;Neuromuscular re-education;Cognitive remediation    PT Goals (Current goals can be found in the Care Plan section)  Acute Rehab PT Goals Patient Stated Goal: unable to state PT Goal Formulation: With patient/family Time For Goal Achievement: 01/24/21 Potential to Achieve Goals: Fair    Frequency Min 3X/week   Barriers to discharge        Co-evaluation PT/OT/SLP Co-Evaluation/Treatment: Yes Reason for Co-Treatment: Complexity of the patient's impairments (multi-system  involvement);Necessary to address cognition/behavior during functional activity (TBI team) PT goals addressed during session: Mobility/safety with mobility         AM-PAC PT "6 Clicks" Mobility  Outcome Measure Help needed turning from your back to your side while in a flat bed without using bedrails?: Total Help needed moving from lying on your back to sitting on the side of a flat bed without using bedrails?: Total Help needed moving to and from a bed to a chair (including a wheelchair)?: Total Help needed standing up from a chair using your arms (e.g., wheelchair or bedside chair)?: Total Help needed to walk in hospital room?: Total Help needed climbing 3-5 steps with a railing? : Total 6 Click Score: 6    End of Session Equipment Utilized During Treatment:  (vent, egress position in bed) Activity Tolerance: Patient tolerated treatment well Patient left: in bed;with call bell/phone within reach;with bed alarm set;with nursing/sitter in room;with family/visitor present Nurse Communication: Mobility status (RN present for therapy) PT Visit Diagnosis: Unsteadiness on feet (R26.81);Muscle weakness (generalized) (M62.81);Difficulty in walking, not elsewhere classified (R26.2)    Time: 1122-1202 PT Time Calculation (min) (ACUTE ONLY): 40 min   Charges:   PT Evaluation $PT Eval High  Complexity: 1 High          Kittie Plater, PT, DPT Acute Rehabilitation Services Pager #: [phone] Office #: [phone]   Berline Lopes 01/16/2021, 1:27 PM

## 2021-01-16 NOTE — Progress Notes (Signed)
Notified of decreased UOP. Minimal urine on bladder scanStat BMP sent, increase in creatinine and BMP. Discussed with Dr. Augustin Coupe with Renal and will plan to recheck BMP in AM and determine indication for RRT at that time. Discussed CVC placement tonight in anticipation and recs to hold off for now.   Jesusita Oka, MD General and [facility]

## 2021-01-16 NOTE — Progress Notes (Signed)
Trauma/Critical Care Follow Up Note  Subjective:    Overnight Issues:   Objective:  Vital signs for last 24 hours: Temp:  [98 F (36.7 C)-98.3 F (36.8 C)] 98 F (36.7 C) (08/31 1200) Pulse Rate:  [37-130] 124 (08/31 1500) Resp:  [18-29] 22 (08/31 1500) BP: (117-175)/(57-146) 175/146 (08/31 1400) SpO2:  [92 %-100 %] 96 % (08/31 1510) Arterial Line BP: (78-171)/(46-74) 151/61 (08/31 1500) FiO2 (%):  [50 %-60 %] 50 % (08/31 1510)  Hemodynamic parameters for last 24 hours:    Intake/Output from previous day: 08/30 0701 - 08/31 0700 In: 4249.7 [I.V.:1669.2; NG/GT:1890.8; IV Piggyback:689.7] Out: 3536 [Urine:4100; Stool:375]  Intake/Output this shift: Total I/O In: 2015.4 [I.V.:639; NG/GT:1330.4; IV Piggyback:46] Out: 1000 [Urine:1000]  Vent settings for last 24 hours: Vent Mode: PSV;CPAP FiO2 (%):  [50 %-60 %] 50 % Set Rate:  [20 bmp] 20 bmp Vt Set:  [650 mL] 650 mL PEEP:  [5 cmH20] 5 cmH20 Pressure Support:  [5 cmH20] 5 cmH20 Plateau Pressure:  [16 cmH20-29 cmH20] 16 cmH20  Physical Exam:  Gen: comfortable, no distress Neuro: non-focal exam HEENT: PERRL Neck: supple CV: RRR Pulm: unlabored breathing Abd: soft, NT, distended GU: clear yellow urine Extr: wwp, trace edema   Results for orders placed or performed during the hospital encounter of 12/28/20 (from the past 24 hour(s))  Glucose, capillary     Status: Abnormal   Collection Time: 01/15/21  7:45 PM  Result Value Ref Range   Glucose-Capillary 200 (H) 70 - 99 mg/dL  Glucose, capillary     Status: Abnormal   Collection Time: 01/15/21 11:34 PM  Result Value Ref Range   Glucose-Capillary 260 (H) 70 - 99 mg/dL  Glucose, capillary     Status: Abnormal   Collection Time: 01/16/21  3:49 AM  Result Value Ref Range   Glucose-Capillary 283 (H) 70 - 99 mg/dL  CBC     Status: Abnormal   Collection Time: 01/16/21  5:36 AM  Result Value Ref Range   WBC 9.6 4.0 - 10.5 K/uL   RBC 2.98 (L) 4.22 - 5.81 MIL/uL    Hemoglobin 7.7 (L) 13.0 - 17.0 g/dL   HCT 25.4 (L) 39.0 - 52.0 %   MCV 85.2 80.0 - 100.0 fL   MCH 25.8 (L) 26.0 - 34.0 pg   MCHC 30.3 30.0 - 36.0 g/dL   RDW 18.1 (H) 11.5 - 15.5 %   Platelets 542 (H) 150 - 400 K/uL   nRBC 0.0 0.0 - 0.2 %  APTT     Status: Abnormal   Collection Time: 01/16/21  5:36 AM  Result Value Ref Range   aPTT 77 (H) 24 - 36 seconds  Triglycerides     Status: None   Collection Time: 01/16/21  5:36 AM  Result Value Ref Range   Triglycerides 147 <150 mg/dL  Renal function panel     Status: Abnormal   Collection Time: 01/16/21  5:36 AM  Result Value Ref Range   Sodium 146 (H) 135 - 145 mmol/L   Potassium 3.0 (L) 3.5 - 5.1 mmol/L   Chloride 116 (H) 98 - 111 mmol/L   CO2 17 (L) 22 - 32 mmol/L   Glucose, Bld 311 (H) 70 - 99 mg/dL   BUN 128 (H) 8 - 23 mg/dL   Creatinine, Ser 4.80 (H) 0.61 - 1.24 mg/dL   Calcium 8.1 (L) 8.9 - 10.3 mg/dL   Phosphorus 6.9 (H) 2.5 - 4.6 mg/dL   Albumin 1.8 (L) 3.5 -  5.0 g/dL   GFR, Estimated 12 (L) >60 mL/min   Anion gap 13 5 - 15  Glucose, capillary     Status: Abnormal   Collection Time: 01/16/21  8:12 AM  Result Value Ref Range   Glucose-Capillary 257 (H) 70 - 99 mg/dL  Glucose, capillary     Status: Abnormal   Collection Time: 01/16/21 12:24 PM  Result Value Ref Range   Glucose-Capillary 259 (H) 70 - 99 mg/dL  APTT     Status: Abnormal   Collection Time: 01/16/21  1:09 PM  Result Value Ref Range   aPTT 72 (H) 24 - 36 seconds    Assessment & Plan: The plan of care was discussed with the bedside nurse for the day, Jen L, who is in agreement with this plan and no additional concerns were raised.   Present on Admission: **None**    LOS: 19 days   Additional comments:I reviewed the patient's new clinical lab test results.   and I reviewed the patients new imaging test results.    Fall down stairs 8/12   VDRF - guaifenisen, wean, S/P trach 8/29 by Dr. Bobbye Morton ID - resp CX now with pseud and enterococcus -  maxipime/ampicillin, day 15/7, stop today. CT A/P with ascending colitis and distention. Stool studies and C. dif are all negative TBI/SAH/SDH - NSGY c/s, Dr. Annette Stable. Significant frontal lobe injuries. Keppra x7d for sz ppx. Occipital bone fx - NSGY c/s, Dr. Annette Stable Temporal bone fx extending into middle ear - ENT c/s, Dr. Constance Holster Right TM Rupture - ENT c/s, Dr. Constance Holster AFRVR - amio off, appreciate Cardiology, back on cardene, start PO metop and monitor HR in light of prior bradycardia. ABL anemia  Bilateral pulmonary embolism - bival AKI - CRT down to 4.8, diuresing well per Renal recs - 4.1L/24h, BUN stably elevated, PO4 downtrending. Expect renal recovery, no plans for CRRT/HD at this point. Cont to trend. Decrease intake to allow improved overall diuresis--d/c cont gtts, add PO metop to try to come off cardene Hx DM2 - resistant SSI, glargine 43u BID restarted Hx HTN - PRN meds FEN - NPO, resume TF VTE - SCDs, bival gtt Foley - removed Dispo - ICU, wean, PT/OT/SLP  Clinical update provided to wife at bedside.   Critical Care Total Time: 45 minutes  Jesusita Oka, MD Trauma & General Surgery Please use [email or web address] to contact on call provider  01/16/2021  *Care during the described time interval was provided by me. I have reviewed this patient's available data, including medical history, events of note, physical examination and test results as part of my evaluation.

## 2021-01-16 NOTE — Progress Notes (Signed)
Cardiology Progress Note  Patient ID: Angel [name] MRN: [id number] DOB: 12/31/1950 Date of Encounter: 01/16/2021  Primary Cardiologist: Werner Lean, MD  Subjective   Chief Complaint: None.  HPI: A. fib with RVR this morning.  Remains grossly volume overloaded.  Creatinine not improving as rapidly as would like.  Still remains severely acidotic based on BMP.  ROS:  All other ROS reviewed and negative. Pertinent positives noted in the HPI.     Inpatient Medications  Scheduled Meds:  acetaminophen  1,000 mg Per Tube Q6H   bethanechol  25 mg Per Tube TID   chlorhexidine gluconate (MEDLINE KIT)  15 mL Mouth Rinse BID   Chlorhexidine Gluconate Cloth  6 each Topical Q0600   dextrose  12.5 g Intravenous Once   docusate  100 mg Per Tube BID   fentaNYL (SUBLIMAZE) injection  25 mcg Intravenous Once   free water  200 mL Per Tube Q4H   furosemide  80 mg Intravenous Q8H   guaiFENesin  10 mL Per Tube Q4H   insulin aspart  0-20 Units Subcutaneous Q4H   insulin aspart  10 Units Subcutaneous Q4H   insulin glargine-yfgn  43 Units Subcutaneous BID   mouth rinse  15 mL Mouth Rinse 10 times per day   methocarbamol  1,000 mg Per Tube Q8H   metoprolol tartrate  25 mg Oral BID   pantoprazole sodium  40 mg Per Tube Daily   polyethylene glycol  17 g Per Tube Daily   potassium chloride  40 mEq Per Tube Q4H   QUEtiapine  50 mg Per Tube BID   senna  1 tablet Per Tube Daily   sodium chloride flush  10-40 mL Intracatheter Q12H   Continuous Infusions:  sodium chloride     bivalirudin (ANGIOMAX) infusion 0.5 mg/mL (Non-ACS indications) 0.03 mg/kg/hr (01/16/21 1000)   feeding supplement (PIVOT 1.5 CAL) 1,000 mL (01/15/21 0823)   fentaNYL infusion INTRAVENOUS 150 mcg/hr (01/16/21 1000)   niCARDipine 10 mg/hr (01/16/21 1000)   propofol (DIPRIVAN) infusion 20 mcg/kg/min (01/16/21 1000)   PRN Meds: Place/Maintain arterial line **AND** sodium chloride, fentaNYL, fentaNYL (SUBLIMAZE) injection,  hydrALAZINE, ondansetron **OR** ondansetron (ZOFRAN) IV, oxyCODONE, sodium chloride flush, sodium phosphate   Vital Signs   Vitals:   01/16/21 0754 01/16/21 0800 01/16/21 0900 01/16/21 1000  BP: (!) 158/57 (!) 145/119 136/74 126/62  Pulse:  (!) 123 98 70  Resp:  19 (!) 22 20  Temp:  98.1 F (36.7 C)    TempSrc:  Axillary    SpO2: 92% 93% 93% 93%  Weight:      Height:        Intake/Output Summary (Last 24 hours) at 01/16/2021 1017 Last data filed at 01/16/2021 1000 Gross per 24 hour  Intake 4953.73 ml  Output 4450 ml  Net 503.73 ml   Last 3 Weights 01/13/2021 01/13/2021 01/09/2021  Weight (lbs) 306 lb 10.6 oz 305 lb 8.9 oz 288 lb 12.8 oz  Weight (kg) 139.1 kg 138.6 kg 131 kg      Telemetry  Overnight telemetry shows sinus rhythm heart rate 60-70 beeper minute range, intermittent atrial fibrillation this morning, which I personally reviewed.   Physical Exam   Vitals:   01/16/21 0754 01/16/21 0800 01/16/21 0900 01/16/21 1000  BP: (!) 158/57 (!) 145/119 136/74 126/62  Pulse:  (!) 123 98 70  Resp:  19 (!) 22 20  Temp:  98.1 F (36.7 C)    TempSrc:  Axillary    SpO2: 92%  93% 93% 93%  Weight:      Height:        Intake/Output Summary (Last 24 hours) at 01/16/2021 1017 Last data filed at 01/16/2021 1000 Gross per 24 hour  Intake 4953.73 ml  Output 4450 ml  Net 503.73 ml    Last 3 Weights 01/13/2021 01/13/2021 01/09/2021  Weight (lbs) 306 lb 10.6 oz 305 lb 8.9 oz 288 lb 12.8 oz  Weight (kg) 139.1 kg 138.6 kg 131 kg    Body mass index is 39.37 kg/m.   General: Ill-appearing Head: Atraumatic, normal size  Eyes: PEERLA, EOMI  Neck: Supple, elevated JVD Endocrine: No thryomegaly Cardiac: Normal S1, S2; RRR; no murmurs, rubs, or gallops Lungs: Diminished breath sounds bilaterally Abd: Soft, nontender, no hepatomegaly  Ext: 2+ pitting edema up to the thighs, upper extremities are swollen as well Musculoskeletal: No deformities Skin: Warm and dry, no rashes   Neuro:  Intubated on the vent, not following commands  Labs  High Sensitivity Troponin:   Recent Labs  Lab 01/10/21 1346 01/10/21 1518  TROPONINIHS 52* 56*     Cardiac EnzymesNo results for input(s): TROPONINI in the last 168 hours. No results for input(s): TROPIPOC in the last 168 hours.  Chemistry Recent Labs  Lab 01/10/21 1346 01/11/21 0603 01/14/21 0500 01/15/21 0201 01/16/21 0536  NA 141   < > 147* 146* 146*  K 3.5   < > 3.1* 3.2* 3.0*  CL 111   < > 118* 117* 116*  CO2 20*   < > 16* 15* 17*  GLUCOSE 317*   < > 69* 150* 311*  BUN 94*   < > 131* 127* 128*  CREATININE 2.77*   < > 5.04* 4.90* 4.80*  CALCIUM 7.9*   < > 8.0* 8.2* 8.1*  PROT 5.7*  --   --   --   --   ALBUMIN 1.7*  --   --  1.7* 1.8*  AST 30  --   --   --   --   ALT 34  --   --   --   --   ALKPHOS 53  --   --   --   --   BILITOT 0.4  --   --   --   --   GFRNONAA 24*   < > 12* 12* 12*  ANIONGAP 10   < > 13 14 13    < > = values in this interval not displayed.    Hematology Recent Labs  Lab 01/14/21 0500 01/15/21 0201 01/16/21 0536  WBC 9.7 10.1 9.6  RBC 2.99* 3.03* 2.98*  HGB 7.7* 7.9* 7.7*  HCT 25.0* 25.9* 25.4*  MCV 83.6 85.5 85.2  MCH 25.8* 26.1 25.8*  MCHC 30.8 30.5 30.3  RDW 17.6* 17.9* 18.1*  PLT 518* 572* 542*   BNPNo results for input(s): BNP, PROBNP in the last 168 hours.  DDimer No results for input(s): DDIMER in the last 168 hours.   Radiology  DG CHEST PORT 1 VIEW  Result Date: 01/16/2021 CLINICAL DATA:  Pulmonary edema EXAM: PORTABLE CHEST 1 VIEW COMPARISON:  01/15/2021 FINDINGS: Tracheostomy tube in gastric catheter are again noted and stable. Cardiac shadow is mildly enlarged but stable. Right-sided PICC line is again seen and unchanged in position. Lungs are well aerated bilaterally. Right-sided pleural effusion is noted slightly decreased from the prior exam although this may be positional in nature. No focal confluent infiltrate is noted. Mild vascular congestion is again seen.  IMPRESSION: Stable vascular  congestion. Right-sided effusion which appears slightly smaller than that noted on the prior exam. Tubes and lines as described. Electronically Signed   By: Inez Catalina M.D.   On: 01/16/2021 08:24   DG Chest Port 1 View  Result Date: 01/15/2021 CLINICAL DATA:  Respiratory failure EXAM: PORTABLE CHEST 1 VIEW COMPARISON:  01/14/2021 FINDINGS: Cardiac shadow remains enlarged. Tracheostomy tube and gastric catheter are again seen and stable. Right-sided PICC line is noted in the right atrium stable in appearance. Right-sided pleural effusion is noted similar to that seen on the prior exam. Persistent vascular congestion is noted. No bony abnormality is noted. IMPRESSION: Vascular congestion and right-sided effusion stable from the prior exam. Electronically Signed   By: Inez Catalina M.D.   On: 01/15/2021 08:55   DG Chest Port 1 View  Result Date: 01/14/2021 CLINICAL DATA:  Respiratory failure. EXAM: PORTABLE CHEST 1 VIEW COMPARISON:  Chest radiograph dated 01/14/2021. FINDINGS: Tracheostomy remains above the carina and enteric tube with tip in the proximal stomach. The enteric tube has been pulled back. Right-sided PICC with tip close to the cavoatrial junction. Cardiomegaly with vascular congestion and edema. Small bilateral pleural effusions and bibasilar atelectasis or infiltrate. No pneumothorax. No acute osseous pathology. IMPRESSION: Cardiomegaly with findings of CHF similar or slightly worsened since the prior radiograph. Superimposed pneumonia is not excluded clinical correlation is recommended. Electronically Signed   By: Anner Crete M.D.   On: 01/14/2021 19:11    Cardiac Studies  TTE 01/10/2021  1. Left ventricular ejection fraction, by estimation, is 60 to 65%. The  left ventricle has normal function. The left ventricle has no regional  wall motion abnormalities. There is mild left ventricular hypertrophy.  Left ventricular diastolic parameters  were normal.    2. Right ventricular systolic function is mildly reduced. The right  ventricular size is not well visualized.   3. The mitral valve is normal in structure. Trivial mitral valve  regurgitation. No evidence of mitral stenosis.   4. The aortic valve is tricuspid. Aortic valve regurgitation is not  visualized. Mild aortic valve sclerosis is present, with no evidence of  aortic valve stenosis.   Patient Profile  Angel [name] is a 70 y.o. male with hypertension, diabetes, hyperlipidemia who was admitted on 12/28/2020 with fall and subarachnoid hemorrhage.  Course has been very complicated with multifocal pneumonia/septic shock diagnosis on 01/02/2021.  He did develop atrial fibrillation on 01/04/2021 likely secondary to pneumonia and septic shock..  Course is also been complicated by bilateral pulmonary emboli developed on 01/09/2021.  Cardiology initially had signed off but they were reconsulted in the setting of bradycardia/afib.   Assessment & Plan   #Paroxysmal Afib -Developed A. fib on 01/04/2021.  Has had intermittent recurrent of this arrhythmia.  This is driven by critical illness, multiorgan system failure including acute kidney injury with severe acidosis, multifocal pneumonia, bilateral pulmonary emboli. -Had recurrence this morning. -Has had intermittent bradycardia as well.  I suspect this is all driven by acidosis.  Echo with normal LV function. -We will rechallenge him on metoprolol tartrate 25 twice daily.  Again he will undoubtably develop atrial fibrillation.  Would aggressively address his volume status as well as acidosis.  I think he just needs dialysis.  2.  Sinus bradycardia -He does have A. fib with intermittent sinus bradycardia.  Could be on his way developing tachybradycardia syndrome.  Again this is very difficult to diagnose in the setting of acute kidney injury, volume overload, metabolic acidosis.  I  would like to see these derangements improved.  I suspect he needs CRRT.  I  will defer this to nephrology. -We will rechallenge for A. fib with metoprolol as above.  Holding amiodarone.  3.  Fall with subarachnoid hemorrhage -Per primary team  4.  Bilateral pulmonary emboli -On bivalirudin  5.  Multifocal pneumonia -Per primary team  For questions or updates, please contact [facility] Please consult [email or web address] for contact info under   Time Spent with Patient: I have spent a total of 35 minutes with patient reviewing hospital notes, telemetry, EKGs, labs and examining the patient as well as establishing an assessment and plan that was discussed with the patient.  > 50% of time was spent in direct patient care.    Signed, Addison Naegeli. Audie Box, MD, [place]  01/16/2021 10:17 AM

## 2021-01-16 NOTE — Progress Notes (Signed)
[facility] for bivalirudin Indication: atrial fibrillation, DVT, PE 8/23   No Known Allergies  Patient Measurements: Height: 6\' 2"  (188 cm) Weight: (!) 139.1 kg (306 lb 10.6 oz) IBW/kg (Calculated) : 82.2 Heparin Dosing Weight: 107kg  Vital Signs: Temp: 98.2 F (36.8 C) (08/31 1600) Temp Source: Axillary (08/31 1600) BP: 163/59 (08/31 1923) Pulse Rate: 73 (08/31 1923)  Labs: Recent Labs    01/14/21 0500 01/15/21 0201 01/16/21 0536 01/16/21 1309 01/16/21 1809  HGB 7.7* 7.9* 7.7*  --   --   HCT 25.0* 25.9* 25.4*  --   --   PLT 518* 572* 542*  --   --   APTT 66* 60* 77* 72* 68*  CREATININE 5.04* 4.90* 4.80*  --  5.09*     Estimated Creatinine Clearance: 20.1 mL/min (A) (by C-G formula based on SCr of 5.09 mg/dL (H)).   Assessment: 8 YOM presenting s/p fall with TBI/SAH and facial fx, in afib started on amiodarone and now cleared per trauma for full dose anticoagulation. 8/23 patient found to have small acute bilateral PE and age-indeterminate LUE DVT. Pharmacy consulted to dose bivalirudin per Trauma.  Given recent head bleed, will aim for middle of therapeutic range aptt and watch closely for signs and symptoms of bleeding.    aPTT still slightly supratherapeutic following 2nd rate decrease. Current rate is 0.025mg /kg/hr, SCr remains elevated. Will decrease slightly to 0.02mg /kg/hr.  Goal of Therapy:  Aptt goal ~50-65s per discussion with Trauma  Monitor platelets by anticoagulation protocol: Yes   Plan:  Decrease bivalirudin to 0.02 mg/kg/hr F/u 4h aPTT to confirm Monitor renal function, daily aPTT, CBC, s/s bleeding   Thank you for allowing pharmacy to be a part of this patient's care.  Ardyth Harps, PharmD Clinical Pharmacist

## 2021-01-16 NOTE — Procedures (Signed)
Cortrak  Person Inserting Tube:  Earland Reish E, RD Tube Type:  Cortrak - 43 inches Tube Size:  10 Tube Location:  Left nare Initial Placement:  Stomach Secured by: Bridle Technique Used to Measure Tube Placement:  Marking at nare/corner of mouth Cortrak Secured At:  70 cm Cortrak Tube Team Note:  Consult received to place a Cortrak feeding tube.   X-ray is required, abdominal x-ray has been ordered by the Cortrak team. Please confirm tube placement before using the Cortrak tube.   If the tube becomes dislodged please keep the tube and contact the Cortrak team at [email or web address] (password [email or web address]) for replacement.  If after hours and replacement cannot be delayed, place a NG tube and confirm placement with an abdominal x-ray.    Jamoni Hewes, MS, RD, LDN (she/her/hers) RD pager number and weekend/on-call pager number located in Amion.   

## 2021-01-16 NOTE — Progress Notes (Signed)
ANTICOAGULATION CONSULT NOTE  Pharmacy Consult for bivalirudin Indication: atrial fibrillation, DVT, PE 8/23   No Known Allergies  Patient Measurements: Height: 6\' 2"  (188 cm) Weight: (!) 139.1 kg (306 lb 10.6 oz) IBW/kg (Calculated) : 82.2 Heparin Dosing Weight: 107kg  Vital Signs: Temp: 98 F (36.7 C) (08/31 1200) Temp Source: Axillary (08/31 1200) BP: 161/93 (08/31 1300) Pulse Rate: 125 (08/31 1300)  Labs: Recent Labs    01/14/21 0500 01/15/21 0201 01/16/21 0536 01/16/21 1309  HGB 7.7* 7.9* 7.7*  --   HCT 25.0* 25.9* 25.4*  --   PLT 518* 572* 542*  --   APTT 66* 60* 77* 72*  CREATININE 5.04* 4.90* 4.80*  --      Estimated Creatinine Clearance: 21.3 mL/min (A) (by C-G formula based on SCr of 4.8 mg/dL (H)).   Assessment: 31 YOM presenting s/p fall with TBI/SAH and facial fx, in afib started on amiodarone and now cleared per trauma for full dose anticoagulation. 8/23 patient found to have small acute bilateral PE and age-indeterminate LUE DVT. Pharmacy consulted to dose bivalirudin per Trauma.  Given recent head bleed, will aim for middle of therapeutic range aptt and watch for signs and symptoms of bleeding closely.    aPTT remains slightly supratherapeutic s/p rate decrease to 0.03 mg/kg/hr, SCr remains elevated   Goal of Therapy:  Aptt goal ~50-65s per discussion with Trauma  Monitor platelets by anticoagulation protocol: Yes   Plan:  Decrease bivalirudin to 0.025 mg/kg/hr F/u 4h aPTT to confirm Monitor renal function, daily aPTT, CBC, s/s bleeding  Bertis Ruddy, PharmD Clinical Pharmacist Please check AMION for all [place] numbers 01/16/2021 1:53 PM

## 2021-01-16 NOTE — Progress Notes (Signed)
Inpatient Rehab Admissions Coordinator:   Per therapy recommendations pt was screened for CIR by Shann Medal, PT, DPT.  At this time, note still on the vent and limited to bed level eval.  Will follow for vent liberation and progressive mobility and rescreen later this week.    Shann Medal, PT, DPT Admissions Coordinator [phone] 01/16/21  4:01 PM

## 2021-01-16 NOTE — Evaluation (Signed)
Speech Language Pathology Evaluation Patient Details Name: Angel [name] MRN: [id number] DOB: May 16, 1951 Today's Date: 01/16/2021 Time: 9381-8299 SLP Time Calculation (min) (ACUTE ONLY): 26 min  Problem List:  Patient Active Problem List   Diagnosis Date Noted   SAH (subarachnoid hemorrhage) ([place]) 12/28/2020   Past Medical History:  Past Medical History:  Diagnosis Date   DM (diabetes mellitus) ([place])    HLD (hyperlipidemia)    Hypertension    Past Surgical History:  HPI: Angel [name] is a 70 y.o. 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 CT showed R temporal and parietal SAH, SAH anterior frontal lobes  bilaterally. Also sustained right occipital skull fracture, temporal bone fx, right TM rupture, bilateral PE. Intubated 8/12, trach'd 8/29. PMH: DM2, HTN   Assessment / Plan / Recommendation Clinical Impression  Pt seen in conjuction with PT/OT for evaluation of TBI with trach and full support ventilation in chair position.  He exhibited behaviors consistent with Rancho III (localized response). RN stopped sedation and pt partially and intermittently opened his eyes needing max tactile stimuli. In response to pain he appropriately grimaced. He was unable to track people/objects and no response to simple commands or yes/no questions at this time. Focused attention not observed. SLP will continue to treat diagnositcally and modify treatment plan/goals in response to his abilities. Wife educated by therapists re: information in brain injury booklet, strategies to communciate with pt and treatment plan. Will complete PMV either on vent or trach collar when appropriate.    SLP Assessment  SLP Recommendation/Assessment: Patient needs continued Speech Lanaguage Pathology Services SLP Visit Diagnosis: Cognitive communication deficit (R41.841)    Follow Up Recommendations  Inpatient Rehab    Frequency and Duration min 2x/week  2 weeks      SLP Evaluation Cognition  Overall  Cognitive Status: Impaired/Different from baseline Arousal/Alertness: Lethargic Orientation Level:  (no response to y/n) Attention: Focused Focused Attention: Impaired Focused Attention Impairment: Verbal basic Awareness: Impaired Awareness Impairment: Emergent impairment Problem Solving:  (will continue to assess) Safety/Judgment: Impaired Rancho [employer] Scales of Cognitive Functioning: Localized response       Comprehension  Auditory Comprehension Overall Auditory Comprehension:  (no response to commands) Visual Recognition/Discrimination Discrimination: Not tested Reading Comprehension Reading Status: Not tested    Expression Expression Primary Mode of Expression:  (no attempt to gesture, mouth) Verbal Expression Overall Verbal Expression:  (will cont to assess) Written Expression Dominant Hand: Right Written Expression: Not tested   Oral / Motor  Oral Motor/Sensory Function Overall Oral Motor/Sensory Function:  (not following commands, cont to assess) Motor Speech Overall Motor Speech:  (n/a)   GO                    Angel [name] 01/16/2021, 1:09 PM   Angel [name].Ed Risk analyst [phone] Office [phone]'

## 2021-01-16 NOTE — Progress Notes (Signed)
Pt has not voided since prior to lasix/albumin around 1500. Bladder scan revealed only 117mL in bladder. Contacted Dr. Bobbye Morton who provided VO for stat BMP.  Lab drawn and sent.

## 2021-01-16 NOTE — Evaluation (Addendum)
Occupational Therapy Evaluation Patient Details Name: Angel [name] MRN: [id number] DOB: 11/07/1950 Today's Date: 01/16/2021    History of Present Illness Angel [name] is a 70 y.o. 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 CT showed R temporal and parietal SAH, SAH anterior frontal lobes  bilaterally. Also sustained right occipital skull fracture, temporal bone fx, right TM rupture, bilateral PE. Intubated 8/12, trach'd 8/29. PMH: DM2, HTN   Clinical Impression   PT admitted with [facility] TBI (18 day admission). Pt currently with functional limitiations due to the deficits listed below (see OT problem list). Pt currently requires total +2 total (A) to elevate trunk from bedsurface with increased arousal. PT demonstrates generalized response Rancho III. Pt noted to have edema throughout extremities and elevated bil UE provided. Pt not splinted at this time due to risk for skin integrity so recommendation of wash cloth for functional hand positioning.  Pt will benefit from skilled OT to increase their independence and safety with adls and balance to allow discharge CIR.     Follow Up Recommendations  CIR    Equipment Recommendations  3 in 1 bedside commode;Wheelchair (measurements OT);Wheelchair cushion (measurements OT);Hospital bed (TBA) vent full support FIO2 50% peep 5   Recommendations for Other Services Rehab consult     Precautions / Restrictions Precautions Precautions: Fall Precaution Comments: trach/vent, watch HR and BP, very edematous, Restrictions Weight Bearing Restrictions: No      Mobility Bed Mobility Overal bed mobility: Needs Assistance Bed Mobility:  (used egress in the bed to position pt in upright chair position as pt non-response even with sedation lifted, very edematous and not actively participating)           General bed mobility comments: pt tolerated sitting in egress position for 12 min, pt didn't open eyes until PT brough trunk forward off the  back of the bed/chair position, pt then maintained eyes opened t/o rest of session, no active participation except with trace effort to stick tongue out to verbal cues of SLP    Transfers                 General transfer comment: unsafe at this time    Balance Overall balance assessment: Needs assistance Sitting-balance support: Feet unsupported;No upper extremity supported Sitting balance-Leahy Scale: Zero Sitting balance - Comments: dependent on egress in bed for support                                   ADL either performed or assessed with clinical judgement   ADL Overall ADL's : Needs assistance/impaired                                       General ADL Comments: total (A) for all     Vision   Additional Comments: difficult to fully assess. does open eyes in static sitting. does not track. does not blink to threat     Perception     Praxis      Pertinent Vitals/Pain Pain Assessment: Faces Faces Pain Scale: Hurts a little bit Pain Location: noxious stimuli Pain Descriptors / Indicators: Grimacing Pain Intervention(s): Monitored during session;Repositioned     Hand Dominance Right   Extremity/Trunk Assessment Upper Extremity Assessment Upper Extremity Assessment: RUE deficits/detail;LUE deficits/detail RUE Deficits / Details: edema noted throughout UE, no active  movement noted. responses to painful stimuli. PICC line present and noted to have blisters RUE Sensation: decreased light touch;decreased proprioception RUE Coordination: decreased fine motor;decreased gross motor LUE Deficits / Details: edema noted throughout. no activation. no response to pain LUE Sensation: decreased light touch;decreased proprioception LUE Coordination: decreased fine motor;decreased gross motor   Lower Extremity Assessment Lower Extremity Assessment: Difficult to assess due to impaired cognition   Cervical / Trunk Assessment Cervical / Trunk  Assessment: Other exceptions (rounded shoulders noted due to body habitus and edema)   Communication Communication Communication: Tracheostomy   Cognition Arousal/Alertness: Lethargic Behavior During Therapy: Flat affect Overall Cognitive Status: Impaired/Different from baseline Area of Impairment: Rancho level               Rancho Levels of Cognitive Functioning Rancho Los Amigos Scales of Cognitive Functioning: Localized response               General Comments: pt not following commands, pt did open eyes once placed in chair position, no volitional or purposeful movement, all sedative medicine turned off prior to eval Rancho III localized response   General Comments  HR 66-130s RN present throughout.    Exercises Exercises: Other exercises Other Exercises Other Exercises: PROM bil UE digits and wrist elbow shoulder flexion Other Exercises: family given TBI book and education of TBI team   Shoulder Instructions      Home Living Family/patient expects to be discharged to:: Private residence Living Arrangements: Spouse/significant other Available Help at Discharge: Family;Available 24 hours/day Type of Home: House Home Access: Level entry     Home Layout: One level     Bathroom Shower/Tub: Occupational psychologist: Standard     Home Equipment: None   Additional Comments: does have basement he would go down 2x/day  Lives With: Spouse    Prior Functioning/Environment Level of Independence: Independent        Comments: no AD, watched their grandchildren        OT Problem List: Decreased strength;Decreased range of motion;Decreased activity tolerance;Impaired balance (sitting and/or standing);Decreased cognition;Decreased safety awareness;Decreased knowledge of use of DME or AE;Decreased knowledge of precautions;Cardiopulmonary status limiting activity;Obesity;Impaired UE functional use;Increased edema;Impaired sensation;Decreased  coordination;Impaired vision/perception      OT Treatment/Interventions: Self-care/ADL training;Neuromuscular education;Therapeutic exercise;Energy conservation;DME and/or AE instruction;Manual therapy;Modalities;Therapeutic activities;Cognitive remediation/compensation;Visual/perceptual remediation/compensation;Patient/family education;Balance training    OT Goals(Current goals can be found in the care plan section) Acute Rehab OT Goals Patient Stated Goal: unable to state OT Goal Formulation: Patient unable to participate in goal setting Time For Goal Achievement: 01/30/21 Potential to Achieve Goals: Good  OT Frequency: Min 3X/week   Barriers to D/C:            Co-evaluation PT/OT/SLP Co-Evaluation/Treatment: Yes Reason for Co-Treatment: Complexity of the patient's impairments (multi-system involvement);Necessary to address cognition/behavior during functional activity;For patient/therapist safety;To address functional/ADL transfers PT goals addressed during session: Mobility/safety with mobility OT goals addressed during session: ADL's and self-care;Proper use of Adaptive equipment and DME;Strengthening/ROM      AM-PAC OT "6 Clicks" Daily Activity     Outcome Measure Help from another person eating meals?: Total Help from another person taking care of personal grooming?: Total Help from another person toileting, which includes using toliet, bedpan, or urinal?: Total Help from another person bathing (including washing, rinsing, drying)?: Total Help from another person to put on and taking off regular upper body clothing?: Total Help from another person to put on and taking off regular lower body clothing?:  Total 6 Click Score: 6   End of Session Equipment Utilized During Treatment: Oxygen Nurse Communication: Mobility status;Precautions  Activity Tolerance: Patient tolerated treatment well Patient left: in bed;with call bell/phone within reach;with bed alarm set;with  nursing/sitter in room;with family/visitor present  OT Visit Diagnosis: Unsteadiness on feet (R26.81);Muscle weakness (generalized) (M62.81)                Time: 7711-6579 OT Time Calculation (min): 37 min Charges:  OT General Charges $OT Visit: 1 Visit OT Evaluation $OT Eval High Complexity: 1 High   Brynn, OTR/L  Acute Rehabilitation Services Pager: [phone] Office: [phone] .   Jeri Modena 01/16/2021, 2:59 PM

## 2021-01-16 NOTE — Progress Notes (Signed)
ANTICOAGULATION CONSULT NOTE  Pharmacy Consult for bivalirudin Indication: atrial fibrillation, DVT, PE 8/23   No Known Allergies  Patient Measurements: Height: 6\' 2"  (188 cm) Weight: (!) 139.1 kg (306 lb 10.6 oz) IBW/kg (Calculated) : 82.2 Heparin Dosing Weight: 107kg  Vital Signs: Temp: 98.1 F (36.7 C) (08/31 0400) Temp Source: Axillary (08/31 0000) BP: 132/69 (08/31 0700) Pulse Rate: 106 (08/31 0700)  Labs: Recent Labs    01/14/21 0500 01/15/21 0201 01/16/21 0536  HGB 7.7* 7.9* 7.7*  HCT 25.0* 25.9* 25.4*  PLT 518* 572* 542*  APTT 66* 60* 77*  CREATININE 5.04* 4.90* 4.80*     Estimated Creatinine Clearance: 21.3 mL/min (A) (by C-G formula based on SCr of 4.8 mg/dL (H)).   Assessment: 67 YOM presenting s/p fall with TBI/SAH and facial fx, in afib started on amiodarone and now cleared per trauma for full dose anticoagulation. 8/23 patient found to have small acute bilateral PE and age-indeterminate LUE DVT. Pharmacy consulted to dose bivalirudin per Trauma.  Given recent head bleed, will aim for middle of therapeutic range aptt and watch for signs and symptoms of bleeding closely.    aPTT supratherapeutic this AM at 77 seconds with lower aPTT goals  Goal of Therapy:  Aptt goal ~50-65s per discussion with Trauma  Monitor platelets by anticoagulation protocol: Yes   Plan:  Decrease bivalirudin to 0.03 mg/kg/hr F/u 4h aPTT to confirm Monitor renal function, daily aPTT, CBC, s/s bleeding  Bertis Ruddy, PharmD Clinical Pharmacist Please check AMION for all [place] numbers 01/16/2021 7:13 AM

## 2021-01-17 ENCOUNTER — Inpatient Hospital Stay (HOSPITAL_COMMUNITY): Payer: PPO

## 2021-01-17 DIAGNOSIS — I480 Paroxysmal atrial fibrillation: Secondary | ICD-10-CM | POA: Diagnosis not present

## 2021-01-17 DIAGNOSIS — R918 Other nonspecific abnormal finding of lung field: Secondary | ICD-10-CM | POA: Diagnosis not present

## 2021-01-17 DIAGNOSIS — I4819 Other persistent atrial fibrillation: Secondary | ICD-10-CM | POA: Diagnosis not present

## 2021-01-17 DIAGNOSIS — R001 Bradycardia, unspecified: Secondary | ICD-10-CM | POA: Diagnosis not present

## 2021-01-17 DIAGNOSIS — I517 Cardiomegaly: Secondary | ICD-10-CM | POA: Diagnosis not present

## 2021-01-17 LAB — RENAL FUNCTION PANEL
Albumin: 1.9 g/dL — ABNORMAL LOW (ref 3.5–5.0)
Albumin: 1.9 g/dL — ABNORMAL LOW (ref 3.5–5.0)
Anion gap: 14 (ref 5–15)
Anion gap: 14 (ref 5–15)
BUN: 143 mg/dL — ABNORMAL HIGH (ref 8–23)
BUN: 149 mg/dL — ABNORMAL HIGH (ref 8–23)
CO2: 16 mmol/L — ABNORMAL LOW (ref 22–32)
CO2: 17 mmol/L — ABNORMAL LOW (ref 22–32)
Calcium: 8.2 mg/dL — ABNORMAL LOW (ref 8.9–10.3)
Calcium: 8 mg/dL — ABNORMAL LOW (ref 8.9–10.3)
Chloride: 115 mmol/L — ABNORMAL HIGH (ref 98–111)
Chloride: 116 mmol/L — ABNORMAL HIGH (ref 98–111)
Creatinine, Ser: 5.4 mg/dL — ABNORMAL HIGH (ref 0.61–1.24)
Creatinine, Ser: 5.5 mg/dL — ABNORMAL HIGH (ref 0.61–1.24)
GFR, Estimated: 11 mL/min — ABNORMAL LOW (ref >60)
GFR, Estimated: 10 mL/min — ABNORMAL LOW (ref >60)
Glucose, Bld: 211 mg/dL — ABNORMAL HIGH (ref 70–99)
Glucose, Bld: 156 mg/dL — ABNORMAL HIGH (ref 70–99)
Phosphorus: 6.5 mg/dL — ABNORMAL HIGH (ref 2.5–4.6)
Phosphorus: 7.2 mg/dL — ABNORMAL HIGH (ref 2.5–4.6)
Potassium: 3.7 mmol/L (ref 3.5–5.1)
Potassium: 4 mmol/L (ref 3.5–5.1)
Sodium: 145 mmol/L (ref 135–145)
Sodium: 147 mmol/L — ABNORMAL HIGH (ref 135–145)

## 2021-01-17 LAB — GLUCOSE, CAPILLARY
Glucose-Capillary: 120 mg/dL — ABNORMAL HIGH (ref 70–99)
Glucose-Capillary: 152 mg/dL — ABNORMAL HIGH (ref 70–99)
Glucose-Capillary: 156 mg/dL — ABNORMAL HIGH (ref 70–99)
Glucose-Capillary: 157 mg/dL — ABNORMAL HIGH (ref 70–99)
Glucose-Capillary: 195 mg/dL — ABNORMAL HIGH (ref 70–99)
Glucose-Capillary: 223 mg/dL — ABNORMAL HIGH (ref 70–99)

## 2021-01-17 LAB — CBC
HCT: 25 % — ABNORMAL LOW (ref 39.0–52.0)
Hemoglobin: 7.7 g/dL — ABNORMAL LOW (ref 13.0–17.0)
MCH: 25.8 pg — ABNORMAL LOW (ref 26.0–34.0)
MCHC: 30.8 g/dL (ref 30.0–36.0)
MCV: 83.6 fL (ref 80.0–100.0)
Platelets: 591 10*3/uL — ABNORMAL HIGH (ref 150–400)
RBC: 2.99 MIL/uL — ABNORMAL LOW (ref 4.22–5.81)
RDW: 18.6 % — ABNORMAL HIGH (ref 11.5–15.5)
WBC: 10.3 10*3/uL (ref 4.0–10.5)
nRBC: 0 % (ref 0.0–0.2)

## 2021-01-17 LAB — IMMUNOFIXATION ELECTROPHORESIS
IgA: 190 mg/dL (ref 61–437)
IgG (Immunoglobin G), Serum: 583 mg/dL — ABNORMAL LOW (ref 603–1613)
IgM (Immunoglobulin M), Srm: 47 mg/dL (ref 20–172)
Total Protein ELP: 2.7 g/dL — ABNORMAL LOW (ref 6.0–8.5)

## 2021-01-17 LAB — PHOSPHORUS: Phosphorus: 6.7 mg/dL — ABNORMAL HIGH (ref 2.5–4.6)

## 2021-01-17 LAB — APTT
aPTT: 66 s — ABNORMAL HIGH (ref 24–36)
aPTT: 58 s — ABNORMAL HIGH (ref 24–36)
aPTT: 51 s — ABNORMAL HIGH (ref 24–36)

## 2021-01-17 MED ORDER — PRISMASOL BGK 4/2.5 32-4-2.5 MEQ/L REPLACEMENT SOLN
Status: DC
  Filled 2021-01-17 (×13): qty 5000

## 2021-01-17 MED ORDER — PROSOURCE TF PO LIQD
90.0000 mL | Freq: Two times a day (BID) | ORAL | Status: DC
  Administered 2021-01-17 – 2021-01-30 (×27): 90 mL
  Filled 2021-01-17 (×27): qty 90

## 2021-01-17 MED ORDER — QUETIAPINE FUMARATE 100 MG PO TABS
100.0000 mg | ORAL_TABLET | Freq: Two times a day (BID) | ORAL | Status: DC
  Administered 2021-01-17 – 2021-01-30 (×27): 100 mg
  Filled 2021-01-17 (×27): qty 1

## 2021-01-17 MED ORDER — METOPROLOL TARTRATE 50 MG PO TABS
50.0000 mg | ORAL_TABLET | Freq: Four times a day (QID) | ORAL | Status: DC
  Administered 2021-01-17 – 2021-01-18 (×5): 50 mg
  Filled 2021-01-17 (×5): qty 1

## 2021-01-17 MED ORDER — PRISMASOL BGK 4/2.5 32-4-2.5 MEQ/L EC SOLN
Status: DC
  Filled 2021-01-17 (×62): qty 5000

## 2021-01-17 MED ORDER — OXYCODONE HCL 5 MG PO TABS
10.0000 mg | ORAL_TABLET | ORAL | Status: DC | PRN
  Administered 2021-01-17 – 2021-01-19 (×10): 15 mg
  Administered 2021-01-19 – 2021-01-20 (×2): 10 mg
  Administered 2021-01-20: 15 mg
  Administered 2021-01-20: 10 mg
  Administered 2021-01-20 – 2021-01-22 (×7): 15 mg
  Administered 2021-01-22 (×2): 10 mg
  Administered 2021-01-22 – 2021-01-23 (×4): 15 mg
  Administered 2021-01-24: 10 mg
  Administered 2021-01-24 – 2021-01-25 (×3): 15 mg
  Administered 2021-01-25 (×2): 10 mg
  Administered 2021-01-29: 15 mg
  Administered 2021-01-29: 10 mg
  Administered 2021-01-29: 15 mg
  Administered 2021-01-30 – 2021-02-01 (×3): 10 mg
  Filled 2021-01-17 (×4): qty 3
  Filled 2021-01-17 (×2): qty 2
  Filled 2021-01-17 (×2): qty 3
  Filled 2021-01-17: qty 2
  Filled 2021-01-17: qty 3
  Filled 2021-01-17 (×2): qty 2
  Filled 2021-01-17: qty 3
  Filled 2021-01-17: qty 2
  Filled 2021-01-17 (×2): qty 3
  Filled 2021-01-17: qty 2
  Filled 2021-01-17: qty 3
  Filled 2021-01-17 (×2): qty 2
  Filled 2021-01-17 (×4): qty 3
  Filled 2021-01-17 (×2): qty 2
  Filled 2021-01-17: qty 3
  Filled 2021-01-17: qty 2
  Filled 2021-01-17 (×2): qty 3
  Filled 2021-01-17: qty 2
  Filled 2021-01-17 (×8): qty 3
  Filled 2021-01-17: qty 2
  Filled 2021-01-17: qty 3

## 2021-01-17 MED ORDER — HYDROMORPHONE HCL 1 MG/ML IJ SOLN
0.5000 mg | INTRAMUSCULAR | Status: DC | PRN
  Administered 2021-01-17 – 2021-01-22 (×25): 1 mg via INTRAVENOUS
  Administered 2021-01-22: 0.5 mg via INTRAVENOUS
  Administered 2021-01-24 – 2021-01-30 (×6): 1 mg via INTRAVENOUS
  Filled 2021-01-17 (×33): qty 1

## 2021-01-17 MED ORDER — HYDRALAZINE HCL 25 MG PO TABS
25.0000 mg | ORAL_TABLET | Freq: Three times a day (TID) | ORAL | Status: DC
  Administered 2021-01-17 – 2021-01-18 (×4): 25 mg
  Filled 2021-01-17 (×4): qty 1

## 2021-01-17 MED ORDER — HEPARIN (PORCINE) 2000 UNITS/L FOR CRRT
INTRAVENOUS_CENTRAL | Status: DC | PRN
  Filled 2021-01-17: qty 1000

## 2021-01-17 MED ORDER — HEPARIN SODIUM (PORCINE) 1000 UNIT/ML DIALYSIS
1000.0000 [IU] | INTRAMUSCULAR | Status: DC | PRN
  Administered 2021-01-25: 3000 [IU] via INTRAVENOUS_CENTRAL
  Administered 2021-01-29: 2000 [IU] via INTRAVENOUS_CENTRAL
  Filled 2021-01-17: qty 4
  Filled 2021-01-17: qty 6
  Filled 2021-01-17 (×3): qty 3
  Filled 2021-01-17: qty 6
  Filled 2021-01-17: qty 5
  Filled 2021-01-17: qty 2
  Filled 2021-01-17 (×2): qty 6

## 2021-01-17 MED ORDER — PRISMASOL BGK 4/2.5 32-4-2.5 MEQ/L REPLACEMENT SOLN
Status: DC
  Filled 2021-01-17 (×15): qty 5000

## 2021-01-17 NOTE — Progress Notes (Signed)
Patient ID: Angel [name], male   DOB: [birth year], 70 y.o.   MRN: [id number] I spoke with Dr. Marval Regal and Dr. Audie Box. Plan CRRT today.  I called his wife, [name], and discussed his status and the plan of care. I also discussed starting CRRT as well as placement of a dialysis catheter. She agrees. Phone consent documented.  Georganna Skeans, MD, MPH, FACS Please use [email or web address] to contact on call provider

## 2021-01-17 NOTE — Progress Notes (Signed)
Nutrition Follow-up  DOCUMENTATION CODES:   Obesity unspecified  INTERVENTION:   Tube feeding via Cortrak tube:   Pivot 1.5 at 65 ml/h (1560 ml per day) 90 ml ProSource TF BID  Provides 2500 kcal, 190 gm protein, 1184 ml free water daily  200 ml free water every 4 hours Total free water: 2384 ml    NUTRITION DIAGNOSIS:   Inadequate oral intake related to inability to eat as evidenced by NPO status. Ongoing.   GOAL:   Patient will meet greater than or equal to 90% of their needs Met with TF.   MONITOR:   TF tolerance  REASON FOR ASSESSMENT:   Consult, Ventilator Enteral/tube feeding initiation and management  ASSESSMENT:   Pt with PMH of DM and HTN admitted after falling down basement stairs with TBI/SAH/SDH, significant frontal lobe injuries, occipital bone fx, temporal bone fx extending into middle ear, and R TM rupture.    Pt discussed during ICU rounds and with RN.  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 C.diff negative.  To start CRRT today after line placed.    8/15 s/p cortrak placement; tip gastric 8/25 pt vomited, OG placed with 650 ml out, abd tight  8/26 extubated but required re-intubation 2 hours later; Cortrak coiled in pt's mouth post extubation and removed; NG tube placed with tip in distal stomach  8/27 TF resumed 8/29 s/p trach placement  8/31 cortrak placed; tip gastric  9/1 CRRT started   Medications reviewed and include: colace, lasix, SSI, 10 units novolog every 4 hours, 43 units semglee BID, protonix, miralax, senna, Nabicarb 1300 mg BID Fentanyl  Cardene   Labs reviewed: BUN 143, Cr: 5.40, PO4 6.5 CBG's: 152-157   UOP: 1200 ml  I&O: +17 L Deep pitting edema    Diet Order:   Diet Order             Diet NPO time specified  Diet effective now                   EDUCATION NEEDS:   Not appropriate for education at this time  Skin:  Skin Assessment: Reviewed RN Assessment  Last BM:  225 ml rectal  tube  Height:   Ht Readings from Last 1 Encounters:  12/28/20 6' 2"  (1.88 m)    Weight:   Wt Readings from Last 1 Encounters:  01/13/21 (!) 139.1 kg    BMI:  Body mass index is 39.37 kg/m.  Estimated Nutritional Needs:   Kcal:  2400-2700  Protein:  175-210 grams  Fluid:  > 2 L/day  Lockie Pares., RD, LDN, CNSC See AMiON for contact information

## 2021-01-17 NOTE — Progress Notes (Signed)
Cardiology Progress Note  Patient ID: Angel [name] MRN: [id number] DOB: 09-25-1950 Date of Encounter: 01/17/2021  Primary Cardiologist: Werner Lean, MD  Subjective   Chief Complaint: None.  Nonresponsive on the ventilator.  HPI: Kidney function continues to decline.  BUN rising.  Plans for CRRT today.  In A. fib with RVR.  ROS:  All other ROS reviewed and negative. Pertinent positives noted in the HPI.     Inpatient Medications  Scheduled Meds:  acetaminophen  1,000 mg Per Tube Q6H   bethanechol  25 mg Per Tube TID   chlorhexidine gluconate (MEDLINE KIT)  15 mL Mouth Rinse BID   Chlorhexidine Gluconate Cloth  6 each Topical Q0600   dextrose  12.5 g Intravenous Once   docusate  100 mg Per Tube BID   free water  200 mL Per Tube Q4H   furosemide  80 mg Intravenous Q8H   guaiFENesin  10 mL Per Tube Q4H   hydrALAZINE  25 mg Per Tube Q8H   insulin aspart  0-20 Units Subcutaneous Q4H   insulin aspart  10 Units Subcutaneous Q4H   insulin glargine-yfgn  43 Units Subcutaneous BID   mouth rinse  15 mL Mouth Rinse 10 times per day   methocarbamol  1,000 mg Per Tube Q8H   metoprolol tartrate  50 mg Per Tube BID   pantoprazole sodium  40 mg Per Tube Daily   polyethylene glycol  17 g Per Tube Daily   QUEtiapine  100 mg Per Tube BID   senna  1 tablet Per Tube Daily   sodium bicarbonate  1,300 mg Per Tube BID   sodium chloride flush  10-40 mL Intracatheter Q12H   Continuous Infusions:  sodium chloride     bivalirudin (ANGIOMAX) infusion 0.5 mg/mL (Non-ACS indications) Stopped (01/17/21 0756)   feeding supplement (PIVOT 1.5 CAL) 1,000 mL (01/16/21 2125)   niCARDipine 2.5 mg/hr (01/17/21 1023)   PRN Meds: Place/Maintain arterial line **AND** sodium chloride, hydrALAZINE, HYDROmorphone (DILAUDID) injection, ondansetron **OR** ondansetron (ZOFRAN) IV, oxyCODONE, sodium chloride flush   Vital Signs   Vitals:   01/17/21 0500 01/17/21 0600 01/17/21 0730 01/17/21 0739  BP:     (!) 170/77  Pulse: 76 (!) 112    Resp: (!) 29 (!) 25    Temp:   98.5 F (36.9 C)   TempSrc:   Axillary   SpO2: 95% 97%  95%  Weight:      Height:        Intake/Output Summary (Last 24 hours) at 01/17/2021 1026 Last data filed at 01/17/2021 0800 Gross per 24 hour  Intake 3718.24 ml  Output 725 ml  Net 2993.24 ml   Last 3 Weights 01/13/2021 01/13/2021 01/09/2021  Weight (lbs) 306 lb 10.6 oz 305 lb 8.9 oz 288 lb 12.8 oz  Weight (kg) 139.1 kg 138.6 kg 131 kg      Telemetry  Overnight telemetry shows A. fib with RVR heart rates in the 120s, which I personally reviewed.   Physical Exam   Vitals:   01/17/21 0500 01/17/21 0600 01/17/21 0730 01/17/21 0739  BP:    (!) 170/77  Pulse: 76 (!) 112    Resp: (!) 29 (!) 25    Temp:   98.5 F (36.9 C)   TempSrc:   Axillary   SpO2: 95% 97%  95%  Weight:      Height:        Intake/Output Summary (Last 24 hours) at 01/17/2021 1026 Last data filed at  01/17/2021 0800 Gross per 24 hour  Intake 3718.24 ml  Output 725 ml  Net 2993.24 ml    Last 3 Weights 01/13/2021 01/13/2021 01/09/2021  Weight (lbs) 306 lb 10.6 oz 305 lb 8.9 oz 288 lb 12.8 oz  Weight (kg) 139.1 kg 138.6 kg 131 kg    Body mass index is 39.37 kg/m.  General: Ill-appearing Head: Atraumatic, normal size  Eyes: PEERLA, EOMI  Neck: Supple, JVD 10 to 12 cm of water Endocrine: No thryomegaly Cardiac: Normal S1, S2; irregular rhythm, no murmurs Lungs: Diminished breath sounds bilaterally Abd: Soft, nontender, no hepatomegaly  Ext: 2+ pitting edema up to thighs, upper extremities with pitting edema Musculoskeletal: No deformities Skin: Warm and dry, no rashes   Neuro: Intubated on the vent, not following commands  Labs  High Sensitivity Troponin:   Recent Labs  Lab 01/10/21 1346 01/10/21 1518  TROPONINIHS 52* 56*     Cardiac EnzymesNo results for input(s): TROPONINI in the last 168 hours. No results for input(s): TROPIPOC in the last 168 hours.  Chemistry Recent Labs  Lab  01/10/21 1346 01/11/21 0603 01/15/21 0201 01/16/21 0536 01/16/21 1809 01/17/21 0538  NA 141   < > 146* 146* 147* 145  K 3.5   < > 3.2* 3.0* 3.9 3.7  CL 111   < > 117* 116* 115* 115*  CO2 20*   < > 15* 17* 16* 16*  GLUCOSE 317*   < > 150* 311* 220* 211*  BUN 94*   < > 127* 128* 134* 143*  CREATININE 2.77*   < > 4.90* 4.80* 5.09* 5.40*  CALCIUM 7.9*   < > 8.2* 8.1* 8.2* 8.2*  PROT 5.7*  --   --   --   --   --   ALBUMIN 1.7*  --  1.7* 1.8*  --  1.9*  AST 30  --   --   --   --   --   ALT 34  --   --   --   --   --   ALKPHOS 53  --   --   --   --   --   BILITOT 0.4  --   --   --   --   --   GFRNONAA 24*   < > 12* 12* 11* 11*  ANIONGAP 10   < > 14 13 16* 14   < > = values in this interval not displayed.    Hematology Recent Labs  Lab 01/15/21 0201 01/16/21 0536 01/17/21 0538  WBC 10.1 9.6 10.3  RBC 3.03* 2.98* 2.99*  HGB 7.9* 7.7* 7.7*  HCT 25.9* 25.4* 25.0*  MCV 85.5 85.2 83.6  MCH 26.1 25.8* 25.8*  MCHC 30.5 30.3 30.8  RDW 17.9* 18.1* 18.6*  PLT 572* 542* 591*   BNPNo results for input(s): BNP, PROBNP in the last 168 hours.  DDimer No results for input(s): DDIMER in the last 168 hours.   Radiology  DG CHEST PORT 1 VIEW  Result Date: 01/16/2021 CLINICAL DATA:  Pulmonary edema EXAM: PORTABLE CHEST 1 VIEW COMPARISON:  01/15/2021 FINDINGS: Tracheostomy tube in gastric catheter are again noted and stable. Cardiac shadow is mildly enlarged but stable. Right-sided PICC line is again seen and unchanged in position. Lungs are well aerated bilaterally. Right-sided pleural effusion is noted slightly decreased from the prior exam although this may be positional in nature. No focal confluent infiltrate is noted. Mild vascular congestion is again seen. IMPRESSION: Stable vascular congestion. Right-sided effusion  which appears slightly smaller than that noted on the prior exam. Tubes and lines as described. Electronically Signed   By: Inez Catalina M.D.   On: 01/16/2021 08:24   DG Abd  Portable 1V  Result Date: 01/16/2021 CLINICAL DATA:  Check feeding catheter placement EXAM: PORTABLE ABDOMEN - 1 VIEW COMPARISON:  None. FINDINGS: Weighted feeding catheter is noted in the distal aspect of the stomach. Nonobstructive bowel gas pattern is noted. IMPRESSION: Feeding catheter within the distal stomach. Electronically Signed   By: Inez Catalina M.D.   On: 01/16/2021 14:33    Cardiac Studies  TTE 01/10/2021  1. Left ventricular ejection fraction, by estimation, is 60 to 65%. The  left ventricle has normal function. The left ventricle has no regional  wall motion abnormalities. There is mild left ventricular hypertrophy.  Left ventricular diastolic parameters  were normal.   2. Right ventricular systolic function is mildly reduced. The right  ventricular size is not well visualized.   3. The mitral valve is normal in structure. Trivial mitral valve  regurgitation. No evidence of mitral stenosis.   4. The aortic valve is tricuspid. Aortic valve regurgitation is not  visualized. Mild aortic valve sclerosis is present, with no evidence of  aortic valve stenosis.   Patient Profile  TANK DIFIORE is a 70 y.o. male with hypertension, diabetes, hyperlipidemia who was admitted on 12/28/2020 with fall and subarachnoid hemorrhage.  Course has been very complicated with multifocal pneumonia/septic shock diagnosis on 01/02/2021.  He did develop atrial fibrillation on 01/04/2021 likely secondary to pneumonia and septic shock..  Course is also been complicated by bilateral pulmonary emboli developed on 01/09/2021.  Cardiology initially had signed off but they were reconsulted in the setting of bradycardia/afib.   Assessment & Plan   #Paroxysmal atrial fibrillation -Has had intermittent A. fib episodes since 01/04/2021. -Driven by critical illness, volume overload, multiorgan system failure, acidosis, multifocal pneumonia, bilateral pulmonary emboli -Back in A. fib this morning. -Has had bradycardia  events this admission.  Suspect this is driven by acidosis. -Increase metoprolol to tartrate to 50 mg every 6 hours.  If he becomes unstable we can transition amiodarone.  The combination is led to bradycardia in the past. -Plans to start hemodialysis today.  I suspect his A. fib will improve with volume removal as well as improvement in his acidosis. -Remains on bivalirudin drip.  Can be interrupted for any procedures that are needed.  #Sinus bradycardia -Intermittent episodes of A. fib with RVR as well as bradycardia.  Not ready to call this sick sinus syndrome in the setting of severe acidosis.  I think overall his bradycardia arrhythmias as well as A. fib will improve with initiation of CRRT.  Plans for that today. -We will be cautious with use of beta-blocker to control his A. fib.  Again suspect this will improve with improvement in his acidosis. -For now we will cautiously use metoprolol.  Avoid amiodarone in combination at this time.  #Fall with subarachnoid hemorrhage -Per trauma team.  #Bilateral pulmonary emboli -On bivalirudin  #Multifocal pneumonia -Completed antibiotics 01/16/2021  #Hypertension -Driven by volume overload.  We have added hydralazine 25 mg 3 times daily.  Further titration as you need to.  Again suspect hypertension will improve with volume removal.  For questions or updates, please contact [place] Please consult [email or web address] for contact info under   Time Spent with Patient: I have spent a total of 35 minutes with patient reviewing hospital notes, telemetry, EKGs, labs and  examining the patient as well as establishing an assessment and plan that was discussed with the patient.  > 50% of time was spent in direct patient care.    Signed, Addison Naegeli. Audie Box, MD, [place]  01/17/2021 10:26 AM

## 2021-01-17 NOTE — Procedures (Signed)
Central line  Date/Time: 01/17/2021 1:06 PM Performed by: Georganna Skeans, MD Authorized by: Georganna Skeans, MD   Consent:    Consent obtained:  Written   Consent given by:  Spouse   Risks, benefits, and alternatives were discussed: yes   Universal protocol:    Procedure explained and questions answered to patient or proxy's satisfaction: yes     Relevant documents present and verified: yes     Test results available: yes     Imaging studies available: yes     Required blood products, implants, devices, and special equipment available: yes     Immediately prior to procedure, a time out was called: yes   Pre-procedure details:    Indication(s): central venous access     Hand hygiene: Hand hygiene performed prior to insertion     Sterile barrier technique: All elements of maximal sterile technique followed     Skin preparation:  Chlorhexidine Procedure details:    Location:  L subclavian   Site selection rationale:  BODY HABITUS   Patient position:  Trendelenburg   Procedural supplies:  Triple lumen   Landmarks identified: yes     Ultrasound guidance: no     Number of attempts:  4   Successful placement: yes   Post-procedure details:    Post-procedure:  Dressing applied and line sutured   Assessment:  Placement verified by x-ray, free fluid flow and blood return through all ports   Procedure completion:  Tolerated Comments:     Trialysis line for CRRT

## 2021-01-17 NOTE — Progress Notes (Signed)
Patient ID: Angel [name], male   DOB: 31-Dec-1950, 70 y.o.   MRN: [id number] Follow up - Trauma Critical Care  Patient Details:    Angel [name] is an 70 y.o. male.  Lines/tubes : PICC Triple Lumen 40/34/74 PICC Right Basilic 47 cm 1 cm (Active)  Indication for Insertion or Continuance of Line Vasoactive infusions 01/16/21 2000  Exposed Catheter (cm) 1 cm 01/09/21 0916  Site Assessment Clean;Dry;Intact 01/16/21 2000  Lumen #1 Status Infusing 01/16/21 2000  Lumen #2 Status Infusing 01/16/21 2000  Lumen #3 Status Flushed;Saline locked 01/16/21 2000  Dressing Type Transparent;Securing device 01/16/21 2000  Dressing Status Clean;Dry;Intact 01/17/21 0600  Antimicrobial disc in place? Yes 01/16/21 2000  Safety Lock Not Applicable 25/95/63 8756  Line Care Lumen 1 tubing changed;Connections checked and tightened 01/17/21 0600  Dressing Intervention New dressing;Other (Comment) 01/09/21 0916  Dressing Change Due 01/24/21 01/17/21 0600     Arterial Line 01/10/21 Right Radial (Active)  Site Assessment Clean;Dry;Intact 01/16/21 2000  Line Status Pulsatile blood flow 01/16/21 2000  Art Line Waveform Appropriate 01/16/21 2000  Art Line Interventions Zeroed and calibrated 01/16/21 2000  Color/Movement/Sensation Capillary refill less than 3 sec 01/16/21 2000  Dressing Type Transparent 01/16/21 2000  Dressing Status Clean;Dry;Intact;Antimicrobial disc in place 01/16/21 2000  Dressing Change Due 01/17/21 01/16/21 2000     NG/OG Vented/Dual Lumen Left nare (Active)  Ongoing Placement Verification (Required) (See row information) Yes 01/16/21 0800  Site Assessment Clean;Dry;Intact 01/16/21 0800  Interventions Cleansed 01/16/21 0800  Status Feeding 01/16/21 0800     External Urinary Catheter (Active)  Collection Container Standard drainage bag 01/16/21 2000  Suction (Verified suction is between 40-80 mmHg) N/A (Patient has condom catheter) 01/16/21 0800  Securement Method Securing device (Describe)  01/16/21 2000  Site Assessment Clean;Intact 01/16/21 2000  Intervention Male External Urinary Catheter Replaced 01/14/21 2000  Output (mL) 50 mL 01/17/21 0600     Fecal Management System (Active)  Does patient meet criteria for removal? No 01/16/21 2000  Daily care Skin around tube assessed 01/16/21 2000  Output (mL) 225 mL 01/16/21 1800  Intake (mL) 30 mL 01/06/21 0520    Microbiology/Sepsis markers: Results for orders placed or performed during the hospital encounter of 12/28/20  Resp Panel by RT-PCR (Flu A&B, Covid) Nasopharyngeal Swab     Status: None   Collection Time: 12/28/20  4:17 PM   Specimen: Nasopharyngeal Swab; Nasopharyngeal(NP) swabs in vial transport medium  Result Value Ref Range Status   SARS Coronavirus 2 by RT PCR NEGATIVE NEGATIVE Final    Comment: (NOTE) SARS-CoV-2 target nucleic acids are NOT DETECTED.  The SARS-CoV-2 RNA is generally detectable in upper respiratory specimens during the acute phase of infection. The lowest concentration of SARS-CoV-2 viral copies this assay can detect is 138 copies/mL. A negative result does not preclude SARS-Cov-2 infection and should not be used as the sole basis for treatment or other patient management decisions. A negative result may occur with  improper specimen collection/handling, submission of specimen other than nasopharyngeal swab, presence of viral mutation(s) within the areas targeted by this assay, and inadequate number of viral copies(<138 copies/mL). A negative result must be combined with clinical observations, patient history, and epidemiological information. The expected result is Negative.  Fact Sheet for Patients:  EntrepreneurPulse.com.au  Fact Sheet for Healthcare Providers:  IncredibleEmployment.be  This test is no t yet approved or cleared by the [place] FDA and  has been authorized for detection and/or diagnosis of SARS-CoV-2 by FDA under  an Emergency  Use Authorization (EUA). This EUA will remain  in effect (meaning this test can be used) for the duration of the COVID-19 declaration under Section 564(b)(1) of the Act, 21 U.S.C.section 360bbb-3(b)(1), unless the authorization is terminated  or revoked sooner.       Influenza A by PCR NEGATIVE NEGATIVE Final   Influenza B by PCR NEGATIVE NEGATIVE Final    Comment: (NOTE) The Xpert Xpress SARS-CoV-2/FLU/RSV plus assay is intended as an aid in the diagnosis of influenza from Nasopharyngeal swab specimens and should not be used as a sole basis for treatment. Nasal washings and aspirates are unacceptable for Xpert Xpress SARS-CoV-2/FLU/RSV testing.  Fact Sheet for Patients: EntrepreneurPulse.com.au  Fact Sheet for Healthcare Providers: IncredibleEmployment.be  This test is not yet approved or cleared by the [place] FDA and has been authorized for detection and/or diagnosis of SARS-CoV-2 by FDA under an Emergency Use Authorization (EUA). This EUA will remain in effect (meaning this test can be used) for the duration of the COVID-19 declaration under Section 564(b)(1) of the Act, 21 U.S.C. section 360bbb-3(b)(1), unless the authorization is terminated or revoked.  Performed at [facility], [place] [address] [place], [place] [address]   MRSA Next Gen by PCR, Nasal     Status: None   Collection Time: 12/28/20  7:32 PM   Specimen: Nasal Mucosa; Nasal Swab  Result Value Ref Range Status   MRSA by PCR Next Gen NOT DETECTED NOT DETECTED Final    Comment: (NOTE) The GeneXpert MRSA Assay (FDA approved for NASAL specimens only), is one component of a comprehensive MRSA colonization surveillance program. It is not intended to diagnose MRSA infection nor to guide or monitor treatment for MRSA infections. Test performance is not FDA approved in patients less than 71 years old. Performed at [facility], [place] [address] [place],  [place] [address]   Culture, Respiratory w Gram Stain     Status: None   Collection Time: 12/31/20 11:06 AM   Specimen: Tracheal Aspirate; Respiratory  Result Value Ref Range Status   Specimen Description TRACHEAL ASPIRATE  Final   Special Requests NONE  Final   Gram Stain   Final    FEW SQUAMOUS EPITHELIAL CELLS PRESENT FEW WBC PRESENT,BOTH PMN AND MONONUCLEAR FEW GRAM POSITIVE COCCI Performed at [facility], [place] [address], [place], [place] [address]    Culture   Final    FEW PSEUDOMONAS AERUGINOSA FEW STREPTOCOCCUS PNEUMONIAE    Report Status 01/03/2021 FINAL  Final   Organism ID, Bacteria PSEUDOMONAS AERUGINOSA  Final   Organism ID, Bacteria STREPTOCOCCUS PNEUMONIAE  Final      Susceptibility   Pseudomonas aeruginosa - MIC*    CEFTAZIDIME 4 SENSITIVE Sensitive     CIPROFLOXACIN <=0.25 SENSITIVE Sensitive     GENTAMICIN <=1 SENSITIVE Sensitive     IMIPENEM 2 SENSITIVE Sensitive     PIP/TAZO 8 SENSITIVE Sensitive     CEFEPIME 2 SENSITIVE Sensitive     * FEW PSEUDOMONAS AERUGINOSA   Streptococcus pneumoniae - MIC*    ERYTHROMYCIN 4 RESISTANT Resistant     LEVOFLOXACIN 0.5 SENSITIVE Sensitive     VANCOMYCIN <=0.12 SENSITIVE Sensitive     PENO - penicillin <=0.06      PENICILLIN (non-meningitis) <=0.06 SENSITIVE Sensitive     PENICILLIN (oral) <=0.06 SENSITIVE Sensitive     CEFTRIAXONE (non-meningitis) <=0.12 SENSITIVE Sensitive     * FEW STREPTOCOCCUS PNEUMONIAE  Culture, blood (routine x 2)  Status: None   Collection Time: 01/05/21 11:44 AM   Specimen: BLOOD  Result Value Ref Range Status   Specimen Description BLOOD SITE NOT SPECIFIED  Final   Special Requests AEROBIC BOTTLE ONLY Blood Culture adequate volume  Final   Culture   Final    NO GROWTH 5 DAYS Performed at [facility], [address] [place], [place] [address]    Report Status 01/10/2021 FINAL  Final  Culture, blood (routine x 2)     Status: None   Collection Time: 01/05/21 11:44 AM    Specimen: BLOOD  Result Value Ref Range Status   Specimen Description BLOOD SITE NOT SPECIFIED  Final   Special Requests   Final    AEROBIC BOTTLE ONLY Blood Culture results may not be optimal due to an inadequate volume of blood received in culture bottles   Culture   Final    NO GROWTH 5 DAYS Performed at [facility], [place] [address] [place], [place] [address]    Report Status 01/10/2021 FINAL  Final  Surgical PCR screen     Status: None   Collection Time: 01/08/21 12:14 AM   Specimen: Nasal Mucosa; Nasal Swab  Result Value Ref Range Status   MRSA, PCR NEGATIVE NEGATIVE Final   Staphylococcus aureus NEGATIVE NEGATIVE Final    Comment: (NOTE) The Xpert SA Assay (FDA approved for NASAL specimens in patients 93 years of age and older), is one component of a comprehensive surveillance program. It is not intended to diagnose infection nor to guide or monitor treatment. Performed at [facility], [place] [address] [place], [place] [address]   Culture, Respiratory w Gram Stain     Status: None   Collection Time: 01/08/21  1:24 PM   Specimen: Tracheal Aspirate; Respiratory  Result Value Ref Range Status   Specimen Description TRACHEAL ASPIRATE  Final   Special Requests NONE  Final   Gram Stain   Final    RARE SQUAMOUS EPITHELIAL CELLS PRESENT MODERATE WBC PRESENT, PREDOMINANTLY MONONUCLEAR FEW GRAM NEGATIVE RODS Performed at [facility], [place] [address] [place], [place] [address]    Culture   Final    RARE PSEUDOMONAS AERUGINOSA RARE ENTEROCOCCUS FAECALIS    Report Status 01/11/2021 FINAL  Final   Organism ID, Bacteria PSEUDOMONAS AERUGINOSA  Final   Organism ID, Bacteria ENTEROCOCCUS FAECALIS  Final      Susceptibility   Enterococcus faecalis - MIC*    AMPICILLIN <=2 SENSITIVE Sensitive     VANCOMYCIN 1 SENSITIVE Sensitive     GENTAMICIN SYNERGY SENSITIVE Sensitive     * RARE ENTEROCOCCUS FAECALIS   Pseudomonas aeruginosa - MIC*    CEFTAZIDIME 4  SENSITIVE Sensitive     CIPROFLOXACIN <=0.25 SENSITIVE Sensitive     GENTAMICIN <=1 SENSITIVE Sensitive     IMIPENEM 2 SENSITIVE Sensitive     PIP/TAZO 8 SENSITIVE Sensitive     CEFEPIME 2 SENSITIVE Sensitive     * RARE PSEUDOMONAS AERUGINOSA  Gastrointestinal Panel by PCR , Stool     Status: None   Collection Time: 01/08/21  5:04 PM   Specimen: Stool  Result Value Ref Range Status   Campylobacter species NOT DETECTED NOT DETECTED Final   Plesimonas shigelloides NOT DETECTED NOT DETECTED Final   Salmonella species NOT DETECTED NOT DETECTED Final   Yersinia enterocolitica NOT DETECTED NOT DETECTED Final   Vibrio species NOT DETECTED NOT DETECTED Final   Vibrio cholerae NOT DETECTED NOT DETECTED  Final   Enteroaggregative E coli (EAEC) NOT DETECTED NOT DETECTED Final   Enteropathogenic E coli (EPEC) NOT DETECTED NOT DETECTED Final   Enterotoxigenic E coli (ETEC) NOT DETECTED NOT DETECTED Final   Shiga like toxin producing E coli (STEC) NOT DETECTED NOT DETECTED Final   Shigella/Enteroinvasive E coli (EIEC) NOT DETECTED NOT DETECTED Final   Cryptosporidium NOT DETECTED NOT DETECTED Final   Cyclospora cayetanensis NOT DETECTED NOT DETECTED Final   Entamoeba histolytica NOT DETECTED NOT DETECTED Final   Giardia lamblia NOT DETECTED NOT DETECTED Final   Adenovirus F40/41 NOT DETECTED NOT DETECTED Final   Astrovirus NOT DETECTED NOT DETECTED Final   Norovirus GI/GII NOT DETECTED NOT DETECTED Final   Rotavirus A NOT DETECTED NOT DETECTED Final   Sapovirus (I, II, IV, and V) NOT DETECTED NOT DETECTED Final    Comment: Performed at [facility], [place] [place], [place] [address]  C Difficile Quick Screen (NO PCR Reflex)     Status: None   Collection Time: 01/09/21 11:07 AM   Specimen: STOOL  Result Value Ref Range Status   C Diff antigen NEGATIVE NEGATIVE Final   C Diff toxin NEGATIVE NEGATIVE Final   C Diff interpretation No C. difficile detected.  Final    Comment:  Performed at [facility], [place] [address], [place], [place] [address]    Anti-infectives:  Anti-infectives (From admission, onward)    Start     Dose/Rate Route Frequency Ordered Stop   01/11/21 2330  ceFEPIme (MAXIPIME) 2 g in sodium chloride 0.9 % 100 mL IVPB  Status:  Discontinued        2 g 200 mL/hr over 30 Minutes Intravenous Every 24 hours 01/11/21 0711 01/16/21 0907   01/10/21 1645  ampicillin (OMNIPEN) 2 g in sodium chloride 0.9 % 100 mL IVPB  Status:  Discontinued        2 g 300 mL/hr over 20 Minutes Intravenous Every 8 hours 01/10/21 1549 01/16/21 0907   01/09/21 2200  ceFEPIme (MAXIPIME) 2 g in sodium chloride 0.9 % 100 mL IVPB  Status:  Discontinued        2 g 200 mL/hr over 30 Minutes Intravenous Every 12 hours 01/09/21 1458 01/11/21 0711   01/08/21 1515  metroNIDAZOLE (FLAGYL) IVPB 500 mg  Status:  Discontinued        500 mg 100 mL/hr over 60 Minutes Intravenous Every 8 hours 01/08/21 1428 01/10/21 1618   01/03/21 0600  vancomycin (VANCOREADY) IVPB 1250 mg/250 mL  Status:  Discontinued        1,250 mg 166.7 mL/hr over 90 Minutes Intravenous Every 12 hours 01/02/21 1717 01/03/21 0837   01/02/21 1800  vancomycin (VANCOREADY) IVPB 2000 mg/400 mL        2,000 mg 200 mL/hr over 120 Minutes Intravenous  Once 01/02/21 1712 01/02/21 2007   01/02/21 0900  ceFEPIme (MAXIPIME) 2 g in sodium chloride 0.9 % 100 mL IVPB  Status:  Discontinued        2 g 200 mL/hr over 30 Minutes Intravenous Every 8 hours 01/02/21 0849 01/09/21 1458       Best Practice/Protocols:  VTE Prophylaxis: Direct Thrombin Inhibitor Continous Sedation  Consults: Treatment Team:  Izora Gala, MD Georganna Skeans, MD Roney Jaffe, MD    Studies:    Events:  Subjective:    Overnight Issues:   Objective:  Vital signs for last 24 hours: Temp:  [98 F (36.7 C)-98.5 F (36.9 C)] 98.5 F (36.9  C) (09/01 0730) Pulse Rate:  [51-130] 112 (09/01 0600) Resp:  [17-31] 25 (09/01 0600) BP:  (126-195)/(57-146) 170/77 (09/01 0739) SpO2:  [90 %-97 %] 95 % (09/01 0739) Arterial Line BP: (81-172)/(46-72) 150/69 (09/01 0600) FiO2 (%):  [50 %-60 %] 50 % (09/01 0739)  Hemodynamic parameters for last 24 hours:    Intake/Output from previous day: 08/31 0701 - 09/01 0700 In: 4492 [I.V.:1448.7; NG/GT:2997.3; IV Piggyback:46] Out: 7169 [Urine:1200; Stool:225]  Intake/Output this shift: No intake/output data recorded.  Vent settings for last 24 hours: Vent Mode: PRVC FiO2 (%):  [50 %-60 %] 50 % Set Rate:  [20 bmp] 20 bmp Vt Set:  [650 mL] 650 mL PEEP:  [5 cmH20] 5 cmH20 Pressure Support:  [5 cmH20-10 cmH20] 10 cmH20 Plateau Pressure:  [16 cmH20-30 cmH20] 30 cmH20  Physical Exam:  General: on vent Neuro: a bit agitated, not F/C HEENT/Neck: trach-clean, intact Resp: some rales CVS: IRR GI: softer, NT Extremities: edema 2+  Results for orders placed or performed during the hospital encounter of 12/28/20 (from the past 24 hour(s))  Glucose, capillary     Status: Abnormal   Collection Time: 01/16/21  8:12 AM  Result Value Ref Range   Glucose-Capillary 257 (H) 70 - 99 mg/dL  Glucose, capillary     Status: Abnormal   Collection Time: 01/16/21 12:24 PM  Result Value Ref Range   Glucose-Capillary 259 (H) 70 - 99 mg/dL  APTT     Status: Abnormal   Collection Time: 01/16/21  1:09 PM  Result Value Ref Range   aPTT 72 (H) 24 - 36 seconds  Glucose, capillary     Status: Abnormal   Collection Time: 01/16/21  3:51 PM  Result Value Ref Range   Glucose-Capillary 208 (H) 70 - 99 mg/dL  APTT     Status: Abnormal   Collection Time: 01/16/21  6:09 PM  Result Value Ref Range   aPTT 68 (H) 24 - 36 seconds  Basic metabolic panel     Status: Abnormal   Collection Time: 01/16/21  6:09 PM  Result Value Ref Range   Sodium 147 (H) 135 - 145 mmol/L   Potassium 3.9 3.5 - 5.1 mmol/L   Chloride 115 (H) 98 - 111 mmol/L   CO2 16 (L) 22 - 32 mmol/L   Glucose, Bld 220 (H) 70 - 99 mg/dL   BUN  134 (H) 8 - 23 mg/dL   Creatinine, Ser 5.09 (H) 0.61 - 1.24 mg/dL   Calcium 8.2 (L) 8.9 - 10.3 mg/dL   GFR, Estimated 11 (L) >60 mL/min   Anion gap 16 (H) 5 - 15  Glucose, capillary     Status: Abnormal   Collection Time: 01/16/21  7:48 PM  Result Value Ref Range   Glucose-Capillary 207 (H) 70 - 99 mg/dL  Glucose, capillary     Status: Abnormal   Collection Time: 01/16/21 11:41 PM  Result Value Ref Range   Glucose-Capillary 194 (H) 70 - 99 mg/dL  Glucose, capillary     Status: Abnormal   Collection Time: 01/17/21  3:45 AM  Result Value Ref Range   Glucose-Capillary 223 (H) 70 - 99 mg/dL  CBC     Status: Abnormal   Collection Time: 01/17/21  5:38 AM  Result Value Ref Range   WBC 10.3 4.0 - 10.5 K/uL   RBC 2.99 (L) 4.22 - 5.81 MIL/uL   Hemoglobin 7.7 (L) 13.0 - 17.0 g/dL   HCT 25.0 (L) 39.0 - 52.0 %  MCV 83.6 80.0 - 100.0 fL   MCH 25.8 (L) 26.0 - 34.0 pg   MCHC 30.8 30.0 - 36.0 g/dL   RDW 18.6 (H) 11.5 - 15.5 %   Platelets 591 (H) 150 - 400 K/uL   nRBC 0.0 0.0 - 0.2 %  APTT     Status: Abnormal   Collection Time: 01/17/21  5:38 AM  Result Value Ref Range   aPTT 66 (H) 24 - 36 seconds  Renal function panel     Status: Abnormal   Collection Time: 01/17/21  5:38 AM  Result Value Ref Range   Sodium 145 135 - 145 mmol/L   Potassium 3.7 3.5 - 5.1 mmol/L   Chloride 115 (H) 98 - 111 mmol/L   CO2 16 (L) 22 - 32 mmol/L   Glucose, Bld 211 (H) 70 - 99 mg/dL   BUN 143 (H) 8 - 23 mg/dL   Creatinine, Ser 5.40 (H) 0.61 - 1.24 mg/dL   Calcium 8.2 (L) 8.9 - 10.3 mg/dL   Phosphorus 6.5 (H) 2.5 - 4.6 mg/dL   Albumin 1.9 (L) 3.5 - 5.0 g/dL   GFR, Estimated 11 (L) >60 mL/min   Anion gap 14 5 - 15  Phosphorus     Status: Abnormal   Collection Time: 01/17/21  5:38 AM  Result Value Ref Range   Phosphorus 6.7 (H) 2.5 - 4.6 mg/dL  Glucose, capillary     Status: Abnormal   Collection Time: 01/17/21  7:29 AM  Result Value Ref Range   Glucose-Capillary 157 (H) 70 - 99 mg/dL    Assessment  & Plan: Present on Admission: **None**    LOS: 20 days   Additional comments:I reviewed the patient's new clinical lab test results. . Fall down stairs 8/12   VDRF - guaifenisen, wean, S/P trach 8/29 by Dr. Bobbye Morton ID - resp CX now with pseud and enterococcus - completed maxipime/ampicillin 8/31, CT A/P with ascending colitis and distention. Stool studies and C. dif are all negative TBI/SAH/SDH - NSGY c/s, Dr. Annette Stable. Significant frontal lobe injuries. Keppra x7d for sz ppx. Occipital bone fx - NSGY c/s, Dr. Annette Stable Temporal bone fx extending into middle ear - ENT c/s, Dr. Constance Holster Right TM Rupture - ENT c/s, Dr. Constance Holster AFRVR - amio off, appreciate Cardiology, back on cardene, lopressor 50mg  BID ABL anemia  Bilateral pulmonary embolism - bival AKI - CRT up to 5.4 and decreased U/O. Anticipate will start CRRT per Renal today. Will D/W them and plan to hold Bival to place HD cath. Hx DM2 - resistant SSI, glargine 43u BID Hx HTN - PRN meds FEN - NPO, TF VTE - SCDs, bival gtt Foley - removed Dispo - ICU, wean, likely start CRRT Critical Care Total Time*: 35 Minutes  Georganna Skeans, MD, MPH, FACS Trauma & General Surgery Use [email or web address] to contact on call provider  01/17/2021  *Care during the described time interval was provided by me. I have reviewed this patient's available data, including medical history, events of note, physical examination and test results as part of my evaluation.

## 2021-01-17 NOTE — TOC Progression Note (Signed)
Transition of Care [facility]) - Progression Note    Patient Details  Name: Angel [name] MRN: [id number] Date of Birth: July 06, 1950  Transition of Care [facility]) CM/SW [place], RN Phone Number: 01/17/2021, 3:08 PM  Clinical Narrative:     Patients renal function deteriorating  central line placed to start CRRT, intubated. Recommendation prior to intubation and start of CRRT was CIR CM will continue to follow for needs, Progression and transitions    Barriers to Discharge: Continued Medical Work up  Expected Discharge Plan and Services     Discharge Planning Services: CM Consult   Living arrangements for the past 2 months: Single Family Home                                       Social Determinants of Health (SDOH) Interventions    Readmission Risk Interventions No flowsheet data found.

## 2021-01-17 NOTE — Progress Notes (Signed)
ANTICOAGULATION CONSULT NOTE  Pharmacy Consult for bivalirudin Indication: atrial fibrillation, DVT, PE 8/23   No Known Allergies  Patient Measurements: Height: 6\' 2"  (188 cm) Weight: (!) 137.1 kg (302 lb 4 oz) IBW/kg (Calculated) : 82.2 Heparin Dosing Weight: 107kg  Vital Signs: Temp: 97.6 F (36.4 C) (09/01 2001) Temp Source: Axillary (09/01 2001) BP: 152/72 (09/01 1526) Pulse Rate: 99 (09/01 1900)  Labs: Recent Labs    01/15/21 0201 01/16/21 0536 01/16/21 1309 01/16/21 1809 01/17/21 0538 01/17/21 1007 01/17/21 1610 01/17/21 1856  HGB 7.9* 7.7*  --   --  7.7*  --   --   --   HCT 25.9* 25.4*  --   --  25.0*  --   --   --   PLT 572* 542*  --   --  591*  --   --   --   APTT 60* 77*   < > 68* 66* 58*  --  51*  CREATININE 4.90* 4.80*  --  5.09* 5.40*  --  5.50*  --    < > = values in this interval not displayed.     Estimated Creatinine Clearance: 18.4 mL/min (A) (by C-G formula based on SCr of 5.5 mg/dL (H)).   Assessment: 10 YOM presenting s/p fall with TBI/SAH and facial fx, in afib started on amiodarone and now cleared per trauma for full dose anticoagulation. 8/23 patient found to have small acute bilateral PE and age-indeterminate LUE DVT. Pharmacy consulted to dose bivalirudin per Trauma.  Given recent head bleed, will aim for middle of therapeutic range aptt and watch closely for signs and symptoms of bleeding.    Bival resumed ~ 3pm s/p planned HD cath placement.  aPTT remains therapeutic on 0.02 mg/kg/hr.  May need increased requirements now that CRRT started.   Goal of Therapy:  Aptt goal ~50-65s per discussion with Trauma  Monitor platelets by anticoagulation protocol: Yes   Plan:  - Continue Bivalirudin at 0.02 mg/kg/hr - Follow-up with AM labs - Will continue to monitor for any signs/symptoms of bleeding  Thank you for allowing pharmacy to be a part of this patient's care.  [employer], Pharm.D., BCPS Clinical Pharmacist  **Pharmacist  phone directory can be found on [email or web address] listed under [place].  01/17/2021 8:56 PM

## 2021-01-17 NOTE — Progress Notes (Signed)
Patient ID: Angel [name], male   DOB: 1951-04-03, 70 y.o.   MRN: [id number] I updated his wife at the bedside. Georganna Skeans, MD, MPH, FACS Please use [email or web address] to contact on call provider

## 2021-01-17 NOTE — Progress Notes (Signed)
[place] for bivalirudin Indication: atrial fibrillation, DVT, PE 8/23   No Known Allergies  Patient Measurements: Height: 6\' 2"  (188 cm) Weight: (!) 139.1 kg (306 lb 10.6 oz) IBW/kg (Calculated) : 82.2 Heparin Dosing Weight: 107kg  Vital Signs: Temp: 98.5 F (36.9 C) (09/01 0730) Temp Source: Axillary (09/01 0730) BP: 153/64 (09/01 0400) Pulse Rate: 112 (09/01 0600)  Labs: Recent Labs    01/15/21 0201 01/16/21 0536 01/16/21 1309 01/16/21 1809 01/17/21 0538  HGB 7.9* 7.7*  --   --  7.7*  HCT 25.9* 25.4*  --   --  25.0*  PLT 572* 542*  --   --  591*  APTT 60* 77* 72* 68* 66*  CREATININE 4.90* 4.80*  --  5.09* 5.40*     Estimated Creatinine Clearance: 18.9 mL/min (A) (by C-G formula based on SCr of 5.4 mg/dL (H)).   Assessment: 47 YOM presenting s/p fall with TBI/SAH and facial fx, in afib started on amiodarone and now cleared per trauma for full dose anticoagulation. 8/23 patient found to have small acute bilateral PE and age-indeterminate LUE DVT. Pharmacy consulted to dose bivalirudin per Trauma.  Given recent head bleed, will aim for middle of therapeutic range aptt and watch closely for signs and symptoms of bleeding.    aPTT this morning 66 and close to goal range of 50-65. Bival turned off this morning for planned HD cath placement - to resume ~2 hours post-procedure. May need increased requirements now that CRRT started.   Goal of Therapy:  Aptt goal ~50-65s per discussion with Trauma  Monitor platelets by anticoagulation protocol: Yes   Plan:  - Restart Bivalirudin at 0.02 mg/kg/hr - Recheck aPTT in 4 hours after restarting - Will continue to monitor for any signs/symptoms of bleeding  Thank you for allowing pharmacy to be a part of this patient's care.  Alycia Rossetti, PharmD, BCPS Clinical Pharmacist Clinical phone for 01/17/2021: [phone] 01/17/2021 2:41 PM   **Pharmacist phone directory can now be found on  [email or web address] ([email or web address]).  Listed under [place].

## 2021-01-17 NOTE — Progress Notes (Signed)
Patient ID: Angel [name], male   DOB: Jul 08, 1950, 70 y.o.   MRN: [id number] S:Had significant drop in UOP overnight. O:BP 111/67   Pulse 88   Temp 98.4 F (36.9 C) (Axillary)   Resp (!) 24   Ht $R'6\' 2"'pa$  (1.88 m)   Wt (!) 139.1 kg   SpO2 94%   BMI 39.37 kg/m   Intake/Output Summary (Last 24 hours) at 01/17/2021 1146 Last data filed at 01/17/2021 1000 Gross per 24 hour  Intake 3938.91 ml  Output 725 ml  Net 3213.91 ml   Intake/Output: I/O last 3 completed shifts: In: 6658.8 [I.V.:2470.6; NG/GT:3842.3; IV Piggyback:346] Out: 6773 [Urine:2750; Stool:425]  Intake/Output this shift:  Total I/O In: 679.8 [I.V.:219.8; NG/GT:460] Out: -  Weight change:  Gen:on vent via trach, sedated CVS: IRR, no rub Resp:decreased BS bilaterally Abd:+BS, soft Ext:2+ anasarca  Recent Labs  Lab 01/10/21 1346 01/11/21 0603 01/12/21 0552 01/13/21 0500 01/14/21 0500 01/15/21 0201 01/16/21 0536 01/16/21 1809 01/17/21 0538  NA 141 146* 147* 147* 147* 146* 146* 147* 145  K 3.5 3.2* 3.6 3.6 3.1* 3.2* 3.0* 3.9 3.7  CL 111 115* 118* 120* 118* 117* 116* 115* 115*  CO2 20* 19* 18* 16* 16* 15* 17* 16* 16*  GLUCOSE 317* 119* 71 106* 69* 150* 311* 220* 211*  BUN 94* 112* 124* 134* 131* 127* 128* 134* 143*  CREATININE 2.77* 3.43* 4.49* 4.96* 5.04* 4.90* 4.80* 5.09* 5.40*  ALBUMIN 1.7*  --   --   --   --  1.7* 1.8*  --  1.9*  CALCIUM 7.9* 8.2* 8.0* 7.8* 8.0* 8.2* 8.1* 8.2* 8.2*  PHOS  --  5.4* 6.6*  --   --  7.9* 6.9*  --  6.5*  6.7*  AST 30  --   --   --   --   --   --   --   --   ALT 34  --   --   --   --   --   --   --   --    Liver Function Tests: Recent Labs  Lab 01/10/21 1346 01/15/21 0201 01/16/21 0536 01/17/21 0538  AST 30  --   --   --   ALT 34  --   --   --   ALKPHOS 53  --   --   --   BILITOT 0.4  --   --   --   PROT 5.7*  --   --   --   ALBUMIN 1.7* 1.7* 1.8* 1.9*   No results for input(s): LIPASE, AMYLASE in the last 168 hours. No results for input(s): AMMONIA in the last 168  hours. CBC: Recent Labs  Lab 01/13/21 0500 01/13/21 1245 01/14/21 0500 01/15/21 0201 01/16/21 0536 01/17/21 0538  WBC 7.5  --  9.7 10.1 9.6 10.3  HGB 6.8*   < > 7.7* 7.9* 7.7* 7.7*  HCT 22.4*   < > 25.0* 25.9* 25.4* 25.0*  MCV 85.2  --  83.6 85.5 85.2 83.6  PLT 432*  --  518* 572* 542* 591*   < > = values in this interval not displayed.   Cardiac Enzymes: No results for input(s): CKTOTAL, CKMB, CKMBINDEX, TROPONINI in the last 168 hours. CBG: Recent Labs  Lab 01/16/21 1948 01/16/21 2341 01/17/21 0345 01/17/21 0729 01/17/21 1104  GLUCAP 207* 194* 223* 157* 152*    Iron Studies: No results for input(s): IRON, TIBC, TRANSFERRIN, FERRITIN in the last 72 hours. Studies/Results: DG CHEST  PORT 1 VIEW  Result Date: 01/16/2021 CLINICAL DATA:  Pulmonary edema EXAM: PORTABLE CHEST 1 VIEW COMPARISON:  01/15/2021 FINDINGS: Tracheostomy tube in gastric catheter are again noted and stable. Cardiac shadow is mildly enlarged but stable. Right-sided PICC line is again seen and unchanged in position. Lungs are well aerated bilaterally. Right-sided pleural effusion is noted slightly decreased from the prior exam although this may be positional in nature. No focal confluent infiltrate is noted. Mild vascular congestion is again seen. IMPRESSION: Stable vascular congestion. Right-sided effusion which appears slightly smaller than that noted on the prior exam. Tubes and lines as described. Electronically Signed   By: Inez Catalina M.D.   On: 01/16/2021 08:24   DG Abd Portable 1V  Result Date: 01/16/2021 CLINICAL DATA:  Check feeding catheter placement EXAM: PORTABLE ABDOMEN - 1 VIEW COMPARISON:  None. FINDINGS: Weighted feeding catheter is noted in the distal aspect of the stomach. Nonobstructive bowel gas pattern is noted. IMPRESSION: Feeding catheter within the distal stomach. Electronically Signed   By: Inez Catalina M.D.   On: 01/16/2021 14:33    acetaminophen  1,000 mg Per Tube Q6H   bethanechol   25 mg Per Tube TID   chlorhexidine gluconate (MEDLINE KIT)  15 mL Mouth Rinse BID   Chlorhexidine Gluconate Cloth  6 each Topical Q0600   docusate  100 mg Per Tube BID   free water  200 mL Per Tube Q4H   furosemide  80 mg Intravenous Q8H   guaiFENesin  10 mL Per Tube Q4H   hydrALAZINE  25 mg Per Tube Q8H   insulin aspart  0-20 Units Subcutaneous Q4H   insulin aspart  10 Units Subcutaneous Q4H   insulin glargine-yfgn  43 Units Subcutaneous BID   mouth rinse  15 mL Mouth Rinse 10 times per day   methocarbamol  1,000 mg Per Tube Q8H   metoprolol tartrate  50 mg Per Tube Q6H   pantoprazole sodium  40 mg Per Tube Daily   polyethylene glycol  17 g Per Tube Daily   QUEtiapine  100 mg Per Tube BID   senna  1 tablet Per Tube Daily   sodium bicarbonate  1,300 mg Per Tube BID   sodium chloride flush  10-40 mL Intracatheter Q12H    BMET    Component Value Date/Time   NA 145 01/17/2021 0538   K 3.7 01/17/2021 0538   CL 115 (H) 01/17/2021 0538   CO2 16 (L) 01/17/2021 0538   GLUCOSE 211 (H) 01/17/2021 0538   BUN 143 (H) 01/17/2021 0538   CREATININE 5.40 (H) 01/17/2021 0538   CALCIUM 8.2 (L) 01/17/2021 0538   GFRNONAA 11 (L) 01/17/2021 0538   CBC    Component Value Date/Time   WBC 10.3 01/17/2021 0538   RBC 2.99 (L) 01/17/2021 0538   HGB 7.7 (L) 01/17/2021 0538   HCT 25.0 (L) 01/17/2021 0538   PLT 591 (H) 01/17/2021 0538   MCV 83.6 01/17/2021 0538   MCH 25.8 (L) 01/17/2021 0538   MCHC 30.8 01/17/2021 0538   RDW 18.6 (H) 01/17/2021 0538    Assessment/ Plan: AKI - Multifactorial including ischemic ATN in setting of hypotensive episode/ sepsis earlier in hosp stay complicated by contrast injury.  BP's stable now.  Nonoliguric, patient making urine.  UNa 26, had some proteinuria on UA earlier in hospitalization and albumin of 1.7.  Will recheck UA and order 24 hour urine protein 2.3 grams.   Marked drop off in UOP overnight with rising  BUN/Cr.  Discussed case with Dr. [name] and  agree with plan to initiate CRRT with UF as tolerated. Avoid nephrotoxic agents such as IV contrast, NSAIDs/Cox II I's, and Fleets enemas as will worsen renal failure Renal dose meds. Will start CRRT:  4k/2.5Ca dialysate at 1500 ml/hr, 4K/2.5Ca for pre and post filter replacement fluids at 300 ml/hr.  No heparin as he is on bivalirudin.  UF 100-175m/hr as tolerated Fall/ bilat SAH - no surgery required, fall was on 12/28/20 Resp failure - vent support since admission Hypernatremia - improved with free water Resp infection - grew strep/ pseudomonas from resp secretions and was rx'd w/ IV abx from 8/17 to current (only 1 dose IV vanc given on 8/17 then dc'd).  Atrial fib / RVR - in sinus rhythm now Anasarca - as above.  He does have low albumin and is responding to IV lasix. Will check 24 hour urine for protein as well as SPEP/UPEP.  IV albumin to help with intravascular volume [id number] Metabolic acidosis - has worsened over the past 2 days, to start CVVHD.  JDonetta Potts MD [employer]([phone]

## 2021-01-18 DIAGNOSIS — I4819 Other persistent atrial fibrillation: Secondary | ICD-10-CM | POA: Diagnosis not present

## 2021-01-18 DIAGNOSIS — J9601 Acute respiratory failure with hypoxia: Secondary | ICD-10-CM | POA: Diagnosis not present

## 2021-01-18 DIAGNOSIS — R001 Bradycardia, unspecified: Secondary | ICD-10-CM | POA: Diagnosis not present

## 2021-01-18 DIAGNOSIS — Z20822 Contact with and (suspected) exposure to covid-19: Secondary | ICD-10-CM | POA: Diagnosis not present

## 2021-01-18 DIAGNOSIS — S066X9A Traumatic subarachnoid hemorrhage with loss of consciousness of unspecified duration, initial encounter: Secondary | ICD-10-CM | POA: Diagnosis not present

## 2021-01-18 DIAGNOSIS — I2699 Other pulmonary embolism without acute cor pulmonale: Secondary | ICD-10-CM | POA: Diagnosis not present

## 2021-01-18 LAB — BASIC METABOLIC PANEL
Anion gap: 11 (ref 5–15)
BUN: 100 mg/dL — ABNORMAL HIGH (ref 8–23)
CO2: 21 mmol/L — ABNORMAL LOW (ref 22–32)
Calcium: 8 mg/dL — ABNORMAL LOW (ref 8.9–10.3)
Chloride: 108 mmol/L (ref 98–111)
Creatinine, Ser: 3.63 mg/dL — ABNORMAL HIGH (ref 0.61–1.24)
GFR, Estimated: 17 mL/min — ABNORMAL LOW (ref >60)
Glucose, Bld: 198 mg/dL — ABNORMAL HIGH (ref 70–99)
Potassium: 4.5 mmol/L (ref 3.5–5.1)
Sodium: 140 mmol/L (ref 135–145)

## 2021-01-18 LAB — RENAL FUNCTION PANEL
Albumin: 1.9 g/dL — ABNORMAL LOW (ref 3.5–5.0)
Anion gap: 12 (ref 5–15)
BUN: 120 mg/dL — ABNORMAL HIGH (ref 8–23)
CO2: 18 mmol/L — ABNORMAL LOW (ref 22–32)
Calcium: 8 mg/dL — ABNORMAL LOW (ref 8.9–10.3)
Chloride: 107 mmol/L (ref 98–111)
Creatinine, Ser: 4.47 mg/dL — ABNORMAL HIGH (ref 0.61–1.24)
GFR, Estimated: 13 mL/min — ABNORMAL LOW (ref >60)
Glucose, Bld: 261 mg/dL — ABNORMAL HIGH (ref 70–99)
Phosphorus: 6.5 mg/dL — ABNORMAL HIGH (ref 2.5–4.6)
Potassium: 4 mmol/L (ref 3.5–5.1)
Sodium: 137 mmol/L (ref 135–145)

## 2021-01-18 LAB — CBC
HCT: 25.1 % — ABNORMAL LOW (ref 39.0–52.0)
Hemoglobin: 7.9 g/dL — ABNORMAL LOW (ref 13.0–17.0)
MCH: 26.2 pg (ref 26.0–34.0)
MCHC: 31.5 g/dL (ref 30.0–36.0)
MCV: 83.4 fL (ref 80.0–100.0)
Platelets: 499 10*3/uL — ABNORMAL HIGH (ref 150–400)
RBC: 3.01 MIL/uL — ABNORMAL LOW (ref 4.22–5.81)
RDW: 18.9 % — ABNORMAL HIGH (ref 11.5–15.5)
WBC: 10.2 10*3/uL (ref 4.0–10.5)
nRBC: 0 % (ref 0.0–0.2)

## 2021-01-18 LAB — GLUCOSE, CAPILLARY
Glucose-Capillary: 219 mg/dL — ABNORMAL HIGH (ref 70–99)
Glucose-Capillary: 196 mg/dL — ABNORMAL HIGH (ref 70–99)
Glucose-Capillary: 183 mg/dL — ABNORMAL HIGH (ref 70–99)
Glucose-Capillary: 204 mg/dL — ABNORMAL HIGH (ref 70–99)
Glucose-Capillary: 227 mg/dL — ABNORMAL HIGH (ref 70–99)
Glucose-Capillary: 188 mg/dL — ABNORMAL HIGH (ref 70–99)

## 2021-01-18 LAB — POCT I-STAT 7, (LYTES, BLD GAS, ICA,H+H)
Acid-base deficit: 4 mmol/L — ABNORMAL HIGH (ref 0.0–2.0)
Bicarbonate: 21.8 mmol/L (ref 20.0–28.0)
Calcium, Ion: 1.15 mmol/L (ref 1.15–1.40)
HCT: 24 % — ABNORMAL LOW (ref 39.0–52.0)
Hemoglobin: 8.2 g/dL — ABNORMAL LOW (ref 13.0–17.0)
O2 Saturation: 91 %
Potassium: 4.6 mmol/L (ref 3.5–5.1)
Sodium: 142 mmol/L (ref 135–145)
TCO2: 23 mmol/L (ref 22–32)
pCO2 arterial: 42 mmHg (ref 32.0–48.0)
pH, Arterial: 7.323 — ABNORMAL LOW (ref 7.350–7.450)
pO2, Arterial: 65 mmHg — ABNORMAL LOW (ref 83.0–108.0)

## 2021-01-18 LAB — PHOSPHORUS
Phosphorus: 6.3 mg/dL — ABNORMAL HIGH (ref 2.5–4.6)
Phosphorus: 6.8 mg/dL — ABNORMAL HIGH (ref 2.5–4.6)

## 2021-01-18 LAB — APTT: aPTT: 53 s — ABNORMAL HIGH (ref 24–36)

## 2021-01-18 LAB — MAGNESIUM
Magnesium: 2.4 mg/dL (ref 1.7–2.4)
Magnesium: 2.5 mg/dL — ABNORMAL HIGH (ref 1.7–2.4)

## 2021-01-18 MED ORDER — HYDRALAZINE HCL 20 MG/ML IJ SOLN
10.0000 mg | INTRAMUSCULAR | Status: DC | PRN
  Administered 2021-01-19 – 2021-01-24 (×7): 10 mg via INTRAVENOUS
  Filled 2021-01-18 (×8): qty 1

## 2021-01-18 MED ORDER — HYDRALAZINE HCL 50 MG PO TABS
50.0000 mg | ORAL_TABLET | Freq: Three times a day (TID) | ORAL | Status: DC
  Administered 2021-01-18: 50 mg
  Filled 2021-01-18: qty 1

## 2021-01-18 MED ORDER — INSULIN GLARGINE-YFGN 100 UNIT/ML ~~LOC~~ SOLN
50.0000 [IU] | Freq: Two times a day (BID) | SUBCUTANEOUS | Status: DC
  Administered 2021-01-18 – 2021-01-29 (×22): 50 [IU] via SUBCUTANEOUS
  Filled 2021-01-18 (×25): qty 0.5

## 2021-01-18 MED ORDER — ATROPINE SULFATE 1 MG/10ML IJ SOSY
PREFILLED_SYRINGE | INTRAMUSCULAR | Status: AC
  Filled 2021-01-18: qty 20

## 2021-01-18 MED ORDER — ARTIFICIAL TEARS OPHTHALMIC OINT
TOPICAL_OINTMENT | OPHTHALMIC | Status: DC | PRN
  Administered 2021-01-19 – 2021-01-27 (×3): 1 via OPHTHALMIC
  Filled 2021-01-18 (×2): qty 3.5

## 2021-01-18 MED ORDER — ATROPINE SULFATE 1 MG/ML IJ SOLN
1.0000 mg | Freq: Once | INTRAMUSCULAR | Status: AC
  Administered 2021-01-18: 1 mg via INTRAVENOUS

## 2021-01-18 NOTE — Progress Notes (Signed)
Angel [name] for bivalirudin Indication: atrial fibrillation, DVT, PE 8/23   No Known Allergies  Patient Measurements: Height: 6\' 2"  (188 cm) Weight: (!) 137.1 kg (302 lb 4 oz) IBW/kg (Calculated) : 82.2 Heparin Dosing Weight: 107kg  Vital Signs: Temp: 97.5 F (36.4 C) (09/02 0800) Temp Source: Axillary (09/02 0800) BP: 131/57 (09/02 1120) Pulse Rate: 85 (09/02 1000)  Labs: Recent Labs    01/16/21 0536 01/16/21 1309 01/17/21 0538 01/17/21 1007 01/17/21 1610 01/17/21 1856 01/18/21 0444  HGB 7.7*  --  7.7*  --   --   --  7.9*  HCT 25.4*  --  25.0*  --   --   --  25.1*  PLT 542*  --  591*  --   --   --  499*  APTT 77*   < > 66* 58*  --  51* 53*  CREATININE 4.80*   < > 5.40*  --  5.50*  --  4.47*   < > = values in this interval not displayed.     Estimated Creatinine Clearance: 22.7 mL/min (A) (by C-G formula based on SCr of 4.47 mg/dL (H)).   Assessment: 38 YOM presenting s/p fall with TBI/SAH and facial fx, in afib started on amiodarone and now cleared per trauma for full dose anticoagulation. 8/23 patient found to have small acute bilateral PE and age-indeterminate LUE DVT. Pharmacy consulted to dose bivalirudin per Trauma.  Given recent head bleed, will aim for middle of therapeutic range aptt and watch closely for signs and symptoms of bleeding.   aPTT this morning remains therapeutic (aPTT 53, goal of 50-65), CBC low but stable - no active bleeding noted.   Goal of Therapy:  Aptt goal ~50-65s per discussion with Trauma  Monitor platelets by anticoagulation protocol: Yes   Plan:  - Continue Bivalirudin at 0.02 mg/kg/hr - Therapeutic levels x 2, will switch to daily aPTT monitoring - Will continue to monitor for any signs/symptoms of bleeding  Thank you for allowing pharmacy to be a part of this patient's care.  Alycia Rossetti, PharmD, BCPS Clinical Pharmacist Clinical phone for 01/18/2021: [phone] 01/18/2021 11:30 AM    **Pharmacist phone directory can now be found on [email or web address] ([email or web address]).  Listed under [place].

## 2021-01-18 NOTE — Progress Notes (Signed)
Patient ID: Angel [name], male   DOB: [birth year], 70 y.o.   MRN: [id number] S: tolerating crrt. No acute events. Tolerating uf removal rate of around 100cc/hr O:BP (!) 152/68   Pulse 89   Temp (!) 97.5 F (36.4 C) (Axillary)   Resp 18   Ht 6' 2"  (1.88 m)   Wt (!) 137.1 kg   SpO2 95%   BMI 38.81 kg/m   Intake/Output Summary (Last 24 hours) at 01/18/2021 0954 Last data filed at 01/18/2021 0944 Gross per 24 hour  Intake 3401.29 ml  Output 4556 ml  Net -1154.71 ml   Intake/Output: I/O last 3 completed shifts: In: 5373.7 [P.O.:60; I.V.:1401.7; KJ/IZ:1281] Out: 1886 [Urine:550; Other:3112; Stool:475]  Intake/Output this shift:  Total I/O In: 389.6 [I.V.:59.6; NG/GT:330] Out: 619 [Urine:50; Other:569] Weight change:  Gen:on vent via trach, sedated CVS: IRR, no rub Resp:decreased BS bilaterally, trach Abd:+BS, soft Ext: 3+ anasarca  Recent Labs  Lab 01/12/21 0552 01/13/21 0500 01/14/21 0500 01/15/21 0201 01/16/21 0536 01/16/21 1809 01/17/21 0538 01/17/21 1610 01/18/21 0444  NA 147*   < > 147* 146* 146* 147* 145 147* 137  K 3.6   < > 3.1* 3.2* 3.0* 3.9 3.7 4.0 4.0  CL 118*   < > 118* 117* 116* 115* 115* 116* 107  CO2 18*   < > 16* 15* 17* 16* 16* 17* 18*  GLUCOSE 71   < > 69* 150* 311* 220* 211* 156* 261*  BUN 124*   < > 131* 127* 128* 134* 143* 149* 120*  CREATININE 4.49*   < > 5.04* 4.90* 4.80* 5.09* 5.40* 5.50* 4.47*  ALBUMIN  --   --   --  1.7* 1.8*  --  1.9* 1.9* 1.9*  CALCIUM 8.0*   < > 8.0* 8.2* 8.1* 8.2* 8.2* 8.0* 8.0*  PHOS 6.6*  --   --  7.9* 6.9*  --  6.5*  6.7* 7.2* 6.3*  6.5*   < > = values in this interval not displayed.   Liver Function Tests: Recent Labs  Lab 01/17/21 0538 01/17/21 1610 01/18/21 0444  ALBUMIN 1.9* 1.9* 1.9*   No results for input(s): LIPASE, AMYLASE in the last 168 hours. No results for input(s): AMMONIA in the last 168 hours. CBC: Recent Labs  Lab 01/14/21 0500 01/15/21 0201 01/16/21 0536 01/17/21 0538 01/18/21 0444  WBC  9.7 10.1 9.6 10.3 10.2  HGB 7.7* 7.9* 7.7* 7.7* 7.9*  HCT 25.0* 25.9* 25.4* 25.0* 25.1*  MCV 83.6 85.5 85.2 83.6 83.4  PLT 518* 572* 542* 591* 499*   Cardiac Enzymes: No results for input(s): CKTOTAL, CKMB, CKMBINDEX, TROPONINI in the last 168 hours. CBG: Recent Labs  Lab 01/17/21 1520 01/17/21 2004 01/17/21 2349 01/18/21 0308 01/18/21 0807  GLUCAP 120* 156* 195* 219* 196*    Iron Studies: No results for input(s): IRON, TIBC, TRANSFERRIN, FERRITIN in the last 72 hours. Studies/Results: DG CHEST PORT 1 VIEW  Result Date: 01/17/2021 CLINICAL DATA:  Dialysis catheter placement. EXAM: PORTABLE CHEST 1 VIEW COMPARISON:  January 16, 2021. FINDINGS: Stable cardiomegaly. Tracheostomy tube is unchanged in position. Feeding tube is seen entering stomach. Interval placement of left subclavian catheter with distal tip in expected position of the SVC. Right-sided PICC line is unchanged. No pneumothorax is noted. Right basilar opacity is noted concerning for atelectasis and effusion. Bony thorax is unremarkable. IMPRESSION: Interval placement of left subclavian catheter with distal tip in expected position of the SVC. No pneumothorax is noted. These results were called by telephone at  the time of interpretation on 01/17/2021 at 1:20 pm to provider [facility] , who verbally acknowledged these results. Electronically Signed   By: Marijo Conception M.D.   On: 01/17/2021 13:20   DG Abd Portable 1V  Result Date: 01/16/2021 CLINICAL DATA:  Check feeding catheter placement EXAM: PORTABLE ABDOMEN - 1 VIEW COMPARISON:  None. FINDINGS: Weighted feeding catheter is noted in the distal aspect of the stomach. Nonobstructive bowel gas pattern is noted. IMPRESSION: Feeding catheter within the distal stomach. Electronically Signed   By: Inez Catalina M.D.   On: 01/16/2021 14:33    acetaminophen  1,000 mg Per Tube Q6H   chlorhexidine gluconate (MEDLINE KIT)  15 mL Mouth Rinse BID   Chlorhexidine Gluconate Cloth  6 each  Topical Q0600   docusate  100 mg Per Tube BID   feeding supplement (PROSource TF)  90 mL Per Tube BID   free water  200 mL Per Tube Q4H   guaiFENesin  10 mL Per Tube Q4H   hydrALAZINE  25 mg Per Tube Q8H   insulin aspart  0-20 Units Subcutaneous Q4H   insulin aspart  10 Units Subcutaneous Q4H   insulin glargine-yfgn  43 Units Subcutaneous BID   mouth rinse  15 mL Mouth Rinse 10 times per day   methocarbamol  1,000 mg Per Tube Q8H   metoprolol tartrate  50 mg Per Tube Q6H   pantoprazole sodium  40 mg Per Tube Daily   polyethylene glycol  17 g Per Tube Daily   QUEtiapine  100 mg Per Tube BID   senna  1 tablet Per Tube Daily   sodium bicarbonate  1,300 mg Per Tube BID   sodium chloride flush  10-40 mL Intracatheter Q12H    BMET    Component Value Date/Time   NA 137 01/18/2021 0444   K 4.0 01/18/2021 0444   CL 107 01/18/2021 0444   CO2 18 (L) 01/18/2021 0444   GLUCOSE 261 (H) 01/18/2021 0444   BUN 120 (H) 01/18/2021 0444   CREATININE 4.47 (H) 01/18/2021 0444   CALCIUM 8.0 (L) 01/18/2021 0444   GFRNONAA 13 (L) 01/18/2021 0444   CBC    Component Value Date/Time   WBC 10.2 01/18/2021 0444   RBC 3.01 (L) 01/18/2021 0444   HGB 7.9 (L) 01/18/2021 0444   HCT 25.1 (L) 01/18/2021 0444   PLT 499 (H) 01/18/2021 0444   MCV 83.4 01/18/2021 0444   MCH 26.2 01/18/2021 0444   MCHC 31.5 01/18/2021 0444   RDW 18.9 (H) 01/18/2021 0444    Assessment/ Plan: AKI - Multifactorial including ischemic ATN in setting of hypotensive episode/ sepsis earlier in hosp stay complicated by contrast injury.  BP's stable now.  Nonoliguric, patient making urine.  UNa 26, had some proteinuria on UA earlier in hospitalization and albumin of 1.7.  Will recheck UA and order 24 hour urine protein 2.3 grams (could be a sequelae of AKI).   Marked drop off in UOP overnight with rising BUN/Cr.  Discussed case with Dr. [name] and agree with plan to initiate CRRT with UF as tolerated. Avoid nephrotoxic agents such  as IV contrast, NSAIDs/Cox II I's, and Fleets enemas as will worsen renal failure. Renally dose meds. CRRT started 9/1, LIJ trialysis placed 9/1. 4k/2.5Ca dialysate at 1500 ml/hr, 4K/2.5Ca for pre and post filter replacement fluids at 300 ml/hr.  No heparin as he is on bivalirudin.  Increase UF removal rate to 150-200cc/hr Fall/ bilat SAH - no surgery required, fall  was on 12/28/20 Resp failure - vent support since admission Hypernatremia - improved with free water Resp infection - grew strep/ pseudomonas from resp secretions and was rx'd w/ IV abx from 8/17 to current (only 1 dose IV vanc given on 8/17 then dc'd).  Atrial fib / RVR - in sinus rhythm now Anasarca - increasing UF to 150-200cc/hr (hemodynamics are stable). Low alb, 24 hr urine for protein ~2.3g. Metabolic acidosis - improving with cvvhd  Gean Quint, MD [facility]

## 2021-01-18 NOTE — Plan of Care (Signed)
  Problem: Nutrition: Goal: Adequate nutrition will be maintained Outcome: Progressing   Problem: Elimination: Goal: Will not experience complications related to bowel motility Outcome: Progressing   Problem: Clinical Measurements: Goal: Cardiovascular complication will be avoided Outcome: Not Progressing   Problem: Activity: Goal: Risk for activity intolerance will decrease Outcome: Not Progressing

## 2021-01-18 NOTE — Progress Notes (Signed)
Trauma/Critical Care Follow Up Note  Subjective:    Overnight Issues:   Objective:  Vital signs for last 24 hours: Temp:  [97.5 F (36.4 C)-98.4 F (36.9 C)] 97.5 F (36.4 C) (09/02 0800) Pulse Rate:  [78-116] 89 (09/02 0931) Resp:  [15-29] 18 (09/02 0900) BP: (111-152)/(67-72) 152/68 (09/02 0931) SpO2:  [89 %-96 %] 95 % (09/02 0900) Arterial Line BP: (102-193)/(58-89) 145/68 (09/02 0900) FiO2 (%):  [40 %-50 %] 50 % (09/02 0734) Weight:  [137.1 kg] 137.1 kg (09/01 1400)  Hemodynamic parameters for last 24 hours: CVP:  [13 mmHg-67 mmHg] 19 mmHg  Intake/Output from previous day: 09/01 0701 - 09/02 0700 In: 3470.8 [P.O.:60; I.V.:785.8; NG/GT:2625] Out: 3937 [Urine:350; Stool:475]  Intake/Output this shift: Total I/O In: 475.2 [I.V.:80.2; NG/GT:395] Out: 880 [Urine:50; Other:830]  Vent settings for last 24 hours: Vent Mode: PSV;CPAP FiO2 (%):  [40 %-50 %] 50 % Set Rate:  [20 bmp] 20 bmp Vt Set:  [650 mL] 650 mL PEEP:  [5 cmH20] 5 cmH20 Pressure Support:  [10 cmH20] 10 cmH20 Plateau Pressure:  [15 cmH20-31 cmH20] 15 cmH20  Physical Exam:  Gen: comfortable, no distress Neuro: not f/c HEENT: PERRL Neck: trached CV: AF Pulm: unlabored breathing Abd: soft, NT GU: oliguric Extr: wwp, 3+ edema   Results for orders placed or performed during the hospital encounter of 12/28/20 (from the past 24 hour(s))  Glucose, capillary     Status: Abnormal   Collection Time: 01/17/21 11:04 AM  Result Value Ref Range   Glucose-Capillary 152 (H) 70 - 99 mg/dL  Glucose, capillary     Status: Abnormal   Collection Time: 01/17/21  3:20 PM  Result Value Ref Range   Glucose-Capillary 120 (H) 70 - 99 mg/dL  Renal function panel (daily at 1600)     Status: Abnormal   Collection Time: 01/17/21  4:10 PM  Result Value Ref Range   Sodium 147 (H) 135 - 145 mmol/L   Potassium 4.0 3.5 - 5.1 mmol/L   Chloride 116 (H) 98 - 111 mmol/L   CO2 17 (L) 22 - 32 mmol/L   Glucose, Bld 156 (H) 70  - 99 mg/dL   BUN 149 (H) 8 - 23 mg/dL   Creatinine, Ser 5.50 (H) 0.61 - 1.24 mg/dL   Calcium 8.0 (L) 8.9 - 10.3 mg/dL   Phosphorus 7.2 (H) 2.5 - 4.6 mg/dL   Albumin 1.9 (L) 3.5 - 5.0 g/dL   GFR, Estimated 10 (L) >60 mL/min   Anion gap 14 5 - 15  APTT     Status: Abnormal   Collection Time: 01/17/21  6:56 PM  Result Value Ref Range   aPTT 51 (H) 24 - 36 seconds  Glucose, capillary     Status: Abnormal   Collection Time: 01/17/21  8:04 PM  Result Value Ref Range   Glucose-Capillary 156 (H) 70 - 99 mg/dL   Comment 1 Notify RN    Comment 2 Document in Chart   Glucose, capillary     Status: Abnormal   Collection Time: 01/17/21 11:49 PM  Result Value Ref Range   Glucose-Capillary 195 (H) 70 - 99 mg/dL   Comment 1 Notify RN    Comment 2 Document in Chart   Glucose, capillary     Status: Abnormal   Collection Time: 01/18/21  3:08 AM  Result Value Ref Range   Glucose-Capillary 219 (H) 70 - 99 mg/dL   Comment 1 Notify RN    Comment 2 Document in Chart  CBC     Status: Abnormal   Collection Time: 01/18/21  4:44 AM  Result Value Ref Range   WBC 10.2 4.0 - 10.5 K/uL   RBC 3.01 (L) 4.22 - 5.81 MIL/uL   Hemoglobin 7.9 (L) 13.0 - 17.0 g/dL   HCT 25.1 (L) 39.0 - 52.0 %   MCV 83.4 80.0 - 100.0 fL   MCH 26.2 26.0 - 34.0 pg   MCHC 31.5 30.0 - 36.0 g/dL   RDW 18.9 (H) 11.5 - 15.5 %   Platelets 499 (H) 150 - 400 K/uL   nRBC 0.0 0.0 - 0.2 %  APTT     Status: Abnormal   Collection Time: 01/18/21  4:44 AM  Result Value Ref Range   aPTT 53 (H) 24 - 36 seconds  Renal function panel     Status: Abnormal   Collection Time: 01/18/21  4:44 AM  Result Value Ref Range   Sodium 137 135 - 145 mmol/L   Potassium 4.0 3.5 - 5.1 mmol/L   Chloride 107 98 - 111 mmol/L   CO2 18 (L) 22 - 32 mmol/L   Glucose, Bld 261 (H) 70 - 99 mg/dL   BUN 120 (H) 8 - 23 mg/dL   Creatinine, Ser 4.47 (H) 0.61 - 1.24 mg/dL   Calcium 8.0 (L) 8.9 - 10.3 mg/dL   Phosphorus 6.5 (H) 2.5 - 4.6 mg/dL   Albumin 1.9 (L) 3.5 -  5.0 g/dL   GFR, Estimated 13 (L) >60 mL/min   Anion gap 12 5 - 15  Phosphorus     Status: Abnormal   Collection Time: 01/18/21  4:44 AM  Result Value Ref Range   Phosphorus 6.3 (H) 2.5 - 4.6 mg/dL  Magnesium     Status: None   Collection Time: 01/18/21  4:44 AM  Result Value Ref Range   Magnesium 2.4 1.7 - 2.4 mg/dL  Glucose, capillary     Status: Abnormal   Collection Time: 01/18/21  8:07 AM  Result Value Ref Range   Glucose-Capillary 196 (H) 70 - 99 mg/dL    Assessment & Plan: The plan of care was discussed with the bedside nurse for the day, [name], who is in agreement with this plan and no additional concerns were raised.   Present on Admission: **None**    LOS: 21 days   Additional comments:I reviewed the patient's new clinical lab test results.   and I reviewed the patients new imaging test results.    Fall down stairs 8/12   VDRF - guaifenisen, wean, S/P trach 8/29 by Dr. Bobbye Morton. PSV this AM, possible trach collar this PM ID - resp CX now with pseud and enterococcus - completed maxipime/ampicillin 8/31, CT A/P with ascending colitis and distention. Stool studies and C. dif are all negative TBI/SAH/SDH - NSGY c/s, Dr. Annette Stable. Significant frontal lobe injuries. Keppra x7d for sz ppx. Occipital bone fx - NSGY c/s, Dr. Annette Stable Temporal bone fx extending into middle ear - ENT c/s, Dr. Constance Holster Right TM Rupture - ENT c/s, Dr. Constance Holster AFRVR - amio off, appreciate Cardiology, back on cardene, lopressor 50mg  BID, hydral 50q8. No further increase in BP meds for now to allow as much fluid removal as possible.  ABL anemia - stable Bilateral pulmonary embolism - bival AKI - CRRT per Renal today. Optimize fluid removal net 150-200/h today Hx DM2 - resistant SSI, novolog q4, incr glargine to 50u BID Hx HTN - PRN meds FEN - NPO, TF VTE - SCDs, bival gtt Foley -  removed Dispo - ICU  Clinical update provided to wife at bedside.  Critical Care Total Time: 50 minutes  Angel Oka,  MD Trauma & General Surgery Please use [email or web address] to contact on call provider  01/18/2021  *Care during the described time interval was provided by me. I have reviewed this patient's available data, including medical history, events of note, physical examination and test results as part of my evaluation.

## 2021-01-18 NOTE — Progress Notes (Signed)
Occupational Therapy Treatment Patient Details Name: Angel [name] MRN: [id number] DOB: December 19, 1950 Today's Date: 01/18/2021    History of present illness Angel [name] is a 70 y.o. 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 CT showed R temporal and parietal SAH, SAH anterior frontal lobes  bilaterally. Also sustained right occipital skull fracture, temporal bone fx, right TM rupture, bilateral PE. Intubated 8/12, trach'd 8/29. PMH: DM2, HTN   OT comments  Session very limited to retrograde massage and positioning with CRRT running. Pt with eyes open and fixated very dry at this time. Pt mouth open with increased dry appearance. Wife in room without further questions. Wife educated pt presents as Rancho II generalize at this time and limited due to CRRT running. Wife expressed understanding. PT seeing pt prior to OT for LB PROM. Recommend pravalon boots for bil heels. Following for bil UE but continue to hold splints due to skin integrity.    Follow Up Recommendations  CIR    Equipment Recommendations  3 in 1 bedside commode;Wheelchair (measurements OT);Wheelchair cushion (measurements OT);Hospital bed    Recommendations for Other Services      Precautions / Restrictions Precautions Precautions: Fall;Other (comment) Precaution Comments: cortrak vent/ trach CRRT       Mobility Bed Mobility                    Transfers                      Balance                                           ADL either performed or assessed with clinical judgement   ADL                                         General ADL Comments: dependent for all adls.     Vision       Perception     Praxis      Cognition Arousal/Alertness: Lethargic Behavior During Therapy: Flat affect Overall Cognitive Status: Difficult to assess                 Rancho Levels of Cognitive Functioning Rancho [employer] Scales of Cognitive  Functioning: Generalized response               General Comments: not following commands        Exercises Exercises: Other exercises Other Exercises Other Exercises: PROM with retrograde massage for bil hands digits to wrist area. limited on R UE due to Aline/ picc line. and L UE due to CRRT running over arm positioning. Pt with bil hands placed in functional position wiht rolled pillow case   Shoulder Instructions       General Comments VSS    Pertinent Vitals/ Pain       Pain Assessment: No/denies pain  Home Living                                          Prior Functioning/Environment              Frequency  Min 2X/week  Progress Toward Goals  OT Goals(current goals can now be found in the care plan section)  Progress towards OT goals: Progressing toward goals  Acute Rehab OT Goals Patient Stated Goal: unable to state OT Goal Formulation: Patient unable to participate in goal setting Time For Goal Achievement: 01/30/21 Potential to Achieve Goals: Good ADL Goals Additional ADL Goal #1: pt will follow 1 step command 25% of session Additional ADL Goal #2: pt will visually track therapist in room for 25% of session Additional ADL Goal #3: pt will tolerate static sitting max (A) for 10 minutes ( stable VSS)  Plan Discharge plan remains appropriate;Frequency needs to be updated    Co-evaluation                 AM-PAC OT "6 Clicks" Daily Activity     Outcome Measure   Help from another person eating meals?: Total Help from another person taking care of personal grooming?: Total Help from another person toileting, which includes using toliet, bedpan, or urinal?: Total Help from another person bathing (including washing, rinsing, drying)?: Total Help from another person to put on and taking off regular upper body clothing?: Total Help from another person to put on and taking off regular lower body clothing?: Total 6 Click  Score: 6    End of Session Equipment Utilized During Treatment: Oxygen  OT Visit Diagnosis: Unsteadiness on feet (R26.81);Muscle weakness (generalized) (M62.81)   Activity Tolerance Patient tolerated treatment well   Patient Left in bed;with call bell/phone within reach;with bed alarm set;with nursing/sitter in room;with family/visitor present   Nurse Communication Mobility status;Precautions        Time: 6579-0383 OT Time Calculation (min): 9 min  Charges: OT General Charges $OT Visit: 1 Visit OT Treatments $Therapeutic Exercise: 8-22 mins   Angel [name], Angel [name]  Acute Rehabilitation Services Pager: [phone] Office: [phone] .    Jeri Modena 01/18/2021, 3:40 PM

## 2021-01-18 NOTE — Progress Notes (Signed)
Physical Therapy Treatment Patient Details Name: Angel [name] MRN: [id number] DOB: 03-12-1951 Today's Date: 01/18/2021    History of Present Illness Angel [name] is a 70 y.o. 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 CT showed R temporal and parietal SAH, SAH anterior frontal lobes  bilaterally. Also sustained right occipital skull fracture, temporal bone fx, right TM rupture, bilateral PE. Intubated 8/12, trach'd 8/29. PMH: DM2, HTN    PT Comments    Pt on vent support via trach 50% FiO2. Guppy breathing noted. Pt in a fib with HR in 80s. SpO2 in 90s. CRRT started 9/1. Very edematous. PROM and retrograde massage performed BUE/LE. Little to no response to noxious stimuli. Pt noted to be mildly fidgeting in bed toward end of session, minimal active movement noted RUE and LLE. Fixed gaze with eyes slightly open. Will leave d/c recs as CIR to see how pt responds to CRRT.    Follow Up Recommendations  CIR     Equipment Recommendations  Other (comment) (TBD)    Recommendations for Other Services       Precautions / Restrictions Precautions Precautions: Fall;Other (comment) Precaution Comments: trach/vent, watch HR and BP, very edematous, CRRT (started 9/1) Restrictions Weight Bearing Restrictions: No    Mobility  Bed Mobility                    Transfers                    Ambulation/Gait                 Stairs             Wheelchair Mobility    Modified Rankin (Stroke Patients Only)       Balance                                            Cognition Arousal/Alertness: Lethargic Behavior During Therapy: Flat affect Overall Cognitive Status: Difficult to assess                 Rancho Levels of Cognitive Functioning [place] [employer] Scales of Cognitive Functioning: Generalized response               General Comments: not following commands, fixed gaze, no purposeful movement, little to no  response to noxious stimuli      Exercises Other Exercises Other Exercises: PROM BUE/LE, retrograde massage for fluid mobilization    General Comments        Pertinent Vitals/Pain Pain Assessment: Faces Faces Pain Scale: Hurts a little bit Pain Location: noxious stimuli Pain Descriptors / Indicators: Discomfort;Other (Comment) (fidgeting in bed) Pain Intervention(s): Limited activity within patient's tolerance;Monitored during session    Home Living                      Prior Function            PT Goals (current goals can now be found in the care plan section) Acute Rehab PT Goals Patient Stated Goal: unable to state Progress towards PT goals: Not progressing toward goals - comment (decreased responsiveness, now on CRRT)    Frequency    Min 3X/week      PT Plan Current plan remains appropriate    Co-evaluation  AM-PAC PT "6 Clicks" Mobility   Outcome Measure  Help needed turning from your back to your side while in a flat bed without using bedrails?: Total Help needed moving from lying on your back to sitting on the side of a flat bed without using bedrails?: Total Help needed moving to and from a bed to a chair (including a wheelchair)?: Total Help needed standing up from a chair using your arms (e.g., wheelchair or bedside chair)?: Total Help needed to walk in hospital room?: Total Help needed climbing 3-5 steps with a railing? : Total 6 Click Score: 6    End of Session Equipment Utilized During Treatment: Oxygen (vent) Activity Tolerance: Treatment limited secondary to medical complications (Comment) (decreased responsiveness, CRRT) Patient left: in bed;with nursing/sitter in room Nurse Communication: Mobility status PT Visit Diagnosis: Unsteadiness on feet (R26.81);Muscle weakness (generalized) (M62.81);Difficulty in walking, not elsewhere classified (R26.2)     Time: 8280-0349 PT Time Calculation (min) (ACUTE ONLY): 17  min  Charges:  $Therapeutic Exercise: 8-22 mins                     Lorrin Goodell, PT  Office # [phone] Pager [phone]    Lorriane Shire 01/18/2021, 10:05 AM

## 2021-01-18 NOTE — Progress Notes (Signed)
SLP Cancellation Note  Patient Details Name: BENJAMIM HARNISH MRN: [id number] DOB: 14-May-1951   Cancelled treatment:        Pt is not responding to noxious stimulation, guppy breathing per PT note. Not appropriate for ST intervention. Will continue efforts next week   Houston Siren 01/18/2021, 10:16 AM  Orbie Pyo Colvin Caroli.Ed Risk analyst [phone] Office [phone]

## 2021-01-18 NOTE — Progress Notes (Signed)
Called to assess bradycardia by the trauma team.  It appears Mr. Stolp has converted back to sinus rhythm.  Bradycardia required atropine.  Heart rate 30 to 40 bpm.  He is hemodynamically stable.  He had been receiving metoprolol for his atrial fibrillation.  We have now stopped this.  We will also stop hydralazine.  Suspect this is still driven by acidosis as well as overall critical state.  For now no urgent need for pacing as he is hemodynamically stable.  If his heart rate becomes an issue and blood pressure becomes lower would recommend to start with dopamine infusion.  If there is any rhythm change or further decline please notify cardiology on-call.  He could also be evaluated for a temporary pacing wire.  At this time I see no need for given his hemodynamic stability.    Overall he is critically ill in ICU with several comorbidities and would like to avoid pacing wire if able.  [place] T. Audie Box, MD, [place]  [address], [place], [place] [address] [phone]  5:26 PM

## 2021-01-18 NOTE — Progress Notes (Signed)
1533: Pt noted to have acute bradycardia in 30s down to 20s.Hypotensive with MAP in 50s.  1536: 1mg  atropine given, HR brought back up to 40s. MD Lovick called to bedside.   1545: CRRT stopped, blood returned. MD Lovick at bedside.  1625: CRRT resarted with MD Lovick at bedside, at net 0 per MD Lovick.  1715: Per MD Lovick- net 0 until 2100, -50 after 2100 as long as BP tolerates.

## 2021-01-18 NOTE — Progress Notes (Signed)
Cardiology Progress Note  Patient ID: Angel [name] MRN: [id number] DOB: [birth year] Date of Encounter: 01/18/2021  Primary Cardiologist: Werner Lean, MD  Subjective   Chief Complaint: Intubated on vent  HPI: Started on CRRT.  Bradycardia is improving.  In and out of A. fib this morning.  Rates are well controlled.  Not following commands.  ROS:  All other ROS reviewed and negative. Pertinent positives noted in the HPI.     Inpatient Medications  Scheduled Meds:  acetaminophen  1,000 mg Per Tube Q6H   chlorhexidine gluconate (MEDLINE KIT)  15 mL Mouth Rinse BID   Chlorhexidine Gluconate Cloth  6 each Topical Q0600   docusate  100 mg Per Tube BID   feeding supplement (PROSource TF)  90 mL Per Tube BID   free water  200 mL Per Tube Q4H   guaiFENesin  10 mL Per Tube Q4H   hydrALAZINE  25 mg Per Tube Q8H   insulin aspart  0-20 Units Subcutaneous Q4H   insulin aspart  10 Units Subcutaneous Q4H   insulin glargine-yfgn  43 Units Subcutaneous BID   mouth rinse  15 mL Mouth Rinse 10 times per day   methocarbamol  1,000 mg Per Tube Q8H   metoprolol tartrate  50 mg Per Tube Q6H   pantoprazole sodium  40 mg Per Tube Daily   polyethylene glycol  17 g Per Tube Daily   QUEtiapine  100 mg Per Tube BID   senna  1 tablet Per Tube Daily   sodium bicarbonate  1,300 mg Per Tube BID   sodium chloride flush  10-40 mL Intracatheter Q12H   Continuous Infusions:   prismasol BGK 4/2.5 300 mL/hr at 01/18/21 0803    prismasol BGK 4/2.5 300 mL/hr at 01/17/21 1432   sodium chloride     bivalirudin (ANGIOMAX) infusion 0.5 mg/mL (Non-ACS indications) 0.02 mg/kg/hr (01/18/21 1000)   feeding supplement (PIVOT 1.5 CAL) 1,000 mL (01/18/21 1016)   niCARDipine 2.5 mg/hr (01/18/21 1000)   prismasol BGK 4/2.5 1,500 mL/hr at 01/18/21 0548   PRN Meds: Place/Maintain arterial line **AND** sodium chloride, artificial tears, heparin, hydrALAZINE, HYDROmorphone (DILAUDID) injection, ondansetron **OR**  ondansetron (ZOFRAN) IV, oxyCODONE, sodium chloride flush   Vital Signs   Vitals:   01/18/21 0734 01/18/21 0800 01/18/21 0900 01/18/21 0931  BP:    (!) 152/68  Pulse:  79 81 89  Resp:  (!) 25 18   Temp:  (!) 97.5 F (36.4 C)    TempSrc:  Axillary    SpO2: 93% 94% 95%   Weight:      Height:        Intake/Output Summary (Last 24 hours) at 01/18/2021 1026 Last data filed at 01/18/2021 1000 Gross per 24 hour  Intake 3266.2 ml  Output 4817 ml  Net -1550.8 ml   Last 3 Weights 01/17/2021 01/13/2021 01/13/2021  Weight (lbs) 302 lb 4 oz 306 lb 10.6 oz 305 lb 8.9 oz  Weight (kg) 137.1 kg 139.1 kg 138.6 kg      Telemetry  Overnight telemetry shows atrial fibrillation heart rate in the 80s, intermittent sinus rhythm with postconversion pauses which are less than 3 seconds, which I personally reviewed.   Physical Exam   Vitals:   01/18/21 0734 01/18/21 0800 01/18/21 0900 01/18/21 0931  BP:    (!) 152/68  Pulse:  79 81 89  Resp:  (!) 25 18   Temp:  (!) 97.5 F (36.4 C)    TempSrc:  Axillary  SpO2: 93% 94% 95%   Weight:      Height:        Intake/Output Summary (Last 24 hours) at 01/18/2021 1026 Last data filed at 01/18/2021 1000 Gross per 24 hour  Intake 3266.2 ml  Output 4817 ml  Net -1550.8 ml    Last 3 Weights 01/17/2021 01/13/2021 01/13/2021  Weight (lbs) 302 lb 4 oz 306 lb 10.6 oz 305 lb 8.9 oz  Weight (kg) 137.1 kg 139.1 kg 138.6 kg    Body mass index is 38.81 kg/m.   General: Ill-appearing Head: Atraumatic, normal size  Eyes: PEERLA, EOMI  Neck: Supple, JVD difficult to assess due to neck adiposity Endocrine: No thryomegaly Cardiac: Normal S1, S2; irregular rhythm, no murmurs Lungs: Diminished breath sounds Abd: Soft, nontender, no hepatomegaly  Ext: 2+ pitting edema up to the thighs Musculoskeletal: No deformities Skin: Warm and dry, no rashes   Neuro: Intubated, not following commands  Labs  High Sensitivity Troponin:   Recent Labs  Lab 01/10/21 1346  01/10/21 1518  TROPONINIHS 52* 56*     Cardiac EnzymesNo results for input(s): TROPONINI in the last 168 hours. No results for input(s): TROPIPOC in the last 168 hours.  Chemistry Recent Labs  Lab 01/17/21 0538 01/17/21 1610 01/18/21 0444  NA 145 147* 137  K 3.7 4.0 4.0  CL 115* 116* 107  CO2 16* 17* 18*  GLUCOSE 211* 156* 261*  BUN 143* 149* 120*  CREATININE 5.40* 5.50* 4.47*  CALCIUM 8.2* 8.0* 8.0*  ALBUMIN 1.9* 1.9* 1.9*  GFRNONAA 11* 10* 13*  ANIONGAP _0 Hematology Recent Labs  Lab 01/16/21 0536 01/17/21 0538 01/18/21 0444  WBC 9.6 10.3 10.2  RBC 2.98* 2.99* 3.01*  HGB 7.7* 7.7* 7.9*  HCT 25.4* 25.0* 25.1*  MCV 85.2 83.6 83.4  MCH 25.8* 25.8* 26.2  MCHC 30.3 30.8 31.5  RDW 18.1* 18.6* 18.9*  PLT 542* 591* 499*   BNPNo results for input(s): BNP, PROBNP in the last 168 hours.  DDimer No results for input(s): DDIMER in the last 168 hours.   Radiology  DG CHEST PORT 1 VIEW  Result Date: 01/17/2021 CLINICAL DATA:  Dialysis catheter placement. EXAM: PORTABLE CHEST 1 VIEW COMPARISON:  January 16, 2021. FINDINGS: Stable cardiomegaly. Tracheostomy tube is unchanged in position. Feeding tube is seen entering stomach. Interval placement of left subclavian catheter with distal tip in expected position of the SVC. Right-sided PICC line is unchanged. No pneumothorax is noted. Right basilar opacity is noted concerning for atelectasis and effusion. Bony thorax is unremarkable. IMPRESSION: Interval placement of left subclavian catheter with distal tip in expected position of the SVC. No pneumothorax is noted. These results were called by telephone at the time of interpretation on 01/17/2021 at 1:20 pm to provider [facility] , who verbally acknowledged these results. Electronically Signed   By: Marijo Conception M.D.   On: 01/17/2021 13:20   DG Abd Portable 1V  Result Date: 01/16/2021 CLINICAL DATA:  Check feeding catheter placement EXAM: PORTABLE ABDOMEN - 1 VIEW  COMPARISON:  None. FINDINGS: Weighted feeding catheter is noted in the distal aspect of the stomach. Nonobstructive bowel gas pattern is noted. IMPRESSION: Feeding catheter within the distal stomach. Electronically Signed   By: Inez Catalina M.D.   On: 01/16/2021 14:33    Cardiac Studies  TTE 01/10/2021  1. Left ventricular ejection fraction, by estimation, is 60 to 65%. The  left ventricle has normal function. The left ventricle has no regional  wall motion abnormalities. There is mild left ventricular hypertrophy.  Left ventricular diastolic parameters  were normal.   2. Right ventricular systolic function is mildly reduced. The right  ventricular size is not well visualized.   3. The mitral valve is normal in structure. Trivial mitral valve  regurgitation. No evidence of mitral stenosis.   4. The aortic valve is tricuspid. Aortic valve regurgitation is not  visualized. Mild aortic valve sclerosis is present, with no evidence of  aortic valve stenosis.   Patient Profile  LANNIS LICHTENWALNER is a 70 y.o. male with hypertension, diabetes, hyperlipidemia who was admitted on 12/28/2020 with fall and subarachnoid hemorrhage.  Course has been very complicated with multifocal pneumonia/septic shock diagnosis on 01/02/2021.  He did develop atrial fibrillation on 01/04/2021 likely secondary to pneumonia and septic shock..  Course is also been complicated by bilateral pulmonary emboli developed on 01/09/2021.  Cardiology initially had signed off but they were reconsulted in the setting of bradycardia/afib.   Assessment & Plan   #Paroxysmal atrial fibrillation -In and out of A. fib since 01/04/2021. -Driven by critical illness, volume overload, multiorgan system failure, multifocal pneumonia, bilateral pulmonary emboli. -In A. fib this morning but had bouts of sinus rhythm. -Started on CRRT and bradycardia has improved.  Suspect bradycardia was driven by acidosis. -Continue metoprolol tartrate 50 mg every 6  hours.  If unstable can transition back to amiodarone.  Suspect bradycardia will improve with acidosis treatment by CRT. -On bivalirudin drip.  #Sinus bradycardia -Was having sinus bradycardia as well as A. fib with SVR. -No further episodes with initiation of CRRT.  I suspect this was all acidosis driven. -Tolerating beta-blocker okay.  We will continue this. -Echo shows normal LV function.  #Fall with subarachnoid hemorrhage -Per trauma team  #Bilateral pulmonary emboli -On bivalirudin  #AKI/metabolic acidosis -Per renal  #Multifocal pneumonia -Completed antibiotics  #Hypertension -would titrate up hydralazine to avoid IVF from nicardipine drip.   Cardiology team will follow remotely over the weekend.  We will follow on Monday.   For questions or updates, please contact [place] Please consult [email or web address] for contact info under   Time Spent with Patient: I have spent a total of 25 minutes with patient reviewing hospital notes, telemetry, EKGs, labs and examining the patient as well as establishing an assessment and plan that was discussed with the patient.  > 50% of time was spent in direct patient care.    Signed, Addison Naegeli. Audie Box, MD, [place]  01/18/2021 10:26 AM

## 2021-01-18 NOTE — Progress Notes (Signed)
Pt was placed on trach collar per Dr. Bobbye Morton and is tolerating well at this time. Pt is stable at this time. RT will monitor.

## 2021-01-18 NOTE — Progress Notes (Signed)
Patient seen and examined for sudden onset bradycardia to 51. Atropine given with response of HR to 60s. Subsequent decline in HR to high 30s, low 40s. BP maintained normal during this time. Blood from CRRT returned to patient. BMP/mag/phos sent as well as ABG and EKG. EKG showed junctional bradycardia and labs not wildly abnormal from previous. Cardiology notified, metoprolol and hydralazine discontinued, plan for prn IV admin if hypertension occurs. If persistent or associated hypotension, will consider dopamine and/or transvenous pacing. CRRT restarted at absolute zero, which patient tolerated, then transitioned to net zero. Discussed with renal and plan for net zero x4h, then net negative 50/h. Renal raises some concern for intracranial process as inciting etiology. I think this is low likelihood and discussed with NSGY, who agrees. Clinical update provided to patient's family. TVP is high risk given current arrhythmia, current AC, and history of pulmonary embolism. If arrhythmia continues, patient is at risk for fatal arrhythmia. These risks were communicated to the patient's wife and son and clear communication of the patient's current multi-system organ failure. Recommended consideration of decision should the indication present for TVP overnight. Discussed current code status as well, patient to remain full code. Advised family that I will consult palliative care in this setting of critical illness.   Additional critical care time: 112min  Jesusita Oka, MD General and [facility]

## 2021-01-19 DIAGNOSIS — I480 Paroxysmal atrial fibrillation: Secondary | ICD-10-CM | POA: Diagnosis not present

## 2021-01-19 DIAGNOSIS — Z515 Encounter for palliative care: Secondary | ICD-10-CM | POA: Diagnosis not present

## 2021-01-19 DIAGNOSIS — I10 Essential (primary) hypertension: Secondary | ICD-10-CM

## 2021-01-19 DIAGNOSIS — W19XXXA Unspecified fall, initial encounter: Secondary | ICD-10-CM

## 2021-01-19 DIAGNOSIS — J9601 Acute respiratory failure with hypoxia: Secondary | ICD-10-CM

## 2021-01-19 DIAGNOSIS — Z7189 Other specified counseling: Secondary | ICD-10-CM | POA: Diagnosis not present

## 2021-01-19 DIAGNOSIS — I495 Sick sinus syndrome: Secondary | ICD-10-CM

## 2021-01-19 DIAGNOSIS — J9602 Acute respiratory failure with hypercapnia: Secondary | ICD-10-CM

## 2021-01-19 DIAGNOSIS — R001 Bradycardia, unspecified: Secondary | ICD-10-CM | POA: Diagnosis not present

## 2021-01-19 DIAGNOSIS — R6521 Severe sepsis with septic shock: Secondary | ICD-10-CM

## 2021-01-19 DIAGNOSIS — A419 Sepsis, unspecified organism: Secondary | ICD-10-CM | POA: Diagnosis not present

## 2021-01-19 LAB — POCT I-STAT, CHEM 8
BUN: 108 mg/dL — ABNORMAL HIGH (ref 8–23)
Calcium, Ion: 1.11 mmol/L — ABNORMAL LOW (ref 1.15–1.40)
Chloride: 107 mmol/L (ref 98–111)
Creatinine, Ser: 3.8 mg/dL — ABNORMAL HIGH (ref 0.61–1.24)
Glucose, Bld: 208 mg/dL — ABNORMAL HIGH (ref 70–99)
HCT: 23 % — ABNORMAL LOW (ref 39.0–52.0)
Hemoglobin: 7.8 g/dL — ABNORMAL LOW (ref 13.0–17.0)
Potassium: 4.5 mmol/L (ref 3.5–5.1)
Sodium: 141 mmol/L (ref 135–145)
TCO2: 20 mmol/L — ABNORMAL LOW (ref 22–32)

## 2021-01-19 LAB — CBC
HCT: 27.5 % — ABNORMAL LOW (ref 39.0–52.0)
Hemoglobin: 8.7 g/dL — ABNORMAL LOW (ref 13.0–17.0)
MCH: 26.6 pg (ref 26.0–34.0)
MCHC: 31.6 g/dL (ref 30.0–36.0)
MCV: 84.1 fL (ref 80.0–100.0)
Platelets: 530 10*3/uL — ABNORMAL HIGH (ref 150–400)
RBC: 3.27 MIL/uL — ABNORMAL LOW (ref 4.22–5.81)
RDW: 19.1 % — ABNORMAL HIGH (ref 11.5–15.5)
WBC: 13.3 10*3/uL — ABNORMAL HIGH (ref 4.0–10.5)
nRBC: 0.4 % — ABNORMAL HIGH (ref 0.0–0.2)

## 2021-01-19 LAB — RENAL FUNCTION PANEL
Albumin: 2 g/dL — ABNORMAL LOW (ref 3.5–5.0)
Albumin: 1.9 g/dL — ABNORMAL LOW (ref 3.5–5.0)
Anion gap: 11 (ref 5–15)
Anion gap: 11 (ref 5–15)
BUN: 80 mg/dL — ABNORMAL HIGH (ref 8–23)
BUN: 79 mg/dL — ABNORMAL HIGH (ref 8–23)
CO2: 22 mmol/L (ref 22–32)
CO2: 23 mmol/L (ref 22–32)
Calcium: 8.4 mg/dL — ABNORMAL LOW (ref 8.9–10.3)
Calcium: 8.3 mg/dL — ABNORMAL LOW (ref 8.9–10.3)
Chloride: 106 mmol/L (ref 98–111)
Chloride: 104 mmol/L (ref 98–111)
Creatinine, Ser: 2.82 mg/dL — ABNORMAL HIGH (ref 0.61–1.24)
Creatinine, Ser: 2.65 mg/dL — ABNORMAL HIGH (ref 0.61–1.24)
GFR, Estimated: 23 mL/min — ABNORMAL LOW (ref >60)
GFR, Estimated: 25 mL/min — ABNORMAL LOW (ref >60)
Glucose, Bld: 149 mg/dL — ABNORMAL HIGH (ref 70–99)
Glucose, Bld: 126 mg/dL — ABNORMAL HIGH (ref 70–99)
Phosphorus: 4.7 mg/dL — ABNORMAL HIGH (ref 2.5–4.6)
Phosphorus: 4.5 mg/dL (ref 2.5–4.6)
Potassium: 4.2 mmol/L (ref 3.5–5.1)
Potassium: 3.8 mmol/L (ref 3.5–5.1)
Sodium: 139 mmol/L (ref 135–145)
Sodium: 138 mmol/L (ref 135–145)

## 2021-01-19 LAB — GLUCOSE, CAPILLARY
Glucose-Capillary: 151 mg/dL — ABNORMAL HIGH (ref 70–99)
Glucose-Capillary: 127 mg/dL — ABNORMAL HIGH (ref 70–99)
Glucose-Capillary: 123 mg/dL — ABNORMAL HIGH (ref 70–99)
Glucose-Capillary: 124 mg/dL — ABNORMAL HIGH (ref 70–99)
Glucose-Capillary: 109 mg/dL — ABNORMAL HIGH (ref 70–99)
Glucose-Capillary: 122 mg/dL — ABNORMAL HIGH (ref 70–99)

## 2021-01-19 LAB — TRIGLYCERIDES: Triglycerides: 133 mg/dL (ref <150)

## 2021-01-19 LAB — PHOSPHORUS: Phosphorus: 4.6 mg/dL (ref 2.5–4.6)

## 2021-01-19 LAB — MAGNESIUM: Magnesium: 2.5 mg/dL — ABNORMAL HIGH (ref 1.7–2.4)

## 2021-01-19 LAB — APTT: aPTT: 55 s — ABNORMAL HIGH (ref 24–36)

## 2021-01-19 MED ORDER — HYDRALAZINE HCL 25 MG PO TABS
25.0000 mg | ORAL_TABLET | Freq: Three times a day (TID) | ORAL | Status: DC
  Administered 2021-01-19 – 2021-02-01 (×39): 25 mg
  Filled 2021-01-19 (×41): qty 1

## 2021-01-19 MED ORDER — HYDRALAZINE HCL 25 MG PO TABS: 25.0000 mg | ORAL_TABLET | Freq: Three times a day (TID) | ORAL | Status: DC

## 2021-01-19 NOTE — Progress Notes (Signed)
Patient ID: Angel [name], male   DOB: 09-28-1950, 70 y.o.   MRN: [id number] S: episodes of bradycardia yesterday requiring atropine. UF decreased, was able to run net neg 50cc/hr now tolerating net neg 100cc/hr. Discussed with family bedside and discussed with ICU staff. O:BP (!) 186/66   Pulse 69   Temp 97.6 F (36.4 C) (Axillary)   Resp (!) 27   Ht 6' 2"  (1.88 m)   Wt (!) 143.2 kg   SpO2 95%   BMI 40.53 kg/m   Intake/Output Summary (Last 24 hours) at 01/19/2021 1256 Last data filed at 01/19/2021 1215 Gross per 24 hour  Intake 1948.71 ml  Output 3617 ml  Net -1668.29 ml   Intake/Output: I/O last 3 completed shifts: In: 4093.1 [P.O.:60; I.V.:568.1; NG/GT:3465] Out: 8756 [Urine:50; Other:5900; Stool:425]  Intake/Output this shift:  Total I/O In: 519.1 [I.V.:29.1; NG/GT:490] Out: 949 [Other:849; Stool:100] Weight change: 6.1 kg Gen:on vent via trach, sedated CVS: s1s2 Resp:decreased BS bilaterally, trach Abd:+BS, soft Ext: 3+ anasarca  Recent Labs  Lab 01/15/21 0201 01/16/21 0536 01/16/21 1809 01/17/21 0538 01/17/21 1610 01/18/21 0444 01/18/21 1551 01/19/21 0552  NA 146* 146* 147* 145 147* 137 140  142 139  K 3.2* 3.0* 3.9 3.7 4.0 4.0 4.5  4.6 4.2  CL 117* 116* 115* 115* 116* 107 108 106  CO2 15* 17* 16* 16* 17* 18* 21* 22  GLUCOSE 150* 311* 220* 211* 156* 261* 198* 149*  BUN 127* 128* 134* 143* 149* 120* 100* 80*  CREATININE 4.90* 4.80* 5.09* 5.40* 5.50* 4.47* 3.63* 2.82*  ALBUMIN 1.7* 1.8*  --  1.9* 1.9* 1.9*  --  2.0*  CALCIUM 8.2* 8.1* 8.2* 8.2* 8.0* 8.0* 8.0* 8.4*  PHOS 7.9* 6.9*  --  6.5*  6.7* 7.2* 6.3*  6.5* 6.8* 4.7*  4.6   Liver Function Tests: Recent Labs  Lab 01/17/21 1610 01/18/21 0444 01/19/21 0552  ALBUMIN 1.9* 1.9* 2.0*   No results for input(s): LIPASE, AMYLASE in the last 168 hours. No results for input(s): AMMONIA in the last 168 hours. CBC: Recent Labs  Lab 01/15/21 0201 01/16/21 0536 01/17/21 0538 01/18/21 0444 01/18/21 1551  01/19/21 0552  WBC 10.1 9.6 10.3 10.2  --  13.3*  HGB 7.9* 7.7* 7.7* 7.9* 8.2* 8.7*  HCT 25.9* 25.4* 25.0* 25.1* 24.0* 27.5*  MCV 85.5 85.2 83.6 83.4  --  84.1  PLT 572* 542* 591* 499*  --  530*   Cardiac Enzymes: No results for input(s): CKTOTAL, CKMB, CKMBINDEX, TROPONINI in the last 168 hours. CBG: Recent Labs  Lab 01/18/21 1950 01/18/21 2325 01/19/21 0324 01/19/21 0821 01/19/21 1147  GLUCAP 227* 188* 151* 127* 123*    Iron Studies: No results for input(s): IRON, TIBC, TRANSFERRIN, FERRITIN in the last 72 hours. Studies/Results: DG CHEST PORT 1 VIEW  Result Date: 01/17/2021 CLINICAL DATA:  Dialysis catheter placement. EXAM: PORTABLE CHEST 1 VIEW COMPARISON:  January 16, 2021. FINDINGS: Stable cardiomegaly. Tracheostomy tube is unchanged in position. Feeding tube is seen entering stomach. Interval placement of left subclavian catheter with distal tip in expected position of the SVC. Right-sided PICC line is unchanged. No pneumothorax is noted. Right basilar opacity is noted concerning for atelectasis and effusion. Bony thorax is unremarkable. IMPRESSION: Interval placement of left subclavian catheter with distal tip in expected position of the SVC. No pneumothorax is noted. These results were called by telephone at the time of interpretation on 01/17/2021 at 1:20 pm to provider [facility] , who verbally acknowledged these results. Electronically  Signed   By: Marijo Conception M.D.   On: 01/17/2021 13:20    acetaminophen  1,000 mg Per Tube Q6H   chlorhexidine gluconate (MEDLINE KIT)  15 mL Mouth Rinse BID   Chlorhexidine Gluconate Cloth  6 each Topical Q0600   docusate  100 mg Per Tube BID   feeding supplement (PROSource TF)  90 mL Per Tube BID   guaiFENesin  10 mL Per Tube Q4H   hydrALAZINE  25 mg Per Tube Q8H   insulin aspart  0-20 Units Subcutaneous Q4H   insulin aspart  10 Units Subcutaneous Q4H   insulin glargine-yfgn  50 Units Subcutaneous BID   mouth rinse  15 mL Mouth Rinse  10 times per day   methocarbamol  1,000 mg Per Tube Q8H   pantoprazole sodium  40 mg Per Tube Daily   polyethylene glycol  17 g Per Tube Daily   QUEtiapine  100 mg Per Tube BID   senna  1 tablet Per Tube Daily   sodium bicarbonate  1,300 mg Per Tube BID   sodium chloride flush  10-40 mL Intracatheter Q12H    BMET    Component Value Date/Time   NA 139 01/19/2021 0552   K 4.2 01/19/2021 0552   CL 106 01/19/2021 0552   CO2 22 01/19/2021 0552   GLUCOSE 149 (H) 01/19/2021 0552   BUN 80 (H) 01/19/2021 0552   CREATININE 2.82 (H) 01/19/2021 0552   CALCIUM 8.4 (L) 01/19/2021 0552   GFRNONAA 23 (L) 01/19/2021 0552   CBC    Component Value Date/Time   WBC 13.3 (H) 01/19/2021 0552   RBC 3.27 (L) 01/19/2021 0552   HGB 8.7 (L) 01/19/2021 0552   HCT 27.5 (L) 01/19/2021 0552   PLT 530 (H) 01/19/2021 0552   MCV 84.1 01/19/2021 0552   MCH 26.6 01/19/2021 0552   MCHC 31.6 01/19/2021 0552   RDW 19.1 (H) 01/19/2021 0552    Assessment/ Plan: AKI - Multifactorial including ischemic ATN in setting of hypotensive episode/ sepsis earlier in hosp stay complicated by contrast injury.  BP's stable now.  Nonoliguric, patient making urine.  UNa 26, had some proteinuria on UA earlier in hospitalization and albumin of 1.7.  Will recheck UA and order 24 hour urine protein 2.3 grams (could be a sequelae of AKI).   Marked drop off in UOP overnight with rising BUN/Cr.  Discussed case with Dr. [name] and agree with plan to initiate CRRT with UF as tolerated. Avoid nephrotoxic agents such as IV contrast, NSAIDs/Cox II I's, and Fleets enemas as will worsen renal failure. Renally dose meds. CRRT started 9/1, LIJ trialysis placed 9/1. 4k/2.5Ca dialysate at 1500 ml/hr, 4K/2.5Ca for pre and post filter replacement fluids at 300 ml/hr.  No heparin as he is on bivalirudin.  Increase UF removal rate slowly as tolerated, max goal for today is net neg 100cc/hr, can try for net neg 150cc/hr if hemodynamically  stable Fall/ bilat SAH - no surgery required, fall was on 12/28/20 Resp failure - vent support since admission Bradycardia: cardio on board Hypernatremia - improved with free water Resp infection - grew strep/ pseudomonas from resp secretions and was rx'd w/ IV abx from 8/17 to current (only 1 dose IV vanc given on 8/17 then dc'd).  Atrial fib / RVR - in sinus rhythm now Anasarca - UF as tolerated Metabolic acidosis - improved with cvvhd  Gean Quint, MD [facility]

## 2021-01-19 NOTE — Progress Notes (Signed)
Pt placed back on full vent support due to hypertension and increased WOB. Pt tolerating well, RN aware, RT will continue to monitor.

## 2021-01-19 NOTE — Progress Notes (Signed)
Pt experiencing increased amount of pauses on cardiac monitor: HR between 36-65 with associated hypotension with MAP mid 50s- low 60s. Called and spoke with [facility] with cardiology. Per [place]itz hold hydralazine, monitor for prolonged bradycardia and hypotension.

## 2021-01-19 NOTE — Progress Notes (Signed)
Goal for CRRt net -100 x4 hours followed by -150 as tolerated per MD Candiss Norse with nephro.

## 2021-01-19 NOTE — Progress Notes (Signed)
Progress Note  Patient Name: Angel [name] Date of Encounter: 01/19/2021  [facility] Cardiologist: Werner Lean, MD   Subjective   Remains critically ill, intubated, sedated, mechanically ventilated, on CRRT. Overall trend has been for him to have less frequent and shorter episodes of bradycardia, especially over the last couple of hours.  However, he continues to have episodes of abrupt sudden reduction in heart rate from an average of 70 bpm down to the mid 30s.  Sometimes this is triggered by premature atrial contraction.  The rate abruptly slows and then abruptly returned to normal.  He has not had any atrial fibrillation. Today, none of his episodes of bradycardia have been associated with hypotension. Metoprolol and hydralazine were stopped yesterday.  Inpatient Medications    Scheduled Meds:  acetaminophen  1,000 mg Per Tube Q6H   chlorhexidine gluconate (MEDLINE KIT)  15 mL Mouth Rinse BID   Chlorhexidine Gluconate Cloth  6 each Topical Q0600   docusate  100 mg Per Tube BID   feeding supplement (PROSource TF)  90 mL Per Tube BID   guaiFENesin  10 mL Per Tube Q4H   hydrALAZINE  25 mg Per Tube Q8H   insulin aspart  0-20 Units Subcutaneous Q4H   insulin aspart  10 Units Subcutaneous Q4H   insulin glargine-yfgn  50 Units Subcutaneous BID   mouth rinse  15 mL Mouth Rinse 10 times per day   methocarbamol  1,000 mg Per Tube Q8H   pantoprazole sodium  40 mg Per Tube Daily   polyethylene glycol  17 g Per Tube Daily   QUEtiapine  100 mg Per Tube BID   senna  1 tablet Per Tube Daily   sodium bicarbonate  1,300 mg Per Tube BID   sodium chloride flush  10-40 mL Intracatheter Q12H   Continuous Infusions:   prismasol BGK 4/2.5 300 mL/hr at 01/19/21 0139    prismasol BGK 4/2.5 300 mL/hr at 01/19/21 0319   sodium chloride     bivalirudin (ANGIOMAX) infusion 0.5 mg/mL (Non-ACS indications) 0.02 mg/kg/hr (01/19/21 1200)   feeding supplement (PIVOT 1.5 CAL) 1,000 mL  (01/18/21 1016)   niCARDipine Stopped (01/18/21 1012)   prismasol BGK 4/2.5 1,500 mL/hr at 01/19/21 0916   PRN Meds: Place/Maintain arterial line **AND** sodium chloride, artificial tears, heparin, hydrALAZINE, HYDROmorphone (DILAUDID) injection, ondansetron **OR** ondansetron (ZOFRAN) IV, oxyCODONE, sodium chloride flush   Vital Signs    Vitals:   01/19/21 1000 01/19/21 1042 01/19/21 1100 01/19/21 1200  BP:  (!) 186/66    Pulse: 64  64 69  Resp: (!) 23  (!) 25 (!) 27  Temp:      TempSrc:      SpO2: 94%  95% 95%  Weight:      Height:        Intake/Output Summary (Last 24 hours) at 01/19/2021 1205 Last data filed at 01/19/2021 1200 Gross per 24 hour  Intake 1978.71 ml  Output 3362 ml  Net -1383.29 ml   Last 3 Weights 01/19/2021 01/17/2021 01/13/2021  Weight (lbs) 315 lb 11.2 oz 302 lb 4 oz 306 lb 10.6 oz  Weight (kg) 143.2 kg 137.1 kg 139.1 kg      Telemetry    Sinus rhythm with occasional PACs and frequent but brief episodes of sudden sinus bradycardia down to about 35 bpm.     Personally Reviewed  ECG    Review of ECG from 01/18/2021 at roughly 1600 hrs. shows what appears to be sinus rhythm  with blocked PACs in a pattern of bigeminy that explains the bradycardia.- Personally Reviewed  Physical Exam  Intubated, mechanically ventilated, sedated, grossly edematous/anasarca GEN: No acute distress.   Neck: Unable to evaluate JVD Cardiac: RRR, no murmurs, rubs, or gallops.  Respiratory: Clear to auscultation bilaterally. GI: Soft, nontender, non-distended  MS: Grossly edematous all 4 extremities, face, probably has some ascites.; No deformity. Neuro:  Nonfocal  Psych: Normal affect   Labs    High Sensitivity Troponin:   Recent Labs  Lab 01/10/21 1346 01/10/21 1518  TROPONINIHS 52* 56*      Chemistry Recent Labs  Lab 01/17/21 1610 01/18/21 0444 01/18/21 1551 01/19/21 0552  NA 147* 137 140  142 139  K 4.0 4.0 4.5  4.6 4.2  CL 116* 107 108 106  CO2 17* 18*  21* 22  GLUCOSE 156* 261* 198* 149*  BUN 149* 120* 100* 80*  CREATININE 5.50* 4.47* 3.63* 2.82*  CALCIUM 8.0* 8.0* 8.0* 8.4*  ALBUMIN 1.9* 1.9*  --  2.0*  GFRNONAA 10* 13* 17* 23*  ANIONGAP _0 Hematology Recent Labs  Lab 01/17/21 0538 01/18/21 0444 01/18/21 1551 01/19/21 0552  WBC 10.3 10.2  --  13.3*  RBC 2.99* 3.01*  --  3.27*  HGB 7.7* 7.9* 8.2* 8.7*  HCT 25.0* 25.1* 24.0* 27.5*  MCV 83.6 83.4  --  84.1  MCH 25.8* 26.2  --  26.6  MCHC 30.8 31.5  --  31.6  RDW 18.6* 18.9*  --  19.1*  PLT 591* 499*  --  530*    BNPNo results for input(s): BNP, PROBNP in the last 168 hours.   DDimer No results for input(s): DDIMER in the last 168 hours.   Radiology    DG CHEST PORT 1 VIEW  Result Date: 01/17/2021 CLINICAL DATA:  Dialysis catheter placement. EXAM: PORTABLE CHEST 1 VIEW COMPARISON:  January 16, 2021. FINDINGS: Stable cardiomegaly. Tracheostomy tube is unchanged in position. Feeding tube is seen entering stomach. Interval placement of left subclavian catheter with distal tip in expected position of the SVC. Right-sided PICC line is unchanged. No pneumothorax is noted. Right basilar opacity is noted concerning for atelectasis and effusion. Bony thorax is unremarkable. IMPRESSION: Interval placement of left subclavian catheter with distal tip in expected position of the SVC. No pneumothorax is noted. These results were called by telephone at the time of interpretation on 01/17/2021 at 1:20 pm to provider [facility] , who verbally acknowledged these results. Electronically Signed   By: Marijo Conception M.D.   On: 01/17/2021 13:20    Cardiac Studies   TTE 01/10/2021  1. Left ventricular ejection fraction, by estimation, is 60 to 65%. The  left ventricle has normal function. The left ventricle has no regional  wall motion abnormalities. There is mild left ventricular hypertrophy.  Left ventricular diastolic parameters  were normal.   2. Right ventricular systolic  function is mildly reduced. The right  ventricular size is not well visualized.   3. The mitral valve is normal in structure. Trivial mitral valve  regurgitation. No evidence of mitral stenosis.   4. The aortic valve is tricuspid. Aortic valve regurgitation is not  visualized. Mild aortic valve sclerosis is present, with no evidence of  aortic valve stenosis.     Patient Profile     70 y.o. male with hypertension/diabetes mellitus type 2/hyperlipidemia, admitted for traumatic subarachnoid hemorrhage after a fall in his home, complicated by multifocal pneumonia with septic  shock, acute renal failure, new onset atrial fibrillation (August 19), bilateral pulmonary embolism (August 24),, subsequently developing episodes of abrupt bradycardia which led to discontinuation of amiodarone and metoprolol.  Assessment & Plan    No recent episodes of atrial fibrillation. Based on ECG from yesterday afternoon, his episodes of sudden bradycardia appear to be due to periods of atrial bigeminy with the premature atrial beat being blocked and causing a longer pause. Paradoxically, this may get worse as the beta-blocker and the amiodarone wear off.  For the time being he is not having serious issues related to arrhythmia. Is also likely that he may have some underlying conduction system disease at age 17. He is markedly hypertensive and we will resume his hydralazine, which will not have any arrhythmia implications. He remains critically ill and his prognosis is extremely guarded.  CRITICAL CARE Performed by: Dani Gobble Ronin Rehfeldt   Total critical care time: 60 minutes  Critical care time was exclusive of separately billable procedures and treating other patients.  Critical care was necessary to treat or prevent imminent or life-threatening deterioration.  Critical care was time spent personally by me on the following activities: development of treatment plan with patient and/or surrogate as well as nursing,  discussions with consultants, evaluation of patient's response to treatment, examination of patient, obtaining history from patient or surrogate, ordering and performing treatments and interventions, ordering and review of laboratory studies, ordering and review of radiographic studies, pulse oximetry and re-evaluation of patient's condition.      For questions or updates, please contact [place] Please consult [email or web address] for contact info under        Signed, Sanda Klein, MD  01/19/2021, 12:05 PM

## 2021-01-19 NOTE — Progress Notes (Signed)
Discussed with MD Donne Hazel patients SPB 160-170 despite PRN hydralazine given. MD Donne Hazel okay with SBP in this range. Will discuss with cardiology when at bedside.

## 2021-01-19 NOTE — Plan of Care (Signed)
  Problem: Nutrition: Goal: Adequate nutrition will be maintained Outcome: Progressing   Problem: Elimination: Goal: Will not experience complications related to bowel motility Outcome: Progressing Goal: Will not experience complications related to urinary retention Outcome: Progressing   Problem: Activity: Goal: Risk for activity intolerance will decrease Outcome: Not Progressing   Problem: Safety: Goal: Non-violent Restraint(s) Outcome: Completed/Met

## 2021-01-19 NOTE — Consult Note (Signed)
Palliative Medicine Inpatient Consult Note  Reason for consult:  goals of care  HPI:  Per intake H&P 8/12 by [name] is a 70 y.o. male who presented via EMS as a level 1 trauma. Per EMS report patient was at home, going to his basement when he fell down ~8 stairs and hit his head. Reports LOC. Was agitated in route, confused and emesis x 2. On arrival he was sitting up on stretcher and reported he was nauseated. Noted to be hypertensive on initial pressure with HR in the 80's. He had blood coming from his right ear. Appeared to have TM rupture per EDP exam. HA reported, otherwise he did not report any area of pain. EMS reports PMhx of HTN and DM2. He reports that he takes allopurinol at home. No reported blood thinners. NKDA. Occasional alcohol use. No illicit drug use. Tobacco use unknown. He lives at home with his wife. He was intubated in the trauma bay for airway protection. He was brought to the CT scanner."  He was diagnosed with a TBI, SAH, SDH, temporal bone fracture extending into middle ear, occipital bone fracture, and Right Tm rupture.  Diagnosed with PE 8/24, weaned on 8/26 but reintubated within 2 hours, later trached, course was complicated by  AKI, pneumonia, septic shock, a fib with RVR intermittently since 8/19, acidosis, and episodes of bradycardia 9/2.  CVVHD stated 9/2. Last week he was following commands, but now he is not, movement is nonpurposeful.   Clinical Assessment/Goals of Care: I have reviewed medical records including EPIC notes, labs and imaging, received report from bedside RN, [name], and assessed the patient.    I met with Angel [name] wife, [name] and their daughter [name] to further discuss diagnosis prognosis, GOC, EOL wishes, disposition and options.   I introduced Palliative Medicine as specialized medical care for people living with serious illness. It focuses on providing relief from the symptoms and stress of a serious illness.  The goal is to improve quality of life for both the patient and the family.  [name] and his wife have been married for 50 years. They have two children [name] and [name] and 2 grandchildren. He and his wife both worked for [employer] and retired in 2010. Since retirement he has enjoyed "tinkering" in his basement and the grandchildren.   A detailed discussion was had today regarding advanced directives.  Concepts specific to code status, and pacemaker placement if needed were had.  The difference between a aggressive medical intervention path  and a palliative comfort care path for this patient at this time was had. Values and goals of care important to patient and family were attempted to be elicited.  Family will consider our conversation and talk with his son as well before making decisions. They state that yesterday when pacer was discussed they had pretty much made the decision to not do this if his condition indicated it needed to be done but they had not let anyone know.  Nurse [name] assured them that if he had sustained periods of bradycardia they would have called them to discuss it.  Discussed the importance of continued conversation with family and their  medical providers regarding overall plan of care and treatment options, ensuring decisions are within the context of the patients values and GOCs. We will continue to follow and be a resource for further conversations. Wife and daughter have our card with contact number to reach [place].   Provided wife,  [name] with "Hard Choices for [employer]" booklet.   Decision Maker: wife [name] [phone], cell [phone] (no voicemail)  SUMMARY OF RECOMMENDATIONS    Code Status/Advance Care Planning: FULL CODE    Symptom Management:  Pain- hydromorphone prn, oxycodone prn,  Muscle spasms- metocarbamol prm Nausea zofran prn    Palliative Prophylaxis:  Dry eyes- Lacrilube opthalmic ointment  GI prophylaxis- pantoprazole  daily Constipation- colace and [place]  Additional Recommendations (Limitations, Scope, Preferences): Continue CVVHD Continued conversation with family to ascertain patient wishes to guide goals of care. Our team will follow.    Psycho-social/Spiritual:  Desire for further Chaplaincy support: has support from pastor, declined chaplain support Additional Recommendations:    Prognosis: guarded with multisystem organ failure, acidosis and periods of bradycardia in setting of only non purposeful movement following fall and CHI/SAH, and SDH  Discharge Planning: TBD, in ICU   Vitals with BMI 01/19/2021 01/19/2021 01/19/2021  Height - - -  Weight - - -  BMI - - -  Systolic - 750 -  Diastolic - 58 -  Pulse 70 - 69    PPS: 10%   This conversation/these recommendations were discussed with patient primary care team, trauma service, Dr. Donne Hazel, Dr. Candiss Norse (nephrology), and Dr. Tilden Fossa (cardiology) via secure chat  Thank you for the opportunity to participate in the care of this patient and family.   Time In: 12:05 Time Out: 1;40 Total Time: >70 minutes Greater than 50%  of this time was spent counseling and coordinating care related to the above assessment and plan.  Lindell Spar, NP [facility] Palliative Medicine Team Team Cell Phone: [phone] Please utilize secure chat with additional questions, if there is no response within 30 minutes please call the above phone number  Palliative Medicine Team providers are available by phone from 7am to 7pm daily and can be reached through the team cell phone.  Should this patient require assistance outside of these hours, please call the patient's attending physician.

## 2021-01-19 NOTE — Progress Notes (Signed)
5 Days Post-Op   Subjective/Chief Complaint: Events noted yesterday, better this am, did not tolerate tc already   Objective: Vital signs in last 24 hours: Temp:  [94.3 F (34.6 C)-97.6 F (36.4 C)] 97.5 F (36.4 C) (09/03 0800) Pulse Rate:  [31-85] 52 (09/03 0846) Resp:  [17-32] 32 (09/03 0846) BP: (130-137)/(57-60) 130/57 (09/02 1505) SpO2:  [90 %-96 %] 90 % (09/03 0846) Arterial Line BP: (95-175)/(39-82) 109/82 (09/03 0700) FiO2 (%):  [40 %-60 %] 40 % (09/03 0856) Weight:  [143.2 kg] 143.2 kg (09/03 0300) Last BM Date: 01/18/21  Intake/Output from previous day: 09/02 0701 - 09/03 0700 In: 2278 [I.V.:193; NG/GT:2085] Out: 3964 [Urine:50; Stool:425] Intake/Output this shift: Total I/O In: 170.3 [I.V.:10.3; NG/GT:160] Out: 305 [Other:305]  Gen: no distress Neuro: not f/c HEENT: PERRL Neck: trached CV: regular Pulm: coarse bilaterally Abd: soft, NT Extr:  3+ edema      Lab Results:  Recent Labs    01/18/21 0444 01/18/21 1551 01/19/21 0552  WBC 10.2  --  13.3*  HGB 7.9* 8.2* 8.7*  HCT 25.1* 24.0* 27.5*  PLT 499*  --  530*   BMET Recent Labs    01/18/21 1551 01/19/21 0552  NA 140  142 139  K 4.5  4.6 4.2  CL 108 106  CO2 21* 22  GLUCOSE 198* 149*  BUN 100* 80*  CREATININE 3.63* 2.82*  CALCIUM 8.0* 8.4*   PT/INR No results for input(s): LABPROT, INR in the last 72 hours. ABG Recent Labs    01/18/21 1551  PHART 7.323*  HCO3 21.8    Studies/Results: DG CHEST PORT 1 VIEW  Result Date: 01/17/2021 CLINICAL DATA:  Dialysis catheter placement. EXAM: PORTABLE CHEST 1 VIEW COMPARISON:  January 16, 2021. FINDINGS: Stable cardiomegaly. Tracheostomy tube is unchanged in position. Feeding tube is seen entering stomach. Interval placement of left subclavian catheter with distal tip in expected position of the SVC. Right-sided PICC line is unchanged. No pneumothorax is noted. Right basilar opacity is noted concerning for atelectasis and effusion. Bony  thorax is unremarkable. IMPRESSION: Interval placement of left subclavian catheter with distal tip in expected position of the SVC. No pneumothorax is noted. These results were called by telephone at the time of interpretation on 01/17/2021 at 1:20 pm to provider [facility] , who verbally acknowledged these results. Electronically Signed   By: Marijo Conception M.D.   On: 01/17/2021 13:20    Anti-infectives: Anti-infectives (From admission, onward)    Start     Dose/Rate Route Frequency Ordered Stop   01/11/21 2330  ceFEPIme (MAXIPIME) 2 g in sodium chloride 0.9 % 100 mL IVPB  Status:  Discontinued        2 g 200 mL/hr over 30 Minutes Intravenous Every 24 hours 01/11/21 0711 01/16/21 0907   01/10/21 1645  ampicillin (OMNIPEN) 2 g in sodium chloride 0.9 % 100 mL IVPB  Status:  Discontinued        2 g 300 mL/hr over 20 Minutes Intravenous Every 8 hours 01/10/21 1549 01/16/21 0907   01/09/21 2200  ceFEPIme (MAXIPIME) 2 g in sodium chloride 0.9 % 100 mL IVPB  Status:  Discontinued        2 g 200 mL/hr over 30 Minutes Intravenous Every 12 hours 01/09/21 1458 01/11/21 0711   01/08/21 1515  metroNIDAZOLE (FLAGYL) IVPB 500 mg  Status:  Discontinued        500 mg 100 mL/hr over 60 Minutes Intravenous Every 8 hours 01/08/21 1428 01/10/21 1618  01/03/21 0600  vancomycin (VANCOREADY) IVPB 1250 mg/250 mL  Status:  Discontinued        1,250 mg 166.7 mL/hr over 90 Minutes Intravenous Every 12 hours 01/02/21 1717 01/03/21 0837   01/02/21 1800  vancomycin (VANCOREADY) IVPB 2000 mg/400 mL        2,000 mg 200 mL/hr over 120 Minutes Intravenous  Once 01/02/21 1712 01/02/21 2007   01/02/21 0900  ceFEPIme (MAXIPIME) 2 g in sodium chloride 0.9 % 100 mL IVPB  Status:  Discontinued        2 g 200 mL/hr over 30 Minutes Intravenous Every 8 hours 01/02/21 0849 01/09/21 1458       Assessment/Plan: Fall down stairs 8/12   VDRF - guaifenisen, wean, S/P trach 8/29 by Dr. Bobbye Morton. Did not tolerate tc this am, try  again tomorrow ID - resp CX now with pseud and enterococcus - completed maxipime/ampicillin 8/31, CT A/P with ascending colitis and distention. Stool studies and C. dif are all negative TBI/SAH/SDH - NSGY c/s, Dr. Annette Stable. Significant frontal lobe injuries. Keppra x7d for sz ppx. Occipital bone fx - NSGY c/s, Dr. Annette Stable Temporal bone fx extending into middle ear - ENT c/s, Dr. Constance Holster Right TM Rupture - ENT c/s, Dr. Constance Holster AFRVR - amio off, appreciate Cardiology, back on cardene, lopressor 50mg  BID, hydral 50q8. No further increase in BP meds for now to allow as much fluid removal as possible.  ABL anemia - stable Bilateral pulmonary embolism - bival AKI - CRRT per Renal today. Optimize fluid removal net 150/h today, will reduce this due to issues yesterday, not sure why arrhythmias yesterday with multiple variables. Hx DM2 - resistant SSI, novolog q4, glargine  50u BID Hx HTN - PRN meds FEN - NPO, TF VTE - SCDs, bival gtt Foley - removed Dispo - ICU     Critical Care Total Time: 30 minutes  Rolm Bookbinder 01/19/2021

## 2021-01-19 NOTE — Progress Notes (Signed)
[facility] for bivalirudin Indication: atrial fibrillation, DVT, PE 8/23   No Known Allergies  Patient Measurements: Height: 6\' 2"  (188 cm) Weight: (!) 143.2 kg (315 lb 11.2 oz) IBW/kg (Calculated) : 82.2 Heparin Dosing Weight: 107kg  Vital Signs: Temp: 97.5 F (36.4 C) (09/03 0800) Temp Source: Axillary (09/03 0800) BP: 176/87 (09/03 0840) Pulse Rate: 64 (09/03 1000)  Labs: Recent Labs    01/17/21 0538 01/17/21 1007 01/17/21 1856 01/18/21 0444 01/18/21 1551 01/19/21 0552  HGB 7.7*  --   --  7.9* 8.2* 8.7*  HCT 25.0*  --   --  25.1* 24.0* 27.5*  PLT 591*  --   --  499*  --  530*  APTT 66*   < > 51* 53*  --  55*  CREATININE 5.40*   < >  --  4.47* 3.63* 2.82*   < > = values in this interval not displayed.     Estimated Creatinine Clearance: 36.8 mL/min (A) (by C-G formula based on SCr of 2.82 mg/dL (H)).   Assessment: 66 YOM presenting s/p fall with TBI/SAH and facial fx, in afib started on amiodarone and now cleared per trauma for full dose anticoagulation. 8/23 patient found to have small acute bilateral PE and age-indeterminate LUE DVT. Pharmacy consulted to dose bivalirudin per Trauma.  Given recent head bleed, will aim for middle of therapeutic range aptt and watch closely for signs and symptoms of bleeding.   aPTT this morning remains therapeutic (aPTT 55, goal of 50-65), CBC low but stable - no active bleeding noted.   Goal of Therapy:  Aptt goal ~50-65s per discussion with Trauma  Monitor platelets by anticoagulation protocol: Yes   Plan:  - Continue Bivalirudin at 0.02 mg/kg/hr - Daily aPTT monitoring - Will continue to monitor for any signs/symptoms of bleeding  Thank you for involving pharmacy in this patient's care.  Renold Genta, PharmD, BCPS Clinical Pharmacist Clinical phone for 01/19/2021 until 3p is [phone] 01/19/2021 10:22 AM  **Pharmacist phone directory can be found on [email or web address] listed under [place]**

## 2021-01-19 NOTE — Progress Notes (Deleted)
Pt placed back on full vent support due to hypertension and increased WOB. Pt tolerating well, RN aware, RT will continue to monitor.

## 2021-01-20 DIAGNOSIS — Z7189 Other specified counseling: Secondary | ICD-10-CM | POA: Diagnosis not present

## 2021-01-20 DIAGNOSIS — I609 Nontraumatic subarachnoid hemorrhage, unspecified: Secondary | ICD-10-CM | POA: Diagnosis not present

## 2021-01-20 DIAGNOSIS — Z515 Encounter for palliative care: Secondary | ICD-10-CM | POA: Diagnosis not present

## 2021-01-20 LAB — GLUCOSE, CAPILLARY
Glucose-Capillary: 136 mg/dL — ABNORMAL HIGH (ref 70–99)
Glucose-Capillary: 149 mg/dL — ABNORMAL HIGH (ref 70–99)
Glucose-Capillary: 162 mg/dL — ABNORMAL HIGH (ref 70–99)
Glucose-Capillary: 182 mg/dL — ABNORMAL HIGH (ref 70–99)
Glucose-Capillary: 185 mg/dL — ABNORMAL HIGH (ref 70–99)
Glucose-Capillary: 200 mg/dL — ABNORMAL HIGH (ref 70–99)

## 2021-01-20 LAB — RENAL FUNCTION PANEL
Albumin: 1.9 g/dL — ABNORMAL LOW (ref 3.5–5.0)
Albumin: 1.9 g/dL — ABNORMAL LOW (ref 3.5–5.0)
Anion gap: 10 (ref 5–15)
Anion gap: 9 (ref 5–15)
BUN: 67 mg/dL — ABNORMAL HIGH (ref 8–23)
BUN: 64 mg/dL — ABNORMAL HIGH (ref 8–23)
CO2: 23 mmol/L (ref 22–32)
CO2: 23 mmol/L (ref 22–32)
Calcium: 8 mg/dL — ABNORMAL LOW (ref 8.9–10.3)
Calcium: 7.9 mg/dL — ABNORMAL LOW (ref 8.9–10.3)
Chloride: 100 mmol/L (ref 98–111)
Chloride: 105 mmol/L (ref 98–111)
Creatinine, Ser: 2.4 mg/dL — ABNORMAL HIGH (ref 0.61–1.24)
Creatinine, Ser: 2.2 mg/dL — ABNORMAL HIGH (ref 0.61–1.24)
GFR, Estimated: 28 mL/min — ABNORMAL LOW (ref >60)
GFR, Estimated: 31 mL/min — ABNORMAL LOW (ref >60)
Glucose, Bld: 196 mg/dL — ABNORMAL HIGH (ref 70–99)
Glucose, Bld: 163 mg/dL — ABNORMAL HIGH (ref 70–99)
Phosphorus: 4.1 mg/dL (ref 2.5–4.6)
Phosphorus: 4 mg/dL (ref 2.5–4.6)
Potassium: 4.2 mmol/L (ref 3.5–5.1)
Potassium: 3.9 mmol/L (ref 3.5–5.1)
Sodium: 133 mmol/L — ABNORMAL LOW (ref 135–145)
Sodium: 137 mmol/L (ref 135–145)

## 2021-01-20 LAB — MAGNESIUM: Magnesium: 2.6 mg/dL — ABNORMAL HIGH (ref 1.7–2.4)

## 2021-01-20 LAB — APTT: aPTT: 51 s — ABNORMAL HIGH (ref 24–36)

## 2021-01-20 LAB — CBC
HCT: 27.2 % — ABNORMAL LOW (ref 39.0–52.0)
Hemoglobin: 8.4 g/dL — ABNORMAL LOW (ref 13.0–17.0)
MCH: 26 pg (ref 26.0–34.0)
MCHC: 30.9 g/dL (ref 30.0–36.0)
MCV: 84.2 fL (ref 80.0–100.0)
Platelets: 467 10*3/uL — ABNORMAL HIGH (ref 150–400)
RBC: 3.23 MIL/uL — ABNORMAL LOW (ref 4.22–5.81)
RDW: 19.8 % — ABNORMAL HIGH (ref 11.5–15.5)
WBC: 14.2 10*3/uL — ABNORMAL HIGH (ref 4.0–10.5)
nRBC: 0.4 % — ABNORMAL HIGH (ref 0.0–0.2)

## 2021-01-20 NOTE — Progress Notes (Signed)
Pt placed back on full vent support due to tachycardia, and increased WOB. Pt tolerating well at this time RN aware, RT will continue to monitor.

## 2021-01-20 NOTE — Plan of Care (Signed)
  Problem: Nutrition: Goal: Adequate nutrition will be maintained Outcome: Progressing   Problem: Elimination: Goal: Will not experience complications related to bowel motility Outcome: Progressing Goal: Will not experience complications related to urinary retention Outcome: Progressing   Problem: Activity: Goal: Risk for activity intolerance will decrease Outcome: Not Progressing

## 2021-01-20 NOTE — Progress Notes (Signed)
6 Days Post-Op   Subjective/Chief Complaint: Doing ok this am, afib, some volume off   Objective: Vital signs in last 24 hours: Temp:  [97.6 F (36.4 C)-98.6 F (37 C)] 98.6 F (37 C) (09/04 0800) Pulse Rate:  [49-126] 103 (09/04 0900) Resp:  [20-32] 20 (09/04 0900) BP: (108-182)/(58-99) 159/93 (09/04 0900) SpO2:  [94 %-98 %] 97 % (09/04 0900) Arterial Line BP: (119-204)/(53-74) 163/60 (09/04 0400) FiO2 (%):  [40 %] 40 % (09/04 0720) Weight:  [137.4 kg] 137.4 kg (09/04 0400) Last BM Date: 01/19/21  Intake/Output from previous day: 09/03 0701 - 09/04 0700 In: 2106.1 [I.V.:331.1; NG/GT:1775] Out: 7062 [Urine:310; Stool:800] Intake/Output this shift: Total I/O In: 360.7 [I.V.:40.7; NG/GT:320] Out: 872 [Other:772; Stool:100]  Gen: no distress Neuro: not f/c HEENT: PERRL Neck: trach in place CV: regular Pulm: coarse bilaterally Abd: soft, NT Extr:  3+ edema  Lab Results:  Recent Labs    01/19/21 0552 01/20/21 0513  WBC 13.3* 14.2*  HGB 8.7* 8.4*  HCT 27.5* 27.2*  PLT 530* 467*   BMET Recent Labs    01/19/21 1608 01/20/21 0500  NA 138 133*  K 3.8 4.2  CL 104 100  CO2 23 23  GLUCOSE 126* 196*  BUN 79* 67*  CREATININE 2.65* 2.40*  CALCIUM 8.3* 8.0*   PT/INR No results for input(s): LABPROT, INR in the last 72 hours. ABG Recent Labs    01/18/21 1551  PHART 7.323*  HCO3 21.8    Studies/Results: No results found.  Anti-infectives: Anti-infectives (From admission, onward)    Start     Dose/Rate Route Frequency Ordered Stop   01/11/21 2330  ceFEPIme (MAXIPIME) 2 g in sodium chloride 0.9 % 100 mL IVPB  Status:  Discontinued        2 g 200 mL/hr over 30 Minutes Intravenous Every 24 hours 01/11/21 0711 01/16/21 0907   01/10/21 1645  ampicillin (OMNIPEN) 2 g in sodium chloride 0.9 % 100 mL IVPB  Status:  Discontinued        2 g 300 mL/hr over 20 Minutes Intravenous Every 8 hours 01/10/21 1549 01/16/21 0907   01/09/21 2200  ceFEPIme (MAXIPIME) 2 g  in sodium chloride 0.9 % 100 mL IVPB  Status:  Discontinued        2 g 200 mL/hr over 30 Minutes Intravenous Every 12 hours 01/09/21 1458 01/11/21 0711   01/08/21 1515  metroNIDAZOLE (FLAGYL) IVPB 500 mg  Status:  Discontinued        500 mg 100 mL/hr over 60 Minutes Intravenous Every 8 hours 01/08/21 1428 01/10/21 1618   01/03/21 0600  vancomycin (VANCOREADY) IVPB 1250 mg/250 mL  Status:  Discontinued        1,250 mg 166.7 mL/hr over 90 Minutes Intravenous Every 12 hours 01/02/21 1717 01/03/21 0837   01/02/21 1800  vancomycin (VANCOREADY) IVPB 2000 mg/400 mL        2,000 mg 200 mL/hr over 120 Minutes Intravenous  Once 01/02/21 1712 01/02/21 2007   01/02/21 0900  ceFEPIme (MAXIPIME) 2 g in sodium chloride 0.9 % 100 mL IVPB  Status:  Discontinued        2 g 200 mL/hr over 30 Minutes Intravenous Every 8 hours 01/02/21 0849 01/09/21 1458       Assessment/Plan: Fall down stairs 8/12   VDRF - guaifenisen, wean, S/P trach 8/29 by Dr. Bobbye Morton. will attempt to wean again today and do trach collar ID - off abx, no fevers, completed maxipime/ampicillin 8/31, CT A/P with  ascending colitis and distention. Stool studies and C. dif are all negative TBI/SAH/SDH - NSGY c/s, Dr. Annette Stable. Significant frontal lobe injuries. Keppra x7d for sz ppx. Occipital bone fx - NSGY c/s, Dr. Annette Stable Temporal bone fx extending into middle ear - ENT c/s, Dr. Constance Holster Right TM Rupture - ENT c/s, Dr. Constance Holster AFRVR - amio off, appreciate Cardiology,  cardene, lopressor 50mg  BID, hydral 50q8.   ABL anemia - stable Bilateral pulmonary embolism - bivalirudin AKI - CRRT per Renal today. Optimize fluid removal net 100-200/h today Hx DM2 - resistant SSI, novolog q4, glargine  50u BID Hx HTN - PRN meds FEN - NPO, TF VTE - SCDs, bival gtt Dispo - ICU     Critical Care Total Time: 30 minutes     Rolm Bookbinder 01/20/2021

## 2021-01-20 NOTE — Progress Notes (Signed)
[facility] for bivalirudin Indication: atrial fibrillation, DVT, PE 8/23   No Known Allergies  Patient Measurements: Height: 6\' 2"  (188 cm) Weight: (!) 137.4 kg (302 lb 14.6 oz) IBW/kg (Calculated) : 82.2 Heparin Dosing Weight: 107kg  Vital Signs: Temp: 98.6 F (37 C) (09/04 0800) Temp Source: Axillary (09/04 0800) BP: 155/87 (09/04 1100) Pulse Rate: 125 (09/04 1100)  Labs: Recent Labs    01/18/21 0444 01/18/21 1551 01/18/21 1648 01/19/21 0552 01/19/21 1608 01/20/21 0500 01/20/21 0513  HGB 7.9*   < > 7.8* 8.7*  --   --  8.4*  HCT 25.1*   < > 23.0* 27.5*  --   --  27.2*  PLT 499*  --   --  530*  --   --  467*  APTT 53*  --   --  55*  --   --  51*  CREATININE 4.47*   < > 3.80* 2.82* 2.65* 2.40*  --    < > = values in this interval not displayed.     Estimated Creatinine Clearance: 42.3 mL/min (A) (by C-G formula based on SCr of 2.4 mg/dL (H)).   Assessment: 45 YOM presenting s/p fall with TBI/SAH and facial fx, in afib started on amiodarone and now cleared per trauma for full dose anticoagulation. 8/23 patient found to have small acute bilateral PE and age-indeterminate LUE DVT. Pharmacy consulted to dose bivalirudin per Trauma.  Given recent head bleed, will aim for middle of therapeutic range aptt and watch closely for signs and symptoms of bleeding.   aPTT this morning remains therapeutic (aPTT 51, goal of 50-65), CBC low but stable - no active bleeding noted.   Goal of Therapy:  Aptt goal ~50-65s per discussion with Trauma  Monitor platelets by anticoagulation protocol: Yes   Plan:  - Continue Bivalirudin at 0.02 mg/kg/hr - Daily aPTT monitoring - Will continue to monitor for any signs/symptoms of bleeding  Thank you for involving pharmacy in this patient's care.  Renold Genta, PharmD, BCPS Clinical Pharmacist Clinical phone for 01/20/2021 until 3p is [phone] 01/20/2021 11:55 AM  **Pharmacist phone directory can be  found on [email or web address] listed under [place]**

## 2021-01-20 NOTE — Progress Notes (Addendum)
Palliative Medicine Inpatient Follow Up Note  Reason for consult:  goals of care   HPI:  Per intake H&P 8/12 by [name] is a 70 y.o. male who presented via EMS as a level 1 trauma. Per EMS report patient was at home, going to his basement when he fell down ~8 stairs and hit his head. Reports LOC. Was agitated in route, confused and emesis x 2. On arrival he was sitting up on stretcher and reported he was nauseated. Noted to be hypertensive on initial pressure with HR in the 80's. He had blood coming from his right ear. Appeared to have TM rupture per EDP exam. HA reported, otherwise he did not report any area of pain. EMS reports PMhx of HTN and DM2. He reports that he takes allopurinol at home. No reported blood thinners. NKDA. Occasional alcohol use. No illicit drug use. Tobacco use unknown. He lives at home with his wife. He was intubated in the trauma bay for airway protection. He was brought to the CT scanner."  He was diagnosed with a TBI, SAH, SDH, temporal bone fracture extending into middle ear, occipital bone fracture, and Right Tm rupture.  Diagnosed with PE 8/24, weaned on 8/26 but reintubated within 2 hours, later trached, course was complicated by  AKI, pneumonia, septic shock, a fib with RVR intermittently since 8/19, acidosis, and episodes of bradycardia 9/2.  CVVHD stated 9/2. Last week he was following commands, but now he is not, movement is nonpurposeful.   Today's Discussion (01/20/2021):  *Please note that this is a verbal dictation therefore any spelling or grammatical errors are due to the "[place] [facility]" system interpretation.  Chart reviewed.   Per nursing patient seems stable.  Upon my assessment this morning Angel [name] was able to open his eyes though he was not following my directions.   Met with patients spouse, Angel [name] and daughter, Angel [name] at bedside. They did not have any additional questions and concerns for our team. They stated that per their  understanding things will be kept as they are for now to see if Angel [name] can make improvements.  I shared that the PMT will continue to shadow the chart though if he were to decline we would re-involve to further discuss goals of care.   Objective Assessment: Vital Signs Vitals:   01/20/21 0830 01/20/21 0900  BP: (!) 148/99 (!) 159/93  Pulse: (!) 121 (!) 103  Resp: (!) 25 20  Temp:    SpO2: 98% 97%    Intake/Output Summary (Last 24 hours) at 01/20/2021 3295 Last data filed at 01/20/2021 0900 Gross per 24 hour  Intake 2096.22 ml  Output 5995 ml  Net -3898.78 ml   Last Weight  Most recent update: 01/20/2021  5:47 AM    Weight  137.4 kg (302 lb 14.6 oz)              Gen:  Older Caucasian  M acutely ill HEENT: Dry mucous membranes,coretrack,  tracheostomy in place CV: Irregular rate and rhythm  PULM: On ventilator ABD: soft nontender EXT: Generalized edema Neuro: Opens eyes does not follow commands  SUMMARY OF RECOMMENDATIONS   Full Code/ Full scope of care - Provided with a Hard Choices book  Patients family remain to hope for the best. I shared that if we continue to see more declines than improvements in Angel [name]'s condition then the PMT will become re-involved for additional conversations  PMT will continue to shadow along for the time  being  Code Status/Advance Care Planning: FULL CODE   Symptom Management:  Pain - hydromorphone prn, oxycodone prn,  Muscle spasms - metocarbamol prm Nausea- zofran prn  Time Spent: 25 Greater than 50% of the time was spent in counseling and coordination of care ______________________________________________________________________________________ [name] Team Team Cell Phone: [phone] Please utilize secure chat with additional questions, if there is no response within 30 minutes please call the above phone number  Palliative Medicine Team providers are available by phone from 7am to 7pm daily  and can be reached through the team cell phone.  Should this patient require assistance outside of these hours, please call the patient's attending physician.

## 2021-01-20 NOTE — Progress Notes (Signed)
Progress Note  Patient Name: Angel [name] Date of Encounter: 01/20/2021  [facility] Cardiologist: Werner Lean, MD   Subjective   Patient remains critically ill, intubated, sedated on mechanical ventilation and CRRT.  He has had less episodes of significant bradycardia.  He is in atrial fibrillation with rapid rates today.  He has been transitioning between atrial fibrillation and normal rhythm.  In the short amount of time that he has been in normal rhythm, bradycardic episodes have not occurred in the last 12 to 14 hours.  Currently not on any rate controlling medications.  Inpatient Medications    Scheduled Meds:  acetaminophen  1,000 mg Per Tube Q6H   chlorhexidine gluconate (MEDLINE KIT)  15 mL Mouth Rinse BID   Chlorhexidine Gluconate Cloth  6 each Topical Q0600   docusate  100 mg Per Tube BID   feeding supplement (PROSource TF)  90 mL Per Tube BID   guaiFENesin  10 mL Per Tube Q4H   hydrALAZINE  25 mg Per Tube Q8H   insulin aspart  0-20 Units Subcutaneous Q4H   insulin aspart  10 Units Subcutaneous Q4H   insulin glargine-yfgn  50 Units Subcutaneous BID   mouth rinse  15 mL Mouth Rinse 10 times per day   methocarbamol  1,000 mg Per Tube Q8H   pantoprazole sodium  40 mg Per Tube Daily   polyethylene glycol  17 g Per Tube Daily   QUEtiapine  100 mg Per Tube BID   senna  1 tablet Per Tube Daily   sodium bicarbonate  1,300 mg Per Tube BID   sodium chloride flush  10-40 mL Intracatheter Q12H   Continuous Infusions:   prismasol BGK 4/2.5 300 mL/hr at 01/19/21 1851    prismasol BGK 4/2.5 300 mL/hr at 01/19/21 2032   sodium chloride     bivalirudin (ANGIOMAX) infusion 0.5 mg/mL (Non-ACS indications) 0.02 mg/kg/hr (01/20/21 1000)   feeding supplement (PIVOT 1.5 CAL) 1,000 mL (01/18/21 1016)   niCARDipine Stopped (01/20/21 0609)   prismasol BGK 4/2.5 1,500 mL/hr at 01/20/21 0935   PRN Meds: Place/Maintain arterial line **AND** sodium chloride, artificial tears,  heparin, hydrALAZINE, HYDROmorphone (DILAUDID) injection, ondansetron **OR** ondansetron (ZOFRAN) IV, oxyCODONE, sodium chloride flush   Vital Signs    Vitals:   01/20/21 0730 01/20/21 0800 01/20/21 0830 01/20/21 0900  BP: (!) 146/84 (!) 157/90 (!) 148/99 (!) 159/93  Pulse: (!) 119 (!) 119 (!) 121 (!) 103  Resp: (!) 25 (!) 27 (!) 25 20  Temp:  98.6 F (37 C)    TempSrc:  Axillary    SpO2: 97% 97% 98% 97%  Weight:      Height:        Intake/Output Summary (Last 24 hours) at 01/20/2021 1028 Last data filed at 01/20/2021 1000 Gross per 24 hour  Intake 2106.19 ml  Output 5937 ml  Net -3830.81 ml    Last 3 Weights 01/20/2021 01/19/2021 01/17/2021  Weight (lbs) 302 lb 14.6 oz 315 lb 11.2 oz 302 lb 4 oz  Weight (kg) 137.4 kg 143.2 kg 137.1 kg      Telemetry    Atrial fibrillation with rapid rates, intermittent sinus rhythm-personally reviewed  ECG    None new  Physical Exam   GEN: Intubated, sedated, no acute distress  HEENT: normal  Neck: no JVD, carotid bruits, or masses Cardiac: Tachycardic irregular, no murmurs, rubs, or gallops, grossly edematous in all 4 extremities  respiratory:  clear to auscultation bilaterally, normal work of  breathing GI: soft, nontender, nondistended, + BS MS: no deformity or atrophy  Skin: warm and dry Neuro:  Strength and sensation are intact Psych: euthymic mood, full affect   Labs    High Sensitivity Troponin:   Recent Labs  Lab 01/10/21 1346 01/10/21 1518  TROPONINIHS 52* 56*       Chemistry Recent Labs  Lab 01/19/21 0552 01/19/21 1608 01/20/21 0500  NA 139 138 133*  K 4.2 3.8 4.2  CL 106 104 100  CO2 _0 GLUCOSE 149* 126* 196*  BUN 80* 79* 67*  CREATININE 2.82* 2.65* 2.40*  CALCIUM 8.4* 8.3* 8.0*  ALBUMIN 2.0* 1.9* 1.9*  GFRNONAA 23* 25* 28*  ANIONGAP _1 Hematology Recent Labs  Lab 01/18/21 0444 01/18/21 1551 01/18/21 1648 01/19/21 0552 01/20/21 0513  WBC 10.2  --   --  13.3* 14.2*  RBC 3.01*   --   --  3.27* 3.23*  HGB 7.9*   < > 7.8* 8.7* 8.4*  HCT 25.1*   < > 23.0* 27.5* 27.2*  MCV 83.4  --   --  84.1 84.2  MCH 26.2  --   --  26.6 26.0  MCHC 31.5  --   --  31.6 30.9  RDW 18.9*  --   --  19.1* 19.8*  PLT 499*  --   --  530* 467*   < > = values in this interval not displayed.     BNPNo results for input(s): BNP, PROBNP in the last 168 hours.   DDimer No results for input(s): DDIMER in the last 168 hours.   Radiology    No results found.  Cardiac Studies   TTE 01/10/2021  1. Left ventricular ejection fraction, by estimation, is 60 to 65%. The  left ventricle has normal function. The left ventricle has no regional  wall motion abnormalities. There is mild left ventricular hypertrophy.  Left ventricular diastolic parameters  were normal.   2. Right ventricular systolic function is mildly reduced. The right  ventricular size is not well visualized.   3. The mitral valve is normal in structure. Trivial mitral valve  regurgitation. No evidence of mitral stenosis.   4. The aortic valve is tricuspid. Aortic valve regurgitation is not  visualized. Mild aortic valve sclerosis is present, with no evidence of  aortic valve stenosis.     Patient Profile     70 y.o. male with hypertension/diabetes mellitus type 2/hyperlipidemia, admitted for traumatic subarachnoid hemorrhage after a fall in his home, complicated by multifocal pneumonia with septic shock, acute renal failure, new onset atrial fibrillation (August 19), bilateral pulmonary embolism (August 24),, subsequently developing episodes of abrupt bradycardia which led to discontinuation of amiodarone and metoprolol.  Assessment & Plan    Has had episodes of atrial fibrillation over the last few hours.  His atrial fibrillation has been rapid.  He continues in rapid atrial fibrillation throughout the day today, Dyna Figuereo potentially restart amiodarone tomorrow.  He is anticoagulated with bivalirudin.  Would hold off on  beta-blockers for rate control as he has had significant episodes of bradycardia.  Blood pressure is currently well controlled.  Intermittently has had nicardipine.      For questions or updates, please contact [place] Please consult [email or web address] for contact info under        Signed, Malaak Stach Meredith Leeds, MD  01/20/2021, 10:28 AM

## 2021-01-20 NOTE — Progress Notes (Signed)
Patient ID: EBB CARELOCK, male   DOB: 05-04-1951, 70 y.o.   MRN: [id number] S: Episodes of marked hypertension requiring a brief course of nicardipine.  Had bradycardia episodes again with pauses therefore hydralazine DC'd.  Net negative around 3.2 L.  Discussed with staff, tolerating CRRT O:BP (!) 159/93   Pulse (!) 103   Temp 98.6 F (37 C) (Axillary)   Resp 20   Ht 6' 2"  (1.88 m)   Wt (!) 137.4 kg   SpO2 97%   BMI 38.89 kg/m   Intake/Output Summary (Last 24 hours) at 01/20/2021 0921 Last data filed at 01/20/2021 0900 Gross per 24 hour  Intake 2096.22 ml  Output 5995 ml  Net -3898.78 ml   Intake/Output: I/O last 3 completed shifts: In: 2949 [I.V.:394; NG/GT:2555] Out: 7026 [Urine:310; Other:5716; Stool:1000]  Intake/Output this shift:  Total I/O In: 160.5 [I.V.:15.5; NG/GT:145] Out: 685 [Other:585; Stool:100] Weight change: -5.8 kg Gen:on vent via trach, sedated CVS: s1s2 Resp:decreased BS bilaterally, trach Abd:+BS, soft Ext: 2+ anasarca  Recent Labs  Lab 01/16/21 0536 01/16/21 1809 01/17/21 0538 01/17/21 1610 01/18/21 0444 01/18/21 1551 01/18/21 1648 01/19/21 0552 01/19/21 1608 01/20/21 0500  NA 146*   < > 145 147* 137 140  142 141 139 138 133*  K 3.0*   < > 3.7 4.0 4.0 4.5  4.6 4.5 4.2 3.8 4.2  CL 116*   < > 115* 116* 107 108 107 106 104 100  CO2 17*   < > 16* 17* 18* 21*  --  22 23 23   GLUCOSE 311*   < > 211* 156* 261* 198* 208* 149* 126* 196*  BUN 128*   < > 143* 149* 120* 100* 108* 80* 79* 67*  CREATININE 4.80*   < > 5.40* 5.50* 4.47* 3.63* 3.80* 2.82* 2.65* 2.40*  ALBUMIN 1.8*  --  1.9* 1.9* 1.9*  --   --  2.0* 1.9* 1.9*  CALCIUM 8.1*   < > 8.2* 8.0* 8.0* 8.0*  --  8.4* 8.3* 8.0*  PHOS 6.9*  --  6.5*  6.7* 7.2* 6.3*  6.5* 6.8*  --  4.7*  4.6 4.5 4.1   < > = values in this interval not displayed.   Liver Function Tests: Recent Labs  Lab 01/19/21 0552 01/19/21 1608 01/20/21 0500  ALBUMIN 2.0* 1.9* 1.9*   No results for input(s): LIPASE, AMYLASE  in the last 168 hours. No results for input(s): AMMONIA in the last 168 hours. CBC: Recent Labs  Lab 01/16/21 0536 01/17/21 0538 01/18/21 0444 01/18/21 1551 01/18/21 1648 01/19/21 0552 01/20/21 0513  WBC 9.6 10.3 10.2  --   --  13.3* 14.2*  HGB 7.7* 7.7* 7.9*   < > 7.8* 8.7* 8.4*  HCT 25.4* 25.0* 25.1*   < > 23.0* 27.5* 27.2*  MCV 85.2 83.6 83.4  --   --  84.1 84.2  PLT 542* 591* 499*  --   --  530* 467*   < > = values in this interval not displayed.   Cardiac Enzymes: No results for input(s): CKTOTAL, CKMB, CKMBINDEX, TROPONINI in the last 168 hours. CBG: Recent Labs  Lab 01/19/21 1552 01/19/21 1931 01/19/21 2314 01/20/21 0314 01/20/21 0742  GLUCAP 124* 109* 122* 182* 200*    Iron Studies: No results for input(s): IRON, TIBC, TRANSFERRIN, FERRITIN in the last 72 hours. Studies/Results: No results found.  acetaminophen  1,000 mg Per Tube Q6H   chlorhexidine gluconate (MEDLINE KIT)  15 mL Mouth Rinse BID   Chlorhexidine  Gluconate Cloth  6 each Topical Q0600   docusate  100 mg Per Tube BID   feeding supplement (PROSource TF)  90 mL Per Tube BID   guaiFENesin  10 mL Per Tube Q4H   hydrALAZINE  25 mg Per Tube Q8H   insulin aspart  0-20 Units Subcutaneous Q4H   insulin aspart  10 Units Subcutaneous Q4H   insulin glargine-yfgn  50 Units Subcutaneous BID   mouth rinse  15 mL Mouth Rinse 10 times per day   methocarbamol  1,000 mg Per Tube Q8H   pantoprazole sodium  40 mg Per Tube Daily   polyethylene glycol  17 g Per Tube Daily   QUEtiapine  100 mg Per Tube BID   senna  1 tablet Per Tube Daily   sodium bicarbonate  1,300 mg Per Tube BID   sodium chloride flush  10-40 mL Intracatheter Q12H    BMET    Component Value Date/Time   NA 133 (L) 01/20/2021 0500   K 4.2 01/20/2021 0500   CL 100 01/20/2021 0500   CO2 23 01/20/2021 0500   GLUCOSE 196 (H) 01/20/2021 0500   BUN 67 (H) 01/20/2021 0500   CREATININE 2.40 (H) 01/20/2021 0500   CALCIUM 8.0 (L) 01/20/2021 0500    GFRNONAA 28 (L) 01/20/2021 0500   CBC    Component Value Date/Time   WBC 14.2 (H) 01/20/2021 0513   RBC 3.23 (L) 01/20/2021 0513   HGB 8.4 (L) 01/20/2021 0513   HCT 27.2 (L) 01/20/2021 0513   PLT 467 (H) 01/20/2021 0513   MCV 84.2 01/20/2021 0513   MCH 26.0 01/20/2021 0513   MCHC 30.9 01/20/2021 0513   RDW 19.8 (H) 01/20/2021 0513    Assessment/ Plan: AKI - Multifactorial including ischemic ATN in setting of hypotensive episode/ sepsis earlier in hosp stay complicated by contrast injury.  BP's stable now. Marked drop off in urine output therefore CRRT started 9/1  Avoid nephrotoxic agents such as IV contrast, NSAIDs/Cox II I's, and Fleets enemas as will worsen renal failure. Renally dose meds. LIJ trialysis placed 9/1. 4k/2.5Ca dialysate at 1500 ml/hr, 4K/2.5Ca for pre and post filter replacement fluids at 300 ml/hr.  No heparin as he is on bivalirudin.  Increase UF removal rate slowly as tolerated, max goal for today is net neg 100cc/hr, can try for net neg 150-200cc/hr if hemodynamically stable Fall/ bilat SAH - no surgery required, fall was on 12/28/20. Given episodic bradycardia and now with marked hypertension (w/ wide pulse pressures), possible that he is herniating? Deferring to primary service and neurosurg Resp failure - vent support since admission Bradycardia: cardio on board Hypernatremia - improved with free water Resp infection - grew strep/ pseudomonas from resp secretions and was rx'd w/ IV abx from 8/17 to current (only 1 dose IV vanc given on 8/17 then dc'd).  Atrial fib / RVR - in sinus rhythm now Anasarca - UF as tolerated Metabolic acidosis - improved with cvvhd  Gean Quint, MD [facility]

## 2021-01-21 DIAGNOSIS — I4819 Other persistent atrial fibrillation: Secondary | ICD-10-CM | POA: Diagnosis not present

## 2021-01-21 LAB — RENAL FUNCTION PANEL
Albumin: 2 g/dL — ABNORMAL LOW (ref 3.5–5.0)
Albumin: 2.1 g/dL — ABNORMAL LOW (ref 3.5–5.0)
Anion gap: 10 (ref 5–15)
Anion gap: 11 (ref 5–15)
BUN: 68 mg/dL — ABNORMAL HIGH (ref 8–23)
BUN: 61 mg/dL — ABNORMAL HIGH (ref 8–23)
CO2: 23 mmol/L (ref 22–32)
CO2: 24 mmol/L (ref 22–32)
Calcium: 8.3 mg/dL — ABNORMAL LOW (ref 8.9–10.3)
Calcium: 8.3 mg/dL — ABNORMAL LOW (ref 8.9–10.3)
Chloride: 102 mmol/L (ref 98–111)
Chloride: 99 mmol/L (ref 98–111)
Creatinine, Ser: 2.34 mg/dL — ABNORMAL HIGH (ref 0.61–1.24)
Creatinine, Ser: 2.19 mg/dL — ABNORMAL HIGH (ref 0.61–1.24)
GFR, Estimated: 29 mL/min — ABNORMAL LOW (ref >60)
GFR, Estimated: 32 mL/min — ABNORMAL LOW (ref >60)
Glucose, Bld: 184 mg/dL — ABNORMAL HIGH (ref 70–99)
Glucose, Bld: 218 mg/dL — ABNORMAL HIGH (ref 70–99)
Phosphorus: 4.3 mg/dL (ref 2.5–4.6)
Phosphorus: 3.8 mg/dL (ref 2.5–4.6)
Potassium: 4.5 mmol/L (ref 3.5–5.1)
Potassium: 4 mmol/L (ref 3.5–5.1)
Sodium: 135 mmol/L (ref 135–145)
Sodium: 134 mmol/L — ABNORMAL LOW (ref 135–145)

## 2021-01-21 LAB — APTT: aPTT: 56 s — ABNORMAL HIGH (ref 24–36)

## 2021-01-21 LAB — CBC
HCT: 25.9 % — ABNORMAL LOW (ref 39.0–52.0)
Hemoglobin: 7.9 g/dL — ABNORMAL LOW (ref 13.0–17.0)
MCH: 26 pg (ref 26.0–34.0)
MCHC: 30.5 g/dL (ref 30.0–36.0)
MCV: 85.2 fL (ref 80.0–100.0)
Platelets: 387 10*3/uL (ref 150–400)
RBC: 3.04 MIL/uL — ABNORMAL LOW (ref 4.22–5.81)
RDW: 19.7 % — ABNORMAL HIGH (ref 11.5–15.5)
WBC: 13.5 10*3/uL — ABNORMAL HIGH (ref 4.0–10.5)
nRBC: 0.1 % (ref 0.0–0.2)

## 2021-01-21 LAB — GLUCOSE, CAPILLARY
Glucose-Capillary: 180 mg/dL — ABNORMAL HIGH (ref 70–99)
Glucose-Capillary: 145 mg/dL — ABNORMAL HIGH (ref 70–99)
Glucose-Capillary: 163 mg/dL — ABNORMAL HIGH (ref 70–99)
Glucose-Capillary: 151 mg/dL — ABNORMAL HIGH (ref 70–99)
Glucose-Capillary: 181 mg/dL — ABNORMAL HIGH (ref 70–99)
Glucose-Capillary: 140 mg/dL — ABNORMAL HIGH (ref 70–99)

## 2021-01-21 LAB — MAGNESIUM: Magnesium: 2.6 mg/dL — ABNORMAL HIGH (ref 1.7–2.4)

## 2021-01-21 MED ORDER — MIDAZOLAM HCL 2 MG/2ML IJ SOLN
INTRAMUSCULAR | Status: AC
  Filled 2021-01-21: qty 2

## 2021-01-21 MED ORDER — HYDRALAZINE HCL 20 MG/ML IJ SOLN: 10.0000 mg | Freq: Once | INTRAMUSCULAR | Status: DC

## 2021-01-21 MED ORDER — AMIODARONE HCL IN DEXTROSE 360-4.14 MG/200ML-% IV SOLN: 30.0000 mg/h | INTRAVENOUS | Status: DC

## 2021-01-21 MED ORDER — MIDAZOLAM HCL 2 MG/2ML IJ SOLN
1.0000 mg | INTRAMUSCULAR | Status: DC | PRN
  Administered 2021-01-21 – 2021-01-29 (×14): 1 mg via INTRAVENOUS
  Filled 2021-01-21 (×13): qty 2

## 2021-01-21 MED ORDER — AMIODARONE HCL IN DEXTROSE 360-4.14 MG/200ML-% IV SOLN
30.0000 mg/h | INTRAVENOUS | Status: DC
  Administered 2021-01-21 – 2021-01-24 (×7): 30 mg/h via INTRAVENOUS
  Filled 2021-01-21 (×7): qty 200

## 2021-01-21 NOTE — Progress Notes (Signed)
PT Cancellation Note  Patient Details Name: Angel [name] MRN: [id number] DOB: July 08, 1950   Cancelled Treatment:    Reason Eval/Treat Not Completed: Medical issues which prohibited therapy. Pt now on CRRT, full vent support, afib, resting HR at 120sbpm, diaphoretic, sedated and not following commands. Acute PT to return as able, as appropriate to progress mobility as able. Aware palliative care was consulted and will await goals of care documentation.  Kittie Plater, PT, DPT Acute Rehabilitation Services Pager #: [phone] Office #: [phone]    Berline Lopes 01/21/2021, 9:00 AM

## 2021-01-21 NOTE — Progress Notes (Signed)
During my hourly assessment, Angel [name] appeared to "wake up." He was startled and looked very scared. I started talking to him and re-orienting him to where he was and what had happened. He was able to tell me his full name as well as follow commands in all four extremities (open close hands and wiggle toes). His agitation continued with blood pressures elevated in the 180s. I gave him 10mg  of Hydralazine and increased the Cardene. 1mg  of Dilaudid was also given. Dr. Donne Hazel messaged regarding agitation. Thayer Ohm D

## 2021-01-21 NOTE — Progress Notes (Signed)
[place] for bivalirudin Indication: atrial fibrillation, DVT, PE 8/23   No Known Allergies  Patient Measurements: Height: 6\' 2"  (188 cm) Weight: (!) 137.4 kg (302 lb 14.6 oz) IBW/kg (Calculated) : 82.2 Heparin Dosing Weight: 107kg  Vital Signs: Temp: 98.1 F (36.7 C) (09/05 0700) Temp Source: Axillary (09/05 0700) BP: 156/90 (09/05 0800) Pulse Rate: 128 (09/05 0800)  Labs: Recent Labs    01/19/21 0552 01/19/21 1608 01/20/21 0500 01/20/21 0513 01/20/21 1622 01/21/21 0514  HGB 8.7*  --   --  8.4*  --  7.9*  HCT 27.5*  --   --  27.2*  --  25.9*  PLT 530*  --   --  467*  --  387  APTT 55*  --   --  51*  --  56*  CREATININE 2.82*   < > 2.40*  --  2.20* 2.34*   < > = values in this interval not displayed.     Estimated Creatinine Clearance: 43.3 mL/min (A) (by C-G formula based on SCr of 2.34 mg/dL (H)).   Assessment: 6 YOM presenting s/p fall with TBI/SAH and facial fx, in afib started on amiodarone and now cleared per trauma for full dose anticoagulation. 8/23 patient found to have small acute bilateral PE and age-indeterminate LUE DVT. Pharmacy consulted to dose bivalirudin per Trauma.  Given recent head bleed, will aim for middle of therapeutic range aptt and watch closely for signs and symptoms of bleeding.   aPTT this morning remains therapeutic (aPTT 56, goal of 50-65), H/H down - no active bleeding noted. Plt normalized   Goal of Therapy:  Aptt goal ~50-65s per discussion with Trauma  Monitor platelets by anticoagulation protocol: Yes   Plan:  - Continue Bivalirudin at 0.02 mg/kg/hr - Daily aPTT monitoring - Will continue to monitor for any signs/symptoms of bleeding  Thank you for involving pharmacy in this patient's care.  Albertina Parr, PharmD., BCPS, BCCCP Clinical Pharmacist Please refer to [facility] for unit-specific pharmacist    **Pharmacist phone directory can be found on [email or web address] listed under [place]**

## 2021-01-21 NOTE — Progress Notes (Signed)
 Progress Note  Patient Name: Angel [name] Date of Encounter: 01/21/2021  Primary Cardiologist: Mahesh A Chandrasekhar, MD   Subjective   Remains critically ill with trach tube in place and CRRT.   Inpatient Medications    Scheduled Meds:  acetaminophen  1,000 mg Per Tube Q6H   chlorhexidine gluconate (MEDLINE KIT)  15 mL Mouth Rinse BID   Chlorhexidine Gluconate Cloth  6 each Topical Q0600   docusate  100 mg Per Tube BID   feeding supplement (PROSource TF)  90 mL Per Tube BID   guaiFENesin  10 mL Per Tube Q4H   hydrALAZINE  10 mg Intravenous Once   hydrALAZINE  25 mg Per Tube Q8H   insulin aspart  0-20 Units Subcutaneous Q4H   insulin aspart  10 Units Subcutaneous Q4H   insulin glargine-yfgn  50 Units Subcutaneous BID   mouth rinse  15 mL Mouth Rinse 10 times per day   methocarbamol  1,000 mg Per Tube Q8H   pantoprazole sodium  40 mg Per Tube Daily   polyethylene glycol  17 g Per Tube Daily   QUEtiapine  100 mg Per Tube BID   senna  1 tablet Per Tube Daily   sodium bicarbonate  1,300 mg Per Tube BID   sodium chloride flush  10-40 mL Intracatheter Q12H   Continuous Infusions:   prismasol BGK 4/2.5 300 mL/hr at 01/20/21 1215    prismasol BGK 4/2.5 300 mL/hr at 01/21/21 0715   sodium chloride     bivalirudin (ANGIOMAX) infusion 0.5 mg/mL (Non-ACS indications) 0.02 mg/kg/hr (01/21/21 0900)   feeding supplement (PIVOT 1.5 CAL) 1,000 mL (01/20/21 1615)   niCARDipine 10 mg/hr (01/21/21 0900)   prismasol BGK 4/2.5 1,500 mL/hr at 01/21/21 0629   PRN Meds: Place/Maintain arterial line **AND** sodium chloride, artificial tears, heparin, hydrALAZINE, HYDROmorphone (DILAUDID) injection, ondansetron **OR** ondansetron (ZOFRAN) IV, oxyCODONE, sodium chloride flush   Vital Signs    Vitals:   01/21/21 0600 01/21/21 0615 01/21/21 0700 01/21/21 0800  BP: 122/64 129/71 136/65 (!) 156/90  Pulse: (!) 108 (!) 108 (!) 112 (!) 128  Resp: 20 18 18 19  Temp:   98.1 F (36.7 C)   TempSrc:    Axillary   SpO2: 96% 95% 96% 94%  Weight:      Height:        Intake/Output Summary (Last 24 hours) at 01/21/2021 0939 Last data filed at 01/21/2021 0900 Gross per 24 hour  Intake 2888.23 ml  Output 6509 ml  Net -3620.77 ml   Filed Weights   01/17/21 1400 01/19/21 0300 01/20/21 0400  Weight: (!) 137.1 kg (!) 143.2 kg (!) 137.4 kg    Telemetry    Atrial fib with RVR. No significant bradycardia - Personally Reviewed  ECG    none - Personally Reviewed  Physical Exam   GEN: trach tube in place Neck: unable to assess JVD Cardiac: IRIR tachy, no murmurs, rubs, or gallops.  Respiratory: scattered rales GI: Soft, nontender, non-distended  MS: No edema; No deformity. Neuro:  Nonfocal  Psych: Normal affect   Labs    Chemistry Recent Labs  Lab 01/20/21 0500 01/20/21 1622 01/21/21 0514  NA 133* 137 135  K 4.2 3.9 4.5  CL 100 105 102  CO2 23 23 23  GLUCOSE 196* 163* 184*  BUN 67* 64* 68*  CREATININE 2.40* 2.20* 2.34*  CALCIUM 8.0* 7.9* 8.3*  ALBUMIN 1.9* 1.9* 2.0*  GFRNONAA 28* 31* 29*  ANIONGAP 10 9 10       Hematology Recent Labs  Lab 01/19/21 0552 01/20/21 0513 01/21/21 0514  WBC 13.3* 14.2* 13.5*  RBC 3.27* 3.23* 3.04*  HGB 8.7* 8.4* 7.9*  HCT 27.5* 27.2* 25.9*  MCV 84.1 84.2 85.2  MCH 26.6 26.0 26.0  MCHC 31.6 30.9 30.5  RDW 19.1* 19.8* 19.7*  PLT 530* 467* 387    Cardiac EnzymesNo results for input(s): TROPONINI in the last 168 hours. No results for input(s): TROPIPOC in the last 168 hours.   BNPNo results for input(s): BNP, PROBNP in the last 168 hours.   DDimer No results for input(s): DDIMER in the last 168 hours.   Radiology    No results found.  Cardiac Studies   TTE reviewed. EF is normal.  Patient Profile     70 y.o. male admitted with subarachnoid hemorrhage after a fall with multilobar pneumonia, sepsis, and new onset atrial fib with both tachy/brady.   Assessment & Plan    Persistent atrial fib - his rates are increased  with no slow episodes. We will restart IV amiodarone.  HTN - he is now on IV cardene with improvement. This medication will have minimal if any effect on his HR. VDRF - note trachtube in place. Weaning trials to follow. AKI - he will remain on CRRT.       For questions or updates, please contact [place] Please consult [email or web address] for contact info under Cardiology/STEMI.      Signed, Cristopher Peru, MD  01/21/2021, 9:39 AM

## 2021-01-21 NOTE — Progress Notes (Signed)
SLP Cancellation Note  Patient Details Name: Angel KEEVEN MRN: [id number] DOB: Apr 05, 1951   Cancelled treatment:        Reason Eval/Treat Not Completed: Pt not medically able (still intubated, sedated. SLP will f/u for readiness).  Dewitt Rota, SLP-Student   Dewitt Rota 01/21/2021, 8:37 AM

## 2021-01-21 NOTE — Progress Notes (Signed)
[facility] NEPHROLOGY PROGRESS NOTE  Assessment/ Plan:  #Acute kidney injury, oliguric: Multifactorial etiology including ischemic ATN in the setting of hypotension, sepsis complicated by contrast injury.  Started CRRT on 9/1 and has been tolerating well. LIJ trialysis placed 9/1.  No heparin as he is on bivalirudin.  Increase UF rate 100-150 cc/hr.   #Fall/bilateral subarachnoid hemorrhage: Per trauma team.  #Acute respiratory failure: Currently on vent support.  #A. fib with RVR: Starting amiodarone.  # Anemia of critical illness: Transfuse as needed.  #Metabolic acidosis: Managed with dialysis.  Discontinue oral sodium bicarbonate.  #Bilateral pulm embolism: Currently on anticoagulation.  Subjective: Seen and examined.  Currently intubated, sedated and on CRRT.  No event and tolerating UF.  Discussed with the nurse. Objective Vital signs in last 24 hours: Vitals:   01/21/21 0600 01/21/21 0615 01/21/21 0700 01/21/21 0800  BP: 122/64 129/71 136/65 (!) 156/90  Pulse: (!) 108 (!) 108 (!) 112 (!) 128  Resp: 20 18 18 19  Temp:   98.1 F (36.7 C)   TempSrc:   Axillary   SpO2: 96% 95% 96% 94%  Weight:      Height:       Weight change:   Intake/Output Summary (Last 24 hours) at 01/21/2021 1043 Last data filed at 01/21/2021 1000 Gross per 24 hour  Intake 3017.91 ml  Output 6714 ml  Net -3696.09 ml       Labs: Basic Metabolic Panel: Recent Labs  Lab 01/20/21 0500 01/20/21 1622 01/21/21 0514  NA 133* 137 135  K 4.2 3.9 4.5  CL 100 105 102  CO2 23 23 23  GLUCOSE 196* 163* 184*  BUN 67* 64* 68*  CREATININE 2.40* 2.20* 2.34*  CALCIUM 8.0* 7.9* 8.3*  PHOS 4.1 4.0 4.3   Liver Function Tests: Recent Labs  Lab 01/20/21 0500 01/20/21 1622 01/21/21 0514  ALBUMIN 1.9* 1.9* 2.0*   No results for input(s): LIPASE, AMYLASE in the last 168 hours. No results for input(s): AMMONIA in the last 168 hours. CBC: Recent Labs  Lab 01/17/21 0538 01/18/21 0444  01/18/21 1551 01/19/21 0552 01/20/21 0513 01/21/21 0514  WBC 10.3 10.2  --  13.3* 14.2* 13.5*  HGB 7.7* 7.9*   < > 8.7* 8.4* 7.9*  HCT 25.0* 25.1*   < > 27.5* 27.2* 25.9*  MCV 83.6 83.4  --  84.1 84.2 85.2  PLT 591* 499*  --  530* 467* 387   < > = values in this interval not displayed.   Cardiac Enzymes: No results for input(s): CKTOTAL, CKMB, CKMBINDEX, TROPONINI in the last 168 hours. CBG: Recent Labs  Lab 01/20/21 1526 01/20/21 2001 01/20/21 2337 01/21/21 0405 01/21/21 0712  GLUCAP 162* 136* 149* 180* 145*    Iron Studies: No results for input(s): IRON, TIBC, TRANSFERRIN, FERRITIN in the last 72 hours. Studies/Results: No results found.  Medications: Infusions:   prismasol BGK 4/2.5 300 mL/hr at 01/20/21 1215    prismasol BGK 4/2.5 300 mL/hr at 01/21/21 0715   sodium chloride     amiodarone 30 mg/hr (01/21/21 1028)   bivalirudin (ANGIOMAX) infusion 0.5 mg/mL (Non-ACS indications) 0.02 mg/kg/hr (01/21/21 1000)   feeding supplement (PIVOT 1.5 CAL) 1,000 mL (01/20/21 1615)   niCARDipine 10 mg/hr (01/21/21 1000)   prismasol BGK 4/2.5 1,500 mL/hr at 01/21/21 0957    Scheduled Medications:  acetaminophen  1,000 mg Per Tube Q6H   chlorhexidine gluconate (MEDLINE KIT)  15 mL Mouth Rinse BID   Chlorhexidine Gluconate Cloth  6   each Topical Q0600   docusate  100 mg Per Tube BID   feeding supplement (PROSource TF)  90 mL Per Tube BID   guaiFENesin  10 mL Per Tube Q4H   hydrALAZINE  10 mg Intravenous Once   hydrALAZINE  25 mg Per Tube Q8H   insulin aspart  0-20 Units Subcutaneous Q4H   insulin aspart  10 Units Subcutaneous Q4H   insulin glargine-yfgn  50 Units Subcutaneous BID   mouth rinse  15 mL Mouth Rinse 10 times per day   methocarbamol  1,000 mg Per Tube Q8H   pantoprazole sodium  40 mg Per Tube Daily   polyethylene glycol  17 g Per Tube Daily   QUEtiapine  100 mg Per Tube BID   senna  1 tablet Per Tube Daily   sodium bicarbonate  1,300 mg Per Tube BID   sodium  chloride flush  10-40 mL Intracatheter Q12H    have reviewed scheduled and prn medications.  Physical Exam: General: Critically ill looking male, trached, sedated. Heart: Tachycardic, s1s2 nl Lungs: Coarse breath sound bilateral. Abdomen:soft,  Extremities: edema+ Dialysis Access: Left IJ temporary HD catheter    Prasad  01/21/2021,10:43 AM  LOS: 24 days   

## 2021-01-21 NOTE — Progress Notes (Signed)
7 Days Post-Op   Subjective/Chief Complaint: unchanged   Objective: Vital signs in last 24 hours: Temp:  [97.6 F (36.4 C)-98.7 F (37.1 C)] 98.1 F (36.7 C) (09/05 0700) Pulse Rate:  [90-141] 128 (09/05 0800) Resp:  [17-31] 19 (09/05 0800) BP: (122-188)/(63-159) 156/90 (09/05 0800) SpO2:  [92 %-99 %] 94 % (09/05 0800) FiO2 (%):  [40 %] 40 % (09/05 0809) Last BM Date: 01/21/21  Intake/Output from previous day: 09/04 0701 - 09/05 0700 In: 2857 [I.V.:817; NG/GT:2040] Out: 6568 [Urine:380; Stool:450] Intake/Output this shift: Total I/O In: 301.7 [I.V.:121.7; NG/GT:180] Out: 626 [Other:626]  Gen: no distress Neuro: not f/c HEENT: PERRL Neck: trach in place CV: regular Pulm: coarse bilaterally Abd: soft, NT Extr:  3+ edema    Lab Results:  Recent Labs    01/20/21 0513 01/21/21 0514  WBC 14.2* 13.5*  HGB 8.4* 7.9*  HCT 27.2* 25.9*  PLT 467* 387   BMET Recent Labs    01/20/21 1622 01/21/21 0514  NA 137 135  K 3.9 4.5  CL 105 102  CO2 23 23  GLUCOSE 163* 184*  BUN 64* 68*  CREATININE 2.20* 2.34*  CALCIUM 7.9* 8.3*   PT/INR No results for input(s): LABPROT, INR in the last 72 hours. ABG Recent Labs    01/18/21 1551  PHART 7.323*  HCO3 21.8    Studies/Results: No results found.  Anti-infectives: Anti-infectives (From admission, onward)    Start     Dose/Rate Route Frequency Ordered Stop   01/11/21 2330  ceFEPIme (MAXIPIME) 2 g in sodium chloride 0.9 % 100 mL IVPB  Status:  Discontinued        2 g 200 mL/hr over 30 Minutes Intravenous Every 24 hours 01/11/21 0711 01/16/21 0907   01/10/21 1645  ampicillin (OMNIPEN) 2 g in sodium chloride 0.9 % 100 mL IVPB  Status:  Discontinued        2 g 300 mL/hr over 20 Minutes Intravenous Every 8 hours 01/10/21 1549 01/16/21 0907   01/09/21 2200  ceFEPIme (MAXIPIME) 2 g in sodium chloride 0.9 % 100 mL IVPB  Status:  Discontinued        2 g 200 mL/hr over 30 Minutes Intravenous Every 12 hours 01/09/21 1458  01/11/21 0711   01/08/21 1515  metroNIDAZOLE (FLAGYL) IVPB 500 mg  Status:  Discontinued        500 mg 100 mL/hr over 60 Minutes Intravenous Every 8 hours 01/08/21 1428 01/10/21 1618   01/03/21 0600  vancomycin (VANCOREADY) IVPB 1250 mg/250 mL  Status:  Discontinued        1,250 mg 166.7 mL/hr over 90 Minutes Intravenous Every 12 hours 01/02/21 1717 01/03/21 0837   01/02/21 1800  vancomycin (VANCOREADY) IVPB 2000 mg/400 mL        2,000 mg 200 mL/hr over 120 Minutes Intravenous  Once 01/02/21 1712 01/02/21 2007   01/02/21 0900  ceFEPIme (MAXIPIME) 2 g in sodium chloride 0.9 % 100 mL IVPB  Status:  Discontinued        2 g 200 mL/hr over 30 Minutes Intravenous Every 8 hours 01/02/21 0849 01/09/21 1458       Assessment/Plan: Fall down stairs 8/12   VDRF - guaifenisen, wean, S/P trach 8/29 by Dr. Bobbye Morton. trach collar yesterday, try again today ID - off abx, no fevers, completed maxipime/ampicillin 8/31, CT A/P with ascending colitis and distention. Stool studies and C. dif are all negative, continue bowel regimen TBI/SAH/SDH - NSGY c/s, Dr. Annette Stable. Significant frontal lobe injuries.  Keppra x7d for sz ppx. Occipital bone fx - NSGY c/s, Dr. Annette Stable Temporal bone fx extending into middle ear - ENT c/s, Dr. Constance Holster Right TM Rupture - ENT c/s, Dr. Constance Holster AFRVR -  cardene, hydral [phone].   ABL anemia - stable Bilateral pulmonary embolism - bivalirudin AKI - CRRT per Renal today. Optimize fluid removal net 100-200/h today- tol 150 off yesterday Hx DM2 - resistant SSI, novolog q4, glargine  50u BID Hx HTN - PRN meds FEN - NPO, TF VTE - SCDs, bival gtt Dispo - ICU     Critical Care Total Time: 30 minutes  Rolm Bookbinder 01/21/2021

## 2021-01-22 DIAGNOSIS — Z20822 Contact with and (suspected) exposure to covid-19: Secondary | ICD-10-CM | POA: Diagnosis not present

## 2021-01-22 DIAGNOSIS — J9601 Acute respiratory failure with hypoxia: Secondary | ICD-10-CM | POA: Diagnosis not present

## 2021-01-22 DIAGNOSIS — I609 Nontraumatic subarachnoid hemorrhage, unspecified: Secondary | ICD-10-CM | POA: Diagnosis not present

## 2021-01-22 DIAGNOSIS — I2699 Other pulmonary embolism without acute cor pulmonale: Secondary | ICD-10-CM | POA: Diagnosis not present

## 2021-01-22 DIAGNOSIS — N179 Acute kidney failure, unspecified: Secondary | ICD-10-CM

## 2021-01-22 DIAGNOSIS — S066X9A Traumatic subarachnoid hemorrhage with loss of consciousness of unspecified duration, initial encounter: Secondary | ICD-10-CM | POA: Diagnosis not present

## 2021-01-22 DIAGNOSIS — I4819 Other persistent atrial fibrillation: Secondary | ICD-10-CM | POA: Diagnosis not present

## 2021-01-22 LAB — CBC
HCT: 24.5 % — ABNORMAL LOW (ref 39.0–52.0)
Hemoglobin: 7.4 g/dL — ABNORMAL LOW (ref 13.0–17.0)
MCH: 26.1 pg (ref 26.0–34.0)
MCHC: 30.2 g/dL (ref 30.0–36.0)
MCV: 86.6 fL (ref 80.0–100.0)
Platelets: 262 10*3/uL (ref 150–400)
RBC: 2.83 MIL/uL — ABNORMAL LOW (ref 4.22–5.81)
RDW: 19.6 % — ABNORMAL HIGH (ref 11.5–15.5)
WBC: 10.3 10*3/uL (ref 4.0–10.5)
nRBC: 0 % (ref 0.0–0.2)

## 2021-01-22 LAB — RENAL FUNCTION PANEL
Albumin: 2.1 g/dL — ABNORMAL LOW (ref 3.5–5.0)
Albumin: 2.1 g/dL — ABNORMAL LOW (ref 3.5–5.0)
Anion gap: 10 (ref 5–15)
Anion gap: 11 (ref 5–15)
BUN: 63 mg/dL — ABNORMAL HIGH (ref 8–23)
BUN: 57 mg/dL — ABNORMAL HIGH (ref 8–23)
CO2: 24 mmol/L (ref 22–32)
CO2: 24 mmol/L (ref 22–32)
Calcium: 8.4 mg/dL — ABNORMAL LOW (ref 8.9–10.3)
Calcium: 8.1 mg/dL — ABNORMAL LOW (ref 8.9–10.3)
Chloride: 100 mmol/L (ref 98–111)
Chloride: 101 mmol/L (ref 98–111)
Creatinine, Ser: 2.09 mg/dL — ABNORMAL HIGH (ref 0.61–1.24)
Creatinine, Ser: 1.86 mg/dL — ABNORMAL HIGH (ref 0.61–1.24)
GFR, Estimated: 33 mL/min — ABNORMAL LOW (ref >60)
GFR, Estimated: 38 mL/min — ABNORMAL LOW (ref >60)
Glucose, Bld: 152 mg/dL — ABNORMAL HIGH (ref 70–99)
Glucose, Bld: 129 mg/dL — ABNORMAL HIGH (ref 70–99)
Phosphorus: 4.3 mg/dL (ref 2.5–4.6)
Phosphorus: 3.9 mg/dL (ref 2.5–4.6)
Potassium: 4.6 mmol/L (ref 3.5–5.1)
Potassium: 4.6 mmol/L (ref 3.5–5.1)
Sodium: 134 mmol/L — ABNORMAL LOW (ref 135–145)
Sodium: 136 mmol/L (ref 135–145)

## 2021-01-22 LAB — TRIGLYCERIDES: Triglycerides: 145 mg/dL (ref <150)

## 2021-01-22 LAB — GLUCOSE, CAPILLARY
Glucose-Capillary: 120 mg/dL — ABNORMAL HIGH (ref 70–99)
Glucose-Capillary: 130 mg/dL — ABNORMAL HIGH (ref 70–99)
Glucose-Capillary: 135 mg/dL — ABNORMAL HIGH (ref 70–99)
Glucose-Capillary: 147 mg/dL — ABNORMAL HIGH (ref 70–99)
Glucose-Capillary: 152 mg/dL — ABNORMAL HIGH (ref 70–99)
Glucose-Capillary: 160 mg/dL — ABNORMAL HIGH (ref 70–99)

## 2021-01-22 LAB — APTT: aPTT: 60 s — ABNORMAL HIGH (ref 24–36)

## 2021-01-22 LAB — MAGNESIUM: Magnesium: 2.7 mg/dL — ABNORMAL HIGH (ref 1.7–2.4)

## 2021-01-22 MED ORDER — CLONAZEPAM 0.5 MG PO TABS
0.5000 mg | ORAL_TABLET | Freq: Two times a day (BID) | ORAL | Status: DC
  Administered 2021-01-22 – 2021-01-30 (×18): 0.5 mg
  Filled 2021-01-22 (×18): qty 1

## 2021-01-22 MED ORDER — DEXMEDETOMIDINE HCL IN NACL 400 MCG/100ML IV SOLN
0.4000 ug/kg/h | INTRAVENOUS | Status: DC
  Administered 2021-01-22 – 2021-01-23 (×2): 0.4 ug/kg/h via INTRAVENOUS
  Administered 2021-01-23: 0.3 ug/kg/h via INTRAVENOUS
  Administered 2021-01-24: 0.4 ug/kg/h via INTRAVENOUS
  Filled 2021-01-22 (×4): qty 100

## 2021-01-22 NOTE — TOC Progression Note (Signed)
Transition of Care [facility]) - Progression Note    Patient Details  Name: Angel [name] MRN: [id number] Date of Birth: 1951-01-11  [facility] CM/SW Contact  Oren Section Cleta Alberts, RN Phone Number: 01/22/2021, 2:27 PM  Clinical Narrative:   Patient S/P tracheostomy on 01/14/2021; continuing weaning attempts as able.  Patient currently remains on CRRT.  PT/OT continue to recommend inpatient rehab upon medical stability and progress with therapies.  Will follow progress.    Expected Discharge Plan: IP Rehab Facility Barriers to Discharge: Continued Medical Work up  Expected Discharge Plan and Services Expected Discharge Plan: [place]   Discharge Planning Services: CM Consult   Living arrangements for the past 2 months: Single Family Home                                       Social Determinants of Health (SDOH) Interventions    Readmission Risk Interventions No flowsheet data found.  Reinaldo Raddle, RN, BSN  Trauma/Neuro ICU Case Manager [phone]

## 2021-01-22 NOTE — Progress Notes (Signed)
RN came in to get report on patient.  Patient was extremely restless and dyssynchronous with the ventilator.   Was told by dayshift RN that this agitation had become gradually worse throughout the day, despite the administration of prn sedation.  Alerted this information to Trauma MD Dr. Kae Heller.  Was given orders to speak with RT and to place the patient on SIMV ventilator settings and to start a precedex drip. RN will carry out these orders and continue to assess and act accordingly.

## 2021-01-22 NOTE — Progress Notes (Signed)
Inpatient Rehab Admissions Coordinator:   Pt. Continues on ventilator, Not ready for CIR at this time. [facility] team will continue to follow for medical readiness and progress/participation with therapies.   Clemens Catholic, [place], [place] Admissions Coordinator  [phone] ([place]) [phone] (office)

## 2021-01-22 NOTE — Progress Notes (Signed)
ANTICOAGULATION CONSULT NOTE  Pharmacy Consult for bivalirudin Indication: atrial fibrillation, DVT, PE 8/23   No Known Allergies  Patient Measurements: Height: 6\' 2"  (188 cm) Weight: 125.2 kg (276 lb 0.3 oz) IBW/kg (Calculated) : 82.2 Heparin Dosing Weight: 107kg  Vital Signs: Temp: 97.4 F (36.3 C) (09/06 1118) Temp Source: Axillary (09/06 1118) BP: 131/79 (09/06 1100) Pulse Rate: 127 (09/06 1100)  Labs: Recent Labs    01/20/21 0513 01/20/21 1622 01/21/21 0514 01/21/21 1600 01/22/21 0500  HGB 8.4*  --  7.9*  --  7.4*  HCT 27.2*  --  25.9*  --  24.5*  PLT 467*  --  387  --  262  APTT 51*  --  56*  --  60*  CREATININE  --    < > 2.34* 2.19* 2.09*   < > = values in this interval not displayed.     Estimated Creatinine Clearance: 46.2 mL/min (A) (by C-G formula based on SCr of 2.09 mg/dL (H)).   Assessment: 10 YOM presenting s/p fall with TBI/SAH and facial fx, in afib started on amiodarone and now cleared per trauma for full dose anticoagulation. 8/23 patient found to have small acute bilateral PE and age-indeterminate LUE DVT. Pharmacy consulted to dose bivalirudin per Trauma.  Given recent head bleed, will aim for middle of therapeutic range aptt and watch closely for signs and symptoms of bleeding.   aPTT this morning remains therapeutic (aPTT 60, goal of 50-65), H/H down - no active bleeding noted. Plt normalized   Goal of Therapy:  Aptt goal ~50-65s per discussion with Trauma  Monitor platelets by anticoagulation protocol: Yes   Plan:  - Continue Bivalirudin at 0.02 mg/kg/hr - Daily aPTT monitoring - Will continue to monitor for any signs/symptoms of bleeding  Aedyn Mckeon D. Mina Marble, PharmD, BCPS, [place] 01/22/2021, 11:43 AM

## 2021-01-22 NOTE — Progress Notes (Signed)
Patient ID: Angel [name], male   DOB: 10-24-1950, 70 y.o.   MRN: [id number] Follow up - Trauma Critical Care  Patient Details:    Angel [name] is an 70 y.o. male.  Lines/tubes : PICC Triple Lumen 65/68/12 PICC Right Basilic 47 cm 1 cm (Active)  Indication for Insertion or Continuance of Line Poor Vasculature-patient has had multiple peripheral attempts or PIVs lasting less than 24 hours;Prolonged intravenous therapies 01/22/21 0721  Exposed Catheter (cm) 1 cm 01/09/21 0916  Site Assessment Clean;Intact;Dry 01/22/21 0721  Lumen #1 Status Flushed;Blood return noted;Saline locked 01/22/21 0721  Lumen #2 Status Flushed;Blood return noted;Infusing 01/22/21 0721  Lumen #3 Status Flushed;Blood return noted;Saline locked 01/22/21 0721  Dressing Type Transparent;Securing device 01/22/21 7517  Dressing Status Clean;Dry;Intact 01/22/21 0721  Antimicrobial disc in place? Yes 01/22/21 0721  Safety Lock Not Applicable 00/17/49 4496  Line Care Connections checked and tightened 01/22/21 0721  Dressing Intervention New dressing;Other (Comment) 01/09/21 0916  Dressing Change Due 01/24/21 01/22/21 0721     External Urinary Catheter (Active)  Collection Container Standard drainage bag 01/21/21 2000  Suction (Verified suction is between 40-80 mmHg) N/A (Patient has condom catheter) 01/21/21 2000  Securement Method Securing device (Describe) 01/21/21 2000  Site Assessment Clean;Intact 01/21/21 2000  Intervention Male External Urinary Catheter Replaced 01/21/21 2000  Output (mL) 0 mL 01/22/21 0600     Fecal Management System (Active)  Does patient meet criteria for removal? No 01/21/21 2000  Daily care Skin around tube assessed 01/21/21 2000  Output (mL) 0 mL 01/21/21 1700  Intake (mL) 30 mL 01/06/21 0520    Microbiology/Sepsis markers: Results for orders placed or performed during the hospital encounter of 12/28/20  Resp Panel by RT-PCR (Flu A&B, Covid) Nasopharyngeal Swab     Status: None    Collection Time: 12/28/20  4:17 PM   Specimen: Nasopharyngeal Swab; Nasopharyngeal(NP) swabs in vial transport medium  Result Value Ref Range Status   SARS Coronavirus 2 by RT PCR NEGATIVE NEGATIVE Final    Comment: (NOTE) SARS-CoV-2 target nucleic acids are NOT DETECTED.  The SARS-CoV-2 RNA is generally detectable in upper respiratory specimens during the acute phase of infection. The lowest concentration of SARS-CoV-2 viral copies this assay can detect is 138 copies/mL. A negative result does not preclude SARS-Cov-2 infection and should not be used as the sole basis for treatment or other patient management decisions. A negative result may occur with  improper specimen collection/handling, submission of specimen other than nasopharyngeal swab, presence of viral mutation(s) within the areas targeted by this assay, and inadequate number of viral copies(<138 copies/mL). A negative result must be combined with clinical observations, patient history, and epidemiological information. The expected result is Negative.  Fact Sheet for Patients:  EntrepreneurPulse.com.au  Fact Sheet for Healthcare Providers:  IncredibleEmployment.be  This test is no t yet approved or cleared by the [place] FDA and  has been authorized for detection and/or diagnosis of SARS-CoV-2 by FDA under an Emergency Use Authorization (EUA). This EUA will remain  in effect (meaning this test can be used) for the duration of the COVID-19 declaration under Section 564(b)(1) of the Act, 21 U.S.C.section 360bbb-3(b)(1), unless the authorization is terminated  or revoked sooner.       Influenza A by PCR NEGATIVE NEGATIVE Final   Influenza B by PCR NEGATIVE NEGATIVE Final    Comment: (NOTE) The Xpert Xpress SARS-CoV-2/FLU/RSV plus assay is intended as an aid in the diagnosis of influenza from Nasopharyngeal swab specimens and  should not be used as a sole basis for treatment.  Nasal washings and aspirates are unacceptable for Xpert Xpress SARS-CoV-2/FLU/RSV testing.  Fact Sheet for Patients: EntrepreneurPulse.com.au  Fact Sheet for Healthcare Providers: IncredibleEmployment.be  This test is not yet approved or cleared by the [place] FDA and has been authorized for detection and/or diagnosis of SARS-CoV-2 by FDA under an Emergency Use Authorization (EUA). This EUA will remain in effect (meaning this test can be used) for the duration of the COVID-19 declaration under Section 564(b)(1) of the Act, 21 U.S.C. section 360bbb-3(b)(1), unless the authorization is terminated or revoked.  Performed at [facility], [place] [address], [place], [place] [address]   MRSA Next Gen by PCR, Nasal     Status: None   Collection Time: 12/28/20  7:32 PM   Specimen: Nasal Mucosa; Nasal Swab  Result Value Ref Range Status   MRSA by PCR Next Gen NOT DETECTED NOT DETECTED Final    Comment: (NOTE) The GeneXpert MRSA Assay (FDA approved for NASAL specimens only), is one component of a comprehensive MRSA colonization surveillance program. It is not intended to diagnose MRSA infection nor to guide or monitor treatment for MRSA infections. Test performance is not FDA approved in patients less than 44 years old. Performed at [facility], [place] [address] [place], [place] [address]   Culture, Respiratory w Gram Stain     Status: None   Collection Time: 12/31/20 11:06 AM   Specimen: Tracheal Aspirate; Respiratory  Result Value Ref Range Status   Specimen Description TRACHEAL ASPIRATE  Final   Special Requests NONE  Final   Gram Stain   Final    FEW SQUAMOUS EPITHELIAL CELLS PRESENT FEW WBC PRESENT,BOTH PMN AND MONONUCLEAR FEW GRAM POSITIVE COCCI Performed at [facility], [place] [address], [place], [place] [address]    Culture   Final    FEW PSEUDOMONAS AERUGINOSA FEW STREPTOCOCCUS PNEUMONIAE    Report Status  01/03/2021 FINAL  Final   Organism ID, Bacteria PSEUDOMONAS AERUGINOSA  Final   Organism ID, Bacteria STREPTOCOCCUS PNEUMONIAE  Final      Susceptibility   Pseudomonas aeruginosa - MIC*    CEFTAZIDIME 4 SENSITIVE Sensitive     CIPROFLOXACIN <=0.25 SENSITIVE Sensitive     GENTAMICIN <=1 SENSITIVE Sensitive     IMIPENEM 2 SENSITIVE Sensitive     PIP/TAZO 8 SENSITIVE Sensitive     CEFEPIME 2 SENSITIVE Sensitive     * FEW PSEUDOMONAS AERUGINOSA   Streptococcus pneumoniae - MIC*    ERYTHROMYCIN 4 RESISTANT Resistant     LEVOFLOXACIN 0.5 SENSITIVE Sensitive     VANCOMYCIN <=0.12 SENSITIVE Sensitive     PENO - penicillin <=0.06      PENICILLIN (non-meningitis) <=0.06 SENSITIVE Sensitive     PENICILLIN (oral) <=0.06 SENSITIVE Sensitive     CEFTRIAXONE (non-meningitis) <=0.12 SENSITIVE Sensitive     * FEW STREPTOCOCCUS PNEUMONIAE  Culture, blood (routine x 2)     Status: None   Collection Time: 01/05/21 11:44 AM   Specimen: BLOOD  Result Value Ref Range Status   Specimen Description BLOOD SITE NOT SPECIFIED  Final   Special Requests AEROBIC BOTTLE ONLY Blood Culture adequate volume  Final   Culture   Final    NO GROWTH 5 DAYS Performed at [facility], [address] [place], [place] [address]    Report Status 01/10/2021 FINAL  Final  Culture, blood (routine x 2)     Status: None  Collection Time: 01/05/21 11:44 AM   Specimen: BLOOD  Result Value Ref Range Status   Specimen Description BLOOD SITE NOT SPECIFIED  Final   Special Requests   Final    AEROBIC BOTTLE ONLY Blood Culture results may not be optimal due to an inadequate volume of blood received in culture bottles   Culture   Final    NO GROWTH 5 DAYS Performed at [facility], [address], [place], [place] [address]    Report Status 01/10/2021 FINAL  Final  Surgical PCR screen     Status: None   Collection Time: 01/08/21 12:14 AM   Specimen: Nasal Mucosa; Nasal Swab  Result Value Ref Range Status   MRSA,  PCR NEGATIVE NEGATIVE Final   Staphylococcus aureus NEGATIVE NEGATIVE Final    Comment: (NOTE) The Xpert SA Assay (FDA approved for NASAL specimens in patients 35 years of age and older), is one component of a comprehensive surveillance program. It is not intended to diagnose infection nor to guide or monitor treatment. Performed at [facility], [place] [address], [place], [place] [address]   Culture, Respiratory w Gram Stain     Status: None   Collection Time: 01/08/21  1:24 PM   Specimen: Tracheal Aspirate; Respiratory  Result Value Ref Range Status   Specimen Description TRACHEAL ASPIRATE  Final   Special Requests NONE  Final   Gram Stain   Final    RARE SQUAMOUS EPITHELIAL CELLS PRESENT MODERATE WBC PRESENT, PREDOMINANTLY MONONUCLEAR FEW GRAM NEGATIVE RODS Performed at [facility], [place] [address] [place], [place] [address]    Culture   Final    RARE PSEUDOMONAS AERUGINOSA RARE ENTEROCOCCUS FAECALIS    Report Status 01/11/2021 FINAL  Final   Organism ID, Bacteria PSEUDOMONAS AERUGINOSA  Final   Organism ID, Bacteria ENTEROCOCCUS FAECALIS  Final      Susceptibility   Enterococcus faecalis - MIC*    AMPICILLIN <=2 SENSITIVE Sensitive     VANCOMYCIN 1 SENSITIVE Sensitive     GENTAMICIN SYNERGY SENSITIVE Sensitive     * RARE ENTEROCOCCUS FAECALIS   Pseudomonas aeruginosa - MIC*    CEFTAZIDIME 4 SENSITIVE Sensitive     CIPROFLOXACIN <=0.25 SENSITIVE Sensitive     GENTAMICIN <=1 SENSITIVE Sensitive     IMIPENEM 2 SENSITIVE Sensitive     PIP/TAZO 8 SENSITIVE Sensitive     CEFEPIME 2 SENSITIVE Sensitive     * RARE PSEUDOMONAS AERUGINOSA  Gastrointestinal Panel by PCR , Stool     Status: None   Collection Time: 01/08/21  5:04 PM   Specimen: Stool  Result Value Ref Range Status   Campylobacter species NOT DETECTED NOT DETECTED Final   Plesimonas shigelloides NOT DETECTED NOT DETECTED Final   Salmonella species NOT DETECTED NOT DETECTED Final   Yersinia  enterocolitica NOT DETECTED NOT DETECTED Final   Vibrio species NOT DETECTED NOT DETECTED Final   Vibrio cholerae NOT DETECTED NOT DETECTED Final   Enteroaggregative E coli (EAEC) NOT DETECTED NOT DETECTED Final   Enteropathogenic E coli (EPEC) NOT DETECTED NOT DETECTED Final   Enterotoxigenic E coli (ETEC) NOT DETECTED NOT DETECTED Final   Shiga like toxin producing E coli (STEC) NOT DETECTED NOT DETECTED Final   Shigella/Enteroinvasive E coli (EIEC) NOT DETECTED NOT DETECTED Final   Cryptosporidium NOT DETECTED NOT DETECTED Final   Cyclospora cayetanensis NOT DETECTED NOT DETECTED Final   Entamoeba histolytica NOT DETECTED NOT DETECTED Final   Giardia lamblia NOT DETECTED  NOT DETECTED Final   Adenovirus F40/41 NOT DETECTED NOT DETECTED Final   Astrovirus NOT DETECTED NOT DETECTED Final   Norovirus GI/GII NOT DETECTED NOT DETECTED Final   Rotavirus A NOT DETECTED NOT DETECTED Final   Sapovirus (I, II, IV, and V) NOT DETECTED NOT DETECTED Final    Comment: Performed at [facility], [place], [place] [address]  C Difficile Quick Screen (NO PCR Reflex)     Status: None   Collection Time: 01/09/21 11:07 AM   Specimen: STOOL  Result Value Ref Range Status   C Diff antigen NEGATIVE NEGATIVE Final   C Diff toxin NEGATIVE NEGATIVE Final   C Diff interpretation No C. difficile detected.  Final    Comment: Performed at [facility], [place] [address], [place], [place] [address]    Anti-infectives:  Anti-infectives (From admission, onward)    Start     Dose/Rate Route Frequency Ordered Stop   01/11/21 2330  ceFEPIme (MAXIPIME) 2 g in sodium chloride 0.9 % 100 mL IVPB  Status:  Discontinued        2 g 200 mL/hr over 30 Minutes Intravenous Every 24 hours 01/11/21 0711 01/16/21 0907   01/10/21 1645  ampicillin (OMNIPEN) 2 g in sodium chloride 0.9 % 100 mL IVPB  Status:  Discontinued        2 g 300 mL/hr over 20 Minutes Intravenous Every 8 hours 01/10/21 1549  01/16/21 0907   01/09/21 2200  ceFEPIme (MAXIPIME) 2 g in sodium chloride 0.9 % 100 mL IVPB  Status:  Discontinued        2 g 200 mL/hr over 30 Minutes Intravenous Every 12 hours 01/09/21 1458 01/11/21 0711   01/08/21 1515  metroNIDAZOLE (FLAGYL) IVPB 500 mg  Status:  Discontinued        500 mg 100 mL/hr over 60 Minutes Intravenous Every 8 hours 01/08/21 1428 01/10/21 1618   01/03/21 0600  vancomycin (VANCOREADY) IVPB 1250 mg/250 mL  Status:  Discontinued        1,250 mg 166.7 mL/hr over 90 Minutes Intravenous Every 12 hours 01/02/21 1717 01/03/21 0837   01/02/21 1800  vancomycin (VANCOREADY) IVPB 2000 mg/400 mL        2,000 mg 200 mL/hr over 120 Minutes Intravenous  Once 01/02/21 1712 01/02/21 2007   01/02/21 0900  ceFEPIme (MAXIPIME) 2 g in sodium chloride 0.9 % 100 mL IVPB  Status:  Discontinued        2 g 200 mL/hr over 30 Minutes Intravenous Every 8 hours 01/02/21 0849 01/09/21 1458       Best Practice/Protocols:  VTE Prophylaxis: Direct Thrombin Inhibitor Intermittent Sedation  Consults: Treatment Team:  Izora Gala, MD Georganna Skeans, MD Roney Jaffe, MD    Studies:    Events:  Subjective:    Overnight Issues:   Objective:  Vital signs for last 24 hours: Temp:  [97.5 F (36.4 C)-98 F (36.7 C)] 97.5 F (36.4 C) (09/06 0729) Pulse Rate:  [76-135] 112 (09/06 0800) Resp:  [15-29] 27 (09/06 0800) BP: (97-188)/(59-93) 162/77 (09/06 0800) SpO2:  [93 %-100 %] 95 % (09/06 0800) FiO2 (%):  [40 %] 40 % (09/06 0805) Weight:  [125.2 kg] 125.2 kg (09/06 0500)  Hemodynamic parameters for last 24 hours: CVP:  [12 mmHg-26 mmHg] 22 mmHg  Intake/Output from previous day: 09/05 0701 - 09/06 0700 In: 3599.8 [I.V.:1289.8; NG/GT:2310] Out: 7160 [Stool:250]  Intake/Output this shift: Total I/O In: 112 [I.V.:47; NG/GT:65] Out: 270 [Other:270]  Vent settings for last 24 hours: Vent Mode: PRVC FiO2 (%):  [40 %] 40 % Set Rate:  [20 bmp] 20 bmp Vt Set:  [650 mL]  650 mL PEEP:  [5 cmH20] 5 cmH20 Pressure Support:  [10 cmH20] 10 cmH20 Plateau Pressure:  [22 cmH20-25 cmH20] 22 cmH20  Physical Exam:  General: no respiratory distress Neuro: F/C well HEENT/Neck: trach-clean, intact Resp: clear to auscultation bilaterally CVS: IRR GI: softer, NT Extremities: edema 1+  Results for orders placed or performed during the hospital encounter of 12/28/20 (from the past 24 hour(s))  Glucose, capillary     Status: Abnormal   Collection Time: 01/21/21 10:53 AM  Result Value Ref Range   Glucose-Capillary 163 (H) 70 - 99 mg/dL  Glucose, capillary     Status: Abnormal   Collection Time: 01/21/21  3:58 PM  Result Value Ref Range   Glucose-Capillary 151 (H) 70 - 99 mg/dL  Renal function panel (daily at 1600)     Status: Abnormal   Collection Time: 01/21/21  4:00 PM  Result Value Ref Range   Sodium 134 (L) 135 - 145 mmol/L   Potassium 4.0 3.5 - 5.1 mmol/L   Chloride 99 98 - 111 mmol/L   CO2 24 22 - 32 mmol/L   Glucose, Bld 218 (H) 70 - 99 mg/dL   BUN 61 (H) 8 - 23 mg/dL   Creatinine, Ser 2.19 (H) 0.61 - 1.24 mg/dL   Calcium 8.3 (L) 8.9 - 10.3 mg/dL   Phosphorus 3.8 2.5 - 4.6 mg/dL   Albumin 2.1 (L) 3.5 - 5.0 g/dL   GFR, Estimated 32 (L) >60 mL/min   Anion gap 11 5 - 15  Glucose, capillary     Status: Abnormal   Collection Time: 01/21/21  7:42 PM  Result Value Ref Range   Glucose-Capillary 181 (H) 70 - 99 mg/dL  Glucose, capillary     Status: Abnormal   Collection Time: 01/21/21 11:24 PM  Result Value Ref Range   Glucose-Capillary 140 (H) 70 - 99 mg/dL  Glucose, capillary     Status: Abnormal   Collection Time: 01/22/21  3:56 AM  Result Value Ref Range   Glucose-Capillary 152 (H) 70 - 99 mg/dL  CBC     Status: Abnormal   Collection Time: 01/22/21  5:00 AM  Result Value Ref Range   WBC 10.3 4.0 - 10.5 K/uL   RBC 2.83 (L) 4.22 - 5.81 MIL/uL   Hemoglobin 7.4 (L) 13.0 - 17.0 g/dL   HCT 24.5 (L) 39.0 - 52.0 %   MCV 86.6 80.0 - 100.0 fL   MCH 26.1  26.0 - 34.0 pg   MCHC 30.2 30.0 - 36.0 g/dL   RDW 19.6 (H) 11.5 - 15.5 %   Platelets 262 150 - 400 K/uL   nRBC 0.0 0.0 - 0.2 %  APTT     Status: Abnormal   Collection Time: 01/22/21  5:00 AM  Result Value Ref Range   aPTT 60 (H) 24 - 36 seconds  Triglycerides     Status: None   Collection Time: 01/22/21  5:00 AM  Result Value Ref Range   Triglycerides 145 <150 mg/dL  Renal function panel     Status: Abnormal   Collection Time: 01/22/21  5:00 AM  Result Value Ref Range   Sodium 134 (L) 135 - 145 mmol/L   Potassium 4.6 3.5 - 5.1 mmol/L   Chloride 100 98 - 111 mmol/L   CO2 24 22 - 32 mmol/L  Glucose, Bld 152 (H) 70 - 99 mg/dL   BUN 63 (H) 8 - 23 mg/dL   Creatinine, Ser 2.09 (H) 0.61 - 1.24 mg/dL   Calcium 8.4 (L) 8.9 - 10.3 mg/dL   Phosphorus 4.3 2.5 - 4.6 mg/dL   Albumin 2.1 (L) 3.5 - 5.0 g/dL   GFR, Estimated 33 (L) >60 mL/min   Anion gap 10 5 - 15  Magnesium     Status: Abnormal   Collection Time: 01/22/21  5:00 AM  Result Value Ref Range   Magnesium 2.7 (H) 1.7 - 2.4 mg/dL  Glucose, capillary     Status: Abnormal   Collection Time: 01/22/21  7:27 AM  Result Value Ref Range   Glucose-Capillary 135 (H) 70 - 99 mg/dL    Assessment & Plan: Present on Admission: **None**    LOS: 25 days   Additional comments:I reviewed the patient's new clinical lab test results. . Fall down stairs 8/12   VDRF - guaifenisen, wean, S/P trach 8/29 by Dr. Bobbye Morton. Weaning and trach collar as able. Agitated with wean this AM. Add klonopin. ID - off abx, no fevers, completed maxipime/ampicillin 8/31, CT A/P with ascending colitis and distention. Stool studies and C. dif are all negative, continue bowel regimen TBI/SAH/SDH - NSGY c/s, Dr. Annette Stable. Significant frontal lobe injuries. Keppra x7d for sz ppx. Occipital bone fx - NSGY c/s, Dr. Annette Stable Temporal bone fx extending into middle ear - ENT c/s, Dr. Constance Holster Right TM Rupture - ENT c/s, Dr. Constance Holster AFRVR -  cardene, hydral [phone].   ABL anemia -  stable Bilateral pulmonary embolism - bivalirudin AKI - CRRT per Renal. Tolerating fluid removal. Hx DM2 - resistant SSI, novolog q4, glargine  50u BID Hx HTN - PRN meds FEN - NPO, TF, add Klonopin VTE - SCDs, bival gtt Dispo - ICU, CRRT Critical Care Total Time*: 40 Minutes  Georganna Skeans, MD, MPH, FACS Trauma & General Surgery Use [email or web address] to contact on call provider  01/22/2021  *Care during the described time interval was provided by me. I have reviewed this patient's available data, including medical history, events of note, physical examination and test results as part of my evaluation.

## 2021-01-22 NOTE — Progress Notes (Signed)
 Progress Note  Patient Name: Angel [name] Date of Encounter: 01/22/2021  Primary Cardiologist:  A , MD   Subjective   No events overnight.  Continuing CRRT with improved Bicarb (Dc'ed).  Family has notes that he has been more alert and able to communicate.  Inpatient Medications    Scheduled Meds:  acetaminophen  1,000 mg Per Tube Q6H   chlorhexidine gluconate (MEDLINE KIT)  15 mL Mouth Rinse BID   Chlorhexidine Gluconate Cloth  6 each Topical Q0600   clonazePAM  0.5 mg Per Tube BID   docusate  100 mg Per Tube BID   feeding supplement (PROSource TF)  90 mL Per Tube BID   guaiFENesin  10 mL Per Tube Q4H   hydrALAZINE  10 mg Intravenous Once   hydrALAZINE  25 mg Per Tube Q8H   insulin aspart  0-20 Units Subcutaneous Q4H   insulin aspart  10 Units Subcutaneous Q4H   insulin glargine-yfgn  50 Units Subcutaneous BID   mouth rinse  15 mL Mouth Rinse 10 times per day   methocarbamol  1,000 mg Per Tube Q8H   pantoprazole sodium  40 mg Per Tube Daily   polyethylene glycol  17 g Per Tube Daily   QUEtiapine  100 mg Per Tube BID   senna  1 tablet Per Tube Daily   sodium chloride flush  10-40 mL Intracatheter Q12H   Continuous Infusions:   prismasol BGK 4/2.5 300 mL/hr at 01/21/21 2326    prismasol BGK 4/2.5 300 mL/hr at 01/22/21 0100   sodium chloride     amiodarone 30 mg/hr (01/22/21 1100)   bivalirudin (ANGIOMAX) infusion 0.5 mg/mL (Non-ACS indications) 0.02 mg/kg/hr (01/22/21 1100)   feeding supplement (PIVOT 1.5 CAL) 1,000 mL (01/21/21 1122)   niCARDipine 5 mg/hr (01/22/21 1100)   prismasol BGK 4/2.5 1,500 mL/hr at 01/22/21 1045   PRN Meds: Place/Maintain arterial line **AND** sodium chloride, artificial tears, heparin, hydrALAZINE, HYDROmorphone (DILAUDID) injection, midazolam, ondansetron **OR** ondansetron (ZOFRAN) IV, oxyCODONE, sodium chloride flush   Vital Signs    Vitals:   01/22/21 0900 01/22/21 0930 01/22/21 1000 01/22/21 1030  BP: (!) 150/72  117/71 139/75 136/75  Pulse: 98 96 (!) 126 (!) 128  Resp: 16 20 20 20  Temp:      TempSrc:      SpO2: 98% 98% 98% 98%  Weight:      Height:        Intake/Output Summary (Last 24 hours) at 01/22/2021 1110 Last data filed at 01/22/2021 1100 Gross per 24 hour  Intake 3447.81 ml  Output 6947 ml  Net -3499.19 ml   Filed Weights   01/19/21 0300 01/20/21 0400 01/22/21 0500  Weight: (!) 143.2 kg (!) 137.4 kg 125.2 kg    Telemetry    AF RVR to likely AFL (heart rate persistently 128)- Personally Reviewed  ECG    No new - Personally Reviewed  Physical Exam   GEN: trach tube in place Neck: unable to assess JVD Cardiac: IRIR no murmurs, rubs, or gallops.  Respiratory: scattered rales GI: Soft, dullness to percussion has improved MS: No edema; No deformity. Neuro:  Nonfocal  Psych: Not verbally responsive  Labs    Chemistry Recent Labs  Lab 01/21/21 0514 01/21/21 1600 01/22/21 0500  NA 135 134* 134*  K 4.5 4.0 4.6  CL 102 99 100  CO2 23 24 24  GLUCOSE 184* 218* 152*  BUN 68* 61* 63*  CREATININE 2.34* 2.19* 2.09*  CALCIUM 8.3* 8.3*   8.4*  ALBUMIN 2.0* 2.1* 2.1*  GFRNONAA 29* 32* 33*  ANIONGAP 10 11 10     Hematology Recent Labs  Lab 01/20/21 0513 01/21/21 0514 01/22/21 0500  WBC 14.2* 13.5* 10.3  RBC 3.23* 3.04* 2.83*  HGB 8.4* 7.9* 7.4*  HCT 27.2* 25.9* 24.5*  MCV 84.2 85.2 86.6  MCH 26.0 26.0 26.1  MCHC 30.9 30.5 30.2  RDW 19.8* 19.7* 19.6*  PLT 467* 387 262    Cardiac EnzymesNo results for input(s): TROPONINI in the last 168 hours. No results for input(s): TROPIPOC in the last 168 hours.   BNPNo results for input(s): BNP, PROBNP in the last 168 hours.   DDimer No results for input(s): DDIMER in the last 168 hours.   Radiology    No results found.  Cardiac Studies   Echo 01/07/21: 1. Left ventricular ejection fraction, by estimation, is 60 to 65%. The  left ventricle has normal function. The left ventricle has no regional  wall motion  abnormalities. There is mild left ventricular hypertrophy.  Left ventricular diastolic parameters  are indeterminate.   2. Right ventricule is poorly visualized but grossly normal size and  systolic function   3. Left atrial size was mildly dilated.   4. Right atrial size was mildly dilated.   5. The mitral valve is normal in structure. No evidence of mitral valve  regurgitation. No evidence of mitral stenosis.   6. The aortic valve was not well visualized. Aortic valve regurgitation  is not visualized. No aortic stenosis is present.   CTPE: Date: 01/08/21 Results: A. Bilateral PE B. 3V CAC and Aortic Atherosclerosis C. Multifocal lung consolidation D. Bilateral pleural effusion    Patient Profile     70 y.o. male admitted with subarachnoid hemorrhage after a fall with multilobar pneumonia, PE, sepsis, and new onset atrial fib with both tachy/brady. Through prolonged course has had AKI requiring CRRT  Assessment & Plan     New onset persistent AF in the setting of subarachnoid hemorrhage Acute bilateral PE - has tolerated AC, continue bivalirudin - Has had tachy brady complicated by chemical cardioversions through course; acidosis has also contributed to bradycardia - continue IV amiodarone; if hypotension or if patient converts stop carden drip first - presently faster heart rates are reactive and will tolerate higher heart rates ~ 128s - will get EKG  Hypoxic respiratory failure- s/p trach  AKI - on CRRT  CAC and Aortic atherosclerosis - at recovery post prolonged admission we can further address therapy     For questions or updates, please contact [facility] Please consult [email or web address] for contact info under Cardiology/STEMI.      Signed,  A , MD  01/22/2021, 11:10 AM    

## 2021-01-22 NOTE — Progress Notes (Signed)
[facility] NEPHROLOGY PROGRESS NOTE  Assessment/ Plan:  #Acute kidney injury, oliguric: Multifactorial etiology including ischemic ATN in the setting of hypotension, sepsis complicated by contrast injury.  Started CRRT on 9/1 and has been tolerating well. LIJ trialysis placed 9/1.  No heparin as he is on bivalirudin.   Remains anuric and electrolytes acceptable.  Tolerating UF around 100-200 cc/hour.  Continue current CRRT prescription.  Monitor labs, urine output.  #Fall/bilateral subarachnoid hemorrhage: Per trauma team.  #Acute respiratory failure: Currently on vent support.  #A. fib with RVR: Starting amiodarone.  # Anemia of critical illness: Transfuse as needed.  #Metabolic acidosis: Managed with dialysis.  Discontinued oral sodium bicarbonate.  #Bilateral pulm embolism: Currently on anticoagulation.  Subjective: Seen and examined.  Remains intubated, sedated.  Opens eyes with the name.  His wife at bedside.  No issue with CRRT.  Discussed with nurse. . Objective Vital signs in last 24 hours: Vitals:   01/22/21 1000 01/22/21 1030 01/22/21 1100 01/22/21 1118  BP: 139/75 136/75 131/79   Pulse: (!) 126 (!) 128 (!) 127   Resp: 20 20 (!) 21   Temp:    (!) 97.4 F (36.3 C)  TempSrc:    Axillary  SpO2: 98% 98% 99%   Weight:      Height:       Weight change:   Intake/Output Summary (Last 24 hours) at 01/22/2021 1136 Last data filed at 01/22/2021 1100 Gross per 24 hour  Intake 3447.81 ml  Output 6947 ml  Net -3499.19 ml        Labs: Basic Metabolic Panel: Recent Labs  Lab 01/21/21 0514 01/21/21 1600 01/22/21 0500  NA 135 134* 134*  K 4.5 4.0 4.6  CL 102 99 100  CO2 _0 GLUCOSE 184* 218* 152*  BUN 68* 61* 63*  CREATININE 2.34* 2.19* 2.09*  CALCIUM 8.3* 8.3* 8.4*  PHOS 4.3 3.8 4.3    Liver Function Tests: Recent Labs  Lab 01/21/21 0514 01/21/21 1600 01/22/21 0500  ALBUMIN 2.0* 2.1* 2.1*    No results for input(s): LIPASE,  AMYLASE in the last 168 hours. No results for input(s): AMMONIA in the last 168 hours. CBC: Recent Labs  Lab 01/18/21 0444 01/18/21 1551 01/19/21 0552 01/20/21 0513 01/21/21 0514 01/22/21 0500  WBC 10.2  --  13.3* 14.2* 13.5* 10.3  HGB 7.9*   < > 8.7* 8.4* 7.9* 7.4*  HCT 25.1*   < > 27.5* 27.2* 25.9* 24.5*  MCV 83.4  --  84.1 84.2 85.2 86.6  PLT 499*  --  530* 467* 387 262   < > = values in this interval not displayed.    Cardiac Enzymes: No results for input(s): CKTOTAL, CKMB, CKMBINDEX, TROPONINI in the last 168 hours. CBG: Recent Labs  Lab 01/21/21 1942 01/21/21 2324 01/22/21 0356 01/22/21 0727 01/22/21 1116  GLUCAP 181* 140* 152* 135* 160*     Iron Studies: No results for input(s): IRON, TIBC, TRANSFERRIN, FERRITIN in the last 72 hours. Studies/Results: No results found.  Medications: Infusions:   prismasol BGK 4/2.5 300 mL/hr at 01/21/21 2326    prismasol BGK 4/2.5 300 mL/hr at 01/22/21 0100   sodium chloride     amiodarone 30 mg/hr (01/22/21 1100)   bivalirudin (ANGIOMAX) infusion 0.5 mg/mL (Non-ACS indications) 0.02 mg/kg/hr (01/22/21 1100)   feeding supplement (PIVOT 1.5 CAL) 1,000 mL (01/21/21 1122)   niCARDipine 5 mg/hr (01/22/21 1100)   prismasol BGK 4/2.5 1,500 mL/hr at 01/22/21 1045    Scheduled  Medications:  acetaminophen  1,000 mg Per Tube Q6H   chlorhexidine gluconate (MEDLINE KIT)  15 mL Mouth Rinse BID   Chlorhexidine Gluconate Cloth  6 each Topical Q0600   clonazePAM  0.5 mg Per Tube BID   docusate  100 mg Per Tube BID   feeding supplement (PROSource TF)  90 mL Per Tube BID   guaiFENesin  10 mL Per Tube Q4H   hydrALAZINE  10 mg Intravenous Once   hydrALAZINE  25 mg Per Tube Q8H   insulin aspart  0-20 Units Subcutaneous Q4H   insulin aspart  10 Units Subcutaneous Q4H   insulin glargine-yfgn  50 Units Subcutaneous BID   mouth rinse  15 mL Mouth Rinse 10 times per day   methocarbamol  1,000 mg Per Tube Q8H   pantoprazole sodium  40 mg  Per Tube Daily   polyethylene glycol  17 g Per Tube Daily   QUEtiapine  100 mg Per Tube BID   senna  1 tablet Per Tube Daily   sodium chloride flush  10-40 mL Intracatheter Q12H    have reviewed scheduled and prn medications.  Physical Exam: General: Critically ill looking male, trached, sedated.  Opens eyes with the name Heart: Tachycardic, s1s2 nl Lungs: Coarse breath sound bilateral. Abdomen:soft, nontender. Extremities: edema+ Dialysis Access: Left IJ temporary HD catheter    Tanna Furry 01/22/2021,11:36 AM  LOS: 25 days

## 2021-01-22 NOTE — Progress Notes (Signed)
Occupational Therapy Treatment Patient Details Name: Angel [name] MRN: [id number] DOB: 05-27-1950 Today's Date: 01/22/2021    History of present illness Angel [name] is a 70 y.o. 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 CT showed R temporal and parietal SAH, SAH anterior frontal lobes  bilaterally. Also sustained right occipital skull fracture, temporal bone fx, right TM rupture, bilateral PE. Intubated 8/12, trach'd 8/29. PMH: DM2, HTN   OT comments  Pt progressed to chair position with CRRT running and PRV vent support with increased arousal following simple commands. Pt noted to have some movement to the trach and RN secured trach collar tighter with some reduction in patient coughing initially. Pt requires slow progression with noted increased RR and BP initially. Recommendation remain CIR as pt progressing.    Follow Up Recommendations  CIR    Equipment Recommendations  3 in 1 bedside commode;Wheelchair (measurements OT);Wheelchair cushion (measurements OT);Hospital bed    Recommendations for Other Services Rehab consult    Precautions / Restrictions Precautions Precautions: Fall Precaution Comments: cortrak, trach vent CRRT purewick male flexiseal       Mobility Bed Mobility Overal bed mobility: Needs Assistance             General bed mobility comments: chair position with BIL LE not in full flexion, pt HR 99-103 sitting, RR increased couging BP increased, Session limited to patient tolerance between coughing/ RR increases. Pt requires two people to manage all lines/ leads and monitor vitals. pt moving bil UE in restless behavior to increased arousal    Transfers                 General transfer comment: deferred bed level only    Balance                                           ADL either performed or assessed with clinical judgement   ADL Overall ADL's : Needs assistance/impaired                                        General ADL Comments: total (A) for all care. pt more aroused with HOB in chair position and coughing. RN address BP increase and vent needs. CRRT sounding and needing lines adjusted on bed by RN     Vision       Perception     Praxis      Cognition Arousal/Alertness: Lethargic Behavior During Therapy: Flat affect Overall Cognitive Status: Difficult to assess Area of Impairment: Rancho level               Rancho Levels of Cognitive Functioning Rancho Los Amigos Scales of Cognitive Functioning: Localized response (showing potential higher level with simple command following)               General Comments: following simple commands with R hand , thumbs up two fingers squeeze hand        Exercises Other Exercises Other Exercises: AROM bil UEs demonstrated   Shoulder Instructions       General Comments BP 182/103 increased 193/87 Rn gave medication 157/81 (100) with prolonged chair position PRVC VENT 40% FIO2 Peep 5 HR 121    Pertinent Vitals/ Pain       Pain Assessment: Faces Faces  Pain Scale: Hurts a little bit Pain Location: generalized Pain Intervention(s): Monitored during session;Repositioned;RN gave pain meds during session (restless)  Home Living                                          Prior Functioning/Environment              Frequency  Min 2X/week        Progress Toward Goals  OT Goals(current goals can now be found in the care plan section)  Progress towards OT goals: Progressing toward goals  Acute Rehab OT Goals Patient Stated Goal: unable to state OT Goal Formulation: Patient unable to participate in goal setting Time For Goal Achievement: 01/30/21 Potential to Achieve Goals: Good ADL Goals Additional ADL Goal #1: pt will follow 1 step command 25% of session Additional ADL Goal #2: pt will visually track therapist in room for 25% of session Additional ADL Goal #3: pt will tolerate static  sitting max (A) for 10 minutes ( stable VSS)  Plan Discharge plan remains appropriate;Frequency needs to be updated    Co-evaluation    PT/OT/SLP Co-Evaluation/Treatment: Yes Reason for Co-Treatment: Complexity of the patient's impairments (multi-system involvement);Necessary to address cognition/behavior during functional activity;For patient/therapist safety;To address functional/ADL transfers   OT goals addressed during session: ADL's and self-care;Proper use of Adaptive equipment and DME;Strengthening/ROM      AM-PAC OT "6 Clicks" Daily Activity     Outcome Measure   Help from another person eating meals?: Total Help from another person taking care of personal grooming?: Total Help from another person toileting, which includes using toliet, bedpan, or urinal?: Total Help from another person bathing (including washing, rinsing, drying)?: Total Help from another person to put on and taking off regular upper body clothing?: Total Help from another person to put on and taking off regular lower body clothing?: Total 6 Click Score: 6    End of Session Equipment Utilized During Treatment: Oxygen  OT Visit Diagnosis: Unsteadiness on feet (R26.81);Muscle weakness (generalized) (M62.81)   Activity Tolerance Patient tolerated treatment well   Patient Left in bed;with call bell/phone within reach;with bed alarm set;with nursing/sitter in room;with family/visitor present   Nurse Communication Mobility status;Precautions        Time: 5701-7793 OT Time Calculation (min): 25 min  Charges: OT General Charges $OT Visit: 1 Visit OT Treatments $Therapeutic Exercise: 8-22 mins   Brynn, OTR/L  Acute Rehabilitation Services Pager: [phone] Office: [phone] .    Jeri Modena 01/22/2021, 1:09 PM

## 2021-01-22 NOTE — Progress Notes (Signed)
Physical Therapy Treatment Patient Details Name: Angel [name] MRN: [id number] DOB: [birth year] Today's Date: 01/22/2021    History of Present Illness Angel [name] is a 70 y.o. 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 CT showed R temporal and parietal SAH, SAH anterior frontal lobes  bilaterally. Also sustained right occipital skull fracture, temporal bone fx, right TM rupture, bilateral PE. Intubated 8/12, trach'd 8/29. PMH: DM2, HTN    PT Comments    Pt opening eyes to name and followed simple commands with R hand today however due to excessive coughing, increased HR, increased RR and BP pt limited to using egress to attempt sitting however with HOB at 60 deg pt unable to tolerate due to noted symptoms above. Pt did keep eyes open and followed commands initially but then was unable due to excessive coughing. PT to return as able to progress mobility as appropriate.    Follow Up Recommendations  CIR     Equipment Recommendations  Other (comment) (TBD)    Recommendations for Other Services Rehab consult     Precautions / Restrictions Precautions Precautions: Fall Precaution Comments: cortrak, trach vent CRRT purewick male flexiseal Restrictions Weight Bearing Restrictions: No    Mobility  Bed Mobility Overal bed mobility: Needs Assistance Bed Mobility:  (used egress in the bed to position pt in upright chair position , pt did open eyes upon raising bed, HR and BP increasing with noted work of breathing, returned pt to 45 deg elevation at [facility], RN present and increased BP meds as well)           General bed mobility comments: chair position with BIL LE not in full flexion, pt HR 99-103 sitting, RR increased couging BP increased, Session limited to patient tolerance between coughing/ RR increases. Pt requires two people to manage all lines/ leads and monitor vitals. pt moving bil UE in restless behavior to increased arousal    Transfers                 General  transfer comment: deferred bed level only  Ambulation/Gait             General Gait Details: unable at this time   Stairs             Wheelchair Mobility    Modified Rankin (Stroke Patients Only) Modified Rankin (Stroke Patients Only) Pre-Morbid Rankin Score: No symptoms Modified Rankin: Severe disability     Balance Overall balance assessment: Needs assistance Sitting-balance support: Feet unsupported;No upper extremity supported Sitting balance-Leahy Scale: Zero Sitting balance - Comments: dependent on egress in bed for support                                    Cognition Arousal/Alertness: Lethargic (but opened eyes when HOB raised to 60 deg) Behavior During Therapy: Flat affect Overall Cognitive Status: Difficult to assess Area of Impairment: Rancho level               Rancho Levels of Cognitive Functioning Rancho Los Amigos Scales of Cognitive Functioning: Localized response (showing potential higher level with simple command following)               General Comments: following simple commands with R hand , thumbs up two fingers squeeze hand, pt constantly coughing limiting pt ability to focus      Exercises Other Exercises Other Exercises: passive bilat LE ROM  General Comments General comments (skin integrity, edema, etc.): BP 256 systolic, HR varying from 70s to 130s      Pertinent Vitals/Pain Pain Assessment: Faces Faces Pain Scale: Hurts a little bit Pain Location: generalized Pain Descriptors / Indicators: Discomfort;Other (Comment) (fidgeting in bed) Pain Intervention(s): Monitored during session    Home Living                      Prior Function            PT Goals (current goals can now be found in the care plan section) Acute Rehab PT Goals Patient Stated Goal: unable to state PT Goal Formulation: With patient/family Time For Goal Achievement: 02/07/21 Potential to Achieve Goals:  Fair Progress towards PT goals: Not progressing toward goals - comment    Frequency    Min 2X/week      PT Plan Frequency needs to be updated    Co-evaluation PT/OT/SLP Co-Evaluation/Treatment: Yes Reason for Co-Treatment: Complexity of the patient's impairments (multi-system involvement) PT goals addressed during session: Mobility/safety with mobility OT goals addressed during session: ADL's and self-care;Proper use of Adaptive equipment and DME;Strengthening/ROM      AM-PAC PT "6 Clicks" Mobility   Outcome Measure  Help needed turning from your back to your side while in a flat bed without using bedrails?: Total Help needed moving from lying on your back to sitting on the side of a flat bed without using bedrails?: Total Help needed moving to and from a bed to a chair (including a wheelchair)?: Total Help needed standing up from a chair using your arms (e.g., wheelchair or bedside chair)?: Total Help needed to walk in hospital room?: Total Help needed climbing 3-5 steps with a railing? : Total 6 Click Score: 6    End of Session Equipment Utilized During Treatment: Oxygen (vent) Activity Tolerance: Treatment limited secondary to medical complications (Comment) (decreased responsiveness, CRRT, inc RR, HR and BP) Patient left: in bed;with nursing/sitter in room Nurse Communication: Mobility status PT Visit Diagnosis: Unsteadiness on feet (R26.81);Muscle weakness (generalized) (M62.81);Difficulty in walking, not elsewhere classified (R26.2)     Time: 3893-7342 PT Time Calculation (min) (ACUTE ONLY): 24 min  Charges:  $Therapeutic Activity: 8-22 mins                     Kittie Plater, PT, DPT Acute Rehabilitation Services Pager #: [phone] Office #: [phone]    Berline Lopes 01/22/2021, 2:35 PM

## 2021-01-23 ENCOUNTER — Inpatient Hospital Stay (HOSPITAL_COMMUNITY): Payer: PPO

## 2021-01-23 DIAGNOSIS — I609 Nontraumatic subarachnoid hemorrhage, unspecified: Secondary | ICD-10-CM | POA: Diagnosis not present

## 2021-01-23 DIAGNOSIS — I2699 Other pulmonary embolism without acute cor pulmonale: Secondary | ICD-10-CM | POA: Diagnosis not present

## 2021-01-23 DIAGNOSIS — I480 Paroxysmal atrial fibrillation: Secondary | ICD-10-CM | POA: Diagnosis not present

## 2021-01-23 LAB — GLUCOSE, CAPILLARY
Glucose-Capillary: 105 mg/dL — ABNORMAL HIGH (ref 70–99)
Glucose-Capillary: 119 mg/dL — ABNORMAL HIGH (ref 70–99)
Glucose-Capillary: 119 mg/dL — ABNORMAL HIGH (ref 70–99)
Glucose-Capillary: 122 mg/dL — ABNORMAL HIGH (ref 70–99)
Glucose-Capillary: 76 mg/dL (ref 70–99)
Glucose-Capillary: 90 mg/dL (ref 70–99)

## 2021-01-23 LAB — RENAL FUNCTION PANEL
Albumin: 2.2 g/dL — ABNORMAL LOW (ref 3.5–5.0)
Albumin: 2.3 g/dL — ABNORMAL LOW (ref 3.5–5.0)
Anion gap: 11 (ref 5–15)
Anion gap: 9 (ref 5–15)
BUN: 57 mg/dL — ABNORMAL HIGH (ref 8–23)
BUN: 65 mg/dL — ABNORMAL HIGH (ref 8–23)
CO2: 25 mmol/L (ref 22–32)
CO2: 24 mmol/L (ref 22–32)
Calcium: 9 mg/dL (ref 8.9–10.3)
Calcium: 8.7 mg/dL — ABNORMAL LOW (ref 8.9–10.3)
Chloride: 97 mmol/L — ABNORMAL LOW (ref 98–111)
Chloride: 101 mmol/L (ref 98–111)
Creatinine, Ser: 1.82 mg/dL — ABNORMAL HIGH (ref 0.61–1.24)
Creatinine, Ser: 1.9 mg/dL — ABNORMAL HIGH (ref 0.61–1.24)
GFR, Estimated: 39 mL/min — ABNORMAL LOW (ref >60)
GFR, Estimated: 37 mL/min — ABNORMAL LOW (ref >60)
Glucose, Bld: 132 mg/dL — ABNORMAL HIGH (ref 70–99)
Glucose, Bld: 135 mg/dL — ABNORMAL HIGH (ref 70–99)
Phosphorus: 4.1 mg/dL (ref 2.5–4.6)
Phosphorus: 4.3 mg/dL (ref 2.5–4.6)
Potassium: 4.5 mmol/L (ref 3.5–5.1)
Potassium: 4.7 mmol/L (ref 3.5–5.1)
Sodium: 133 mmol/L — ABNORMAL LOW (ref 135–145)
Sodium: 134 mmol/L — ABNORMAL LOW (ref 135–145)

## 2021-01-23 LAB — APTT: aPTT: 57 s — ABNORMAL HIGH (ref 24–36)

## 2021-01-23 LAB — CBC
HCT: 24.1 % — ABNORMAL LOW (ref 39.0–52.0)
Hemoglobin: 7.5 g/dL — ABNORMAL LOW (ref 13.0–17.0)
MCH: 26.8 pg (ref 26.0–34.0)
MCHC: 31.1 g/dL (ref 30.0–36.0)
MCV: 86.1 fL (ref 80.0–100.0)
Platelets: 205 10*3/uL (ref 150–400)
RBC: 2.8 MIL/uL — ABNORMAL LOW (ref 4.22–5.81)
RDW: 19.1 % — ABNORMAL HIGH (ref 11.5–15.5)
WBC: 11.2 10*3/uL — ABNORMAL HIGH (ref 4.0–10.5)
nRBC: 0.2 % (ref 0.0–0.2)

## 2021-01-23 LAB — MAGNESIUM: Magnesium: 2.8 mg/dL — ABNORMAL HIGH (ref 1.7–2.4)

## 2021-01-23 NOTE — Progress Notes (Signed)
Patient ID: Angel [name], male   DOB: 1950/06/17, 70 y.o.   MRN: [id number] Follow up - Trauma Critical Care  Patient Details:    Angel [name] is an 70 y.o. male.  Lines/tubes : PICC Triple Lumen 95/62/13 PICC Right Basilic 47 cm 1 cm (Active)  Indication for Insertion or Continuance of Line Poor Vasculature-patient has had multiple peripheral attempts or PIVs lasting less than 24 hours;Prolonged intravenous therapies 01/22/21 2000  Exposed Catheter (cm) 1 cm 01/09/21 0916  Site Assessment Clean;Intact 01/22/21 2000  Lumen #1 Status Infusing;Flushed 01/22/21 2000  Lumen #2 Status Infusing;Flushed 01/22/21 2000  Lumen #3 Status Infusing;Flushed 01/22/21 2000  Dressing Type Transparent;Securing device 01/22/21 2000  Dressing Status Clean;Dry;Intact 01/22/21 2000  Antimicrobial disc in place? Yes 01/22/21 2000  Safety Lock Not Applicable 08/65/78 4696  Line Care Connections checked and tightened 01/22/21 0721  Dressing Intervention New dressing;Other (Comment) 01/09/21 0916  Dressing Change Due 01/24/21 01/22/21 2000     External Urinary Catheter (Active)  Collection Container Standard drainage bag 01/22/21 2000  Suction (Verified suction is between 40-80 mmHg) N/A (Patient has condom catheter) 01/22/21 2000  Securement Method Securing device (Describe) 01/22/21 2000  Site Assessment Clean;Intact 01/22/21 2000  Intervention Male External Urinary Catheter Replaced 01/21/21 2000  Output (mL) 0 mL 01/23/21 0000     Fecal Management System (Active)  Does patient meet criteria for removal? No 01/22/21 2000  Daily care Skin around tube assessed 01/22/21 2000  Output (mL) 50 mL 01/23/21 0553  Intake (mL) 30 mL 01/06/21 0520    Microbiology/Sepsis markers: Results for orders placed or performed during the hospital encounter of 12/28/20  Resp Panel by RT-PCR (Flu A&B, Covid) Nasopharyngeal Swab     Status: None   Collection Time: 12/28/20  4:17 PM   Specimen: Nasopharyngeal Swab;  Nasopharyngeal(NP) swabs in vial transport medium  Result Value Ref Range Status   SARS Coronavirus 2 by RT PCR NEGATIVE NEGATIVE Final    Comment: (NOTE) SARS-CoV-2 target nucleic acids are NOT DETECTED.  The SARS-CoV-2 RNA is generally detectable in upper respiratory specimens during the acute phase of infection. The lowest concentration of SARS-CoV-2 viral copies this assay can detect is 138 copies/mL. A negative result does not preclude SARS-Cov-2 infection and should not be used as the sole basis for treatment or other patient management decisions. A negative result may occur with  improper specimen collection/handling, submission of specimen other than nasopharyngeal swab, presence of viral mutation(s) within the areas targeted by this assay, and inadequate number of viral copies(<138 copies/mL). A negative result must be combined with clinical observations, patient history, and epidemiological information. The expected result is Negative.  Fact Sheet for Patients:  EntrepreneurPulse.com.au  Fact Sheet for Healthcare Providers:  IncredibleEmployment.be  This test is no t yet approved or cleared by the [place] FDA and  has been authorized for detection and/or diagnosis of SARS-CoV-2 by FDA under an Emergency Use Authorization (EUA). This EUA will remain  in effect (meaning this test can be used) for the duration of the COVID-19 declaration under Section 564(b)(1) of the Act, 21 U.S.C.section 360bbb-3(b)(1), unless the authorization is terminated  or revoked sooner.       Influenza A by PCR NEGATIVE NEGATIVE Final   Influenza B by PCR NEGATIVE NEGATIVE Final    Comment: (NOTE) The Xpert Xpress SARS-CoV-2/FLU/RSV plus assay is intended as an aid in the diagnosis of influenza from Nasopharyngeal swab specimens and should not be used as a sole basis  for treatment. Nasal washings and aspirates are unacceptable for Xpert Xpress  SARS-CoV-2/FLU/RSV testing.  Fact Sheet for Patients: EntrepreneurPulse.com.au  Fact Sheet for Healthcare Providers: IncredibleEmployment.be  This test is not yet approved or cleared by the [place] FDA and has been authorized for detection and/or diagnosis of SARS-CoV-2 by FDA under an Emergency Use Authorization (EUA). This EUA will remain in effect (meaning this test can be used) for the duration of the COVID-19 declaration under Section 564(b)(1) of the Act, 21 U.S.C. section 360bbb-3(b)(1), unless the authorization is terminated or revoked.  Performed at [facility], [place] [address] [place], [place] [address]   MRSA Next Gen by PCR, Nasal     Status: None   Collection Time: 12/28/20  7:32 PM   Specimen: Nasal Mucosa; Nasal Swab  Result Value Ref Range Status   MRSA by PCR Next Gen NOT DETECTED NOT DETECTED Final    Comment: (NOTE) The GeneXpert MRSA Assay (FDA approved for NASAL specimens only), is one component of a comprehensive MRSA colonization surveillance program. It is not intended to diagnose MRSA infection nor to guide or monitor treatment for MRSA infections. Test performance is not FDA approved in patients less than 22 years old. Performed at [facility], [place] [address], [place], [place] [address]   Culture, Respiratory w Gram Stain     Status: None   Collection Time: 12/31/20 11:06 AM   Specimen: Tracheal Aspirate; Respiratory  Result Value Ref Range Status   Specimen Description TRACHEAL ASPIRATE  Final   Special Requests NONE  Final   Gram Stain   Final    FEW SQUAMOUS EPITHELIAL CELLS PRESENT FEW WBC PRESENT,BOTH PMN AND MONONUCLEAR FEW GRAM POSITIVE COCCI Performed at [facility], [place] [address], [place], [place] [address]    Culture   Final    FEW PSEUDOMONAS AERUGINOSA FEW STREPTOCOCCUS PNEUMONIAE    Report Status 01/03/2021 FINAL  Final   Organism ID, Bacteria PSEUDOMONAS AERUGINOSA   Final   Organism ID, Bacteria STREPTOCOCCUS PNEUMONIAE  Final      Susceptibility   Pseudomonas aeruginosa - MIC*    CEFTAZIDIME 4 SENSITIVE Sensitive     CIPROFLOXACIN <=0.25 SENSITIVE Sensitive     GENTAMICIN <=1 SENSITIVE Sensitive     IMIPENEM 2 SENSITIVE Sensitive     PIP/TAZO 8 SENSITIVE Sensitive     CEFEPIME 2 SENSITIVE Sensitive     * FEW PSEUDOMONAS AERUGINOSA   Streptococcus pneumoniae - MIC*    ERYTHROMYCIN 4 RESISTANT Resistant     LEVOFLOXACIN 0.5 SENSITIVE Sensitive     VANCOMYCIN <=0.12 SENSITIVE Sensitive     PENO - penicillin <=0.06      PENICILLIN (non-meningitis) <=0.06 SENSITIVE Sensitive     PENICILLIN (oral) <=0.06 SENSITIVE Sensitive     CEFTRIAXONE (non-meningitis) <=0.12 SENSITIVE Sensitive     * FEW STREPTOCOCCUS PNEUMONIAE  Culture, blood (routine x 2)     Status: None   Collection Time: 01/05/21 11:44 AM   Specimen: BLOOD  Result Value Ref Range Status   Specimen Description BLOOD SITE NOT SPECIFIED  Final   Special Requests AEROBIC BOTTLE ONLY Blood Culture adequate volume  Final   Culture   Final    NO GROWTH 5 DAYS Performed at [facility], [address] [place],  [address]    Report Status 01/10/2021 FINAL  Final  Culture, blood (routine x 2)     Status: None   Collection Time: 01/05/21 11:44 AM  Specimen: BLOOD  Result Value Ref Range Status   Specimen Description BLOOD SITE NOT SPECIFIED  Final   Special Requests   Final    AEROBIC BOTTLE ONLY Blood Culture results may not be optimal due to an inadequate volume of blood received in culture bottles   Culture   Final    NO GROWTH 5 DAYS Performed at [facility], [place] [address] [place], [place] [address]    Report Status 01/10/2021 FINAL  Final  Surgical PCR screen     Status: None   Collection Time: 01/08/21 12:14 AM   Specimen: Nasal Mucosa; Nasal Swab  Result Value Ref Range Status   MRSA, PCR NEGATIVE NEGATIVE Final   Staphylococcus aureus NEGATIVE NEGATIVE  Final    Comment: (NOTE) The Xpert SA Assay (FDA approved for NASAL specimens in patients 81 years of age and older), is one component of a comprehensive surveillance program. It is not intended to diagnose infection nor to guide or monitor treatment. Performed at [facility], [place] [address], [place], [place] [address]   Culture, Respiratory w Gram Stain     Status: None   Collection Time: 01/08/21  1:24 PM   Specimen: Tracheal Aspirate; Respiratory  Result Value Ref Range Status   Specimen Description TRACHEAL ASPIRATE  Final   Special Requests NONE  Final   Gram Stain   Final    RARE SQUAMOUS EPITHELIAL CELLS PRESENT MODERATE WBC PRESENT, PREDOMINANTLY MONONUCLEAR FEW GRAM NEGATIVE RODS Performed at [facility], [place] [address], [place], [place] [address]    Culture   Final    RARE PSEUDOMONAS AERUGINOSA RARE ENTEROCOCCUS FAECALIS    Report Status 01/11/2021 FINAL  Final   Organism ID, Bacteria PSEUDOMONAS AERUGINOSA  Final   Organism ID, Bacteria ENTEROCOCCUS FAECALIS  Final      Susceptibility   Enterococcus faecalis - MIC*    AMPICILLIN <=2 SENSITIVE Sensitive     VANCOMYCIN 1 SENSITIVE Sensitive     GENTAMICIN SYNERGY SENSITIVE Sensitive     * RARE ENTEROCOCCUS FAECALIS   Pseudomonas aeruginosa - MIC*    CEFTAZIDIME 4 SENSITIVE Sensitive     CIPROFLOXACIN <=0.25 SENSITIVE Sensitive     GENTAMICIN <=1 SENSITIVE Sensitive     IMIPENEM 2 SENSITIVE Sensitive     PIP/TAZO 8 SENSITIVE Sensitive     CEFEPIME 2 SENSITIVE Sensitive     * RARE PSEUDOMONAS AERUGINOSA  Gastrointestinal Panel by PCR , Stool     Status: None   Collection Time: 01/08/21  5:04 PM   Specimen: Stool  Result Value Ref Range Status   Campylobacter species NOT DETECTED NOT DETECTED Final   Plesimonas shigelloides NOT DETECTED NOT DETECTED Final   Salmonella species NOT DETECTED NOT DETECTED Final   Yersinia enterocolitica NOT DETECTED NOT DETECTED Final   Vibrio species NOT DETECTED  NOT DETECTED Final   Vibrio cholerae NOT DETECTED NOT DETECTED Final   Enteroaggregative E coli (EAEC) NOT DETECTED NOT DETECTED Final   Enteropathogenic E coli (EPEC) NOT DETECTED NOT DETECTED Final   Enterotoxigenic E coli (ETEC) NOT DETECTED NOT DETECTED Final   Shiga like toxin producing E coli (STEC) NOT DETECTED NOT DETECTED Final   Shigella/Enteroinvasive E coli (EIEC) NOT DETECTED NOT DETECTED Final   Cryptosporidium NOT DETECTED NOT DETECTED Final   Cyclospora cayetanensis NOT DETECTED NOT DETECTED Final   Entamoeba histolytica NOT DETECTED NOT DETECTED Final   Giardia lamblia NOT DETECTED NOT DETECTED Final   Adenovirus F40/41  NOT DETECTED NOT DETECTED Final   Astrovirus NOT DETECTED NOT DETECTED Final   Norovirus GI/GII NOT DETECTED NOT DETECTED Final   Rotavirus A NOT DETECTED NOT DETECTED Final   Sapovirus (I, II, IV, and V) NOT DETECTED NOT DETECTED Final    Comment: Performed at [facility], [place], [place] [address]  C Difficile Quick Screen (NO PCR Reflex)     Status: None   Collection Time: 01/09/21 11:07 AM   Specimen: STOOL  Result Value Ref Range Status   C Diff antigen NEGATIVE NEGATIVE Final   C Diff toxin NEGATIVE NEGATIVE Final   C Diff interpretation No C. difficile detected.  Final    Comment: Performed at [facility], [place] [address] [place], [place] [address]    Anti-infectives:  Anti-infectives (From admission, onward)    Start     Dose/Rate Route Frequency Ordered Stop   01/11/21 2330  ceFEPIme (MAXIPIME) 2 g in sodium chloride 0.9 % 100 mL IVPB  Status:  Discontinued        2 g 200 mL/hr over 30 Minutes Intravenous Every 24 hours 01/11/21 0711 01/16/21 0907   01/10/21 1645  ampicillin (OMNIPEN) 2 g in sodium chloride 0.9 % 100 mL IVPB  Status:  Discontinued        2 g 300 mL/hr over 20 Minutes Intravenous Every 8 hours 01/10/21 1549 01/16/21 0907   01/09/21 2200  ceFEPIme (MAXIPIME) 2 g in sodium chloride 0.9 %  100 mL IVPB  Status:  Discontinued        2 g 200 mL/hr over 30 Minutes Intravenous Every 12 hours 01/09/21 1458 01/11/21 0711   01/08/21 1515  metroNIDAZOLE (FLAGYL) IVPB 500 mg  Status:  Discontinued        500 mg 100 mL/hr over 60 Minutes Intravenous Every 8 hours 01/08/21 1428 01/10/21 1618   01/03/21 0600  vancomycin (VANCOREADY) IVPB 1250 mg/250 mL  Status:  Discontinued        1,250 mg 166.7 mL/hr over 90 Minutes Intravenous Every 12 hours 01/02/21 1717 01/03/21 0837   01/02/21 1800  vancomycin (VANCOREADY) IVPB 2000 mg/400 mL        2,000 mg 200 mL/hr over 120 Minutes Intravenous  Once 01/02/21 1712 01/02/21 2007   01/02/21 0900  ceFEPIme (MAXIPIME) 2 g in sodium chloride 0.9 % 100 mL IVPB  Status:  Discontinued        2 g 200 mL/hr over 30 Minutes Intravenous Every 8 hours 01/02/21 0849 01/09/21 1458     Consults: Treatment Team:  Izora Gala, MD Georganna Skeans, MD Roney Jaffe, MD    Subjective:    Overnight Issues:   Objective:  Vital signs for last 24 hours: Temp:  [97.4 F (36.3 C)-97.7 F (36.5 C)] 97.5 F (36.4 C) (09/07 0723) Pulse Rate:  [39-138] 49 (09/07 0700) Resp:  [12-31] 19 (09/07 0700) BP: (92-159)/(53-113) 128/72 (09/07 0700) SpO2:  [93 %-100 %] 100 % (09/07 0700) FiO2 (%):  [40 %] 40 % (09/07 0753) Weight:  [120.7 kg] 120.7 kg (09/07 0500)  Hemodynamic parameters for last 24 hours: CVP:  [17 mmHg-26 mmHg] 25 mmHg  Intake/Output from previous day: 09/06 0701 - 09/07 0700 In: 3033.5 [I.V.:998.5; NG/GT:2035] Out: 6896 [Stool:50]  Intake/Output this shift: No intake/output data recorded.  Vent settings for last 24 hours: Vent Mode: CPAP;PSV FiO2 (%):  [40 %] 40 % Set Rate:  [20 bmp] 20 bmp Vt Set:  [650 mL] 650 mL  PEEP:  [5 cmH20] 5 cmH20 Pressure Support:  [5 cmH20] 5 cmH20 Plateau Pressure:  [19 QIO96-29 cmH20] 20 cmH20  Physical Exam:  General: vent wean Neuro: awake and F/C well HEENT/Neck: trach-clean, intact Resp: clear  to auscultation bilaterally CVS: IRR GI: soft, NT Extremities: mild edema  Results for orders placed or performed during the hospital encounter of 12/28/20 (from the past 24 hour(s))  Glucose, capillary     Status: Abnormal   Collection Time: 01/22/21 11:16 AM  Result Value Ref Range   Glucose-Capillary 160 (H) 70 - 99 mg/dL  Glucose, capillary     Status: Abnormal   Collection Time: 01/22/21  3:17 PM  Result Value Ref Range   Glucose-Capillary 130 (H) 70 - 99 mg/dL  Renal function panel (daily at 1600)     Status: Abnormal   Collection Time: 01/22/21  6:54 PM  Result Value Ref Range   Sodium 136 135 - 145 mmol/L   Potassium 4.6 3.5 - 5.1 mmol/L   Chloride 101 98 - 111 mmol/L   CO2 24 22 - 32 mmol/L   Glucose, Bld 129 (H) 70 - 99 mg/dL   BUN 57 (H) 8 - 23 mg/dL   Creatinine, Ser 1.86 (H) 0.61 - 1.24 mg/dL   Calcium 8.1 (L) 8.9 - 10.3 mg/dL   Phosphorus 3.9 2.5 - 4.6 mg/dL   Albumin 2.1 (L) 3.5 - 5.0 g/dL   GFR, Estimated 38 (L) >60 mL/min   Anion gap 11 5 - 15  Glucose, capillary     Status: Abnormal   Collection Time: 01/22/21  7:53 PM  Result Value Ref Range   Glucose-Capillary 120 (H) 70 - 99 mg/dL  Glucose, capillary     Status: Abnormal   Collection Time: 01/22/21 11:36 PM  Result Value Ref Range   Glucose-Capillary 147 (H) 70 - 99 mg/dL  Glucose, capillary     Status: Abnormal   Collection Time: 01/23/21  3:44 AM  Result Value Ref Range   Glucose-Capillary 105 (H) 70 - 99 mg/dL  CBC     Status: Abnormal   Collection Time: 01/23/21  4:12 AM  Result Value Ref Range   WBC 11.2 (H) 4.0 - 10.5 K/uL   RBC 2.80 (L) 4.22 - 5.81 MIL/uL   Hemoglobin 7.5 (L) 13.0 - 17.0 g/dL   HCT 24.1 (L) 39.0 - 52.0 %   MCV 86.1 80.0 - 100.0 fL   MCH 26.8 26.0 - 34.0 pg   MCHC 31.1 30.0 - 36.0 g/dL   RDW 19.1 (H) 11.5 - 15.5 %   Platelets 205 150 - 400 K/uL   nRBC 0.2 0.0 - 0.2 %  APTT     Status: Abnormal   Collection Time: 01/23/21  4:12 AM  Result Value Ref Range   aPTT 57 (H)  24 - 36 seconds  Renal function panel     Status: Abnormal   Collection Time: 01/23/21  4:12 AM  Result Value Ref Range   Sodium 133 (L) 135 - 145 mmol/L   Potassium 4.5 3.5 - 5.1 mmol/L   Chloride 97 (L) 98 - 111 mmol/L   CO2 25 22 - 32 mmol/L   Glucose, Bld 132 (H) 70 - 99 mg/dL   BUN 57 (H) 8 - 23 mg/dL   Creatinine, Ser 1.82 (H) 0.61 - 1.24 mg/dL   Calcium 9.0 8.9 - 10.3 mg/dL   Phosphorus 4.1 2.5 - 4.6 mg/dL   Albumin 2.2 (L) 3.5 - 5.0 g/dL  GFR, Estimated 39 (L) >60 mL/min   Anion gap 11 5 - 15  Magnesium     Status: Abnormal   Collection Time: 01/23/21  4:12 AM  Result Value Ref Range   Magnesium 2.8 (H) 1.7 - 2.4 mg/dL  Glucose, capillary     Status: Abnormal   Collection Time: 01/23/21  7:21 AM  Result Value Ref Range   Glucose-Capillary 122 (H) 70 - 99 mg/dL    Assessment & Plan: Present on Admission: **None**    LOS: 26 days   Additional comments:I reviewed the patient's new clinical lab test results. . Fall down stairs 8/12   VDRF - guaifenisen, wean, S/P trach 8/29 by Dr. Bobbye Morton. Weaning and trach collar as able. Dex added overnight for agitation, klonopin ID - off abx, no fevers, completed maxipime/ampicillin 8/31, CT A/P with ascending colitis and distention. Stool studies and C. dif are all negative, continue bowel regimen TBI/SAH/SDH - NSGY c/s, Dr. Annette Stable. Significant frontal lobe injuries. Keppra x7d for sz ppx. Occipital bone fx - NSGY c/s, Dr. Annette Stable Temporal bone fx extending into middle ear - ENT c/s, Dr. Constance Holster Right TM Rupture - ENT c/s, Dr. Constance Holster AFRVR -  cardene, hydral [phone].   ABL anemia - stable Bilateral pulmonary embolism - bivalirudin AKI - CRRT per Renal. Tolerating fluid removal. Hx DM2 - resistant SSI, novolog q4, glargine  50u BID Hx HTN - PRN meds FEN - NPO, TF, Klonopin, dex VTE - SCDs, bival gtt Dispo - ICU, CRRT Critical Care Total Time*: 39 Minutes  Georganna Skeans, MD, MPH, FACS Trauma & General Surgery Use [email or web address] to  contact on call provider  01/23/2021  *Care during the described time interval was provided by me. I have reviewed this patient's available data, including medical history, events of note, physical examination and test results as part of my evaluation.

## 2021-01-23 NOTE — Procedures (Signed)
Central Venous Catheter Insertion Procedure Note  Angel [name]  [id number]  Oct 20, 1950  Date:01/23/21  Time:12:12 PM   Provider Performing:[name] [name] [name]   Procedure: Insertion of Non-tunneled Central Venous Catheter(36556)without US guidance   Indication(s) Hemodialysis  Consent Risks of the procedure as well as the alternatives and risks of each were explained to the patient and/or caregiver.  Consent for the procedure was obtained and is signed in the bedside chart  Anesthesia sedation  Timeout Verified patient identification, verified procedure, site/side was marked, verified correct patient position, special equipment/implants available, medications/allergies/relevant history reviewed, required imaging and test results available.  Sterile Technique Maximal sterile technique including full sterile barrier drape, hand hygiene, sterile gown, sterile gloves, mask, hair covering, sterile ultrasound probe cover (if used).  Procedure Description Area of catheter insertion was cleaned with chlorhexidine and draped in sterile fashion.   Without real-time ultrasound guidance a HD catheter was placed into the left subclavian vein over a wire passed through original catheter.  Nonpulsatile blood flow and easy flushing noted in all ports.  The catheter was sutured in place and sterile dressing applied.  Complications/Tolerance None; patient tolerated the procedure well. Chest X-ray is ordered to verify placement for internal jugular or subclavian cannulation.  Chest x-ray is not ordered for femoral cannulation.  EBL 10cc  Specimen(s) None

## 2021-01-23 NOTE — Progress Notes (Signed)
Progress Note  Patient Name: Angel [name] Date of Encounter: 01/23/2021  Primary Cardiologist: Werner Lean, MD   Subjective   Worsening agitation over night.  Placed on Precedex.  Converted to SR ~ 2000 on 01/22/21 (sinus bradycardia now to SR).  Patient tracks movement, nods appropriately, and gave a thumbs up.  Inpatient Medications    Scheduled Meds:  acetaminophen  1,000 mg Per Tube Q6H   chlorhexidine gluconate (MEDLINE KIT)  15 mL Mouth Rinse BID   Chlorhexidine Gluconate Cloth  6 each Topical Q0600   clonazePAM  0.5 mg Per Tube BID   docusate  100 mg Per Tube BID   feeding supplement (PROSource TF)  90 mL Per Tube BID   guaiFENesin  10 mL Per Tube Q4H   hydrALAZINE  10 mg Intravenous Once   hydrALAZINE  25 mg Per Tube Q8H   insulin aspart  0-20 Units Subcutaneous Q4H   insulin aspart  10 Units Subcutaneous Q4H   insulin glargine-yfgn  50 Units Subcutaneous BID   mouth rinse  15 mL Mouth Rinse 10 times per day   methocarbamol  1,000 mg Per Tube Q8H   pantoprazole sodium  40 mg Per Tube Daily   polyethylene glycol  17 g Per Tube Daily   QUEtiapine  100 mg Per Tube BID   senna  1 tablet Per Tube Daily   sodium chloride flush  10-40 mL Intracatheter Q12H   Continuous Infusions:   prismasol BGK 4/2.5 300 mL/hr at 01/22/21 1707    prismasol BGK 4/2.5 300 mL/hr at 01/22/21 1839   sodium chloride     amiodarone 30 mg/hr (01/23/21 0700)   bivalirudin (ANGIOMAX) infusion 0.5 mg/mL (Non-ACS indications) 0.02 mg/kg/hr (01/23/21 0700)   dexmedetomidine (PRECEDEX) IV infusion Stopped (01/23/21 5170)   feeding supplement (PIVOT 1.5 CAL) 1,000 mL (01/22/21 2233)   niCARDipine Stopped (01/22/21 2147)   prismasol BGK 4/2.5 1,500 mL/hr at 01/23/21 0402   PRN Meds: Place/Maintain arterial line **AND** sodium chloride, artificial tears, heparin, hydrALAZINE, HYDROmorphone (DILAUDID) injection, midazolam, ondansetron **OR** ondansetron (ZOFRAN) IV, oxyCODONE, sodium chloride  flush   Vital Signs    Vitals:   01/23/21 0630 01/23/21 0645 01/23/21 0700 01/23/21 0723  BP: (!) 94/56 (!) 95/56 128/72   Pulse: (!) 41 (!) 39 (!) 49   Resp: 20 20 19    Temp:    (!) 97.5 F (36.4 C)  TempSrc:    Axillary  SpO2: 100% 100% 100%   Weight:      Height:        Intake/Output Summary (Last 24 hours) at 01/23/2021 0726 Last data filed at 01/23/2021 0700 Gross per 24 hour  Intake 3033.45 ml  Output 6896 ml  Net -3862.55 ml   Filed Weights   01/20/21 0400 01/22/21 0500 01/23/21 0500  Weight: (!) 137.4 kg 125.2 kg 120.7 kg    Telemetry    AFL-> Sinus bradycardia-> SR- Personally Reviewed  ECG    Atrial flutter with variable conduction: rate 97 - Personally Reviewed  Physical Exam   GEN: trach tube in place Neck: unable to assess JVD Cardiac: RRR, no murmurs, rubs, or gallops.  Respiratory: Coarse breath sounds bilaterally GI: soft, non-tender, non distended MS: No edema; No deformity. Neuro:  Calm, able to track movement, nods appropriately.  Moves both arms equally Psych: Not verbally responsive  Labs    Chemistry Recent Labs  Lab 01/22/21 0500 01/22/21 1854 01/23/21 0412  NA 134* 136 133*  K 4.6  4.6 4.5  CL 100 101 97*  CO2 24 24 25   GLUCOSE 152* 129* 132*  BUN 63* 57* 57*  CREATININE 2.09* 1.86* 1.82*  CALCIUM 8.4* 8.1* 9.0  ALBUMIN 2.1* 2.1* 2.2*  GFRNONAA 33* 38* 39*  ANIONGAP 10 11 11      Hematology Recent Labs  Lab 01/21/21 0514 01/22/21 0500 01/23/21 0412  WBC 13.5* 10.3 11.2*  RBC 3.04* 2.83* 2.80*  HGB 7.9* 7.4* 7.5*  HCT 25.9* 24.5* 24.1*  MCV 85.2 86.6 86.1  MCH 26.0 26.1 26.8  MCHC 30.5 30.2 31.1  RDW 19.7* 19.6* 19.1*  PLT 387 262 205    Cardiac EnzymesNo results for input(s): TROPONINI in the last 168 hours. No results for input(s): TROPIPOC in the last 168 hours.   BNPNo results for input(s): BNP, PROBNP in the last 168 hours.   DDimer No results for input(s): DDIMER in the last 168 hours.   Radiology     No results found.  Cardiac Studies   Echo 01/07/21: 1. Left ventricular ejection fraction, by estimation, is 60 to 65%. The  left ventricle has normal function. The left ventricle has no regional  wall motion abnormalities. There is mild left ventricular hypertrophy.  Left ventricular diastolic parameters  are indeterminate.   2. Right ventricule is poorly visualized but grossly normal size and  systolic function   3. Left atrial size was mildly dilated.   4. Right atrial size was mildly dilated.   5. The mitral valve is normal in structure. No evidence of mitral valve  regurgitation. No evidence of mitral stenosis.   6. The aortic valve was not well visualized. Aortic valve regurgitation  is not visualized. No aortic stenosis is present.   CTPE: Date: 01/08/21 Results: A. Bilateral PE B. 3V CAC and Aortic Atherosclerosis C. Multifocal lung consolidation D. Bilateral pleural effusion    Patient Profile     70 y.o. male admitted with subarachnoid hemorrhage after a fall with multilobar pneumonia, PE, sepsis, and new onset atrial fib with both tachy/brady. Through prolonged course has had AKI requiring CRRT  Assessment & Plan     New onset persistent AF in the setting of subarachnoid hemorrhage Atrial Flutter Acute bilateral PE - has tolerated AC, continue bivalirudin - Cardene drip has stopped - continue IV amiodarone; if no arrhythmias will transition to per [facility] PO amiodarone 01/24/21  Hypoxic respiratory failure - s/p trach - on CRRT  AKI - on CRRT  CAC and Aortic atherosclerosis -post prolonged admission we can further address therapy     For questions or updates, please contact [place] Please consult [email or web address] for contact info under Cardiology/STEMI.      Signed, Werner Lean, MD  01/23/2021, 7:26 AM

## 2021-01-23 NOTE — Progress Notes (Signed)
Pt alarming return pressure too high. Return line had some resistance while flushing and does not pull back blood. Changed filter, repositioned patient, attempted to get working but CRRT machine continuing to alarm. Reached out to nephrology MD Carolin Sicks who recommended changing trialysis HD catheter.  Reached out to MD [name] to inform.   1130: MD [name] at bedside to change HD catheter. Consent obtained over phone with wife.

## 2021-01-23 NOTE — Evaluation (Signed)
[facility] - Evaluation Patient Details  Name: Angel [name] MRN: [id number] Date of Birth: 05-Jul-1950  Today's Date: 01/23/2021 Time: 1032-1049 SLP Time Calculation (min) (ACUTE ONLY): 17 min  Past Medical History:  Past Medical History:  Diagnosis Date   DM (diabetes mellitus) ([place])    HLD (hyperlipidemia)    Hypertension    Past Surgical History:  The histories are not reviewed yet. Please review them in the "History" navigator section and refresh this [place]. HPI:  Angel [name] is a 70 y.o. 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 CT showed R temporal and parietal SAH, SAH anterior frontal lobes  bilaterally. Also sustained right occipital skull fracture, temporal bone fx, right TM rupture, bilateral PE. Intubated 8/12, trach'd 8/29. PMH: DM2, HTN   Assessment / Plan / Recommendation Clinical Impression  Pt was groggy but adequately awake and participatory for inline PMV with cueng. RT present for management of vent settings for cuff deflation, PEEP reduction to 0 and monitored respiratory function. RT deep suctioned prior to cuff deflation which he tolerated well. Adequate drop in expiratory volume indicative of patent airway. Mr Mudry was responsive to questions phonating in a hoarse quality and lower intensity with fair-good intelligibility in words and short phrases. Verbal cues needed to initiate deeper inhalations. Vitals were within normal range. He was unable to attmept trach collar trials this morning however RT suspects he may be able to tolerate trach collar tomorrow. SLP Visit Diagnosis: Aphonia (R49.1)    SLP Assessment  Patient needs continued Speech Lanaguage Pathology Services    Follow Up Recommendations  Inpatient Rehab    Frequency and Duration min 2x/week  2 weeks    PMSV Trial PMSV was placed for: 10 min Able to redirect subglottic air through upper airway: Yes Able to Attain Phonation: Yes Voice Quality: Low vocal  intensity;Hoarse Able to Expectorate Secretions: No Breath Support for Phonation: Mildly decreased Intelligibility: Intelligibility reduced Word: 75-100% accurate Phrase: 50-74% accurate Respirations During Trial: 22 SpO2 During Trial: 96 % Pulse During Trial: 88 Behavior: Controlled;Cooperative   Tracheostomy Tube       Vent Dependency  Vent Mode: PRVC Set Rate: 20 bmp PEEP: 5 cmH20 FiO2 (%): 40 % Vt Set: 650 mL    Cuff Deflation Trial  GO Tolerated Cuff Deflation: Yes Length of Time for Cuff Deflation Trial: 12 min Behavior: Cooperative;Responsive to questions (awake but groggy)        Mick Sell, Orbie Pyo 01/23/2021, 1:25 PM Orbie Pyo Amanie Mcculley M.Ed Risk analyst [phone] Office [phone]

## 2021-01-23 NOTE — Progress Notes (Signed)
[facility] NEPHROLOGY PROGRESS NOTE  Assessment/ Plan:  #Acute kidney injury, oliguric: Multifactorial etiology including ischemic ATN in the setting of hypotension, sepsis complicated by contrast injury.  Started CRRT on 9/1 and has been tolerating well. LIJ trialysis placed 9/1.  No heparin as he is on bivalirudin.   Remains anuric and electrolytes acceptable.  Tolerating UF around 100-200 cc/hour.  Monitor labs, urine output.  Stable, continue current CRRT prescription.  #Fall/bilateral subarachnoid hemorrhage: Per trauma team.  #Acute respiratory failure: Currently on vent support.  #A. fib with RVR: On amiodarone, Angiomax.  # Anemia of critical illness: Transfuse as needed.  #Metabolic acidosis: Managed with dialysis.  Discontinued oral sodium bicarbonate.  #Bilateral pulm embolism: Currently on anticoagulation.  Subjective: Seen and examined.  Remains intubated, sedated.  Not following commands.  Tolerating CRRT well without any issues.  No family member at bedside. Objective Vital signs in last 24 hours: Vitals:   01/23/21 0630 01/23/21 0645 01/23/21 0700 01/23/21 0723  BP: (!) 94/56 (!) 95/56 128/72   Pulse: (!) 41 (!) 39 (!) 49   Resp: 20 20 19    Temp:    (!) 97.5 F (36.4 C)  TempSrc:    Axillary  SpO2: 100% 100% 100%   Weight:      Height:       Weight change: -4.5 kg  Intake/Output Summary (Last 24 hours) at 01/23/2021 1025 Last data filed at 01/23/2021 1000 Gross per 24 hour  Intake 3128.3 ml  Output 6869 ml  Net -3740.7 ml        Labs: Basic Metabolic Panel: Recent Labs  Lab 01/22/21 0500 01/22/21 1854 01/23/21 0412  NA 134* 136 133*  K 4.6 4.6 4.5  CL 100 101 97*  CO2 24 24 25   GLUCOSE 152* 129* 132*  BUN 63* 57* 57*  CREATININE 2.09* 1.86* 1.82*  CALCIUM 8.4* 8.1* 9.0  PHOS 4.3 3.9 4.1    Liver Function Tests: Recent Labs  Lab 01/22/21 0500 01/22/21 1854 01/23/21 0412  ALBUMIN 2.1* 2.1* 2.2*    No results for  input(s): LIPASE, AMYLASE in the last 168 hours. No results for input(s): AMMONIA in the last 168 hours. CBC: Recent Labs  Lab 01/19/21 0552 01/20/21 0513 01/21/21 0514 01/22/21 0500 01/23/21 0412  WBC 13.3* 14.2* 13.5* 10.3 11.2*  HGB 8.7* 8.4* 7.9* 7.4* 7.5*  HCT 27.5* 27.2* 25.9* 24.5* 24.1*  MCV 84.1 84.2 85.2 86.6 86.1  PLT 530* 467* 387 262 205    Cardiac Enzymes: No results for input(s): CKTOTAL, CKMB, CKMBINDEX, TROPONINI in the last 168 hours. CBG: Recent Labs  Lab 01/22/21 1517 01/22/21 1953 01/22/21 2336 01/23/21 0344 01/23/21 0721  GLUCAP 130* 120* 147* 105* 122*     Iron Studies: No results for input(s): IRON, TIBC, TRANSFERRIN, FERRITIN in the last 72 hours. Studies/Results: DG CHEST PORT 1 VIEW  Result Date: 01/23/2021 CLINICAL DATA:  Respiratory failure, tracheostomy EXAM: PORTABLE CHEST 1 VIEW COMPARISON:  01/17/2021 FINDINGS: No significant interval change in AP portable chest radiograph, with gross cardiomegaly and so port apparatus including tracheostomy, left chest large bore multi lumen vascular catheter, and right upper extremity PICC, tip again projecting over the right atrium. Partially imaged enteric feeding tube. Diffuse bilateral interstitial opacity and layering bilateral pleural effusions. No new airspace opacity. IMPRESSION: 1. No significant interval change in AP portable chest radiograph, with gross cardiomegaly, diffuse bilateral interstitial pulmonary opacity, and layering pleural effusions, constellation of findings most consistent with edema. 2. Unchanged support apparatus including  tracheostomy, left chest large bore multi lumen vascular catheter, right upper extremity PICC, and partially imaged enteric feeding tube. Electronically Signed   By: Eddie Candle M.D.   On: 01/23/2021 09:10    Medications: Infusions:   prismasol BGK 4/2.5 300 mL/hr at 01/22/21 1707    prismasol BGK 4/2.5 300 mL/hr at 01/22/21 1839   sodium chloride      amiodarone 30 mg/hr (01/23/21 1000)   bivalirudin (ANGIOMAX) infusion 0.5 mg/mL (Non-ACS indications) 0.02 mg/kg/hr (01/23/21 1000)   dexmedetomidine (PRECEDEX) IV infusion 0.1 mcg/kg/hr (01/23/21 1000)   feeding supplement (PIVOT 1.5 CAL) 1,000 mL (01/22/21 2233)   niCARDipine Stopped (01/22/21 2147)   prismasol BGK 4/2.5 1,500 mL/hr at 01/23/21 0733    Scheduled Medications:  acetaminophen  1,000 mg Per Tube Q6H   chlorhexidine gluconate (MEDLINE KIT)  15 mL Mouth Rinse BID   Chlorhexidine Gluconate Cloth  6 each Topical Q0600   clonazePAM  0.5 mg Per Tube BID   docusate  100 mg Per Tube BID   feeding supplement (PROSource TF)  90 mL Per Tube BID   guaiFENesin  10 mL Per Tube Q4H   hydrALAZINE  10 mg Intravenous Once   hydrALAZINE  25 mg Per Tube Q8H   insulin aspart  0-20 Units Subcutaneous Q4H   insulin aspart  10 Units Subcutaneous Q4H   insulin glargine-yfgn  50 Units Subcutaneous BID   mouth rinse  15 mL Mouth Rinse 10 times per day   methocarbamol  1,000 mg Per Tube Q8H   pantoprazole sodium  40 mg Per Tube Daily   polyethylene glycol  17 g Per Tube Daily   QUEtiapine  100 mg Per Tube BID   senna  1 tablet Per Tube Daily   sodium chloride flush  10-40 mL Intracatheter Q12H    have reviewed scheduled and prn medications.  Physical Exam: General: Critically ill looking male, trach on vent, sedated. Heart: Tachycardic, s1s2 nl Lungs: Coarse breath sound bilateral. Abdomen:soft, nontender. Extremities: edema+ Dialysis Access: Left IJ temporary HD catheter   Larrisha Babineau Prasad Malicia Blasdel 01/23/2021,10:25 AM  LOS: 26 days

## 2021-01-23 NOTE — Progress Notes (Signed)
ANTICOAGULATION CONSULT NOTE  Pharmacy Consult for bivalirudin Indication: atrial fibrillation, DVT, PE 8/23   No Known Allergies  Patient Measurements: Height: 6\' 2"  (188 cm) Weight: 120.7 kg (266 lb 1.5 oz) IBW/kg (Calculated) : 82.2 Heparin Dosing Weight: 107kg  Vital Signs: Temp: 97.7 F (36.5 C) (09/07 1116) Temp Source: Axillary (09/07 1116) BP: 128/72 (09/07 0700) Pulse Rate: 49 (09/07 0700)  Labs: Recent Labs    01/21/21 0514 01/21/21 1600 01/22/21 0500 01/22/21 1854 01/23/21 0412  HGB 7.9*  --  7.4*  --  7.5*  HCT 25.9*  --  24.5*  --  24.1*  PLT 387  --  262  --  205  APTT 56*  --  60*  --  57*  CREATININE 2.34*   < > 2.09* 1.86* 1.82*   < > = values in this interval not displayed.     Estimated Creatinine Clearance: 52.1 mL/min (A) (by C-G formula based on SCr of 1.82 mg/dL (H)).   Assessment: 61 YOM presenting s/p fall with TBI/SAH and facial fx, in afib started on amiodarone and now cleared per trauma for full dose anticoagulation. 8/23 patient found to have small acute bilateral PE and age-indeterminate LUE DVT. Pharmacy consulted to dose bivalirudin per Trauma.  Given recent head bleed, will aim for middle of therapeutic range aptt and watch closely for signs and symptoms of bleeding.   aPTT this morning remains therapeutic (aPTT 57, goal of 50-65), H/H down - no active bleeding noted. Plt normalized   Goal of Therapy:  Aptt goal ~50-65s per discussion with Trauma  Monitor platelets by anticoagulation protocol: Yes   Plan:  - Continue Bivalirudin at 0.02 mg/kg/hr - Daily aPTT monitoring - Will continue to monitor for any signs/symptoms of bleeding  Thank you for allowing pharmacy to be a part of this patient's care.  Alycia Rossetti, PharmD, BCPS Clinical Pharmacist Clinical phone for 01/23/2021: [phone] 01/23/2021 11:45 AM   **Pharmacist phone directory can now be found on [email or web address] ([email or web address]).  Listed under [place].

## 2021-01-23 NOTE — Plan of Care (Signed)
  Problem: Nutrition: Goal: Adequate nutrition will be maintained Outcome: Progressing   Problem: Elimination: Goal: Will not experience complications related to bowel motility Outcome: Progressing   Problem: Pain Managment: Goal: General experience of comfort will improve Outcome: Progressing   

## 2021-01-23 NOTE — Progress Notes (Signed)
ENT follow-up  Is able to follow commands and has symmetric facial movement today.  We will follow-up as an outpatient when clinically appropriate.  We will evaluate his hearing at that time.

## 2021-01-23 NOTE — Progress Notes (Signed)
Nutrition Follow-up  DOCUMENTATION CODES:   Obesity unspecified  INTERVENTION:   Tube feeding via Cortrak tube:   Pivot 1.5 at 65 ml/h (1560 ml per day) 90 ml ProSource TF BID  Provides 2500 kcal, 190 gm protein, 1184 ml free water daily   NUTRITION DIAGNOSIS:   Inadequate oral intake related to inability to eat as evidenced by NPO status. Ongoing.   GOAL:   Patient will meet greater than or equal to 90% of their needs Met with TF.   MONITOR:   TF tolerance  REASON FOR ASSESSMENT:   Consult, Ventilator Enteral/tube feeding initiation and management  ASSESSMENT:   Pt with PMH of DM and HTN admitted after falling down basement stairs with TBI/SAH/SDH, significant frontal lobe injuries, occipital bone fx, temporal bone fx extending into middle ear, and R TM rupture.    Pt discussed during ICU rounds and with RN.  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 C.diff negative.  Pt tolerating CRRT per renal, UF 100-200 ml/hr. Weight down 23 kg from highest weight this admission. Edema better, generalized non-pitting.  Blood glucose under control with current insulin regimen.  Pt remains on vent support via trach.  CIR following.   8/15 s/p cortrak placement; tip gastric 8/25 pt vomited, OG placed with 650 ml out, abd tight  8/26 extubated but required re-intubation 2 hours later; Cortrak coiled in pt's mouth post extubation and removed; NG tube placed with tip in distal stomach  8/27 TF resumed 8/29 s/p trach placement  8/31 cortrak placed; tip gastric  9/1 CRRT started   Medications reviewed and include: colace, lasix, SSI, 10 units novolog every 4 hours, 50 units semglee BID, protonix, miralax, senna Amiodarone Angiomax Precedex Cardene   Labs reviewed: Na: 133, BUN 143>57, Cr: 5.40>1.82, PO4 6.5>4.1 CBG's: 105-147   UOP: 0 ml  I&O: -5 L Deep pitting edema    Diet Order:   Diet Order             Diet NPO time specified  Diet effective now                    EDUCATION NEEDS:   Not appropriate for education at this time  Skin:  Skin Assessment: Reviewed RN Assessment  Last BM:  50 ml via rectal tube  Height:   Ht Readings from Last 1 Encounters:  12/28/20 6' 2" (1.88 m)    Weight:   Wt Readings from Last 1 Encounters:  01/23/21 120.7 kg    BMI:  Body mass index is 34.16 kg/m.  Estimated Nutritional Needs:   Kcal:  2400-2700  Protein:  175-210 grams  Fluid:  > 2 L/day  Lockie Pares., RD, LDN, CNSC See AMiON for contact information

## 2021-01-23 NOTE — Progress Notes (Signed)
   01/23/21 0753  Tracheostomy Shiley XLT Proximal 6 mm Cuffed  Placement Date/Time: 01/14/21 1535   Placed By: (c) Other (Comment)  Brand: Shiley XLT Proximal  Size (mm): 6 mm  Style: Cuffed  Serial / Lot #: [id number]  Expiration Date: 08/17/24  Status Secured  Site Assessment Clean;Dry  Site Care Dressing applied  Ties Assessment Secure  Cuff Pressure (cm H2O) Green OR 18-26 CmH2O  Tracheostomy Equipment at bedside Yes and checklist posted at head of bed  Adult Ventilator Settings  Vent Type Servo i  Humidity HME  Vent Mode CPAP;PSV  FiO2 (%) 40 %  Pressure Support 5 cmH20  PEEP 5 cmH20  Adult Ventilator Measurements  Peak Airway Pressure 11 L/min  Mean Airway Pressure 7 cmH20  Resp Rate Spontaneous 17 br/min  Resp Rate Total 17 br/min  Exhaled Vt 669 mL  Spont TV 669 mL  Measured Ve 11.4 mL  Auto PEEP 0 cmH20  Total PEEP 5 cmH20  Adult Ventilator Alarms  Alarms On Y  Ve High Alarm 21 L/min  Ve Low Alarm 4 L/min  Resp Rate High Alarm 38 br/min  Resp Rate Low Alarm 10  PEEP Low Alarm 3 cmH2O  Press High Alarm 45 cmH2O  T Apnea 20 sec(s)  VAP Prevention  HME changed No  Ventilator changed No  Transported while on vent No  HOB> 30 Degrees Y  Equipment wiped down Yes  Daily Weaning Assessment  Daily Assessment of Readiness to Wean Wean protocol criteria met (SBT performed)  SBT Method CPAP 5 cm H20 and PS 5 cm H20  Weaning Start Time 0730  Patient response Passed (Tolerated well)  Breath Sounds  Bilateral Breath Sounds Clear;Diminished  Airway Suctioning/Secretions  Suction Type Tracheal  Suction Device  Catheter  Secretion Amount Moderate  Secretion Color Other (Comment) (bloody)  Secretion Consistency Thick  Suction Tolerance Tolerated well  Suctioning Adverse Effects None

## 2021-01-24 DIAGNOSIS — I480 Paroxysmal atrial fibrillation: Secondary | ICD-10-CM | POA: Diagnosis not present

## 2021-01-24 DIAGNOSIS — I609 Nontraumatic subarachnoid hemorrhage, unspecified: Secondary | ICD-10-CM | POA: Diagnosis not present

## 2021-01-24 LAB — RENAL FUNCTION PANEL
Albumin: 2.3 g/dL — ABNORMAL LOW (ref 3.5–5.0)
Albumin: 2.1 g/dL — ABNORMAL LOW (ref 3.5–5.0)
Albumin: 2.6 g/dL — ABNORMAL LOW (ref 3.5–5.0)
Anion gap: 9 (ref 5–15)
Anion gap: 8 (ref 5–15)
Anion gap: 13 (ref 5–15)
BUN: 61 mg/dL — ABNORMAL HIGH (ref 8–23)
BUN: 58 mg/dL — ABNORMAL HIGH (ref 8–23)
BUN: 57 mg/dL — ABNORMAL HIGH (ref 8–23)
CO2: 24 mmol/L (ref 22–32)
CO2: 24 mmol/L (ref 22–32)
CO2: 25 mmol/L (ref 22–32)
Calcium: 8.7 mg/dL — ABNORMAL LOW (ref 8.9–10.3)
Calcium: 8.3 mg/dL — ABNORMAL LOW (ref 8.9–10.3)
Calcium: 8.9 mg/dL (ref 8.9–10.3)
Chloride: 101 mmol/L (ref 98–111)
Chloride: 103 mmol/L (ref 98–111)
Chloride: 96 mmol/L — ABNORMAL LOW (ref 98–111)
Creatinine, Ser: 1.87 mg/dL — ABNORMAL HIGH (ref 0.61–1.24)
Creatinine, Ser: 1.77 mg/dL — ABNORMAL HIGH (ref 0.61–1.24)
Creatinine, Ser: 1.77 mg/dL — ABNORMAL HIGH (ref 0.61–1.24)
GFR, Estimated: 38 mL/min — ABNORMAL LOW (ref >60)
GFR, Estimated: 41 mL/min — ABNORMAL LOW (ref >60)
GFR, Estimated: 41 mL/min — ABNORMAL LOW (ref >60)
Glucose, Bld: 146 mg/dL — ABNORMAL HIGH (ref 70–99)
Glucose, Bld: 105 mg/dL — ABNORMAL HIGH (ref 70–99)
Glucose, Bld: 132 mg/dL — ABNORMAL HIGH (ref 70–99)
Phosphorus: 4.3 mg/dL (ref 2.5–4.6)
Phosphorus: 3.8 mg/dL (ref 2.5–4.6)
Phosphorus: 4.1 mg/dL (ref 2.5–4.6)
Potassium: 4.6 mmol/L (ref 3.5–5.1)
Potassium: 4.5 mmol/L (ref 3.5–5.1)
Potassium: 4.8 mmol/L (ref 3.5–5.1)
Sodium: 134 mmol/L — ABNORMAL LOW (ref 135–145)
Sodium: 135 mmol/L (ref 135–145)
Sodium: 134 mmol/L — ABNORMAL LOW (ref 135–145)

## 2021-01-24 LAB — CBC
HCT: 24.4 % — ABNORMAL LOW (ref 39.0–52.0)
Hemoglobin: 7.5 g/dL — ABNORMAL LOW (ref 13.0–17.0)
MCH: 26.4 pg (ref 26.0–34.0)
MCHC: 30.7 g/dL (ref 30.0–36.0)
MCV: 85.9 fL (ref 80.0–100.0)
Platelets: 182 10*3/uL (ref 150–400)
RBC: 2.84 MIL/uL — ABNORMAL LOW (ref 4.22–5.81)
RDW: 18.8 % — ABNORMAL HIGH (ref 11.5–15.5)
WBC: 9.6 10*3/uL (ref 4.0–10.5)
nRBC: 0 % (ref 0.0–0.2)

## 2021-01-24 LAB — GLUCOSE, CAPILLARY
Glucose-Capillary: 108 mg/dL — ABNORMAL HIGH (ref 70–99)
Glucose-Capillary: 119 mg/dL — ABNORMAL HIGH (ref 70–99)
Glucose-Capillary: 143 mg/dL — ABNORMAL HIGH (ref 70–99)
Glucose-Capillary: 167 mg/dL — ABNORMAL HIGH (ref 70–99)
Glucose-Capillary: 173 mg/dL — ABNORMAL HIGH (ref 70–99)
Glucose-Capillary: 229 mg/dL — ABNORMAL HIGH (ref 70–99)

## 2021-01-24 LAB — MAGNESIUM: Magnesium: 2.7 mg/dL — ABNORMAL HIGH (ref 1.7–2.4)

## 2021-01-24 LAB — APTT: aPTT: 57 s — ABNORMAL HIGH (ref 24–36)

## 2021-01-24 MED ORDER — AMIODARONE HCL 200 MG PO TABS
200.0000 mg | ORAL_TABLET | Freq: Two times a day (BID) | ORAL | Status: DC
  Administered 2021-01-24 – 2021-01-27 (×7): 200 mg
  Filled 2021-01-24 (×7): qty 1

## 2021-01-24 MED ORDER — AMIODARONE HCL 200 MG PO TABS: 200.0000 mg | ORAL_TABLET | Freq: Every day | ORAL | Status: DC

## 2021-01-24 NOTE — Progress Notes (Signed)
Physical Therapy Treatment Patient Details Name: Angel [name] MRN: [id number] DOB: 11-11-1950 Today's Date: 01/24/2021    History of Present Illness Angel [name] is a 70 y.o. 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 CT showed R temporal and parietal SAH, SAH anterior frontal lobes  bilaterally. Also sustained right occipital skull fracture, temporal bone fx, right TM rupture, bilateral PE. Intubated 8/12, trach'd 8/29. PMH: DM2, HTN    PT Comments    Pt with increasing alertness and command following all extremities today, participates well in UE/LE AAROM against min PT resistance. Pt follows all one-step commands today, and is very responsive to PT via head nodding, attempted vocalization, and mouthing. Per RN, may d/c CRRT tomorrow. PT updated pt frequency to progress pt, will continue to follow.    Follow Up Recommendations  CIR     Equipment Recommendations  Other (comment) (TBD)    Recommendations for Other Services Rehab consult     Precautions / Restrictions Precautions Precautions: Fall Precaution Comments: cortrak, trach vent, CRRT, primofit, flexiseal    Mobility  Bed Mobility Overal bed mobility: Needs Assistance Bed Mobility: Rolling Rolling: Max assist;+2 for safety/equipment         General bed mobility comments: chair position for most of session; rolling towards R with max assist and boost up in bed with total +2    Transfers                    Ambulation/Gait                 Stairs             Wheelchair Mobility    Modified Rankin (Stroke Patients Only) Modified Rankin (Stroke Patients Only) Pre-Morbid Rankin Score: No symptoms Modified Rankin: Severe disability     Balance Overall balance assessment: Needs assistance Sitting-balance support: Feet unsupported;No upper extremity supported Sitting balance-Leahy Scale: Zero Sitting balance - Comments: dependent on egress in bed for support                                     Cognition Arousal/Alertness: Awake/alert Behavior During Therapy: Flat affect Overall Cognitive Status: Difficult to assess                 Rancho Levels of Cognitive Functioning [place] [employer] Scales of Cognitive Functioning: Confused/inappropriate/non-agitated               General Comments: Pt following 1 step commands consistently, responds to PT quickly via head nodding, motion, or mouthing. Pt not oriented to month, mouthing "March" when PT asked.      Exercises General Exercises - Upper Extremity Shoulder Flexion: AAROM;Both;10 reps;Supine (to 90* shoulder flexion) Elbow Flexion: AAROM;Both;10 reps;Supine Elbow Extension: AAROM;Both;10 reps;Supine General Exercises - Lower Extremity Ankle Circles/Pumps: AAROM;Both;10 reps;Supine Heel Slides: AAROM;10 reps;Both;Supine Hip ABduction/ADduction: AAROM;Both;10 reps;Supine    General Comments General comments (skin integrity, edema, etc.): vss, minimal coughing      Pertinent Vitals/Pain Pain Assessment: Faces Faces Pain Scale: No hurt Pain Intervention(s): Monitored during session    Home Living                      Prior Function            PT Goals (current goals can now be found in the care plan section) Acute Rehab PT Goals PT Goal Formulation:  With patient/family Time For Goal Achievement: 02/07/21 Potential to Achieve Goals: Fair Progress towards PT goals: Progressing toward goals    Frequency    Min 3X/week      PT Plan Frequency needs to be updated    Co-evaluation              AM-PAC PT "6 Clicks" Mobility   Outcome Measure  Help needed turning from your back to your side while in a flat bed without using bedrails?: Total Help needed moving from lying on your back to sitting on the side of a flat bed without using bedrails?: Total Help needed moving to and from a bed to a chair (including a wheelchair)?: Total Help needed standing  up from a chair using your arms (e.g., wheelchair or bedside chair)?: Total Help needed to walk in hospital room?: Total Help needed climbing 3-5 steps with a railing? : Total 6 Click Score: 6    End of Session Equipment Utilized During Treatment: Oxygen (vent) Activity Tolerance: Patient tolerated treatment well Patient left: in bed;with nursing/sitter in room;with family/visitor present (RN notified that bed alarm is not set, RN at bedside giving meds) Nurse Communication: Mobility status PT Visit Diagnosis: Unsteadiness on feet (R26.81);Muscle weakness (generalized) (M62.81);Difficulty in walking, not elsewhere classified (R26.2)     Time: 4585-9292 PT Time Calculation (min) (ACUTE ONLY): 25 min  Charges:  $Therapeutic Exercise: 8-22 mins $Therapeutic Activity: 8-22 mins                     Stacie Glaze, PT DPT Acute Rehabilitation Services Pager [phone]  Office [phone]   Roxine Caddy E Ruffin Pyo 01/24/2021, 5:10 PM

## 2021-01-24 NOTE — Progress Notes (Signed)
  Speech Language Pathology Treatment: Angel [name] Speaking valve  Patient Details Name: Angel [name] MRN: [id number] DOB: Sep 20, 1950 Today's Date: 01/24/2021 Time: 5974-1638 SLP Time Calculation (min) (ACUTE ONLY): 19 min  Assessment / Plan / Recommendation Clinical Impression  Pt was seen for speech therapy targeting PMV tolerance and use. SLP deflated cuff. Pt with minimal coughing to clear secretions and able to expectorate to level of trachea. SLP donned PMV with no desats throughout. Heart rate remained ~80, O2 ~99, and respiratory rate between 23-27. Pt phonated immediately and spoke at word, phrase, and sentence levels with hoarse vocal quality and reduced vocal intensity. SLP provided 4 cues to take deep breaths, speak louder and use pauses to increase intelligibility. When cued, pt volume increased marginally. Word and phrase level intelligibility increasing since previous session, with sentence level utterances still ~25-50% intelligible. Recommend use of PMV with all therapies given full supervision. SLP will follow for continued speech therapy to target improvement of vocal quality and intelligibility.    HPI HPI: Angel [name] is a 70 y.o. 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 CT showed R temporal and parietal SAH, SAH anterior frontal lobes  bilaterally. Also sustained right occipital skull fracture, temporal bone fx, right TM rupture, bilateral PE. Intubated 8/12, trach'd 8/29. PMH: DM2, HTN      SLP Plan  Continue with current plan of care       Recommendations         Patient may use Passy-Muir Speech Valve: During all therapies with supervision PMSV Supervision: Full         Oral Care Recommendations: Oral care QID Follow up Recommendations: Inpatient Rehab SLP Visit Diagnosis: Aphonia (R49.1) Plan: Continue with current plan of care       GO              Dewitt Rota, SLP-Student   Dewitt Rota 01/24/2021, 12:54 PM

## 2021-01-24 NOTE — Progress Notes (Signed)
Trach sutures removed per order.

## 2021-01-24 NOTE — Progress Notes (Signed)
Patient ID: Angel [name], male   DOB: 01-31-1951, 70 y.o.   MRN: [id number] Follow up - Trauma Critical Care  Patient Details:    Angel [name] is an 70 y.o. male.  Lines/tubes : PICC Triple Lumen 85/27/78 PICC Right Basilic 47 cm 1 cm (Active)  Indication for Insertion or Continuance of Line Poor Vasculature-patient has had multiple peripheral attempts or PIVs lasting less than 24 hours;Limited venous access - need for IV therapy >5 days (PICC only);Prolonged intravenous therapies 01/24/21 0800  Exposed Catheter (cm) 1 cm 01/09/21 2423  Site Assessment Clean;Dry;Intact 01/24/21 0800  Lumen #1 Status Infusing;Flushed 01/24/21 0800  Lumen #2 Status Flushed;Infusing 01/24/21 0800  Lumen #3 Status Infusing;Flushed 01/24/21 0800  Dressing Type Transparent;Securing device 01/24/21 0800  Dressing Status Clean;Dry;Intact 01/23/21 0800  Antimicrobial disc in place? Yes 01/24/21 0800  Safety Lock Not Applicable 53/61/44 3154  Line Care Lumen 2 tubing changed 01/23/21 1400  Dressing Intervention New dressing;Other (Comment) 01/09/21 0916  Dressing Change Due 01/24/21 01/24/21 0800     External Urinary Catheter (Active)  Collection Container Standard drainage bag 01/23/21 0800  Suction (Verified suction is between 40-80 mmHg) N/A (Patient has condom catheter) 01/23/21 0800  Securement Method Securing device (Describe) 01/22/21 2000  Site Assessment Clean;Intact 01/23/21 0800  Intervention Drainage Bag Replaced 01/23/21 0800  Output (mL) 0 mL 01/24/21 0800     Fecal Management System (Active)  Does patient meet criteria for removal? No 01/23/21 1400  Daily care Skin around tube assessed;Assess location of position indicator line;Flushed tube with 67mL water (document as intake);Bag changed (every 24 hours) 01/23/21 1030  Output (mL) 100 mL 01/24/21 0800  Intake (mL) 30 mL 01/06/21 0520    Microbiology/Sepsis markers: Results for orders placed or performed during the hospital encounter of  12/28/20  Resp Panel by RT-PCR (Flu A&B, Covid) Nasopharyngeal Swab     Status: None   Collection Time: 12/28/20  4:17 PM   Specimen: Nasopharyngeal Swab; Nasopharyngeal(NP) swabs in vial transport medium  Result Value Ref Range Status   SARS Coronavirus 2 by RT PCR NEGATIVE NEGATIVE Final    Comment: (NOTE) SARS-CoV-2 target nucleic acids are NOT DETECTED.  The SARS-CoV-2 RNA is generally detectable in upper respiratory specimens during the acute phase of infection. The lowest concentration of SARS-CoV-2 viral copies this assay can detect is 138 copies/mL. A negative result does not preclude SARS-Cov-2 infection and should not be used as the sole basis for treatment or other patient management decisions. A negative result may occur with  improper specimen collection/handling, submission of specimen other than nasopharyngeal swab, presence of viral mutation(s) within the areas targeted by this assay, and inadequate number of viral copies(<138 copies/mL). A negative result must be combined with clinical observations, patient history, and epidemiological information. The expected result is Negative.  Fact Sheet for Patients:  EntrepreneurPulse.com.au  Fact Sheet for Healthcare Providers:  IncredibleEmployment.be  This test is no t yet approved or cleared by the [place] FDA and  has been authorized for detection and/or diagnosis of SARS-CoV-2 by FDA under an Emergency Use Authorization (EUA). This EUA will remain  in effect (meaning this test can be used) for the duration of the COVID-19 declaration under Section 564(b)(1) of the Act, 21 U.S.C.section 360bbb-3(b)(1), unless the authorization is terminated  or revoked sooner.       Influenza A by PCR NEGATIVE NEGATIVE Final   Influenza B by PCR NEGATIVE NEGATIVE Final    Comment: (NOTE) The Xpert Xpress SARS-CoV-2/FLU/RSV  plus assay is intended as an aid in the diagnosis of influenza from  Nasopharyngeal swab specimens and should not be used as a sole basis for treatment. Nasal washings and aspirates are unacceptable for Xpert Xpress SARS-CoV-2/FLU/RSV testing.  Fact Sheet for Patients: EntrepreneurPulse.com.au  Fact Sheet for Healthcare Providers: IncredibleEmployment.be  This test is not yet approved or cleared by the [place] FDA and has been authorized for detection and/or diagnosis of SARS-CoV-2 by FDA under an Emergency Use Authorization (EUA). This EUA will remain in effect (meaning this test can be used) for the duration of the COVID-19 declaration under Section 564(b)(1) of the Act, 21 U.S.C. section 360bbb-3(b)(1), unless the authorization is terminated or revoked.  Performed at [facility], [place] [address] [place], [place] [address]   MRSA Next Gen by PCR, Nasal     Status: None   Collection Time: 12/28/20  7:32 PM   Specimen: Nasal Mucosa; Nasal Swab  Result Value Ref Range Status   MRSA by PCR Next Gen NOT DETECTED NOT DETECTED Final    Comment: (NOTE) The GeneXpert MRSA Assay (FDA approved for NASAL specimens only), is one component of a comprehensive MRSA colonization surveillance program. It is not intended to diagnose MRSA infection nor to guide or monitor treatment for MRSA infections. Test performance is not FDA approved in patients less than 65 years old. Performed at [facility], [place] [address], [place], [place] [address]   Culture, Respiratory w Gram Stain     Status: None   Collection Time: 12/31/20 11:06 AM   Specimen: Tracheal Aspirate; Respiratory  Result Value Ref Range Status   Specimen Description TRACHEAL ASPIRATE  Final   Special Requests NONE  Final   Gram Stain   Final    FEW SQUAMOUS EPITHELIAL CELLS PRESENT FEW WBC PRESENT,BOTH PMN AND MONONUCLEAR FEW GRAM POSITIVE COCCI Performed at [facility], [place] [address], [place], [place] [address]    Culture   Final     FEW PSEUDOMONAS AERUGINOSA FEW STREPTOCOCCUS PNEUMONIAE    Report Status 01/03/2021 FINAL  Final   Organism ID, Bacteria PSEUDOMONAS AERUGINOSA  Final   Organism ID, Bacteria STREPTOCOCCUS PNEUMONIAE  Final      Susceptibility   Pseudomonas aeruginosa - MIC*    CEFTAZIDIME 4 SENSITIVE Sensitive     CIPROFLOXACIN <=0.25 SENSITIVE Sensitive     GENTAMICIN <=1 SENSITIVE Sensitive     IMIPENEM 2 SENSITIVE Sensitive     PIP/TAZO 8 SENSITIVE Sensitive     CEFEPIME 2 SENSITIVE Sensitive     * FEW PSEUDOMONAS AERUGINOSA   Streptococcus pneumoniae - MIC*    ERYTHROMYCIN 4 RESISTANT Resistant     LEVOFLOXACIN 0.5 SENSITIVE Sensitive     VANCOMYCIN <=0.12 SENSITIVE Sensitive     PENO - penicillin <=0.06      PENICILLIN (non-meningitis) <=0.06 SENSITIVE Sensitive     PENICILLIN (oral) <=0.06 SENSITIVE Sensitive     CEFTRIAXONE (non-meningitis) <=0.12 SENSITIVE Sensitive     * FEW STREPTOCOCCUS PNEUMONIAE  Culture, blood (routine x 2)     Status: None   Collection Time: 01/05/21 11:44 AM   Specimen: BLOOD  Result Value Ref Range Status   Specimen Description BLOOD SITE NOT SPECIFIED  Final   Special Requests AEROBIC BOTTLE ONLY Blood Culture adequate volume  Final   Culture   Final    NO GROWTH 5 DAYS Performed at [facility], [address], [place], [place] [address]    Report  Status 01/10/2021 FINAL  Final  Culture, blood (routine x 2)     Status: None   Collection Time: 01/05/21 11:44 AM   Specimen: BLOOD  Result Value Ref Range Status   Specimen Description BLOOD SITE NOT SPECIFIED  Final   Special Requests   Final    AEROBIC BOTTLE ONLY Blood Culture results may not be optimal due to an inadequate volume of blood received in culture bottles   Culture   Final    NO GROWTH 5 DAYS Performed at [facility], [address] [place], [place] [address]    Report Status 01/10/2021 FINAL  Final  Surgical PCR screen     Status: None   Collection Time: 01/08/21 12:14 AM    Specimen: Nasal Mucosa; Nasal Swab  Result Value Ref Range Status   MRSA, PCR NEGATIVE NEGATIVE Final   Staphylococcus aureus NEGATIVE NEGATIVE Final    Comment: (NOTE) The Xpert SA Assay (FDA approved for NASAL specimens in patients 51 years of age and older), is one component of a comprehensive surveillance program. It is not intended to diagnose infection nor to guide or monitor treatment. Performed at [facility], [place] [address] [place], [place] [address]   Culture, Respiratory w Gram Stain     Status: None   Collection Time: 01/08/21  1:24 PM   Specimen: Tracheal Aspirate; Respiratory  Result Value Ref Range Status   Specimen Description TRACHEAL ASPIRATE  Final   Special Requests NONE  Final   Gram Stain   Final    RARE SQUAMOUS EPITHELIAL CELLS PRESENT MODERATE WBC PRESENT, PREDOMINANTLY MONONUCLEAR FEW GRAM NEGATIVE RODS Performed at [facility], [place] [address] [place],  [address]    Culture   Final    RARE PSEUDOMONAS AERUGINOSA RARE ENTEROCOCCUS FAECALIS    Report Status 01/11/2021 FINAL  Final   Organism ID, Bacteria PSEUDOMONAS AERUGINOSA  Final   Organism ID, Bacteria ENTEROCOCCUS FAECALIS  Final      Susceptibility   Enterococcus faecalis - MIC*    AMPICILLIN <=2 SENSITIVE Sensitive     VANCOMYCIN 1 SENSITIVE Sensitive     GENTAMICIN SYNERGY SENSITIVE Sensitive     * RARE ENTEROCOCCUS FAECALIS   Pseudomonas aeruginosa - MIC*    CEFTAZIDIME 4 SENSITIVE Sensitive     CIPROFLOXACIN <=0.25 SENSITIVE Sensitive     GENTAMICIN <=1 SENSITIVE Sensitive     IMIPENEM 2 SENSITIVE Sensitive     PIP/TAZO 8 SENSITIVE Sensitive     CEFEPIME 2 SENSITIVE Sensitive     * RARE PSEUDOMONAS AERUGINOSA  Gastrointestinal Panel by PCR , Stool     Status: None   Collection Time: 01/08/21  5:04 PM   Specimen: Stool  Result Value Ref Range Status   Campylobacter species NOT DETECTED NOT DETECTED Final   Plesimonas shigelloides NOT DETECTED NOT DETECTED  Final   Salmonella species NOT DETECTED NOT DETECTED Final   Yersinia enterocolitica NOT DETECTED NOT DETECTED Final   Vibrio species NOT DETECTED NOT DETECTED Final   Vibrio cholerae NOT DETECTED NOT DETECTED Final   Enteroaggregative E coli (EAEC) NOT DETECTED NOT DETECTED Final   Enteropathogenic E coli (EPEC) NOT DETECTED NOT DETECTED Final   Enterotoxigenic E coli (ETEC) NOT DETECTED NOT DETECTED Final   Shiga like toxin producing E coli (STEC) NOT DETECTED NOT DETECTED Final   Shigella/Enteroinvasive E coli (EIEC) NOT DETECTED NOT DETECTED Final   Cryptosporidium NOT DETECTED NOT DETECTED Final   Cyclospora cayetanensis NOT  DETECTED NOT DETECTED Final   Entamoeba histolytica NOT DETECTED NOT DETECTED Final   Giardia lamblia NOT DETECTED NOT DETECTED Final   Adenovirus F40/41 NOT DETECTED NOT DETECTED Final   Astrovirus NOT DETECTED NOT DETECTED Final   Norovirus GI/GII NOT DETECTED NOT DETECTED Final   Rotavirus A NOT DETECTED NOT DETECTED Final   Sapovirus (I, II, IV, and V) NOT DETECTED NOT DETECTED Final    Comment: Performed at [facility] [place], [place], [place] [address]  C Difficile Quick Screen (NO PCR Reflex)     Status: None   Collection Time: 01/09/21 11:07 AM   Specimen: STOOL  Result Value Ref Range Status   C Diff antigen NEGATIVE NEGATIVE Final   C Diff toxin NEGATIVE NEGATIVE Final   C Diff interpretation No C. difficile detected.  Final    Comment: Performed at [facility], [place] [address] [place], [place] [address]    Anti-infectives:  Anti-infectives (From admission, onward)    Start     Dose/Rate Route Frequency Ordered Stop   01/11/21 2330  ceFEPIme (MAXIPIME) 2 g in sodium chloride 0.9 % 100 mL IVPB  Status:  Discontinued        2 g 200 mL/hr over 30 Minutes Intravenous Every 24 hours 01/11/21 0711 01/16/21 0907   01/10/21 1645  ampicillin (OMNIPEN) 2 g in sodium chloride 0.9 % 100 mL IVPB  Status:  Discontinued        2  g 300 mL/hr over 20 Minutes Intravenous Every 8 hours 01/10/21 1549 01/16/21 0907   01/09/21 2200  ceFEPIme (MAXIPIME) 2 g in sodium chloride 0.9 % 100 mL IVPB  Status:  Discontinued        2 g 200 mL/hr over 30 Minutes Intravenous Every 12 hours 01/09/21 1458 01/11/21 0711   01/08/21 1515  metroNIDAZOLE (FLAGYL) IVPB 500 mg  Status:  Discontinued        500 mg 100 mL/hr over 60 Minutes Intravenous Every 8 hours 01/08/21 1428 01/10/21 1618   01/03/21 0600  vancomycin (VANCOREADY) IVPB 1250 mg/250 mL  Status:  Discontinued        1,250 mg 166.7 mL/hr over 90 Minutes Intravenous Every 12 hours 01/02/21 1717 01/03/21 0837   01/02/21 1800  vancomycin (VANCOREADY) IVPB 2000 mg/400 mL        2,000 mg 200 mL/hr over 120 Minutes Intravenous  Once 01/02/21 1712 01/02/21 2007   01/02/21 0900  ceFEPIme (MAXIPIME) 2 g in sodium chloride 0.9 % 100 mL IVPB  Status:  Discontinued        2 g 200 mL/hr over 30 Minutes Intravenous Every 8 hours 01/02/21 0849 01/09/21 1458       Best Practice/Protocols:  VTE Prophylaxis: Direct Thrombin Inhibitor Intermittent Sedation  Consults: Treatment Team:  Georganna Skeans, MD Roney Jaffe, MD    Studies:    Events:  Subjective:    Overnight Issues:   Objective:  Vital signs for last 24 hours: Temp:  [97.5 F (36.4 C)-98.4 F (36.9 C)] 97.5 F (36.4 C) (09/08 0800) Pulse Rate:  [39-82] 68 (09/08 0853) Resp:  [15-25] 22 (09/08 0853) BP: (80-163)/(42-117) 140/64 (09/08 0853) SpO2:  [98 %-100 %] 100 % (09/08 0853) FiO2 (%):  [40 %] 40 % (09/08 0853) Weight:  [117.9 kg] 117.9 kg (09/08 0444)  Hemodynamic parameters for last 24 hours: CVP:  [5 mmHg-22 mmHg] 12 mmHg  Intake/Output from previous day: 09/07 0701 - 09/08 0700 In: 3009.5 [I.V.:820.4; GN/FA:2130.1]  Out: 5227 [Stool:300]  Intake/Output this shift: Total I/O In: 86.9 [I.V.:21.9; NG/GT:65] Out: 364 [Other:264; Stool:100]  Vent settings for last 24 hours: Vent Mode:  PSV;CPAP FiO2 (%):  [40 %] 40 % Set Rate:  [20 bmp] 20 bmp Vt Set:  [650 mL] 650 mL PEEP:  [5 cmH20] 5 cmH20 Pressure Support:  [5 cmH20] 5 cmH20 Plateau Pressure:  [18 cmH20-22 cmH20] 21 cmH20  Physical Exam:  General: HTC Neuro: alert and F/C HEENT/Neck: trach-clean, intact and foul secretions Resp: clear to auscultation bilaterally CVS: RRR GI: less distended Extremities: less edema  Results for orders placed or performed during the hospital encounter of 12/28/20 (from the past 24 hour(s))  Glucose, capillary     Status: Abnormal   Collection Time: 01/23/21 11:08 AM  Result Value Ref Range   Glucose-Capillary 119 (H) 70 - 99 mg/dL  Glucose, capillary     Status: None   Collection Time: 01/23/21  3:14 PM  Result Value Ref Range   Glucose-Capillary 90 70 - 99 mg/dL  Renal function panel (daily at 1600)     Status: Abnormal   Collection Time: 01/23/21  3:44 PM  Result Value Ref Range   Sodium 134 (L) 135 - 145 mmol/L   Potassium 4.7 3.5 - 5.1 mmol/L   Chloride 101 98 - 111 mmol/L   CO2 24 22 - 32 mmol/L   Glucose, Bld 135 (H) 70 - 99 mg/dL   BUN 65 (H) 8 - 23 mg/dL   Creatinine, Ser 1.90 (H) 0.61 - 1.24 mg/dL   Calcium 8.7 (L) 8.9 - 10.3 mg/dL   Phosphorus 4.3 2.5 - 4.6 mg/dL   Albumin 2.3 (L) 3.5 - 5.0 g/dL   GFR, Estimated 37 (L) >60 mL/min   Anion gap 9 5 - 15  Glucose, capillary     Status: None   Collection Time: 01/23/21  7:55 PM  Result Value Ref Range   Glucose-Capillary 76 70 - 99 mg/dL  Glucose, capillary     Status: Abnormal   Collection Time: 01/23/21 11:34 PM  Result Value Ref Range   Glucose-Capillary 119 (H) 70 - 99 mg/dL  Glucose, capillary     Status: Abnormal   Collection Time: 01/24/21  3:40 AM  Result Value Ref Range   Glucose-Capillary 143 (H) 70 - 99 mg/dL  CBC     Status: Abnormal   Collection Time: 01/24/21  3:57 AM  Result Value Ref Range   WBC 9.6 4.0 - 10.5 K/uL   RBC 2.84 (L) 4.22 - 5.81 MIL/uL   Hemoglobin 7.5 (L) 13.0 - 17.0 g/dL    HCT 24.4 (L) 39.0 - 52.0 %   MCV 85.9 80.0 - 100.0 fL   MCH 26.4 26.0 - 34.0 pg   MCHC 30.7 30.0 - 36.0 g/dL   RDW 18.8 (H) 11.5 - 15.5 %   Platelets 182 150 - 400 K/uL   nRBC 0.0 0.0 - 0.2 %  APTT     Status: Abnormal   Collection Time: 01/24/21  3:57 AM  Result Value Ref Range   aPTT 57 (H) 24 - 36 seconds  Renal function panel     Status: Abnormal   Collection Time: 01/24/21  3:57 AM  Result Value Ref Range   Sodium 134 (L) 135 - 145 mmol/L   Potassium 4.6 3.5 - 5.1 mmol/L   Chloride 101 98 - 111 mmol/L   CO2 24 22 - 32 mmol/L   Glucose, Bld 146 (H) 70 - 99 mg/dL  BUN 61 (H) 8 - 23 mg/dL   Creatinine, Ser 1.87 (H) 0.61 - 1.24 mg/dL   Calcium 8.7 (L) 8.9 - 10.3 mg/dL   Phosphorus 4.3 2.5 - 4.6 mg/dL   Albumin 2.3 (L) 3.5 - 5.0 g/dL   GFR, Estimated 38 (L) >60 mL/min   Anion gap 9 5 - 15  Magnesium     Status: Abnormal   Collection Time: 01/24/21  3:57 AM  Result Value Ref Range   Magnesium 2.7 (H) 1.7 - 2.4 mg/dL  Renal function panel (daily at 0500)     Status: Abnormal   Collection Time: 01/24/21  6:43 AM  Result Value Ref Range   Sodium 135 135 - 145 mmol/L   Potassium 4.5 3.5 - 5.1 mmol/L   Chloride 103 98 - 111 mmol/L   CO2 24 22 - 32 mmol/L   Glucose, Bld 105 (H) 70 - 99 mg/dL   BUN 58 (H) 8 - 23 mg/dL   Creatinine, Ser 1.77 (H) 0.61 - 1.24 mg/dL   Calcium 8.3 (L) 8.9 - 10.3 mg/dL   Phosphorus 3.8 2.5 - 4.6 mg/dL   Albumin 2.1 (L) 3.5 - 5.0 g/dL   GFR, Estimated 41 (L) >60 mL/min   Anion gap 8 5 - 15  Glucose, capillary     Status: Abnormal   Collection Time: 01/24/21  8:05 AM  Result Value Ref Range   Glucose-Capillary 108 (H) 70 - 99 mg/dL    Assessment & Plan: Present on Admission: **None**    LOS: 27 days   Additional comments:I reviewed the patient's new clinical lab test results. And CXR Fall down stairs 8/12   VDRF - guaifenisen, wean, S/P trach 8/29 by Dr. Bobbye Morton. On Jagual now ID - off abx, no fevers, completed maxipime/ampicillin 8/31,  CT A/P with ascending colitis and distention. Stool studies and C. dif are all negative, has foul secretions so resp CX sent today TBI/SAH/SDH - NSGY c/s, Dr. Annette Stable. Significant frontal lobe injuries. Keppra x7d for sz ppx. Occipital bone fx - NSGY c/s, Dr. Annette Stable Temporal bone fx extending into middle ear - ENT c/s, Dr. Constance Holster Right TM Rupture - ENT c/s, Dr. Constance Holster AFRVR -  cardene, hydral [phone].   ABL anemia - stable Bilateral pulmonary embolism - bivalirudin AKI - CRRT per Renal. Tolerating fluid removal. Hx DM2 - resistant SSI, novolog q4, glargine  50u BID Hx HTN - PRN meds FEN - NPO, TF, Klonopin, dex now off VTE - SCDs, bival gtt Dispo - ICU, CRRT, HTC as able Critical Care Total Time*: 35 Minutes  Georganna Skeans, MD, MPH, FACS Trauma & General Surgery Use [email or web address] to contact on call provider  01/24/2021  *Care during the described time interval was provided by me. I have reviewed this patient's available data, including medical history, events of note, physical examination and test results as part of my evaluation.

## 2021-01-24 NOTE — Progress Notes (Signed)
ANTICOAGULATION CONSULT NOTE  Pharmacy Consult for bivalirudin Indication: atrial fibrillation, DVT, PE 8/23   No Known Allergies  Patient Measurements: Height: 6\' 2"  (188 cm) Weight: 117.9 kg (259 lb 14.8 oz) IBW/kg (Calculated) : 82.2 Heparin Dosing Weight: 107kg  Vital Signs: Temp: 97.7 F (36.5 C) (09/08 1200) Temp Source: Oral (09/08 1200) BP: 144/79 (09/08 1330) Pulse Rate: 84 (09/08 1330)  Labs: Recent Labs    01/22/21 0500 01/22/21 1854 01/23/21 0412 01/23/21 1544 01/24/21 0357 01/24/21 0643  HGB 7.4*  --  7.5*  --  7.5*  --   HCT 24.5*  --  24.1*  --  24.4*  --   PLT 262  --  205  --  182  --   APTT 60*  --  57*  --  57*  --   CREATININE 2.09*   < > 1.82* 1.90* 1.87* 1.77*   < > = values in this interval not displayed.     Estimated Creatinine Clearance: 53 mL/min (A) (by C-G formula based on SCr of 1.77 mg/dL (H)).   Assessment: 54 YOM presenting s/p fall with TBI/SAH and facial fx, in afib started on amiodarone and now cleared per trauma for full dose anticoagulation. 8/23 patient found to have small acute bilateral PE and age-indeterminate LUE DVT. Pharmacy consulted to dose bivalirudin per Trauma.  Given recent head bleed, will aim for middle of therapeutic range aptt and watch closely for signs and symptoms of bleeding.   aPTT this morning remains therapeutic (aPTT 57, goal of 50-65), H/H stable - no active bleeding noted. Plt normalized   Goal of Therapy:  Aptt goal ~50-65s per discussion with Trauma  Monitor platelets by anticoagulation protocol: Yes   Plan:  - Continue Bivalirudin at 0.02 mg/kg/hr - Daily aPTT monitoring - Will continue to monitor for any signs/symptoms of bleeding  Thank you for allowing pharmacy to be a part of this patient's care.  Alycia Rossetti, PharmD, BCPS Clinical Pharmacist Clinical phone for 01/24/2021: [phone] 01/24/2021 2:12 PM   **Pharmacist phone directory can now be found on [email or web address] ([email or web address]).  Listed  under [place].

## 2021-01-24 NOTE — Progress Notes (Addendum)
Progress Note  Patient Name: Angel [name] Date of Encounter: 01/24/2021  [facility] Cardiologist: Werner Lean, MD   Subjective   Awake and alert this morning. Follows simple commands. RN at bedside. Had some bradycardia and hypotension overnight in the setting of narcotics administration and precedex. Improved this AM off precedex  Inpatient Medications    Scheduled Meds:  acetaminophen  1,000 mg Per Tube Q6H   chlorhexidine gluconate (MEDLINE KIT)  15 mL Mouth Rinse BID   Chlorhexidine Gluconate Cloth  6 each Topical Q0600   clonazePAM  0.5 mg Per Tube BID   docusate  100 mg Per Tube BID   feeding supplement (PROSource TF)  90 mL Per Tube BID   guaiFENesin  10 mL Per Tube Q4H   hydrALAZINE  25 mg Per Tube Q8H   insulin aspart  0-20 Units Subcutaneous Q4H   insulin aspart  10 Units Subcutaneous Q4H   insulin glargine-yfgn  50 Units Subcutaneous BID   mouth rinse  15 mL Mouth Rinse 10 times per day   methocarbamol  1,000 mg Per Tube Q8H   pantoprazole sodium  40 mg Per Tube Daily   polyethylene glycol  17 g Per Tube Daily   QUEtiapine  100 mg Per Tube BID   senna  1 tablet Per Tube Daily   sodium chloride flush  10-40 mL Intracatheter Q12H   Continuous Infusions:   prismasol BGK 4/2.5 500 mL/hr at 01/24/21 0227    prismasol BGK 4/2.5 300 mL/hr at 01/24/21 0711   sodium chloride     amiodarone 30 mg/hr (01/24/21 0900)   bivalirudin (ANGIOMAX) infusion 0.5 mg/mL (Non-ACS indications) 0.02 mg/kg/hr (01/24/21 0900)   dexmedetomidine (PRECEDEX) IV infusion Stopped (01/24/21 0651)   feeding supplement (PIVOT 1.5 CAL) 1,000 mL (01/23/21 1611)   niCARDipine Stopped (01/22/21 2147)   prismasol BGK 4/2.5 1,500 mL/hr at 01/24/21 0801   PRN Meds: Place/Maintain arterial line **AND** sodium chloride, artificial tears, heparin, hydrALAZINE, HYDROmorphone (DILAUDID) injection, midazolam, ondansetron **OR** ondansetron (ZOFRAN) IV, oxyCODONE, sodium chloride flush    Vital Signs    Vitals:   01/24/21 0815 01/24/21 0830 01/24/21 0845 01/24/21 0853  BP: (!) 145/68 (!) 159/67 140/64 140/64  Pulse: (!) 58 73 63 68  Resp: _0 (!) 22  Temp:      TempSrc:      SpO2: 100% 100% 100% 100%  Weight:      Height:        Intake/Output Summary (Last 24 hours) at 01/24/2021 0919 Last data filed at 01/24/2021 0900 Gross per 24 hour  Intake 3003.33 ml  Output 5312 ml  Net -2308.67 ml   Last 3 Weights 01/24/2021 01/23/2021 01/22/2021  Weight (lbs) 259 lb 14.8 oz 266 lb 1.5 oz 276 lb 0.3 oz  Weight (kg) 117.9 kg 120.7 kg 125.2 kg      Telemetry    Maintaining sinus rhythm since 01/22/21, occasional PVCs, bradycardic to the 40s overnight- Personally Reviewed  ECG    No new tracings - Personally Reviewed  Physical Exam   GEN: Ill appearing gentleman with trach tube in place Neck: unable to assess JVD Cardiac: RRR, no murmurs, rubs, or gallops.  Respiratory: Coarse breath sounds bilaterally anteriorly GI: Soft, nontender, non-distended  MS: No edema; No deformity. Neuro:  Calm, tracks movement with eyes, nods appropriately, moving extremities  Labs    High Sensitivity Troponin:   Recent Labs  Lab 01/10/21 1346 01/10/21 1518  TROPONINIHS 52* 56*  Chemistry Recent Labs  Lab 01/23/21 1544 01/24/21 0357 01/24/21 0643  NA 134* 134* 135  K 4.7 4.6 4.5  CL 101 101 103  CO2 _0 GLUCOSE 135* 146* 105*  BUN 65* 61* 58*  CREATININE 1.90* 1.87* 1.77*  CALCIUM 8.7* 8.7* 8.3*  ALBUMIN 2.3* 2.3* 2.1*  GFRNONAA 37* 38* 41*  ANIONGAP _1 Hematology Recent Labs  Lab 01/22/21 0500 01/23/21 0412 01/24/21 0357  WBC 10.3 11.2* 9.6  RBC 2.83* 2.80* 2.84*  HGB 7.4* 7.5* 7.5*  HCT 24.5* 24.1* 24.4*  MCV 86.6 86.1 85.9  MCH 26.1 26.8 26.4  MCHC 30.2 31.1 30.7  RDW 19.6* 19.1* 18.8*  PLT 262 205 182    BNPNo results for input(s): BNP, PROBNP in the last 168 hours.   DDimer No results for input(s): DDIMER in the last 168  hours.   Radiology    DG CHEST PORT 1 VIEW  Result Date: 01/23/2021 CLINICAL DATA:  Central line placement EXAM: PORTABLE CHEST 1 VIEW COMPARISON:  Same day radiograph FINDINGS: Unchanged cardiomediastinal silhouette. Unchanged tracheostomy tube, right upper extremity PICC, and partially imaged enteric feeding tube. Persistent bilateral effusions and diffuse bilateral interstitial opacities. No visible pneumothorax. Bones are unchanged. The left approach central venous catheter tip overlies the distal superior vena cava as clinically questioned. IMPRESSION: Left upper central venous catheter tip overlies the distal superior vena cava. Unchanged pulmonary edema and layering bilateral pleural effusions. Electronically Signed   By: Maurine Simmering M.D.   On: 01/23/2021 13:02   DG CHEST PORT 1 VIEW  Result Date: 01/23/2021 CLINICAL DATA:  Respiratory failure, tracheostomy EXAM: PORTABLE CHEST 1 VIEW COMPARISON:  01/17/2021 FINDINGS: No significant interval change in AP portable chest radiograph, with gross cardiomegaly and so port apparatus including tracheostomy, left chest large bore multi lumen vascular catheter, and right upper extremity PICC, tip again projecting over the right atrium. Partially imaged enteric feeding tube. Diffuse bilateral interstitial opacity and layering bilateral pleural effusions. No new airspace opacity. IMPRESSION: 1. No significant interval change in AP portable chest radiograph, with gross cardiomegaly, diffuse bilateral interstitial pulmonary opacity, and layering pleural effusions, constellation of findings most consistent with edema. 2. Unchanged support apparatus including tracheostomy, left chest large bore multi lumen vascular catheter, right upper extremity PICC, and partially imaged enteric feeding tube. Electronically Signed   By: Eddie Candle M.D.   On: 01/23/2021 09:10    Cardiac Studies     Echo 01/07/21: 1. Left ventricular ejection fraction, by estimation, is 60 to  65%. The  left ventricle has normal function. The left ventricle has no regional  wall motion abnormalities. There is mild left ventricular hypertrophy.  Left ventricular diastolic parameters  are indeterminate.   2. Right ventricule is poorly visualized but grossly normal size and  systolic function   3. Left atrial size was mildly dilated.   4. Right atrial size was mildly dilated.   5. The mitral valve is normal in structure. No evidence of mitral valve  regurgitation. No evidence of mitral stenosis.   6. The aortic valve was not well visualized. Aortic valve regurgitation  is not visualized. No aortic stenosis is present.   CTPE: Date: 01/08/21 Results: A. Bilateral PE B. 3V CAC and Aortic Atherosclerosis C. Multifocal lung consolidation D. Bilateral pleural effusion  Patient Profile     70 y.o. male with a PMH of hyperlipidemia, DM type II, who presented with fall resulting in subarachnoid hemorrhage requiring intubation  with hospital course complicated by Multi lobar pneumonia, PE, sepsis, AKI requiring CRRT, and new onset atrial fibrillation/flutter for which cardiology is following.  Assessment & Plan    1. New onset paroxysmal atrial flutter: patient has been in persistent atrial flutter with variable AV block in the setting of a subarachnoid hemorrhage. He was started on bivalirudin for anticoagulation for stroke ppx, in addition to findings of bilateral PE.  He was on a cardene gtt which was stopped 01/22/21. He has maintained sinus rhythm since 01/22/21 with intermittent bradycardia overnight. BP stabilized and he is off precedex. He has been maintained on an amiodarone gtt with plans to transition to per [employer] today if no rhythm issues overnight.  - Will transition to po amiodarone 250m BID x7 days, then 2061mdaily - Continue bivalirudin for stroke and PE ppx for now  2. Hypoxic respiratory failure: hospital course complicated by multilobar PNA and bilateral PE. He was  initially intubated and underwent trach placement 01/14/21. He completed a course of IV antibiotics for PNA. He remains on bivalirudin for management of his PE.  - Continue management per primary team  3. AKI: Cr peaked at 5.5 this admission. Improved on CRRT. Cr down to 1.77 today; baseline 0.9 - Continue CRRT per nephrology  4. Coronary artery calcifications: noted on CT this admission.  - Risk factor modifications to be addressed following recovery from acute illness(es)   Remainder of care per primary team: - TBI/SAH/SDH - Occipital bone/temporal bone fx extending into middle ear - Anemia - Colitis       For questions or updates, please contact [facility] Please consult [email or web address] for contact info under        Signed, KrAbigail ButtsPA-C  01/24/2021, 9:19 AM    Personally seen and examined. Agree with APP above with the following comments: - improving mental status - potential transition to HD today - will start PO transition to amiodarone today - tolerating well off precedex.  MaRudean HaskellMD [id number] [place] [id number]:03 PM

## 2021-01-24 NOTE — Progress Notes (Signed)
[facility] NEPHROLOGY PROGRESS NOTE  Assessment/ Plan:  #Acute kidney injury, oliguric: Multifactorial etiology including ischemic ATN in the setting of hypotension, sepsis complicated by contrast injury.  Started CRRT on 9/1 and has been tolerating well.  No heparin as he is on bivalirudin.   He remains anuric and electrolytes are acceptable therefore we will continue current CRRT prescription.  The HD catheter was changed on 9/7 because of high catheter pressure.  Seems like working well.  Tolerating UF around 100 cc an hour.   #Fall/bilateral subarachnoid hemorrhage/SDH, TBI/occipital and temporal bone fracture: Per trauma team.  #Acute respiratory failure: Status post trach on 8/29 and on vent.  #A. fib with RVR: On amiodarone, Angiomax.  # Anemia of critical illness: Transfuse as needed.  #Metabolic acidosis: Managed with dialysis.  Discontinued oral sodium bicarbonate.  #Bilateral pulm embolism: Currently on anticoagulation.  Subjective: Seen and examined.  Opening eyes and following simple commands.  No urine output.  No issue with CRRT after changing new HD catheter.  Discussed with the nurse.  Objective Vital signs in last 24 hours: Vitals:   01/24/21 1000 01/24/21 1015 01/24/21 1030 01/24/21 1038  BP: (!) 152/60 (!) 156/72 (!) 171/72 140/61  Pulse: 79 77 79 74  Resp:  (!) 21 (!) 22 20  Temp:      TempSrc:      SpO2: 100% 100% 98% 97%  Weight:      Height:       Weight change: -2.8 kg  Intake/Output Summary (Last 24 hours) at 01/24/2021 1055 Last data filed at 01/24/2021 1025 Gross per 24 hour  Intake 2950.34 ml  Output 5251 ml  Net -2300.66 ml        Labs: Basic Metabolic Panel: Recent Labs  Lab 01/23/21 1544 01/24/21 0357 01/24/21 0643  NA 134* 134* 135  K 4.7 4.6 4.5  CL 101 101 103  CO2 _0 GLUCOSE 135* 146* 105*  BUN 65* 61* 58*  CREATININE 1.90* 1.87* 1.77*  CALCIUM 8.7* 8.7* 8.3*  PHOS 4.3 4.3 3.8    Liver Function  Tests: Recent Labs  Lab 01/23/21 1544 01/24/21 0357 01/24/21 0643  ALBUMIN 2.3* 2.3* 2.1*    No results for input(s): LIPASE, AMYLASE in the last 168 hours. No results for input(s): AMMONIA in the last 168 hours. CBC: Recent Labs  Lab 01/20/21 0513 01/21/21 0514 01/22/21 0500 01/23/21 0412 01/24/21 0357  WBC 14.2* 13.5* 10.3 11.2* 9.6  HGB 8.4* 7.9* 7.4* 7.5* 7.5*  HCT 27.2* 25.9* 24.5* 24.1* 24.4*  MCV 84.2 85.2 86.6 86.1 85.9  PLT 467* 387 262 205 182    Cardiac Enzymes: No results for input(s): CKTOTAL, CKMB, CKMBINDEX, TROPONINI in the last 168 hours. CBG: Recent Labs  Lab 01/23/21 1514 01/23/21 1955 01/23/21 2334 01/24/21 0340 01/24/21 0805  GLUCAP 90 76 119* 143* 108*     Iron Studies: No results for input(s): IRON, TIBC, TRANSFERRIN, FERRITIN in the last 72 hours. Studies/Results: DG CHEST PORT 1 VIEW  Result Date: 01/23/2021 CLINICAL DATA:  Central line placement EXAM: PORTABLE CHEST 1 VIEW COMPARISON:  Same day radiograph FINDINGS: Unchanged cardiomediastinal silhouette. Unchanged tracheostomy tube, right upper extremity PICC, and partially imaged enteric feeding tube. Persistent bilateral effusions and diffuse bilateral interstitial opacities. No visible pneumothorax. Bones are unchanged. The left approach central venous catheter tip overlies the distal superior vena cava as clinically questioned. IMPRESSION: Left upper central venous catheter tip overlies the distal superior vena cava. Unchanged pulmonary edema and  layering bilateral pleural effusions. Electronically Signed   By: Maurine Simmering M.D.   On: 01/23/2021 13:02   DG CHEST PORT 1 VIEW  Result Date: 01/23/2021 CLINICAL DATA:  Respiratory failure, tracheostomy EXAM: PORTABLE CHEST 1 VIEW COMPARISON:  01/17/2021 FINDINGS: No significant interval change in AP portable chest radiograph, with gross cardiomegaly and so port apparatus including tracheostomy, left chest large bore multi lumen vascular catheter,  and right upper extremity PICC, tip again projecting over the right atrium. Partially imaged enteric feeding tube. Diffuse bilateral interstitial opacity and layering bilateral pleural effusions. No new airspace opacity. IMPRESSION: 1. No significant interval change in AP portable chest radiograph, with gross cardiomegaly, diffuse bilateral interstitial pulmonary opacity, and layering pleural effusions, constellation of findings most consistent with edema. 2. Unchanged support apparatus including tracheostomy, left chest large bore multi lumen vascular catheter, right upper extremity PICC, and partially imaged enteric feeding tube. Electronically Signed   By: Eddie Candle M.D.   On: 01/23/2021 09:10    Medications: Infusions:   prismasol BGK 4/2.5 500 mL/hr at 01/24/21 0227    prismasol BGK 4/2.5 300 mL/hr at 01/24/21 0711   sodium chloride     amiodarone 30 mg/hr (01/24/21 1015)   bivalirudin (ANGIOMAX) infusion 0.5 mg/mL (Non-ACS indications) 0.02 mg/kg/hr (01/24/21 1034)   dexmedetomidine (PRECEDEX) IV infusion Stopped (01/24/21 0651)   feeding supplement (PIVOT 1.5 CAL) 1,000 mL (01/23/21 1611)   niCARDipine Stopped (01/22/21 2147)   prismasol BGK 4/2.5 1,500 mL/hr at 01/24/21 0801    Scheduled Medications:  acetaminophen  1,000 mg Per Tube Q6H   chlorhexidine gluconate (MEDLINE KIT)  15 mL Mouth Rinse BID   Chlorhexidine Gluconate Cloth  6 each Topical Q0600   clonazePAM  0.5 mg Per Tube BID   docusate  100 mg Per Tube BID   feeding supplement (PROSource TF)  90 mL Per Tube BID   guaiFENesin  10 mL Per Tube Q4H   hydrALAZINE  25 mg Per Tube Q8H   insulin aspart  0-20 Units Subcutaneous Q4H   insulin aspart  10 Units Subcutaneous Q4H   insulin glargine-yfgn  50 Units Subcutaneous BID   mouth rinse  15 mL Mouth Rinse 10 times per day   methocarbamol  1,000 mg Per Tube Q8H   pantoprazole sodium  40 mg Per Tube Daily   polyethylene glycol  17 g Per Tube Daily   QUEtiapine  100 mg Per  Tube BID   senna  1 tablet Per Tube Daily   sodium chloride flush  10-40 mL Intracatheter Q12H    have reviewed scheduled and prn medications.  Physical Exam: General: Critically ill looking male, trach on vent, alert, opening eyes and following simple commands Heart: Tachycardic, s1s2 nl Lungs: Coarse breath sound bilateral. Abdomen:soft, nontender. Extremities: edema+ Dialysis Access: Left subclavian temporary HD catheter placed on 9/7.  Tanish Prien Prasad Romulus Hanrahan 01/24/2021,10:55 AM  LOS: 27 days

## 2021-01-25 DIAGNOSIS — I609 Nontraumatic subarachnoid hemorrhage, unspecified: Secondary | ICD-10-CM | POA: Diagnosis not present

## 2021-01-25 DIAGNOSIS — I480 Paroxysmal atrial fibrillation: Secondary | ICD-10-CM | POA: Diagnosis not present

## 2021-01-25 LAB — GLUCOSE, CAPILLARY
Glucose-Capillary: 222 mg/dL — ABNORMAL HIGH (ref 70–99)
Glucose-Capillary: 211 mg/dL — ABNORMAL HIGH (ref 70–99)
Glucose-Capillary: 177 mg/dL — ABNORMAL HIGH (ref 70–99)
Glucose-Capillary: 185 mg/dL — ABNORMAL HIGH (ref 70–99)
Glucose-Capillary: 159 mg/dL — ABNORMAL HIGH (ref 70–99)
Glucose-Capillary: 185 mg/dL — ABNORMAL HIGH (ref 70–99)

## 2021-01-25 LAB — RENAL FUNCTION PANEL
Albumin: 2.5 g/dL — ABNORMAL LOW (ref 3.5–5.0)
Anion gap: 12 (ref 5–15)
BUN: 53 mg/dL — ABNORMAL HIGH (ref 8–23)
CO2: 25 mmol/L (ref 22–32)
Calcium: 9 mg/dL (ref 8.9–10.3)
Chloride: 96 mmol/L — ABNORMAL LOW (ref 98–111)
Creatinine, Ser: 1.84 mg/dL — ABNORMAL HIGH (ref 0.61–1.24)
GFR, Estimated: 39 mL/min — ABNORMAL LOW (ref >60)
Glucose, Bld: 219 mg/dL — ABNORMAL HIGH (ref 70–99)
Phosphorus: 4.1 mg/dL (ref 2.5–4.6)
Potassium: 4.8 mmol/L (ref 3.5–5.1)
Sodium: 133 mmol/L — ABNORMAL LOW (ref 135–145)

## 2021-01-25 LAB — CBC
HCT: 26.1 % — ABNORMAL LOW (ref 39.0–52.0)
Hemoglobin: 8.1 g/dL — ABNORMAL LOW (ref 13.0–17.0)
MCH: 26.4 pg (ref 26.0–34.0)
MCHC: 31 g/dL (ref 30.0–36.0)
MCV: 85 fL (ref 80.0–100.0)
Platelets: 191 10*3/uL (ref 150–400)
RBC: 3.07 MIL/uL — ABNORMAL LOW (ref 4.22–5.81)
RDW: 18.7 % — ABNORMAL HIGH (ref 11.5–15.5)
WBC: 13.5 10*3/uL — ABNORMAL HIGH (ref 4.0–10.5)
nRBC: 0 % (ref 0.0–0.2)

## 2021-01-25 LAB — MAGNESIUM: Magnesium: 2.7 mg/dL — ABNORMAL HIGH (ref 1.7–2.4)

## 2021-01-25 LAB — APTT: aPTT: 52 s — ABNORMAL HIGH (ref 24–36)

## 2021-01-25 NOTE — Progress Notes (Signed)
Patient ID: Angel [name], male   DOB: [birth year], 70 y.o.   MRN: [id number] Follow up - Trauma Critical Care  Patient Details:    Angel [name] is an 70 y.o. male.  Lines/tubes : PICC Triple Lumen 25/42/70 PICC Right Basilic 47 cm 1 cm (Active)  Indication for Insertion or Continuance of Line Poor Vasculature-patient has had multiple peripheral attempts or PIVs lasting less than 24 hours;Limited venous access - need for IV therapy >5 days (PICC only);Prolonged intravenous therapies 01/24/21 2000  Exposed Catheter (cm) 1 cm 01/09/21 6237  Site Assessment Clean;Dry;Intact 01/24/21 2000  Lumen #1 Status Flushed;Saline locked;Blood return noted 01/24/21 2000  Lumen #2 Status Flushed;Blood return noted;Infusing 01/24/21 2000  Lumen #3 Status Blood return noted;Flushed;In-line blood sampling system in place 01/24/21 2000  Dressing Type Transparent;Securing device 01/24/21 2000  Dressing Status Clean;Dry;Intact 01/23/21 0800  Antimicrobial disc in place? Yes 01/24/21 2000  Safety Lock Not Applicable 62/83/15 1761  Line Care Lumen 1 cap changed;Lumen 2 cap changed;Lumen 3 cap changed 01/24/21 1815  Dressing Intervention Dressing changed;Antimicrobial disc changed;Securement device changed 01/24/21 1815  Dressing Change Due 01/31/21 01/24/21 2000     External Urinary Catheter (Active)  Collection Container Standard drainage bag 01/24/21 2000  Suction (Verified suction is between 40-80 mmHg) N/A (Patient has condom catheter) 01/24/21 2000  Securement Method Securing device (Describe) 01/24/21 2000  Site Assessment Clean;Intact 01/24/21 2000  Intervention Male External Urinary Catheter Replaced 01/24/21 1445  Output (mL) 10 mL 01/25/21 0800     Fecal Management System (Active)  Does patient meet criteria for removal? No 01/24/21 2000  Daily care Bag changed (every 24 hours) 01/24/21 1517  Output (mL) 50 mL 01/25/21 0800  Intake (mL) 30 mL 01/24/21 1025    Microbiology/Sepsis markers: Results  for orders placed or performed during the hospital encounter of 12/28/20  Resp Panel by RT-PCR (Flu A&B, Covid) Nasopharyngeal Swab     Status: None   Collection Time: 12/28/20  4:17 PM   Specimen: Nasopharyngeal Swab; Nasopharyngeal(NP) swabs in vial transport medium  Result Value Ref Range Status   SARS Coronavirus 2 by RT PCR NEGATIVE NEGATIVE Final    Comment: (NOTE) SARS-CoV-2 target nucleic acids are NOT DETECTED.  The SARS-CoV-2 RNA is generally detectable in upper respiratory specimens during the acute phase of infection. The lowest concentration of SARS-CoV-2 viral copies this assay can detect is 138 copies/mL. A negative result does not preclude SARS-Cov-2 infection and should not be used as the sole basis for treatment or other patient management decisions. A negative result may occur with  improper specimen collection/handling, submission of specimen other than nasopharyngeal swab, presence of viral mutation(s) within the areas targeted by this assay, and inadequate number of viral copies(<138 copies/mL). A negative result must be combined with clinical observations, patient history, and epidemiological information. The expected result is Negative.  Fact Sheet for Patients:  EntrepreneurPulse.com.au  Fact Sheet for Healthcare Providers:  IncredibleEmployment.be  This test is no t yet approved or cleared by the [place] FDA and  has been authorized for detection and/or diagnosis of SARS-CoV-2 by FDA under an Emergency Use Authorization (EUA). This EUA will remain  in effect (meaning this test can be used) for the duration of the COVID-19 declaration under Section 564(b)(1) of the Act, 21 U.S.C.section 360bbb-3(b)(1), unless the authorization is terminated  or revoked sooner.       Influenza A by PCR NEGATIVE NEGATIVE Final   Influenza B by PCR NEGATIVE NEGATIVE Final  Comment: (NOTE) The Xpert Xpress SARS-CoV-2/FLU/RSV plus  assay is intended as an aid in the diagnosis of influenza from Nasopharyngeal swab specimens and should not be used as a sole basis for treatment. Nasal washings and aspirates are unacceptable for Xpert Xpress SARS-CoV-2/FLU/RSV testing.  Fact Sheet for Patients: EntrepreneurPulse.com.au  Fact Sheet for Healthcare Providers: IncredibleEmployment.be  This test is not yet approved or cleared by the [place] FDA and has been authorized for detection and/or diagnosis of SARS-CoV-2 by FDA under an Emergency Use Authorization (EUA). This EUA will remain in effect (meaning this test can be used) for the duration of the COVID-19 declaration under Section 564(b)(1) of the Act, 21 U.S.C. section 360bbb-3(b)(1), unless the authorization is terminated or revoked.  Performed at [facility], [place] [address] [place], [place] [address]   MRSA Next Gen by PCR, Nasal     Status: None   Collection Time: 12/28/20  7:32 PM   Specimen: Nasal Mucosa; Nasal Swab  Result Value Ref Range Status   MRSA by PCR Next Gen NOT DETECTED NOT DETECTED Final    Comment: (NOTE) The GeneXpert MRSA Assay (FDA approved for NASAL specimens only), is one component of a comprehensive MRSA colonization surveillance program. It is not intended to diagnose MRSA infection nor to guide or monitor treatment for MRSA infections. Test performance is not FDA approved in patients less than 28 years old. Performed at [facility], [place] [address], [place], [place] [address]   Culture, Respiratory w Gram Stain     Status: None   Collection Time: 12/31/20 11:06 AM   Specimen: Tracheal Aspirate; Respiratory  Result Value Ref Range Status   Specimen Description TRACHEAL ASPIRATE  Final   Special Requests NONE  Final   Gram Stain   Final    FEW SQUAMOUS EPITHELIAL CELLS PRESENT FEW WBC PRESENT,BOTH PMN AND MONONUCLEAR FEW GRAM POSITIVE COCCI Performed at [place], [place] [address], [place], [place] [address]    Culture   Final    FEW PSEUDOMONAS AERUGINOSA FEW STREPTOCOCCUS PNEUMONIAE    Report Status 01/03/2021 FINAL  Final   Organism ID, Bacteria PSEUDOMONAS AERUGINOSA  Final   Organism ID, Bacteria STREPTOCOCCUS PNEUMONIAE  Final      Susceptibility   Pseudomonas aeruginosa - MIC*    CEFTAZIDIME 4 SENSITIVE Sensitive     CIPROFLOXACIN <=0.25 SENSITIVE Sensitive     GENTAMICIN <=1 SENSITIVE Sensitive     IMIPENEM 2 SENSITIVE Sensitive     PIP/TAZO 8 SENSITIVE Sensitive     CEFEPIME 2 SENSITIVE Sensitive     * FEW PSEUDOMONAS AERUGINOSA   Streptococcus pneumoniae - MIC*    ERYTHROMYCIN 4 RESISTANT Resistant     LEVOFLOXACIN 0.5 SENSITIVE Sensitive     VANCOMYCIN <=0.12 SENSITIVE Sensitive     PENO - penicillin <=0.06      PENICILLIN (non-meningitis) <=0.06 SENSITIVE Sensitive     PENICILLIN (oral) <=0.06 SENSITIVE Sensitive     CEFTRIAXONE (non-meningitis) <=0.12 SENSITIVE Sensitive     * FEW STREPTOCOCCUS PNEUMONIAE  Culture, blood (routine x 2)     Status: None   Collection Time: 01/05/21 11:44 AM   Specimen: BLOOD  Result Value Ref Range Status   Specimen Description BLOOD SITE NOT SPECIFIED  Final   Special Requests AEROBIC BOTTLE ONLY Blood Culture adequate volume  Final   Culture   Final    NO GROWTH 5 DAYS Performed at [facility], [address],  [place] [address]    Report Status 01/10/2021 FINAL  Final  Culture, blood (routine x 2)     Status: None   Collection Time: 01/05/21 11:44 AM   Specimen: BLOOD  Result Value Ref Range Status   Specimen Description BLOOD SITE NOT SPECIFIED  Final   Special Requests   Final    AEROBIC BOTTLE ONLY Blood Culture results may not be optimal due to an inadequate volume of blood received in culture bottles   Culture   Final    NO GROWTH 5 DAYS Performed at [facility], [place] [address], [place], [place] [address]    Report Status 01/10/2021 FINAL  Final  Surgical  PCR screen     Status: None   Collection Time: 01/08/21 12:14 AM   Specimen: Nasal Mucosa; Nasal Swab  Result Value Ref Range Status   MRSA, PCR NEGATIVE NEGATIVE Final   Staphylococcus aureus NEGATIVE NEGATIVE Final    Comment: (NOTE) The Xpert SA Assay (FDA approved for NASAL specimens in patients 69 years of age and older), is one component of a comprehensive surveillance program. It is not intended to diagnose infection nor to guide or monitor treatment. Performed at [facility], [place] [address], [place], [place] [address]   Culture, Respiratory w Gram Stain     Status: None   Collection Time: 01/08/21  1:24 PM   Specimen: Tracheal Aspirate; Respiratory  Result Value Ref Range Status   Specimen Description TRACHEAL ASPIRATE  Final   Special Requests NONE  Final   Gram Stain   Final    RARE SQUAMOUS EPITHELIAL CELLS PRESENT MODERATE WBC PRESENT, PREDOMINANTLY MONONUCLEAR FEW GRAM NEGATIVE RODS Performed at [facility], [place] [address] [place] [address], [place], [place] [address]    Culture   Final    RARE PSEUDOMONAS AERUGINOSA RARE ENTEROCOCCUS FAECALIS    Report Status 01/11/2021 FINAL  Final   Organism ID, Bacteria PSEUDOMONAS AERUGINOSA  Final   Organism ID, Bacteria ENTEROCOCCUS FAECALIS  Final      Susceptibility   Enterococcus faecalis - MIC*    AMPICILLIN <=2 SENSITIVE Sensitive     VANCOMYCIN 1 SENSITIVE Sensitive     GENTAMICIN SYNERGY SENSITIVE Sensitive     * RARE ENTEROCOCCUS FAECALIS   Pseudomonas aeruginosa - MIC*    CEFTAZIDIME 4 SENSITIVE Sensitive     CIPROFLOXACIN <=0.25 SENSITIVE Sensitive     GENTAMICIN <=1 SENSITIVE Sensitive     IMIPENEM 2 SENSITIVE Sensitive     PIP/TAZO 8 SENSITIVE Sensitive     CEFEPIME 2 SENSITIVE Sensitive     * RARE PSEUDOMONAS AERUGINOSA  Gastrointestinal Panel by PCR , Stool     Status: None   Collection Time: 01/08/21  5:04 PM   Specimen: Stool  Result Value Ref Range Status   Campylobacter species NOT DETECTED NOT  DETECTED Final   Plesimonas shigelloides NOT DETECTED NOT DETECTED Final   Salmonella species NOT DETECTED NOT DETECTED Final   Yersinia enterocolitica NOT DETECTED NOT DETECTED Final   Vibrio species NOT DETECTED NOT DETECTED Final   Vibrio cholerae NOT DETECTED NOT DETECTED Final   Enteroaggregative E coli (EAEC) NOT DETECTED NOT DETECTED Final   Enteropathogenic E coli (EPEC) NOT DETECTED NOT DETECTED Final   Enterotoxigenic E coli (ETEC) NOT DETECTED NOT DETECTED Final   Shiga like toxin producing E coli (STEC) NOT DETECTED NOT DETECTED Final   Shigella/Enteroinvasive E coli (EIEC) NOT DETECTED NOT DETECTED Final   Cryptosporidium NOT DETECTED NOT DETECTED  Final   Cyclospora cayetanensis NOT DETECTED NOT DETECTED Final   Entamoeba histolytica NOT DETECTED NOT DETECTED Final   Giardia lamblia NOT DETECTED NOT DETECTED Final   Adenovirus F40/41 NOT DETECTED NOT DETECTED Final   Astrovirus NOT DETECTED NOT DETECTED Final   Norovirus GI/GII NOT DETECTED NOT DETECTED Final   Rotavirus A NOT DETECTED NOT DETECTED Final   Sapovirus (I, II, IV, and V) NOT DETECTED NOT DETECTED Final    Comment: Performed at [facility], [place], [place] [address]  C Difficile Quick Screen (NO PCR Reflex)     Status: None   Collection Time: 01/09/21 11:07 AM   Specimen: STOOL  Result Value Ref Range Status   C Diff antigen NEGATIVE NEGATIVE Final   C Diff toxin NEGATIVE NEGATIVE Final   C Diff interpretation No C. difficile detected.  Final    Comment: Performed at [facility], [place] [address], [place], [place] [address]  Culture, Respiratory w Gram Stain     Status: None (Preliminary result)   Collection Time: 01/24/21  8:57 AM   Specimen: Tracheal Aspirate; Respiratory  Result Value Ref Range Status   Specimen Description TRACHEAL ASPIRATE  Final   Special Requests NONE  Final   Gram Stain   Final    ABUNDANT WBC PRESENT, PREDOMINANTLY MONONUCLEAR RARE FEW GRAM  NEGATIVE RODS Performed at [facility], [place] [address] [place], [place] [address]    Culture PENDING  Incomplete   Report Status PENDING  Incomplete    Anti-infectives:  Anti-infectives (From admission, onward)    Start     Dose/Rate Route Frequency Ordered Stop   01/11/21 2330  ceFEPIme (MAXIPIME) 2 g in sodium chloride 0.9 % 100 mL IVPB  Status:  Discontinued        2 g 200 mL/hr over 30 Minutes Intravenous Every 24 hours 01/11/21 0711 01/16/21 0907   01/10/21 1645  ampicillin (OMNIPEN) 2 g in sodium chloride 0.9 % 100 mL IVPB  Status:  Discontinued        2 g 300 mL/hr over 20 Minutes Intravenous Every 8 hours 01/10/21 1549 01/16/21 0907   01/09/21 2200  ceFEPIme (MAXIPIME) 2 g in sodium chloride 0.9 % 100 mL IVPB  Status:  Discontinued        2 g 200 mL/hr over 30 Minutes Intravenous Every 12 hours 01/09/21 1458 01/11/21 0711   01/08/21 1515  metroNIDAZOLE (FLAGYL) IVPB 500 mg  Status:  Discontinued        500 mg 100 mL/hr over 60 Minutes Intravenous Every 8 hours 01/08/21 1428 01/10/21 1618   01/03/21 0600  vancomycin (VANCOREADY) IVPB 1250 mg/250 mL  Status:  Discontinued        1,250 mg 166.7 mL/hr over 90 Minutes Intravenous Every 12 hours 01/02/21 1717 01/03/21 0837   01/02/21 1800  vancomycin (VANCOREADY) IVPB 2000 mg/400 mL        2,000 mg 200 mL/hr over 120 Minutes Intravenous  Once 01/02/21 1712 01/02/21 2007   01/02/21 0900  ceFEPIme (MAXIPIME) 2 g in sodium chloride 0.9 % 100 mL IVPB  Status:  Discontinued        2 g 200 mL/hr over 30 Minutes Intravenous Every 8 hours 01/02/21 0849 01/09/21 1458       Best Practice/Protocols:  VTE Prophylaxis: Direct Thrombin Inhibitor Intermittent Sedation  Consults: Treatment Team:  Georganna Skeans, MD Roney Jaffe, MD    Studies:    Events:  Subjective:  Overnight Issues:   Objective:  Vital signs for last 24 hours: Temp:  [97.7 F (36.5 C)-98.7 F (37.1 C)] 98.2 F (36.8 C) (09/09  0400) Pulse Rate:  [58-220] 69 (09/09 0725) Resp:  [15-29] 18 (09/09 0725) BP: (91-184)/(54-133) 130/67 (09/09 0725) SpO2:  [76 %-100 %] 100 % (09/09 0725) FiO2 (%):  [40 %] 40 % (09/09 0725)  Hemodynamic parameters for last 24 hours: CVP:  [0 mmHg-22 mmHg] 8 mmHg  Intake/Output from previous day: 09/08 0701 - 09/09 0700 In: 2194 [P.O.:120; I.V.:285; NG/GT:1759] Out: 8101 [Urine:50; Stool:275]  Intake/Output this shift: Total I/O In: 85.2 [I.V.:5.2; NG/GT:80] Out: 307 [Urine:10; Other:247; Stool:50]  Vent settings for last 24 hours: Vent Mode: PSV;CPAP FiO2 (%):  [40 %] 40 % Set Rate:  [20 bmp] 20 bmp Vt Set:  [650 mL] 650 mL PEEP:  [5 cmH20] 5 cmH20 Pressure Support:  [8 cmH20] 8 cmH20 Plateau Pressure:  [16 cmH20-23 cmH20] 16 cmH20  Physical Exam:  General: awake on vent wean Neuro: F/C HEENT/Neck: trach-clean, intact Resp: clear to auscultation bilaterally CVS: RRR GI: soft, NT Extremities: less edema  Results for orders placed or performed during the hospital encounter of 12/28/20 (from the past 24 hour(s))  Culture, Respiratory w Gram Stain     Status: None (Preliminary result)   Collection Time: 01/24/21  8:57 AM   Specimen: Tracheal Aspirate; Respiratory  Result Value Ref Range   Specimen Description TRACHEAL ASPIRATE    Special Requests NONE    Gram Stain      ABUNDANT WBC PRESENT, PREDOMINANTLY MONONUCLEAR RARE FEW GRAM NEGATIVE RODS Performed at [facility], [place] [address], [place], [place] [address]    Culture PENDING    Report Status PENDING   Glucose, capillary     Status: Abnormal   Collection Time: 01/24/21 12:11 PM  Result Value Ref Range   Glucose-Capillary 173 (H) 70 - 99 mg/dL  Glucose, capillary     Status: Abnormal   Collection Time: 01/24/21  4:11 PM  Result Value Ref Range   Glucose-Capillary 119 (H) 70 - 99 mg/dL  Renal function panel (daily at 1600)     Status: Abnormal   Collection Time: 01/24/21  4:35 PM  Result Value Ref  Range   Sodium 134 (L) 135 - 145 mmol/L   Potassium 4.8 3.5 - 5.1 mmol/L   Chloride 96 (L) 98 - 111 mmol/L   CO2 25 22 - 32 mmol/L   Glucose, Bld 132 (H) 70 - 99 mg/dL   BUN 57 (H) 8 - 23 mg/dL   Creatinine, Ser 1.77 (H) 0.61 - 1.24 mg/dL   Calcium 8.9 8.9 - 10.3 mg/dL   Phosphorus 4.1 2.5 - 4.6 mg/dL   Albumin 2.6 (L) 3.5 - 5.0 g/dL   GFR, Estimated 41 (L) >60 mL/min   Anion gap 13 5 - 15  Glucose, capillary     Status: Abnormal   Collection Time: 01/24/21  7:56 PM  Result Value Ref Range   Glucose-Capillary 167 (H) 70 - 99 mg/dL  Glucose, capillary     Status: Abnormal   Collection Time: 01/24/21 11:33 PM  Result Value Ref Range   Glucose-Capillary 229 (H) 70 - 99 mg/dL  Glucose, capillary     Status: Abnormal   Collection Time: 01/25/21  3:35 AM  Result Value Ref Range   Glucose-Capillary 222 (H) 70 - 99 mg/dL  CBC     Status: Abnormal   Collection Time: 01/25/21  4:02 AM  Result Value Ref Range   WBC 13.5 (H) 4.0 - 10.5 K/uL   RBC 3.07 (L) 4.22 - 5.81 MIL/uL   Hemoglobin 8.1 (L) 13.0 - 17.0 g/dL   HCT 26.1 (L) 39.0 - 52.0 %   MCV 85.0 80.0 - 100.0 fL   MCH 26.4 26.0 - 34.0 pg   MCHC 31.0 30.0 - 36.0 g/dL   RDW 18.7 (H) 11.5 - 15.5 %   Platelets 191 150 - 400 K/uL   nRBC 0.0 0.0 - 0.2 %  APTT     Status: Abnormal   Collection Time: 01/25/21  4:02 AM  Result Value Ref Range   aPTT 52 (H) 24 - 36 seconds  Magnesium     Status: Abnormal   Collection Time: 01/25/21  4:02 AM  Result Value Ref Range   Magnesium 2.7 (H) 1.7 - 2.4 mg/dL    Assessment & Plan: Present on Admission: **None**    LOS: 28 days   Additional comments:I reviewed the patient's new clinical lab test results. . Fall down stairs 8/12   VDRF - guaifenisen, wean, S/P trach 8/29 by Dr. Bobbye Morton. Did HTC 4H yesterday, continue as able ID - off abx, no fevers, completed maxipime/ampicillin 8/31, CT A/P with ascending colitis and distention. Stool studies and C. dif are all negative, has foul  secretions so resp CX sent 9/8 TBI/SAH/SDH - NSGY c/s, Dr. Annette Stable. Significant frontal lobe injuries. Keppra x7d for sz ppx. Occipital bone fx - NSGY c/s, Dr. Annette Stable Temporal bone fx extending into middle ear - ENT c/s, Dr. Constance Holster Right TM Rupture - ENT c/s, Dr. Constance Holster AFRVR -  cardene off, hydral 25q8.   ABL anemia - stable Bilateral pulmonary embolism - bivalirudin AKI - CRRT per Renal. Tolerating fluid removal. Starting to make some urine.  Hx DM2 - resistant SSI, novolog q4, glargine  50u BID Hx HTN - PRN meds FEN - NPO, TF, Klonopin, dex now off VTE - SCDs, bival gtt Dispo - ICU, CRRT, HTC as able Critical Care Total Time*: 36 Minutes  Georganna Skeans, MD, MPH, FACS Trauma & General Surgery Use [email or web address] to contact on call provider  01/25/2021  *Care during the described time interval was provided by me. I have reviewed this patient's available data, including medical history, events of note, physical examination and test results as part of my evaluation.

## 2021-01-25 NOTE — Progress Notes (Signed)
[name] NEPHROLOGY PROGRESS NOTE  Assessment/ Plan:  #Acute kidney injury, oliguric: Multifactorial etiology including ischemic ATN in the setting of hypotension, sepsis complicated by contrast injury.  Started CRRT on 9/1 and has been tolerating well.  No heparin as he is on bivalirudin.  The HD catheter was changed on 9/7.  He is tolerating CRRT well with optimization of volume and electrolytes.  Hemodynamically stable.  I am planning to discontinue CRRT today and assess for intermittent HD tomorrow.  Recommend strict ins and outs, daily labs and close monitoring.  Discussed with the nurse.  #Fall/bilateral subarachnoid hemorrhage/SDH, TBI/occipital and temporal bone fracture: Per trauma team.  #Acute respiratory failure: Status post trach on 8/29 and on vent.  #A. fib with RVR: On amiodarone, Angiomax.  # Anemia of critical illness: Transfuse as needed.  #Metabolic acidosis: Managed with dialysis.  Discontinued oral sodium bicarbonate.  #Bilateral pulm embolism: Currently on anticoagulation.  Subjective: Seen and examined.  Opening eyes.  Around 50 cc of urine overnight per nurse.  Blood pressure acceptable.  No new event. Objective Vital signs in last 24 hours: Vitals:   01/25/21 0700 01/25/21 0725 01/25/21 0800 01/25/21 0900  BP: 130/67 130/67 135/70 136/73  Pulse: 75 69 77 75  Resp: 20 18 17 18   Temp:   (!) 97.5 F (36.4 C)   TempSrc:   Oral   SpO2: 100% 100% 100% 100%  Weight:      Height:       Weight change:   Intake/Output Summary (Last 24 hours) at 01/25/2021 0949 Last data filed at 01/25/2021 0900 Gross per 24 hour  Intake 2175.62 ml  Output 5676 ml  Net -3500.38 ml        Labs: Basic Metabolic Panel: Recent Labs  Lab 01/24/21 0357 01/24/21 0643 01/24/21 1635  NA 134* 135 134*  K 4.6 4.5 4.8  CL 101 103 96*  CO2 24 24 25   GLUCOSE 146* 105* 132*  BUN 61* 58* 57*  CREATININE 1.87* 1.77* 1.77*  CALCIUM 8.7* 8.3* 8.9  PHOS 4.3 3.8  4.1    Liver Function Tests: Recent Labs  Lab 01/24/21 0357 01/24/21 0643 01/24/21 1635  ALBUMIN 2.3* 2.1* 2.6*    No results for input(s): LIPASE, AMYLASE in the last 168 hours. No results for input(s): AMMONIA in the last 168 hours. CBC: Recent Labs  Lab 01/21/21 0514 01/22/21 0500 01/23/21 0412 01/24/21 0357 01/25/21 0402  WBC 13.5* 10.3 11.2* 9.6 13.5*  HGB 7.9* 7.4* 7.5* 7.5* 8.1*  HCT 25.9* 24.5* 24.1* 24.4* 26.1*  MCV 85.2 86.6 86.1 85.9 85.0  PLT 387 262 205 182 191    Cardiac Enzymes: No results for input(s): CKTOTAL, CKMB, CKMBINDEX, TROPONINI in the last 168 hours. CBG: Recent Labs  Lab 01/24/21 1611 01/24/21 1956 01/24/21 2333 01/25/21 0335 01/25/21 0813  GLUCAP 119* 167* 229* 222* 211*     Iron Studies: No results for input(s): IRON, TIBC, TRANSFERRIN, FERRITIN in the last 72 hours. Studies/Results: DG CHEST PORT 1 VIEW  Result Date: 01/23/2021 CLINICAL DATA:  Central line placement EXAM: PORTABLE CHEST 1 VIEW COMPARISON:  Same day radiograph FINDINGS: Unchanged cardiomediastinal silhouette. Unchanged tracheostomy tube, right upper extremity PICC, and partially imaged enteric feeding tube. Persistent bilateral effusions and diffuse bilateral interstitial opacities. No visible pneumothorax. Bones are unchanged. The left approach central venous catheter tip overlies the distal superior vena cava as clinically questioned. IMPRESSION: Left upper central venous catheter tip overlies the distal superior vena cava. Unchanged pulmonary edema  and layering bilateral pleural effusions. Electronically Signed   By: Maurine Simmering M.D.   On: 01/23/2021 13:02    Medications: Infusions:  sodium chloride     bivalirudin (ANGIOMAX) infusion 0.5 mg/mL (Non-ACS indications) 0.02 mg/kg/hr (01/25/21 0900)   dexmedetomidine (PRECEDEX) IV infusion Stopped (01/24/21 0651)   feeding supplement (PIVOT 1.5 CAL) 1,000 mL (01/24/21 1105)   niCARDipine Stopped (01/22/21 2147)     Scheduled Medications:  acetaminophen  1,000 mg Per Tube Q6H   amiodarone  200 mg Per Tube BID   Followed by   Derrill Memo ON 01/31/2021] amiodarone  200 mg Per Tube Daily   chlorhexidine gluconate (MEDLINE KIT)  15 mL Mouth Rinse BID   Chlorhexidine Gluconate Cloth  6 each Topical Q0600   clonazePAM  0.5 mg Per Tube BID   docusate  100 mg Per Tube BID   feeding supplement (PROSource TF)  90 mL Per Tube BID   guaiFENesin  10 mL Per Tube Q4H   hydrALAZINE  25 mg Per Tube Q8H   insulin aspart  0-20 Units Subcutaneous Q4H   insulin aspart  10 Units Subcutaneous Q4H   insulin glargine-yfgn  50 Units Subcutaneous BID   mouth rinse  15 mL Mouth Rinse 10 times per day   methocarbamol  1,000 mg Per Tube Q8H   pantoprazole sodium  40 mg Per Tube Daily   polyethylene glycol  17 g Per Tube Daily   QUEtiapine  100 mg Per Tube BID   senna  1 tablet Per Tube Daily   sodium chloride flush  10-40 mL Intracatheter Q12H    have reviewed scheduled and prn medications.  Physical Exam: General: Lying on bed, tracheostomy and on vent.  Not in distress.  Heart: RRR, s1s2 nl Lungs: Coarse breath sound bilateral. Abdomen:soft, nontender. Extremities: Only trace dependent edema. Neurology:Alert awake and following commands Dialysis Access: Left subclavian temporary HD catheter placed on 9/7.  Angel [name] 01/25/2021,9:49 AM  LOS: 28 days

## 2021-01-25 NOTE — Progress Notes (Signed)
Verbal order from [facility] MD to stop CRRT once prismasol 1523ml/hr bag finishes.  CRRT stopped at 0930, 136ml blood returned to patient prior to disconnecting.

## 2021-01-25 NOTE — Progress Notes (Addendum)
Progress Note  Patient Name: Angel [name] Date of Encounter: 01/25/2021  [facility] Cardiologist: Werner Lean, MD   Subjective   SR overnight without issues. Alert.  Tolerated trach collar for 4 hours.  Steadily improving.  Inpatient Medications    Scheduled Meds:  acetaminophen  1,000 mg Per Tube Q6H   amiodarone  200 mg Per Tube BID   Followed by   Derrill Memo ON 01/31/2021] amiodarone  200 mg Per Tube Daily   chlorhexidine gluconate (MEDLINE KIT)  15 mL Mouth Rinse BID   Chlorhexidine Gluconate Cloth  6 each Topical Q0600   clonazePAM  0.5 mg Per Tube BID   docusate  100 mg Per Tube BID   feeding supplement (PROSource TF)  90 mL Per Tube BID   guaiFENesin  10 mL Per Tube Q4H   hydrALAZINE  25 mg Per Tube Q8H   insulin aspart  0-20 Units Subcutaneous Q4H   insulin aspart  10 Units Subcutaneous Q4H   insulin glargine-yfgn  50 Units Subcutaneous BID   mouth rinse  15 mL Mouth Rinse 10 times per day   methocarbamol  1,000 mg Per Tube Q8H   pantoprazole sodium  40 mg Per Tube Daily   polyethylene glycol  17 g Per Tube Daily   QUEtiapine  100 mg Per Tube BID   senna  1 tablet Per Tube Daily   sodium chloride flush  10-40 mL Intracatheter Q12H   Continuous Infusions:  sodium chloride     bivalirudin (ANGIOMAX) infusion 0.5 mg/mL (Non-ACS indications) 0.02 mg/kg/hr (01/25/21 1100)   feeding supplement (PIVOT 1.5 CAL) 1,000 mL (01/25/21 0700)   PRN Meds: Place/Maintain arterial line **AND** sodium chloride, artificial tears, heparin, hydrALAZINE, HYDROmorphone (DILAUDID) injection, midazolam, ondansetron **OR** ondansetron (ZOFRAN) IV, oxyCODONE, sodium chloride flush   Vital Signs    Vitals:   01/25/21 1000 01/25/21 1030 01/25/21 1100 01/25/21 1120  BP: 136/70 (!) 131/92 (!) 157/79 (!) 157/79  Pulse: 64 69 72 70  Resp: 15 20 (!) 21 17  Temp:      TempSrc:      SpO2: 100% 100% 100% 100%  Weight:      Height:        Intake/Output Summary (Last 24 hours) at  01/25/2021 1157 Last data filed at 01/25/2021 1100 Gross per 24 hour  Intake 2112.42 ml  Output 5207 ml  Net -3094.58 ml   Last 3 Weights 01/24/2021 01/23/2021 01/22/2021  Weight (lbs) 259 lb 14.8 oz 266 lb 1.5 oz 276 lb 0.3 oz  Weight (kg) 117.9 kg 120.7 kg 125.2 kg      Telemetry    SR- Personally Reviewed  ECG    No new tracings - Personally Reviewed  Physical Exam   GEN: Ill appearing gentleman with trach tube in place Neck: unable to assess JVD; Trach Cardiac: RRR, no murmurs, rubs, or gallops.  Respiratory: Coarse breath sounds bilaterally anteriorly GI: Soft, nontender, non-distended  MS: No edema; No deformity. Neuro:  Calm, tracks movement with eyes, nods appropriately, moving extremities  Labs    High Sensitivity Troponin:   Recent Labs  Lab 01/10/21 1346 01/10/21 1518  TROPONINIHS 52* 56*      Chemistry Recent Labs  Lab 01/24/21 0643 01/24/21 1635 01/25/21 0950  NA 135 134* 133*  K 4.5 4.8 4.8  CL 103 96* 96*  CO2 _0 GLUCOSE 105* 132* 219*  BUN 58* 57* 53*  CREATININE 1.77* 1.77* 1.84*  CALCIUM 8.3* 8.9  9.0  ALBUMIN 2.1* 2.6* 2.5*  GFRNONAA 41* 41* 39*  ANIONGAP _0 Hematology Recent Labs  Lab 01/23/21 0412 01/24/21 0357 01/25/21 0402  WBC 11.2* 9.6 13.5*  RBC 2.80* 2.84* 3.07*  HGB 7.5* 7.5* 8.1*  HCT 24.1* 24.4* 26.1*  MCV 86.1 85.9 85.0  MCH 26.8 26.4 26.4  MCHC 31.1 30.7 31.0  RDW 19.1* 18.8* 18.7*  PLT 205 182 191    BNPNo results for input(s): BNP, PROBNP in the last 168 hours.   DDimer No results for input(s): DDIMER in the last 168 hours.   Radiology    DG CHEST PORT 1 VIEW  Result Date: 01/23/2021 CLINICAL DATA:  Central line placement EXAM: PORTABLE CHEST 1 VIEW COMPARISON:  Same day radiograph FINDINGS: Unchanged cardiomediastinal silhouette. Unchanged tracheostomy tube, right upper extremity PICC, and partially imaged enteric feeding tube. Persistent bilateral effusions and diffuse bilateral interstitial  opacities. No visible pneumothorax. Bones are unchanged. The left approach central venous catheter tip overlies the distal superior vena cava as clinically questioned. IMPRESSION: Left upper central venous catheter tip overlies the distal superior vena cava. Unchanged pulmonary edema and layering bilateral pleural effusions. Electronically Signed   By: Maurine Simmering M.D.   On: 01/23/2021 13:02    Cardiac Studies     Echo 01/07/21: 1. Left ventricular ejection fraction, by estimation, is 60 to 65%. The  left ventricle has normal function. The left ventricle has no regional  wall motion abnormalities. There is mild left ventricular hypertrophy.  Left ventricular diastolic parameters  are indeterminate.   2. Right ventricule is poorly visualized but grossly normal size and  systolic function   3. Left atrial size was mildly dilated.   4. Right atrial size was mildly dilated.   5. The mitral valve is normal in structure. No evidence of mitral valve  regurgitation. No evidence of mitral stenosis.   6. The aortic valve was not well visualized. Aortic valve regurgitation  is not visualized. No aortic stenosis is present.   CTPE: Date: 01/08/21 Results: A. Bilateral PE B. 3V CAC and Aortic Atherosclerosis C. Multifocal lung consolidation D. Bilateral pleural effusion  Patient Profile     70 y.o. male with a PMH of hyperlipidemia, DM type II, who presented with fall resulting in subarachnoid hemorrhage requiring intubation with hospital course complicated by Multi lobar pneumonia, PE, sepsis, AKI requiring CRRT, and new onset atrial fibrillation/flutter for which cardiology is following.  Assessment & Plan    1. New onset paroxysmal atrial flutter, DVT, PE: patient has been in persistent atrial flutter with variable AV block in the setting of a subarachnoid hemorrhage. He was started on bivalirudin for anticoagulation for stroke ppx, in addition to findings of bilateral PE.  He was on a cardene  gtt which was stopped 01/22/21. He has maintained sinus rhythm since 01/22/21 with intermittent bradycardia overnight. BP stabilized and he is off precedex.   - po amiodarone 247m BID x7 days, then 2062mdaily (ordered tube transition was 01/24/21) - Continue bivalirudin for stroke and PE ppx for now; discussing with primary and nephrology; no planned surgery and will work to being transition to coumadin vs eliquis.  2. Hypoxic respiratory failure: hospital course complicated by multilobar PNA and bilateral PE. He was initially intubated and underwent trach placement 01/14/21. He completed a course of IV antibiotics for PNA. He remains on bivalirudin for management of his PE.  - Continue management per primary team  3. AKI: Cr peaked  at 5.5 this admission. Improved on CRRT. Cr down to 1.77 today; baseline 0.9 - Continue CRRT per nephrology  4. Coronary artery calcifications: noted on CT this admission.  - Risk factor modifications to be addressed following recovery from acute illness(es)   Remainder of care per primary team: - TBI/SAH/SDH - Occipital bone/temporal bone fx extending into middle ear - Anemia - Colitis  Likely will follow peripherally over the weekend; please call with questions/concerns     For questions or updates, please contact [facility] Please consult [email or web address] for contact info under        Signed, Werner Lean, MD  01/25/2021, 11:57 AM    Discussed with nephrology and Trauma: will delay transition to oral AC (coumadin) until patient declares if he will need long term HD.  He would need tunneled line if that was the case.  Continue IV AC.  Rudean Haskell, MD [place], [address] [place], [place] [phone]  12:35 PM

## 2021-01-25 NOTE — Progress Notes (Signed)
[place] for bivalirudin Indication: atrial fibrillation, DVT, PE 8/23   No Known Allergies  Patient Measurements: Height: 6\' 2"  (188 cm) Weight: 117.9 kg (259 lb 14.8 oz) IBW/kg (Calculated) : 82.2 Heparin Dosing Weight: 107kg  Vital Signs: Temp: 98.2 F (36.8 C) (09/09 0400) Temp Source: Axillary (09/09 0400) BP: 130/67 (09/09 0700) Pulse Rate: 75 (09/09 0700)  Labs: Recent Labs    01/23/21 0412 01/23/21 1544 01/24/21 0357 01/24/21 0643 01/24/21 1635 01/25/21 0402  HGB 7.5*  --  7.5*  --   --  8.1*  HCT 24.1*  --  24.4*  --   --  26.1*  PLT 205  --  182  --   --  191  APTT 57*  --  57*  --   --  52*  CREATININE 1.82*   < > 1.87* 1.77* 1.77*  --    < > = values in this interval not displayed.     Estimated Creatinine Clearance: 53 mL/min (A) (by C-G formula based on SCr of 1.77 mg/dL (H)).   Assessment: 86 YOM presenting s/p fall with TBI/SAH and facial fx, in afib started on amiodarone and now cleared per trauma for full dose anticoagulation. 8/23 patient found to have small acute bilateral PE and age-indeterminate LUE DVT. Pharmacy consulted to dose bivalirudin per Trauma.  Given recent head bleed, will aim for middle of therapeutic range aptt and watch closely for signs and symptoms of bleeding.   aPTT this morning remains therapeutic (aPTT 52, goal of 50-65), H/H stable - no active bleeding noted. Plt wnl  Goal of Therapy:  Aptt goal ~50-65s per discussion with Trauma  Monitor platelets by anticoagulation protocol: Yes   Plan:  - Continue Bivalirudin at 0.02 mg/kg/hr - Daily aPTT monitoring - Will continue to monitor for any signs/symptoms of bleeding  Thank you for allowing pharmacy to be a part of this patient's care.  Alycia Rossetti, PharmD, BCPS Clinical Pharmacist Clinical phone for 01/25/2021: [phone] 01/25/2021 7:06 AM   **Pharmacist phone directory can now be found on [email or web address]).  Listed under [place].

## 2021-01-25 NOTE — Progress Notes (Signed)
Occupational Therapy Treatment Patient Details Name: Angel [name] MRN: [id number] DOB: [birth year] Today's Date: 01/25/2021    History of present illness Angel [name] is a 70 y.o. 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 CT showed R temporal and parietal SAH, SAH anterior frontal lobes  bilaterally. Also sustained right occipital skull fracture, temporal bone fx, right TM rupture, bilateral PE. Intubated 8/12, trach'd 8/29. CRRT 9/1- 9/9.  PMH: DM2, HTN   OT comments  Pt seen on CPAP 40% FiO2 ; 5 Peep. Lethargic during session. When alert, able to follow commands with minimal delay in processing. Tolerated at least 15 minutes in full upright chair position with VSS. Total A +2 for all bed mobility and total A for all ADL tasks. Pt will most likely need [facility] at [facility]. Recommend pt be placed in chair position at least TID. Pt may be appropriate for Tilt bed soon. Will continue to follow acutely.   Follow Up Recommendations  LTACH    Equipment Recommendations  3 in 1 bedside commode;Wheelchair (measurements OT);Wheelchair cushion (measurements OT);Hospital bed    Recommendations for Other Services      Precautions / Restrictions Precautions Precautions: Fall Precaution Comments: cortrak, trach vent, primofit, flexiseal       Mobility Bed Mobility Overal bed mobility: Needs Assistance             General bed mobility comments: chair position; attempting to have pt complete ant/posterior lean with +2 total A; Able to maintain head control however no initiation of movement; total A +2 with any movement    Transfers                 General transfer comment: bed level only    Balance Overall balance assessment: Needs assistance   Sitting balance-Leahy Scale: Zero                                     ADL either performed or assessed with clinical judgement   ADL                                         General ADL Comments:  total A     Vision  Visually attending to therapist; will further assess     Perception     Praxis      Cognition Arousal/Alertness: Lethargic Behavior During Therapy: Flat affect;Restless Overall Cognitive Status: Difficult to assess                 Rancho Levels of Cognitive Functioning Rancho Los Amigos Scales of Cognitive Functioning:  (difficult to assess)               General Comments: Following 1 step commands when alert; nsg reports pt did not sleep well last night        Exercises General Exercises - Upper Extremity Shoulder Flexion: PROM;Both;10 reps;AAROM Elbow Flexion: PROM;AAROM;Both;10 reps Elbow Extension: PROM;AAROM;Both;10 reps   Shoulder Instructions       General Comments tolerated full chair position at lest 15 min    Pertinent Vitals/ Pain       Pain Assessment: Faces Faces Pain Scale: Hurts little more Pain Location: generalized Pain Descriptors / Indicators: Discomfort;Other (Comment) Pain Intervention(s): Limited activity within patient's tolerance  Home Living  Prior Functioning/Environment              Frequency  Min 2X/week        Progress Toward Goals  OT Goals(current goals can now be found in the care plan section)  Progress towards OT goals: Not progressing toward goals - comment (lethargic)  Acute Rehab OT Goals Patient Stated Goal: unable to state OT Goal Formulation: Patient unable to participate in goal setting Time For Goal Achievement: 01/30/21 Potential to Achieve Goals: Fair ADL Goals Additional ADL Goal #1: pt will follow 1 step command 25% of session Additional ADL Goal #2: pt will visually track therapist in room for 25% of session Additional ADL Goal #3: pt will tolerate static sitting max (A) for 10 minutes ( stable VSS)  Plan Discharge plan needs to be updated    Co-evaluation    PT/OT/SLP Co-Evaluation/Treatment: Yes Reason  for Co-Treatment: Complexity of the patient's impairments (multi-system involvement);For patient/therapist safety   OT goals addressed during session: ADL's and self-care      AM-PAC OT "6 Clicks" Daily Activity     Outcome Measure   Help from another person eating meals?: Total Help from another person taking care of personal grooming?: Total Help from another person toileting, which includes using toliet, bedpan, or urinal?: Total Help from another person bathing (including washing, rinsing, drying)?: Total Help from another person to put on and taking off regular upper body clothing?: Total Help from another person to put on and taking off regular lower body clothing?: Total 6 Click Score: 6    End of Session    OT Visit Diagnosis: Unsteadiness on feet (R26.81);Other abnormalities of gait and mobility (R26.89);Muscle weakness (generalized) (M62.81);Other symptoms and signs involving cognitive function;Pain Pain - part of body:  (generalized)   Activity Tolerance Patient limited by lethargy;Patient limited by fatigue   Patient Left in bed;with call bell/phone within reach;with bed alarm set;Other (comment) (modified chair position)   Nurse Communication Mobility status        Time: 4782-9562 OT Time Calculation (min): 34 min  Charges: OT General Charges $OT Visit: 1 Visit OT Treatments $Therapeutic Activity: 8-22 mins  Maurie Boettcher, OT/L   Acute OT Clinical Specialist [place] Pager [phone] Office [phone]    [facility] 01/25/2021, 3:22 PM

## 2021-01-25 NOTE — Progress Notes (Signed)
Patient ID: Angel [name], male   DOB: Oct 04, 1950, 70 y.o.   MRN: [id number] I spoke with his wife and daughter at the bedside.  Georganna Skeans, MD, MPH, FACS Please use [email or web address] to contact on call provider

## 2021-01-25 NOTE — Progress Notes (Signed)
Physical Therapy Treatment Patient Details Name: Angel [name] MRN: [id number] DOB: [birth year] Today's Date: 01/25/2021    History of Present Illness Angel [name] is a 70 y.o. 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 CT showed R temporal and parietal SAH, SAH anterior frontal lobes  bilaterally. Also sustained right occipital skull fracture, temporal bone fx, right TM rupture, bilateral PE. Intubated 8/12, trach'd 8/29. CRRT 9/1- 9/9.  PMH: DM2, HTN    PT Comments    Patient seen in conjunction with OT to maximize patient's activity tolerance. Patient currently on CPAP setting with 40% FiO2; 5 PEEP. Patient lethargic throughout session. When patient alert, he following simple commands with delay. Placed patient in egress position in bed x 15 minutes with VSS. Patient requires totalA+2 to pull forward away from bed and able to maintain head control but does not initiate movement. Updated recommendation to [facility] to continue progressing patient based on tolerance. Will continue to assess each session.      Follow Up Recommendations  LTACH     Equipment Recommendations  Other (comment) (TBD)    Recommendations for Other Services       Precautions / Restrictions Precautions Precautions: Fall Precaution Comments: cortrak, trach vent, primofit, flexiseal Restrictions Weight Bearing Restrictions: No    Mobility  Bed Mobility Overal bed mobility: Needs Assistance             General bed mobility comments: chair position; attempting to have pt complete ant/posterior lean with +2 total A; Able to maintain head control however no initiation of movement    Transfers                 General transfer comment: bed level only  Ambulation/Gait                 Stairs             Wheelchair Mobility    Modified Rankin (Stroke Patients Only)       Balance Overall balance assessment: Needs assistance   Sitting balance-Leahy Scale: Zero                                       Cognition Arousal/Alertness: Lethargic Behavior During Therapy: Flat affect;Restless Overall Cognitive Status: Difficult to assess                 Rancho Levels of Cognitive Functioning Rancho Los Amigos Scales of Cognitive Functioning:  (difficult to assess)               General Comments: Following 1 step commands when alert; nsg reports pt did not sleep well last night      Exercises General Exercises - Upper Extremity Shoulder Flexion: PROM;Both;10 reps;AAROM Elbow Flexion: PROM;AAROM;Both;10 reps Elbow Extension: PROM;AAROM;Both;10 reps General Exercises - Lower Extremity Ankle Circles/Pumps: PROM;AAROM;Both;5 reps;Seated Long Arc Quad: PROM;Both;5 reps;Seated    General Comments General comments (skin integrity, edema, etc.): tolerated full chair position at least 15 min      Pertinent Vitals/Pain Pain Assessment: Faces Faces Pain Scale: Hurts little more Pain Location: generalized Pain Descriptors / Indicators: Discomfort;Other (Comment) Pain Intervention(s): Limited activity within patient's tolerance    Home Living                      Prior Function            PT Goals (current  goals can now be found in the care plan section) Acute Rehab PT Goals Patient Stated Goal: unable to state PT Goal Formulation: With patient/family Time For Goal Achievement: 02/07/21 Potential to Achieve Goals: Fair Progress towards PT goals: Progressing toward goals    Frequency    Min 3X/week      PT Plan Discharge plan needs to be updated    Co-evaluation PT/OT/SLP Co-Evaluation/Treatment: Yes Reason for Co-Treatment: Complexity of the patient's impairments (multi-system involvement);For patient/therapist safety PT goals addressed during session: Mobility/safety with mobility OT goals addressed during session: ADL's and self-care      AM-PAC PT "6 Clicks" Mobility   Outcome Measure  Help needed  turning from your back to your side while in a flat bed without using bedrails?: Total Help needed moving from lying on your back to sitting on the side of a flat bed without using bedrails?: Total Help needed moving to and from a bed to a chair (including a wheelchair)?: Total Help needed standing up from a chair using your arms (e.g., wheelchair or bedside chair)?: Total Help needed to walk in hospital room?: Total Help needed climbing 3-5 steps with a railing? : Total 6 Click Score: 6    End of Session Equipment Utilized During Treatment: Oxygen (vent) Activity Tolerance: Patient limited by fatigue Patient left: in bed;with call bell/phone within reach Nurse Communication: Mobility status PT Visit Diagnosis: Unsteadiness on feet (R26.81);Muscle weakness (generalized) (M62.81);Difficulty in walking, not elsewhere classified (R26.2)     Time: 6484-7207 PT Time Calculation (min) (ACUTE ONLY): 34 min  Charges:  $Therapeutic Activity: 8-22 mins                     Azelie Noguera A. Gilford Rile PT, DPT Acute Rehabilitation Services Pager [phone] Office [phone]    Linna Hoff 01/25/2021, 5:09 PM

## 2021-01-26 ENCOUNTER — Encounter (HOSPITAL_COMMUNITY): Payer: Self-pay

## 2021-01-26 ENCOUNTER — Other Ambulatory Visit: Payer: Self-pay

## 2021-01-26 DIAGNOSIS — I480 Paroxysmal atrial fibrillation: Secondary | ICD-10-CM | POA: Diagnosis not present

## 2021-01-26 LAB — GLUCOSE, CAPILLARY
Glucose-Capillary: 49 mg/dL — ABNORMAL LOW (ref 70–99)
Glucose-Capillary: 143 mg/dL — ABNORMAL HIGH (ref 70–99)
Glucose-Capillary: 167 mg/dL — ABNORMAL HIGH (ref 70–99)
Glucose-Capillary: 207 mg/dL — ABNORMAL HIGH (ref 70–99)
Glucose-Capillary: 185 mg/dL — ABNORMAL HIGH (ref 70–99)
Glucose-Capillary: 198 mg/dL — ABNORMAL HIGH (ref 70–99)
Glucose-Capillary: 219 mg/dL — ABNORMAL HIGH (ref 70–99)

## 2021-01-26 LAB — CBC
HCT: 24.8 % — ABNORMAL LOW (ref 39.0–52.0)
Hemoglobin: 7.9 g/dL — ABNORMAL LOW (ref 13.0–17.0)
MCH: 26.9 pg (ref 26.0–34.0)
MCHC: 31.9 g/dL (ref 30.0–36.0)
MCV: 84.4 fL (ref 80.0–100.0)
Platelets: 186 10*3/uL (ref 150–400)
RBC: 2.94 MIL/uL — ABNORMAL LOW (ref 4.22–5.81)
RDW: 18.9 % — ABNORMAL HIGH (ref 11.5–15.5)
WBC: 14.4 10*3/uL — ABNORMAL HIGH (ref 4.0–10.5)
nRBC: 0 % (ref 0.0–0.2)

## 2021-01-26 LAB — RENAL FUNCTION PANEL
Albumin: 2.4 g/dL — ABNORMAL LOW (ref 3.5–5.0)
Anion gap: 14 (ref 5–15)
BUN: 98 mg/dL — ABNORMAL HIGH (ref 8–23)
CO2: 21 mmol/L — ABNORMAL LOW (ref 22–32)
Calcium: 9.6 mg/dL (ref 8.9–10.3)
Chloride: 99 mmol/L (ref 98–111)
Creatinine, Ser: 3.67 mg/dL — ABNORMAL HIGH (ref 0.61–1.24)
GFR, Estimated: 17 mL/min — ABNORMAL LOW (ref >60)
Glucose, Bld: 91 mg/dL (ref 70–99)
Phosphorus: 6.3 mg/dL — ABNORMAL HIGH (ref 2.5–4.6)
Potassium: 6.2 mmol/L — ABNORMAL HIGH (ref 3.5–5.1)
Sodium: 134 mmol/L — ABNORMAL LOW (ref 135–145)

## 2021-01-26 LAB — MAGNESIUM: Magnesium: 3.1 mg/dL — ABNORMAL HIGH (ref 1.7–2.4)

## 2021-01-26 LAB — APTT
aPTT: 69 s — ABNORMAL HIGH (ref 24–36)
aPTT: 62 s — ABNORMAL HIGH (ref 24–36)

## 2021-01-26 MED ORDER — CHLORHEXIDINE GLUCONATE CLOTH 2 % EX PADS
6.0000 | MEDICATED_PAD | Freq: Every day | CUTANEOUS | Status: DC
  Administered 2021-01-26: 6 via TOPICAL

## 2021-01-26 MED ORDER — DEXTROSE 50 % IV SOLN: 1.0000 | Freq: Once | INTRAVENOUS | Status: AC

## 2021-01-26 MED ORDER — DEXTROSE 50 % IV SOLN
INTRAVENOUS | Status: AC
  Administered 2021-01-26: 50 mL via INTRAVENOUS
  Filled 2021-01-26: qty 50

## 2021-01-26 MED ORDER — SODIUM CHLORIDE 0.9 % IV SOLN
0.0220 mg/kg/h | INTRAVENOUS | Status: DC
  Administered 2021-01-26: 0.018 mg/kg/h via INTRAVENOUS
  Administered 2021-01-27 – 2021-01-28 (×2): 0.02 mg/kg/h via INTRAVENOUS
  Administered 2021-01-29 – 2021-02-01 (×2): 0.022 mg/kg/h via INTRAVENOUS
  Filled 2021-01-26 (×5): qty 250

## 2021-01-26 NOTE — Progress Notes (Signed)
Pt due to start intermittent dialysis today. Pt increasing lethargic as the day progressed, able to follow commands however, very delayed. This RN spoke hemodialysis RN who stated dialysis would not take place until after shift change. MD Schertz made aware of Pt's condition at this time. Will continue to monitor.

## 2021-01-26 NOTE — Progress Notes (Signed)
[place] for bivalirudin Indication: atrial fibrillation, DVT, PE 8/23   No Known Allergies  Patient Measurements: Height: 6\' 2"  (188 cm) Weight: 117.9 kg (259 lb 14.8 oz) IBW/kg (Calculated) : 82.2 Heparin Dosing Weight: 107kg  Vital Signs: Temp: 101.5 F (38.6 C) (09/10 0800) Temp Source: Oral (09/10 0400) BP: 155/74 (09/10 1000) Pulse Rate: 90 (09/10 1000)  Labs: Recent Labs    01/24/21 0357 01/24/21 0643 01/24/21 1635 01/25/21 0402 01/25/21 0950 01/26/21 0452  HGB 7.5*  --   --  8.1*  --  7.9*  HCT 24.4*  --   --  26.1*  --  24.8*  PLT 182  --   --  191  --  186  APTT 57*  --   --  52*  --  69*  CREATININE 1.87*   < > 1.77*  --  1.84* 3.67*   < > = values in this interval not displayed.     Estimated Creatinine Clearance: 25.6 mL/min (A) (by C-G formula based on SCr of 3.67 mg/dL (H)).   Assessment: 65 YOM presenting s/p fall with TBI/SAH and facial fx, in afib started on amiodarone and now cleared per trauma for full dose anticoagulation. 8/23 patient found to have small acute bilateral PE and age-indeterminate LUE DVT. Pharmacy consulted to dose bivalirudin per Trauma.  Given recent head bleed, will aim for middle of therapeutic range aptt and watch closely for signs and symptoms of bleeding.   aPTT this morning now slightly supratherapeutic (aPTT 69, goal of 50-65), CBC stable. CRRT stopped yesterday and transition to IHD - planned session later today. No bleeding or issues with infusion per discussion with RN.  Goal of Therapy:  Aptt goal ~50-65s per discussion with Trauma  Monitor platelets by anticoagulation protocol: Yes   Plan:  Reduce bivalirudin by slightly (by ~10%) to 0.018 mg/kg/hr Check 4hr aPTT Monitor daily CBC, s/sx bleeding   Arturo Morton, PharmD, BCPS Please check AMION for all [place] contact numbers Clinical Pharmacist 01/26/2021 10:31 AM

## 2021-01-26 NOTE — Progress Notes (Signed)
Pt placed on PSV 10/5 per wean protocol. Pt is tolerating well at this time. RT to continue to monitor.

## 2021-01-26 NOTE — Progress Notes (Signed)
Progress Note  Patient Name: Angel [name] Date of Encounter: 01/26/2021  Primary Cardiologist:   Werner Lean, MD   Subjective   Trached.  Somnolent. Difficult to arouse  Inpatient Medications    Scheduled Meds:  acetaminophen  1,000 mg Per Tube Q6H   amiodarone  200 mg Per Tube BID   Followed by   Derrill Memo ON 01/31/2021] amiodarone  200 mg Per Tube Daily   chlorhexidine gluconate (MEDLINE KIT)  15 mL Mouth Rinse BID   Chlorhexidine Gluconate Cloth  6 each Topical Q0600   Chlorhexidine Gluconate Cloth  6 each Topical Q0600   clonazePAM  0.5 mg Per Tube BID   docusate  100 mg Per Tube BID   feeding supplement (PROSource TF)  90 mL Per Tube BID   guaiFENesin  10 mL Per Tube Q4H   hydrALAZINE  25 mg Per Tube Q8H   insulin aspart  0-20 Units Subcutaneous Q4H   insulin aspart  10 Units Subcutaneous Q4H   insulin glargine-yfgn  50 Units Subcutaneous BID   mouth rinse  15 mL Mouth Rinse 10 times per day   methocarbamol  1,000 mg Per Tube Q8H   pantoprazole sodium  40 mg Per Tube Daily   polyethylene glycol  17 g Per Tube Daily   QUEtiapine  100 mg Per Tube BID   senna  1 tablet Per Tube Daily   sodium chloride flush  10-40 mL Intracatheter Q12H   Continuous Infusions:  sodium chloride     bivalirudin (ANGIOMAX) infusion 0.5 mg/mL (Non-ACS indications) 0.018 mg/kg/hr (01/26/21 1037)   feeding supplement (PIVOT 1.5 CAL) 1,000 mL (01/25/21 0700)   PRN Meds: Place/Maintain arterial line **AND** sodium chloride, artificial tears, heparin, hydrALAZINE, HYDROmorphone (DILAUDID) injection, midazolam, ondansetron **OR** ondansetron (ZOFRAN) IV, oxyCODONE, sodium chloride flush   Vital Signs    Vitals:   01/26/21 0821 01/26/21 0830 01/26/21 0900 01/26/21 1000  BP:  (!) 157/69 (!) 160/69 (!) 155/74  Pulse: 85 87 90 90  Resp: (!) 26 (!) 27 (!) 28 (!) 28  Temp:      TempSrc:      SpO2: 100% 100% 100% 100%  Weight:      Height:        Intake/Output Summary (Last 24  hours) at 01/26/2021 1043 Last data filed at 01/26/2021 1000 Gross per 24 hour  Intake 1734.43 ml  Output 375 ml  Net 1359.43 ml   Filed Weights   01/22/21 0500 01/23/21 0500 01/24/21 0444  Weight: 125.2 kg 120.7 kg 117.9 kg    Telemetry    NSR - Personally Reviewed  ECG    NA - Personally Reviewed  Physical Exam   GEN: No acute distress.   Neck: No  JVD Cardiac: RRR, no murmurs, rubs, or gallops.  Respiratory:      Decreased breath sounds GI: Soft, nontender, non-distended  MS:    Diffuse mild edema  Labs    Chemistry Recent Labs  Lab 01/24/21 1635 01/25/21 0950 01/26/21 0452  NA 134* 133* 134*  K 4.8 4.8 6.2*  CL 96* 96* 99  CO2 25 25 21*  GLUCOSE 132* 219* 91  BUN 57* 53* 98*  CREATININE 1.77* 1.84* 3.67*  CALCIUM 8.9 9.0 9.6  ALBUMIN 2.6* 2.5* 2.4*  GFRNONAA 41* 39* 17*  ANIONGAP 13 12 14      Hematology Recent Labs  Lab 01/24/21 0357 01/25/21 0402 01/26/21 0452  WBC 9.6 13.5* 14.4*  RBC 2.84* 3.07* 2.94*  HGB  7.5* 8.1* 7.9*  HCT 24.4* 26.1* 24.8*  MCV 85.9 85.0 84.4  MCH 26.4 26.4 26.9  MCHC 30.7 31.0 31.9  RDW 18.8* 18.7* 18.9*  PLT 182 191 186    Cardiac EnzymesNo results for input(s): TROPONINI in the last 168 hours. No results for input(s): TROPIPOC in the last 168 hours.   BNPNo results for input(s): BNP, PROBNP in the last 168 hours.   DDimer No results for input(s): DDIMER in the last 168 hours.   Radiology    No results found.  Cardiac Studies   Echo 01/07/21: 1. Left ventricular ejection fraction, by estimation, is 60 to 65%. The  left ventricle has normal function. The left ventricle has no regional  wall motion abnormalities. There is mild left ventricular hypertrophy.  Left ventricular diastolic parameters  are indeterminate.   2. Right ventricule is poorly visualized but grossly normal size and  systolic function   3. Left atrial size was mildly dilated.   4. Right atrial size was mildly dilated.   5. The mitral  valve is normal in structure. No evidence of mitral valve  regurgitation. No evidence of mitral stenosis.   6. The aortic valve was not well visualized. Aortic valve regurgitation  is not visualized. No aortic stenosis is present.   Patient Profile     70 y.o. male with a PMH of hyperlipidemia, DM type II, who presented with fall resulting in subarachnoid hemorrhage requiring intubation with hospital course complicated by Multi lobar pneumonia, PE, sepsis, AKI requiring CRRT, and new onset atrial fibrillation/flutter for which cardiology is following.  Assessment & Plan    New onset paroxysmal atrial flutter:   Has been maintaining NSR on PO amiodarone now.  Holding off on transition to Eliquis per previous note.  No change in therapy.     Hypoxic respiratory failure: hospital course complicated by multilobar PNA and bilateral PE:  Continue vent management per primary team.   AKI: Cr peaked at 5.5 this admission. Improved on CRRT. Cr down to 1.77.  Now up to 3.67.    Plan is for bedside intermittent HD.   We will follow as needed.     For questions or updates, please contact [place] Please consult [email or web address] for contact info under Cardiology/STEMI.   Signed, Minus Breeding, MD  01/26/2021, 10:43 AM

## 2021-01-26 NOTE — Progress Notes (Signed)
[facility] NEPHROLOGY PROGRESS NOTE  Assessment/ Plan:  #Acute kidney injury, oliguric: Multifactorial etiology including ischemic ATN in the setting of hypotension, sepsis complicated by contrast injury. CRRT from 9/1-9/9.  No heparin as he is on bivalirudin.  The HD catheter was changed on 9/7.   The CRRT held yesterday.  He has around 285 cc of urine output with worsening renal parameters and hyperkalemia.  Plan for intermittent hemodialysis at bedside today.  Continue to monitor strict ins and out, daily lab. Discussed with the nurse.  #Fall/bilateral subarachnoid hemorrhage/SDH, TBI/occipital and temporal bone fracture: Per trauma team.  #Acute respiratory failure: Status post trach on 8/29 and on vent.  #A. fib with RVR: On amiodarone, Angiomax.  # Anemia of critical illness: Transfuse as needed.  #Metabolic acidosis: Managed with dialysis.  Discontinued oral sodium bicarbonate.  #Bilateral pulm embolism: Currently on anticoagulation.  #Acute febrile illness: Per primary team.  #Hyperkalemia: HD today.  Subjective: Seen and examined.  He is alert awake and opening eyes only.  Urine output 285 cc.  No new event. Objective Vital signs in last 24 hours: Vitals:   01/26/21 0800 01/26/21 0821 01/26/21 0830 01/26/21 0900  BP: (!) 164/70  (!) 157/69 (!) 160/69  Pulse: 85 85 87 90  Resp: (!) 22 (!) 26 (!) 27 (!) 28  Temp: (!) 101.5 F (38.6 C)     TempSrc:      SpO2: 100% 100% 100% 100%  Weight:      Height:       Weight change:   Intake/Output Summary (Last 24 hours) at 01/26/2021 0928 Last data filed at 01/26/2021 0900 Gross per 24 hour  Intake 1774.63 ml  Output 275 ml  Net 1499.63 ml        Labs: Basic Metabolic Panel: Recent Labs  Lab 01/24/21 1635 01/25/21 0950 01/26/21 0452  NA 134* 133* 134*  K 4.8 4.8 6.2*  CL 96* 96* 99  CO2 25 25 21*  GLUCOSE 132* 219* 91  BUN 57* 53* 98*  CREATININE 1.77* 1.84* 3.67*  CALCIUM 8.9 9.0 9.6   PHOS 4.1 4.1 6.3*    Liver Function Tests: Recent Labs  Lab 01/24/21 1635 01/25/21 0950 01/26/21 0452  ALBUMIN 2.6* 2.5* 2.4*    No results for input(s): LIPASE, AMYLASE in the last 168 hours. No results for input(s): AMMONIA in the last 168 hours. CBC: Recent Labs  Lab 01/22/21 0500 01/23/21 0412 01/24/21 0357 01/25/21 0402 01/26/21 0452  WBC 10.3 11.2* 9.6 13.5* 14.4*  HGB 7.4* 7.5* 7.5* 8.1* 7.9*  HCT 24.5* 24.1* 24.4* 26.1* 24.8*  MCV 86.6 86.1 85.9 85.0 84.4  PLT 262 205 182 191 186    Cardiac Enzymes: No results for input(s): CKTOTAL, CKMB, CKMBINDEX, TROPONINI in the last 168 hours. CBG: Recent Labs  Lab 01/25/21 1952 01/25/21 2310 01/26/21 0328 01/26/21 0355 01/26/21 0757  GLUCAP 159* 185* 49* 143* 167*     Iron Studies: No results for input(s): IRON, TIBC, TRANSFERRIN, FERRITIN in the last 72 hours. Studies/Results: No results found.  Medications: Infusions:  sodium chloride     bivalirudin (ANGIOMAX) infusion 0.5 mg/mL (Non-ACS indications) 0.02 mg/kg/hr (01/26/21 0900)   feeding supplement (PIVOT 1.5 CAL) 1,000 mL (01/25/21 0700)    Scheduled Medications:  acetaminophen  1,000 mg Per Tube Q6H   amiodarone  200 mg Per Tube BID   Followed by   Derrill Memo ON 01/31/2021] amiodarone  200 mg Per Tube Daily   chlorhexidine gluconate (MEDLINE KIT)  15  mL Mouth Rinse BID   Chlorhexidine Gluconate Cloth  6 each Topical Q0600   Chlorhexidine Gluconate Cloth  6 each Topical Q0600   clonazePAM  0.5 mg Per Tube BID   docusate  100 mg Per Tube BID   feeding supplement (PROSource TF)  90 mL Per Tube BID   guaiFENesin  10 mL Per Tube Q4H   hydrALAZINE  25 mg Per Tube Q8H   insulin aspart  0-20 Units Subcutaneous Q4H   insulin aspart  10 Units Subcutaneous Q4H   insulin glargine-yfgn  50 Units Subcutaneous BID   mouth rinse  15 mL Mouth Rinse 10 times per day   methocarbamol  1,000 mg Per Tube Q8H   pantoprazole sodium  40 mg Per Tube Daily    polyethylene glycol  17 g Per Tube Daily   QUEtiapine  100 mg Per Tube BID   senna  1 tablet Per Tube Daily   sodium chloride flush  10-40 mL Intracatheter Q12H    have reviewed scheduled and prn medications.  Physical Exam: General: Alert, opening eyes, tracheostomy and on vent.   Heart: RRR, s1s2 nl Lungs: Coarse breath sound bilateral. Abdomen:soft, nontender. Extremities: Only trace dependent edema. Neurology:Alert awake and following commands Dialysis Access: Left subclavian temporary HD catheter placed on 9/7.  Angel [name] Angel [name] 01/26/2021,9:28 AM  LOS: 29 days

## 2021-01-26 NOTE — Progress Notes (Signed)
ANTICOAGULATION CONSULT NOTE  Pharmacy Consult for bivalirudin Indication: atrial fibrillation, DVT, PE 8/23   No Known Allergies  Patient Measurements: Height: 6\' 2"  (188 cm) Weight: 117.9 kg (259 lb 14.8 oz) IBW/kg (Calculated) : 82.2 Heparin Dosing Weight: 107kg  Vital Signs: Temp: 100.4 F (38 C) (09/10 1600) Temp Source: Oral (09/10 1600) BP: 160/81 (09/10 1700) Pulse Rate: 90 (09/10 1700)  Labs: Recent Labs    01/24/21 0357 01/24/21 0643 01/24/21 1635 01/25/21 0402 01/25/21 0950 01/26/21 0452 01/26/21 1516  HGB 7.5*  --   --  8.1*  --  7.9*  --   HCT 24.4*  --   --  26.1*  --  24.8*  --   PLT 182  --   --  191  --  186  --   APTT 57*  --   --  52*  --  69* 62*  CREATININE 1.87*   < > 1.77*  --  1.84* 3.67*  --    < > = values in this interval not displayed.     Estimated Creatinine Clearance: 25.6 mL/min (A) (by C-G formula based on SCr of 3.67 mg/dL (H)).   Assessment: 55 YOM presenting s/p fall with TBI/SAH and facial fx, in afib started on amiodarone and now cleared per trauma for full dose anticoagulation. 8/23 patient found to have small acute bilateral PE and age-indeterminate LUE DVT. Pharmacy consulted to dose bivalirudin per Trauma.  Given recent head bleed, will aim for middle of therapeutic range aptt and watch closely for signs and symptoms of bleeding.   aPTT this evening is within goal range (62).  No overt bleeding or complications noted.  Goal of Therapy:  Aptt goal ~50-65s per discussion with Trauma  Monitor platelets by anticoagulation protocol: Yes   Plan:  Continue bivalirudin at 0.018 mg/kg/hr Daily aPTT, CBC.  [place] Crane, Roylene Reason, BCCP Clinical Pharmacist  01/26/2021 5:11 PM   [facility] pharmacy phone numbers are listed on [email or web address]

## 2021-01-26 NOTE — Progress Notes (Signed)
Follow up - Trauma and Critical Care  Patient Details:    Angel [name] is an 70 y.o. male.  Anti-infectives:  Anti-infectives (From admission, onward)    Start     Dose/Rate Route Frequency Ordered Stop   01/11/21 2330  ceFEPIme (MAXIPIME) 2 g in sodium chloride 0.9 % 100 mL IVPB  Status:  Discontinued        2 g 200 mL/hr over 30 Minutes Intravenous Every 24 hours 01/11/21 0711 01/16/21 0907   01/10/21 1645  ampicillin (OMNIPEN) 2 g in sodium chloride 0.9 % 100 mL IVPB  Status:  Discontinued        2 g 300 mL/hr over 20 Minutes Intravenous Every 8 hours 01/10/21 1549 01/16/21 0907   01/09/21 2200  ceFEPIme (MAXIPIME) 2 g in sodium chloride 0.9 % 100 mL IVPB  Status:  Discontinued        2 g 200 mL/hr over 30 Minutes Intravenous Every 12 hours 01/09/21 1458 01/11/21 0711   01/08/21 1515  metroNIDAZOLE (FLAGYL) IVPB 500 mg  Status:  Discontinued        500 mg 100 mL/hr over 60 Minutes Intravenous Every 8 hours 01/08/21 1428 01/10/21 1618   01/03/21 0600  vancomycin (VANCOREADY) IVPB 1250 mg/250 mL  Status:  Discontinued        1,250 mg 166.7 mL/hr over 90 Minutes Intravenous Every 12 hours 01/02/21 1717 01/03/21 0837   01/02/21 1800  vancomycin (VANCOREADY) IVPB 2000 mg/400 mL        2,000 mg 200 mL/hr over 120 Minutes Intravenous  Once 01/02/21 1712 01/02/21 2007   01/02/21 0900  ceFEPIme (MAXIPIME) 2 g in sodium chloride 0.9 % 100 mL IVPB  Status:  Discontinued        2 g 200 mL/hr over 30 Minutes Intravenous Every 8 hours 01/02/21 0849 01/09/21 1458       Consults: Treatment Team:  Georganna Skeans, MD Roney Jaffe, MD   Chief Complaint/Subjective:    Overnight Issues: No acute issues overnight.  Objective:  Vital signs for last 24 hours: Temp:  [98.3 F (36.8 C)-101.7 F (38.7 C)] 99.2 F (37.3 C) (09/10 1200) Pulse Rate:  [73-104] 85 (09/10 1207) Resp:  [18-32] 27 (09/10 1207) BP: (119-184)/(56-79) 120/63 (09/10 1200) SpO2:  [99 %-100 %] 100 % (09/10  1207) FiO2 (%):  [40 %] 40 % (09/10 1207)  Hemodynamic parameters for last 24 hours: CVP:  [4 mmHg-15 mmHg] 14 mmHg  Intake/Output from previous day: 09/09 0701 - 09/10 0700 In: 1854.6 [I.V.:124.6; NG/GT:1730] Out: 821 [Urine:285; Stool:50]  Intake/Output this shift: Total I/O In: 367.1 [I.V.:27.1; Other:30; NG/GT:310] Out: 200 [Urine:100; Stool:100]  Vent settings for last 24 hours: Vent Mode: PSV;CPAP FiO2 (%):  [40 %] 40 % Set Rate:  [15 bmp] 15 bmp Vt Set:  [570 mL] 570 mL PEEP:  [5 cmH20] 5 cmH20 Pressure Support:  [8 cmH20-10 cmH20] 8 cmH20 Plateau Pressure:  [10 NGE95-28 cmH20] 20 cmH20  Physical Exam:  General: awake on vent wean Neuro: F/C HEENT/Neck: trach-clean, intact Resp: clear to auscultation bilaterally CVS: RRR GI: soft, NT Extremities: less edema   Assessment/Plan:   Present on Admission:  SAH/SDH  Occipital bone fx/Temporal bone fx   LOS: 29 days   Fall down stairs 8/12   VDRF - guaifenisen, wean, S/P trach 8/29 by Dr. Bobbye Morton. Did HTC 4H yesterday, continue as able ID - off abx, no fevers, completed maxipime/ampicillin 8/31, CT A/P with ascending colitis and distention. Stool studies and  C. dif are all negative. 9/8 Resp cult with rare GNRs.  TBI/SAH/SDH - NSGY c/s, Dr. Annette Stable. Significant frontal lobe injuries. Keppra x7d for sz ppx (completed) Occipital bone fx - NSGY c/s, Dr. Annette Stable Temporal bone fx extending into middle ear - ENT c/s, Dr. Constance Holster Right TM Rupture - ENT c/s, Dr. Phoebe Sharps -  cardene off, hydral 25q8.   ABL anemia - stable Bilateral pulmonary embolism - bivalirudin AKI - CRRT stopped 9/9. Switched to [facility] per renal. Plan for HD today and Monday 9/12. Renal following.  Hx DM2 - resistant SSI, novolog q4, glargine  50u BID Hx HTN - PRN meds FEN - NPO, TF, Klonopin, dex now off VTE - SCDs, bival gtt Dispo - ICU, iHD, HTC as able  Additional comments:I reviewed the patient's new clinical lab test results.    Critical Care  Total Time*: 35 minutes  Drucie Ip 01/26/2021  *Care during the described time interval was provided by me and/or other providers on the critical care team.  I have reviewed this patient's available data, including medical history, events of note, physical examination and test results as part of my evaluation.

## 2021-01-27 ENCOUNTER — Inpatient Hospital Stay (HOSPITAL_COMMUNITY): Payer: PPO

## 2021-01-27 ENCOUNTER — Encounter (HOSPITAL_COMMUNITY): Payer: Self-pay

## 2021-01-27 DIAGNOSIS — I4891 Unspecified atrial fibrillation: Secondary | ICD-10-CM | POA: Diagnosis not present

## 2021-01-27 LAB — CBC
HCT: 27 % — ABNORMAL LOW (ref 39.0–52.0)
Hemoglobin: 8.3 g/dL — ABNORMAL LOW (ref 13.0–17.0)
MCH: 25.9 pg — ABNORMAL LOW (ref 26.0–34.0)
MCHC: 30.7 g/dL (ref 30.0–36.0)
MCV: 84.4 fL (ref 80.0–100.0)
Platelets: 236 10*3/uL (ref 150–400)
RBC: 3.2 MIL/uL — ABNORMAL LOW (ref 4.22–5.81)
RDW: 19 % — ABNORMAL HIGH (ref 11.5–15.5)
WBC: 27.3 10*3/uL — ABNORMAL HIGH (ref 4.0–10.5)
nRBC: 0 % (ref 0.0–0.2)

## 2021-01-27 LAB — CULTURE, RESPIRATORY W GRAM STAIN

## 2021-01-27 LAB — POCT I-STAT 7, (LYTES, BLD GAS, ICA,H+H)
Acid-Base Excess: 0 mmol/L (ref 0.0–2.0)
Bicarbonate: 22.9 mmol/L (ref 20.0–28.0)
Calcium, Ion: 1.14 mmol/L — ABNORMAL LOW (ref 1.15–1.40)
HCT: 29 % — ABNORMAL LOW (ref 39.0–52.0)
Hemoglobin: 9.9 g/dL — ABNORMAL LOW (ref 13.0–17.0)
O2 Saturation: 100 %
Patient temperature: 98.3
Potassium: 5.2 mmol/L — ABNORMAL HIGH (ref 3.5–5.1)
Sodium: 135 mmol/L (ref 135–145)
TCO2: 24 mmol/L (ref 22–32)
pCO2 arterial: 31.8 mmHg — ABNORMAL LOW (ref 32.0–48.0)
pH, Arterial: 7.464 — ABNORMAL HIGH (ref 7.350–7.450)
pO2, Arterial: 160 mmHg — ABNORMAL HIGH (ref 83.0–108.0)

## 2021-01-27 LAB — RENAL FUNCTION PANEL
Albumin: 2.5 g/dL — ABNORMAL LOW (ref 3.5–5.0)
Albumin: 2.3 g/dL — ABNORMAL LOW (ref 3.5–5.0)
Anion gap: 16 — ABNORMAL HIGH (ref 5–15)
Anion gap: 15 (ref 5–15)
BUN: 98 mg/dL — ABNORMAL HIGH (ref 8–23)
BUN: 109 mg/dL — ABNORMAL HIGH (ref 8–23)
CO2: 21 mmol/L — ABNORMAL LOW (ref 22–32)
CO2: 22 mmol/L (ref 22–32)
Calcium: 9.4 mg/dL (ref 8.9–10.3)
Calcium: 9 mg/dL (ref 8.9–10.3)
Chloride: 97 mmol/L — ABNORMAL LOW (ref 98–111)
Chloride: 97 mmol/L — ABNORMAL LOW (ref 98–111)
Creatinine, Ser: 3.61 mg/dL — ABNORMAL HIGH (ref 0.61–1.24)
Creatinine, Ser: 3.63 mg/dL — ABNORMAL HIGH (ref 0.61–1.24)
GFR, Estimated: 17 mL/min — ABNORMAL LOW (ref >60)
GFR, Estimated: 17 mL/min — ABNORMAL LOW (ref >60)
Glucose, Bld: 227 mg/dL — ABNORMAL HIGH (ref 70–99)
Glucose, Bld: 148 mg/dL — ABNORMAL HIGH (ref 70–99)
Phosphorus: 6.5 mg/dL — ABNORMAL HIGH (ref 2.5–4.6)
Phosphorus: 7.6 mg/dL — ABNORMAL HIGH (ref 2.5–4.6)
Potassium: 5.3 mmol/L — ABNORMAL HIGH (ref 3.5–5.1)
Potassium: 5.4 mmol/L — ABNORMAL HIGH (ref 3.5–5.1)
Sodium: 134 mmol/L — ABNORMAL LOW (ref 135–145)
Sodium: 134 mmol/L — ABNORMAL LOW (ref 135–145)

## 2021-01-27 LAB — GLUCOSE, CAPILLARY
Glucose-Capillary: 127 mg/dL — ABNORMAL HIGH (ref 70–99)
Glucose-Capillary: 130 mg/dL — ABNORMAL HIGH (ref 70–99)
Glucose-Capillary: 132 mg/dL — ABNORMAL HIGH (ref 70–99)
Glucose-Capillary: 140 mg/dL — ABNORMAL HIGH (ref 70–99)
Glucose-Capillary: 181 mg/dL — ABNORMAL HIGH (ref 70–99)
Glucose-Capillary: 213 mg/dL — ABNORMAL HIGH (ref 70–99)
Glucose-Capillary: 230 mg/dL — ABNORMAL HIGH (ref 70–99)

## 2021-01-27 LAB — APTT
aPTT: 47 s — ABNORMAL HIGH (ref 24–36)
aPTT: 56 s — ABNORMAL HIGH (ref 24–36)

## 2021-01-27 LAB — HEPATITIS B SURFACE ANTIGEN: Hepatitis B Surface Ag: NONREACTIVE

## 2021-01-27 LAB — MAGNESIUM: Magnesium: 2.8 mg/dL — ABNORMAL HIGH (ref 1.7–2.4)

## 2021-01-27 MED ORDER — AMIODARONE HCL IN DEXTROSE 360-4.14 MG/200ML-% IV SOLN
30.0000 mg/h | INTRAVENOUS | Status: DC
  Administered 2021-01-27 – 2021-02-06 (×19): 30 mg/h via INTRAVENOUS
  Filled 2021-01-27 (×22): qty 200

## 2021-01-27 MED ORDER — PRISMASOL BGK 4/2.5 32-4-2.5 MEQ/L REPLACEMENT SOLN
Status: DC
  Filled 2021-01-27 (×9): qty 5000

## 2021-01-27 MED ORDER — ADENOSINE 6 MG/2ML IV SOLN
INTRAVENOUS | Status: AC
  Filled 2021-01-27: qty 2

## 2021-01-27 MED ORDER — AMIODARONE HCL IN DEXTROSE 360-4.14 MG/200ML-% IV SOLN: 30.0000 mg/h | INTRAVENOUS | Status: DC

## 2021-01-27 MED ORDER — HEPARIN SODIUM (PORCINE) 1000 UNIT/ML IJ SOLN
INTRAMUSCULAR | Status: AC
  Administered 2021-01-27: 1000 [IU] via INTRAVENOUS_CENTRAL
  Filled 2021-01-27: qty 4

## 2021-01-27 MED ORDER — AMIODARONE HCL IN DEXTROSE 360-4.14 MG/200ML-% IV SOLN
60.0000 mg/h | INTRAVENOUS | Status: DC
  Administered 2021-01-27: 60 mg/h via INTRAVENOUS
  Filled 2021-01-27: qty 200

## 2021-01-27 MED ORDER — PRISMASOL BGK 4/2.5 32-4-2.5 MEQ/L EC SOLN
Status: DC
  Filled 2021-01-27 (×26): qty 5000

## 2021-01-27 MED ORDER — PRISMASOL BGK 0/2.5 32-2.5 MEQ/L EC SOLN
Status: DC
  Filled 2021-01-27 (×8): qty 5000

## 2021-01-27 NOTE — Progress Notes (Signed)
Reviewed XR, ok to restart TF at 16ml/h and slowly advance to goal

## 2021-01-27 NOTE — Progress Notes (Signed)
[facility] NEPHROLOGY PROGRESS NOTE  Assessment/ Plan:  #Acute kidney injury, oliguric: Multifactorial etiology including ischemic ATN in the setting of hypotension, sepsis complicated by contrast injury. CRRT from 9/1-9/9.  No heparin as he is on bivalirudin.  The HD catheter was changed on 9/7.  Attempted intermittent HD on 9/10 however patient became more confused, hypotensive therefore unable to tolerate it.  He looks more confused today and has tachycardia.  Noted BUN has gone up.  I will resume CRRT today with a goal UF 100 cc an hour.  Already on Angiomax.  Discussed with nurse. Continue to monitor strict ins and out, daily lab.  #Fall/bilateral subarachnoid hemorrhage/SDH, TBI/occipital and temporal bone fracture: Per trauma team.  Repeat CT scan with no acute finding.  #Acute respiratory failure: Status post trach on 8/29 and on vent.  #A. fib with RVR: On amiodarone, Angiomax.  # Anemia of critical illness: Transfuse as needed.  #Metabolic acidosis: Managed with dialysis.  Discontinued oral sodium bicarbonate.  #Bilateral pulm embolism: Currently on anticoagulation.  #Acute febrile illness: Per primary team.  #Hyperkalemia: Resuming CRRT.  Subjective: Seen and examined.  Overnight event noted.  Patient did not tolerate dialysis.  Urine output only 300 cc.  Looks more confused and not following commands. Objective Vital signs in last 24 hours: Vitals:   01/27/21 0630 01/27/21 0700 01/27/21 0800 01/27/21 0842  BP: 117/80 110/82 107/76   Pulse: (!) 128 (!) 110 100 99  Resp: (!) 22 (!) 24 (!) 21 (!) 25  Temp:      TempSrc:      SpO2: 98% 98% 97% 98%  Weight:      Height:       Weight change:   Intake/Output Summary (Last 24 hours) at 01/27/2021 0935 Last data filed at 01/27/2021 0800 Gross per 24 hour  Intake 1840.39 ml  Output 2232 ml  Net -391.61 ml        Labs: Basic Metabolic Panel: Recent Labs  Lab 01/25/21 0950 01/26/21 0452  01/27/21 0227  NA 133* 134* 134*  135  K 4.8 6.2* 5.3*  5.2*  CL 96* 99 97*  CO2 25 21* 21*  GLUCOSE 219* 91 227*  BUN 53* 98* 98*  CREATININE 1.84* 3.67* 3.61*  CALCIUM 9.0 9.6 9.4  PHOS 4.1 6.3* 6.5*    Liver Function Tests: Recent Labs  Lab 01/25/21 0950 01/26/21 0452 01/27/21 0227  ALBUMIN 2.5* 2.4* 2.5*    No results for input(s): LIPASE, AMYLASE in the last 168 hours. No results for input(s): AMMONIA in the last 168 hours. CBC: Recent Labs  Lab 01/23/21 0412 01/24/21 0357 01/25/21 0402 01/26/21 0452 01/27/21 0227  WBC 11.2* 9.6 13.5* 14.4* 27.3*  HGB 7.5* 7.5* 8.1* 7.9* 8.3*  9.9*  HCT 24.1* 24.4* 26.1* 24.8* 27.0*  29.0*  MCV 86.1 85.9 85.0 84.4 84.4  PLT 205 182 191 186 236    Cardiac Enzymes: No results for input(s): CKTOTAL, CKMB, CKMBINDEX, TROPONINI in the last 168 hours. CBG: Recent Labs  Lab 01/26/21 1941 01/26/21 2321 01/27/21 0215 01/27/21 0337 01/27/21 0736  GLUCAP 198* 219* 230* 213* 181*     Iron Studies: No results for input(s): IRON, TIBC, TRANSFERRIN, FERRITIN in the last 72 hours. Studies/Results: CT HEAD WO CONTRAST (5MM)  Result Date: 01/27/2021 CLINICAL DATA:  Altered mental status. EXAM: CT HEAD WITHOUT CONTRAST TECHNIQUE: Contiguous axial images were obtained from the base of the skull through the vertex without intravenous contrast. COMPARISON:  Head CT dated 12/29/2020.  FINDINGS: Brain: Mild age-related atrophy and chronic microvascular ischemic changes. Small hypodense extra-axial fluid in the subdural spaces along the occipital lobes measuring up to 6 mm in thickness on the left likely represent subacute or chronic bleed or hygroma or possibly sequela of previously seen intracranial hemorrhage. No acute intracranial hemorrhage. No midline shift. Vascular: No hyperdense vessel or unexpected calcification. Skull: Normal. Negative for fracture or focal lesion. Sinuses/Orbits: There is mild diffuse mucoperiosteal thickening of  paranasal sinuses. Partially visualized nasogastric tube. Bilateral mastoid effusions, right greater than left. Other: None IMPRESSION: 1. No acute intracranial hemorrhage. Probable small old subdural collection or hygroma along the occipital lobes. 2. Mild age-related atrophy and chronic microvascular ischemic changes. 3. Bilateral mastoid effusions, right greater than left. Electronically Signed   By: Anner Crete M.D.   On: 01/27/2021 03:33    Medications: Infusions:   prismasol BGK 4/2.5     sodium chloride     bivalirudin (ANGIOMAX) infusion 0.5 mg/mL (Non-ACS indications) 0.02 mg/kg/hr (01/27/21 0800)   feeding supplement (PIVOT 1.5 CAL) Stopped (01/27/21 0605)   prismasol BGK 2/2.5 replacement solution     prismasol BGK 4/2.5      Scheduled Medications:  acetaminophen  1,000 mg Per Tube Q6H   amiodarone  200 mg Per Tube BID   Followed by   Derrill Memo ON 01/31/2021] amiodarone  200 mg Per Tube Daily   chlorhexidine gluconate (MEDLINE KIT)  15 mL Mouth Rinse BID   Chlorhexidine Gluconate Cloth  6 each Topical Q0600   clonazePAM  0.5 mg Per Tube BID   docusate  100 mg Per Tube BID   feeding supplement (PROSource TF)  90 mL Per Tube BID   guaiFENesin  10 mL Per Tube Q4H   hydrALAZINE  25 mg Per Tube Q8H   insulin aspart  0-20 Units Subcutaneous Q4H   insulin aspart  10 Units Subcutaneous Q4H   insulin glargine-yfgn  50 Units Subcutaneous BID   mouth rinse  15 mL Mouth Rinse 10 times per day   methocarbamol  1,000 mg Per Tube Q8H   pantoprazole sodium  40 mg Per Tube Daily   polyethylene glycol  17 g Per Tube Daily   QUEtiapine  100 mg Per Tube BID   senna  1 tablet Per Tube Daily   sodium chloride flush  10-40 mL Intracatheter Q12H    have reviewed scheduled and prn medications.  Physical Exam: General: Looks confused, not following commands, tracheostomy on vent. Heart: Tachycardic, s1s2 nl Lungs: Coarse breath sound bilateral. Abdomen:soft, nontender. Extremities: Only  trace dependent edema. Neurology:Alert awake and following commands Dialysis Access: Left subclavian temporary HD catheter placed on 9/7.  Elmer Boutelle Prasad Shynice Sigel 01/27/2021,9:35 AM  LOS: 30 days

## 2021-01-27 NOTE — Progress Notes (Addendum)
Progress Note  Patient Name: Angel [name] Date of Encounter: 01/27/2021  Primary Cardiologist:   Werner Lean, MD   Subjective   Opens eyes.  Trached.    Inpatient Medications    Scheduled Meds:  acetaminophen  1,000 mg Per Tube Q6H   amiodarone  200 mg Per Tube BID   Followed by   Derrill Memo ON 01/31/2021] amiodarone  200 mg Per Tube Daily   chlorhexidine gluconate (MEDLINE KIT)  15 mL Mouth Rinse BID   Chlorhexidine Gluconate Cloth  6 each Topical Q0600   clonazePAM  0.5 mg Per Tube BID   docusate  100 mg Per Tube BID   feeding supplement (PROSource TF)  90 mL Per Tube BID   guaiFENesin  10 mL Per Tube Q4H   hydrALAZINE  25 mg Per Tube Q8H   insulin aspart  0-20 Units Subcutaneous Q4H   insulin aspart  10 Units Subcutaneous Q4H   insulin glargine-yfgn  50 Units Subcutaneous BID   mouth rinse  15 mL Mouth Rinse 10 times per day   methocarbamol  1,000 mg Per Tube Q8H   pantoprazole sodium  40 mg Per Tube Daily   polyethylene glycol  17 g Per Tube Daily   QUEtiapine  100 mg Per Tube BID   senna  1 tablet Per Tube Daily   sodium chloride flush  10-40 mL Intracatheter Q12H   Continuous Infusions:   prismasol BGK 4/2.5     sodium chloride     bivalirudin (ANGIOMAX) infusion 0.5 mg/mL (Non-ACS indications) 0.02 mg/kg/hr (01/27/21 0800)   feeding supplement (PIVOT 1.5 CAL) Stopped (01/27/21 0605)   prismasol BGK 2/2.5 replacement solution     prismasol BGK 4/2.5     PRN Meds: Place/Maintain arterial line **AND** sodium chloride, artificial tears, heparin, hydrALAZINE, HYDROmorphone (DILAUDID) injection, midazolam, ondansetron **OR** ondansetron (ZOFRAN) IV, oxyCODONE, sodium chloride flush   Vital Signs    Vitals:   01/27/21 0700 01/27/21 0800 01/27/21 0842 01/27/21 1000  BP: 110/82 107/76    Pulse: (!) 110 100 99   Resp: (!) 24 (!) 21 (!) 25   Temp:      TempSrc:      SpO2: 98% 97% 98%   Weight:    110 kg  Height:        Intake/Output Summary (Last 24  hours) at 01/27/2021 1103 Last data filed at 01/27/2021 0952 Gross per 24 hour  Intake 1558.43 ml  Output 2132 ml  Net -573.57 ml   Filed Weights   01/23/21 0500 01/24/21 0444 01/27/21 1000  Weight: 120.7 kg 117.9 kg 110 kg    Telemetry    Atrial fib with RVR - Personally Reviewed  ECG    NA - Personally Reviewed  Physical Exam   GEN: Critically ill appearing   Neck: No  JVD Cardiac: Irregular RR, no murmurs, rubs, or gallops.  Respiratory: Clear   to auscultation bilaterally. GI: Soft, nontender, non-distended, normal bowel sounds  MS:  Diffuse mild edema; No deformity. Neuro:   Nonfocal  Psych: Unable to assess   Labs    Chemistry Recent Labs  Lab 01/25/21 0950 01/26/21 0452 01/27/21 0227  NA 133* 134* 134*  135  K 4.8 6.2* 5.3*  5.2*  CL 96* 99 97*  CO2 25 21* 21*  GLUCOSE 219* 91 227*  BUN 53* 98* 98*  CREATININE 1.84* 3.67* 3.61*  CALCIUM 9.0 9.6 9.4  ALBUMIN 2.5* 2.4* 2.5*  GFRNONAA 39* 17* 17*  ANIONGAP  12 14 16*     Hematology Recent Labs  Lab 01/25/21 0402 01/26/21 0452 01/27/21 0227  WBC 13.5* 14.4* 27.3*  RBC 3.07* 2.94* 3.20*  HGB 8.1* 7.9* 8.3*  9.9*  HCT 26.1* 24.8* 27.0*  29.0*  MCV 85.0 84.4 84.4  MCH 26.4 26.9 25.9*  MCHC 31.0 31.9 30.7  RDW 18.7* 18.9* 19.0*  PLT 191 186 236    Cardiac EnzymesNo results for input(s): TROPONINI in the last 168 hours. No results for input(s): TROPIPOC in the last 168 hours.   BNPNo results for input(s): BNP, PROBNP in the last 168 hours.   DDimer No results for input(s): DDIMER in the last 168 hours.   Radiology    CT HEAD WO CONTRAST (5MM)  Result Date: 01/27/2021 CLINICAL DATA:  Altered mental status. EXAM: CT HEAD WITHOUT CONTRAST TECHNIQUE: Contiguous axial images were obtained from the base of the skull through the vertex without intravenous contrast. COMPARISON:  Head CT dated 12/29/2020. FINDINGS: Brain: Mild age-related atrophy and chronic microvascular ischemic changes. Small  hypodense extra-axial fluid in the subdural spaces along the occipital lobes measuring up to 6 mm in thickness on the left likely represent subacute or chronic bleed or hygroma or possibly sequela of previously seen intracranial hemorrhage. No acute intracranial hemorrhage. No midline shift. Vascular: No hyperdense vessel or unexpected calcification. Skull: Normal. Negative for fracture or focal lesion. Sinuses/Orbits: There is mild diffuse mucoperiosteal thickening of paranasal sinuses. Partially visualized nasogastric tube. Bilateral mastoid effusions, right greater than left. Other: None IMPRESSION: 1. No acute intracranial hemorrhage. Probable small old subdural collection or hygroma along the occipital lobes. 2. Mild age-related atrophy and chronic microvascular ischemic changes. 3. Bilateral mastoid effusions, right greater than left. Electronically Signed   By: Anner Crete M.D.   On: 01/27/2021 03:33   DG Abd Portable 1V  Result Date: 01/27/2021 CLINICAL DATA:  Vomiting.  Fell down stairs. EXAM: PORTABLE ABDOMEN - 1 VIEW COMPARISON:  01/16/2021 FINDINGS: Normal bowel gas pattern. Feeding tube tip in the mid stomach. No visible free peritoneal air. Lumbar and lower thoracic spine degenerative changes. IMPRESSION: Feeding tube tip in the mid stomach.  No acute abnormality. Electronically Signed   By: Claudie Revering M.D.   On: 01/27/2021 10:22    Cardiac Studies   Echo 01/07/21: 1. Left ventricular ejection fraction, by estimation, is 60 to 65%. The  left ventricle has normal function. The left ventricle has no regional  wall motion abnormalities. There is mild left ventricular hypertrophy.  Left ventricular diastolic parameters  are indeterminate.   2. Right ventricule is poorly visualized but grossly normal size and  systolic function   3. Left atrial size was mildly dilated.   4. Right atrial size was mildly dilated.   5. The mitral valve is normal in structure. No evidence of mitral valve   regurgitation. No evidence of mitral stenosis.   6. The aortic valve was not well visualized. Aortic valve regurgitation  is not visualized. No aortic stenosis is present.   Patient Profile     70 y.o. male with a PMH of hyperlipidemia, DM type II, who presented with fall resulting in subarachnoid hemorrhage requiring intubation with hospital course complicated by Multi lobar pneumonia, PE, sepsis, AKI requiring CRRT, and new onset atrial fibrillation/flutter for which cardiology is following.  Assessment & Plan    New onset paroxysmal atrial flutter:   Atrial fib with rapid rate now.   On angiomax.    Transition to PO anticoag when  OK with primary team and no further invasive procedures planned.   Will restart IV amiodarone.  Unable to titrate meds for rate control.    Hypoxic respiratory failure: Hospital course complicated by multilobar PNA and bilateral PE:  Continue vent management per primary team.   AKI:  Unable to tolerate intermittent HD.  Now on CRRT.     For questions or updates, please contact [place] Please consult [email or web address] for contact info under Cardiology/STEMI.   Signed, Minus Breeding, MD  01/27/2021, 11:03 AM

## 2021-01-27 NOTE — Progress Notes (Signed)
Follow up - Trauma and Critical Care  Patient Details:    Angel [name] is an 70 y.o. male.  Anti-infectives:  Anti-infectives (From admission, onward)    Start     Dose/Rate Route Frequency Ordered Stop   01/11/21 2330  ceFEPIme (MAXIPIME) 2 g in sodium chloride 0.9 % 100 mL IVPB  Status:  Discontinued        2 g 200 mL/hr over 30 Minutes Intravenous Every 24 hours 01/11/21 0711 01/16/21 0907   01/10/21 1645  ampicillin (OMNIPEN) 2 g in sodium chloride 0.9 % 100 mL IVPB  Status:  Discontinued        2 g 300 mL/hr over 20 Minutes Intravenous Every 8 hours 01/10/21 1549 01/16/21 0907   01/09/21 2200  ceFEPIme (MAXIPIME) 2 g in sodium chloride 0.9 % 100 mL IVPB  Status:  Discontinued        2 g 200 mL/hr over 30 Minutes Intravenous Every 12 hours 01/09/21 1458 01/11/21 0711   01/08/21 1515  metroNIDAZOLE (FLAGYL) IVPB 500 mg  Status:  Discontinued        500 mg 100 mL/hr over 60 Minutes Intravenous Every 8 hours 01/08/21 1428 01/10/21 1618   01/03/21 0600  vancomycin (VANCOREADY) IVPB 1250 mg/250 mL  Status:  Discontinued        1,250 mg 166.7 mL/hr over 90 Minutes Intravenous Every 12 hours 01/02/21 1717 01/03/21 0837   01/02/21 1800  vancomycin (VANCOREADY) IVPB 2000 mg/400 mL        2,000 mg 200 mL/hr over 120 Minutes Intravenous  Once 01/02/21 1712 01/02/21 2007   01/02/21 0900  ceFEPIme (MAXIPIME) 2 g in sodium chloride 0.9 % 100 mL IVPB  Status:  Discontinued        2 g 200 mL/hr over 30 Minutes Intravenous Every 8 hours 01/02/21 0849 01/09/21 1458       Consults: Treatment Team:  Georganna Skeans, MD Roney Jaffe, MD   Chief Complaint/Subjective:    Overnight Issues: Vomited tube feeds overnight, failed dialysis yesterday with A fib RVR and unresponsive  Objective:  Vital signs for last 24 hours: Temp:  [98.3 F (36.8 C)-100.4 F (38 C)] 98.8 F (37.1 C) (09/11 0400) Pulse Rate:  [83-155] 99 (09/11 0842) Resp:  [17-36] 25 (09/11 0842) BP: (102-199)/(52-99)  107/76 (09/11 0800) SpO2:  [96 %-100 %] 98 % (09/11 0842) FiO2 (%):  [30 %-40 %] 30 % (09/11 0842)  Hemodynamic parameters for last 24 hours:    Intake/Output from previous day: 09/10 0701 - 09/11 0700 In: 1941.6 [I.V.:111.1; NG/GT:1770.4] Out: 2232 [Urine:300; Stool:275]  Intake/Output this shift: Total I/O In: 39.2 [I.V.:9.2; Other:30] Out: -   Vent settings for last 24 hours: Vent Mode: PRVC FiO2 (%):  [30 %-40 %] 30 % Set Rate:  [15 bmp] 15 bmp Vt Set:  [570 mL] 570 mL PEEP:  [5 cmH20] 5 cmH20 Pressure Support:  [8 cmH20] [place] Pressure:  [19 cmH20-20 cmH20] 19 cmH20  Physical Exam:  Gen: NAE HEENT: trach in place, COrtrak in position Resp: full vent support Cardiovascular: tachycardic Abdomen: distended Ext: no edema Neuro: gcs 6t   Assessment/Plan:   Fall down stairs 8/12   VDRF - guaifenisen, wean, S/P trach 8/29 by Dr. Bobbye Morton. Did HTC 4H yesterday, continue as able ID - off abx, no fevers, completed maxipime/ampicillin 8/31, CT A/P with ascending colitis and distention. Stool studies and C. dif are all negative. 9/8 Resp cult with rare GNRs.  TBI/SAH/SDH - NSGY  c/s, Dr. Annette Stable. Significant frontal lobe injuries. Keppra x7d for sz ppx (completed) Occipital bone fx - NSGY c/s, Dr. Annette Stable Temporal bone fx extending into middle ear - ENT c/s, Dr. Constance Holster Right TM Rupture - ENT c/s, Dr. Phoebe Sharps -  cardene off, hydral 25q8.   ABL anemia - stable Bilateral pulmonary embolism - bivalirudin AKI - CRRT stopped 9/9. Attempted iHD 9/10 with a fib RVR. Renal following.  Hx DM2 - resistant SSI, novolog q4, glargine  50u BID Hx HTN - PRN meds FEN - NPO, hold TF and KUB, Klonopin, dex now off VTE - SCDs, bival gtt Dispo - ICU, likely crrt again, HTC as able     LOS: 30 days   Critical Care Total Time*: 35 minutes  Arta Bruce Jodean Valade 01/27/2021  *Care during the described time interval was provided by me and/or other providers on the critical care team.   I have reviewed this patient's available data, including medical history, events of note, physical examination and test results as part of my evaluation.

## 2021-01-27 NOTE — Progress Notes (Signed)
Called because of abrupt change in HR from 130s>>60s  Tel reviewed and patient has reverted to sinus Tel demonstrates repeated episodes of afib, so would continue amio but decrease to 30 mg min

## 2021-01-27 NOTE — Progress Notes (Addendum)
[place] for bivalirudin Indication: atrial fibrillation, DVT, PE 8/23   No Known Allergies  Patient Measurements: Height: 6\' 2"  (188 cm) Weight: 117.9 kg (259 lb 14.8 oz) IBW/kg (Calculated) : 82.2 Heparin Dosing Weight: 107kg  Vital Signs: Temp: 98.8 F (37.1 C) (09/11 0400) Temp Source: Oral (09/11 0400) BP: 117/80 (09/11 0630) Pulse Rate: 128 (09/11 0630)  Labs: Recent Labs    01/25/21 0402 01/25/21 0950 01/26/21 0452 01/26/21 1516 01/27/21 0227  HGB 8.1*  --  7.9*  --  8.3*  9.9*  HCT 26.1*  --  24.8*  --  27.0*  29.0*  PLT 191  --  186  --  236  APTT 52*  --  69* 62* 47*  CREATININE  --  1.84* 3.67*  --  3.61*     Estimated Creatinine Clearance: 26 mL/min (A) (by C-G formula based on SCr of 3.61 mg/dL (H)).   Assessment: 45 YOM presenting s/p fall with TBI/SAH and facial fx, in afib started on amiodarone and now cleared per trauma for full dose anticoagulation. 8/23 patient found to have small acute bilateral PE and age-indeterminate LUE DVT. Pharmacy consulted to dose bivalirudin per Trauma.  Given recent head bleed, will aim for middle of therapeutic range aptt and watch closely for signs and symptoms of bleeding.   aPTT this morning slightly low at 47. Noted, CRRT was stopped yesterday and transitioned to IHD; however, session overnight stopped early (only completed ~2-3hrs). No bleeding or issues with infusion per discussion with RN.  Goal of Therapy:  Aptt goal ~50-65s per discussion with Trauma  Monitor platelets by anticoagulation protocol: Yes   Plan:  Increase bivalirudin back to 0.02 mg/kg/hr Check 4hr aPTT Monitor daily CBC, s/sx bleeding F/u long-term plan for anticoagulation as appropriate   Arturo Morton, PharmD, BCPS Please check AMION for all [place] contact numbers Clinical Pharmacist 01/27/2021 7:28 AM   ADDENDUM - aPTT therapeutic this afternoon . CRRT resumed. Next level with AM  labs. No active bleed issues reported.  Arturo Morton, PharmD, BCPS Please check AMION for all [place] contact numbers Clinical Pharmacist 01/27/2021 2:16 PM

## 2021-01-27 NOTE — Progress Notes (Addendum)
0017 Pt into Afib with RVR. Pt sleeping, HD currently running. RN paged Dr. Darnell Level, cardiology. No new orders at this time, [facility] and call cards back if pt is symptomatic or HR>160s-170s.   0145 HD RN called bedside RN to room, concerned about patient's breathing. Pt now having "guppy" breathing. RN paged respiratory therapist and charge. RN attempted to arouse pt, unable to do so with sternal rubbing or painful stimulus, pt no longer opening eyes. Pulse obtained by doppler. VSS, charge RN at bedside to assist.   [phone] Paged Dr. Nadeen Landau, new orders received for ABG, CT head, and to page nephro.   0209 HD RN called Dr. Jonnie Finner, nephrology regarding patient's change in mental status. HD stopped after 1.6L removed. Getting pt ready to go to CT.   0215 Pt diaphoretic, blood sugar taken, 230. Pt's temp retaken and now WNL at 98.3. Pt taken to CT scan with respiratory and Christian, NT.   0300 Pt back up to room and linen changed. Pt now withdrawing from pain, opening eyes. WCTM.   2248 Pt converted from uncontrolled Afib to NSR, rate in the low 90s.  0517 Pt flipped back into uncontrolled Afib. WCTM.   2500 Pt vomiting tube feeds. Tube feeds held, medicated per MAR.

## 2021-01-28 ENCOUNTER — Inpatient Hospital Stay (HOSPITAL_COMMUNITY): Payer: PPO

## 2021-01-28 DIAGNOSIS — I4891 Unspecified atrial fibrillation: Secondary | ICD-10-CM | POA: Diagnosis not present

## 2021-01-28 LAB — RENAL FUNCTION PANEL
Albumin: 2.4 g/dL — ABNORMAL LOW (ref 3.5–5.0)
Albumin: 2.4 g/dL — ABNORMAL LOW (ref 3.5–5.0)
Anion gap: 14 (ref 5–15)
Anion gap: 13 (ref 5–15)
BUN: 87 mg/dL — ABNORMAL HIGH (ref 8–23)
BUN: 73 mg/dL — ABNORMAL HIGH (ref 8–23)
CO2: 22 mmol/L (ref 22–32)
CO2: 22 mmol/L (ref 22–32)
Calcium: 9 mg/dL (ref 8.9–10.3)
Calcium: 8.8 mg/dL — ABNORMAL LOW (ref 8.9–10.3)
Chloride: 96 mmol/L — ABNORMAL LOW (ref 98–111)
Chloride: 97 mmol/L — ABNORMAL LOW (ref 98–111)
Creatinine, Ser: 2.8 mg/dL — ABNORMAL HIGH (ref 0.61–1.24)
Creatinine, Ser: 2.26 mg/dL — ABNORMAL HIGH (ref 0.61–1.24)
GFR, Estimated: 24 mL/min — ABNORMAL LOW (ref >60)
GFR, Estimated: 30 mL/min — ABNORMAL LOW (ref >60)
Glucose, Bld: 164 mg/dL — ABNORMAL HIGH (ref 70–99)
Glucose, Bld: 217 mg/dL — ABNORMAL HIGH (ref 70–99)
Phosphorus: 6.6 mg/dL — ABNORMAL HIGH (ref 2.5–4.6)
Phosphorus: 4.9 mg/dL — ABNORMAL HIGH (ref 2.5–4.6)
Potassium: 5.2 mmol/L — ABNORMAL HIGH (ref 3.5–5.1)
Potassium: 4.9 mmol/L (ref 3.5–5.1)
Sodium: 132 mmol/L — ABNORMAL LOW (ref 135–145)
Sodium: 132 mmol/L — ABNORMAL LOW (ref 135–145)

## 2021-01-28 LAB — CBC
HCT: 24.9 % — ABNORMAL LOW (ref 39.0–52.0)
Hemoglobin: 7.8 g/dL — ABNORMAL LOW (ref 13.0–17.0)
MCH: 26.9 pg (ref 26.0–34.0)
MCHC: 31.3 g/dL (ref 30.0–36.0)
MCV: 85.9 fL (ref 80.0–100.0)
Platelets: 164 10*3/uL (ref 150–400)
RBC: 2.9 MIL/uL — ABNORMAL LOW (ref 4.22–5.81)
RDW: 18.6 % — ABNORMAL HIGH (ref 11.5–15.5)
WBC: 11.7 10*3/uL — ABNORMAL HIGH (ref 4.0–10.5)
nRBC: 0 % (ref 0.0–0.2)

## 2021-01-28 LAB — GLUCOSE, CAPILLARY
Glucose-Capillary: 149 mg/dL — ABNORMAL HIGH (ref 70–99)
Glucose-Capillary: 152 mg/dL — ABNORMAL HIGH (ref 70–99)
Glucose-Capillary: 169 mg/dL — ABNORMAL HIGH (ref 70–99)
Glucose-Capillary: 170 mg/dL — ABNORMAL HIGH (ref 70–99)
Glucose-Capillary: 192 mg/dL — ABNORMAL HIGH (ref 70–99)
Glucose-Capillary: 203 mg/dL — ABNORMAL HIGH (ref 70–99)

## 2021-01-28 LAB — MAGNESIUM: Magnesium: 2.9 mg/dL — ABNORMAL HIGH (ref 1.7–2.4)

## 2021-01-28 LAB — APTT: aPTT: 51 s — ABNORMAL HIGH (ref 24–36)

## 2021-01-28 LAB — HEPATITIS B SURFACE ANTIBODY, QUANTITATIVE: Hep B S AB Quant (Post): 3.1 m[IU]/mL — ABNORMAL LOW (ref >9.9)

## 2021-01-28 MED ORDER — AMIODARONE IV BOLUS ONLY 150 MG/100ML: 150.0000 mg | Freq: Once | INTRAVENOUS | Status: DC

## 2021-01-28 MED ORDER — AMIODARONE IV BOLUS ONLY 150 MG/100ML
150.0000 mg | Freq: Once | INTRAVENOUS | Status: AC
  Administered 2021-01-28: 150 mg via INTRAVENOUS
  Filled 2021-01-28: qty 100

## 2021-01-28 MED ORDER — AMIODARONE LOAD VIA INFUSION
150.0000 mg | Freq: Once | INTRAVENOUS | Status: AC
  Administered 2021-01-29: 150 mg via INTRAVENOUS
  Filled 2021-01-28: qty 83.34

## 2021-01-28 NOTE — Progress Notes (Signed)
Patient ID: Angel [name], male   DOB: 1951-01-02, 70 y.o.   MRN: [id number] Follow up - Trauma Critical Care  Patient Details:    EREK KOWAL is an 70 y.o. male.  Lines/tubes : PICC Triple Lumen 67/12/45 PICC Right Basilic 47 cm 1 cm (Active)  Indication for Insertion or Continuance of Line Prolonged intravenous therapies;Limited venous access - need for IV therapy >5 days (PICC only) 01/28/21 0800  Exposed Catheter (cm) 1 cm 01/09/21 0916  Site Assessment Dry;Clean;Intact 01/28/21 0800  Lumen #1 Status Infusing 01/28/21 0800  Lumen #2 Status Infusing 01/28/21 0800  Lumen #3 Status In-line blood sampling system in place;Flushed;Blood return noted 01/28/21 0800  Dressing Type Transparent 01/28/21 0800  Dressing Status Clean;Dry;Intact 01/28/21 0800  Antimicrobial disc in place? Yes 01/28/21 0800  Safety Lock Not Applicable 80/99/83 3825  Line Care Connections checked and tightened 01/28/21 0800  Dressing Intervention Other (Comment) 01/26/21 2100  Dressing Change Due 02/01/21 01/28/21 0800     External Urinary Catheter (Active)  Collection Container Dedicated Suction Canister 01/28/21 0800  Suction (Verified suction is between 40-80 mmHg) Yes 01/28/21 0800  Securement Method Tape 01/27/21 2000  Site Assessment Clean;Intact 01/28/21 0800  Intervention Male External Urinary Catheter Replaced 01/24/21 1445  Output (mL) 0 mL 01/28/21 0500     Fecal Management System (Active)  Does patient meet criteria for removal? No 01/28/21 0800  Daily care Skin around tube assessed;Flushed tube with 14mL water (document as intake);Bag changed (every 24 hours) 01/28/21 0800  Output (mL) 0 mL 01/28/21 0500  Intake (mL) 30 mL 01/27/21 0800    Microbiology/Sepsis markers: Results for orders placed or performed during the hospital encounter of 12/28/20  Resp Panel by RT-PCR (Flu A&B, Covid) Nasopharyngeal Swab     Status: None   Collection Time: 12/28/20  4:17 PM   Specimen: Nasopharyngeal Swab;  Nasopharyngeal(NP) swabs in vial transport medium  Result Value Ref Range Status   SARS Coronavirus 2 by RT PCR NEGATIVE NEGATIVE Final    Comment: (NOTE) SARS-CoV-2 target nucleic acids are NOT DETECTED.  The SARS-CoV-2 RNA is generally detectable in upper respiratory specimens during the acute phase of infection. The lowest concentration of SARS-CoV-2 viral copies this assay can detect is 138 copies/mL. A negative result does not preclude SARS-Cov-2 infection and should not be used as the sole basis for treatment or other patient management decisions. A negative result may occur with  improper specimen collection/handling, submission of specimen other than nasopharyngeal swab, presence of viral mutation(s) within the areas targeted by this assay, and inadequate number of viral copies(<138 copies/mL). A negative result must be combined with clinical observations, patient history, and epidemiological information. The expected result is Negative.  Fact Sheet for Patients:  EntrepreneurPulse.com.au  Fact Sheet for Healthcare Providers:  IncredibleEmployment.be  This test is no t yet approved or cleared by the [place] FDA and  has been authorized for detection and/or diagnosis of SARS-CoV-2 by FDA under an Emergency Use Authorization (EUA). This EUA will remain  in effect (meaning this test can be used) for the duration of the COVID-19 declaration under Section 564(b)(1) of the Act, 21 U.S.C.section 360bbb-3(b)(1), unless the authorization is terminated  or revoked sooner.       Influenza A by PCR NEGATIVE NEGATIVE Final   Influenza B by PCR NEGATIVE NEGATIVE Final    Comment: (NOTE) The Xpert Xpress SARS-CoV-2/FLU/RSV plus assay is intended as an aid in the diagnosis of influenza from Nasopharyngeal swab specimens and  should not be used as a sole basis for treatment. Nasal washings and aspirates are unacceptable for Xpert Xpress  SARS-CoV-2/FLU/RSV testing.  Fact Sheet for Patients: EntrepreneurPulse.com.au  Fact Sheet for Healthcare Providers: IncredibleEmployment.be  This test is not yet approved or cleared by the [place] FDA and has been authorized for detection and/or diagnosis of SARS-CoV-2 by FDA under an Emergency Use Authorization (EUA). This EUA will remain in effect (meaning this test can be used) for the duration of the COVID-19 declaration under Section 564(b)(1) of the Act, 21 U.S.C. section 360bbb-3(b)(1), unless the authorization is terminated or revoked.  Performed at [facility], [place] [address] [place], [place] [address]   MRSA Next Gen by PCR, Nasal     Status: None   Collection Time: 12/28/20  7:32 PM   Specimen: Nasal Mucosa; Nasal Swab  Result Value Ref Range Status   MRSA by PCR Next Gen NOT DETECTED NOT DETECTED Final    Comment: (NOTE) The GeneXpert MRSA Assay (FDA approved for NASAL specimens only), is one component of a comprehensive MRSA colonization surveillance program. It is not intended to diagnose MRSA infection nor to guide or monitor treatment for MRSA infections. Test performance is not FDA approved in patients less than 57 years old. Performed at [facility], [place] [address] [place], [place] [address]   Culture, Respiratory w Gram Stain     Status: None   Collection Time: 12/31/20 11:06 AM   Specimen: Tracheal Aspirate; Respiratory  Result Value Ref Range Status   Specimen Description TRACHEAL ASPIRATE  Final   Special Requests NONE  Final   Gram Stain   Final    FEW SQUAMOUS EPITHELIAL CELLS PRESENT FEW WBC PRESENT,BOTH PMN AND MONONUCLEAR FEW GRAM POSITIVE COCCI Performed at [facility], [place] [address] [place], [place] [address]    Culture   Final    FEW PSEUDOMONAS AERUGINOSA FEW STREPTOCOCCUS PNEUMONIAE    Report Status 01/03/2021 FINAL  Final   Organism ID, Bacteria PSEUDOMONAS AERUGINOSA   Final   Organism ID, Bacteria STREPTOCOCCUS PNEUMONIAE  Final      Susceptibility   Pseudomonas aeruginosa - MIC*    CEFTAZIDIME 4 SENSITIVE Sensitive     CIPROFLOXACIN <=0.25 SENSITIVE Sensitive     GENTAMICIN <=1 SENSITIVE Sensitive     IMIPENEM 2 SENSITIVE Sensitive     PIP/TAZO 8 SENSITIVE Sensitive     CEFEPIME 2 SENSITIVE Sensitive     * FEW PSEUDOMONAS AERUGINOSA   Streptococcus pneumoniae - MIC*    ERYTHROMYCIN 4 RESISTANT Resistant     LEVOFLOXACIN 0.5 SENSITIVE Sensitive     VANCOMYCIN <=0.12 SENSITIVE Sensitive     PENO - penicillin <=0.06      PENICILLIN (non-meningitis) <=0.06 SENSITIVE Sensitive     PENICILLIN (oral) <=0.06 SENSITIVE Sensitive     CEFTRIAXONE (non-meningitis) <=0.12 SENSITIVE Sensitive     * FEW STREPTOCOCCUS PNEUMONIAE  Culture, blood (routine x 2)     Status: None   Collection Time: 01/05/21 11:44 AM   Specimen: BLOOD  Result Value Ref Range Status   Specimen Description BLOOD SITE NOT SPECIFIED  Final   Special Requests AEROBIC BOTTLE ONLY Blood Culture adequate volume  Final   Culture   Final    NO GROWTH 5 DAYS Performed at [facility], [address] [place], [place] [address]    Report Status 01/10/2021 FINAL  Final  Culture, blood (routine x 2)     Status: None  Collection Time: 01/05/21 11:44 AM   Specimen: BLOOD  Result Value Ref Range Status   Specimen Description BLOOD SITE NOT SPECIFIED  Final   Special Requests   Final    AEROBIC BOTTLE ONLY Blood Culture results may not be optimal due to an inadequate volume of blood received in culture bottles   Culture   Final    NO GROWTH 5 DAYS Performed at [facility], [address] [place], [place] [address]    Report Status 01/10/2021 FINAL  Final  Surgical PCR screen     Status: None   Collection Time: 01/08/21 12:14 AM   Specimen: Nasal Mucosa; Nasal Swab  Result Value Ref Range Status   MRSA, PCR NEGATIVE NEGATIVE Final   Staphylococcus aureus NEGATIVE NEGATIVE  Final    Comment: (NOTE) The Xpert SA Assay (FDA approved for NASAL specimens in patients 74 years of age and older), is one component of a comprehensive surveillance program. It is not intended to diagnose infection nor to guide or monitor treatment. Performed at [facility], [place] [address], [place], [place] [address]   Culture, Respiratory w Gram Stain     Status: None   Collection Time: 01/08/21  1:24 PM   Specimen: Tracheal Aspirate; Respiratory  Result Value Ref Range Status   Specimen Description TRACHEAL ASPIRATE  Final   Special Requests NONE  Final   Gram Stain   Final    RARE SQUAMOUS EPITHELIAL CELLS PRESENT MODERATE WBC PRESENT, PREDOMINANTLY MONONUCLEAR FEW GRAM NEGATIVE RODS Performed at [facility], [place] [address], [place], [place] [address]    Culture   Final    RARE PSEUDOMONAS AERUGINOSA RARE ENTEROCOCCUS FAECALIS    Report Status 01/11/2021 FINAL  Final   Organism ID, Bacteria PSEUDOMONAS AERUGINOSA  Final   Organism ID, Bacteria ENTEROCOCCUS FAECALIS  Final      Susceptibility   Enterococcus faecalis - MIC*    AMPICILLIN <=2 SENSITIVE Sensitive     VANCOMYCIN 1 SENSITIVE Sensitive     GENTAMICIN SYNERGY SENSITIVE Sensitive     * RARE ENTEROCOCCUS FAECALIS   Pseudomonas aeruginosa - MIC*    CEFTAZIDIME 4 SENSITIVE Sensitive     CIPROFLOXACIN <=0.25 SENSITIVE Sensitive     GENTAMICIN <=1 SENSITIVE Sensitive     IMIPENEM 2 SENSITIVE Sensitive     PIP/TAZO 8 SENSITIVE Sensitive     CEFEPIME 2 SENSITIVE Sensitive     * RARE PSEUDOMONAS AERUGINOSA  Gastrointestinal Panel by PCR , Stool     Status: None   Collection Time: 01/08/21  5:04 PM   Specimen: Stool  Result Value Ref Range Status   Campylobacter species NOT DETECTED NOT DETECTED Final   Plesimonas shigelloides NOT DETECTED NOT DETECTED Final   Salmonella species NOT DETECTED NOT DETECTED Final   Yersinia enterocolitica NOT DETECTED NOT DETECTED Final   Vibrio species NOT DETECTED  NOT DETECTED Final   Vibrio cholerae NOT DETECTED NOT DETECTED Final   Enteroaggregative E coli (EAEC) NOT DETECTED NOT DETECTED Final   Enteropathogenic E coli (EPEC) NOT DETECTED NOT DETECTED Final   Enterotoxigenic E coli (ETEC) NOT DETECTED NOT DETECTED Final   Shiga like toxin producing E coli (STEC) NOT DETECTED NOT DETECTED Final   Shigella/Enteroinvasive E coli (EIEC) NOT DETECTED NOT DETECTED Final   Cryptosporidium NOT DETECTED NOT DETECTED Final   Cyclospora cayetanensis NOT DETECTED NOT DETECTED Final   Entamoeba histolytica NOT DETECTED NOT DETECTED Final   Giardia lamblia NOT DETECTED  NOT DETECTED Final   Adenovirus F40/41 NOT DETECTED NOT DETECTED Final   Astrovirus NOT DETECTED NOT DETECTED Final   Norovirus GI/GII NOT DETECTED NOT DETECTED Final   Rotavirus A NOT DETECTED NOT DETECTED Final   Sapovirus (I, II, IV, and V) NOT DETECTED NOT DETECTED Final    Comment: Performed at [facility], [place], [place] [address]  C Difficile Quick Screen (NO PCR Reflex)     Status: None   Collection Time: 01/09/21 11:07 AM   Specimen: STOOL  Result Value Ref Range Status   C Diff antigen NEGATIVE NEGATIVE Final   C Diff toxin NEGATIVE NEGATIVE Final   C Diff interpretation No C. difficile detected.  Final    Comment: Performed at [facility], [place] [address], [place], [place] [address]  Culture, Respiratory w Gram Stain     Status: None   Collection Time: 01/24/21  8:57 AM   Specimen: Tracheal Aspirate; Respiratory  Result Value Ref Range Status   Specimen Description TRACHEAL ASPIRATE  Final   Special Requests NONE  Final   Gram Stain   Final    ABUNDANT WBC PRESENT, PREDOMINANTLY MONONUCLEAR RARE FEW GRAM NEGATIVE RODS    Culture   Final    ABUNDANT PSEUDOMONAS AERUGINOSA Two isolates with different morphologies were identified as the same organism.The most resistant organism was reported. Performed at [facility], [place] [address] [place], [place] [address]    Report Status 01/27/2021 FINAL  Final   Organism ID, Bacteria PSEUDOMONAS AERUGINOSA  Final      Susceptibility   Pseudomonas aeruginosa - MIC*    CEFTAZIDIME 16 INTERMEDIATE Intermediate     CIPROFLOXACIN 1 SENSITIVE Sensitive     GENTAMICIN <=1 SENSITIVE Sensitive     IMIPENEM 2 SENSITIVE Sensitive     * ABUNDANT PSEUDOMONAS AERUGINOSA    Anti-infectives:  Anti-infectives (From admission, onward)    Start     Dose/Rate Route Frequency Ordered Stop   01/11/21 2330  ceFEPIme (MAXIPIME) 2 g in sodium chloride 0.9 % 100 mL IVPB  Status:  Discontinued        2 g 200 mL/hr over 30 Minutes Intravenous Every 24 hours 01/11/21 0711 01/16/21 0907   01/10/21 1645  ampicillin (OMNIPEN) 2 g in sodium chloride 0.9 % 100 mL IVPB  Status:  Discontinued        2 g 300 mL/hr over 20 Minutes Intravenous Every 8 hours 01/10/21 1549 01/16/21 0907   01/09/21 2200  ceFEPIme (MAXIPIME) 2 g in sodium chloride 0.9 % 100 mL IVPB  Status:  Discontinued        2 g 200 mL/hr over 30 Minutes Intravenous Every 12 hours 01/09/21 1458 01/11/21 0711   01/08/21 1515  metroNIDAZOLE (FLAGYL) IVPB 500 mg  Status:  Discontinued        500 mg 100 mL/hr over 60 Minutes Intravenous Every 8 hours 01/08/21 1428 01/10/21 1618   01/03/21 0600  vancomycin (VANCOREADY) IVPB 1250 mg/250 mL  Status:  Discontinued        1,250 mg 166.7 mL/hr over 90 Minutes Intravenous Every 12 hours 01/02/21 1717 01/03/21 0837   01/02/21 1800  vancomycin (VANCOREADY) IVPB 2000 mg/400 mL        2,000 mg 200 mL/hr over 120 Minutes Intravenous  Once 01/02/21 1712 01/02/21 2007   01/02/21 0900  ceFEPIme (MAXIPIME) 2 g in sodium chloride 0.9 % 100 mL IVPB  Status:  Discontinued  2 g 200 mL/hr over 30 Minutes Intravenous Every 8 hours 01/02/21 0849 01/09/21 1458       Best Practice/Protocols:  VTE Prophylaxis: Direct Thrombin Inhibitor Intermittent Sedation  Consults: Treatment Team:  Georganna Skeans,  MD Roney Jaffe, MD    Studies:    Events:  Subjective:    Overnight Issues:   Objective:  Vital signs for last 24 hours: Temp:  [94.4 F (34.7 C)-100.5 F (38.1 C)] 98.6 F (37 C) (09/12 0736) Pulse Rate:  [52-129] 103 (09/12 0900) Resp:  [13-30] 16 (09/12 0900) BP: (79-141)/(50-86) 106/71 (09/12 0900) SpO2:  [98 %-100 %] 100 % (09/12 0900) FiO2 (%):  [30 %] 30 % (09/12 0800) Weight:  [110 kg-112.3 kg] 112.3 kg (09/12 0500)  Hemodynamic parameters for last 24 hours:    Intake/Output from previous day: 09/11 0701 - 09/12 0700 In: 1308.2 [I.V.:483.2; NG/GT:795] Out: 3202   Intake/Output this shift: Total I/O In: 133.7 [I.V.:43.7; NG/GT:90] Out: 317 [Other:317]  Vent settings for last 24 hours: Vent Mode: CPAP;PSV FiO2 (%):  [30 %] 30 % Set Rate:  [15 bmp] 15 bmp Vt Set:  [570 mL] 570 mL PEEP:  [5 cmH20] 5 cmH20 Pressure Support:  [5 cmH20] 5 cmH20 Plateau Pressure:  [13 cmH20-16 cmH20] 16 cmH20  Physical Exam:  General: on vent wean Neuro: F/C HEENT/Neck: trach-clean, intact Resp: clear to auscultation bilaterally CVS: IRR GI: soft, a little distended Extremities: less edema  Results for orders placed or performed during the hospital encounter of 12/28/20 (from the past 24 hour(s))  Glucose, capillary     Status: Abnormal   Collection Time: 01/27/21 11:51 AM  Result Value Ref Range   Glucose-Capillary 130 (H) 70 - 99 mg/dL  APTT     Status: Abnormal   Collection Time: 01/27/21 12:00 PM  Result Value Ref Range   aPTT 56 (H) 24 - 36 seconds  Glucose, capillary     Status: Abnormal   Collection Time: 01/27/21  4:08 PM  Result Value Ref Range   Glucose-Capillary 132 (H) 70 - 99 mg/dL  Renal function panel (daily at 1600)     Status: Abnormal   Collection Time: 01/27/21  4:40 PM  Result Value Ref Range   Sodium 134 (L) 135 - 145 mmol/L   Potassium 5.4 (H) 3.5 - 5.1 mmol/L   Chloride 97 (L) 98 - 111 mmol/L   CO2 22 22 - 32 mmol/L   Glucose, Bld  148 (H) 70 - 99 mg/dL   BUN 109 (H) 8 - 23 mg/dL   Creatinine, Ser 3.63 (H) 0.61 - 1.24 mg/dL   Calcium 9.0 8.9 - 10.3 mg/dL   Phosphorus 7.6 (H) 2.5 - 4.6 mg/dL   Albumin 2.3 (L) 3.5 - 5.0 g/dL   GFR, Estimated 17 (L) >60 mL/min   Anion gap 15 5 - 15  Glucose, capillary     Status: Abnormal   Collection Time: 01/27/21  7:19 PM  Result Value Ref Range   Glucose-Capillary 127 (H) 70 - 99 mg/dL  Glucose, capillary     Status: Abnormal   Collection Time: 01/27/21 11:37 PM  Result Value Ref Range   Glucose-Capillary 140 (H) 70 - 99 mg/dL  CBC     Status: Abnormal   Collection Time: 01/28/21  3:19 AM  Result Value Ref Range   WBC 11.7 (H) 4.0 - 10.5 K/uL   RBC 2.90 (L) 4.22 - 5.81 MIL/uL   Hemoglobin 7.8 (L) 13.0 - 17.0 g/dL   HCT  24.9 (L) 39.0 - 52.0 %   MCV 85.9 80.0 - 100.0 fL   MCH 26.9 26.0 - 34.0 pg   MCHC 31.3 30.0 - 36.0 g/dL   RDW 18.6 (H) 11.5 - 15.5 %   Platelets 164 150 - 400 K/uL   nRBC 0.0 0.0 - 0.2 %  APTT     Status: Abnormal   Collection Time: 01/28/21  3:19 AM  Result Value Ref Range   aPTT 51 (H) 24 - 36 seconds  Magnesium     Status: Abnormal   Collection Time: 01/28/21  3:19 AM  Result Value Ref Range   Magnesium 2.9 (H) 1.7 - 2.4 mg/dL  Renal function panel (daily at 0500)     Status: Abnormal   Collection Time: 01/28/21  3:19 AM  Result Value Ref Range   Sodium 132 (L) 135 - 145 mmol/L   Potassium 5.2 (H) 3.5 - 5.1 mmol/L   Chloride 96 (L) 98 - 111 mmol/L   CO2 22 22 - 32 mmol/L   Glucose, Bld 164 (H) 70 - 99 mg/dL   BUN 87 (H) 8 - 23 mg/dL   Creatinine, Ser 2.80 (H) 0.61 - 1.24 mg/dL   Calcium 9.0 8.9 - 10.3 mg/dL   Phosphorus 6.6 (H) 2.5 - 4.6 mg/dL   Albumin 2.4 (L) 3.5 - 5.0 g/dL   GFR, Estimated 24 (L) >60 mL/min   Anion gap 14 5 - 15  Glucose, capillary     Status: Abnormal   Collection Time: 01/28/21  3:45 AM  Result Value Ref Range   Glucose-Capillary 149 (H) 70 - 99 mg/dL  Glucose, capillary     Status: Abnormal   Collection Time:  01/28/21  7:16 AM  Result Value Ref Range   Glucose-Capillary 152 (H) 70 - 99 mg/dL    Assessment & Plan: Present on Admission: **None**    LOS: 31 days   Additional comments:I reviewed the patient's new clinical lab test results. . Fall down stairs 8/12   VDRF - guaifenisen, wean, S/P trach 8/29 by Dr. Bobbye Morton. Did HTC 4H yesterday, continue as able ID - off abx, no fevers, completed maxipime/ampicillin 8/31, CT A/P with ascending colitis and distention. Stool studies and C. dif are all negative. 9/8 Resp cult Pseud - likely colonized. WBC 11.7 TBI/SAH/SDH - NSGY c/s, Dr. Annette Stable. Significant frontal lobe injuries. Keppra x7d for sz ppx (completed) Occipital bone fx - NSGY c/s, Dr. Annette Stable Temporal bone fx extending into middle ear - ENT c/s, Dr. Constance Holster Right TM Rupture - ENT c/s, Dr. Phoebe Sharps -  cardene off, hydral 25q8.   ABL anemia - stable Bilateral pulmonary embolism - bivalirudin AKI - did not tolerate HD over the weekend. Back on CRRT per Renal Hx DM2 - resistant SSI, novolog q4, glargine  50u BID Hx HTN - PRN meds FEN - NPO, hold TF and KUB, Klonopin VTE - SCDs, bival gtt Dispo - ICU, HTC as able Critical Care Total Time*: 34 Minutes  Georganna Skeans, MD, MPH, FACS Trauma & General Surgery Use [email or web address] to contact on call provider  01/28/2021  *Care during the described time interval was provided by me. I have reviewed this patient's available data, including medical history, events of note, physical examination and test results as part of my evaluation.

## 2021-01-28 NOTE — Progress Notes (Signed)
Progress Note  Patient Name: Angel [name] Date of Encounter: 01/28/2021  Primary Cardiologist:   Werner Lean, MD   Subjective  Awake, trached  Inpatient Medications    Scheduled Meds:  acetaminophen  1,000 mg Per Tube Q6H   chlorhexidine gluconate (MEDLINE KIT)  15 mL Mouth Rinse BID   Chlorhexidine Gluconate Cloth  6 each Topical Q0600   clonazePAM  0.5 mg Per Tube BID   docusate  100 mg Per Tube BID   feeding supplement (PROSource TF)  90 mL Per Tube BID   guaiFENesin  10 mL Per Tube Q4H   hydrALAZINE  25 mg Per Tube Q8H   insulin aspart  0-20 Units Subcutaneous Q4H   insulin aspart  10 Units Subcutaneous Q4H   insulin glargine-yfgn  50 Units Subcutaneous BID   mouth rinse  15 mL Mouth Rinse 10 times per day   methocarbamol  1,000 mg Per Tube Q8H   pantoprazole sodium  40 mg Per Tube Daily   polyethylene glycol  17 g Per Tube Daily   QUEtiapine  100 mg Per Tube BID   senna  1 tablet Per Tube Daily   sodium chloride flush  10-40 mL Intracatheter Q12H   Continuous Infusions:   prismasol BGK 4/2.5 500 mL/hr at 01/28/21 0818   sodium chloride     amiodarone 30 mg/hr (01/28/21 0800)   bivalirudin (ANGIOMAX) infusion 0.5 mg/mL (Non-ACS indications) 0.02 mg/kg/hr (01/28/21 0800)   feeding supplement (PIVOT 1.5 CAL) 40 mL/hr at 01/27/21 2309   prismasol BGK 2/2.5 replacement solution 300 mL/hr at 01/28/21 0453   prismasol BGK 4/2.5 1,500 mL/hr at 01/28/21 0817   PRN Meds: Place/Maintain arterial line **AND** sodium chloride, artificial tears, heparin, hydrALAZINE, HYDROmorphone (DILAUDID) injection, midazolam, ondansetron **OR** ondansetron (ZOFRAN) IV, oxyCODONE, sodium chloride flush   Vital Signs    Vitals:   01/28/21 0736 01/28/21 0759 01/28/21 0800 01/28/21 0830  BP:   109/61 (!) 141/68  Pulse:  (!) 116 95 (!) 108  Resp:  17 (!) 22 13  Temp: 98.6 F (37 C)     TempSrc: Axillary     SpO2:  100% 100% 100%  Weight:      Height:        Intake/Output  Summary (Last 24 hours) at 01/28/2021 0854 Last data filed at 01/28/2021 0800 Gross per 24 hour  Intake 1330.8 ml  Output 3370 ml  Net -2039.2 ml    Filed Weights   01/24/21 0444 01/27/21 1000 01/28/21 0500  Weight: 117.9 kg 110 kg 112.3 kg    Telemetry  Atrial fibrillation with RVR - Personally Reviewed  ECG    NA - Personally Reviewed  Physical Exam   TIW:PYKDXIPJAS ill HEENT: Normal NECK: trach present LYMPHATICS: No lymphadenopathy CARDIAC:irregularly irregular and tachy, no murmurs, rubs, gallops RESPIRATORY:  Clear to auscultation without rales, wheezing or rhonchi  ABDOMEN: Soft, non-tender, non-distended MUSCULOSKELETAL:  No edema; No deformity  SKIN: Warm and dry NEUROLOGIC:  cannot assess PSYCHIATRIC:  cannot assess  Labs    Chemistry Recent Labs  Lab 01/27/21 0227 01/27/21 1640 01/28/21 0319  NA 134*  135 134* 132*  K 5.3*  5.2* 5.4* 5.2*  CL 97* 97* 96*  CO2 21* 22 22  GLUCOSE 227* 148* 164*  BUN 98* 109* 87*  CREATININE 3.61* 3.63* 2.80*  CALCIUM 9.4 9.0 9.0  ALBUMIN 2.5* 2.3* 2.4*  GFRNONAA 17* 17* 24*  ANIONGAP 16* 15 14      Hematology Recent  Labs  Lab 01/26/21 0452 01/27/21 0227 01/28/21 0319  WBC 14.4* 27.3* 11.7*  RBC 2.94* 3.20* 2.90*  HGB 7.9* 8.3*  9.9* 7.8*  HCT 24.8* 27.0*  29.0* 24.9*  MCV 84.4 84.4 85.9  MCH 26.9 25.9* 26.9  MCHC 31.9 30.7 31.3  RDW 18.9* 19.0* 18.6*  PLT 186 236 164     Cardiac EnzymesNo results for input(s): TROPONINI in the last 168 hours. No results for input(s): TROPIPOC in the last 168 hours.   BNPNo results for input(s): BNP, PROBNP in the last 168 hours.   DDimer No results for input(s): DDIMER in the last 168 hours.   Radiology    CT HEAD WO CONTRAST (5MM)  Result Date: 01/27/2021 CLINICAL DATA:  Altered mental status. EXAM: CT HEAD WITHOUT CONTRAST TECHNIQUE: Contiguous axial images were obtained from the base of the skull through the vertex without intravenous contrast.  COMPARISON:  Head CT dated 12/29/2020. FINDINGS: Brain: Mild age-related atrophy and chronic microvascular ischemic changes. Small hypodense extra-axial fluid in the subdural spaces along the occipital lobes measuring up to 6 mm in thickness on the left likely represent subacute or chronic bleed or hygroma or possibly sequela of previously seen intracranial hemorrhage. No acute intracranial hemorrhage. No midline shift. Vascular: No hyperdense vessel or unexpected calcification. Skull: Normal. Negative for fracture or focal lesion. Sinuses/Orbits: There is mild diffuse mucoperiosteal thickening of paranasal sinuses. Partially visualized nasogastric tube. Bilateral mastoid effusions, right greater than left. Other: None IMPRESSION: 1. No acute intracranial hemorrhage. Probable small old subdural collection or hygroma along the occipital lobes. 2. Mild age-related atrophy and chronic microvascular ischemic changes. 3. Bilateral mastoid effusions, right greater than left. Electronically Signed   By: Anner Crete M.D.   On: 01/27/2021 03:33   DG CHEST PORT 1 VIEW  Result Date: 01/28/2021 CLINICAL DATA:  Aspiration. EXAM: PORTABLE CHEST 1 VIEW COMPARISON:  Chest radiograph, 01/23/2021.  CT chest, 01/08/2021. FINDINGS: Support lines: LEFT subclavian temporary dialysis catheter, with tip at the brachiocephalic venous junction. Tracheostomy with tube tip at the midthoracic trachea. RIGHT upper extremity PICC with the tip within the RIGHT atrium. Enteric feeding tube, with tip excluded from view. Cardiomediastinal silhouette is unchanged. Hypoinflation. Relative decreased appearance of patchy bibasilar opacities. No large pleural effusion. No pneumothorax. No interval osseous abnormality. IMPRESSION: 1. Mildly improved aeration, with relative decreased appearance of bibasilar opacities. 2. Lines and tubes as above. Electronically Signed   By: Michaelle Birks M.D.   On: 01/28/2021 07:44   DG Abd Portable 1V  Result  Date: 01/27/2021 CLINICAL DATA:  Vomiting.  Fell down stairs. EXAM: PORTABLE ABDOMEN - 1 VIEW COMPARISON:  01/16/2021 FINDINGS: Normal bowel gas pattern. Feeding tube tip in the mid stomach. No visible free peritoneal air. Lumbar and lower thoracic spine degenerative changes. IMPRESSION: Feeding tube tip in the mid stomach.  No acute abnormality. Electronically Signed   By: Claudie Revering M.D.   On: 01/27/2021 10:22    Cardiac Studies   Echo 01/07/21: 1. Left ventricular ejection fraction, by estimation, is 60 to 65%. The  left ventricle has normal function. The left ventricle has no regional  wall motion abnormalities. There is mild left ventricular hypertrophy.  Left ventricular diastolic parameters  are indeterminate.   2. Right ventricule is poorly visualized but grossly normal size and  systolic function   3. Left atrial size was mildly dilated.   4. Right atrial size was mildly dilated.   5. The mitral valve is normal in structure. No  evidence of mitral valve  regurgitation. No evidence of mitral stenosis.   6. The aortic valve was not well visualized. Aortic valve regurgitation  is not visualized. No aortic stenosis is present.   Patient Profile     70 y.o. male with a PMH of hyperlipidemia, DM type II, who presented with fall resulting in subarachnoid hemorrhage requiring intubation with hospital course complicated by Multi lobar pneumonia, PE, sepsis, AKI requiring CRRT, and new onset atrial fibrillation/flutter for which cardiology is following.  Assessment & Plan    New onset paroxysmal atrial flutter:    -Atrial fib with rapid rate in the 115-120's -On angiomax.     -Transition to PO anticoag when OK with primary team and no further invasive procedures planned.    -continue IV Amio gtt>>will bolus with 132m over 20 min to try to get HR under better control -hypotension limits use of BB and CCB  Hypoxic respiratory failure:  -Hospital course complicated by multilobar PNA and  bilateral PE:   -Continue vent management per primary team.   AKI:   -Unable to tolerate intermittent HD.   -Now on CRRT.    I have spent a total of 30 minutes with patient reviewing hospital notes , telemetry, EKGs, labs and examining patient as well as establishing an assessment and plan that was discussed with the patient.  > 50% of time was spent in direct patient care.      For questions or updates, please contact CFox [place] GrovePlease consult [email or web address] for contact info under Cardiology/STEMI.   Signed, TFransico Him MD  01/28/2021, 8:54 AM

## 2021-01-28 NOTE — Progress Notes (Signed)
ANTICOAGULATION CONSULT NOTE  Pharmacy Consult for bivalirudin Indication: atrial fibrillation, DVT, PE 8/23   No Known Allergies  Patient Measurements: Height: 6\' 2"  (188 cm) Weight: 112.3 kg (247 lb 9.2 oz) IBW/kg (Calculated) : 82.2 Heparin Dosing Weight: 107kg  Vital Signs: Temp: 98.6 F (37 C) (09/12 0736) Temp Source: Axillary (09/12 0736) BP: 111/57 (09/12 0700) Pulse Rate: 94 (09/12 0700)  Labs: Recent Labs    01/26/21 0452 01/26/21 1516 01/27/21 0227 01/27/21 1200 01/27/21 1640 01/28/21 0319  HGB 7.9*  --  8.3*  9.9*  --   --  7.8*  HCT 24.8*  --  27.0*  29.0*  --   --  24.9*  PLT 186  --  236  --   --  164  APTT 69*   < > 47* 56*  --  51*  CREATININE 3.67*  --  3.61*  --  3.63* 2.80*   < > = values in this interval not displayed.     Estimated Creatinine Clearance: 32.7 mL/min (A) (by C-G formula based on SCr of 2.8 mg/dL (H)).   Assessment: 36 YOM presenting s/p fall with TBI/SAH and facial fx, in afib started on amiodarone and now cleared per trauma for full dose anticoagulation. 8/23 patient found to have small acute bilateral PE and age-indeterminate LUE DVT. Pharmacy consulted to dose bivalirudin per Trauma.  Given recent head bleed, will aim for middle of therapeutic range aptt and watch closely for signs and symptoms of bleeding.   aPTT this morning slightly remains therapeutic (aPTT 51, goal of 50-65). Hgb low but stable - no bleeding or infusion issues noted at this time.   Goal of Therapy:  Aptt goal ~50-65s per discussion with Trauma  Monitor platelets by anticoagulation protocol: Yes   Plan:  Continue bivalirudin at 0.02 mg/kg/hr Monitor daily aPTT, CBC, s/sx bleeding F/u long-term plan for anticoagulation as appropriate   Thank you for allowing pharmacy to be a part of this patient's care.  Alycia Rossetti, PharmD, BCPS Clinical Pharmacist Clinical phone for 01/28/2021: [phone] 01/28/2021 10:24 AM   **Pharmacist phone directory  can now be found on [email or web address] ([email or web address]).  Listed under [employer].

## 2021-01-28 NOTE — Progress Notes (Signed)
[facility] NEPHROLOGY PROGRESS NOTE  Assessment/ Plan:  #Acute kidney injury, oliguric: Multifactorial etiology including ischemic ATN in the setting of hypotension, sepsis complicated by contrast injury. CRRT from 9/1-9/9.  No heparin as he is on bivalirudin.  The HD catheter was changed on 9/7.  CRRT restarted on 9/11 given elevated BUN and with more confusion, did not tolerated IHD on 9/10 (confusion, hypotension). Will continue CRRT today with a goal UF 100 cc an hour. Will possibly switch over to IHD later this week. Already on Angiomax. Continue to monitor strict ins and out, daily lab.  #Fall/bilateral subarachnoid hemorrhage/SDH, TBI/occipital and temporal bone fracture: Per trauma team.  Repeat CT scan with no acute finding.  #Acute respiratory failure: Status post trach on 8/29 and on vent.  #A. fib with RVR: On amiodarone, Angiomax.  # Anemia of critical illness: Transfuse as needed.  #Metabolic acidosis: Managed with dialysis.   #Bilateral pulm embolism: Currently on anticoagulation.  #Acute febrile illness: Per primary team.  #Hyperkalemia: on CRRT.  Subjective: Seen and examined on CRRT. HR dropped from 130's to 60's, amio decreased. Objective Vital signs in last 24 hours: Vitals:   01/28/21 0730 01/28/21 0736 01/28/21 0759 01/28/21 0800  BP: (!) 87/62   109/61  Pulse: 90  (!) 116 95  Resp: 16  17 (!) 22  Temp:  98.6 F (37 C)    TempSrc:  Axillary    SpO2: 100%  100% 100%  Weight:      Height:       Weight change:   Intake/Output Summary (Last 24 hours) at 01/28/2021 0827 Last data filed at 01/28/2021 0800 Gross per 24 hour  Intake 1330.8 ml  Output 3370 ml  Net -2039.2 ml       Labs: Basic Metabolic Panel: Recent Labs  Lab 01/27/21 0227 01/27/21 1640 01/28/21 0319  NA 134*  135 134* 132*  K 5.3*  5.2* 5.4* 5.2*  CL 97* 97* 96*  CO2 21* 22 22  GLUCOSE 227* 148* 164*  BUN 98* 109* 87*  CREATININE 3.61* 3.63* 2.80*   CALCIUM 9.4 9.0 9.0  PHOS 6.5* 7.6* 6.6*   Liver Function Tests: Recent Labs  Lab 01/27/21 0227 01/27/21 1640 01/28/21 0319  ALBUMIN 2.5* 2.3* 2.4*   No results for input(s): LIPASE, AMYLASE in the last 168 hours. No results for input(s): AMMONIA in the last 168 hours. CBC: Recent Labs  Lab 01/24/21 0357 01/25/21 0402 01/26/21 0452 01/27/21 0227 01/28/21 0319  WBC 9.6 13.5* 14.4* 27.3* 11.7*  HGB 7.5* 8.1* 7.9* 8.3*  9.9* 7.8*  HCT 24.4* 26.1* 24.8* 27.0*  29.0* 24.9*  MCV 85.9 85.0 84.4 84.4 85.9  PLT 182 191 186 236 164   Cardiac Enzymes: No results for input(s): CKTOTAL, CKMB, CKMBINDEX, TROPONINI in the last 168 hours. CBG: Recent Labs  Lab 01/27/21 1608 01/27/21 1919 01/27/21 2337 01/28/21 0345 01/28/21 0716  GLUCAP 132* 127* 140* 149* 152*    Iron Studies: No results for input(s): IRON, TIBC, TRANSFERRIN, FERRITIN in the last 72 hours. Studies/Results: CT HEAD WO CONTRAST (5MM)  Result Date: 01/27/2021 CLINICAL DATA:  Altered mental status. EXAM: CT HEAD WITHOUT CONTRAST TECHNIQUE: Contiguous axial images were obtained from the base of the skull through the vertex without intravenous contrast. COMPARISON:  Head CT dated 12/29/2020. FINDINGS: Brain: Mild age-related atrophy and chronic microvascular ischemic changes. Small hypodense extra-axial fluid in the subdural spaces along the occipital lobes measuring up to 6 mm in thickness on the left likely  represent subacute or chronic bleed or hygroma or possibly sequela of previously seen intracranial hemorrhage. No acute intracranial hemorrhage. No midline shift. Vascular: No hyperdense vessel or unexpected calcification. Skull: Normal. Negative for fracture or focal lesion. Sinuses/Orbits: There is mild diffuse mucoperiosteal thickening of paranasal sinuses. Partially visualized nasogastric tube. Bilateral mastoid effusions, right greater than left. Other: None IMPRESSION: 1. No acute intracranial hemorrhage.  Probable small old subdural collection or hygroma along the occipital lobes. 2. Mild age-related atrophy and chronic microvascular ischemic changes. 3. Bilateral mastoid effusions, right greater than left. Electronically Signed   By: Anner Crete M.D.   On: 01/27/2021 03:33   DG CHEST PORT 1 VIEW  Result Date: 01/28/2021 CLINICAL DATA:  Aspiration. EXAM: PORTABLE CHEST 1 VIEW COMPARISON:  Chest radiograph, 01/23/2021.  CT chest, 01/08/2021. FINDINGS: Support lines: LEFT subclavian temporary dialysis catheter, with tip at the brachiocephalic venous junction. Tracheostomy with tube tip at the midthoracic trachea. RIGHT upper extremity PICC with the tip within the RIGHT atrium. Enteric feeding tube, with tip excluded from view. Cardiomediastinal silhouette is unchanged. Hypoinflation. Relative decreased appearance of patchy bibasilar opacities. No large pleural effusion. No pneumothorax. No interval osseous abnormality. IMPRESSION: 1. Mildly improved aeration, with relative decreased appearance of bibasilar opacities. 2. Lines and tubes as above. Electronically Signed   By: Michaelle Birks M.D.   On: 01/28/2021 07:44   DG Abd Portable 1V  Result Date: 01/27/2021 CLINICAL DATA:  Vomiting.  Fell down stairs. EXAM: PORTABLE ABDOMEN - 1 VIEW COMPARISON:  01/16/2021 FINDINGS: Normal bowel gas pattern. Feeding tube tip in the mid stomach. No visible free peritoneal air. Lumbar and lower thoracic spine degenerative changes. IMPRESSION: Feeding tube tip in the mid stomach.  No acute abnormality. Electronically Signed   By: Claudie Revering M.D.   On: 01/27/2021 10:22    Medications: Infusions:   prismasol BGK 4/2.5 500 mL/hr at 01/28/21 0818   sodium chloride     amiodarone 30 mg/hr (01/28/21 0800)   bivalirudin (ANGIOMAX) infusion 0.5 mg/mL (Non-ACS indications) 0.02 mg/kg/hr (01/28/21 0800)   feeding supplement (PIVOT 1.5 CAL) 40 mL/hr at 01/27/21 2309   prismasol BGK 2/2.5 replacement solution 300 mL/hr at  01/28/21 0453   prismasol BGK 4/2.5 1,500 mL/hr at 01/28/21 9983    Scheduled Medications:  acetaminophen  1,000 mg Per Tube Q6H   chlorhexidine gluconate (MEDLINE KIT)  15 mL Mouth Rinse BID   Chlorhexidine Gluconate Cloth  6 each Topical Q0600   clonazePAM  0.5 mg Per Tube BID   docusate  100 mg Per Tube BID   feeding supplement (PROSource TF)  90 mL Per Tube BID   guaiFENesin  10 mL Per Tube Q4H   hydrALAZINE  25 mg Per Tube Q8H   insulin aspart  0-20 Units Subcutaneous Q4H   insulin aspart  10 Units Subcutaneous Q4H   insulin glargine-yfgn  50 Units Subcutaneous BID   mouth rinse  15 mL Mouth Rinse 10 times per day   methocarbamol  1,000 mg Per Tube Q8H   pantoprazole sodium  40 mg Per Tube Daily   polyethylene glycol  17 g Per Tube Daily   QUEtiapine  100 mg Per Tube BID   senna  1 tablet Per Tube Daily   sodium chloride flush  10-40 mL Intracatheter Q12H    have reviewed scheduled and prn medications.  Physical Exam: General: nad, tracheostomy on vent. Heart: Tachycardic, s1s2 nl Lungs: Coarse breath sound bilateral. Abdomen:soft, nontender. Extremities: Only trace dependent edema.  Neurology:Alert awake and following commands Dialysis Access: Left subclavian temporary HD catheter placed on 9/7.  Danahi Reddish 01/28/2021,8:27 AM  LOS: 31 days

## 2021-01-29 DIAGNOSIS — I4891 Unspecified atrial fibrillation: Secondary | ICD-10-CM | POA: Diagnosis not present

## 2021-01-29 LAB — RENAL FUNCTION PANEL
Albumin: 2.5 g/dL — ABNORMAL LOW (ref 3.5–5.0)
Albumin: 2.5 g/dL — ABNORMAL LOW (ref 3.5–5.0)
Anion gap: 11 (ref 5–15)
Anion gap: 14 (ref 5–15)
BUN: 64 mg/dL — ABNORMAL HIGH (ref 8–23)
BUN: 64 mg/dL — ABNORMAL HIGH (ref 8–23)
CO2: 25 mmol/L (ref 22–32)
CO2: 24 mmol/L (ref 22–32)
Calcium: 9 mg/dL (ref 8.9–10.3)
Calcium: 9 mg/dL (ref 8.9–10.3)
Chloride: 96 mmol/L — ABNORMAL LOW (ref 98–111)
Chloride: 97 mmol/L — ABNORMAL LOW (ref 98–111)
Creatinine, Ser: 1.94 mg/dL — ABNORMAL HIGH (ref 0.61–1.24)
Creatinine, Ser: 1.87 mg/dL — ABNORMAL HIGH (ref 0.61–1.24)
GFR, Estimated: 37 mL/min — ABNORMAL LOW (ref >60)
GFR, Estimated: 38 mL/min — ABNORMAL LOW (ref >60)
Glucose, Bld: 201 mg/dL — ABNORMAL HIGH (ref 70–99)
Glucose, Bld: 185 mg/dL — ABNORMAL HIGH (ref 70–99)
Phosphorus: 3.6 mg/dL (ref 2.5–4.6)
Phosphorus: 3.9 mg/dL (ref 2.5–4.6)
Potassium: 4.9 mmol/L (ref 3.5–5.1)
Potassium: 5.1 mmol/L (ref 3.5–5.1)
Sodium: 132 mmol/L — ABNORMAL LOW (ref 135–145)
Sodium: 135 mmol/L (ref 135–145)

## 2021-01-29 LAB — GLUCOSE, CAPILLARY
Glucose-Capillary: 162 mg/dL — ABNORMAL HIGH (ref 70–99)
Glucose-Capillary: 170 mg/dL — ABNORMAL HIGH (ref 70–99)
Glucose-Capillary: 174 mg/dL — ABNORMAL HIGH (ref 70–99)
Glucose-Capillary: 204 mg/dL — ABNORMAL HIGH (ref 70–99)
Glucose-Capillary: 216 mg/dL — ABNORMAL HIGH (ref 70–99)
Glucose-Capillary: 247 mg/dL — ABNORMAL HIGH (ref 70–99)

## 2021-01-29 LAB — CBC
HCT: 26.4 % — ABNORMAL LOW (ref 39.0–52.0)
Hemoglobin: 8.2 g/dL — ABNORMAL LOW (ref 13.0–17.0)
MCH: 26.3 pg (ref 26.0–34.0)
MCHC: 31.1 g/dL (ref 30.0–36.0)
MCV: 84.6 fL (ref 80.0–100.0)
Platelets: 157 10*3/uL (ref 150–400)
RBC: 3.12 MIL/uL — ABNORMAL LOW (ref 4.22–5.81)
RDW: 17.9 % — ABNORMAL HIGH (ref 11.5–15.5)
WBC: 11.4 10*3/uL — ABNORMAL HIGH (ref 4.0–10.5)
nRBC: 0 % (ref 0.0–0.2)

## 2021-01-29 LAB — APTT
aPTT: 48 s — ABNORMAL HIGH (ref 24–36)
aPTT: 49 s — ABNORMAL HIGH (ref 24–36)
aPTT: 53 s — ABNORMAL HIGH (ref 24–36)

## 2021-01-29 LAB — MAGNESIUM: Magnesium: 2.9 mg/dL — ABNORMAL HIGH (ref 1.7–2.4)

## 2021-01-29 NOTE — Progress Notes (Signed)
Trauma/Critical Care Follow Up Note  Subjective:    Overnight Issues:   Objective:  Vital signs for last 24 hours: Temp:  [98 F (36.7 C)-98.7 F (37.1 C)] 98.7 F (37.1 C) (09/13 0722) Pulse Rate:  [71-129] 75 (09/13 0800) Resp:  [11-26] 19 (09/13 0800) BP: (91-149)/(52-106) 146/62 (09/13 0800) SpO2:  [99 %-100 %] 100 % (09/13 0800) FiO2 (%):  [30 %-40 %] 40 % (09/13 0746) Weight:  [108.7 kg] 108.7 kg (09/13 0500)  Hemodynamic parameters for last 24 hours:    Intake/Output from previous day: 09/12 0701 - 09/13 0700 In: 3129.3 [I.V.:779.3; NG/GT:2255] Out: 5283 [Urine:250; Stool:500]  Intake/Output this shift: Total I/O In: -  Out: 61 [Other:61]  Vent settings for last 24 hours: Vent Mode: PRVC FiO2 (%):  [30 %-40 %] 40 % Set Rate:  [15 bmp] 15 bmp Vt Set:  [570 mL] 570 mL PEEP:  [5 cmH20] 5 cmH20 Pressure Support:  [5 cmH20] 5 cmH20 Plateau Pressure:  [18 cmH20-22 cmH20] 18 cmH20  Physical Exam:  Gen: comfortable, no distress Neuro: non-focal exam HEENT: PERRL Neck: supple CV: RRR Pulm: unlabored breathing Abd: soft, NT GU: CRRT Extr: wwp, no edema   Results for orders placed or performed during the hospital encounter of 12/28/20 (from the past 24 hour(s))  Glucose, capillary     Status: Abnormal   Collection Time: 01/28/21 11:49 AM  Result Value Ref Range   Glucose-Capillary 170 (H) 70 - 99 mg/dL  Glucose, capillary     Status: Abnormal   Collection Time: 01/28/21  3:11 PM  Result Value Ref Range   Glucose-Capillary 203 (H) 70 - 99 mg/dL  Renal function panel (daily at 1600)     Status: Abnormal   Collection Time: 01/28/21  3:25 PM  Result Value Ref Range   Sodium 132 (L) 135 - 145 mmol/L   Potassium 4.9 3.5 - 5.1 mmol/L   Chloride 97 (L) 98 - 111 mmol/L   CO2 22 22 - 32 mmol/L   Glucose, Bld 217 (H) 70 - 99 mg/dL   BUN 73 (H) 8 - 23 mg/dL   Creatinine, Ser 2.26 (H) 0.61 - 1.24 mg/dL   Calcium 8.8 (L) 8.9 - 10.3 mg/dL   Phosphorus 4.9 (H)  2.5 - 4.6 mg/dL   Albumin 2.4 (L) 3.5 - 5.0 g/dL   GFR, Estimated 30 (L) >60 mL/min   Anion gap 13 5 - 15  Glucose, capillary     Status: Abnormal   Collection Time: 01/28/21  7:39 PM  Result Value Ref Range   Glucose-Capillary 169 (H) 70 - 99 mg/dL  Glucose, capillary     Status: Abnormal   Collection Time: 01/28/21 11:34 PM  Result Value Ref Range   Glucose-Capillary 192 (H) 70 - 99 mg/dL  APTT     Status: Abnormal   Collection Time: 01/29/21  3:17 AM  Result Value Ref Range   aPTT 48 (H) 24 - 36 seconds  Renal function panel (daily at 0500)     Status: Abnormal   Collection Time: 01/29/21  3:17 AM  Result Value Ref Range   Sodium 132 (L) 135 - 145 mmol/L   Potassium 4.9 3.5 - 5.1 mmol/L   Chloride 96 (L) 98 - 111 mmol/L   CO2 25 22 - 32 mmol/L   Glucose, Bld 201 (H) 70 - 99 mg/dL   BUN 64 (H) 8 - 23 mg/dL   Creatinine, Ser 1.94 (H) 0.61 - 1.24 mg/dL   Calcium  9.0 8.9 - 10.3 mg/dL   Phosphorus 3.6 2.5 - 4.6 mg/dL   Albumin 2.5 (L) 3.5 - 5.0 g/dL   GFR, Estimated 37 (L) >60 mL/min   Anion gap 11 5 - 15  Magnesium     Status: Abnormal   Collection Time: 01/29/21  3:17 AM  Result Value Ref Range   Magnesium 2.9 (H) 1.7 - 2.4 mg/dL  CBC     Status: Abnormal   Collection Time: 01/29/21  3:17 AM  Result Value Ref Range   WBC 11.4 (H) 4.0 - 10.5 K/uL   RBC 3.12 (L) 4.22 - 5.81 MIL/uL   Hemoglobin 8.2 (L) 13.0 - 17.0 g/dL   HCT 26.4 (L) 39.0 - 52.0 %   MCV 84.6 80.0 - 100.0 fL   MCH 26.3 26.0 - 34.0 pg   MCHC 31.1 30.0 - 36.0 g/dL   RDW 17.9 (H) 11.5 - 15.5 %   Platelets 157 150 - 400 K/uL   nRBC 0.0 0.0 - 0.2 %  Glucose, capillary     Status: Abnormal   Collection Time: 01/29/21  3:49 AM  Result Value Ref Range   Glucose-Capillary 216 (H) 70 - 99 mg/dL  Glucose, capillary     Status: Abnormal   Collection Time: 01/29/21  7:20 AM  Result Value Ref Range   Glucose-Capillary 204 (H) 70 - 99 mg/dL    Assessment & Plan: The plan of care was discussed with the bedside  nurse for the day, who is in agreement with this plan and no additional concerns were raised.   Present on Admission: **None**    LOS: 32 days   Additional comments:I reviewed the patient's new clinical lab test results.   and I reviewed the patients new imaging test results.    Fall down stairs 8/12   VDRF - guaifenisen, wean, S/P trach 8/29 by Dr. Bobbye Morton. Did HTC 4H yesterday, continue as able ID - off abx, no fevers, completed maxipime/ampicillin 8/31, CT A/P with ascending colitis and distention. Stool studies and C. dif are all negative. 9/8 Resp cult Pseud - likely colonized. WBC 11.7 TBI/SAH/SDH - NSGY c/s, Dr. Annette Stable. Significant frontal lobe injuries. Keppra x7d for sz ppx (completed) Occipital bone fx - NSGY c/s, Dr. Annette Stable Temporal bone fx extending into middle ear - ENT c/s, Dr. Constance Holster Right TM Rupture - ENT c/s, Dr. Constance Holster AFRVR -  cardene off, hydral 25q8 ABL anemia - stable Bilateral pulmonary embolism - bivalirudin AKI - did not tolerate HD over the weekend. Back on CRRT per Renal. Trial again hopefully soon as this is the barrier to leaving ICU Hx DM2 - resistant SSI, novolog q4, glargine  50u BID Hx HTN - PRN meds FEN - NPO, hold TF and KUB, Klonopin VTE - SCDs, bival gtt Dispo - ICU, HTC, therapies  Critical Care Total Time: 40 minutes  Jesusita Oka, MD Trauma & General Surgery Please use [email or web address] to contact on call provider  01/29/2021  *Care during the described time interval was provided by me. I have reviewed this patient's available data, including medical history, events of note, physical examination and test results as part of my evaluation.

## 2021-01-29 NOTE — TOC Progression Note (Signed)
Transition of Care [facility]) - Progression Note    Patient Details  Name: Angel [name] MRN: [id number] Date of Birth: Dec 04, 1950  Transition of Care [facility]) CM/SW Contact  Oren Section Cleta Alberts, RN Phone Number: 01/29/2021, 4:59 PM  Clinical Narrative:   Pt continues on CRRT currently.  Note hoping to try intermittent hemodialysis again soon.  Hopeful to be able to consider transition to LTAC once patient tolerating IHD.  Will continue to follow as patient progresses.     Expected Discharge Plan: Long Term Acute Care (LTAC) Barriers to Discharge: Continued Medical Work up  Expected Discharge Plan and Services Expected Discharge Plan: Early (LTAC)   Discharge Planning Services: CM Consult   Living arrangements for the past 2 months: Single Family Home                                       Social Determinants of Health (SDOH) Interventions    Readmission Risk Interventions No flowsheet data found.  Reinaldo Raddle, RN, BSN  Trauma/Neuro ICU Case Manager [phone]

## 2021-01-29 NOTE — Progress Notes (Addendum)
Progress Note  Patient Name: Angel [name] Date of Encounter: 01/29/2021  Primary Cardiologist:   Werner Lean, MD   Subjective   Had afib with RVR last night and received bolus of Amio 128m IV.  Converted to NSR on tele. Communication difficult with trach    Inpatient Medications    Scheduled Meds:  acetaminophen  1,000 mg Per Tube Q6H   chlorhexidine gluconate (MEDLINE KIT)  15 mL Mouth Rinse BID   Chlorhexidine Gluconate Cloth  6 each Topical Q0600   clonazePAM  0.5 mg Per Tube BID   docusate  100 mg Per Tube BID   feeding supplement (PROSource TF)  90 mL Per Tube BID   guaiFENesin  10 mL Per Tube Q4H   hydrALAZINE  25 mg Per Tube Q8H   insulin aspart  0-20 Units Subcutaneous Q4H   insulin aspart  10 Units Subcutaneous Q4H   insulin glargine-yfgn  50 Units Subcutaneous BID   mouth rinse  15 mL Mouth Rinse 10 times per day   methocarbamol  1,000 mg Per Tube Q8H   pantoprazole sodium  40 mg Per Tube Daily   polyethylene glycol  17 g Per Tube Daily   QUEtiapine  100 mg Per Tube BID   senna  1 tablet Per Tube Daily   sodium chloride flush  10-40 mL Intracatheter Q12H   Continuous Infusions:   prismasol BGK 4/2.5 500 mL/hr at 01/29/21 0536   sodium chloride     amiodarone 30 mg/hr (01/29/21 0700)   bivalirudin (ANGIOMAX) infusion 0.5 mg/mL (Non-ACS indications) 0.02 mg/kg/hr (01/29/21 0700)   feeding supplement (PIVOT 1.5 CAL) 1,000 mL (01/28/21 1500)   prismasol BGK 2/2.5 replacement solution 300 mL/hr at 01/29/21 0534   prismasol BGK 4/2.5 1,500 mL/hr at 01/29/21 0558   PRN Meds: Place/Maintain arterial line **AND** sodium chloride, artificial tears, heparin, hydrALAZINE, HYDROmorphone (DILAUDID) injection, midazolam, ondansetron **OR** ondansetron (ZOFRAN) IV, oxyCODONE, sodium chloride flush   Vital Signs    Vitals:   01/29/21 0700 01/29/21 0722 01/29/21 0746 01/29/21 0800  BP: 128/63   (!) 146/62  Pulse: 76  75 75  Resp: _0 Temp:  98.7 F  (37.1 C)    TempSrc:  Oral    SpO2: 100%  100% 100%  Weight:      Height:        Intake/Output Summary (Last 24 hours) at 01/29/2021 0803 Last data filed at 01/29/2021 0700 Gross per 24 hour  Intake 3067.49 ml  Output 5115 ml  Net -2047.51 ml    Filed Weights   01/27/21 1000 01/28/21 0500 01/29/21 0500  Weight: 110 kg 112.3 kg 108.7 kg    Telemetry  NSR- Personally Reviewed  ECG    NA - Personally Reviewed  Physical Exam   GEN: trach, ill appearing HEENT: Normal NECK: No JVD; No carotid bruits LYMPHATICS: No lymphadenopathy CARDIAC:RRR, no murmurs, rubs, gallops RESPIRATORY:  Clear to auscultation without rales, wheezing or rhonchi  ABDOMEN: Soft, non-tender, non-distended MUSCULOSKELETAL:  No edema; No deformity  SKIN: Warm and dry NEUROLOGIC: cannot assess PSYCHIATRIC:  cannot assess Labs    Chemistry Recent Labs  Lab 01/28/21 0319 01/28/21 1525 01/29/21 0317  NA 132* 132* 132*  K 5.2* 4.9 4.9  CL 96* 97* 96*  CO2 _1 GLUCOSE 164* 217* 201*  BUN 87* 73* 64*  CREATININE 2.80* 2.26* 1.94*  CALCIUM 9.0 8.8* 9.0  ALBUMIN 2.4* 2.4* 2.5*  GFRNONAA 24* 30* 37*  ANIONGAP _0 Hematology Recent Labs  Lab 01/27/21 0227 01/28/21 0319 01/29/21 0317  WBC 27.3* 11.7* 11.4*  RBC 3.20* 2.90* 3.12*  HGB 8.3*  9.9* 7.8* 8.2*  HCT 27.0*  29.0* 24.9* 26.4*  MCV 84.4 85.9 84.6  MCH 25.9* 26.9 26.3  MCHC 30.7 31.3 31.1  RDW 19.0* 18.6* 17.9*  PLT 236 164 157     Cardiac EnzymesNo results for input(s): TROPONINI in the last 168 hours. No results for input(s): TROPIPOC in the last 168 hours.   BNPNo results for input(s): BNP, PROBNP in the last 168 hours.   DDimer No results for input(s): DDIMER in the last 168 hours.   Radiology    DG CHEST PORT 1 VIEW  Result Date: 01/28/2021 CLINICAL DATA:  Aspiration. EXAM: PORTABLE CHEST 1 VIEW COMPARISON:  Chest radiograph, 01/23/2021.  CT chest, 01/08/2021. FINDINGS: Support lines: LEFT  subclavian temporary dialysis catheter, with tip at the brachiocephalic venous junction. Tracheostomy with tube tip at the midthoracic trachea. RIGHT upper extremity PICC with the tip within the RIGHT atrium. Enteric feeding tube, with tip excluded from view. Cardiomediastinal silhouette is unchanged. Hypoinflation. Relative decreased appearance of patchy bibasilar opacities. No large pleural effusion. No pneumothorax. No interval osseous abnormality. IMPRESSION: 1. Mildly improved aeration, with relative decreased appearance of bibasilar opacities. 2. Lines and tubes as above. Electronically Signed   By: Michaelle Birks M.D.   On: 01/28/2021 07:44   DG Abd Portable 1V  Result Date: 01/27/2021 CLINICAL DATA:  Vomiting.  Fell down stairs. EXAM: PORTABLE ABDOMEN - 1 VIEW COMPARISON:  01/16/2021 FINDINGS: Normal bowel gas pattern. Feeding tube tip in the mid stomach. No visible free peritoneal air. Lumbar and lower thoracic spine degenerative changes. IMPRESSION: Feeding tube tip in the mid stomach.  No acute abnormality. Electronically Signed   By: Claudie Revering M.D.   On: 01/27/2021 10:22    Cardiac Studies   Echo 01/07/21: 1. Left ventricular ejection fraction, by estimation, is 60 to 65%. The  left ventricle has normal function. The left ventricle has no regional  wall motion abnormalities. There is mild left ventricular hypertrophy.  Left ventricular diastolic parameters  are indeterminate.   2. Right ventricule is poorly visualized but grossly normal size and  systolic function   3. Left atrial size was mildly dilated.   4. Right atrial size was mildly dilated.   5. The mitral valve is normal in structure. No evidence of mitral valve  regurgitation. No evidence of mitral stenosis.   6. The aortic valve was not well visualized. Aortic valve regurgitation  is not visualized. No aortic stenosis is present.   Patient Profile     70 y.o. male with a PMH of hyperlipidemia, DM type II, who presented  with fall resulting in subarachnoid hemorrhage requiring intubation with hospital course complicated by Multi lobar pneumonia, PE, sepsis, AKI requiring CRRT, and new onset atrial fibrillation/flutter for which cardiology is following.  Assessment & Plan    New onset paroxysmal atrial flutter:    -Atrial fib with rapid rate in the 130's last night and got Amio bolus -now converted to NSR -On angiomax.     -Transition to PO anticoag when OK with primary team and no further invasive procedures planned.    -continue IV Amio gtt for now  -hypotension limits use of BB and CCB  Hypoxic respiratory failure:  -Hospital course complicated by multilobar PNA and bilateral PE -Continue vent management per primary team.  AKI:   -Unable to tolerate intermittent HD.   -Now on CRRT.     For questions or updates, please contact [place] Please consult [email or web address] for contact info under Cardiology/STEMI.   Signed, Fransico Him, MD  01/29/2021, 8:03 AM

## 2021-01-29 NOTE — Progress Notes (Signed)
[facility] NEPHROLOGY PROGRESS NOTE  Assessment/ Plan:  #Acute kidney injury, oliguric: Multifactorial etiology including ischemic ATN in the setting of hypotension, sepsis complicated by contrast injury. CRRT from 9/1-9/9.  No heparin as he is on bivalirudin.  The HD catheter was changed on 9/7.  CRRT restarted on 9/11 given elevated BUN and with more confusion, did not tolerated IHD on 9/10 (confusion, hypotension). Will continue CRRT today with a goal UF 100 cc an hour.   -Plan is to continue CRRT for now. If filter clots/expires then do not restart CRRT. If he is still on CRRT by tomorrow morning then will likely rinse him back. Will be attempting IHD again soon. Discussed with RN. -on Angiomax. -Continue to monitor strict ins and out, daily lab.  #Fall/bilateral subarachnoid hemorrhage/SDH, TBI/occipital and temporal bone fracture: Per trauma team.  Repeat CT scan with no acute finding.  #Acute respiratory failure: Status post trach on 8/29 and on vent.  #A. fib with RVR: On amiodarone, Angiomax.  # Anemia of critical illness: Transfuse as needed.  #Metabolic acidosis: Managed with dialysis.   #Bilateral pulm embolism: Currently on anticoagulation.  #Acute febrile illness: Per primary team.  #Hyperkalemia: on CRRT  Discussed with RN.  Subjective: Seen and examined on CRRT. Afib w/ rvr overnight, required amio bolus. Tolerating crrt, currently on trach collar. Filter just changed  Objective Vital signs in last 24 hours: Vitals:   01/29/21 0700 01/29/21 0722 01/29/21 0746 01/29/21 0800  BP: 128/63   (!) 146/62  Pulse: 76  75 75  Resp: 16  18 19  Temp:  98.7 F (37.1 C)    TempSrc:  Oral    SpO2: 100%  100% 100%  Weight:      Height:       Weight change: -1.3 kg  Intake/Output Summary (Last 24 hours) at 01/29/2021 0914 Last data filed at 01/29/2021 0900 Gross per 24 hour  Intake 2995.64 ml  Output 5027 ml  Net -2031.36 ml       Labs: Basic  Metabolic Panel: Recent Labs  Lab 01/28/21 0319 01/28/21 1525 01/29/21 0317  NA 132* 132* 132*  K 5.2* 4.9 4.9  CL 96* 97* 96*  CO2 22 22 25  GLUCOSE 164* 217* 201*  BUN 87* 73* 64*  CREATININE 2.80* 2.26* 1.94*  CALCIUM 9.0 8.8* 9.0  PHOS 6.6* 4.9* 3.6   Liver Function Tests: Recent Labs  Lab 01/28/21 0319 01/28/21 1525 01/29/21 0317  ALBUMIN 2.4* 2.4* 2.5*   No results for input(s): LIPASE, AMYLASE in the last 168 hours. No results for input(s): AMMONIA in the last 168 hours. CBC: Recent Labs  Lab 01/25/21 0402 01/26/21 0452 01/27/21 0227 01/28/21 0319 01/29/21 0317  WBC 13.5* 14.4* 27.3* 11.7* 11.4*  HGB 8.1* 7.9* 8.3*  9.9* 7.8* 8.2*  HCT 26.1* 24.8* 27.0*  29.0* 24.9* 26.4*  MCV 85.0 84.4 84.4 85.9 84.6  PLT 191 186 236 164 157   Cardiac Enzymes: No results for input(s): CKTOTAL, CKMB, CKMBINDEX, TROPONINI in the last 168 hours. CBG: Recent Labs  Lab 01/28/21 1511 01/28/21 1939 01/28/21 2334 01/29/21 0349 01/29/21 0720  GLUCAP 203* 169* 192* 216* 204*    Iron Studies: No results for input(s): IRON, TIBC, TRANSFERRIN, FERRITIN in the last 72 hours. Studies/Results: DG CHEST PORT 1 VIEW  Result Date: 01/28/2021 CLINICAL DATA:  Aspiration. EXAM: PORTABLE CHEST 1 VIEW COMPARISON:  Chest radiograph, 01/23/2021.  CT chest, 01/08/2021. FINDINGS: Support lines: LEFT subclavian temporary dialysis catheter, with tip at   the brachiocephalic venous junction. Tracheostomy with tube tip at the midthoracic trachea. RIGHT upper extremity PICC with the tip within the RIGHT atrium. Enteric feeding tube, with tip excluded from view. Cardiomediastinal silhouette is unchanged. Hypoinflation. Relative decreased appearance of patchy bibasilar opacities. No large pleural effusion. No pneumothorax. No interval osseous abnormality. IMPRESSION: 1. Mildly improved aeration, with relative decreased appearance of bibasilar opacities. 2. Lines and tubes as above. Electronically Signed    By: Jon  Mugweru M.D.   On: 01/28/2021 07:44   DG Abd Portable 1V  Result Date: 01/27/2021 CLINICAL DATA:  Vomiting.  Fell down stairs. EXAM: PORTABLE ABDOMEN - 1 VIEW COMPARISON:  01/16/2021 FINDINGS: Normal bowel gas pattern. Feeding tube tip in the mid stomach. No visible free peritoneal air. Lumbar and lower thoracic spine degenerative changes. IMPRESSION: Feeding tube tip in the mid stomach.  No acute abnormality. Electronically Signed   By: Steven  Reid M.D.   On: 01/27/2021 10:22    Medications: Infusions:   prismasol BGK 4/2.5 500 mL/hr at 01/29/21 0536   sodium chloride     amiodarone 30 mg/hr (01/29/21 0700)   bivalirudin (ANGIOMAX) infusion 0.5 mg/mL (Non-ACS indications) 0.02 mg/kg/hr (01/29/21 0700)   feeding supplement (PIVOT 1.5 CAL) 1,000 mL (01/28/21 1500)   prismasol BGK 2/2.5 replacement solution 300 mL/hr at 01/29/21 0534   prismasol BGK 4/2.5 1,500 mL/hr at 01/29/21 0558    Scheduled Medications:  acetaminophen  1,000 mg Per Tube Q6H   chlorhexidine gluconate (MEDLINE KIT)  15 mL Mouth Rinse BID   Chlorhexidine Gluconate Cloth  6 each Topical Q0600   clonazePAM  0.5 mg Per Tube BID   docusate  100 mg Per Tube BID   feeding supplement (PROSource TF)  90 mL Per Tube BID   guaiFENesin  10 mL Per Tube Q4H   hydrALAZINE  25 mg Per Tube Q8H   insulin aspart  0-20 Units Subcutaneous Q4H   insulin aspart  10 Units Subcutaneous Q4H   insulin glargine-yfgn  50 Units Subcutaneous BID   mouth rinse  15 mL Mouth Rinse 10 times per day   methocarbamol  1,000 mg Per Tube Q8H   pantoprazole sodium  40 mg Per Tube Daily   polyethylene glycol  17 g Per Tube Daily   QUEtiapine  100 mg Per Tube BID   senna  1 tablet Per Tube Daily   sodium chloride flush  10-40 mL Intracatheter Q12H    have reviewed scheduled and prn medications.  Physical Exam: General: nad, tracheostomy on vent. Heart: irreg irreg Lungs: trach, cta bl Abdomen:soft, nontender. Extremities: Only trace  dependent edema. Neurology:Alert awake  Dialysis Access: Left subclavian temporary HD catheter placed on 9/7.    01/29/2021,9:14 AM  LOS: 32 days   

## 2021-01-29 NOTE — Progress Notes (Signed)
Called on call cardiologist, Dr. Conley Canal, and alerted him that the patient was sustaining above the 120 HR in Afib RVR.  Was given orders to give amio bolus.  Will administer and continue to observe and act accordingly.

## 2021-01-29 NOTE — Progress Notes (Signed)
[facility] for bivalirudin Indication: atrial fibrillation, DVT, PE 8/23   No Known Allergies  Patient Measurements: Height: 6\' 2"  (188 cm) Weight: 108.7 kg (239 lb 10.2 oz) IBW/kg (Calculated) : 82.2 Heparin Dosing Weight: 107kg  Vital Signs: Temp: 97.8 F (36.6 C) (09/13 1522) Temp Source: Axillary (09/13 1522) BP: 110/57 (09/13 1600) Pulse Rate: 72 (09/13 1600)  Labs: Recent Labs    01/27/21 0227 01/27/21 1200 01/28/21 0319 01/28/21 1525 01/29/21 0317 01/29/21 1010 01/29/21 1543 01/29/21 1600  HGB 8.3*  9.9*  --  7.8*  --  8.2*  --   --   --   HCT 27.0*  29.0*  --  24.9*  --  26.4*  --   --   --   PLT 236  --  164  --  157  --   --   --   APTT 47*   < > 51*  --  48* 49* 53*  --   CREATININE 3.61*   < > 2.80* 2.26* 1.94*  --   --  1.87*   < > = values in this interval not displayed.     Estimated Creatinine Clearance: 48.2 mL/min (A) (by C-G formula based on SCr of 1.87 mg/dL (H)).   Assessment: 67 YOM presenting s/p fall with TBI/SAH and facial fx, in afib started on amiodarone and now cleared per trauma for full dose anticoagulation. 8/23 patient found to have small acute bilateral PE and age-indeterminate LUE DVT. Pharmacy consulted to dose bivalirudin per Trauma.  Given recent head bleed, will aim for middle of therapeutic range aptt and watch closely for signs and symptoms of bleeding.   Repeat aPTT is therapeutic at 53 seconds.  Goal of Therapy:  Aptt goal ~50-65s per discussion with Trauma  Monitor platelets by anticoagulation protocol: Yes   Plan:  Continue bivalirudin 0.022 mg/kg/hr Monitor daily aPTT, CBC, s/sx bleeding   Arrie Senate, PharmD, BCPS, [facility] Clinical Pharmacist [phone] Please check AMION for all [facility] Pharmacy numbers 01/29/2021

## 2021-01-29 NOTE — Progress Notes (Signed)
Angel [name] for bivalirudin Indication: atrial fibrillation, DVT, PE 8/23   No Known Allergies  Patient Measurements: Height: 6\' 2"  (188 cm) Weight: 108.7 kg (239 lb 10.2 oz) IBW/kg (Calculated) : 82.2 Heparin Dosing Weight: 107kg  Vital Signs: Temp: 98.7 F (37.1 C) (09/13 0722) Temp Source: Oral (09/13 0722) BP: 146/62 (09/13 0800) Pulse Rate: 75 (09/13 0800)  Labs: Recent Labs    01/27/21 0227 01/27/21 1200 01/27/21 1640 01/28/21 0319 01/28/21 1525 01/29/21 0317  HGB 8.3*  9.9*  --   --  7.8*  --  8.2*  HCT 27.0*  29.0*  --   --  24.9*  --  26.4*  PLT 236  --   --  164  --  157  APTT 47* 56*  --  51*  --  48*  CREATININE 3.61*  --    < > 2.80* 2.26* 1.94*   < > = values in this interval not displayed.     Estimated Creatinine Clearance: 46.5 mL/min (A) (by C-G formula based on SCr of 1.94 mg/dL (H)).   Assessment: 52 YOM presenting s/p fall with TBI/SAH and facial fx, in afib started on amiodarone and now cleared per trauma for full dose anticoagulation. 8/23 patient found to have small acute bilateral PE and age-indeterminate LUE DVT. Pharmacy consulted to dose bivalirudin per Trauma.  Given recent head bleed, will aim for middle of therapeutic range aptt and watch closely for signs and symptoms of bleeding.   aPTT this morning is slightly SUBtherapeutic (aPTT 49, goal of 50-65). Hgb low but stable - no bleeding or infusion issues noted at this time.   Goal of Therapy:  Aptt goal ~50-65s per discussion with Trauma  Monitor platelets by anticoagulation protocol: Yes   Plan:  Increase bivalirudin to 0.022 mg/kg/hr Recheck aPTT in ~4h Monitor daily aPTT, CBC, s/sx bleeding F/u long-term plan for anticoagulation as appropriate   Thank you for allowing pharmacy to be a part of this patient's care.  Alycia Rossetti, PharmD, BCPS Clinical Pharmacist Clinical phone for 01/29/2021: [phone] 01/29/2021 9:39 AM    **Pharmacist phone directory can now be found on [email or web address] ([email or web address]).  Listed under [place].

## 2021-01-29 NOTE — Progress Notes (Signed)
Physical Therapy Treatment Patient Details Name: Angel [name] MRN: [id number] DOB: 01/19/1951 Today's Date: 01/29/2021   History of Present Illness JAIVEON SUPPES is a 70 y.o. 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 CT showed R temporal and parietal SAH, SAH anterior frontal lobes  bilaterally. Also sustained right occipital skull fracture, temporal bone fx, right TM rupture, bilateral PE. Intubated 8/12, trach'd 8/29. CRRT 9/1- 9/9.  PMH: DM2, HTN    PT Comments    Pt lethargic upon PT arrival to room and for most of session even when placed in chair position, minimally following commands/opening eyes until very end of session. Pt tolerated LE ROM exercises well, exhibits some signs of discomfort towards end range knee extension.  Pt with x2 coughing bouts during session, passed quickly. RN called to room x1 for CRRT alarm. Plan for progression of mobility once of CRRT and when pt more alert.    Recommendations for follow up therapy are one component of a multi-disciplinary discharge planning process, led by the attending physician.  Recommendations may be updated based on patient status, additional functional criteria and insurance authorization.  Follow Up Recommendations  LTACH     Equipment Recommendations  Other (comment) (TBD)    Recommendations for Other Services       Precautions / Restrictions Precautions Precautions: Fall Restrictions Weight Bearing Restrictions: No     Mobility  Bed Mobility Overal bed mobility: Needs Assistance             General bed mobility comments: chair position only - pt lethargic and not responding to commands consistently    Transfers                    Ambulation/Gait                 Stairs             Wheelchair Mobility    Modified Rankin (Stroke Patients Only) Modified Rankin (Stroke Patients Only) Pre-Morbid Rankin Score: No symptoms Modified Rankin: Severe disability     Balance  Overall balance assessment: Needs assistance   Sitting balance-Leahy Scale: Zero Sitting balance - Comments: requires support of bed in egress position                                    Cognition Arousal/Alertness: Lethargic Behavior During Therapy: Flat affect;Restless Overall Cognitive Status: Difficult to assess                 Rancho Levels of Cognitive Functioning Rancho Los Amigos Scales of Cognitive Functioning: Localized response (difficult to assess)               General Comments: pt very lethargic today, responding very inconsistently to verbal and tactile commands/cues      Exercises General Exercises - Lower Extremity Ankle Circles/Pumps: PROM;Both;20 reps;Supine Short Arc Quad: PROM;Both;20 reps;Supine (hold at terminal knee extension x1-2 seconds) Hip ABduction/ADduction: PROM;Both;20 reps;Supine    General Comments General comments (skin integrity, edema, etc.): chair position for session, reclined slightly from this at end of session (40 degrees HOB)      Pertinent Vitals/Pain Pain Assessment: Faces Faces Pain Scale: Hurts little more Pain Location: terminal knee extension L>R Pain Descriptors / Indicators: Discomfort;Grimacing;Guarding Pain Intervention(s): Limited activity within patient's tolerance;Monitored during session;Repositioned    Home Living  Prior Function            PT Goals (current goals can now be found in the care plan section) Acute Rehab PT Goals Patient Stated Goal: unable to state PT Goal Formulation: With patient/family Time For Goal Achievement: 02/07/21 Potential to Achieve Goals: Fair Progress towards PT goals: Progressing toward goals    Frequency    Min 3X/week      PT Plan Discharge plan needs to be updated    Co-evaluation              AM-PAC PT "6 Clicks" Mobility   Outcome Measure  Help needed turning from your back to your side while in a  flat bed without using bedrails?: Total Help needed moving from lying on your back to sitting on the side of a flat bed without using bedrails?: Total Help needed moving to and from a bed to a chair (including a wheelchair)?: Total Help needed standing up from a chair using your arms (e.g., wheelchair or bedside chair)?: Total Help needed to walk in hospital room?: Total Help needed climbing 3-5 steps with a railing? : Total 6 Click Score: 6    End of Session Equipment Utilized During Treatment: Oxygen (trach collar) Activity Tolerance: Patient limited by fatigue Patient left: in bed;with call bell/phone within reach Nurse Communication: Mobility status PT Visit Diagnosis: Unsteadiness on feet (R26.81);Muscle weakness (generalized) (M62.81);Difficulty in walking, not elsewhere classified (R26.2)     Time: 3559-7416 PT Time Calculation (min) (ACUTE ONLY): 16 min  Charges:  $Therapeutic Exercise: 8-22 mins                    Stacie Glaze, PT DPT Acute Rehabilitation Services Pager [phone]  Office [phone]    Roxine Caddy E Ruffin Pyo 01/29/2021, 3:27 PM

## 2021-01-29 NOTE — Progress Notes (Addendum)
  Speech Language Pathology Treatment: [name] Speaking valve;Cognitive-Linquistic  Patient Details Name: Angel [name] MRN: [id number] DOB: 12/29/1950 Today's Date: 01/29/2021 Time: 3005-1102 SLP Time Calculation (min) (ACUTE ONLY): 11 min  Assessment / Plan / Recommendation Clinical Impression  Today is pt's first day on trach collar and seen with PMV. RN stated he repeatedly coughed prior to therapist arrival, therefore was given Versed and drowsy. Cuff deflated triggering non productive cough. With valve donned he was only able to produce/repeat low intensity/hoarse single word intelligibly. Vocalization interrupted by cough. He has an extra long trach and suspect it is contacting trachea to stimulate cough which was interrupting CRRT. All vitals in normal range. Typically SLP leaves cuff deflated however therapist re-inflated to facilitate adequate CRRT. Recommend PMV with SLP only at present until he is tolerating TC. He was unable to sustain attention and participate in additional cognitive therapy today.   HPI HPI: Angel [name] is a 70 y.o. 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 CT showed R temporal and parietal SAH, SAH anterior frontal lobes  bilaterally. Also sustained right occipital skull fracture, temporal bone fx, right TM rupture, bilateral PE. Intubated 8/12, trach'd 8/29. PMH: DM2, HTN      SLP Plan  Continue with current plan of care       Recommendations  Diet recommendations: NPO Medication Administration: Via alternative means      Patient may use Passy-Muir Speech Valve: During all therapies with supervision PMSV Supervision: Full         Oral Care Recommendations: Oral care QID Follow up Recommendations: Inpatient Rehab SLP Visit Diagnosis: Aphonia (R49.1);Cognitive communication deficit (T11.735) Plan: Continue with current plan of care                       Houston Siren 01/29/2021, 9:44 AM  Orbie Pyo Colvin Caroli.Ed  Risk analyst [phone] Office [phone]

## 2021-01-30 DIAGNOSIS — I4891 Unspecified atrial fibrillation: Secondary | ICD-10-CM | POA: Diagnosis not present

## 2021-01-30 LAB — CBC
HCT: 26.5 % — ABNORMAL LOW (ref 39.0–52.0)
Hemoglobin: 8.2 g/dL — ABNORMAL LOW (ref 13.0–17.0)
MCH: 26.6 pg (ref 26.0–34.0)
MCHC: 30.9 g/dL (ref 30.0–36.0)
MCV: 86 fL (ref 80.0–100.0)
Platelets: 172 10*3/uL (ref 150–400)
RBC: 3.08 MIL/uL — ABNORMAL LOW (ref 4.22–5.81)
RDW: 17.5 % — ABNORMAL HIGH (ref 11.5–15.5)
WBC: 14 10*3/uL — ABNORMAL HIGH (ref 4.0–10.5)
nRBC: 0 % (ref 0.0–0.2)

## 2021-01-30 LAB — RENAL FUNCTION PANEL
Albumin: 2.6 g/dL — ABNORMAL LOW (ref 3.5–5.0)
Albumin: 2.4 g/dL — ABNORMAL LOW (ref 3.5–5.0)
Anion gap: 14 (ref 5–15)
Anion gap: 18 — ABNORMAL HIGH (ref 5–15)
BUN: 99 mg/dL — ABNORMAL HIGH (ref 8–23)
BUN: 134 mg/dL — ABNORMAL HIGH (ref 8–23)
CO2: 22 mmol/L (ref 22–32)
CO2: 20 mmol/L — ABNORMAL LOW (ref 22–32)
Calcium: 9.3 mg/dL (ref 8.9–10.3)
Calcium: 9 mg/dL (ref 8.9–10.3)
Chloride: 96 mmol/L — ABNORMAL LOW (ref 98–111)
Chloride: 95 mmol/L — ABNORMAL LOW (ref 98–111)
Creatinine, Ser: 2.95 mg/dL — ABNORMAL HIGH (ref 0.61–1.24)
Creatinine, Ser: 3.51 mg/dL — ABNORMAL HIGH (ref 0.61–1.24)
GFR, Estimated: 22 mL/min — ABNORMAL LOW (ref >60)
GFR, Estimated: 18 mL/min — ABNORMAL LOW (ref >60)
Glucose, Bld: 194 mg/dL — ABNORMAL HIGH (ref 70–99)
Glucose, Bld: 228 mg/dL — ABNORMAL HIGH (ref 70–99)
Phosphorus: 6.1 mg/dL — ABNORMAL HIGH (ref 2.5–4.6)
Phosphorus: 7.1 mg/dL — ABNORMAL HIGH (ref 2.5–4.6)
Potassium: 5.5 mmol/L — ABNORMAL HIGH (ref 3.5–5.1)
Potassium: 5.7 mmol/L — ABNORMAL HIGH (ref 3.5–5.1)
Sodium: 132 mmol/L — ABNORMAL LOW (ref 135–145)
Sodium: 133 mmol/L — ABNORMAL LOW (ref 135–145)

## 2021-01-30 LAB — GLUCOSE, CAPILLARY
Glucose-Capillary: 165 mg/dL — ABNORMAL HIGH (ref 70–99)
Glucose-Capillary: 189 mg/dL — ABNORMAL HIGH (ref 70–99)
Glucose-Capillary: 194 mg/dL — ABNORMAL HIGH (ref 70–99)
Glucose-Capillary: 210 mg/dL — ABNORMAL HIGH (ref 70–99)
Glucose-Capillary: 212 mg/dL — ABNORMAL HIGH (ref 70–99)
Glucose-Capillary: 221 mg/dL — ABNORMAL HIGH (ref 70–99)

## 2021-01-30 LAB — HEPATITIS B SURFACE ANTIGEN: Hepatitis B Surface Ag: NONREACTIVE

## 2021-01-30 LAB — MAGNESIUM: Magnesium: 3 mg/dL — ABNORMAL HIGH (ref 1.7–2.4)

## 2021-01-30 LAB — APTT: aPTT: 63 s — ABNORMAL HIGH (ref 24–36)

## 2021-01-30 LAB — HEPATITIS B SURFACE ANTIBODY,QUALITATIVE: Hep B S Ab: NONREACTIVE

## 2021-01-30 MED ORDER — ALTEPLASE 2 MG IJ SOLR: 2.0000 mg | Freq: Once | INTRAMUSCULAR | Status: DC | PRN

## 2021-01-30 MED ORDER — AMIODARONE LOAD VIA INFUSION
150.0000 mg | Freq: Once | INTRAVENOUS | Status: AC
  Administered 2021-01-30: 150 mg via INTRAVENOUS
  Filled 2021-01-30: qty 83.34

## 2021-01-30 MED ORDER — INSULIN GLARGINE-YFGN 100 UNIT/ML ~~LOC~~ SOLN
54.0000 [IU] | Freq: Two times a day (BID) | SUBCUTANEOUS | Status: DC
  Administered 2021-01-30 – 2021-01-31 (×3): 54 [IU] via SUBCUTANEOUS
  Filled 2021-01-30 (×4): qty 0.54

## 2021-01-30 MED ORDER — SODIUM CHLORIDE 0.9 % IV SOLN: 100.0000 mL | INTRAVENOUS | Status: DC | PRN

## 2021-01-30 MED ORDER — LIDOCAINE-PRILOCAINE 2.5-2.5 % EX CREA
1.0000 "application " | TOPICAL_CREAM | CUTANEOUS | Status: DC | PRN
  Administered 2021-02-04: 1 via TOPICAL
  Filled 2021-01-30: qty 5

## 2021-01-30 MED ORDER — PROSOURCE TF PO LIQD
45.0000 mL | Freq: Two times a day (BID) | ORAL | Status: DC
  Administered 2021-01-30 – 2021-02-12 (×25): 45 mL
  Filled 2021-01-30 (×26): qty 45

## 2021-01-30 MED ORDER — LIDOCAINE HCL (PF) 1 % IJ SOLN: 5.0000 mL | INTRAMUSCULAR | Status: DC | PRN

## 2021-01-30 MED ORDER — NEPRO/CARBSTEADY PO LIQD
1000.0000 mL | ORAL | Status: DC
  Administered 2021-01-31 – 2021-02-06 (×8): 1000 mL
  Filled 2021-01-30 (×16): qty 1000

## 2021-01-30 MED ORDER — ALBUMIN HUMAN 25 % IV SOLN
12.5000 g | INTRAVENOUS | Status: AC | PRN
  Administered 2021-01-30: 25 g via INTRAVENOUS
  Administered 2021-02-06: 12.5 g via INTRAVENOUS
  Filled 2021-01-30 (×3): qty 50

## 2021-01-30 MED ORDER — PENTAFLUOROPROP-TETRAFLUOROETH EX AERO: 1.0000 "application " | INHALATION_SPRAY | CUTANEOUS | Status: DC | PRN

## 2021-01-30 MED ORDER — ALBUMIN HUMAN 5 % IV SOLN
INTRAVENOUS | Status: AC
  Filled 2021-01-30: qty 250

## 2021-01-30 NOTE — Progress Notes (Signed)
Nutrition Follow-up  DOCUMENTATION CODES:   Obesity unspecified  INTERVENTION:   Tube feeding via Cortrak tube:   Nepro @ 60 ml/h (1440 ml per day) 45 ml ProSource TF BID  Provides 2592 kcal, 138 gm protein, 1094 ml free water daily   NUTRITION DIAGNOSIS:   Inadequate oral intake related to inability to eat as evidenced by NPO status. Ongoing.   GOAL:   Patient will meet greater than or equal to 90% of their needs Met with TF.   MONITOR:   TF tolerance  REASON FOR ASSESSMENT:   Consult, Ventilator Enteral/tube feeding initiation and management  ASSESSMENT:   Pt with PMH of DM and HTN admitted after falling down basement stairs with TBI/SAH/SDH, significant frontal lobe injuries, occipital bone fx, temporal bone fx extending into middle ear, and R TM rupture.    Pt tolerating TC > 24 hours Per Renal no signs of renal recovery, CRRT stopped yesterday and plan for iHD today with goal 1-2 L UF as tolerated Therapy recommends LTACH Weight down to 239 lb  8/15 s/p cortrak placement; tip gastric 8/25 pt vomited, OG placed with 650 ml out, abd tight  8/26 extubated but required re-intubation 2 hours later; Cortrak coiled in pt's mouth post extubation and removed; NG tube placed with tip in distal stomach  8/27 TF resumed 8/29 s/p trach placement  8/31 cortrak placed; tip gastric  9/1 CRRT started   Medications reviewed and include: colace, SSI, 10 units novolog every 4 hours, 54 units semglee BID, protonix, miralax, senna Amiodarone Angiomax   Labs reviewed: Na: 132, K: 5.5BUN 99, Cr: 2.95, PO4 6.1 CBG's: 189-212   UOP: 0 ml  UF: 1530 ml  I&O: -21 L Non-pitting edema  Current TF:  Pivot 1.5 at 65 ml/h  90 ml ProSource TF BID Provides 2500 kcal, 190 gm protein,   Diet Order:   Diet Order             Diet NPO time specified  Diet effective now                   EDUCATION NEEDS:   Not appropriate for education at this time  Skin:  Skin  Assessment: Reviewed RN Assessment  Last BM:  50 ml via rectal tube  Height:   Ht Readings from Last 1 Encounters:  01/26/21 6' 2"  (1.88 m)    Weight:   Wt Readings from Last 1 Encounters:  01/29/21 108.7 kg    BMI:  Body mass index is 30.77 kg/m.  Estimated Nutritional Needs:   Kcal:  2500-2700  Protein:  130-150 grams  Fluid:  > 2 L/day  Lockie Pares., RD, LDN, CNSC See AMiON for contact information

## 2021-01-30 NOTE — Progress Notes (Addendum)
Progress Note  Patient Name: Angel [name] Date of Encounter: 01/30/2021  Primary Cardiologist:   Werner Lean, MD   Subjective   Continues to maintain NSR on IV Amio  Inpatient Medications    Scheduled Meds:  acetaminophen  1,000 mg Per Tube Q6H   chlorhexidine gluconate (MEDLINE KIT)  15 mL Mouth Rinse BID   Chlorhexidine Gluconate Cloth  6 each Topical Q0600   clonazePAM  0.5 mg Per Tube BID   docusate  100 mg Per Tube BID   feeding supplement (PROSource TF)  90 mL Per Tube BID   guaiFENesin  10 mL Per Tube Q4H   hydrALAZINE  25 mg Per Tube Q8H   insulin aspart  0-20 Units Subcutaneous Q4H   insulin aspart  10 Units Subcutaneous Q4H   insulin glargine-yfgn  54 Units Subcutaneous BID   mouth rinse  15 mL Mouth Rinse 10 times per day   methocarbamol  1,000 mg Per Tube Q8H   pantoprazole sodium  40 mg Per Tube Daily   polyethylene glycol  17 g Per Tube Daily   QUEtiapine  100 mg Per Tube BID   senna  1 tablet Per Tube Daily   sodium chloride flush  10-40 mL Intracatheter Q12H   Continuous Infusions:   prismasol BGK 4/2.5 500 mL/hr at 01/29/21 1750   sodium chloride     sodium chloride     sodium chloride     amiodarone 30 mg/hr (01/30/21 0800)   bivalirudin (ANGIOMAX) infusion 0.5 mg/mL (Non-ACS indications) 0.022 mg/kg/hr (01/30/21 0800)   feeding supplement (PIVOT 1.5 CAL) 65 mL/hr at 01/29/21 1900   prismasol BGK 2/2.5 replacement solution 300 mL/hr at 01/29/21 1750   prismasol BGK 4/2.5 1,500 mL/hr at 01/29/21 1600   PRN Meds: Place/Maintain arterial line **AND** sodium chloride, sodium chloride, sodium chloride, alteplase, artificial tears, heparin, hydrALAZINE, HYDROmorphone (DILAUDID) injection, lidocaine (PF), lidocaine-prilocaine, midazolam, ondansetron **OR** ondansetron (ZOFRAN) IV, oxyCODONE, pentafluoroprop-tetrafluoroeth, sodium chloride flush   Vital Signs    Vitals:   01/30/21 0500 01/30/21 0700 01/30/21 0734 01/30/21 0800  BP: (!) 149/61  120/63 120/63 134/70  Pulse: 81 79 78 77  Resp: 20 19 17  (!) 22  Temp:      TempSrc:      SpO2: 99% 99% 100% 100%  Weight:      Height:        Intake/Output Summary (Last 24 hours) at 01/30/2021 0825 Last data filed at 01/30/2021 0800 Gross per 24 hour  Intake 2490.41 ml  Output 1530 ml  Net 960.41 ml    Filed Weights   01/27/21 1000 01/28/21 0500 01/29/21 0500  Weight: 110 kg 112.3 kg 108.7 kg    Telemetry  NSR- Personally Reviewed  ECG    NA - Personally Reviewed  Physical Exam   GEN: ill appearing HEENT: normal NECK: trach present.  Body habitus limits assessment for JVD LYMPHATICS: No lymphadenopathy CARDIAC:RRR, no murmurs, rubs, gallops RESPIRATORY:  Clear to auscultation without rales, wheezing or rhonchi  ABDOMEN: Soft, non-tender, non-distended MUSCULOSKELETAL:  No edema; No deformity  SKIN: Warm and dry NEUROLOGIC:  Cannot assess PSYCHIATRIC:  Cannot assess Labs    Chemistry Recent Labs  Lab 01/29/21 0317 01/29/21 1600 01/30/21 0521  NA 132* 135 132*  K 4.9 5.1 5.5*  CL 96* 97* 96*  CO2 25 24 22   GLUCOSE 201* 185* 194*  BUN 64* 64* 99*  CREATININE 1.94* 1.87* 2.95*  CALCIUM 9.0 9.0 9.3  ALBUMIN 2.5* 2.5*  2.6*  GFRNONAA 37* 38* 22*  ANIONGAP 11 14 14       Hematology Recent Labs  Lab 01/28/21 0319 01/29/21 0317 01/30/21 0521  WBC 11.7* 11.4* 14.0*  RBC 2.90* 3.12* 3.08*  HGB 7.8* 8.2* 8.2*  HCT 24.9* 26.4* 26.5*  MCV 85.9 84.6 86.0  MCH 26.9 26.3 26.6  MCHC 31.3 31.1 30.9  RDW 18.6* 17.9* 17.5*  PLT 164 157 172     Cardiac EnzymesNo results for input(s): TROPONINI in the last 168 hours. No results for input(s): TROPIPOC in the last 168 hours.   BNPNo results for input(s): BNP, PROBNP in the last 168 hours.   DDimer No results for input(s): DDIMER in the last 168 hours.   Radiology    No results found.  Cardiac Studies   Echo 01/07/21: 1. Left ventricular ejection fraction, by estimation, is 60 to 65%. The  left  ventricle has normal function. The left ventricle has no regional  wall motion abnormalities. There is mild left ventricular hypertrophy.  Left ventricular diastolic parameters  are indeterminate.   2. Right ventricule is poorly visualized but grossly normal size and  systolic function   3. Left atrial size was mildly dilated.   4. Right atrial size was mildly dilated.   5. The mitral valve is normal in structure. No evidence of mitral valve  regurgitation. No evidence of mitral stenosis.   6. The aortic valve was not well visualized. Aortic valve regurgitation  is not visualized. No aortic stenosis is present.   Patient Profile     70 y.o. male with a PMH of hyperlipidemia, DM type II, who presented with fall resulting in subarachnoid hemorrhage requiring intubation with hospital course complicated by Multi lobar pneumonia, PE, sepsis, AKI requiring CRRT, and new onset atrial fibrillation/flutter for which cardiology is following.  Assessment & Plan    New onset paroxysmal atrial flutter:    -Atrial fib with rapid rate in the 130's l>>converted to NSR yesterday -maintaining NSR on tele -On angiomax.     -Transition to PO anticoag when OK with primary team and no further invasive procedures planned.    -continue IV Amio gtt for now  -hypotension has limited use of BB or CCB but BP now improved in NSR  Hypoxic respiratory failure:  -Hospital course complicated by multilobar PNA and bilateral PE -Continue vent management per primary team.   AKI:   -Unable to tolerate intermittent HD.   -Now on CRRT.    HTN -BP was soft but nor improved after conversion to NSR -continue Hydralazine 74m q8 hours  For questions or updates, please contact [place]Please consult [email or web address] for contact info under Cardiology/STEMI.   Signed, TFransico Him MD  01/30/2021, 8:25 AM

## 2021-01-30 NOTE — Progress Notes (Signed)
Occupational Therapy Treatment Patient Details Name: Angel [name] MRN: [id number] DOB: 22-May-1950 Today's Date: 01/30/2021   History of present illness DICKSON KOSTELNIK is a 70 y.o. 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 CT showed R temporal and parietal SAH, SAH anterior frontal lobes  bilaterally. Also sustained right occipital skull fracture, temporal bone fx, right TM rupture, bilateral PE. Intubated 8/12, trach'd 8/29. CRRT 9/1- 9/9.  PMH: DM2, HTN   OT comments  Pt progressed to static sitting eob with total (A) with decreased BP with ace wraps on bil LE. The BP is obtained via L calf supine after session. Pt tolerated >10 minutes. Pt following simple commands Recommend continued therapy in the next venue    Recommendations for follow up therapy are one component of a multi-disciplinary discharge planning process, led by the attending physician.  Recommendations may be updated based on patient status, additional functional criteria and insurance authorization.    Follow Up Recommendations  LTACH    Equipment Recommendations  3 in 1 bedside commode;Wheelchair (measurements OT);Wheelchair cushion (measurements OT)    Recommendations for Other Services      Precautions / Restrictions Precautions Precautions: Fall Precaution Comments: trach collar cortrak watch BP / HR, flexiseal       Mobility Bed Mobility Overal bed mobility: Needs Assistance Bed Mobility: Rolling;Sit to Supine;Supine to Sit Rolling: +2 for physical assistance;Max assist   Supine to sit: +2 for physical assistance;Total assist Sit to supine: +2 for physical assistance;Total assist   General bed mobility comments: pt moving R LE toward EOB initially on commands. pt needs (A)to progress fully to static sitting depending on therapist. pt pushing at a few points with L UE. pt requires total (A) for bil LE back onto bed surface.    Transfers                 General transfer comment: sitting eob  for the first time    Balance Overall balance assessment: Needs assistance Sitting-balance support: Bilateral upper extremity supported;Feet supported Sitting balance-Leahy Scale: Zero                                     ADL either performed or assessed with clinical judgement   ADL Overall ADL's : Needs assistance/impaired Eating/Feeding: NPO   Grooming: Maximal assistance Grooming Details (indicate cue type and reason): hand over hand oral care. noted to have very white tongue Upper Body Bathing: Total assistance   Lower Body Bathing: Total assistance   Upper Body Dressing : Total assistance   Lower Body Dressing: Total assistance Lower Body Dressing Details (indicate cue type and reason): did initiate lifting R LE for don of sock               General ADL Comments: progresed to eob and static sitting this session noted to have ortho static BP with ace wraps bil LE to knee     Vision   Additional Comments: difficult to assess   Perception     Praxis      Cognition Arousal/Alertness: Awake/alert Behavior During Therapy: Flat affect Overall Cognitive Status: Difficult to assess Area of Impairment: Rancho level               Rancho Levels of Cognitive Functioning [place] [employer] Scales of Cognitive Functioning: Confused/inappropriate/non-agitated               General  Comments: pt following simple commands thrumbs up, two fingers moving R side to commands L ue to commands. kicking R LE, Pt mouthing responses. pt states "no when asked about pain. PMV not applied this session so unable to get more cognitive information.        Exercises General Exercises - Upper Extremity Shoulder Flexion: AROM;Right;Left;10 reps;Seated Elbow Flexion: AROM;Both;10 reps;Seated Other Exercises Other Exercises: AAROM digits bil UE, wrist and elbow   Shoulder Instructions       General Comments BP decreased with static sitting during session . HR  stable    Pertinent Vitals/ Pain       Pain Assessment: No/denies pain  Home Living                                          Prior Functioning/Environment              Frequency  Min 2X/week        Progress Toward Goals  OT Goals(current goals can now be found in the care plan section)  Progress towards OT goals: Progressing toward goals  Acute Rehab OT Goals Patient Stated Goal: none stated by patient. no family present at this tiem OT Goal Formulation: Patient unable to participate in goal setting Time For Goal Achievement: 02/13/21 Potential to Achieve Goals: Fair ADL Goals Additional ADL Goal #1: pt will follow 2 step comamnds 50% of session Additional ADL Goal #2: pt will visually track to the R side 50% of session Additional ADL Goal #3: pt will static sit eob mod (A) for 10 minutes with stable VSS  Plan Discharge plan needs to be updated    Co-evaluation    PT/OT/SLP Co-Evaluation/Treatment: Yes Reason for Co-Treatment: Complexity of the patient's impairments (multi-system involvement);Necessary to address cognition/behavior during functional activity;For patient/therapist safety;To address functional/ADL transfers   OT goals addressed during session: ADL's and self-care;Proper use of Adaptive equipment and DME;Strengthening/ROM      AM-PAC OT "6 Clicks" Daily Activity     Outcome Measure   Help from another person eating meals?: A Lot Help from another person taking care of personal grooming?: A Lot Help from another person toileting, which includes using toliet, bedpan, or urinal?: Total Help from another person bathing (including washing, rinsing, drying)?: Total Help from another person to put on and taking off regular upper body clothing?: A Lot Help from another person to put on and taking off regular lower body clothing?: Total 6 Click Score: 9    End of Session Equipment Utilized During Treatment: Oxygen  OT Visit  Diagnosis: Unsteadiness on feet (R26.81);Other abnormalities of gait and mobility (R26.89);Muscle weakness (generalized) (M62.81);Other symptoms and signs involving cognitive function;Pain   Activity Tolerance Patient tolerated treatment well   Patient Left in bed;with call bell/phone within reach;with bed alarm set   Nurse Communication Mobility status;Precautions;Need for lift equipment        Time: [phone] OT Time Calculation (min): 33 min  Charges: OT General Charges $OT Visit: 1 Visit OT Treatments $Therapeutic Activity: 8-22 mins   Brynn, OTR/L  Acute Rehabilitation Services Pager: [phone] Office: [phone] .   Jeri Modena 01/30/2021, 10:50 AM

## 2021-01-30 NOTE — Progress Notes (Signed)
Patient ID: Angel [name], male   DOB: 03-21-1951, 70 y.o.   MRN: [id number] Follow up - Trauma Critical Care  Patient Details:    Angel [name] is an 70 y.o. male.  Lines/tubes : PICC Triple Lumen 29/93/71 PICC Right Basilic 47 cm 1 cm (Active)  Indication for Insertion or Continuance of Line Prolonged intravenous therapies;Limited venous access - need for IV therapy >5 days (PICC only) 01/29/21 2000  Exposed Catheter (cm) 1 cm 01/09/21 0916  Site Assessment Clean;Dry;Intact 01/29/21 2000  Lumen #1 Status Infusing 01/29/21 2000  Lumen #2 Status Infusing 01/29/21 2000  Lumen #3 Status In-line blood sampling system in place;Flushed;Blood return noted 01/29/21 2000  Dressing Type Transparent 01/29/21 2000  Dressing Status Clean;Dry;Intact 01/29/21 2000  Antimicrobial disc in place? Yes 01/29/21 2000  Safety Lock Not Applicable 69/67/89 3810  Line Care Connections checked and tightened 01/29/21 2000  Dressing Intervention Other (Comment) 01/26/21 2100  Dressing Change Due 01/31/21 01/29/21 2000     External Urinary Catheter (Active)  Collection Container Dedicated Suction Canister 01/29/21 2000  Suction (Verified suction is between 40-80 mmHg) Yes 01/29/21 2000  Securement Method Tape 01/29/21 2000  Site Assessment Clean;Intact 01/29/21 2000  Intervention Male External Urinary Catheter Replaced 01/28/21 2000  Output (mL) 50 mL 01/29/21 0023     Fecal Management System (Active)  Does patient meet criteria for removal? No 01/29/21 2000  Daily care Skin around tube assessed 01/29/21 2000  Output (mL) 200 mL 01/29/21 0404  Intake (mL) 85 mL 01/28/21 1641    Microbiology/Sepsis markers: Results for orders placed or performed during the hospital encounter of 12/28/20  Resp Panel by RT-PCR (Flu A&B, Covid) Nasopharyngeal Swab     Status: None   Collection Time: 12/28/20  4:17 PM   Specimen: Nasopharyngeal Swab; Nasopharyngeal(NP) swabs in vial transport medium  Result Value Ref Range  Status   SARS Coronavirus 2 by RT PCR NEGATIVE NEGATIVE Final    Comment: (NOTE) SARS-CoV-2 target nucleic acids are NOT DETECTED.  The SARS-CoV-2 RNA is generally detectable in upper respiratory specimens during the acute phase of infection. The lowest concentration of SARS-CoV-2 viral copies this assay can detect is 138 copies/mL. A negative result does not preclude SARS-Cov-2 infection and should not be used as the sole basis for treatment or other patient management decisions. A negative result may occur with  improper specimen collection/handling, submission of specimen other than nasopharyngeal swab, presence of viral mutation(s) within the areas targeted by this assay, and inadequate number of viral copies(<138 copies/mL). A negative result must be combined with clinical observations, patient history, and epidemiological information. The expected result is Negative.  Fact Sheet for Patients:  EntrepreneurPulse.com.au  Fact Sheet for Healthcare Providers:  IncredibleEmployment.be  This test is no t yet approved or cleared by the [place] FDA and  has been authorized for detection and/or diagnosis of SARS-CoV-2 by FDA under an Emergency Use Authorization (EUA). This EUA will remain  in effect (meaning this test can be used) for the duration of the COVID-19 declaration under Section 564(b)(1) of the Act, 21 U.S.C.section 360bbb-3(b)(1), unless the authorization is terminated  or revoked sooner.       Influenza A by PCR NEGATIVE NEGATIVE Final   Influenza B by PCR NEGATIVE NEGATIVE Final    Comment: (NOTE) The Xpert Xpress SARS-CoV-2/FLU/RSV plus assay is intended as an aid in the diagnosis of influenza from Nasopharyngeal swab specimens and should not be used as a sole basis for treatment. Nasal  washings and aspirates are unacceptable for Xpert Xpress SARS-CoV-2/FLU/RSV testing.  Fact Sheet for  Patients: EntrepreneurPulse.com.au  Fact Sheet for Healthcare Providers: IncredibleEmployment.be  This test is not yet approved or cleared by the [place] FDA and has been authorized for detection and/or diagnosis of SARS-CoV-2 by FDA under an Emergency Use Authorization (EUA). This EUA will remain in effect (meaning this test can be used) for the duration of the COVID-19 declaration under Section 564(b)(1) of the Act, 21 U.S.C. section 360bbb-3(b)(1), unless the authorization is terminated or revoked.  Performed at [facility], [place] [address], [place], [place] [address]   MRSA Next Gen by PCR, Nasal     Status: None   Collection Time: 12/28/20  7:32 PM   Specimen: Nasal Mucosa; Nasal Swab  Result Value Ref Range Status   MRSA by PCR Next Gen NOT DETECTED NOT DETECTED Final    Comment: (NOTE) The GeneXpert MRSA Assay (FDA approved for NASAL specimens only), is one component of a comprehensive MRSA colonization surveillance program. It is not intended to diagnose MRSA infection nor to guide or monitor treatment for MRSA infections. Test performance is not FDA approved in patients less than 39 years old. Performed at [facility], [place] [address] [place], [place] [address]   Culture, Respiratory w Gram Stain     Status: None   Collection Time: 12/31/20 11:06 AM   Specimen: Tracheal Aspirate; Respiratory  Result Value Ref Range Status   Specimen Description TRACHEAL ASPIRATE  Final   Special Requests NONE  Final   Gram Stain   Final    FEW SQUAMOUS EPITHELIAL CELLS PRESENT FEW WBC PRESENT,BOTH PMN AND MONONUCLEAR FEW GRAM POSITIVE COCCI Performed at [facility], [place] [address], [place], [place] [address]    Culture   Final    FEW PSEUDOMONAS AERUGINOSA FEW STREPTOCOCCUS PNEUMONIAE    Report Status 01/03/2021 FINAL  Final   Organism ID, Bacteria PSEUDOMONAS AERUGINOSA  Final   Organism ID, Bacteria STREPTOCOCCUS  PNEUMONIAE  Final      Susceptibility   Pseudomonas aeruginosa - MIC*    CEFTAZIDIME 4 SENSITIVE Sensitive     CIPROFLOXACIN <=0.25 SENSITIVE Sensitive     GENTAMICIN <=1 SENSITIVE Sensitive     IMIPENEM 2 SENSITIVE Sensitive     PIP/TAZO 8 SENSITIVE Sensitive     CEFEPIME 2 SENSITIVE Sensitive     * FEW PSEUDOMONAS AERUGINOSA   Streptococcus pneumoniae - MIC*    ERYTHROMYCIN 4 RESISTANT Resistant     LEVOFLOXACIN 0.5 SENSITIVE Sensitive     VANCOMYCIN <=0.12 SENSITIVE Sensitive     PENO - penicillin <=0.06      PENICILLIN (non-meningitis) <=0.06 SENSITIVE Sensitive     PENICILLIN (oral) <=0.06 SENSITIVE Sensitive     CEFTRIAXONE (non-meningitis) <=0.12 SENSITIVE Sensitive     * FEW STREPTOCOCCUS PNEUMONIAE  Culture, blood (routine x 2)     Status: None   Collection Time: 01/05/21 11:44 AM   Specimen: BLOOD  Result Value Ref Range Status   Specimen Description BLOOD SITE NOT SPECIFIED  Final   Special Requests AEROBIC BOTTLE ONLY Blood Culture adequate volume  Final   Culture   Final    NO GROWTH 5 DAYS Performed at [facility], [address] [place], [place] [address]    Report Status 01/10/2021 FINAL  Final  Culture, blood (routine x 2)     Status: None   Collection Time: 01/05/21 11:44 AM   Specimen: BLOOD  Result Value Ref Range Status   Specimen Description BLOOD SITE NOT SPECIFIED  Final   Special Requests   Final    AEROBIC BOTTLE ONLY Blood Culture results may not be optimal due to an inadequate volume of blood received in culture bottles   Culture   Final    NO GROWTH 5 DAYS Performed at [facility], [place] [address] [place], [place] [address]    Report Status 01/10/2021 FINAL  Final  Surgical PCR screen     Status: None   Collection Time: 01/08/21 12:14 AM   Specimen: Nasal Mucosa; Nasal Swab  Result Value Ref Range Status   MRSA, PCR NEGATIVE NEGATIVE Final   Staphylococcus aureus NEGATIVE NEGATIVE Final    Comment: (NOTE) The Xpert SA Assay  (FDA approved for NASAL specimens in patients 53 years of age and older), is one component of a comprehensive surveillance program. It is not intended to diagnose infection nor to guide or monitor treatment. Performed at [facility], [place] [address] [place], [place] [address]   Culture, Respiratory w Gram Stain     Status: None   Collection Time: 01/08/21  1:24 PM   Specimen: Tracheal Aspirate; Respiratory  Result Value Ref Range Status   Specimen Description TRACHEAL ASPIRATE  Final   Special Requests NONE  Final   Gram Stain   Final    RARE SQUAMOUS EPITHELIAL CELLS PRESENT MODERATE WBC PRESENT, PREDOMINANTLY MONONUCLEAR FEW GRAM NEGATIVE RODS Performed at [facility], [place] [address], [place], [place] [address]    Culture   Final    RARE PSEUDOMONAS AERUGINOSA RARE ENTEROCOCCUS FAECALIS    Report Status 01/11/2021 FINAL  Final   Organism ID, Bacteria PSEUDOMONAS AERUGINOSA  Final   Organism ID, Bacteria ENTEROCOCCUS FAECALIS  Final      Susceptibility   Enterococcus faecalis - MIC*    AMPICILLIN <=2 SENSITIVE Sensitive     VANCOMYCIN 1 SENSITIVE Sensitive     GENTAMICIN SYNERGY SENSITIVE Sensitive     * RARE ENTEROCOCCUS FAECALIS   Pseudomonas aeruginosa - MIC*    CEFTAZIDIME 4 SENSITIVE Sensitive     CIPROFLOXACIN <=0.25 SENSITIVE Sensitive     GENTAMICIN <=1 SENSITIVE Sensitive     IMIPENEM 2 SENSITIVE Sensitive     PIP/TAZO 8 SENSITIVE Sensitive     CEFEPIME 2 SENSITIVE Sensitive     * RARE PSEUDOMONAS AERUGINOSA  Gastrointestinal Panel by PCR , Stool     Status: None   Collection Time: 01/08/21  5:04 PM   Specimen: Stool  Result Value Ref Range Status   Campylobacter species NOT DETECTED NOT DETECTED Final   Plesimonas shigelloides NOT DETECTED NOT DETECTED Final   Salmonella species NOT DETECTED NOT DETECTED Final   Yersinia enterocolitica NOT DETECTED NOT DETECTED Final   Vibrio species NOT DETECTED NOT DETECTED Final   Vibrio cholerae NOT  DETECTED NOT DETECTED Final   Enteroaggregative E coli (EAEC) NOT DETECTED NOT DETECTED Final   Enteropathogenic E coli (EPEC) NOT DETECTED NOT DETECTED Final   Enterotoxigenic E coli (ETEC) NOT DETECTED NOT DETECTED Final   Shiga like toxin producing E coli (STEC) NOT DETECTED NOT DETECTED Final   Shigella/Enteroinvasive E coli (EIEC) NOT DETECTED NOT DETECTED Final   Cryptosporidium NOT DETECTED NOT DETECTED Final   Cyclospora cayetanensis NOT DETECTED NOT DETECTED Final   Entamoeba histolytica NOT DETECTED NOT DETECTED Final   Giardia lamblia NOT DETECTED NOT DETECTED Final   Adenovirus F40/41 NOT DETECTED NOT  DETECTED Final   Astrovirus NOT DETECTED NOT DETECTED Final   Norovirus GI/GII NOT DETECTED NOT DETECTED Final   Rotavirus A NOT DETECTED NOT DETECTED Final   Sapovirus (I, II, IV, and V) NOT DETECTED NOT DETECTED Final    Comment: Performed at [facility], [place], [place] [address]  C Difficile Quick Screen (NO PCR Reflex)     Status: None   Collection Time: 01/09/21 11:07 AM   Specimen: STOOL  Result Value Ref Range Status   C Diff antigen NEGATIVE NEGATIVE Final   C Diff toxin NEGATIVE NEGATIVE Final   C Diff interpretation No C. difficile detected.  Final    Comment: Performed at [facility], [place] [address], [place], [place] [address]  Culture, Respiratory w Gram Stain     Status: None   Collection Time: 01/24/21  8:57 AM   Specimen: Tracheal Aspirate; Respiratory  Result Value Ref Range Status   Specimen Description TRACHEAL ASPIRATE  Final   Special Requests NONE  Final   Gram Stain   Final    ABUNDANT WBC PRESENT, PREDOMINANTLY MONONUCLEAR RARE FEW GRAM NEGATIVE RODS    Culture   Final    ABUNDANT PSEUDOMONAS AERUGINOSA Two isolates with different morphologies were identified as the same organism.The most resistant organism was reported. Performed at [facility], [place] [address] [place], [place] [address]    Report  Status 01/27/2021 FINAL  Final   Organism ID, Bacteria PSEUDOMONAS AERUGINOSA  Final      Susceptibility   Pseudomonas aeruginosa - MIC*    CEFTAZIDIME 16 INTERMEDIATE Intermediate     CIPROFLOXACIN 1 SENSITIVE Sensitive     GENTAMICIN <=1 SENSITIVE Sensitive     IMIPENEM 2 SENSITIVE Sensitive     * ABUNDANT PSEUDOMONAS AERUGINOSA    Anti-infectives:  Anti-infectives (From admission, onward)    Start     Dose/Rate Route Frequency Ordered Stop   01/11/21 2330  ceFEPIme (MAXIPIME) 2 g in sodium chloride 0.9 % 100 mL IVPB  Status:  Discontinued        2 g 200 mL/hr over 30 Minutes Intravenous Every 24 hours 01/11/21 0711 01/16/21 0907   01/10/21 1645  ampicillin (OMNIPEN) 2 g in sodium chloride 0.9 % 100 mL IVPB  Status:  Discontinued        2 g 300 mL/hr over 20 Minutes Intravenous Every 8 hours 01/10/21 1549 01/16/21 0907   01/09/21 2200  ceFEPIme (MAXIPIME) 2 g in sodium chloride 0.9 % 100 mL IVPB  Status:  Discontinued        2 g 200 mL/hr over 30 Minutes Intravenous Every 12 hours 01/09/21 1458 01/11/21 0711   01/08/21 1515  metroNIDAZOLE (FLAGYL) IVPB 500 mg  Status:  Discontinued        500 mg 100 mL/hr over 60 Minutes Intravenous Every 8 hours 01/08/21 1428 01/10/21 1618   01/03/21 0600  vancomycin (VANCOREADY) IVPB 1250 mg/250 mL  Status:  Discontinued        1,250 mg 166.7 mL/hr over 90 Minutes Intravenous Every 12 hours 01/02/21 1717 01/03/21 0837   01/02/21 1800  vancomycin (VANCOREADY) IVPB 2000 mg/400 mL        2,000 mg 200 mL/hr over 120 Minutes Intravenous  Once 01/02/21 1712 01/02/21 2007   01/02/21 0900  ceFEPIme (MAXIPIME) 2 g in sodium chloride 0.9 % 100 mL IVPB  Status:  Discontinued        2 g 200 mL/hr over  30 Minutes Intravenous Every 8 hours 01/02/21 0849 01/09/21 1458       Best Practice/Protocols:  VTE Prophylaxis: Direct Thrombin Inhibitor .  Consults: Treatment Team:  Georganna Skeans, MD Roney Jaffe, MD     Studies:    Events:  Subjective:    Overnight Issues:   Objective:  Vital signs for last 24 hours: Temp:  [97.8 F (36.6 C)-98.4 F (36.9 C)] 98.2 F (36.8 C) (09/14 0400) Pulse Rate:  [68-84] 78 (09/14 0734) Resp:  [11-25] 17 (09/14 0734) BP: (110-160)/(52-69) 120/63 (09/14 0734) SpO2:  [98 %-100 %] 100 % (09/14 0734) FiO2 (%):  [28 %-35 %] 28 % (09/14 0734)  Hemodynamic parameters for last 24 hours:    Intake/Output from previous day: 09/13 0701 - 09/14 0700 In: 2431.6 [I.V.:498.6; NG/GT:1933] Out: 1530   Intake/Output this shift: No intake/output data recorded.  Vent settings for last 24 hours: FiO2 (%):  [28 %-35 %] 28 %  Physical Exam:  General: alert and no respiratory distress Neuro: F/C HEENT/Neck: trach-clean, intact Resp: clear to auscultation bilaterally CVS: IRR GI: softer, NT Extremities: much less edema  Results for orders placed or performed during the hospital encounter of 12/28/20 (from the past 24 hour(s))  APTT     Status: Abnormal   Collection Time: 01/29/21 10:10 AM  Result Value Ref Range   aPTT 49 (H) 24 - 36 seconds  Glucose, capillary     Status: Abnormal   Collection Time: 01/29/21 11:15 AM  Result Value Ref Range   Glucose-Capillary 162 (H) 70 - 99 mg/dL  Glucose, capillary     Status: Abnormal   Collection Time: 01/29/21  3:21 PM  Result Value Ref Range   Glucose-Capillary 170 (H) 70 - 99 mg/dL  APTT     Status: Abnormal   Collection Time: 01/29/21  3:43 PM  Result Value Ref Range   aPTT 53 (H) 24 - 36 seconds  Renal function panel (daily at 1600)     Status: Abnormal   Collection Time: 01/29/21  4:00 PM  Result Value Ref Range   Sodium 135 135 - 145 mmol/L   Potassium 5.1 3.5 - 5.1 mmol/L   Chloride 97 (L) 98 - 111 mmol/L   CO2 24 22 - 32 mmol/L   Glucose, Bld 185 (H) 70 - 99 mg/dL   BUN 64 (H) 8 - 23 mg/dL   Creatinine, Ser 1.87 (H) 0.61 - 1.24 mg/dL   Calcium 9.0 8.9 - 10.3 mg/dL   Phosphorus 3.9 2.5 - 4.6  mg/dL   Albumin 2.5 (L) 3.5 - 5.0 g/dL   GFR, Estimated 38 (L) >60 mL/min   Anion gap 14 5 - 15  Glucose, capillary     Status: Abnormal   Collection Time: 01/29/21  7:40 PM  Result Value Ref Range   Glucose-Capillary 174 (H) 70 - 99 mg/dL  Glucose, capillary     Status: Abnormal   Collection Time: 01/29/21 11:42 PM  Result Value Ref Range   Glucose-Capillary 247 (H) 70 - 99 mg/dL  Glucose, capillary     Status: Abnormal   Collection Time: 01/30/21  3:43 AM  Result Value Ref Range   Glucose-Capillary 210 (H) 70 - 99 mg/dL  APTT     Status: Abnormal   Collection Time: 01/30/21  5:21 AM  Result Value Ref Range   aPTT 63 (H) 24 - 36 seconds  Renal function panel (daily at 0500)     Status: Abnormal   Collection Time: 01/30/21  5:21 AM  Result Value Ref Range   Sodium 132 (L) 135 - 145 mmol/L   Potassium 5.5 (H) 3.5 - 5.1 mmol/L   Chloride 96 (L) 98 - 111 mmol/L   CO2 22 22 - 32 mmol/L   Glucose, Bld 194 (H) 70 - 99 mg/dL   BUN 99 (H) 8 - 23 mg/dL   Creatinine, Ser 2.95 (H) 0.61 - 1.24 mg/dL   Calcium 9.3 8.9 - 10.3 mg/dL   Phosphorus 6.1 (H) 2.5 - 4.6 mg/dL   Albumin 2.6 (L) 3.5 - 5.0 g/dL   GFR, Estimated 22 (L) >60 mL/min   Anion gap 14 5 - 15  Magnesium     Status: Abnormal   Collection Time: 01/30/21  5:21 AM  Result Value Ref Range   Magnesium 3.0 (H) 1.7 - 2.4 mg/dL  CBC     Status: Abnormal   Collection Time: 01/30/21  5:21 AM  Result Value Ref Range   WBC 14.0 (H) 4.0 - 10.5 K/uL   RBC 3.08 (L) 4.22 - 5.81 MIL/uL   Hemoglobin 8.2 (L) 13.0 - 17.0 g/dL   HCT 26.5 (L) 39.0 - 52.0 %   MCV 86.0 80.0 - 100.0 fL   MCH 26.6 26.0 - 34.0 pg   MCHC 30.9 30.0 - 36.0 g/dL   RDW 17.5 (H) 11.5 - 15.5 %   Platelets 172 150 - 400 K/uL   nRBC 0.0 0.0 - 0.2 %    Assessment & Plan: Present on Admission: **None**    LOS: 33 days   Additional comments:I reviewed the patient's new clinical lab test results. . Fall down stairs 8/12   VDRF - guaifenisen, wean, S/P trach  8/29 by Dr. Bobbye Morton. Has tolerated HTC>24h ID - off abx, no fevers, completed maxipime/ampicillin 8/31, CT A/P with ascending colitis and distention. Stool studies and C. dif are all negative. 9/8 Resp cult Pseud - likely colonized.  TBI/SAH/SDH - NSGY c/s, Dr. Annette Stable. Significant frontal lobe injuries. Keppra x7d for sz ppx (completed) Occipital bone fx - NSGY c/s, Dr. Annette Stable Temporal bone fx extending into middle ear - ENT c/s, Dr. Constance Holster Right TM Rupture - ENT c/s, Dr. Constance Holster AFRVR -  amio drip per cardiology ABL anemia - stable Bilateral pulmonary embolism - bivalirudin, will transition to DOAC once stable off CRRT AKI - CRRT stopped at 6am. Trial of HD per Renal  Hx DM2 - resistant SSI, novolog q4, increase glargine to 54u BID Hx HTN - PRN meds FEN - NPO, tolerating TF VTE - SCDs, bival gtt Dispo - ICU, HTC, therapies Critical Care Total Time*: 34 Minutes  Georganna Skeans, MD, MPH, FACS Trauma & General Surgery Use [email or web address] to contact on call provider  01/30/2021  *Care during the described time interval was provided by me. I have reviewed this patient's available data, including medical history, events of note, physical examination and test results as part of my evaluation.

## 2021-01-30 NOTE — Progress Notes (Signed)
Physical Therapy Treatment Patient Details Name: Angel [name] MRN: [id number] DOB: May 19, 1951 Today's Date: 01/30/2021   History of Present Illness Angel [name] is a 70 y.o. 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 CT showed R temporal and parietal SAH, SAH anterior frontal lobes  bilaterally. Also sustained right occipital skull fracture, temporal bone fx, right TM rupture, bilateral PE. Intubated 8/12, trach'd 8/29. CRRT 9/1- 9/9.  PMH: DM2, HTN    PT Comments    Pt seen with OT today to sit up EOB for the first time in his 32 day stay. Pt tolerated well, VSS. Pt followed simple commands majority of time with bilat UEs and R LE but no movement in L LE. Pt requiring totalAx2 for transfers and to maintain EOB sitting. Pt remains appropriate for LTACH when medically stable. Acute PT to cont to follow to progress OOB mobility.    Recommendations for follow up therapy are one component of a multi-disciplinary discharge planning process, led by the attending physician.  Recommendations may be updated based on patient status, additional functional criteria and insurance authorization.  Follow Up Recommendations  LTACH     Equipment Recommendations  Other (comment) (TBD)    Recommendations for Other Services       Precautions / Restrictions Precautions Precautions: Fall Precaution Comments: trach collar cortrak watch BP / HR, flexiseal Restrictions Weight Bearing Restrictions: No     Mobility  Bed Mobility Overal bed mobility: Needs Assistance Bed Mobility: Rolling;Sit to Supine;Supine to Sit Rolling: +2 for physical assistance;Total assist   Supine to sit: +2 for physical assistance;Total assist Sit to supine: +2 for physical assistance;Total assist   General bed mobility comments: with v/c's pt able to move R LE towards EOB otherwise pt total assist at LEs and trunk to transfer to EOB, totalA to maintain EOB balance, pt with occasional pushign with L hand posteriorly     Transfers                 General transfer comment: sitting eob for the first time, pt planned for HD later today  Ambulation/Gait             General Gait Details: unable at this time   Stairs             Wheelchair Mobility    Modified Rankin (Stroke Patients Only) Modified Rankin (Stroke Patients Only) Pre-Morbid Rankin Score: No symptoms Modified Rankin: Severe disability     Balance Overall balance assessment: Needs assistance Sitting-balance support: Bilateral upper extremity supported;Feet supported Sitting balance-Leahy Scale: Zero Sitting balance - Comments: requires support of bed in egress position                                    Cognition Arousal/Alertness: Awake/alert Behavior During Therapy: Flat affect Overall Cognitive Status: Difficult to assess Area of Impairment: Rancho level               Rancho Levels of Cognitive Functioning Rancho Los Amigos Scales of Cognitive Functioning: Confused/inappropriate/non-agitated               General Comments: pt following simple commands majority of the time with UEs and R LE but not L LE. Pt non-verbal, did attempt to vocalize but unintelligable. verbal cues to maintain eyes open t/o session, opened eyes to name consistently      Exercises General Exercises - Upper Extremity Shoulder  Flexion: AROM;Right;Left;10 reps;Seated Elbow Flexion: AROM;Both;10 reps;Seated General Exercises - Lower Extremity Long Arc Quad: PROM;Both;5 reps;Seated Other Exercises Other Exercises: AAROM digits bil UE, wrist and elbow    General Comments General comments (skin integrity, edema, etc.): BP inc from 148/82 to 166/89 once sitting EOB, then dec to 148/60 upon return to supine      Pertinent Vitals/Pain Pain Assessment: No/denies pain    Home Living                      Prior Function            PT Goals (current goals can now be found in the care plan  section) Acute Rehab PT Goals Patient Stated Goal: none stated by patient. no family present at this tiem PT Goal Formulation: With patient/family Time For Goal Achievement: 02/07/21 Potential to Achieve Goals: Fair Progress towards PT goals: Progressing toward goals    Frequency    Min 3X/week      PT Plan Current plan remains appropriate    Co-evaluation PT/OT/SLP Co-Evaluation/Treatment: Yes Reason for Co-Treatment: Complexity of the patient's impairments (multi-system involvement);For patient/therapist safety PT goals addressed during session: Mobility/safety with mobility OT goals addressed during session: ADL's and self-care;Proper use of Adaptive equipment and DME;Strengthening/ROM      AM-PAC PT "6 Clicks" Mobility   Outcome Measure  Help needed turning from your back to your side while in a flat bed without using bedrails?: Total Help needed moving from lying on your back to sitting on the side of a flat bed without using bedrails?: Total Help needed moving to and from a bed to a chair (including a wheelchair)?: Total Help needed standing up from a chair using your arms (e.g., wheelchair or bedside chair)?: Total Help needed to walk in hospital room?: Total Help needed climbing 3-5 steps with a railing? : Total 6 Click Score: 6    End of Session Equipment Utilized During Treatment: Oxygen (trach collar) Activity Tolerance: Patient limited by fatigue Patient left: in bed;with call bell/phone within reach Nurse Communication: Mobility status PT Visit Diagnosis: Unsteadiness on feet (R26.81);Muscle weakness (generalized) (M62.81);Difficulty in walking, not elsewhere classified (R26.2)     Time: [id number] PT Time Calculation (min) (ACUTE ONLY): 33 min  Charges:  $Therapeutic Activity: 8-22 mins                     Kittie Plater, PT, DPT Acute Rehabilitation Services Pager #: [phone] Office #: [phone]    Berline Lopes 01/30/2021, 12:57 PM

## 2021-01-30 NOTE — Progress Notes (Signed)
[facility] NEPHROLOGY PROGRESS NOTE  Assessment/ Plan:  #Acute kidney injury, oliguric: Multifactorial etiology including ischemic ATN in the setting of hypotension, sepsis complicated by contrast injury. CRRT from 9/1-9/9.  No heparin as he is on bivalirudin.  The HD catheter was changed on 9/7.  CRRT restarted on 9/11 given elevated BUN and with more confusion, did not tolerated IHD on 9/10 (confusion, hypotension). CRRT clotted on 9/13 -no signs of renal recovery -attempting IHD today, low temps and flow rates, will aim for UF 1-2L as tolerated -Continue to monitor strict ins and out, daily labs.  #Fall/bilateral subarachnoid hemorrhage/SDH, TBI/occipital and temporal bone fracture: Per trauma team.  Repeat CT scan with no acute finding.  #Acute respiratory failure: Status post trach on 8/29 and on vent.  #A. fib with RVR: On amiodarone, Angiomax.  # Anemia of critical illness: Transfuse as needed.  #Metabolic acidosis: Managed with dialysis.   #Bilateral pulm embolism: Currently on anticoagulation.  #Acute febrile illness: Per primary team.  #Hyperkalemia: on CRRT  Discussed with RN.  Subjective: Seen and examined in ICU. CRRT clotted around 6pm yesterday, no restart. HD attempt later today. VSS, in NSR on amio. Objective Vital signs in last 24 hours: Vitals:   01/30/21 0500 01/30/21 0700 01/30/21 0734 01/30/21 0800  BP: (!) 149/61 120/63 120/63 134/70  Pulse: 81 79 78 77  Resp: 20 19 17  (!) 22  Temp:    98.2 F (36.8 C)  TempSrc:    Oral  SpO2: 99% 99% 100% 100%  Weight:      Height:       Weight change:   Intake/Output Summary (Last 24 hours) at 01/30/2021 0948 Last data filed at 01/30/2021 0800 Gross per 24 hour  Intake 2425.41 ml  Output 1469 ml  Net 956.41 ml       Labs: Basic Metabolic Panel: Recent Labs  Lab 01/29/21 0317 01/29/21 1600 01/30/21 0521  NA 132* 135 132*  K 4.9 5.1 5.5*  CL 96* 97* 96*  CO2 25 24 22   GLUCOSE 201*  185* 194*  BUN 64* 64* 99*  CREATININE 1.94* 1.87* 2.95*  CALCIUM 9.0 9.0 9.3  PHOS 3.6 3.9 6.1*   Liver Function Tests: Recent Labs  Lab 01/29/21 0317 01/29/21 1600 01/30/21 0521  ALBUMIN 2.5* 2.5* 2.6*   No results for input(s): LIPASE, AMYLASE in the last 168 hours. No results for input(s): AMMONIA in the last 168 hours. CBC: Recent Labs  Lab 01/26/21 0452 01/27/21 0227 01/28/21 0319 01/29/21 0317 01/30/21 0521  WBC 14.4* 27.3* 11.7* 11.4* 14.0*  HGB 7.9* 8.3*  9.9* 7.8* 8.2* 8.2*  HCT 24.8* 27.0*  29.0* 24.9* 26.4* 26.5*  MCV 84.4 84.4 85.9 84.6 86.0  PLT 186 236 164 157 172   Cardiac Enzymes: No results for input(s): CKTOTAL, CKMB, CKMBINDEX, TROPONINI in the last 168 hours. CBG: Recent Labs  Lab 01/29/21 1521 01/29/21 1940 01/29/21 2342 01/30/21 0343 01/30/21 0845  GLUCAP 170* 174* 247* 210* 189*    Iron Studies: No results for input(s): IRON, TIBC, TRANSFERRIN, FERRITIN in the last 72 hours. Studies/Results: No results found.  Medications: Infusions:   prismasol BGK 4/2.5 500 mL/hr at 01/29/21 1750   sodium chloride     sodium chloride     sodium chloride     amiodarone 30 mg/hr (01/30/21 0800)   bivalirudin (ANGIOMAX) infusion 0.5 mg/mL (Non-ACS indications) 0.022 mg/kg/hr (01/30/21 0800)   feeding supplement (PIVOT 1.5 CAL) 65 mL/hr at 01/29/21 [place]  2/2.5 replacement solution 300 mL/hr at 01/29/21 1750   prismasol BGK 4/2.5 1,500 mL/hr at 01/29/21 1600    Scheduled Medications:  acetaminophen  1,000 mg Per Tube Q6H   chlorhexidine gluconate (MEDLINE KIT)  15 mL Mouth Rinse BID   Chlorhexidine Gluconate Cloth  6 each Topical Q0600   clonazePAM  0.5 mg Per Tube BID   docusate  100 mg Per Tube BID   feeding supplement (PROSource TF)  90 mL Per Tube BID   guaiFENesin  10 mL Per Tube Q4H   hydrALAZINE  25 mg Per Tube Q8H   insulin aspart  0-20 Units Subcutaneous Q4H   insulin aspart  10 Units Subcutaneous Q4H   insulin  glargine-yfgn  54 Units Subcutaneous BID   mouth rinse  15 mL Mouth Rinse 10 times per day   methocarbamol  1,000 mg Per Tube Q8H   pantoprazole sodium  40 mg Per Tube Daily   polyethylene glycol  17 g Per Tube Daily   QUEtiapine  100 mg Per Tube BID   senna  1 tablet Per Tube Daily   sodium chloride flush  10-40 mL Intracatheter Q12H    have reviewed scheduled and prn medications.  Physical Exam: General: nad, tracheostomy on vent. Heart: rrr Lungs: trach, cta bl Abdomen:soft, nontender. Extremities: Only trace dependent edema. Neurology:Alert awake  Dialysis Access: Left subclavian temporary HD catheter placed on 9/7.  Angel [name] 01/30/2021,9:48 AM  LOS: 33 days

## 2021-01-30 NOTE — Progress Notes (Addendum)
Central for bivalirudin Indication: atrial fibrillation, DVT, PE 8/23   No Known Allergies  Patient Measurements: Height: 6\' 2"  (188 cm) Weight: 108.7 kg (239 lb 10.2 oz) IBW/kg (Calculated) : 82.2 Heparin Dosing Weight: 107kg  Vital Signs: Temp: 98.2 F (36.8 C) (09/14 0400) Temp Source: Axillary (09/14 0400) BP: 149/61 (09/14 0500) Pulse Rate: 81 (09/14 0500)  Labs: Recent Labs    01/28/21 0319 01/28/21 1525 01/29/21 0317 01/29/21 1010 01/29/21 1543 01/29/21 1600 01/30/21 0521  HGB 7.8*  --  8.2*  --   --   --  8.2*  HCT 24.9*  --  26.4*  --   --   --  26.5*  PLT 164  --  157  --   --   --  172  APTT 51*  --  48* 49* 53*  --  63*  CREATININE 2.80*   < > 1.94*  --   --  1.87* 2.95*   < > = values in this interval not displayed.     Estimated Creatinine Clearance: 30.6 mL/min (A) (by C-G formula based on SCr of 2.95 mg/dL (H)).   Assessment: 67 YOM presenting s/p fall with TBI/SAH and facial fx, in afib started on amiodarone and now cleared per trauma for full dose anticoagulation. 8/23 patient found to have small acute bilateral PE and age-indeterminate LUE DVT. Pharmacy consulted to dose bivalirudin per Trauma.  Given recent head bleed, will aim for middle of therapeutic range aptt and watch closely for signs and symptoms of bleeding.   aPTT this morning is slightly SUBtherapeutic (aPTT 63, goal of 50-65). Hgb low but stable - no bleeding or infusion issues noted at this time.   Goal of Therapy:  Aptt goal ~50-65s per discussion with Trauma  Monitor platelets by anticoagulation protocol: Yes   Plan:  Continue bivalirudin to 0.022 mg/kg/hr Monitor daily aPTT, CBC, s/sx bleeding F/u long-term plan for anticoagulation as appropriate  Thank you for allowing pharmacy to be a part of this patient's care.  Alycia Rossetti, PharmD, BCPS Clinical Pharmacist Clinical phone for 01/30/2021: [phone] 01/30/2021 7:22 AM    **Pharmacist phone directory can now be found on [email or web address] ([email or web address]).  Listed under [place].

## 2021-01-31 DIAGNOSIS — I4891 Unspecified atrial fibrillation: Secondary | ICD-10-CM | POA: Diagnosis not present

## 2021-01-31 DIAGNOSIS — J9601 Acute respiratory failure with hypoxia: Secondary | ICD-10-CM | POA: Diagnosis not present

## 2021-01-31 DIAGNOSIS — I10 Essential (primary) hypertension: Secondary | ICD-10-CM | POA: Diagnosis not present

## 2021-01-31 DIAGNOSIS — J9690 Respiratory failure, unspecified, unspecified whether with hypoxia or hypercapnia: Secondary | ICD-10-CM

## 2021-01-31 LAB — RENAL FUNCTION PANEL
Albumin: 2.8 g/dL — ABNORMAL LOW (ref 3.5–5.0)
Albumin: 2.6 g/dL — ABNORMAL LOW (ref 3.5–5.0)
Anion gap: 15 (ref 5–15)
Anion gap: 15 (ref 5–15)
BUN: 83 mg/dL — ABNORMAL HIGH (ref 8–23)
BUN: 108 mg/dL — ABNORMAL HIGH (ref 8–23)
CO2: 25 mmol/L (ref 22–32)
CO2: 22 mmol/L (ref 22–32)
Calcium: 9.5 mg/dL (ref 8.9–10.3)
Calcium: 9.1 mg/dL (ref 8.9–10.3)
Chloride: 93 mmol/L — ABNORMAL LOW (ref 98–111)
Chloride: 97 mmol/L — ABNORMAL LOW (ref 98–111)
Creatinine, Ser: 2.48 mg/dL — ABNORMAL HIGH (ref 0.61–1.24)
Creatinine, Ser: 3.07 mg/dL — ABNORMAL HIGH (ref 0.61–1.24)
GFR, Estimated: 27 mL/min — ABNORMAL LOW (ref >60)
GFR, Estimated: 21 mL/min — ABNORMAL LOW (ref >60)
Glucose, Bld: 237 mg/dL — ABNORMAL HIGH (ref 70–99)
Glucose, Bld: 153 mg/dL — ABNORMAL HIGH (ref 70–99)
Phosphorus: 5 mg/dL — ABNORMAL HIGH (ref 2.5–4.6)
Phosphorus: 6.5 mg/dL — ABNORMAL HIGH (ref 2.5–4.6)
Potassium: 4.4 mmol/L (ref 3.5–5.1)
Potassium: 4.5 mmol/L (ref 3.5–5.1)
Sodium: 133 mmol/L — ABNORMAL LOW (ref 135–145)
Sodium: 134 mmol/L — ABNORMAL LOW (ref 135–145)

## 2021-01-31 LAB — CBC
HCT: 23.5 % — ABNORMAL LOW (ref 39.0–52.0)
Hemoglobin: 7.2 g/dL — ABNORMAL LOW (ref 13.0–17.0)
MCH: 26.2 pg (ref 26.0–34.0)
MCHC: 30.6 g/dL (ref 30.0–36.0)
MCV: 85.5 fL (ref 80.0–100.0)
Platelets: 152 10*3/uL (ref 150–400)
RBC: 2.75 MIL/uL — ABNORMAL LOW (ref 4.22–5.81)
RDW: 17.9 % — ABNORMAL HIGH (ref 11.5–15.5)
WBC: 10.5 10*3/uL (ref 4.0–10.5)
nRBC: 0 % (ref 0.0–0.2)

## 2021-01-31 LAB — HEPATITIS B SURFACE ANTIBODY, QUANTITATIVE: Hep B S AB Quant (Post): <3.1 m[IU]/mL — ABNORMAL LOW (ref >9.9)

## 2021-01-31 LAB — GLUCOSE, CAPILLARY
Glucose-Capillary: 243 mg/dL — ABNORMAL HIGH (ref 70–99)
Glucose-Capillary: 168 mg/dL — ABNORMAL HIGH (ref 70–99)
Glucose-Capillary: 156 mg/dL — ABNORMAL HIGH (ref 70–99)
Glucose-Capillary: 131 mg/dL — ABNORMAL HIGH (ref 70–99)
Glucose-Capillary: 147 mg/dL — ABNORMAL HIGH (ref 70–99)
Glucose-Capillary: 123 mg/dL — ABNORMAL HIGH (ref 70–99)

## 2021-01-31 LAB — APTT: aPTT: 64 s — ABNORMAL HIGH (ref 24–36)

## 2021-01-31 LAB — MAGNESIUM: Magnesium: 2.7 mg/dL — ABNORMAL HIGH (ref 1.7–2.4)

## 2021-01-31 MED ORDER — PANTOPRAZOLE 2 MG/ML SUSPENSION
40.0000 mg | Freq: Every day | ORAL | Status: DC
  Administered 2021-02-01 – 2021-02-12 (×12): 40 mg
  Filled 2021-01-31 (×9): qty 20

## 2021-01-31 MED ORDER — AMIODARONE LOAD VIA INFUSION
150.0000 mg | Freq: Once | INTRAVENOUS | Status: AC
  Administered 2021-01-31: 150 mg via INTRAVENOUS
  Filled 2021-01-31: qty 83.34

## 2021-01-31 MED ORDER — QUETIAPINE FUMARATE 50 MG PO TABS
50.0000 mg | ORAL_TABLET | Freq: Every day | ORAL | Status: DC
  Administered 2021-01-31 – 2021-02-05 (×6): 50 mg
  Filled 2021-01-31 (×2): qty 2
  Filled 2021-01-31: qty 1
  Filled 2021-01-31: qty 2
  Filled 2021-01-31: qty 1
  Filled 2021-01-31: qty 2

## 2021-01-31 MED ORDER — INSULIN GLARGINE-YFGN 100 UNIT/ML ~~LOC~~ SOLN
60.0000 [IU] | Freq: Two times a day (BID) | SUBCUTANEOUS | Status: DC
  Administered 2021-01-31 – 2021-02-06 (×12): 60 [IU] via SUBCUTANEOUS
  Filled 2021-01-31 (×15): qty 0.6

## 2021-01-31 NOTE — Progress Notes (Signed)
Patient ID: Angel [name], male   DOB: [birth year], 70 y.o.   MRN: [id number] Follow up - Trauma Critical Care  Patient Details:    Angel [name] is an 70 y.o. male.  Lines/tubes : PICC Triple Lumen 02/77/41 PICC Right Basilic 47 cm 1 cm (Active)  Indication for Insertion or Continuance of Line Prolonged intravenous therapies;Limited venous access - need for IV therapy >5 days (PICC only) 01/30/21 1950  Exposed Catheter (cm) 1 cm 01/09/21 0916  Site Assessment Clean;Dry;Intact 01/30/21 1950  Lumen #1 Status Infusing 01/30/21 1950  Lumen #2 Status Infusing 01/30/21 1950  Lumen #3 Status In-line blood sampling system in place;Flushed;Blood return noted 01/30/21 1950  Dressing Type Transparent 01/30/21 1950  Dressing Status Clean;Dry;Intact 01/30/21 1950  Antimicrobial disc in place? Yes 01/30/21 1950  Safety Lock Not Applicable 28/78/67 6720  Line Care Connections checked and tightened 01/30/21 1950  Dressing Intervention Other (Comment) 01/26/21 2100  Dressing Change Due 01/31/21 01/30/21 1950     Fecal Management System (Active)  Does patient meet criteria for removal? No 01/30/21 2000  Daily care Skin around tube assessed 01/30/21 2000  Output (mL) 100 mL 01/30/21 1800  Intake (mL) 85 mL 01/28/21 1641    Microbiology/Sepsis markers: Results for orders placed or performed during the hospital encounter of 12/28/20  Resp Panel by RT-PCR (Flu A&B, Covid) Nasopharyngeal Swab     Status: None   Collection Time: 12/28/20  4:17 PM   Specimen: Nasopharyngeal Swab; Nasopharyngeal(NP) swabs in vial transport medium  Result Value Ref Range Status   SARS Coronavirus 2 by RT PCR NEGATIVE NEGATIVE Final    Comment: (NOTE) SARS-CoV-2 target nucleic acids are NOT DETECTED.  The SARS-CoV-2 RNA is generally detectable in upper respiratory specimens during the acute phase of infection. The lowest concentration of SARS-CoV-2 viral copies this assay can detect is 138 copies/mL. A negative result does  not preclude SARS-Cov-2 infection and should not be used as the sole basis for treatment or other patient management decisions. A negative result may occur with  improper specimen collection/handling, submission of specimen other than nasopharyngeal swab, presence of viral mutation(s) within the areas targeted by this assay, and inadequate number of viral copies(<138 copies/mL). A negative result must be combined with clinical observations, patient history, and epidemiological information. The expected result is Negative.  Fact Sheet for Patients:  EntrepreneurPulse.com.au  Fact Sheet for Healthcare Providers:  IncredibleEmployment.be  This test is no t yet approved or cleared by the [place] FDA and  has been authorized for detection and/or diagnosis of SARS-CoV-2 by FDA under an Emergency Use Authorization (EUA). This EUA will remain  in effect (meaning this test can be used) for the duration of the COVID-19 declaration under Section 564(b)(1) of the Act, 21 U.S.C.section 360bbb-3(b)(1), unless the authorization is terminated  or revoked sooner.       Influenza A by PCR NEGATIVE NEGATIVE Final   Influenza B by PCR NEGATIVE NEGATIVE Final    Comment: (NOTE) The Xpert Xpress SARS-CoV-2/FLU/RSV plus assay is intended as an aid in the diagnosis of influenza from Nasopharyngeal swab specimens and should not be used as a sole basis for treatment. Nasal washings and aspirates are unacceptable for Xpert Xpress SARS-CoV-2/FLU/RSV testing.  Fact Sheet for Patients: EntrepreneurPulse.com.au  Fact Sheet for Healthcare Providers: IncredibleEmployment.be  This test is not yet approved or cleared by the [place] FDA and has been authorized for detection and/or diagnosis of SARS-CoV-2 by FDA under an Emergency Use Authorization (EUA).  This EUA will remain in effect (meaning this test can be used) for the  duration of the COVID-19 declaration under Section 564(b)(1) of the Act, 21 U.S.C. section 360bbb-3(b)(1), unless the authorization is terminated or revoked.  Performed at [facility], [place] [address] [place], [place] [address]   MRSA Next Gen by PCR, Nasal     Status: None   Collection Time: 12/28/20  7:32 PM   Specimen: Nasal Mucosa; Nasal Swab  Result Value Ref Range Status   MRSA by PCR Next Gen NOT DETECTED NOT DETECTED Final    Comment: (NOTE) The GeneXpert MRSA Assay (FDA approved for NASAL specimens only), is one component of a comprehensive MRSA colonization surveillance program. It is not intended to diagnose MRSA infection nor to guide or monitor treatment for MRSA infections. Test performance is not FDA approved in patients less than 37 years old. Performed at [facility], [place] [address] [place], [place] [address]   Culture, Respiratory w Gram Stain     Status: None   Collection Time: 12/31/20 11:06 AM   Specimen: Tracheal Aspirate; Respiratory  Result Value Ref Range Status   Specimen Description TRACHEAL ASPIRATE  Final   Special Requests NONE  Final   Gram Stain   Final    FEW SQUAMOUS EPITHELIAL CELLS PRESENT FEW WBC PRESENT,BOTH PMN AND MONONUCLEAR FEW GRAM POSITIVE COCCI Performed at [facility], [place] [address], [place], [place] [address]    Culture   Final    FEW PSEUDOMONAS AERUGINOSA FEW STREPTOCOCCUS PNEUMONIAE    Report Status 01/03/2021 FINAL  Final   Organism ID, Bacteria PSEUDOMONAS AERUGINOSA  Final   Organism ID, Bacteria STREPTOCOCCUS PNEUMONIAE  Final      Susceptibility   Pseudomonas aeruginosa - MIC*    CEFTAZIDIME 4 SENSITIVE Sensitive     CIPROFLOXACIN <=0.25 SENSITIVE Sensitive     GENTAMICIN <=1 SENSITIVE Sensitive     IMIPENEM 2 SENSITIVE Sensitive     PIP/TAZO 8 SENSITIVE Sensitive     CEFEPIME 2 SENSITIVE Sensitive     * FEW PSEUDOMONAS AERUGINOSA   Streptococcus pneumoniae - MIC*    ERYTHROMYCIN 4  RESISTANT Resistant     LEVOFLOXACIN 0.5 SENSITIVE Sensitive     VANCOMYCIN <=0.12 SENSITIVE Sensitive     PENO - penicillin <=0.06      PENICILLIN (non-meningitis) <=0.06 SENSITIVE Sensitive     PENICILLIN (oral) <=0.06 SENSITIVE Sensitive     CEFTRIAXONE (non-meningitis) <=0.12 SENSITIVE Sensitive     * FEW STREPTOCOCCUS PNEUMONIAE  Culture, blood (routine x 2)     Status: None   Collection Time: 01/05/21 11:44 AM   Specimen: BLOOD  Result Value Ref Range Status   Specimen Description BLOOD SITE NOT SPECIFIED  Final   Special Requests AEROBIC BOTTLE ONLY Blood Culture adequate volume  Final   Culture   Final    NO GROWTH 5 DAYS Performed at [facility], [address] [place], [place] [address]    Report Status 01/10/2021 FINAL  Final  Culture, blood (routine x 2)     Status: None   Collection Time: 01/05/21 11:44 AM   Specimen: BLOOD  Result Value Ref Range Status   Specimen Description BLOOD SITE NOT SPECIFIED  Final   Special Requests   Final    AEROBIC BOTTLE ONLY Blood Culture results may not be optimal due to an inadequate volume of blood received in culture bottles   Culture   Final  NO GROWTH 5 DAYS Performed at [facility], [place] [address] [place], [place] [address]    Report Status 01/10/2021 FINAL  Final  Surgical PCR screen     Status: None   Collection Time: 01/08/21 12:14 AM   Specimen: Nasal Mucosa; Nasal Swab  Result Value Ref Range Status   MRSA, PCR NEGATIVE NEGATIVE Final   Staphylococcus aureus NEGATIVE NEGATIVE Final    Comment: (NOTE) The Xpert SA Assay (FDA approved for NASAL specimens in patients 58 years of age and older), is one component of a comprehensive surveillance program. It is not intended to diagnose infection nor to guide or monitor treatment. Performed at [facility], [place] [address], [place], [place] [address]   Culture, Respiratory w Gram Stain     Status: None   Collection Time: 01/08/21  1:24 PM    Specimen: Tracheal Aspirate; Respiratory  Result Value Ref Range Status   Specimen Description TRACHEAL ASPIRATE  Final   Special Requests NONE  Final   Gram Stain   Final    RARE SQUAMOUS EPITHELIAL CELLS PRESENT MODERATE WBC PRESENT, PREDOMINANTLY MONONUCLEAR FEW GRAM NEGATIVE RODS Performed at [facility], [place] [address], [place], [place] [address]    Culture   Final    RARE PSEUDOMONAS AERUGINOSA RARE ENTEROCOCCUS FAECALIS    Report Status 01/11/2021 FINAL  Final   Organism ID, Bacteria PSEUDOMONAS AERUGINOSA  Final   Organism ID, Bacteria ENTEROCOCCUS FAECALIS  Final      Susceptibility   Enterococcus faecalis - MIC*    AMPICILLIN <=2 SENSITIVE Sensitive     VANCOMYCIN 1 SENSITIVE Sensitive     GENTAMICIN SYNERGY SENSITIVE Sensitive     * RARE ENTEROCOCCUS FAECALIS   Pseudomonas aeruginosa - MIC*    CEFTAZIDIME 4 SENSITIVE Sensitive     CIPROFLOXACIN <=0.25 SENSITIVE Sensitive     GENTAMICIN <=1 SENSITIVE Sensitive     IMIPENEM 2 SENSITIVE Sensitive     PIP/TAZO 8 SENSITIVE Sensitive     CEFEPIME 2 SENSITIVE Sensitive     * RARE PSEUDOMONAS AERUGINOSA  Gastrointestinal Panel by PCR , Stool     Status: None   Collection Time: 01/08/21  5:04 PM   Specimen: Stool  Result Value Ref Range Status   Campylobacter species NOT DETECTED NOT DETECTED Final   Plesimonas shigelloides NOT DETECTED NOT DETECTED Final   Salmonella species NOT DETECTED NOT DETECTED Final   Yersinia enterocolitica NOT DETECTED NOT DETECTED Final   Vibrio species NOT DETECTED NOT DETECTED Final   Vibrio cholerae NOT DETECTED NOT DETECTED Final   Enteroaggregative E coli (EAEC) NOT DETECTED NOT DETECTED Final   Enteropathogenic E coli (EPEC) NOT DETECTED NOT DETECTED Final   Enterotoxigenic E coli (ETEC) NOT DETECTED NOT DETECTED Final   Shiga like toxin producing E coli (STEC) NOT DETECTED NOT DETECTED Final   Shigella/Enteroinvasive E coli (EIEC) NOT DETECTED NOT DETECTED Final    Cryptosporidium NOT DETECTED NOT DETECTED Final   Cyclospora cayetanensis NOT DETECTED NOT DETECTED Final   Entamoeba histolytica NOT DETECTED NOT DETECTED Final   Giardia lamblia NOT DETECTED NOT DETECTED Final   Adenovirus F40/41 NOT DETECTED NOT DETECTED Final   Astrovirus NOT DETECTED NOT DETECTED Final   Norovirus GI/GII NOT DETECTED NOT DETECTED Final   Rotavirus A NOT DETECTED NOT DETECTED Final   Sapovirus (I, II, IV, and V) NOT DETECTED NOT DETECTED Final    Comment: Performed at [facility], [place] [place],  [place] [address]  C Difficile Quick Screen (NO PCR Reflex)     Status: None   Collection Time: 01/09/21 11:07 AM   Specimen: STOOL  Result Value Ref Range Status   C Diff antigen NEGATIVE NEGATIVE Final   C Diff toxin NEGATIVE NEGATIVE Final   C Diff interpretation No C. difficile detected.  Final    Comment: Performed at [facility], [place] [address] [place], [place] [address]  Culture, Respiratory w Gram Stain     Status: None   Collection Time: 01/24/21  8:57 AM   Specimen: Tracheal Aspirate; Respiratory  Result Value Ref Range Status   Specimen Description TRACHEAL ASPIRATE  Final   Special Requests NONE  Final   Gram Stain   Final    ABUNDANT WBC PRESENT, PREDOMINANTLY MONONUCLEAR RARE FEW GRAM NEGATIVE RODS    Culture   Final    ABUNDANT PSEUDOMONAS AERUGINOSA Two isolates with different morphologies were identified as the same organism.The most resistant organism was reported. Performed at [facility], [place] [address], [place], [place] [address]    Report Status 01/27/2021 FINAL  Final   Organism ID, Bacteria PSEUDOMONAS AERUGINOSA  Final      Susceptibility   Pseudomonas aeruginosa - MIC*    CEFTAZIDIME 16 INTERMEDIATE Intermediate     CIPROFLOXACIN 1 SENSITIVE Sensitive     GENTAMICIN <=1 SENSITIVE Sensitive     IMIPENEM 2 SENSITIVE Sensitive     * ABUNDANT PSEUDOMONAS AERUGINOSA    Anti-infectives:  Anti-infectives  (From admission, onward)    Start     Dose/Rate Route Frequency Ordered Stop   01/11/21 2330  ceFEPIme (MAXIPIME) 2 g in sodium chloride 0.9 % 100 mL IVPB  Status:  Discontinued        2 g 200 mL/hr over 30 Minutes Intravenous Every 24 hours 01/11/21 0711 01/16/21 0907   01/10/21 1645  ampicillin (OMNIPEN) 2 g in sodium chloride 0.9 % 100 mL IVPB  Status:  Discontinued        2 g 300 mL/hr over 20 Minutes Intravenous Every 8 hours 01/10/21 1549 01/16/21 0907   01/09/21 2200  ceFEPIme (MAXIPIME) 2 g in sodium chloride 0.9 % 100 mL IVPB  Status:  Discontinued        2 g 200 mL/hr over 30 Minutes Intravenous Every 12 hours 01/09/21 1458 01/11/21 0711   01/08/21 1515  metroNIDAZOLE (FLAGYL) IVPB 500 mg  Status:  Discontinued        500 mg 100 mL/hr over 60 Minutes Intravenous Every 8 hours 01/08/21 1428 01/10/21 1618   01/03/21 0600  vancomycin (VANCOREADY) IVPB 1250 mg/250 mL  Status:  Discontinued        1,250 mg 166.7 mL/hr over 90 Minutes Intravenous Every 12 hours 01/02/21 1717 01/03/21 0837   01/02/21 1800  vancomycin (VANCOREADY) IVPB 2000 mg/400 mL        2,000 mg 200 mL/hr over 120 Minutes Intravenous  Once 01/02/21 1712 01/02/21 2007   01/02/21 0900  ceFEPIme (MAXIPIME) 2 g in sodium chloride 0.9 % 100 mL IVPB  Status:  Discontinued        2 g 200 mL/hr over 30 Minutes Intravenous Every 8 hours 01/02/21 0849 01/09/21 1458       Best Practice/Protocols:  VTE Prophylaxis: Direct Thrombin Inhibitor Intermittent Sedation  Consults: Treatment Team:  Georganna Skeans, MD Roney Jaffe, MD    Studies:    Events:  Subjective:    Overnight Issues:   Objective:  Vital signs for last 24 hours: Temp:  [97.6 F (36.4 C)-98.4 F (36.9 C)] 98.3 F (36.8 C) (09/15 0800) Pulse Rate:  [71-119] 114 (09/15 0804) Resp:  [14-26] 20 (09/15 0804) BP: (102-153)/(54-93) 116/70 (09/15 0800) SpO2:  [97 %-100 %] 98 % (09/15 0804) FiO2 (%):  [28 %] 28 % (09/15 0804) Weight:   [108.1 kg-109 kg] 108.1 kg (09/14 2300)  Hemodynamic parameters for last 24 hours:    Intake/Output from previous day: 09/14 0701 - 09/15 0700 In: 1480.9 [I.V.:583.2; NG/GT:897.8] Out: 1250 [Urine:250; Stool:100]  Intake/Output this shift: Total I/O In: 66.6 [I.V.:66.6] Out: -   Vent settings for last 24 hours: FiO2 (%):  [28 %] 28 %  Physical Exam:  General: no respiratory distress Neuro: F/C HEENT/Neck: trach-clean, intact Resp: clear to auscultation bilaterally CVS: IRR GI: soft, NT Extremities: much less edema  Results for orders placed or performed during the hospital encounter of 12/28/20 (from the past 24 hour(s))  Hepatitis B surface antigen     Status: None   Collection Time: 01/30/21  9:34 AM  Result Value Ref Range   Hepatitis B Surface Ag NON REACTIVE NON REACTIVE  Hepatitis B surface antibody     Status: None   Collection Time: 01/30/21  9:34 AM  Result Value Ref Range   Hep B S Ab NON REACTIVE NON REACTIVE  Hepatitis B surface antibody,quantitative     Status: Abnormal   Collection Time: 01/30/21  9:34 AM  Result Value Ref Range   Hepatitis B-Post <3.1 (L) Immunity>9.9 mIU/mL  Glucose, capillary     Status: Abnormal   Collection Time: 01/30/21 12:18 PM  Result Value Ref Range   Glucose-Capillary 212 (H) 70 - 99 mg/dL  Renal function panel (daily at 1600)     Status: Abnormal   Collection Time: 01/30/21  4:00 PM  Result Value Ref Range   Sodium 133 (L) 135 - 145 mmol/L   Potassium 5.7 (H) 3.5 - 5.1 mmol/L   Chloride 95 (L) 98 - 111 mmol/L   CO2 20 (L) 22 - 32 mmol/L   Glucose, Bld 228 (H) 70 - 99 mg/dL   BUN 134 (H) 8 - 23 mg/dL   Creatinine, Ser 3.51 (H) 0.61 - 1.24 mg/dL   Calcium 9.0 8.9 - 10.3 mg/dL   Phosphorus 7.1 (H) 2.5 - 4.6 mg/dL   Albumin 2.4 (L) 3.5 - 5.0 g/dL   GFR, Estimated 18 (L) >60 mL/min   Anion gap 18 (H) 5 - 15  Glucose, capillary     Status: Abnormal   Collection Time: 01/30/21  4:33 PM  Result Value Ref Range    Glucose-Capillary 165 (H) 70 - 99 mg/dL  Glucose, capillary     Status: Abnormal   Collection Time: 01/30/21  7:50 PM  Result Value Ref Range   Glucose-Capillary 194 (H) 70 - 99 mg/dL  Glucose, capillary     Status: Abnormal   Collection Time: 01/30/21 11:38 PM  Result Value Ref Range   Glucose-Capillary 221 (H) 70 - 99 mg/dL  Glucose, capillary     Status: Abnormal   Collection Time: 01/31/21  3:45 AM  Result Value Ref Range   Glucose-Capillary 243 (H) 70 - 99 mg/dL  APTT     Status: Abnormal   Collection Time: 01/31/21  5:20 AM  Result Value Ref Range   aPTT 64 (H) 24 - 36 seconds  Magnesium     Status: Abnormal   Collection Time: 01/31/21  5:20 AM  Result Value Ref Range   Magnesium 2.7 (H) 1.7 - 2.4 mg/dL  CBC     Status: Abnormal   Collection Time: 01/31/21  5:20 AM  Result Value Ref Range   WBC 10.5 4.0 - 10.5 K/uL   RBC 2.75 (L) 4.22 - 5.81 MIL/uL   Hemoglobin 7.2 (L) 13.0 - 17.0 g/dL   HCT 23.5 (L) 39.0 - 52.0 %   MCV 85.5 80.0 - 100.0 fL   MCH 26.2 26.0 - 34.0 pg   MCHC 30.6 30.0 - 36.0 g/dL   RDW 17.9 (H) 11.5 - 15.5 %   Platelets 152 150 - 400 K/uL   nRBC 0.0 0.0 - 0.2 %  Renal function panel     Status: Abnormal   Collection Time: 01/31/21  5:20 AM  Result Value Ref Range   Sodium 133 (L) 135 - 145 mmol/L   Potassium 4.4 3.5 - 5.1 mmol/L   Chloride 93 (L) 98 - 111 mmol/L   CO2 25 22 - 32 mmol/L   Glucose, Bld 237 (H) 70 - 99 mg/dL   BUN 83 (H) 8 - 23 mg/dL   Creatinine, Ser 2.48 (H) 0.61 - 1.24 mg/dL   Calcium 9.5 8.9 - 10.3 mg/dL   Phosphorus 5.0 (H) 2.5 - 4.6 mg/dL   Albumin 2.8 (L) 3.5 - 5.0 g/dL   GFR, Estimated 27 (L) >60 mL/min   Anion gap 15 5 - 15  Glucose, capillary     Status: Abnormal   Collection Time: 01/31/21  7:40 AM  Result Value Ref Range   Glucose-Capillary 168 (H) 70 - 99 mg/dL    Assessment & Plan: Present on Admission: **None**    LOS: 34 days   Additional comments:I reviewed the patient's new clinical lab test results.  . Fall down stairs 8/12   VDRF - guaifenisen, wean, S/P trach 8/29 by Dr. Bobbye Morton. Has tolerated HTC>24h ID - off abx, no fevers, completed maxipime/ampicillin 8/31, CT A/P with ascending colitis and distention. Stool studies and C. dif are all negative. 9/8 Resp cult Pseud - likely colonized.  TBI/SAH/SDH - NSGY c/s, Dr. Annette Stable. Significant frontal lobe injuries. Keppra x7d for sz ppx (completed) Occipital bone fx - NSGY c/s, Dr. Annette Stable Temporal bone fx extending into middle ear - ENT c/s, Dr. Constance Holster Right TM Rupture - ENT c/s, Dr. Constance Holster AFRVR -  back in AF during HD last night, amio drip per cardiology ABL anemia - stable Bilateral pulmonary embolism - bivalirudin, will transition to DOAC once stable off CRRT AKI - HD last night per Renal - went into AF then Hx DM2 - resistant SSI, novolog q4, increase glargine to 54u BID Hx HTN - PRN meds FEN - NPO, tolerating TF VTE - SCDs, bival gtt Dispo - ICU, HTC, therapies Critical Care Total Time*: 33 Minutes  Georganna Skeans, MD, MPH, FACS Trauma & General Surgery Use [email or web address] to contact on call provider  01/31/2021  *Care during the described time interval was provided by me. I have reviewed this patient's available data, including medical history, events of note, physical examination and test results as part of my evaluation.

## 2021-01-31 NOTE — Progress Notes (Signed)
[place] for bivalirudin Indication: atrial fibrillation, DVT, PE 8/23   No Known Allergies  Patient Measurements: Height: 6\' 2"  (188 cm) Weight: 108.1 kg (238 lb 5.1 oz) IBW/kg (Calculated) : 82.2 Heparin Dosing Weight: 107kg  Vital Signs: Temp: 98.3 F (36.8 C) (09/15 0800) Temp Source: Axillary (09/15 0800) BP: 118/66 (09/15 1100) Pulse Rate: 118 (09/15 1111)  Labs: Recent Labs    01/29/21 0317 01/29/21 1010 01/29/21 1543 01/29/21 1600 01/30/21 0521 01/30/21 1600 01/31/21 0520  HGB 8.2*  --   --   --  8.2*  --  7.2*  HCT 26.4*  --   --   --  26.5*  --  23.5*  PLT 157  --   --   --  172  --  152  APTT 48*   < > 53*  --  63*  --  64*  CREATININE 1.94*  --   --    < > 2.95* 3.51* 2.48*   < > = values in this interval not displayed.     Estimated Creatinine Clearance: 36.3 mL/min (A) (by C-G formula based on SCr of 2.48 mg/dL (H)).   Assessment: 84 YOM presenting s/p fall with TBI/SAH and facial fx, in afib started on amiodarone and now cleared per trauma for full dose anticoagulation. 8/23 patient found to have small acute bilateral PE and age-indeterminate LUE DVT. Pharmacy consulted to dose bivalirudin per Trauma.  Given recent head bleed, will aim for middle of therapeutic range aptt and watch closely for signs and symptoms of bleeding.   aPTT this morning is therapeutic (aPTT 64, goal of 50-65). Hgb down to 7.2 << 8.2 - will monitor. No bleeding or infusion issues noted at this time.   Goal of Therapy:  Aptt goal ~50-65s per discussion with Trauma  Monitor platelets by anticoagulation protocol: Yes   Plan:  Continue bivalirudin to 0.022 mg/kg/hr Monitor daily aPTT, CBC, s/sx bleeding F/u long-term plan for anticoagulation as appropriate  Thank you for allowing pharmacy to be a part of this patient's care.  Alycia Rossetti, PharmD, BCPS Clinical Pharmacist Clinical phone for 01/31/2021: [phone] 01/31/2021 11:50 AM    **Pharmacist phone directory can now be found on [email or web address] ([email or web address]).  Listed under [place].

## 2021-01-31 NOTE — Progress Notes (Signed)
[facility] NEPHROLOGY PROGRESS NOTE  Assessment/ Plan:  #Acute kidney injury, oliguric: Multifactorial etiology including ischemic ATN in the setting of hypotension, sepsis complicated by contrast injury. CRRT from 9/1-9/9.  No heparin as he is on bivalirudin.  The HD catheter was changed on 9/7.  CRRT restarted on 9/11 given elevated BUN and with more confusion, did not tolerated IHD on 9/10 (confusion, hypotension). CRRT clotted on 9/13 -no signs of renal recovery -tolerated HD overnight, tentatively planning for HD tomorrow -Continue to monitor strict ins and out, daily labs.  #Fall/bilateral subarachnoid hemorrhage/SDH, TBI/occipital and temporal bone fracture: Per trauma team.  Repeat CT scan with no acute finding.  #Acute respiratory failure: Status post trach on 8/29 and on vent.  #A. fib with RVR: On amiodarone, Angiomax.  # Anemia of critical illness: Transfuse as needed.  #Metabolic acidosis: Managed with dialysis.   #Bilateral pulm embolism: Currently on anticoagulation.  #Acute febrile illness: Per primary team.  #Hyperkalemia: on CRRT  Discussed with RN.  Subjective: Seen and examined in ICU. S/p IHD overnight, tolerated procedure well, seems to be in afib w/ rvr right now, on amio. Net uf ~900cc. Otherwise no acute events Objective Vital signs in last 24 hours: Vitals:   01/31/21 0600 01/31/21 0700 01/31/21 0800 01/31/21 0804  BP: 109/62 119/64 116/70   Pulse: (!) 113 (!) 115 (!) 119 (!) 114  Resp: _0 Temp:      TempSrc:      SpO2: 99% 98% 98% 98%  Weight:      Height:       Weight change:   Intake/Output Summary (Last 24 hours) at 01/31/2021 0830 Last data filed at 01/31/2021 0800 Gross per 24 hour  Intake 1460.38 ml  Output 1250 ml  Net 210.38 ml       Labs: Basic Metabolic Panel: Recent Labs  Lab 01/30/21 0521 01/30/21 1600 01/31/21 0520  NA 132* 133* 133*  K 5.5* 5.7* 4.4  CL 96* 95* 93*  CO2 22 20* 25   GLUCOSE 194* 228* 237*  BUN 99* 134* 83*  CREATININE 2.95* 3.51* 2.48*  CALCIUM 9.3 9.0 9.5  PHOS 6.1* 7.1* 5.0*   Liver Function Tests: Recent Labs  Lab 01/30/21 0521 01/30/21 1600 01/31/21 0520  ALBUMIN 2.6* 2.4* 2.8*   No results for input(s): LIPASE, AMYLASE in the last 168 hours. No results for input(s): AMMONIA in the last 168 hours. CBC: Recent Labs  Lab 01/27/21 0227 01/28/21 0319 01/29/21 0317 01/30/21 0521 01/31/21 0520  WBC 27.3* 11.7* 11.4* 14.0* 10.5  HGB 8.3*  9.9* 7.8* 8.2* 8.2* 7.2*  HCT 27.0*  29.0* 24.9* 26.4* 26.5* 23.5*  MCV 84.4 85.9 84.6 86.0 85.5  PLT 236 164 157 172 152   Cardiac Enzymes: No results for input(s): CKTOTAL, CKMB, CKMBINDEX, TROPONINI in the last 168 hours. CBG: Recent Labs  Lab 01/30/21 1633 01/30/21 1950 01/30/21 2338 01/31/21 0345 01/31/21 0740  GLUCAP 165* 194* 221* 243* 168*    Iron Studies: No results for input(s): IRON, TIBC, TRANSFERRIN, FERRITIN in the last 72 hours. Studies/Results: No results found.  Medications: Infusions:  sodium chloride     sodium chloride     sodium chloride     albumin human 25 g (01/30/21 2058)   amiodarone 30 mg/hr (01/31/21 0800)   bivalirudin (ANGIOMAX) infusion 0.5 mg/mL (Non-ACS indications) 0.022 mg/kg/hr (01/31/21 0800)   feeding supplement (NEPRO CARB STEADY) 1,000 mL (01/31/21 0015)    Scheduled Medications:  acetaminophen  1,000 mg Per Tube Q6H   chlorhexidine gluconate (MEDLINE KIT)  15 mL Mouth Rinse BID   Chlorhexidine Gluconate Cloth  6 each Topical Q0600   clonazePAM  0.5 mg Per Tube BID   docusate  100 mg Per Tube BID   feeding supplement (PROSource TF)  45 mL Per Tube BID   guaiFENesin  10 mL Per Tube Q4H   hydrALAZINE  25 mg Per Tube Q8H   insulin aspart  0-20 Units Subcutaneous Q4H   insulin aspart  10 Units Subcutaneous Q4H   insulin glargine-yfgn  54 Units Subcutaneous BID   mouth rinse  15 mL Mouth Rinse 10 times per day   methocarbamol  1,000 mg  Per Tube Q8H   pantoprazole sodium  40 mg Per Tube Daily   polyethylene glycol  17 g Per Tube Daily   QUEtiapine  100 mg Per Tube BID   senna  1 tablet Per Tube Daily   sodium chloride flush  10-40 mL Intracatheter Q12H    have reviewed scheduled and prn medications.  Physical Exam: General: nad, tracheostomy on vent. Heart: rrr Lungs: trach, cta bl Abdomen:soft, nontender. Extremities: Only trace dependent edema. Neurology:Alert awake  Dialysis Access: Left subclavian temporary HD catheter placed on 9/7.  Desarae Placide 01/31/2021,8:30 AM  LOS: 34 days

## 2021-01-31 NOTE — Progress Notes (Addendum)
Progress Note  Patient Name: Angel [name] Date of Encounter: 01/31/2021  Primary Cardiologist:   Werner Lean, MD   Subjective  Appears to be back in afib with RVR despite IV Amio.  Unable to communicate currently with trach  Inpatient Medications    Scheduled Meds:  acetaminophen  1,000 mg Per Tube Q6H   chlorhexidine gluconate (MEDLINE KIT)  15 mL Mouth Rinse BID   Chlorhexidine Gluconate Cloth  6 each Topical Q0600   clonazePAM  0.5 mg Per Tube BID   docusate  100 mg Per Tube BID   feeding supplement (PROSource TF)  45 mL Per Tube BID   guaiFENesin  10 mL Per Tube Q4H   hydrALAZINE  25 mg Per Tube Q8H   insulin aspart  0-20 Units Subcutaneous Q4H   insulin aspart  10 Units Subcutaneous Q4H   insulin glargine-yfgn  54 Units Subcutaneous BID   mouth rinse  15 mL Mouth Rinse 10 times per day   methocarbamol  1,000 mg Per Tube Q8H   pantoprazole sodium  40 mg Per Tube Daily   polyethylene glycol  17 g Per Tube Daily   QUEtiapine  100 mg Per Tube BID   senna  1 tablet Per Tube Daily   sodium chloride flush  10-40 mL Intracatheter Q12H   Continuous Infusions:  sodium chloride     sodium chloride     sodium chloride     albumin human 25 g (01/30/21 2058)   amiodarone 30 mg/hr (01/31/21 0800)   bivalirudin (ANGIOMAX) infusion 0.5 mg/mL (Non-ACS indications) 0.022 mg/kg/hr (01/31/21 0800)   feeding supplement (NEPRO CARB STEADY) 1,000 mL (01/31/21 0015)   PRN Meds: Place/Maintain arterial line **AND** sodium chloride, sodium chloride, sodium chloride, albumin human, alteplase, artificial tears, heparin, hydrALAZINE, HYDROmorphone (DILAUDID) injection, lidocaine (PF), lidocaine-prilocaine, midazolam, ondansetron **OR** ondansetron (ZOFRAN) IV, oxyCODONE, pentafluoroprop-tetrafluoroeth, sodium chloride flush   Vital Signs    Vitals:   01/31/21 0600 01/31/21 0700 01/31/21 0800 01/31/21 0804  BP: 109/62 119/64 116/70   Pulse: (!) 113 (!) 115 (!) 119 (!) 114  Resp:  18 19 19 20   Temp:      TempSrc:      SpO2: 99% 98% 98% 98%  Weight:      Height:        Intake/Output Summary (Last 24 hours) at 01/31/2021 0816 Last data filed at 01/31/2021 0800 Gross per 24 hour  Intake 1460.38 ml  Output 1250 ml  Net 210.38 ml    Filed Weights   01/29/21 0500 01/30/21 1950 01/30/21 2300  Weight: 108.7 kg 109 kg 108.1 kg    Telemetry  Atrial fibrillation with RVR- Personally Reviewed  ECG    NA - Personally Reviewed  Physical Exam   GEN: ill appearing HEENT:normal NECK: No JVD; trach in place LYMPHATICS: No lymphadenopathy CARDIAC:irregularly irregular and tachycardic, no murmurs, rubs, gallops RESPIRATORY:  Clear to auscultation without rales, wheezing or rhonchi  ABDOMEN: Soft, non-tender, non-distended MUSCULOSKELETAL:  No edema; No deformity  SKIN: Warm and dry NEUROLOGIC:  cannot assess PSYCHIATRIC:  cannot assess Labs    Chemistry Recent Labs  Lab 01/30/21 0521 01/30/21 1600 01/31/21 0520  NA 132* 133* 133*  K 5.5* 5.7* 4.4  CL 96* 95* 93*  CO2 22 20* 25  GLUCOSE 194* 228* 237*  BUN 99* 134* 83*  CREATININE 2.95* 3.51* 2.48*  CALCIUM 9.3 9.0 9.5  ALBUMIN 2.6* 2.4* 2.8*  GFRNONAA 22* 18* 27*  ANIONGAP 14 18* 15  Hematology Recent Labs  Lab 01/29/21 0317 01/30/21 0521 01/31/21 0520  WBC 11.4* 14.0* 10.5  RBC 3.12* 3.08* 2.75*  HGB 8.2* 8.2* 7.2*  HCT 26.4* 26.5* 23.5*  MCV 84.6 86.0 85.5  MCH 26.3 26.6 26.2  MCHC 31.1 30.9 30.6  RDW 17.9* 17.5* 17.9*  PLT 157 172 152     Cardiac EnzymesNo results for input(s): TROPONINI in the last 168 hours. No results for input(s): TROPIPOC in the last 168 hours.   BNPNo results for input(s): BNP, PROBNP in the last 168 hours.   DDimer No results for input(s): DDIMER in the last 168 hours.   Radiology    No results found.  Cardiac Studies   Echo 01/07/21: 1. Left ventricular ejection fraction, by estimation, is 60 to 65%. The  left ventricle has normal function.  The left ventricle has no regional  wall motion abnormalities. There is mild left ventricular hypertrophy.  Left ventricular diastolic parameters  are indeterminate.   2. Right ventricule is poorly visualized but grossly normal size and  systolic function   3. Left atrial size was mildly dilated.   4. Right atrial size was mildly dilated.   5. The mitral valve is normal in structure. No evidence of mitral valve  regurgitation. No evidence of mitral stenosis.   6. The aortic valve was not well visualized. Aortic valve regurgitation  is not visualized. No aortic stenosis is present.   Patient Profile     70 y.o. male with a PMH of hyperlipidemia, DM type II, who presented with fall resulting in subarachnoid hemorrhage requiring intubation with hospital course complicated by Multi lobar pneumonia, PE, sepsis, AKI requiring CRRT, and new onset atrial fibrillation/flutter for which cardiology is following.  Assessment & Plan    New onset paroxysmal atrial flutter:    -Atrial fib with rapid rate in the 130's >>he converted to NSR on Amio but now back in afib with RVR -On angiomax.     -Transition to PO anticoag when OK with primary team and no further invasive procedures planned.    -will bolus with 162m IV of Amio and continue IV Amio gtt  -hypotension has limited use of BB or CCB but improved when in NSR. -No BB due to hx of significant bradycardia earlier in hospitalization  Hypoxic respiratory failure:  -Hospital course complicated by multilobar PNA and bilateral PE -Continue vent management per primary team.   AKI:   -Unable to tolerate intermittent HD.   -Now on CRRT.    HTN -BP controlled at 116/773mg -will continue Hydralazine   I have spent a total of 35 minutes with patient reviewing hospital notes , telemetry, EKGs, labs and examining patient as well as establishing an assessment and plan that was discussed with the patient.  > 50% of time was spent in direct patient  care.      For questions or updates, please contact [facility] consult [email or web address] for contact info under Cardiology/STEMI.   Signed, TrFransico HimMD  01/31/2021, 8:16 AM

## 2021-02-01 DIAGNOSIS — I4891 Unspecified atrial fibrillation: Secondary | ICD-10-CM | POA: Diagnosis not present

## 2021-02-01 LAB — RENAL FUNCTION PANEL
Albumin: 2.5 g/dL — ABNORMAL LOW (ref 3.5–5.0)
Albumin: 2.4 g/dL — ABNORMAL LOW (ref 3.5–5.0)
Anion gap: 18 — ABNORMAL HIGH (ref 5–15)
Anion gap: 16 — ABNORMAL HIGH (ref 5–15)
BUN: 134 mg/dL — ABNORMAL HIGH (ref 8–23)
BUN: 138 mg/dL — ABNORMAL HIGH (ref 8–23)
CO2: 22 mmol/L (ref 22–32)
CO2: 23 mmol/L (ref 22–32)
Calcium: 10 mg/dL (ref 8.9–10.3)
Calcium: 9.1 mg/dL (ref 8.9–10.3)
Chloride: 93 mmol/L — ABNORMAL LOW (ref 98–111)
Chloride: 94 mmol/L — ABNORMAL LOW (ref 98–111)
Creatinine, Ser: 3.74 mg/dL — ABNORMAL HIGH (ref 0.61–1.24)
Creatinine, Ser: 3.64 mg/dL — ABNORMAL HIGH (ref 0.61–1.24)
GFR, Estimated: 17 mL/min — ABNORMAL LOW (ref >60)
GFR, Estimated: 17 mL/min — ABNORMAL LOW (ref >60)
Glucose, Bld: 172 mg/dL — ABNORMAL HIGH (ref 70–99)
Glucose, Bld: 122 mg/dL — ABNORMAL HIGH (ref 70–99)
Phosphorus: 7.6 mg/dL — ABNORMAL HIGH (ref 2.5–4.6)
Phosphorus: 7.8 mg/dL — ABNORMAL HIGH (ref 2.5–4.6)
Potassium: 4.3 mmol/L (ref 3.5–5.1)
Potassium: 4 mmol/L (ref 3.5–5.1)
Sodium: 133 mmol/L — ABNORMAL LOW (ref 135–145)
Sodium: 133 mmol/L — ABNORMAL LOW (ref 135–145)

## 2021-02-01 LAB — GLUCOSE, CAPILLARY
Glucose-Capillary: 129 mg/dL — ABNORMAL HIGH (ref 70–99)
Glucose-Capillary: 128 mg/dL — ABNORMAL HIGH (ref 70–99)
Glucose-Capillary: 180 mg/dL — ABNORMAL HIGH (ref 70–99)
Glucose-Capillary: 111 mg/dL — ABNORMAL HIGH (ref 70–99)
Glucose-Capillary: 117 mg/dL — ABNORMAL HIGH (ref 70–99)
Glucose-Capillary: 131 mg/dL — ABNORMAL HIGH (ref 70–99)

## 2021-02-01 LAB — CBC
HCT: 22.6 % — ABNORMAL LOW (ref 39.0–52.0)
Hemoglobin: 7 g/dL — ABNORMAL LOW (ref 13.0–17.0)
MCH: 26.6 pg (ref 26.0–34.0)
MCHC: 31 g/dL (ref 30.0–36.0)
MCV: 85.9 fL (ref 80.0–100.0)
Platelets: 192 10*3/uL (ref 150–400)
RBC: 2.63 MIL/uL — ABNORMAL LOW (ref 4.22–5.81)
RDW: 18 % — ABNORMAL HIGH (ref 11.5–15.5)
WBC: 10.8 10*3/uL — ABNORMAL HIGH (ref 4.0–10.5)
nRBC: 0 % (ref 0.0–0.2)

## 2021-02-01 LAB — APTT
aPTT: 133 s — ABNORMAL HIGH (ref 24–36)
aPTT: 62 s — ABNORMAL HIGH (ref 24–36)

## 2021-02-01 LAB — MAGNESIUM: Magnesium: 2.9 mg/dL — ABNORMAL HIGH (ref 1.7–2.4)

## 2021-02-01 MED ORDER — APIXABAN 5 MG PO TABS
5.0000 mg | ORAL_TABLET | Freq: Two times a day (BID) | ORAL | Status: DC
  Administered 2021-02-01 – 2021-02-12 (×22): 5 mg
  Filled 2021-02-01 (×23): qty 1

## 2021-02-01 MED ORDER — AMIODARONE LOAD VIA INFUSION
150.0000 mg | Freq: Once | INTRAVENOUS | Status: AC
  Administered 2021-02-01: 150 mg via INTRAVENOUS

## 2021-02-01 MED ORDER — HEPARIN SODIUM (PORCINE) 1000 UNIT/ML IJ SOLN
1000.0000 [IU] | Freq: Once | INTRAMUSCULAR | Status: AC
  Administered 2021-02-01: 1000 [IU] via INTRAVENOUS

## 2021-02-01 MED ORDER — APIXABAN 5 MG PO TABS
5.0000 mg | ORAL_TABLET | Freq: Two times a day (BID) | ORAL | Status: DC
  Filled 2021-02-01: qty 1

## 2021-02-01 MED ORDER — ALBUMIN HUMAN 25 % IV SOLN
25.0000 g | INTRAVENOUS | Status: DC | PRN
  Administered 2021-02-06: 25 g via INTRAVENOUS
  Filled 2021-02-01 (×2): qty 100

## 2021-02-01 NOTE — Progress Notes (Signed)
[facility] NEPHROLOGY PROGRESS NOTE  Assessment/ Plan:  #Acute kidney injury, oliguric: Multifactorial etiology including ischemic ATN in the setting of hypotension, sepsis complicated by contrast injury. CRRT from 9/1-9/9.  No heparin as he is on bivalirudin.  The HD catheter was changed on 9/7.  CRRT restarted on 9/11 given elevated BUN and with more confusion, did not tolerated IHD on 9/10 (confusion, hypotension). CRRT clotted off on 9/13. Did relatively okay with HD 9/14-9/15 overnight -no signs of renal recovery -HD today -Continue to monitor strict ins and out, daily labs.  #Fall/bilateral subarachnoid hemorrhage/SDH, TBI/occipital and temporal bone fracture: Per trauma team.  Repeat CT scan with no acute finding.  #Acute respiratory failure: Status post trach on 8/29, trach collar  #A. fib with RVR: On amiodarone, Angiomax.  # Anemia of critical illness: Transfuse as needed.  #Metabolic acidosis: Managed with dialysis. resolved  #Bilateral pulm embolism: Currently on anticoagulation.  #Acute febrile illness: Per primary team.  #Hyperkalemia:  K wnl now, managing with HD  Discussed with RN.  Subjective: Seen and examined in ICU. No acute events overnight. Objective Vital signs in last 24 hours: Vitals:   02/01/21 0730 02/01/21 0754 02/01/21 0800 02/01/21 1054  BP:   (!) 110/52   Pulse: (!) 110 (!) 110 (!) 106 (!) 122  Resp: _0 Temp:   99.8 F (37.7 C)   TempSrc:   Oral   SpO2: 100% 100% 99% 98%  Weight:      Height:       Weight change: -1.5 kg  Intake/Output Summary (Last 24 hours) at 02/01/2021 1153 Last data filed at 02/01/2021 0800 Gross per 24 hour  Intake 1727.12 ml  Output 300 ml  Net 1427.12 ml       Labs: Basic Metabolic Panel: Recent Labs  Lab 01/31/21 0520 01/31/21 1632 02/01/21 0445  NA 133* 134* 133*  K 4.4 4.5 4.3  CL 93* 97* 93*  CO2 _1 GLUCOSE 237* 153* 172*  BUN 83* 108* 134*  CREATININE 2.48*  3.07* 3.74*  CALCIUM 9.5 9.1 10.0  PHOS 5.0* 6.5* 7.6*   Liver Function Tests: Recent Labs  Lab 01/31/21 0520 01/31/21 1632 02/01/21 0445  ALBUMIN 2.8* 2.6* 2.5*   No results for input(s): LIPASE, AMYLASE in the last 168 hours. No results for input(s): AMMONIA in the last 168 hours. CBC: Recent Labs  Lab 01/28/21 0319 01/29/21 0317 01/30/21 0521 01/31/21 0520 02/01/21 0445  WBC 11.7* 11.4* 14.0* 10.5 10.8*  HGB 7.8* 8.2* 8.2* 7.2* 7.0*  HCT 24.9* 26.4* 26.5* 23.5* 22.6*  MCV 85.9 84.6 86.0 85.5 85.9  PLT 164 157 172 152 192   Cardiac Enzymes: No results for input(s): CKTOTAL, CKMB, CKMBINDEX, TROPONINI in the last 168 hours. CBG: Recent Labs  Lab 01/31/21 1929 01/31/21 2317 02/01/21 0321 02/01/21 0806 02/01/21 1141  GLUCAP 147* 123* 129* 128* 180*    Iron Studies: No results for input(s): IRON, TIBC, TRANSFERRIN, FERRITIN in the last 72 hours. Studies/Results: No results found.  Medications: Infusions:  sodium chloride     sodium chloride     sodium chloride     albumin human 25 g (01/30/21 2058)   amiodarone 30 mg/hr (02/01/21 0800)   feeding supplement (NEPRO CARB STEADY) 60 mL/hr at 02/01/21 0600    Scheduled Medications:  acetaminophen  1,000 mg Per Tube Q6H   apixaban  5 mg Per Tube BID   chlorhexidine gluconate (MEDLINE KIT)  15 mL Mouth Rinse  BID   Chlorhexidine Gluconate Cloth  6 each Topical Q0600   docusate  100 mg Per Tube BID   feeding supplement (PROSource TF)  45 mL Per Tube BID   guaiFENesin  10 mL Per Tube Q4H   hydrALAZINE  25 mg Per Tube Q8H   insulin aspart  0-20 Units Subcutaneous Q4H   insulin aspart  10 Units Subcutaneous Q4H   insulin glargine-yfgn  60 Units Subcutaneous BID   mouth rinse  15 mL Mouth Rinse 10 times per day   methocarbamol  1,000 mg Per Tube Q8H   pantoprazole sodium  40 mg Per Tube Daily   polyethylene glycol  17 g Per Tube Daily   QUEtiapine  50 mg Per Tube QHS   senna  1 tablet Per Tube Daily   sodium  chloride flush  10-40 mL Intracatheter Q12H    have reviewed scheduled and prn medications.  Physical Exam: General: nad, tracheostomy on vent. Heart: rrr Lungs: trach, cta bl Abdomen:soft, nontender. Extremities: Only trace dependent edema. Neurology:Alert awake  Dialysis Access: Left subclavian temporary HD catheter placed on 9/7.  Angel [name] 02/01/2021,11:53 AM  LOS: 35 days

## 2021-02-01 NOTE — Progress Notes (Signed)
ATC setup changed 

## 2021-02-01 NOTE — Progress Notes (Signed)
Arrived to room 4N26 to begin HD treatment, hoping to remove 1L  Pt vss and assessment stable, afib on monitor with some tachy.  RO machine attached to bathroom faucet, however, alarmed insufficient supply. Another RO machine was brought in.  Dialysis machine also alarming NO WATER constantly, with some brief reprieves between alarms.  After switching RO, restarting HD machine, appeared that the pressure would hold and pt started on treatment. However, lost water again, and after nearly 10 minutes without improvement despite troubleshooting, pt rinsed back.  Faucet pressure increased with the help of [facility] facilities, and for about 5 minutes, seemed that it was holding, so pt treatment was restarted. About 1 minute and water was lost.  Communicating between MD, charge, and bedside RN, pt was rinsed back a second & final time, and rescheduled for tomorrow.  Stable upon departure

## 2021-02-01 NOTE — Progress Notes (Addendum)
Physical Therapy Treatment Patient Details Name: Angel [name] MRN: [id number] DOB: 1950-08-14 Today's Date: 02/01/2021   History of Present Illness Angel [name] is a 70 y.o. 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 CT showed R temporal and parietal SAH, SAH anterior frontal lobes  bilaterally. Also sustained right occipital skull fracture, temporal bone fx, right TM rupture, bilateral PE. Intubated 8/12, trach'd 8/29. CRRT 9/1- 9/9.  PMH: DM2, HTN    PT Comments    Session focused on OOB mobility to chair with use of Maxisky. Patient able to roll with maxA+2 this date with patient initiating roll with cues for looking towards direction he was rolling and reaching. Patient tolerated transfer well with VSS (see transfer section). Patient with movement in L LE at toes, hamstrings, and hip adductors. Continue to recommend LTACH following discharge.     Recommendations for follow up therapy are one component of a multi-disciplinary discharge planning process, led by the attending physician.  Recommendations may be updated based on patient status, additional functional criteria and insurance authorization.  Follow Up Recommendations  LTACH     Equipment Recommendations  Other (comment) (TBD)    Recommendations for Other Services       Precautions / Restrictions Precautions Precautions: Fall Precaution Comments: trach collar cortrak watch BP / HR, flexiseal Restrictions Weight Bearing Restrictions: No     Mobility  Bed Mobility Overal bed mobility: Needs Assistance Bed Mobility: Rolling Rolling: Max assist;+2 for physical assistance         General bed mobility comments: Patient able to initiate rolling L/R with verbal cues for lift pad placement. Cues for looking towards direction he is going and reaching with UE    Transfers Overall transfer level: Needs assistance               General transfer comment: use of Maxisky for OOB to chair. VSS throughout. Prior  to movement, BP 127/80. Once in chair, BP 127/85, HR max 132, and spO2 on 5L O2 FiO2 28% 100%  Ambulation/Gait                 Stairs             Wheelchair Mobility    Modified Rankin (Stroke Patients Only) Modified Rankin (Stroke Patients Only) Pre-Morbid Rankin Score: No symptoms Modified Rankin: Severe disability     Balance                                            Cognition Arousal/Alertness: Awake/alert Behavior During Therapy: Flat affect Overall Cognitive Status: Difficult to assess Area of Impairment: Rancho level               Rancho Levels of Cognitive Functioning Rancho Los Amigos Scales of Cognitive Functioning: Confused/inappropriate/non-agitated               General Comments: Following simple commands with increased time. Requires cues to attend to L side for muscle activation      Exercises General Exercises - Lower Extremity Ankle Circles/Pumps: PROM;Both;5 reps;Supine Heel Slides: PROM;Both;5 reps;Supine    General Comments        Pertinent Vitals/Pain Pain Assessment: Faces Faces Pain Scale: Hurts little more Pain Location: L LE movement Pain Descriptors / Indicators: Grimacing Pain Intervention(s): Monitored during session    Home Living  Prior Function            PT Goals (current goals can now be found in the care plan section) Acute Rehab PT Goals Patient Stated Goal: none stated by patient PT Goal Formulation: With patient/family Time For Goal Achievement: 02/07/21 Potential to Achieve Goals: Fair Progress towards PT goals: Progressing toward goals    Frequency    Min 3X/week      PT Plan Current plan remains appropriate    Co-evaluation PT/OT/SLP Co-Evaluation/Treatment: Yes Reason for Co-Treatment: Complexity of the patient's impairments (multi-system involvement);For patient/therapist safety PT goals addressed during session: Mobility/safety  with mobility        AM-PAC PT "6 Clicks" Mobility   Outcome Measure  Help needed turning from your back to your side while in a flat bed without using bedrails?: Total Help needed moving from lying on your back to sitting on the side of a flat bed without using bedrails?: Total Help needed moving to and from a bed to a chair (including a wheelchair)?: Total Help needed standing up from a chair using your arms (e.g., wheelchair or bedside chair)?: Total Help needed to walk in hospital room?: Total Help needed climbing 3-5 steps with a railing? : Total 6 Click Score: 6    End of Session Equipment Utilized During Treatment: Oxygen (trach collar) Activity Tolerance: Patient tolerated treatment well Patient left: in chair;with call bell/phone within reach;with family/visitor present Nurse Communication: Mobility status PT Visit Diagnosis: Unsteadiness on feet (R26.81);Muscle weakness (generalized) (M62.81);Difficulty in walking, not elsewhere classified (R26.2)     Time: 0277-4128 PT Time Calculation (min) (ACUTE ONLY): 49 min  Charges:  $Therapeutic Activity: 23-37 mins                     Telesha Deguzman A. Gilford Rile PT, DPT Acute Rehabilitation Services Pager [phone] Office [phone]    Linna Hoff 02/01/2021, 2:39 PM

## 2021-02-01 NOTE — Progress Notes (Signed)
Speech Language Pathology Treatment: [name] Speaking valve;Cognitive-Linquistic  Patient Details Name: Angel [name] MRN: [id number] DOB: 02/12/1951 Today's Date: 02/01/2021 Time: 1937-9024 SLP Time Calculation (min) (ACUTE ONLY): 23 min  Assessment / Plan / Recommendation Clinical Impression  Pt has progressed to trach collar full time. Seen up in chair following PT/OT session. He was lethargic needing minimal cues to remain interactive. Pt's wife and son present and therapist educated re: PMV function, usage and plan. Cuffed deflated at baseline and wore valve for 15 min. His vocal quality was hoarse with low intensity and intelligibility reduced this session likely exacerbated by sleepiness. Cues for strategies to increase volume were not effective. He had difficulty accurately responding to biographical questions while lethargic and sustained attention to therapist was fair-poor. Overall, pt making progress toward goals.    HPI HPI: Angel [name] is a 70 y.o. 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 CT showed R temporal and parietal SAH, SAH anterior frontal lobes  bilaterally. Also sustained right occipital skull fracture, temporal bone fx, right TM rupture, bilateral PE. Intubated 8/12, trach'd 8/29. PMH: DM2, HTN      SLP Plan  Continue with current plan of care      Recommendations for follow up therapy are one component of a multi-disciplinary discharge planning process, led by the attending physician.  Recommendations may be updated based on patient status, additional functional criteria and insurance authorization.    Recommendations         Patient may use Passy-Muir Speech Valve: During all therapies with supervision PMSV Supervision: Full MD: Please consider changing trach tube to : Cuffless         Oral Care Recommendations: Oral care QID Follow up Recommendations: LTACH SLP Visit Diagnosis: Aphonia (R49.1);Cognitive communication deficit (O97.353) Plan:  Continue with current plan of care                       Angel [name]  02/01/2021, 2:53 PM

## 2021-02-01 NOTE — Progress Notes (Signed)
Trauma/Critical Care Follow Up Note  Subjective:    Overnight Issues:   Objective:  Vital signs for last 24 hours: Temp:  [98.2 F (36.8 C)-99.8 F (37.7 C)] 99.8 F (37.7 C) (09/16 0800) Pulse Rate:  [103-125] 122 (09/16 1054) Resp:  [13-25] 18 (09/16 1054) BP: (107-143)/(47-76) 110/52 (09/16 0800) SpO2:  [97 %-100 %] 98 % (09/16 1054) FiO2 (%):  [28 %] 28 % (09/16 1054) Weight:  [107.5 kg] 107.5 kg (09/16 0455)  Hemodynamic parameters for last 24 hours:    Intake/Output from previous day: 09/15 0701 - 09/16 0700 In: 2028.9 [I.V.:648.9; NG/GT:1380] Out: 300 [Stool:300]  Intake/Output this shift: Total I/O In: 162.6 [I.V.:42.6; NG/GT:120] Out: -   Vent settings for last 24 hours: FiO2 (%):  [28 %] 28 %  Physical Exam:  Gen: comfortable, no distress Neuro: not f/c HEENT: PERRL Neck: supple CV: RRR Pulm: unlabored breathing Abd: soft, NT, tympanitic GU: unmeasured urine, HD today Extr: wwp, no edema   Results for orders placed or performed during the hospital encounter of 12/28/20 (from the past 24 hour(s))  Glucose, capillary     Status: Abnormal   Collection Time: 01/31/21 11:59 AM  Result Value Ref Range   Glucose-Capillary 156 (H) 70 - 99 mg/dL  Glucose, capillary     Status: Abnormal   Collection Time: 01/31/21  3:34 PM  Result Value Ref Range   Glucose-Capillary 131 (H) 70 - 99 mg/dL  Renal function panel (daily at 1600)     Status: Abnormal   Collection Time: 01/31/21  4:32 PM  Result Value Ref Range   Sodium 134 (L) 135 - 145 mmol/L   Potassium 4.5 3.5 - 5.1 mmol/L   Chloride 97 (L) 98 - 111 mmol/L   CO2 22 22 - 32 mmol/L   Glucose, Bld 153 (H) 70 - 99 mg/dL   BUN 108 (H) 8 - 23 mg/dL   Creatinine, Ser 3.07 (H) 0.61 - 1.24 mg/dL   Calcium 9.1 8.9 - 10.3 mg/dL   Phosphorus 6.5 (H) 2.5 - 4.6 mg/dL   Albumin 2.6 (L) 3.5 - 5.0 g/dL   GFR, Estimated 21 (L) >60 mL/min   Anion gap 15 5 - 15  Glucose, capillary     Status: Abnormal   Collection  Time: 01/31/21  7:29 PM  Result Value Ref Range   Glucose-Capillary 147 (H) 70 - 99 mg/dL  Glucose, capillary     Status: Abnormal   Collection Time: 01/31/21 11:17 PM  Result Value Ref Range   Glucose-Capillary 123 (H) 70 - 99 mg/dL  Glucose, capillary     Status: Abnormal   Collection Time: 02/01/21  3:21 AM  Result Value Ref Range   Glucose-Capillary 129 (H) 70 - 99 mg/dL  APTT     Status: Abnormal   Collection Time: 02/01/21  4:45 AM  Result Value Ref Range   aPTT 133 (H) 24 - 36 seconds  Renal function panel (daily at 0500)     Status: Abnormal   Collection Time: 02/01/21  4:45 AM  Result Value Ref Range   Sodium 133 (L) 135 - 145 mmol/L   Potassium 4.3 3.5 - 5.1 mmol/L   Chloride 93 (L) 98 - 111 mmol/L   CO2 22 22 - 32 mmol/L   Glucose, Bld 172 (H) 70 - 99 mg/dL   BUN 134 (H) 8 - 23 mg/dL   Creatinine, Ser 3.74 (H) 0.61 - 1.24 mg/dL   Calcium 10.0 8.9 - 10.3 mg/dL  Phosphorus 7.6 (H) 2.5 - 4.6 mg/dL   Albumin 2.5 (L) 3.5 - 5.0 g/dL   GFR, Estimated 17 (L) >60 mL/min   Anion gap 18 (H) 5 - 15  Magnesium     Status: Abnormal   Collection Time: 02/01/21  4:45 AM  Result Value Ref Range   Magnesium 2.9 (H) 1.7 - 2.4 mg/dL  CBC     Status: Abnormal   Collection Time: 02/01/21  4:45 AM  Result Value Ref Range   WBC 10.8 (H) 4.0 - 10.5 K/uL   RBC 2.63 (L) 4.22 - 5.81 MIL/uL   Hemoglobin 7.0 (L) 13.0 - 17.0 g/dL   HCT 22.6 (L) 39.0 - 52.0 %   MCV 85.9 80.0 - 100.0 fL   MCH 26.6 26.0 - 34.0 pg   MCHC 31.0 30.0 - 36.0 g/dL   RDW 18.0 (H) 11.5 - 15.5 %   Platelets 192 150 - 400 K/uL   nRBC 0.0 0.0 - 0.2 %  APTT     Status: Abnormal   Collection Time: 02/01/21  7:38 AM  Result Value Ref Range   aPTT 62 (H) 24 - 36 seconds  Glucose, capillary     Status: Abnormal   Collection Time: 02/01/21  8:06 AM  Result Value Ref Range   Glucose-Capillary 128 (H) 70 - 99 mg/dL    Assessment & Plan: The plan of care was discussed with the bedside nurse for the day, Roselyn Reef, who is  in agreement with this plan and no additional concerns were raised.   Present on Admission: **None**    LOS: 35 days   Additional comments:I reviewed the patient's new clinical lab test results.   and I reviewed the patients new imaging test results.    Fall down stairs 8/12   VDRF - guaifenisen, wean, S/P trach 8/29 by Dr. Bobbye Morton. Has tolerated HTC>24h ID - off abx, no fevers, completed maxipime/ampicillin 8/31, CT A/P with ascending colitis and distention. Stool studies and C. dif are all negative. 9/8 Resp cult Pseud - likely colonized.  TBI/SAH/SDH - NSGY c/s, Dr. Annette Stable. Significant frontal lobe injuries. Keppra x7d for sz ppx (completed) Occipital bone fx - NSGY c/s, Dr. Annette Stable Temporal bone fx extending into middle ear - ENT c/s, Dr. Constance Holster Right TM Rupture - ENT c/s, Dr. Constance Holster AFRVR -  amio drip per cardiology ABL anemia - stable Bilateral pulmonary embolism - bivalirudin to DOAC  AKI - HD planned for today Hx DM2 - resistant SSI, novolog q4, increase glargine to 54u BID Hx HTN - PRN meds FEN - NPO, tolerating TF VTE - SCDs, bival gtt to DOAC today Dispo - 4NP, HTC, therapies   Jesusita Oka, MD Trauma & General Surgery Please use [email or web address] to contact on call provider  02/01/2021  *Care during the described time interval was provided by me. I have reviewed this patient's available data, including medical history, events of note, physical examination and test results as part of my evaluation.

## 2021-02-01 NOTE — Progress Notes (Signed)
Progress Note  Patient Name: Angel [name] Date of Encounter: 02/01/2021  Primary Cardiologist:   Werner Lean, MD   Subjective  Unable to communicate currently with trach.  Went back into afib with RVR yesterday despite several days on IV AMio.  Remains in afib with RVR  Inpatient Medications    Scheduled Meds:  acetaminophen  1,000 mg Per Tube Q6H   apixaban  5 mg Per Tube BID   chlorhexidine gluconate (MEDLINE KIT)  15 mL Mouth Rinse BID   Chlorhexidine Gluconate Cloth  6 each Topical Q0600   docusate  100 mg Per Tube BID   feeding supplement (PROSource TF)  45 mL Per Tube BID   guaiFENesin  10 mL Per Tube Q4H   hydrALAZINE  25 mg Per Tube Q8H   insulin aspart  0-20 Units Subcutaneous Q4H   insulin aspart  10 Units Subcutaneous Q4H   insulin glargine-yfgn  60 Units Subcutaneous BID   mouth rinse  15 mL Mouth Rinse 10 times per day   methocarbamol  1,000 mg Per Tube Q8H   pantoprazole sodium  40 mg Per Tube Daily   polyethylene glycol  17 g Per Tube Daily   QUEtiapine  50 mg Per Tube QHS   senna  1 tablet Per Tube Daily   sodium chloride flush  10-40 mL Intracatheter Q12H   Continuous Infusions:  sodium chloride     sodium chloride     sodium chloride     albumin human 25 g (01/30/21 2058)   amiodarone 30 mg/hr (02/01/21 0800)   feeding supplement (NEPRO CARB STEADY) 60 mL/hr at 02/01/21 0600   PRN Meds: Place/Maintain arterial line **AND** sodium chloride, sodium chloride, sodium chloride, albumin human, alteplase, artificial tears, heparin, hydrALAZINE, HYDROmorphone (DILAUDID) injection, lidocaine (PF), lidocaine-prilocaine, midazolam, ondansetron **OR** ondansetron (ZOFRAN) IV, oxyCODONE, pentafluoroprop-tetrafluoroeth, sodium chloride flush   Vital Signs    Vitals:   02/01/21 0700 02/01/21 0730 02/01/21 0754 02/01/21 0800  BP: 111/67   (!) 110/52  Pulse: (!) 104 (!) 110 (!) 110 (!) 106  Resp: _0 Temp:    99.8 F (37.7 C)  TempSrc:     Oral  SpO2: 99% 100% 100% 99%  Weight:      Height:        Intake/Output Summary (Last 24 hours) at 02/01/2021 0939 Last data filed at 02/01/2021 0800 Gross per 24 hour  Intake 1982.48 ml  Output 300 ml  Net 1682.48 ml    Filed Weights   01/30/21 1950 01/30/21 2300 02/01/21 0455  Weight: 109 kg 108.1 kg 107.5 kg    Telemetry  Atrial fibrillation with RVR- Personally Reviewed  ECG    NA - Personally Reviewed  Physical Exam   GEN: ill appearing HEENT: Normal NECK: difficult to assess JVD due to body habitus.  Trach present LYMPHATICS: No lymphadenopathy CARDIAC:irregularly irregular and tachy, no murmurs, rubs, gallops RESPIRATORY:  Clear to auscultation without rales, wheezing or rhonchi  ABDOMEN: Soft, non-tender, non-distended MUSCULOSKELETAL:  No edema; No deformity  SKIN: Warm and dry NEUROLOGIC:  cannot assess PSYCHIATRIC: cannot assess Labs    Chemistry Recent Labs  Lab 01/31/21 0520 01/31/21 1632 02/01/21 0445  NA 133* 134* 133*  K 4.4 4.5 4.3  CL 93* 97* 93*  CO2 _1 GLUCOSE 237* 153* 172*  BUN 83* 108* 134*  CREATININE 2.48* 3.07* 3.74*  CALCIUM 9.5 9.1 10.0  ALBUMIN 2.8* 2.6* 2.5*  GFRNONAA 27* 21*  17*  ANIONGAP 15 15 18*      Hematology Recent Labs  Lab 01/30/21 0521 01/31/21 0520 02/01/21 0445  WBC 14.0* 10.5 10.8*  RBC 3.08* 2.75* 2.63*  HGB 8.2* 7.2* 7.0*  HCT 26.5* 23.5* 22.6*  MCV 86.0 85.5 85.9  MCH 26.6 26.2 26.6  MCHC 30.9 30.6 31.0  RDW 17.5* 17.9* 18.0*  PLT 172 152 192     Cardiac EnzymesNo results for input(s): TROPONINI in the last 168 hours. No results for input(s): TROPIPOC in the last 168 hours.   BNPNo results for input(s): BNP, PROBNP in the last 168 hours.   DDimer No results for input(s): DDIMER in the last 168 hours.   Radiology    No results found.  Cardiac Studies   Echo 01/07/21: 1. Left ventricular ejection fraction, by estimation, is 60 to 65%. The  left ventricle has normal function.  The left ventricle has no regional  wall motion abnormalities. There is mild left ventricular hypertrophy.  Left ventricular diastolic parameters  are indeterminate.   2. Right ventricule is poorly visualized but grossly normal size and  systolic function   3. Left atrial size was mildly dilated.   4. Right atrial size was mildly dilated.   5. The mitral valve is normal in structure. No evidence of mitral valve  regurgitation. No evidence of mitral stenosis.   6. The aortic valve was not well visualized. Aortic valve regurgitation  is not visualized. No aortic stenosis is present.   Patient Profile     70 y.o. male with a PMH of hyperlipidemia, DM type II, who presented with fall resulting in subarachnoid hemorrhage requiring intubation with hospital course complicated by Multi lobar pneumonia, PE, sepsis, AKI requiring CRRT, and new onset atrial fibrillation/flutter for which cardiology is following.  Assessment & Plan    New onset paroxysmal atrial flutter:    -Atrial fib with rapid rate in the 130's >>he converted to NSR on Amio but now back in afib with RVR -On angiomax.     -Transition to PO anticoag when OK with primary team and no further invasive procedures planned.    -continue with IV Amio gtt and PRN bolus to control HR -hypotension has limited use of BB or CCB and had significant bradycardia earlier in hospitalization so will keep with Amio for now  Hypoxic respiratory failure:  -Hospital course complicated by multilobar PNA and bilateral PE -Continue vent management per primary team.   AKI:   -Unable to tolerate intermittent HD.   -Now on CRRT.    HTN -BP controlled at 116/80mHg -will continue Hydralazine 271mq8 hours per tube    For questions or updates, please contact [facility] consult [email or web address] for contact info under Cardiology/STEMI.   Signed, TrFransico HimMD  02/01/2021, 9:39 AM

## 2021-02-01 NOTE — Progress Notes (Addendum)
Occupational Therapy Treatment Note  Seen as cotreat with PT.Excellent session with family present. Pt participated in bed mobility to clean and place clean pad prior to mobilizing OOB to recliener with use of Maxisky. Following 1 step commands consistently with improved level of arousal and attention to task. Increased mobemetn BUE, however RUE overall stronger than L. Completed BUE A/AAROM/strengthening in supine. Family present and educated on need to complete BUE AROM/ strengthening. Will benefit from written HEP. Issued squeeze ball. VSS throughout session on 5L/29% FiO2 with Max HR high 120s but not sustained; BP 127/85 end of session in recliner. Will continue to follow acutely. Will further assess potential to switch to Tilt/Verticalization bed to help progress rehab next week.    02/01/21 1437  OT Visit Information  Last OT Received On 02/01/21  Assistance Needed +2  PT/OT/SLP Co-Evaluation/Treatment Yes  Reason for Co-Treatment Complexity of the patient's impairments (multi-system involvement);For patient/therapist safety;To address functional/ADL transfers  OT goals addressed during session ADL's and self-care  History of Present Illness Angel [name] is a 70 y.o. 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 CT showed R temporal and parietal SAH, SAH anterior frontal lobes  bilaterally. Also sustained right occipital skull fracture, temporal bone fx, right TM rupture, bilateral PE. Intubated 8/12, trach'd 8/29. CRRT 9/1- 9/9.  PMH: DM2, HTN  Precautions  Precautions Fall  Precaution Comments trach collar cortrak watch BP / HR, flexiseal  Required Braces or Orthoses  (B Prevalon)  Pain Assessment  Pain Assessment Faces  Faces Pain Scale 4  Pain Location L LE movement  Pain Descriptors / Indicators Grimacing  Pain Intervention(s) Limited activity within patient's tolerance  Cognition  Arousal/Alertness Awake/alert  Behavior During Therapy Flat affect  Overall Cognitive  Status Difficult to assess  Area of Impairment Rancho level  General Comments following 1 step commands consistently; improved affect; smiled a couple of times; able to sustain attention to tasks; will further assess  Difficult to assess due to Tracheostomy (ST trialed PMSV however pt not verbalizing this session)  Upper Extremity Assessment  Upper Extremity Assessment  (weak)  LUE Deficits / Details weak  Lower Extremity Assessment  Lower Extremity Assessment Defer to PT evaluation  ADL  Eating/Feeding NPO  Grooming Maximal assistance  Grooming Details (indicate cue type and reason) Able to hlep wash face  Upper Body Bathing Total assistance  Lower Body Bathing Total assistance  Functional mobility during ADLs Total assistance;+2 for physical assistance  General ADL Comments Pt using R hand to wash face. RUE stronger than L  Bed Mobility  Overal bed mobility Needs Assistance  Bed Mobility Rolling  Rolling Max assist;+2 for physical assistance  General bed mobility comments Patient able to initiate rolling L/R with verbal cues for lift pad placement. Cues for looking towards direction he is going and reaching with UE; able to assist with flexing knee; helping to hold in sidelying  Balance  Sitting balance-Leahy Scale Poor  Vision- Assessment  Additional Comments will further assess; able to sustain gaze to target; conjugate gaze  Rancho Levels of Cognitive Functioning  Rancho [email or web address].es Scales of Cognitive Functioning V  Transfers  Overall transfer level Needs assistance  Transfer via [place] transfer comment use of Maxisky for OOB to chair. VSS throughout.  General Comments  General comments (skin integrity, edema, etc.) RUE stronger than L; Pt ableto spontaneously reach adn touch the top of his head; gross grasp/release. grip strength @ 3/5; poor in-hand manipulation skills  decreasing his ability to use hand funcitonally. LUE overall weaker than R. elbow  flex 2+/5 ext 3/5; unable to lift L arm off bed due to shoulder weakness; shoulder flex 2/5; poor scpu/humeral rthym. Weaker grasp on L; unable to touch hand to mouth  Exercises  Exercises General Upper Extremity  General Exercises - Upper Extremity  Shoulder Flexion AAROM;AROM;Both;15 reps;Supine  Elbow Flexion AROM;AAROM;Both;20 reps;Supine  Elbow Extension AROM;AAROM;Both;20 reps;Supine  Shoulder ABduction AAROM;AROM;Both;15 reps;Supine  Wrist Flexion AROM;AAROM;Both;15 reps;Supine  Wrist Extension AROM;AAROM;Both;20 reps;Supine  Digit Composite Flexion AROM;AAROM;Both;20 reps;Supine  Composite Extension AROM;AAROM;15 reps;Supine  OT - End of Session  Equipment Utilized During Treatment Oxygen (5L; 28% FiO2)  Activity Tolerance Patient tolerated treatment well  Patient left in chair;with call bell/phone within reach;with chair alarm set;with family/visitor present  Nurse Communication Mobility status;Need for lift equipment  OT Assessment/Plan  OT Plan Discharge plan remains appropriate  OT Visit Diagnosis Unsteadiness on feet (R26.81);Other abnormalities of gait and mobility (R26.89);Muscle weakness (generalized) (M62.81);Other symptoms and signs involving cognitive function;Pain  Pain - part of body  (generalzied)  OT Frequency (ACUTE ONLY) Min 2X/week  Recommendations for Other Services Rehab consult  Follow Up Recommendations SNF  OT Equipment 3 in 1 bedside commode;Wheelchair (measurements OT);Wheelchair cushion (measurements OT);Hospital bed  AM-PAC OT "6 Clicks" Daily Activity Outcome Measure (Version 2)  Help from another person eating meals? 1  Help from another person taking care of personal grooming? 2  Help from another person toileting, which includes using toliet, bedpan, or urinal? 1  Help from another person bathing (including washing, rinsing, drying)? 1  Help from another person to put on and taking off regular upper body clothing? 1  Help from another person to  put on and taking off regular lower body clothing? 1  6 Click Score 7  Progressive Mobility  What is the highest level of mobility based on the progressive mobility assessment? Level 1 (Bedfast) - Unable to balance while sitting on edge of bed  Mobility Sit up in bed/chair position for meals  OT Goal Progression  Progress towards OT goals Progressing toward goals  Acute Rehab OT Goals  Patient Stated Goal none stated by patient  OT Goal Formulation With patient/family  Time For Goal Achievement 02/13/21  Potential to Achieve Goals Good  ADL Goals  Additional ADL Goal #1 pt will follow 2 step comamnds 50% of session  Additional ADL Goal #2 pt will visually track to the R side 50% of session  Additional ADL Goal #3 pt will static sit eob mod (A) for 10 minutes with stable VSS  OT Time Calculation  OT Start Time (ACUTE ONLY) 1204  OT Stop Time (ACUTE ONLY) 1254  OT Time Calculation (min) 50 min  OT General Charges  $OT Visit 1 Visit  OT Treatments  $Neuromuscular Re-education 8-22 mins  Maurie Boettcher, OT/L   Acute OT Clinical Specialist Acute Rehabilitation Services Pager [phone] Office [phone]

## 2021-02-01 NOTE — Progress Notes (Signed)
Informed of water pressure issues in room despite facility maintenance increasing pressure. Is now 850cc positive with just rinsebacks. Would hold off on attempting again since he may end up being another 500cc positive. Will attempt again tomorrow, may need another room if water pressure remains an issue. Discussed with HD RN.  Gean Quint, MD [facility]

## 2021-02-02 DIAGNOSIS — S066X9A Traumatic subarachnoid hemorrhage with loss of consciousness of unspecified duration, initial encounter: Secondary | ICD-10-CM | POA: Diagnosis not present

## 2021-02-02 DIAGNOSIS — I2699 Other pulmonary embolism without acute cor pulmonale: Secondary | ICD-10-CM | POA: Diagnosis not present

## 2021-02-02 DIAGNOSIS — J9601 Acute respiratory failure with hypoxia: Secondary | ICD-10-CM | POA: Diagnosis not present

## 2021-02-02 DIAGNOSIS — I609 Nontraumatic subarachnoid hemorrhage, unspecified: Secondary | ICD-10-CM | POA: Diagnosis not present

## 2021-02-02 DIAGNOSIS — I4891 Unspecified atrial fibrillation: Secondary | ICD-10-CM | POA: Diagnosis not present

## 2021-02-02 DIAGNOSIS — Z20822 Contact with and (suspected) exposure to covid-19: Secondary | ICD-10-CM | POA: Diagnosis not present

## 2021-02-02 LAB — RENAL FUNCTION PANEL
Albumin: 2.3 g/dL — ABNORMAL LOW (ref 3.5–5.0)
Albumin: 2.4 g/dL — ABNORMAL LOW (ref 3.5–5.0)
Anion gap: 20 — ABNORMAL HIGH (ref 5–15)
Anion gap: 13 (ref 5–15)
BUN: 152 mg/dL — ABNORMAL HIGH (ref 8–23)
BUN: 56 mg/dL — ABNORMAL HIGH (ref 8–23)
CO2: 22 mmol/L (ref 22–32)
CO2: 25 mmol/L (ref 22–32)
Calcium: 9.3 mg/dL (ref 8.9–10.3)
Calcium: 8.9 mg/dL (ref 8.9–10.3)
Chloride: 91 mmol/L — ABNORMAL LOW (ref 98–111)
Chloride: 95 mmol/L — ABNORMAL LOW (ref 98–111)
Creatinine, Ser: 3.8 mg/dL — ABNORMAL HIGH (ref 0.61–1.24)
Creatinine, Ser: 1.84 mg/dL — ABNORMAL HIGH (ref 0.61–1.24)
GFR, Estimated: 16 mL/min — ABNORMAL LOW (ref >60)
GFR, Estimated: 39 mL/min — ABNORMAL LOW (ref >60)
Glucose, Bld: 193 mg/dL — ABNORMAL HIGH (ref 70–99)
Glucose, Bld: 162 mg/dL — ABNORMAL HIGH (ref 70–99)
Phosphorus: 8.4 mg/dL — ABNORMAL HIGH (ref 2.5–4.6)
Phosphorus: 3.6 mg/dL (ref 2.5–4.6)
Potassium: 3.8 mmol/L (ref 3.5–5.1)
Potassium: 3.1 mmol/L — ABNORMAL LOW (ref 3.5–5.1)
Sodium: 133 mmol/L — ABNORMAL LOW (ref 135–145)
Sodium: 133 mmol/L — ABNORMAL LOW (ref 135–145)

## 2021-02-02 LAB — CBC
HCT: 22.4 % — ABNORMAL LOW (ref 39.0–52.0)
Hemoglobin: 7 g/dL — ABNORMAL LOW (ref 13.0–17.0)
MCH: 26.2 pg (ref 26.0–34.0)
MCHC: 31.3 g/dL (ref 30.0–36.0)
MCV: 83.9 fL (ref 80.0–100.0)
Platelets: 219 10*3/uL (ref 150–400)
RBC: 2.67 MIL/uL — ABNORMAL LOW (ref 4.22–5.81)
RDW: 18 % — ABNORMAL HIGH (ref 11.5–15.5)
WBC: 7.6 10*3/uL (ref 4.0–10.5)
nRBC: 0 % (ref 0.0–0.2)

## 2021-02-02 LAB — GLUCOSE, CAPILLARY
Glucose-Capillary: 108 mg/dL — ABNORMAL HIGH (ref 70–99)
Glucose-Capillary: 116 mg/dL — ABNORMAL HIGH (ref 70–99)
Glucose-Capillary: 126 mg/dL — ABNORMAL HIGH (ref 70–99)
Glucose-Capillary: 146 mg/dL — ABNORMAL HIGH (ref 70–99)
Glucose-Capillary: 160 mg/dL — ABNORMAL HIGH (ref 70–99)
Glucose-Capillary: 189 mg/dL — ABNORMAL HIGH (ref 70–99)

## 2021-02-02 LAB — MAGNESIUM: Magnesium: 2.9 mg/dL — ABNORMAL HIGH (ref 1.7–2.4)

## 2021-02-02 MED ORDER — HEPARIN SODIUM (PORCINE) 1000 UNIT/ML IJ SOLN
1000.0000 [IU] | Freq: Once | INTRAMUSCULAR | Status: AC
  Administered 2021-02-02: 1000 [IU] via INTRAVENOUS

## 2021-02-02 MED ORDER — METOPROLOL TARTRATE 25 MG/10 ML ORAL SUSPENSION
25.0000 mg | Freq: Two times a day (BID) | ORAL | Status: DC
  Administered 2021-02-02: 25 mg
  Filled 2021-02-02 (×2): qty 10

## 2021-02-02 NOTE — Progress Notes (Signed)
Patient ID: Angel [name], male   DOB: 04-10-1951, 70 y.o.   MRN: [id number] 19 Days Post-Op   Subjective: HTC, no complaint ROS negative except as listed above. Objective: Vital signs in last 24 hours: Temp:  [97.7 F (36.5 C)-99.8 F (37.7 C)] 99 F (37.2 C) (09/17 0400) Pulse Rate:  [99-127] 111 (09/17 0600) Resp:  [11-20] 18 (09/17 0600) BP: (92-150)/(52-85) 103/61 (09/17 0600) SpO2:  [96 %-100 %] 98 % (09/17 0600) FiO2 (%):  [28 %] 28 % (09/17 0423) Weight:  [107.5 kg-108.3 kg] 108.3 kg (09/17 0500) Last BM Date:  (flexiseal)  Intake/Output from previous day: 09/16 0701 - 09/17 0700 In: 1937.5 [I.V.:497.5; NG/GT:1440] Out: -678 [Stool:125] Intake/Output this shift: No intake/output data recorded.  General appearance: cooperative Neck: trach with secretions Resp: clear to auscultation bilaterally Cardio: irregularly irregular rhythm GI: soft, NT Extremities: less edema  Lab Results: CBC  Recent Labs    01/31/21 0520 02/01/21 0445  WBC 10.5 10.8*  HGB 7.2* 7.0*  HCT 23.5* 22.6*  PLT 152 192   BMET Recent Labs    02/01/21 1829 02/02/21 0353  NA 133* 133*  K 4.0 3.8  CL 94* 91*  CO2 23 22  GLUCOSE 122* 193*  BUN 138* 152*  CREATININE 3.64* 3.80*  CALCIUM 9.1 9.3   PT/INR No results for input(s): LABPROT, INR in the last 72 hours. ABG No results for input(s): PHART, HCO3 in the last 72 hours.  Invalid input(s): PCO2, PO2  Studies/Results: No results found.  Anti-infectives: Anti-infectives (From admission, onward)    Start     Dose/Rate Route Frequency Ordered Stop   01/11/21 2330  ceFEPIme (MAXIPIME) 2 g in sodium chloride 0.9 % 100 mL IVPB  Status:  Discontinued        2 g 200 mL/hr over 30 Minutes Intravenous Every 24 hours 01/11/21 0711 01/16/21 0907   01/10/21 1645  ampicillin (OMNIPEN) 2 g in sodium chloride 0.9 % 100 mL IVPB  Status:  Discontinued        2 g 300 mL/hr over 20 Minutes Intravenous Every 8 hours 01/10/21 1549 01/16/21 0907    01/09/21 2200  ceFEPIme (MAXIPIME) 2 g in sodium chloride 0.9 % 100 mL IVPB  Status:  Discontinued        2 g 200 mL/hr over 30 Minutes Intravenous Every 12 hours 01/09/21 1458 01/11/21 0711   01/08/21 1515  metroNIDAZOLE (FLAGYL) IVPB 500 mg  Status:  Discontinued        500 mg 100 mL/hr over 60 Minutes Intravenous Every 8 hours 01/08/21 1428 01/10/21 1618   01/03/21 0600  vancomycin (VANCOREADY) IVPB 1250 mg/250 mL  Status:  Discontinued        1,250 mg 166.7 mL/hr over 90 Minutes Intravenous Every 12 hours 01/02/21 1717 01/03/21 0837   01/02/21 1800  vancomycin (VANCOREADY) IVPB 2000 mg/400 mL        2,000 mg 200 mL/hr over 120 Minutes Intravenous  Once 01/02/21 1712 01/02/21 2007   01/02/21 0900  ceFEPIme (MAXIPIME) 2 g in sodium chloride 0.9 % 100 mL IVPB  Status:  Discontinued        2 g 200 mL/hr over 30 Minutes Intravenous Every 8 hours 01/02/21 0849 01/09/21 1458       Assessment/Plan: Fall down stairs 8/12   VDRF - guaifenisen, wean, S/P trach 8/29 by Dr. Bobbye Morton. Has tolerated HTC several days, passy muir trials ID - off abx, no fevers, completed maxipime/ampicillin 8/31, CT A/P with  ascending colitis and distention. Stool studies and C. dif are all negative. 9/8 Resp cult Pseud - likely colonized.  TBI/SAH/SDH - NSGY c/s, Dr. Annette Stable. Significant frontal lobe injuries. Keppra x7d for sz ppx (completed) Occipital bone fx - NSGY c/s, Dr. Annette Stable Temporal bone fx extending into middle ear - ENT c/s, Dr. Constance Holster Right TM Rupture - ENT c/s, Dr. Constance Holster AFRVR -  amio drip per cardiology ABL anemia - stable Bilateral pulmonary embolism - bivalirudin to DOAC  AKI - HD planned for today (moved rooms due to water issue) Hx DM2 - resistant SSI, novolog q4, glargine 60u BID Hx HTN - PRN meds FEN - NPO, tolerating TF, ST eval VTE - SCDs, Eliquis Dispo - 4NP, HTC, therapies  LOS: 36 days    Georganna Skeans, MD, MPH, FACS Trauma & General Surgery Use [email or web address] to contact on call  provider  02/02/2021

## 2021-02-02 NOTE — Progress Notes (Signed)
[facility] NEPHROLOGY PROGRESS NOTE  Assessment/ Plan:  #Acute kidney injury, oliguric: Multifactorial etiology including ischemic ATN in the setting of hypotension, sepsis complicated by contrast injury. CRRT from 9/1-9/9.  No heparin as he is on bivalirudin.  The HD catheter was changed on 9/7.  CRRT restarted on 9/11 given elevated BUN and with more confusion, did not tolerated IHD on 9/10 (confusion, hypotension). CRRT clotted off on 9/13. Did relatively okay with HD 9/14-9/15 overnight -no signs of renal recovery -HD today, has changed rooms now so hopefully water pressure will not be an issue. Tentatively planning for his next HD treatment on Monday, will attempt to maintain him on a MWF schedule -Will likely need his catheter to be tunneled as there are no signs of renal recovery as of yet, nonurgent, can consult IR early in the week -Continue to monitor strict ins and out, daily labs.  #Fall/bilateral subarachnoid hemorrhage/SDH, TBI/occipital and temporal bone fracture: Per trauma team.  Repeat CT scan with no acute finding.  #Acute respiratory failure: Status post trach on 8/29, trach collar  #A. fib with RVR: On amiodarone, Angiomax.  # Anemia of critical illness: Transfuse as needed.  #Metabolic acidosis: Managed with dialysis. resolved  #Bilateral pulm embolism: Currently on anticoagulation.  #Acute febrile illness: Per primary team.  #Hyperkalemia:  K wnl now, managing with HD  Discussed with RN.  Subjective: Seen and examined in ICU. Unable to run on HD yesterday given water pressure issues in the room despite troubleshooting with the assistance of facility maintenance. Plan for HD today. Very little urine output per RN. Objective Vital signs in last 24 hours: Vitals:   02/02/21 0746 02/02/21 0800 02/02/21 0900 02/02/21 1000  BP: 112/62 121/65 125/70 112/66  Pulse: 100 (!) 124 (!) 116 (!) 113  Resp: 17 19 14 14   Temp:  98.8 F (37.1 C)    TempSrc:   Oral    SpO2: 98% 95% 93% 94%  Weight:      Height:       Weight change: 0 kg  Intake/Output Summary (Last 24 hours) at 02/02/2021 1111 Last data filed at 02/02/2021 1000 Gross per 24 hour  Intake 2081.63 ml  Output -735 ml  Net 2816.63 ml       Labs: Basic Metabolic Panel: Recent Labs  Lab 02/01/21 0445 02/01/21 1829 02/02/21 0353  NA 133* 133* 133*  K 4.3 4.0 3.8  CL 93* 94* 91*  CO2 22 23 22   GLUCOSE 172* 122* 193*  BUN 134* 138* 152*  CREATININE 3.74* 3.64* 3.80*  CALCIUM 10.0 9.1 9.3  PHOS 7.6* 7.8* 8.4*   Liver Function Tests: Recent Labs  Lab 02/01/21 0445 02/01/21 1829 02/02/21 0353  ALBUMIN 2.5* 2.4* 2.3*   No results for input(s): LIPASE, AMYLASE in the last 168 hours. No results for input(s): AMMONIA in the last 168 hours. CBC: Recent Labs  Lab 01/28/21 0319 01/29/21 0317 01/30/21 0521 01/31/21 0520 02/01/21 0445  WBC 11.7* 11.4* 14.0* 10.5 10.8*  HGB 7.8* 8.2* 8.2* 7.2* 7.0*  HCT 24.9* 26.4* 26.5* 23.5* 22.6*  MCV 85.9 84.6 86.0 85.5 85.9  PLT 164 157 172 152 192   Cardiac Enzymes: No results for input(s): CKTOTAL, CKMB, CKMBINDEX, TROPONINI in the last 168 hours. CBG: Recent Labs  Lab 02/01/21 1540 02/01/21 1921 02/01/21 2322 02/02/21 0318 02/02/21 0744  GLUCAP 111* 117* 131* 146* 116*    Iron Studies: No results for input(s): IRON, TIBC, TRANSFERRIN, FERRITIN in the last 72 hours. Studies/Results:  No results found.  Medications: Infusions:  sodium chloride     sodium chloride     sodium chloride     albumin human 25 g (01/30/21 2058)   albumin human     amiodarone 30 mg/hr (02/02/21 1000)   feeding supplement (NEPRO CARB STEADY) 60 mL/hr at 02/02/21 0600    Scheduled Medications:  acetaminophen  1,000 mg Per Tube Q6H   apixaban  5 mg Per Tube BID   chlorhexidine gluconate (MEDLINE KIT)  15 mL Mouth Rinse BID   Chlorhexidine Gluconate Cloth  6 each Topical Q0600   docusate  100 mg Per Tube BID   feeding  supplement (PROSource TF)  45 mL Per Tube BID   guaiFENesin  10 mL Per Tube Q4H   hydrALAZINE  25 mg Per Tube Q8H   insulin aspart  0-20 Units Subcutaneous Q4H   insulin aspart  10 Units Subcutaneous Q4H   insulin glargine-yfgn  60 Units Subcutaneous BID   mouth rinse  15 mL Mouth Rinse 10 times per day   methocarbamol  1,000 mg Per Tube Q8H   pantoprazole sodium  40 mg Per Tube Daily   polyethylene glycol  17 g Per Tube Daily   QUEtiapine  50 mg Per Tube QHS   senna  1 tablet Per Tube Daily   sodium chloride flush  10-40 mL Intracatheter Q12H    have reviewed scheduled and prn medications.  Physical Exam: General: nad, tracheostomy Heart: rrr Lungs: trach, cta bl Abdomen:soft, nontender. Extremities: trace dependent edema. Neurology:Alert awake  Dialysis Access: Left subclavian temporary HD catheter placed on 9/7.  Markie Heffernan 02/02/2021,11:11 AM  LOS: 36 days

## 2021-02-02 NOTE — Progress Notes (Signed)
Progress Note  Patient Name: Angel [name] Date of Encounter: 02/02/2021  Primary Cardiologist:   Werner Lean, MD   Subjective   Remains in persistent afib with RVR on IV amiodarone. Hypotension has limited use of BB or CCB, however, BP improved somewhat today. Plans for dialysis today.  Inpatient Medications    Scheduled Meds:  acetaminophen  1,000 mg Per Tube Q6H   apixaban  5 mg Per Tube BID   chlorhexidine gluconate (MEDLINE KIT)  15 mL Mouth Rinse BID   Chlorhexidine Gluconate Cloth  6 each Topical Q0600   docusate  100 mg Per Tube BID   feeding supplement (PROSource TF)  45 mL Per Tube BID   guaiFENesin  10 mL Per Tube Q4H   hydrALAZINE  25 mg Per Tube Q8H   insulin aspart  0-20 Units Subcutaneous Q4H   insulin aspart  10 Units Subcutaneous Q4H   insulin glargine-yfgn  60 Units Subcutaneous BID   mouth rinse  15 mL Mouth Rinse 10 times per day   methocarbamol  1,000 mg Per Tube Q8H   pantoprazole sodium  40 mg Per Tube Daily   polyethylene glycol  17 g Per Tube Daily   QUEtiapine  50 mg Per Tube QHS   senna  1 tablet Per Tube Daily   sodium chloride flush  10-40 mL Intracatheter Q12H   Continuous Infusions:  sodium chloride     sodium chloride     sodium chloride     albumin human 25 g (01/30/21 2058)   albumin human     amiodarone 30 mg/hr (02/02/21 1000)   feeding supplement (NEPRO CARB STEADY) 60 mL/hr at 02/02/21 0600   PRN Meds: Place/Maintain arterial line **AND** sodium chloride, sodium chloride, sodium chloride, albumin human, albumin human, alteplase, artificial tears, heparin, hydrALAZINE, HYDROmorphone (DILAUDID) injection, lidocaine (PF), lidocaine-prilocaine, midazolam, ondansetron **OR** ondansetron (ZOFRAN) IV, oxyCODONE, pentafluoroprop-tetrafluoroeth, sodium chloride flush   Vital Signs    Vitals:   02/02/21 0800 02/02/21 0900 02/02/21 1000 02/02/21 1200  BP: 121/65 125/70 112/66 121/77  Pulse: (!) 124 (!) 116 (!) 113 (!) 109   Resp: _0 Temp: 98.8 F (37.1 C)   98.1 F (36.7 C)  TempSrc: Oral   Axillary  SpO2: 95% 93% 94% 95%  Weight:      Height:        Intake/Output Summary (Last 24 hours) at 02/02/2021 1233 Last data filed at 02/02/2021 1000 Gross per 24 hour  Intake 2081.63 ml  Output -735 ml  Net 2816.63 ml   Filed Weights   02/01/21 0455 02/01/21 1642 02/02/21 0500  Weight: 107.5 kg 107.5 kg 108.3 kg    Telemetry   Atrial fibrillation with RVR, rates in the 110-130's- Personally Reviewed  ECG    Afib with RVR at 115, IVCD- Personally Reviewed  Physical Exam   GEN: ill appearing, trached HEENT: Normal NECK: difficult to assess JVD due to body habitus.  Trach present LYMPHATICS: No lymphadenopathy CARDIAC:irregularly irregular and tachy, no murmurs, rubs, gallops RESPIRATORY:  Clear to auscultation without rales, wheezing or rhonchi  ABDOMEN: Soft, non-tender, non-distended MUSCULOSKELETAL:  No edema; No deformity  SKIN: Warm and dry NEUROLOGIC:  cannot assess PSYCHIATRIC: cannot assess  Labs    Chemistry Recent Labs  Lab 02/01/21 0445 02/01/21 1829 02/02/21 0353  NA 133* 133* 133*  K 4.3 4.0 3.8  CL 93* 94* 91*  CO2 _1 GLUCOSE 172* 122* 193*  BUN 134*  138* 152*  CREATININE 3.74* 3.64* 3.80*  CALCIUM 10.0 9.1 9.3  ALBUMIN 2.5* 2.4* 2.3*  GFRNONAA 17* 17* 16*  ANIONGAP 18* 16* 20*     Hematology Recent Labs  Lab 01/30/21 0521 01/31/21 0520 02/01/21 0445  WBC 14.0* 10.5 10.8*  RBC 3.08* 2.75* 2.63*  HGB 8.2* 7.2* 7.0*  HCT 26.5* 23.5* 22.6*  MCV 86.0 85.5 85.9  MCH 26.6 26.2 26.6  MCHC 30.9 30.6 31.0  RDW 17.5* 17.9* 18.0*  PLT 172 152 192    Cardiac EnzymesNo results for input(s): TROPONINI in the last 168 hours. No results for input(s): TROPIPOC in the last 168 hours.   BNPNo results for input(s): BNP, PROBNP in the last 168 hours.   DDimer No results for input(s): DDIMER in the last 168 hours.   Radiology    No results  found.  Cardiac Studies   Echo 01/07/21: 1. Left ventricular ejection fraction, by estimation, is 60 to 65%. The  left ventricle has normal function. The left ventricle has no regional  wall motion abnormalities. There is mild left ventricular hypertrophy.  Left ventricular diastolic parameters  are indeterminate.   2. Right ventricule is poorly visualized but grossly normal size and  systolic function   3. Left atrial size was mildly dilated.   4. Right atrial size was mildly dilated.   5. The mitral valve is normal in structure. No evidence of mitral valve  regurgitation. No evidence of mitral stenosis.   6. The aortic valve was not well visualized. Aortic valve regurgitation  is not visualized. No aortic stenosis is present.   Patient Profile     70 y.o. male with a PMH of hyperlipidemia, DM type II, who presented with fall resulting in subarachnoid hemorrhage requiring intubation with hospital course complicated by Multi lobar pneumonia, PE, sepsis, AKI requiring CRRT, and new onset atrial fibrillation/flutter for which cardiology is following.  Assessment & Plan    New onset paroxysmal atrial flutter:    -Atrial fib with rapid rate in the 130's >>he converted to NSR on Amio but now back in afib with RVR -On angiomax.     -Transition to PO anticoag when OK with primary team and no further invasive procedures planned.    -continue with IV Amio gtts -Will add scheduled lopressor per tube to aid in rate control, monitor BP with this, d/c hydralazine.  Hypoxic respiratory failure:  -Hospital course complicated by multilobar PNA and bilateral PE -Continue vent management per primary team.   AKI:   -Unable to tolerate intermittent HD.   -Now on CRRT.    HTN -BP controlled at 116/3mHg -d/c hydralazine to allow BP room for metoprolol -start metoprolol 25 mg BID per tube   For questions or updates, please contact CCanadohta LakePlease consult [email or web address] for contact info  under Cardiology/STEMI.  KPixie Casino MD, FVillages [facility] LLC FSereno del MarDirector of the Advanced Lipid Disorders &  Cardiovascular Risk Reduction Clinic Diplomate of the American Board of Clinical Lipidology Attending Cardiologist  Direct Dial: [phone] Fax: [phone] Website:  [email or web address]  KPixie Casino MD  02/02/2021, 12:33 PM

## 2021-02-03 DIAGNOSIS — S066X9A Traumatic subarachnoid hemorrhage with loss of consciousness of unspecified duration, initial encounter: Secondary | ICD-10-CM | POA: Diagnosis not present

## 2021-02-03 DIAGNOSIS — I4891 Unspecified atrial fibrillation: Secondary | ICD-10-CM | POA: Diagnosis not present

## 2021-02-03 DIAGNOSIS — I2699 Other pulmonary embolism without acute cor pulmonale: Secondary | ICD-10-CM | POA: Diagnosis not present

## 2021-02-03 DIAGNOSIS — Z20822 Contact with and (suspected) exposure to covid-19: Secondary | ICD-10-CM | POA: Diagnosis not present

## 2021-02-03 DIAGNOSIS — I609 Nontraumatic subarachnoid hemorrhage, unspecified: Secondary | ICD-10-CM | POA: Diagnosis not present

## 2021-02-03 DIAGNOSIS — J9601 Acute respiratory failure with hypoxia: Secondary | ICD-10-CM | POA: Diagnosis not present

## 2021-02-03 LAB — RENAL FUNCTION PANEL
Albumin: 2.3 g/dL — ABNORMAL LOW (ref 3.5–5.0)
Albumin: 2.3 g/dL — ABNORMAL LOW (ref 3.5–5.0)
Anion gap: 16 — ABNORMAL HIGH (ref 5–15)
Anion gap: 16 — ABNORMAL HIGH (ref 5–15)
BUN: 72 mg/dL — ABNORMAL HIGH (ref 8–23)
BUN: 97 mg/dL — ABNORMAL HIGH (ref 8–23)
CO2: 25 mmol/L (ref 22–32)
CO2: 24 mmol/L (ref 22–32)
Calcium: 9.1 mg/dL (ref 8.9–10.3)
Calcium: 9.1 mg/dL (ref 8.9–10.3)
Chloride: 93 mmol/L — ABNORMAL LOW (ref 98–111)
Chloride: 94 mmol/L — ABNORMAL LOW (ref 98–111)
Creatinine, Ser: 2.51 mg/dL — ABNORMAL HIGH (ref 0.61–1.24)
Creatinine, Ser: 2.8 mg/dL — ABNORMAL HIGH (ref 0.61–1.24)
GFR, Estimated: 27 mL/min — ABNORMAL LOW (ref >60)
GFR, Estimated: 24 mL/min — ABNORMAL LOW (ref >60)
Glucose, Bld: 149 mg/dL — ABNORMAL HIGH (ref 70–99)
Glucose, Bld: 103 mg/dL — ABNORMAL HIGH (ref 70–99)
Phosphorus: 4.8 mg/dL — ABNORMAL HIGH (ref 2.5–4.6)
Phosphorus: 5.8 mg/dL — ABNORMAL HIGH (ref 2.5–4.6)
Potassium: 3.2 mmol/L — ABNORMAL LOW (ref 3.5–5.1)
Potassium: 3.2 mmol/L — ABNORMAL LOW (ref 3.5–5.1)
Sodium: 134 mmol/L — ABNORMAL LOW (ref 135–145)
Sodium: 134 mmol/L — ABNORMAL LOW (ref 135–145)

## 2021-02-03 LAB — GLUCOSE, CAPILLARY
Glucose-Capillary: 146 mg/dL — ABNORMAL HIGH (ref 70–99)
Glucose-Capillary: 103 mg/dL — ABNORMAL HIGH (ref 70–99)
Glucose-Capillary: 171 mg/dL — ABNORMAL HIGH (ref 70–99)
Glucose-Capillary: 108 mg/dL — ABNORMAL HIGH (ref 70–99)
Glucose-Capillary: 124 mg/dL — ABNORMAL HIGH (ref 70–99)
Glucose-Capillary: 123 mg/dL — ABNORMAL HIGH (ref 70–99)

## 2021-02-03 LAB — MAGNESIUM: Magnesium: 2.4 mg/dL (ref 1.7–2.4)

## 2021-02-03 MED ORDER — ORAL CARE MOUTH RINSE
15.0000 mL | Freq: Two times a day (BID) | OROMUCOSAL | Status: DC
  Administered 2021-02-03 – 2021-02-28 (×38): 15 mL via OROMUCOSAL

## 2021-02-03 MED ORDER — CHLORHEXIDINE GLUCONATE 0.12 % MT SOLN
15.0000 mL | Freq: Two times a day (BID) | OROMUCOSAL | Status: DC
  Administered 2021-02-03 – 2021-03-01 (×51): 15 mL via OROMUCOSAL
  Filled 2021-02-03 (×47): qty 15

## 2021-02-03 MED ORDER — METOPROLOL TARTRATE 25 MG/10 ML ORAL SUSPENSION
25.0000 mg | Freq: Three times a day (TID) | ORAL | Status: DC
  Administered 2021-02-03 – 2021-02-05 (×4): 25 mg
  Filled 2021-02-03 (×6): qty 10

## 2021-02-03 NOTE — Progress Notes (Signed)
[facility] NEPHROLOGY PROGRESS NOTE  Assessment/ Plan:  #Acute kidney injury, oliguric: Multifactorial etiology including ischemic ATN in the setting of hypotension, sepsis complicated by contrast injury. CRRT from 9/1-9/9.  No heparin as he is on bivalirudin.  The HD catheter was changed on 9/7.  CRRT restarted on 9/11 given elevated BUN and with more confusion, did not tolerated IHD on 9/10 (confusion, hypotension). CRRT clotted off on 9/13. Did relatively okay with HD 9/14-9/15 overnight -no signs of renal recovery yet -HD tomorrow. will attempt to maintain him on a MWF schedule -Will likely need his catheter to be tunneled as there are no signs of renal recovery as of yet, nonurgent, can consult IR early in the week -Continue to monitor strict ins and out, daily labs.  #Fall/bilateral subarachnoid hemorrhage/SDH, TBI/occipital and temporal bone fracture: Per trauma team.  Repeat CT scan with no acute finding.  #Acute respiratory failure: Status post trach on 8/29, trach collar  #A. fib with RVR: On amiodarone, Angiomax.  # Anemia of critical illness: Transfuse as needed.  #Metabolic acidosis: Managed with dialysis. resolved  #Bilateral pulm embolism: Currently on anticoagulation.  #Acute febrile illness: Per primary team.  #Hyperkalemia:  K wnl now, managing with HD, now hypokalemic--will attempt 3K bath tomorrow  Discussed with primary service.  Subjective: Seen and examined in ICU. Tolerated HD yesterday, net UF 1650cc. Objective Vital signs in last 24 hours: Vitals:   02/03/21 0416 02/03/21 0500 02/03/21 0800 02/03/21 0811  BP: 119/69 123/63 (!) 108/49 (!) 108/49  Pulse: (!) 110 (!) 107 (!) 103 (!) 106  Resp: 16 19 16 17   Temp:    99.2 F (37.3 C)  TempSrc:    Oral  SpO2: 95% 96% 98% 98%  Weight:  107.5 kg    Height:       Weight change: 0.8 kg  Intake/Output Summary (Last 24 hours) at 02/03/2021 0941 Last data filed at 02/03/2021 0800 Gross per 24  hour  Intake 1761.28 ml  Output 1650 ml  Net 111.28 ml       Labs: Basic Metabolic Panel: Recent Labs  Lab 02/02/21 0353 02/02/21 2147 02/03/21 0339  NA 133* 133* 134*  K 3.8 3.1* 3.2*  CL 91* 95* 93*  CO2 22 25 25   GLUCOSE 193* 162* 149*  BUN 152* 56* 72*  CREATININE 3.80* 1.84* 2.51*  CALCIUM 9.3 8.9 9.1  PHOS 8.4* 3.6 4.8*   Liver Function Tests: Recent Labs  Lab 02/02/21 0353 02/02/21 2147 02/03/21 0339  ALBUMIN 2.3* 2.4* 2.3*   No results for input(s): LIPASE, AMYLASE in the last 168 hours. No results for input(s): AMMONIA in the last 168 hours. CBC: Recent Labs  Lab 01/29/21 0317 01/30/21 0521 01/31/21 0520 02/01/21 0445 02/02/21 2147  WBC 11.4* 14.0* 10.5 10.8* 7.6  HGB 8.2* 8.2* 7.2* 7.0* 7.0*  HCT 26.4* 26.5* 23.5* 22.6* 22.4*  MCV 84.6 86.0 85.5 85.9 83.9  PLT 157 172 152 192 219   Cardiac Enzymes: No results for input(s): CKTOTAL, CKMB, CKMBINDEX, TROPONINI in the last 168 hours. CBG: Recent Labs  Lab 02/02/21 1524 02/02/21 1937 02/02/21 2333 02/03/21 0327 02/03/21 0730  GLUCAP 126* 108* 189* 146* 103*    Iron Studies: No results for input(s): IRON, TIBC, TRANSFERRIN, FERRITIN in the last 72 hours. Studies/Results: No results found.  Medications: Infusions:  sodium chloride     sodium chloride     sodium chloride     albumin human 25 g (01/30/21 2058)   albumin human  amiodarone 30 mg/hr (02/03/21 0800)   feeding supplement (NEPRO CARB STEADY) 1,000 mL (02/02/21 1711)    Scheduled Medications:  acetaminophen  1,000 mg Per Tube Q6H   apixaban  5 mg Per Tube BID   chlorhexidine  15 mL Mouth Rinse BID   Chlorhexidine Gluconate Cloth  6 each Topical Q0600   docusate  100 mg Per Tube BID   feeding supplement (PROSource TF)  45 mL Per Tube BID   guaiFENesin  10 mL Per Tube Q4H   insulin aspart  0-20 Units Subcutaneous Q4H   insulin aspart  10 Units Subcutaneous Q4H   insulin glargine-yfgn  60 Units Subcutaneous BID    mouth rinse  15 mL Mouth Rinse q12n4p   methocarbamol  1,000 mg Per Tube Q8H   metoprolol tartrate  25 mg Per Tube BID   pantoprazole sodium  40 mg Per Tube Daily   polyethylene glycol  17 g Per Tube Daily   QUEtiapine  50 mg Per Tube QHS   senna  1 tablet Per Tube Daily   sodium chloride flush  10-40 mL Intracatheter Q12H    have reviewed scheduled and prn medications.  Physical Exam: General: nad, tracheostomy Heart: rrr Lungs: trach, cta bl Abdomen:soft, nontender. Extremities: trace dependent edema. Neurology:Alert awake  Dialysis Access: Left subclavian temporary HD catheter placed on 9/7.  Brigetta Beckstrom 02/03/2021,9:41 AM  LOS: 37 days

## 2021-02-03 NOTE — Progress Notes (Signed)
Progress Note  Patient Name: Angel [name] Date of Encounter: 02/03/2021  Primary Cardiologist:   Werner Lean, MD   Subjective   No issues overnight - afib remains uncontrolled. BP normal to low normal. Off hydralazine - remains on IV amiodarone and added metoprolol yesterday.  Inpatient Medications    Scheduled Meds:  acetaminophen  1,000 mg Per Tube Q6H   apixaban  5 mg Per Tube BID   chlorhexidine  15 mL Mouth Rinse BID   Chlorhexidine Gluconate Cloth  6 each Topical Q0600   docusate  100 mg Per Tube BID   feeding supplement (PROSource TF)  45 mL Per Tube BID   guaiFENesin  10 mL Per Tube Q4H   insulin aspart  0-20 Units Subcutaneous Q4H   insulin aspart  10 Units Subcutaneous Q4H   insulin glargine-yfgn  60 Units Subcutaneous BID   mouth rinse  15 mL Mouth Rinse q12n4p   methocarbamol  1,000 mg Per Tube Q8H   metoprolol tartrate  25 mg Per Tube BID   pantoprazole sodium  40 mg Per Tube Daily   polyethylene glycol  17 g Per Tube Daily   QUEtiapine  50 mg Per Tube QHS   senna  1 tablet Per Tube Daily   sodium chloride flush  10-40 mL Intracatheter Q12H   Continuous Infusions:  sodium chloride     sodium chloride     sodium chloride     albumin human 25 g (01/30/21 2058)   albumin human     amiodarone 30 mg/hr (02/03/21 1100)   feeding supplement (NEPRO CARB STEADY) 1,000 mL (02/02/21 1711)   PRN Meds: Place/Maintain arterial line **AND** sodium chloride, sodium chloride, sodium chloride, albumin human, albumin human, alteplase, artificial tears, heparin, hydrALAZINE, HYDROmorphone (DILAUDID) injection, lidocaine (PF), lidocaine-prilocaine, midazolam, ondansetron **OR** ondansetron (ZOFRAN) IV, oxyCODONE, pentafluoroprop-tetrafluoroeth, sodium chloride flush   Vital Signs    Vitals:   02/03/21 1000 02/03/21 1030 02/03/21 1100 02/03/21 1120  BP: 111/62 115/69 112/63   Pulse: (!) 115 (!) 114 (!) 111 (!) 101  Resp: 16 12 17 16   Temp:    99.1 F (37.3 C)   TempSrc:    Oral  SpO2: 95% 96% (!) 85% 99%  Weight:      Height:        Intake/Output Summary (Last 24 hours) at 02/03/2021 1157 Last data filed at 02/03/2021 1100 Gross per 24 hour  Intake 1837.97 ml  Output 1650 ml  Net 187.97 ml   Filed Weights   02/02/21 1500 02/02/21 1910 02/03/21 0500  Weight: 108.3 kg 106.6 kg 107.5 kg    Telemetry   Atrial fibrillation with RVR, rates in the 110-130's- Personally Reviewed  ECG    Afib with RVR at 115, IVCD- Personally Reviewed  Physical Exam   GEN: ill appearing, trached HEENT: Normal NECK: difficult to assess JVD due to body habitus.  Trach present LYMPHATICS: No lymphadenopathy CARDIAC:irregularly irregular and tachy, no murmurs, rubs, gallops RESPIRATORY:  Clear to auscultation without rales, wheezing or rhonchi  ABDOMEN: Soft, non-tender, non-distended MUSCULOSKELETAL:  No edema; No deformity  SKIN: Warm and dry NEUROLOGIC:  cannot assess PSYCHIATRIC: cannot assess  Labs    Chemistry Recent Labs  Lab 02/02/21 0353 02/02/21 2147 02/03/21 0339  NA 133* 133* 134*  K 3.8 3.1* 3.2*  CL 91* 95* 93*  CO2 22 25 25   GLUCOSE 193* 162* 149*  BUN 152* 56* 72*  CREATININE 3.80* 1.84* 2.51*  CALCIUM 9.3 8.9  9.1  ALBUMIN 2.3* 2.4* 2.3*  GFRNONAA 16* 39* 27*  ANIONGAP 20* 13 16*     Hematology Recent Labs  Lab 01/31/21 0520 02/01/21 0445 02/02/21 2147  WBC 10.5 10.8* 7.6  RBC 2.75* 2.63* 2.67*  HGB 7.2* 7.0* 7.0*  HCT 23.5* 22.6* 22.4*  MCV 85.5 85.9 83.9  MCH 26.2 26.6 26.2  MCHC 30.6 31.0 31.3  RDW 17.9* 18.0* 18.0*  PLT 152 192 219    Cardiac EnzymesNo results for input(s): TROPONINI in the last 168 hours. No results for input(s): TROPIPOC in the last 168 hours.   BNPNo results for input(s): BNP, PROBNP in the last 168 hours.   DDimer No results for input(s): DDIMER in the last 168 hours.   Radiology    No results found.  Cardiac Studies   Echo 01/07/21: 1. Left ventricular ejection fraction,  by estimation, is 60 to 65%. The  left ventricle has normal function. The left ventricle has no regional  wall motion abnormalities. There is mild left ventricular hypertrophy.  Left ventricular diastolic parameters  are indeterminate.   2. Right ventricule is poorly visualized but grossly normal size and  systolic function   3. Left atrial size was mildly dilated.   4. Right atrial size was mildly dilated.   5. The mitral valve is normal in structure. No evidence of mitral valve  regurgitation. No evidence of mitral stenosis.   6. The aortic valve was not well visualized. Aortic valve regurgitation  is not visualized. No aortic stenosis is present.   Patient Profile     70 y.o. male with a PMH of hyperlipidemia, DM type II, who presented with fall resulting in subarachnoid hemorrhage requiring intubation with hospital course complicated by Multi lobar pneumonia, PE, sepsis, AKI requiring CRRT, and new onset atrial fibrillation/flutter for which cardiology is following.  Assessment & Plan    New onset paroxysmal atrial flutter:    -Atrial fib with rapid rate in the 130's >>he converted to NSR on Amio but now back in afib with RVR -On angiomax.     -Transition to PO anticoag when OK with primary team and no further invasive procedures planned.    -continue with IV Amio gtts -bp a little lower, but not hypotensive per se, HR remains elevated after adding metoprolol 25 mg BID - will increase to TID - monitor BP, but should tolerate small increase, however, rate control has been challenging  Hypoxic respiratory failure:  -Hospital course complicated by multilobar PNA and bilateral PE -Continue vent management per primary team.   AKI:   -Unable to tolerate intermittent HD.   -Was on CRRT, now HD due to issues with CRRT  HTN -BP normal to low normal -increase metoprolol to 25 mg TID per tube   For questions or updates, please contact [facility] Please consult [email or web address] for  contact info under Cardiology/STEMI.  Pixie Casino, MD, [facility], [place] Director of the Advanced Lipid Disorders &  Cardiovascular Risk Reduction Clinic Diplomate of the American Board of Clinical Lipidology Attending Cardiologist  Direct Dial: [phone]  Fax: [phone]  Website:  [email or web address]  Pixie Casino, MD  02/03/2021, 11:57 AM

## 2021-02-03 NOTE — Progress Notes (Addendum)
Patient ID: Angel [name], male   DOB: August 06, 1950, 70 y.o.   MRN: [id number] 20 Days Post-Op   Subjective: Tolerated HD BP a little lower ROS negative except as listed above. Objective: Vital signs in last 24 hours: Temp:  [98.1 F (36.7 C)-98.9 F (37.2 C)] 98.3 F (36.8 C) (09/18 0331) Pulse Rate:  [101-143] 107 (09/18 0500) Resp:  [13-25] 19 (09/18 0500) BP: (90-128)/(51-80) 123/63 (09/18 0500) SpO2:  [93 %-99 %] 96 % (09/18 0500) FiO2 (%):  [21 %] 21 % (09/18 0416) Weight:  [106.6 kg-108.3 kg] 107.5 kg (09/18 0500) Last BM Date: 02/03/21  Intake/Output from previous day: 09/17 0701 - 09/18 0700 In: 1101.4 [I.V.:381.4; NG/GT:720] Out: 1650  Intake/Output this shift: No intake/output data recorded.  General appearance: cooperative Resp: clear to auscultation bilaterally Cardio: irregularly irregular rhythm GI: soft, non-tender; bowel sounds normal; no masses,  no organomegaly Extremities: less edema Neurologic: Mental status: F/C  Lab Results: CBC  Recent Labs    02/01/21 0445 02/02/21 2147  WBC 10.8* 7.6  HGB 7.0* 7.0*  HCT 22.6* 22.4*  PLT 192 219   BMET Recent Labs    02/02/21 2147 02/03/21 0339  NA 133* 134*  K 3.1* 3.2*  CL 95* 93*  CO2 25 25  GLUCOSE 162* 149*  BUN 56* 72*  CREATININE 1.84* 2.51*  CALCIUM 8.9 9.1   PT/INR No results for input(s): LABPROT, INR in the last 72 hours. ABG No results for input(s): PHART, HCO3 in the last 72 hours.  Invalid input(s): PCO2, PO2  Studies/Results: No results found.  Anti-infectives: Anti-infectives (From admission, onward)    Start     Dose/Rate Route Frequency Ordered Stop   01/11/21 2330  ceFEPIme (MAXIPIME) 2 g in sodium chloride 0.9 % 100 mL IVPB  Status:  Discontinued        2 g 200 mL/hr over 30 Minutes Intravenous Every 24 hours 01/11/21 0711 01/16/21 0907   01/10/21 1645  ampicillin (OMNIPEN) 2 g in sodium chloride 0.9 % 100 mL IVPB  Status:  Discontinued        2 g 300 mL/hr over 20  Minutes Intravenous Every 8 hours 01/10/21 1549 01/16/21 0907   01/09/21 2200  ceFEPIme (MAXIPIME) 2 g in sodium chloride 0.9 % 100 mL IVPB  Status:  Discontinued        2 g 200 mL/hr over 30 Minutes Intravenous Every 12 hours 01/09/21 1458 01/11/21 0711   01/08/21 1515  metroNIDAZOLE (FLAGYL) IVPB 500 mg  Status:  Discontinued        500 mg 100 mL/hr over 60 Minutes Intravenous Every 8 hours 01/08/21 1428 01/10/21 1618   01/03/21 0600  vancomycin (VANCOREADY) IVPB 1250 mg/250 mL  Status:  Discontinued        1,250 mg 166.7 mL/hr over 90 Minutes Intravenous Every 12 hours 01/02/21 1717 01/03/21 0837   01/02/21 1800  vancomycin (VANCOREADY) IVPB 2000 mg/400 mL        2,000 mg 200 mL/hr over 120 Minutes Intravenous  Once 01/02/21 1712 01/02/21 2007   01/02/21 0900  ceFEPIme (MAXIPIME) 2 g in sodium chloride 0.9 % 100 mL IVPB  Status:  Discontinued        2 g 200 mL/hr over 30 Minutes Intravenous Every 8 hours 01/02/21 0849 01/09/21 1458       Assessment/Plan: Fall down stairs 8/12   VDRF - guaifenisen, wean, S/P trach 8/29 by Dr. Bobbye Morton. Has tolerated HTC well, passy muir trials ID - off  abx, no fevers, completed maxipime/ampicillin 8/31, CT A/P with ascending colitis and distention. Stool studies and C. dif are all negative. 9/8 Resp cult Pseud - likely colonized.  TBI/SAH/SDH - NSGY c/s, Dr. Annette Stable. Significant frontal lobe injuries. Keppra x7d for sz ppx (completed) Occipital bone fx - NSGY c/s, Dr. Annette Stable Temporal bone fx extending into middle ear - ENT c/s, Dr. Constance Holster Right TM Rupture - ENT c/s, Dr. Constance Holster AFRVR -  amio drip per Cardiology, lopressor added per Cardiology 9/17, BP somewhat lower ABL anemia  Bilateral pulmonary embolism - Eliquis AKI - HD per Renal, will need tunneled HD cath by IR this week Hx DM2 - resistant SSI, novolog q4, glargine 60u BID Hx HTN - PRN meds FEN - NPO, tolerating TF, ST eval, defer mild hypokalemia to Renal VTE - SCDs, Eliquis Dispo - 4NP, HTC,  therapies   LOS: 21 days    Georganna Skeans, MD, MPH, FACS Trauma & General Surgery Use [email or web address] to contact on call provider  02/03/2021

## 2021-02-04 ENCOUNTER — Inpatient Hospital Stay (HOSPITAL_COMMUNITY): Payer: PPO

## 2021-02-04 DIAGNOSIS — I4891 Unspecified atrial fibrillation: Secondary | ICD-10-CM | POA: Diagnosis not present

## 2021-02-04 DIAGNOSIS — Z20822 Contact with and (suspected) exposure to covid-19: Secondary | ICD-10-CM | POA: Diagnosis not present

## 2021-02-04 DIAGNOSIS — I2699 Other pulmonary embolism without acute cor pulmonale: Secondary | ICD-10-CM | POA: Diagnosis not present

## 2021-02-04 DIAGNOSIS — J9601 Acute respiratory failure with hypoxia: Secondary | ICD-10-CM | POA: Diagnosis not present

## 2021-02-04 DIAGNOSIS — S066X9A Traumatic subarachnoid hemorrhage with loss of consciousness of unspecified duration, initial encounter: Secondary | ICD-10-CM | POA: Diagnosis not present

## 2021-02-04 DIAGNOSIS — N179 Acute kidney failure, unspecified: Secondary | ICD-10-CM | POA: Diagnosis not present

## 2021-02-04 HISTORY — PX: IR FLUORO GUIDE CV LINE RIGHT: IMG2283

## 2021-02-04 HISTORY — PX: IR US GUIDE VASC ACCESS RIGHT: IMG2390

## 2021-02-04 HISTORY — PX: IR PERC TUN PERIT CATH WO PORT S&I /IMAG: IMG2327

## 2021-02-04 LAB — RENAL FUNCTION PANEL
Albumin: 2.2 g/dL — ABNORMAL LOW (ref 3.5–5.0)
Albumin: 2.3 g/dL — ABNORMAL LOW (ref 3.5–5.0)
Anion gap: 19 — ABNORMAL HIGH (ref 5–15)
Anion gap: 15 (ref 5–15)
BUN: 115 mg/dL — ABNORMAL HIGH (ref 8–23)
BUN: 102 mg/dL — ABNORMAL HIGH (ref 8–23)
CO2: 24 mmol/L (ref 22–32)
CO2: 25 mmol/L (ref 22–32)
Calcium: 9.2 mg/dL (ref 8.9–10.3)
Calcium: 8.9 mg/dL (ref 8.9–10.3)
Chloride: 91 mmol/L — ABNORMAL LOW (ref 98–111)
Chloride: 95 mmol/L — ABNORMAL LOW (ref 98–111)
Creatinine, Ser: 2.87 mg/dL — ABNORMAL HIGH (ref 0.61–1.24)
Creatinine, Ser: 2.24 mg/dL — ABNORMAL HIGH (ref 0.61–1.24)
GFR, Estimated: 23 mL/min — ABNORMAL LOW (ref >60)
GFR, Estimated: 31 mL/min — ABNORMAL LOW (ref >60)
Glucose, Bld: 97 mg/dL (ref 70–99)
Glucose, Bld: 113 mg/dL — ABNORMAL HIGH (ref 70–99)
Phosphorus: 5.7 mg/dL — ABNORMAL HIGH (ref 2.5–4.6)
Phosphorus: 4.9 mg/dL — ABNORMAL HIGH (ref 2.5–4.6)
Potassium: 2.9 mmol/L — ABNORMAL LOW (ref 3.5–5.1)
Potassium: 2.8 mmol/L — ABNORMAL LOW (ref 3.5–5.1)
Sodium: 134 mmol/L — ABNORMAL LOW (ref 135–145)
Sodium: 135 mmol/L (ref 135–145)

## 2021-02-04 LAB — GLUCOSE, CAPILLARY
Glucose-Capillary: 107 mg/dL — ABNORMAL HIGH (ref 70–99)
Glucose-Capillary: 117 mg/dL — ABNORMAL HIGH (ref 70–99)
Glucose-Capillary: 127 mg/dL — ABNORMAL HIGH (ref 70–99)
Glucose-Capillary: 130 mg/dL — ABNORMAL HIGH (ref 70–99)
Glucose-Capillary: 142 mg/dL — ABNORMAL HIGH (ref 70–99)
Glucose-Capillary: 171 mg/dL — ABNORMAL HIGH (ref 70–99)
Glucose-Capillary: 47 mg/dL — ABNORMAL LOW (ref 70–99)
Glucose-Capillary: 63 mg/dL — ABNORMAL LOW (ref 70–99)
Glucose-Capillary: 77 mg/dL (ref 70–99)

## 2021-02-04 LAB — CBC
HCT: 21.4 % — ABNORMAL LOW (ref 39.0–52.0)
Hemoglobin: 6.7 g/dL — CL (ref 13.0–17.0)
MCH: 26.5 pg (ref 26.0–34.0)
MCHC: 31.3 g/dL (ref 30.0–36.0)
MCV: 84.6 fL (ref 80.0–100.0)
Platelets: 262 10*3/uL (ref 150–400)
RBC: 2.53 MIL/uL — ABNORMAL LOW (ref 4.22–5.81)
RDW: 17.9 % — ABNORMAL HIGH (ref 11.5–15.5)
WBC: 7.3 10*3/uL (ref 4.0–10.5)
nRBC: 0 % (ref 0.0–0.2)

## 2021-02-04 LAB — PREPARE RBC (CROSSMATCH)

## 2021-02-04 LAB — MAGNESIUM: Magnesium: 2.7 mg/dL — ABNORMAL HIGH (ref 1.7–2.4)

## 2021-02-04 MED ORDER — DEXTROSE 50 % IV SOLN
INTRAVENOUS | Status: AC
  Administered 2021-02-04: 25 g via INTRAVENOUS
  Filled 2021-02-04: qty 50

## 2021-02-04 MED ORDER — SODIUM CHLORIDE 0.9% IV SOLUTION: Freq: Once | INTRAVENOUS | Status: DC

## 2021-02-04 MED ORDER — FENTANYL CITRATE (PF) 100 MCG/2ML IJ SOLN
INTRAMUSCULAR | Status: AC
  Filled 2021-02-04: qty 2

## 2021-02-04 MED ORDER — FENTANYL CITRATE (PF) 100 MCG/2ML IJ SOLN
INTRAMUSCULAR | Status: DC | PRN
  Administered 2021-02-04: 25 ug via INTRAVENOUS

## 2021-02-04 MED ORDER — LIDOCAINE-EPINEPHRINE 1 %-1:100000 IJ SOLN
INTRAMUSCULAR | Status: AC
  Administered 2021-02-04: 20 mL
  Filled 2021-02-04: qty 1

## 2021-02-04 MED ORDER — CEFAZOLIN SODIUM-DEXTROSE 2-4 GM/100ML-% IV SOLN
INTRAVENOUS | Status: DC | PRN
Start: 2021-02-04 — End: 2021-02-09
  Administered 2021-02-04: 2 g via INTRAVENOUS

## 2021-02-04 MED ORDER — MIDAZOLAM HCL 2 MG/2ML IJ SOLN
INTRAMUSCULAR | Status: DC | PRN
  Administered 2021-02-04: .5 mg via INTRAVENOUS

## 2021-02-04 MED ORDER — DEXTROSE 50 % IV SOLN: 25.0000 g | Freq: Once | INTRAVENOUS | Status: AC

## 2021-02-04 MED ORDER — CEFAZOLIN SODIUM-DEXTROSE 2-4 GM/100ML-% IV SOLN
INTRAVENOUS | Status: AC
  Filled 2021-02-04: qty 100

## 2021-02-04 MED ORDER — HEPARIN SODIUM (PORCINE) 1000 UNIT/ML IJ SOLN
INTRAMUSCULAR | Status: AC
  Administered 2021-02-04: 3.8 mL
  Filled 2021-02-04: qty 1

## 2021-02-04 MED ORDER — MIDAZOLAM HCL 2 MG/2ML IJ SOLN
INTRAMUSCULAR | Status: AC
  Filled 2021-02-04: qty 2

## 2021-02-04 MED ORDER — POTASSIUM CHLORIDE 20 MEQ PO PACK
40.0000 meq | PACK | Freq: Two times a day (BID) | ORAL | Status: AC
  Administered 2021-02-04 (×2): 40 meq
  Filled 2021-02-04 (×2): qty 2

## 2021-02-04 MED ORDER — DEXTROSE 50 % IV SOLN
INTRAVENOUS | Status: AC
  Filled 2021-02-04: qty 50

## 2021-02-04 MED ORDER — DEXTROSE IN LACTATED RINGERS 5 % IV SOLN: INTRAVENOUS | Status: DC

## 2021-02-04 MED ORDER — CEFAZOLIN SODIUM-DEXTROSE 1-4 GM/50ML-% IV SOLN
1.0000 g | Freq: Once | INTRAVENOUS | Status: AC
  Administered 2021-02-04: 1 g via INTRAVENOUS

## 2021-02-04 NOTE — Progress Notes (Signed)
Date and time results received: 02/04/21   Test: HgB Critical Value: 6.7  Name of Provider Notified: STECHSCHULTE  Orders Received? Or Actions Taken?:  Order to transfuse RBC

## 2021-02-04 NOTE — Progress Notes (Signed)
HD in ICU, consent and orders verified for 2000 ml Net UF removal.  Pt drowsy appearing mildly sedated following newly inserted tunneled catheter to R Chest wall. Responds to voice and stimuli. Tachycardia with hx of AFIB, primary RN aware of irregular rate,  on O2 via trach, lung crackles, normo-tensive, generalized edema. Stable for hemodialysis at this time.

## 2021-02-04 NOTE — Plan of Care (Signed)

## 2021-02-04 NOTE — Progress Notes (Signed)
[facility] ROUNDING NOTE   Subjective:   Interval History: This is a 70 year old gentleman who presented 12/28/2020 with a traumatic brain injury subarachnoid hemorrhage subdural hemorrhage temporal bone fracture extending into the middle area occipital bone fracture and right tympanic membrane rupture.  Hospital course was complicated by pulmonary embolus 01/09/2021.  He had acute kidney injury and was started on CVVHD 01/18/2021.  He was transitioned to acute intermittent hemodialysis.  He does not appear to have recovery of renal function.  His last dialysis treatment was 02/02/2021 with 1.6 L removed.  Blood pressure was 107/62 pulse 98 temperature 98.5 O2 sats 95% 21% FiO2 through trach.  Sodium 134 potassium 2.9 chloride 91 CO2 24 BUN 115 creatinine 2.87 glucose 97 phosphorus 5.7 magnesium 2.7 albumin 2.2 hemoglobin 6.7.  Objective:  Vital signs in last 24 hours:  Temp:  [97.8 F (36.6 C)-99.5 F (37.5 C)] 99.5 F (37.5 C) (09/19 0339) Pulse Rate:  [96-122] 115 (09/19 0600) Resp:  [12-27] 18 (09/19 0500) BP: (84-124)/(49-82) 117/57 (09/19 0600) SpO2:  [85 %-100 %] 95 % (09/19 0600) FiO2 (%):  [21 %] 21 % (09/19 0339) Weight:  [108.1 kg] 108.1 kg (09/19 0406)  Weight change: -0.2 kg Filed Weights   02/02/21 1910 02/03/21 0500 02/04/21 0406  Weight: 106.6 kg 107.5 kg 108.1 kg    Intake/Output: I/O last 3 completed shifts: In: 1760.7 [I.V.:380.7; NG/GT:1380] Out: 1800 [Other:1650; Stool:150]   Intake/Output this shift:  No intake/output data recorded.  General: nad, tracheostomy Heart: rrr Lungs: trach, cta bl Abdomen:soft, nontender. Extremities: trace dependent edema. Neurology:Alert awake  Dialysis Access: Left subclavian temporary HD catheter placed on 9/7   Basic Metabolic Panel: Recent Labs  Lab 01/31/21 0520 01/31/21 1632 02/01/21 0445 02/01/21 1829 02/02/21 0353 02/02/21 2147 02/03/21 0339 02/03/21 1600 02/04/21 0313  NA 133*   < > 133*    < > 133* 133* 134* 134* 134*  K 4.4   < > 4.3   < > 3.8 3.1* 3.2* 3.2* 2.9*  CL 93*   < > 93*   < > 91* 95* 93* 94* 91*  CO2 25   < > 22   < > 22 25 25 24 24   GLUCOSE 237*   < > 172*   < > 193* 162* 149* 103* 97  BUN 83*   < > 134*   < > 152* 56* 72* 97* 115*  CREATININE 2.48*   < > 3.74*   < > 3.80* 1.84* 2.51* 2.80* 2.87*  CALCIUM 9.5   < > 10.0   < > 9.3 8.9 9.1 9.1 9.2  MG 2.7*  --  2.9*  --  2.9*  --  2.4  --  2.7*  PHOS 5.0*   < > 7.6*   < > 8.4* 3.6 4.8* 5.8* 5.7*   < > = values in this interval not displayed.    Liver Function Tests: Recent Labs  Lab 02/02/21 0353 02/02/21 2147 02/03/21 0339 02/03/21 1600 02/04/21 0313  ALBUMIN 2.3* 2.4* 2.3* 2.3* 2.2*   No results for input(s): LIPASE, AMYLASE in the last 168 hours. No results for input(s): AMMONIA in the last 168 hours.  CBC: Recent Labs  Lab 01/30/21 0521 01/31/21 0520 02/01/21 0445 02/02/21 2147 02/04/21 0313  WBC 14.0* 10.5 10.8* 7.6 7.3  HGB 8.2* 7.2* 7.0* 7.0* 6.7*  HCT 26.5* 23.5* 22.6* 22.4* 21.4*  MCV 86.0 85.5 85.9 83.9 84.6  PLT 172 152 192 219 262    Cardiac Enzymes:  No results for input(s): CKTOTAL, CKMB, CKMBINDEX, TROPONINI in the last 168 hours.  BNP: Invalid input(s): POCBNP  CBG: Recent Labs  Lab 02/03/21 1112 02/03/21 1619 02/03/21 1947 02/03/21 2321 02/04/21 0328  GLUCAP 171* 108* 124* 123* 107*    Microbiology: Results for orders placed or performed during the hospital encounter of 12/28/20  Resp Panel by RT-PCR (Flu A&B, Covid) Nasopharyngeal Swab     Status: None   Collection Time: 12/28/20  4:17 PM   Specimen: Nasopharyngeal Swab; Nasopharyngeal(NP) swabs in vial transport medium  Result Value Ref Range Status   SARS Coronavirus 2 by RT PCR NEGATIVE NEGATIVE Final    Comment: (NOTE) SARS-CoV-2 target nucleic acids are NOT DETECTED.  The SARS-CoV-2 RNA is generally detectable in upper respiratory specimens during the acute phase of infection. The  lowest concentration of SARS-CoV-2 viral copies this assay can detect is 138 copies/mL. A negative result does not preclude SARS-Cov-2 infection and should not be used as the sole basis for treatment or other patient management decisions. A negative result may occur with  improper specimen collection/handling, submission of specimen other than nasopharyngeal swab, presence of viral mutation(s) within the areas targeted by this assay, and inadequate number of viral copies(<138 copies/mL). A negative result must be combined with clinical observations, patient history, and epidemiological information. The expected result is Negative.  Fact Sheet for Patients:  EntrepreneurPulse.com.au  Fact Sheet for Healthcare Providers:  IncredibleEmployment.be  This test is no t yet approved or cleared by the [place] FDA and  has been authorized for detection and/or diagnosis of SARS-CoV-2 by FDA under an Emergency Use Authorization (EUA). This EUA will remain  in effect (meaning this test can be used) for the duration of the COVID-19 declaration under Section 564(b)(1) of the Act, 21 U.S.C.section 360bbb-3(b)(1), unless the authorization is terminated  or revoked sooner.       Influenza A by PCR NEGATIVE NEGATIVE Final   Influenza B by PCR NEGATIVE NEGATIVE Final    Comment: (NOTE) The Xpert Xpress SARS-CoV-2/FLU/RSV plus assay is intended as an aid in the diagnosis of influenza from Nasopharyngeal swab specimens and should not be used as a sole basis for treatment. Nasal washings and aspirates are unacceptable for Xpert Xpress SARS-CoV-2/FLU/RSV testing.  Fact Sheet for Patients: EntrepreneurPulse.com.au  Fact Sheet for Healthcare Providers: IncredibleEmployment.be  This test is not yet approved or cleared by the [place] FDA and has been authorized for detection and/or diagnosis of SARS-CoV-2 by FDA under  an Emergency Use Authorization (EUA). This EUA will remain in effect (meaning this test can be used) for the duration of the COVID-19 declaration under Section 564(b)(1) of the Act, 21 U.S.C. section 360bbb-3(b)(1), unless the authorization is terminated or revoked.  Performed at [facility], [place] [address], [place], [place] [address]   MRSA Next Gen by PCR, Nasal     Status: None   Collection Time: 12/28/20  7:32 PM   Specimen: Nasal Mucosa; Nasal Swab  Result Value Ref Range Status   MRSA by PCR Next Gen NOT DETECTED NOT DETECTED Final    Comment: (NOTE) The GeneXpert MRSA Assay (FDA approved for NASAL specimens only), is one component of a comprehensive MRSA colonization surveillance program. It is not intended to diagnose MRSA infection nor to guide or monitor treatment for MRSA infections. Test performance is not FDA approved in patients less than 12 years old. Performed at [facility], [place] [address] [place], [place] [address]  Culture, Respiratory w Gram Stain     Status: None   Collection Time: 12/31/20 11:06 AM   Specimen: Tracheal Aspirate; Respiratory  Result Value Ref Range Status   Specimen Description TRACHEAL ASPIRATE  Final   Special Requests NONE  Final   Gram Stain   Final    FEW SQUAMOUS EPITHELIAL CELLS PRESENT FEW WBC PRESENT,BOTH PMN AND MONONUCLEAR FEW GRAM POSITIVE COCCI Performed at [facility], [place] [address], [place], [place] [address]    Culture   Final    FEW PSEUDOMONAS AERUGINOSA FEW STREPTOCOCCUS PNEUMONIAE    Report Status 01/03/2021 FINAL  Final   Organism ID, Bacteria PSEUDOMONAS AERUGINOSA  Final   Organism ID, Bacteria STREPTOCOCCUS PNEUMONIAE  Final      Susceptibility   Pseudomonas aeruginosa - MIC*    CEFTAZIDIME 4 SENSITIVE Sensitive     CIPROFLOXACIN <=0.25 SENSITIVE Sensitive     GENTAMICIN <=1 SENSITIVE Sensitive     IMIPENEM 2 SENSITIVE Sensitive     PIP/TAZO 8 SENSITIVE Sensitive     CEFEPIME 2  SENSITIVE Sensitive     * FEW PSEUDOMONAS AERUGINOSA   Streptococcus pneumoniae - MIC*    ERYTHROMYCIN 4 RESISTANT Resistant     LEVOFLOXACIN 0.5 SENSITIVE Sensitive     VANCOMYCIN <=0.12 SENSITIVE Sensitive     PENO - penicillin <=0.06      PENICILLIN (non-meningitis) <=0.06 SENSITIVE Sensitive     PENICILLIN (oral) <=0.06 SENSITIVE Sensitive     CEFTRIAXONE (non-meningitis) <=0.12 SENSITIVE Sensitive     * FEW STREPTOCOCCUS PNEUMONIAE  Culture, blood (routine x 2)     Status: None   Collection Time: 01/05/21 11:44 AM   Specimen: BLOOD  Result Value Ref Range Status   Specimen Description BLOOD SITE NOT SPECIFIED  Final   Special Requests AEROBIC BOTTLE ONLY Blood Culture adequate volume  Final   Culture   Final    NO GROWTH 5 DAYS Performed at [facility], [address], [place], [place] [address]    Report Status 01/10/2021 FINAL  Final  Culture, blood (routine x 2)     Status: None   Collection Time: 01/05/21 11:44 AM   Specimen: BLOOD  Result Value Ref Range Status   Specimen Description BLOOD SITE NOT SPECIFIED  Final   Special Requests   Final    AEROBIC BOTTLE ONLY Blood Culture results may not be optimal due to an inadequate volume of blood received in culture bottles   Culture   Final    NO GROWTH 5 DAYS Performed at [facility], [place] [address], [place], [place] [address]    Report Status 01/10/2021 FINAL  Final  Surgical PCR screen     Status: None   Collection Time: 01/08/21 12:14 AM   Specimen: Nasal Mucosa; Nasal Swab  Result Value Ref Range Status   MRSA, PCR NEGATIVE NEGATIVE Final   Staphylococcus aureus NEGATIVE NEGATIVE Final    Comment: (NOTE) The Xpert SA Assay (FDA approved for NASAL specimens in patients 21 years of age and older), is one component of a comprehensive surveillance program. It is not intended to diagnose infection nor to guide or monitor treatment. Performed at [facility], [place] [address], [place],  [place] [address]   Culture, Respiratory w Gram Stain     Status: None   Collection Time: 01/08/21  1:24 PM   Specimen: Tracheal Aspirate; Respiratory  Result Value Ref Range Status   Specimen Description TRACHEAL ASPIRATE  Final   Special Requests NONE  Final   Gram Stain   Final    RARE SQUAMOUS EPITHELIAL CELLS PRESENT MODERATE WBC PRESENT, PREDOMINANTLY MONONUCLEAR FEW GRAM NEGATIVE RODS Performed at [facility], [place] [address], [place], [place] [address]    Culture   Final    RARE PSEUDOMONAS AERUGINOSA RARE ENTEROCOCCUS FAECALIS    Report Status 01/11/2021 FINAL  Final   Organism ID, Bacteria PSEUDOMONAS AERUGINOSA  Final   Organism ID, Bacteria ENTEROCOCCUS FAECALIS  Final      Susceptibility   Enterococcus faecalis - MIC*    AMPICILLIN <=2 SENSITIVE Sensitive     VANCOMYCIN 1 SENSITIVE Sensitive     GENTAMICIN SYNERGY SENSITIVE Sensitive     * RARE ENTEROCOCCUS FAECALIS   Pseudomonas aeruginosa - MIC*    CEFTAZIDIME 4 SENSITIVE Sensitive     CIPROFLOXACIN <=0.25 SENSITIVE Sensitive     GENTAMICIN <=1 SENSITIVE Sensitive     IMIPENEM 2 SENSITIVE Sensitive     PIP/TAZO 8 SENSITIVE Sensitive     CEFEPIME 2 SENSITIVE Sensitive     * RARE PSEUDOMONAS AERUGINOSA  Gastrointestinal Panel by PCR , Stool     Status: None   Collection Time: 01/08/21  5:04 PM   Specimen: Stool  Result Value Ref Range Status   Campylobacter species NOT DETECTED NOT DETECTED Final   Plesimonas shigelloides NOT DETECTED NOT DETECTED Final   Salmonella species NOT DETECTED NOT DETECTED Final   Yersinia enterocolitica NOT DETECTED NOT DETECTED Final   Vibrio species NOT DETECTED NOT DETECTED Final   Vibrio cholerae NOT DETECTED NOT DETECTED Final   Enteroaggregative E coli (EAEC) NOT DETECTED NOT DETECTED Final   Enteropathogenic E coli (EPEC) NOT DETECTED NOT DETECTED Final   Enterotoxigenic E coli (ETEC) NOT DETECTED NOT DETECTED Final   Shiga like toxin producing E coli (STEC) NOT DETECTED  NOT DETECTED Final   Shigella/Enteroinvasive E coli (EIEC) NOT DETECTED NOT DETECTED Final   Cryptosporidium NOT DETECTED NOT DETECTED Final   Cyclospora cayetanensis NOT DETECTED NOT DETECTED Final   Entamoeba histolytica NOT DETECTED NOT DETECTED Final   Giardia lamblia NOT DETECTED NOT DETECTED Final   Adenovirus F40/41 NOT DETECTED NOT DETECTED Final   Astrovirus NOT DETECTED NOT DETECTED Final   Norovirus GI/GII NOT DETECTED NOT DETECTED Final   Rotavirus A NOT DETECTED NOT DETECTED Final   Sapovirus (I, II, IV, and V) NOT DETECTED NOT DETECTED Final    Comment: Performed at [facility], [address] [place], [place] [address]  C Difficile Quick Screen (NO PCR Reflex)     Status: None   Collection Time: 01/09/21 11:07 AM   Specimen: STOOL  Result Value Ref Range Status   C Diff antigen NEGATIVE NEGATIVE Final   C Diff toxin NEGATIVE NEGATIVE Final   C Diff interpretation No C. difficile detected.  Final    Comment: Performed at [facility], [place] [address] [place], [place] [address]  Culture, Respiratory w Gram Stain     Status: None   Collection Time: 01/24/21  8:57 AM   Specimen: Tracheal Aspirate; Respiratory  Result Value Ref Range Status   Specimen Description TRACHEAL ASPIRATE  Final   Special Requests NONE  Final   Gram Stain   Final    ABUNDANT WBC PRESENT, PREDOMINANTLY MONONUCLEAR RARE FEW GRAM NEGATIVE RODS    Culture   Final    ABUNDANT PSEUDOMONAS AERUGINOSA Two isolates with different morphologies were identified as the same  organism.The most resistant organism was reported. Performed at [facility], [place] [address] [place], [place] [address]    Report Status 01/27/2021 FINAL  Final   Organism ID, Bacteria PSEUDOMONAS AERUGINOSA  Final      Susceptibility   Pseudomonas aeruginosa - MIC*    CEFTAZIDIME 16 INTERMEDIATE Intermediate     CIPROFLOXACIN 1 SENSITIVE Sensitive     GENTAMICIN <=1 SENSITIVE Sensitive     IMIPENEM 2  SENSITIVE Sensitive     * ABUNDANT PSEUDOMONAS AERUGINOSA    Coagulation Studies: No results for input(s): LABPROT, INR in the last 72 hours.  Urinalysis: No results for input(s): COLORURINE, LABSPEC, PHURINE, GLUCOSEU, HGBUR, BILIRUBINUR, KETONESUR, PROTEINUR, UROBILINOGEN, NITRITE, LEUKOCYTESUR in the last 72 hours.  Invalid input(s): APPERANCEUR    Imaging: No results found.   Medications:    sodium chloride     sodium chloride     sodium chloride     albumin human 25 g (01/30/21 2058)   albumin human     amiodarone 30 mg/hr (02/04/21 0454)   feeding supplement (NEPRO CARB STEADY) 1,000 mL (02/03/21 1320)    sodium chloride   Intravenous Once   acetaminophen  1,000 mg Per Tube Q6H   apixaban  5 mg Per Tube BID   chlorhexidine  15 mL Mouth Rinse BID   Chlorhexidine Gluconate Cloth  6 each Topical Q0600   docusate  100 mg Per Tube BID   feeding supplement (PROSource TF)  45 mL Per Tube BID   guaiFENesin  10 mL Per Tube Q4H   insulin aspart  0-20 Units Subcutaneous Q4H   insulin aspart  10 Units Subcutaneous Q4H   insulin glargine-yfgn  60 Units Subcutaneous BID   mouth rinse  15 mL Mouth Rinse q12n4p   methocarbamol  1,000 mg Per Tube Q8H   metoprolol tartrate  25 mg Per Tube TID   pantoprazole sodium  40 mg Per Tube Daily   polyethylene glycol  17 g Per Tube Daily   QUEtiapine  50 mg Per Tube QHS   senna  1 tablet Per Tube Daily   sodium chloride flush  10-40 mL Intracatheter Q12H   Place/Maintain arterial line **AND** sodium chloride, sodium chloride, sodium chloride, albumin human, albumin human, alteplase, artificial tears, heparin, hydrALAZINE, HYDROmorphone (DILAUDID) injection, lidocaine (PF), lidocaine-prilocaine, midazolam, ondansetron **OR** ondansetron (ZOFRAN) IV, oxyCODONE, pentafluoroprop-tetrafluoroeth, sodium chloride flush  Assessment/ Plan:   #Acute kidney injury, oliguric: Multifactorial etiology including ischemic ATN in the setting of  hypotension, sepsis complicated by contrast injury. CRRT from 9/1-9/9.  No heparin as he is on bivalirudin.  The HD catheter was changed on 9/7.  CRRT restarted on 9/11 given elevated BUN and with more confusion, did not tolerated IHD on 9/10 (confusion, hypotension). CRRT clotted off on 9/13. Did relatively okay with HD 9/14-9/15 overnight -no signs of renal recovery yet -HD 02/04/2021 will attempt to maintain him on a MWF schedule -Will likely need his catheter to be tunneled as there are no signs of renal recovery as of yet, nonurgent, can consult IR early in the week -Continue to monitor strict ins and out, daily labs.   #Fall/bilateral subarachnoid hemorrhage/SDH, TBI/occipital and temporal bone fracture: Per trauma team.  Repeat CT scan with no acute finding.   #Acute respiratory failure: Status post trach on 8/29, trach collar   #A. fib with RVR: On amiodarone, Angiomax.   # Anemia of critical illness: Transfuse as needed.   #Metabolic acidosis: Managed with dialysis. resolved   #  Bilateral pulm embolism: Currently on anticoagulation.   #Acute febrile illness: Per primary team.   #Hyperkalemia:  K wnl now, managing with HD    LOS: [place] @TODAY @7 :18 AM

## 2021-02-04 NOTE — Progress Notes (Addendum)
Hypoglycemic Event  CBG: 47   Treatment: D50 50 mL (25 gm)  Symptoms: Sweaty and Nervous/irritable  Follow-up CBG: Time:  1215 CBG Result: 130  Possible Reasons for Event: Inadequate meal intake; pt is NPO for IR procedure today  Comments/MD notified: Dr. Bobbye Morton notified     Ninfa Meeker

## 2021-02-04 NOTE — Procedures (Signed)
Interventional Radiology Procedure Note  Procedure: Tunneled hemodialysis catheter placement  Findings: Please refer to procedural dictation for full description.  Right IJ 23 cm, 14.5 Fr tunneled HD cathter placed, tip in right atrium.  Complications: None immediate  Estimated Blood Loss: < 5 mL  Recommendations: Catheter ready for immediate use. Formal IR consult to follow regarding gastrostomy tube placement, not performed today due to recent anticoagulation administration.   Ruthann Cancer, MD Pager: [phone]

## 2021-02-04 NOTE — Consult Note (Signed)
Chief Complaint: AKI need for ongoing dialysis access. Request is for tunneled HD catheter placement  Referring Physician(s): Dr. Dennis Bast  Supervising Physician: Ruthann Cancer  Patient Status: [facility] - In-pt  History of Present Illness: Angel [name] is a 70 y.o. 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 Hospital course complicated by AKI.Angel [name] is trached and on the vent. Patient has a left  subclavian temp HD catheter placed on the floor.   Team is requesting a tunneled HD catheter for on going dialysis access.   Patient alert sitting in recliner. Endorses back pain.Denies any fevers, headache, chest pain, SOB, cough, abdominal pain, nausea, vomiting or bleeding.   Return precautions and treatment recommendations and follow-up discussed with the patient and his wife via the telephone. Both of whom are agreeable with the plan.   Past Medical History:  Diagnosis Date   DM (diabetes mellitus) ([place])    HLD (hyperlipidemia)    Hypertension     Past Surgical History:  Procedure Laterality Date   TRACHEOSTOMY TUBE PLACEMENT N/A 01/14/2021   Procedure: TRACHEOSTOMY;  Surgeon: Jesusita Oka, MD;  Location: [facility] OR;  Service: General;  Laterality: N/A;    Allergies: Patient has no known allergies.  Medications: Prior to Admission medications   Medication Sig Start Date End Date Taking? Authorizing Provider  acetaminophen (TYLENOL 8 HOUR ARTHRITIS PAIN) 650 MG CR tablet Take 650 mg by mouth every 8 (eight) hours as needed for pain.   Yes [provider]  allopurinol (ZYLOPRIM) 300 MG tablet Take 300 mg by mouth at bedtime. 11/27/20  Yes [provider]  aspirin EC 81 MG tablet Take 81 mg by mouth at bedtime. Swallow whole.   Yes [provider]  Coenzyme Q10 (COQ10) 100 MG CAPS Take 100 mg by mouth at bedtime.   Yes [provider]  diphenhydrAMINE (BENADRYL) 25 MG tablet Take 25 mg by mouth at bedtime as needed (congestion).   Yes [provider]  Flaxseed, Linseed, (FLAX SEED OIL) 1000 MG CAPS Take 1,000 mg by mouth 2 (two) times daily.   Yes [provider]  fluticasone (FLONASE) 50 MCG/ACT nasal spray Place 2 sprays into both nostrils daily as needed for allergies or rhinitis (congestion). 11/27/20  Yes [provider]  glipiZIDE (GLUCOTROL XL) 2.5 MG 24 hr tablet Take 2.5 mg by mouth daily with breakfast. 11/27/20  Yes [provider]  GLUCOSAMINE-CHONDROITIN-MSM PO Take 1 tablet by mouth daily.   Yes [provider]  ibuprofen (ADVIL) 200 MG tablet Take 200-400 mg by mouth every 6 (six) hours as needed for headache (pain).   Yes [provider]  linagliptin (TRADJENTA) 5 MG TABS tablet Take 5 mg by mouth every morning.   Yes [provider]  metFORMIN (GLUCOPHAGE) 1000 MG tablet Take 1,000 mg by mouth 2 (two) times daily. 11/27/20  Yes [provider]  Omega-3 Fatty Acids (FISH OIL) 1200 MG CAPS Take 1,200 mg by mouth 2 (two) times daily.   Yes [provider]  omeprazole (PRILOSEC) 20 MG capsule Take 20 mg by mouth at bedtime. 11/27/20  Yes [provider]  pioglitazone (ACTOS) 45 MG tablet Take 45 mg by mouth at bedtime. 11/27/20  Yes [provider]  quinapril (ACCUPRIL) 40 MG tablet Take 40 mg by mouth at bedtime. 11/27/20  Yes [provider]  rosuvastatin (CRESTOR) 10 MG tablet Take  10 mg by mouth at bedtime. 11/27/20  Yes [provider]     History reviewed. No pertinent family history.  Social History   Socioeconomic History   Marital status: Married    Spouse name: Not on file   Number of children: Not on file   Years of education: Not on file   Highest education level: Not on file  Occupational History   Not on file  Tobacco Use   Smoking status: Never   Smokeless tobacco:  Never  Vaping Use   Vaping Use: Every day  Substance and Sexual Activity   Alcohol use: Not on file   Drug use: Not on file   Sexual activity: Not on file  Other Topics Concern   Not on file  Social History Narrative   Not on file   Social Determinants of Health   Financial Resource Strain: Not on file  Food Insecurity: Not on file  Transportation Needs: Not on file  Physical Activity: Not on file  Stress: Not on file  Social Connections: Not on file    Review of Systems: A 12 point ROS discussed and pertinent positives are indicated in the HPI above.  All other systems are negative.  Review of Systems  Constitutional:  Negative for fever.  HENT:  Negative for congestion.   Respiratory:  Negative for cough and shortness of breath.   Cardiovascular:  Negative for chest pain.  Gastrointestinal:  Negative for abdominal pain.  Musculoskeletal:  Positive for back pain.  Neurological:  Negative for headaches.  Psychiatric/Behavioral:  Negative for behavioral problems and confusion.    Vital Signs: BP 130/70   Pulse (!) 105   Temp 98 F (36.7 C) (Axillary)   Resp 17   Ht 6\' 2"  (1.88 m)   Wt 238 lb 5.1 oz (108.1 kg)   SpO2 95%   BMI 30.60 kg/m   Physical Exam Vitals and nursing note reviewed.  Constitutional:      Appearance: He is well-developed.  Cardiovascular:     Rate and Rhythm: Regular rhythm. Tachycardia present.  Pulmonary:     Breath sounds: Rhonchi present.     Comments: Inspiratory. Trached on vent.  Musculoskeletal:        General: Normal range of motion.     Cervical back: Normal range of motion.  Skin:    General: Skin is dry.  Neurological:     Mental Status: He is alert.    Imaging: CT HEAD WO CONTRAST (5MM)  Result Date: 01/27/2021 CLINICAL DATA:  Altered mental status. EXAM: CT HEAD WITHOUT CONTRAST TECHNIQUE: Contiguous axial images were obtained from the base of the skull through the vertex without intravenous contrast. COMPARISON:  Head  CT dated 12/29/2020. FINDINGS: Brain: Mild age-related atrophy and chronic microvascular ischemic changes. Small hypodense extra-axial fluid in the subdural spaces along the occipital lobes measuring up to 6 mm in thickness on the left likely represent subacute or chronic bleed or hygroma or possibly sequela of previously seen intracranial hemorrhage. No acute intracranial hemorrhage. No midline shift. Vascular: No hyperdense vessel or unexpected calcification. Skull: Normal. Negative for fracture or focal lesion. Sinuses/Orbits: There is mild diffuse mucoperiosteal thickening of paranasal sinuses. Partially visualized nasogastric tube. Bilateral mastoid effusions, right greater than left. Other: None IMPRESSION: 1. No acute intracranial hemorrhage. Probable small old subdural collection or hygroma along the occipital lobes. 2. Mild age-related atrophy and chronic microvascular ischemic changes. 3. Bilateral mastoid effusions, right greater than left. Electronically Signed   By:  Anner Crete M.D.   On: 01/27/2021 03:33   CT Angio Chest Pulmonary Embolism (PE) W or WO Contrast  Result Date: 01/08/2021 CLINICAL DATA:  Concern for pulmonary embolism on preceding abdominal CT performed 01/08/2021 EXAM: CT ANGIOGRAPHY CHEST WITH CONTRAST TECHNIQUE: Multidetector CT imaging of the chest was performed using the standard protocol during bolus administration of intravenous contrast. Multiplanar CT image reconstructions and MIPs were obtained to evaluate the vascular anatomy. CONTRAST:  59mL OMNIPAQUE IOHEXOL 350 MG/ML SOLN COMPARISON:  CT abdomen pelvis-01/08/2021 FINDINGS: Vascular Findings: There is adequate opacification of the pulmonary arterial system with the main pulmonary artery measuring 279 Hounsfield units. There is an occlusive filling defect involving the segmental division of the right lower lobe pulmonary artery with associated oligemia of the posterior and medial basilar segments of the right lower  lobe (axial image 69, series 5; coronal image 100, series 8). Additionally, there are nonocclusive filling defects within the left lower anteromedial segmental pulmonary artery (axial image 76, series 5; coronal image 102, series 8) as well as the lingular branch of the left pulmonary artery (axial image 65, series 5, coronal image 94, series 8). Overall clot burden is deemed small in volume. There is no CT evidence of intraventricular septal bowing to suggest right-sided heart strain. Normal caliber of the main pulmonary artery. Borderline cardiomegaly. Coronary artery calcifications. No pericardial effusion. No evidence of thoracic aortic aneurysm or dissection. Bovine configuration of the aortic arch. Review of the MIP images confirms the above findings. ---------------------------------------------------------------------------------- Nonvascular Findings: Mediastinum/Lymph Nodes: No bulky mediastinal, hilar axillary lymphadenopathy. Lungs/Pleura: Extensive consolidative opacities and air bronchograms, most severely affecting the bilateral lower lobes as well as the dependent portion of the bilateral upper lobes. This finding is again associated with relative oligemia involving the posterior basilar segment of the right lower lobe. Endotracheal tube terminates superior to the carina. Rather extensive air bronchograms are seen involving the bilateral lower lobes as well as the posterior aspects of the bilateral upper lobes. Small/trace potentially loculated bilateral pleural effusions, right slightly greater than left. No pneumothorax. Upper abdomen: Limited early arterial phase evaluation of the upper abdomen demonstrates an enteric tube tip terminating at the level of the gastric antrum. There is a minimal amount of high density material seen within the gallbladder, likely vicarious excretion of contrast. Redemonstrated marked distension of the imaged portions of the mid transverse colon. Musculoskeletal:  Thyromegaly with suspected approximately 3.6 x 3.6 cm hypoattenuating right-sided thyroid nodule/mass (image 1, series 5). IMPRESSION: 1. Examination is positive for bilateral pulmonary embolism, most significantly affecting the right lower lobe pulmonary artery with associated reduced perfusion of the posterior and medial basilar segments of the right lower lobe, potentially indicative of infarction. Overall clot burden is deemed small in volume and there is no CT evidence of right-sided heart strain. 2. Extensive bilateral consolidative opacities and associated air bronchograms, most significantly affecting the bilateral lower lobes, worrisome for multifocal infection and/or aspiration. 3. Small/trace potentially partially loculated bilateral effusions, right greater than left. Effusions are currently too small to warrant bedside or ultrasound-guided aspiration. 4. Coronary calcifications.  Aortic Atherosclerosis (ICD10-I70.0). 5. Indeterminate approximately 3.6 cm right-sided thyroid nodule/mass. Further evaluation with nonemergent thyroid ultrasound could be performed as indicated. Above findings discussed with Dr. Bobbye Morton at the time of procedure completion. Electronically Signed   By: Sandi Mariscal M.D.   On: 01/08/2021 16:31   CT ABDOMEN PELVIS W CONTRAST  Result Date: 01/08/2021 CLINICAL DATA:  Abdominal pain, fever EXAM: CT ABDOMEN AND PELVIS  WITH CONTRAST TECHNIQUE: Multidetector CT imaging of the abdomen and pelvis was performed using the standard protocol following bolus administration of intravenous contrast. CONTRAST:  149mL OMNIPAQUE IOHEXOL 350 MG/ML SOLN COMPARISON:  12/28/2020 FINDINGS: Lower chest: Interval increase in bilateral pleural effusions, moderate on the right, small on the left. There is near complete atelectasis or consolidation of the right lower lobe and ground-glass airspace clearing in the deep lung base (series 5, image 13). Appearance of the right lower lobar pulmonary arteries  is suspicious for embolus, although poorly evaluated on this non tailored examination (series 3, image 3). Coronary artery calcifications. Hepatobiliary: No solid liver abnormality is seen. No gallstones, gallbladder wall thickening, or biliary dilatation. Pancreas: Unremarkable. No pancreatic ductal dilatation or surrounding inflammatory changes. Spleen: Normal in size without significant abnormality. Adrenals/Urinary Tract: Adrenal glands are unremarkable. Simple bilateral renal cysts. Kidneys are otherwise normal, without renal calculi, solid lesion, or hydronephrosis. Bladder is unremarkable. Stomach/Bowel: Stomach is within normal limits. Enteric feeding tube is position with tip in the gastric antrum. Appendix appears normal. The colon is diffusely dilated and fluid-filled to the rectum, the cecum measuring up to 10.2 cm in caliber (series 8, image 80). Vascular/Lymphatic: Aortic atherosclerosis. No enlarged abdominal or pelvic lymph nodes. Reproductive: No mass or other significant abnormality. Other: Anasarca. Small, fat containing bilateral inguinal hernias. Trace ascites throughout the abdomen and pelvis. Musculoskeletal: No acute or significant osseous findings. IMPRESSION: 1. Interval increase in bilateral pleural effusions, moderate on the right, small on the left. There is near complete atelectasis or consolidation of the right lower lobe and ground-glass airspace clearing in the deep lung base. Appearance of the right lower lobar pulmonary arteries is suspicious for embolus, especially given appearance of the lung parenchyma which is characteristic of pulmonary infarction, although poorly evaluated on this non tailored examination. Consider CT pulmonary angiogram to further evaluate. 2. The colon is diffusely dilated and fluid-filled to the rectum, the cecum measuring up to 10.2 cm in caliber. Findings are consistent with diarrheal illness and potentially consistent with toxic megacolon. 3. Trace  ascites throughout the abdomen and pelvis. Anasarca. 4. Coronary artery disease. Aortic Atherosclerosis (ICD10-I70.0). Electronically Signed   By: Eddie Candle M.D.   On: 01/08/2021 12:35   US RENAL  Result Date: 01/13/2021 CLINICAL DATA:  Acute renal insufficiency EXAM: RENAL / URINARY TRACT ULTRASOUND COMPLETE COMPARISON:  None. FINDINGS: Right Kidney: Renal measurements: 14.9 x 6.6 x 6.3 cm = volume: 324 mL. Contains a 4.8 cm simple cyst. Left Kidney: Renal measurements: 16.2 x 7.4 x 6.9 cm = volume: 433 mL. Contains a 3.9 cm simple cyst. Bladder: Appears normal for degree of bladder distention. Other: None. IMPRESSION: A 4.8 cm simple cyst is seen in the right kidney and a 3.9 cm simple cyst is seen in the left kidney. Neither cyst requires follow-up. No other abnormalities. Electronically Signed   By: Dorise Bullion III M.D.   On: 01/13/2021 16:22   DG CHEST PORT 1 VIEW  Result Date: 01/28/2021 CLINICAL DATA:  Aspiration. EXAM: PORTABLE CHEST 1 VIEW COMPARISON:  Chest radiograph, 01/23/2021.  CT chest, 01/08/2021. FINDINGS: Support lines: LEFT subclavian temporary dialysis catheter, with tip at the brachiocephalic venous junction. Tracheostomy with tube tip at the midthoracic trachea. RIGHT upper extremity PICC with the tip within the RIGHT atrium. Enteric feeding tube, with tip excluded from view. Cardiomediastinal silhouette is unchanged. Hypoinflation. Relative decreased appearance of patchy bibasilar opacities. No large pleural effusion. No pneumothorax. No interval osseous abnormality. IMPRESSION: 1. Mildly improved  aeration, with relative decreased appearance of bibasilar opacities. 2. Lines and tubes as above. Electronically Signed   By: Michaelle Birks M.D.   On: 01/28/2021 07:44   DG CHEST PORT 1 VIEW  Result Date: 01/23/2021 CLINICAL DATA:  Central line placement EXAM: PORTABLE CHEST 1 VIEW COMPARISON:  Same day radiograph FINDINGS: Unchanged cardiomediastinal silhouette. Unchanged  tracheostomy tube, right upper extremity PICC, and partially imaged enteric feeding tube. Persistent bilateral effusions and diffuse bilateral interstitial opacities. No visible pneumothorax. Bones are unchanged. The left approach central venous catheter tip overlies the distal superior vena cava as clinically questioned. IMPRESSION: Left upper central venous catheter tip overlies the distal superior vena cava. Unchanged pulmonary edema and layering bilateral pleural effusions. Electronically Signed   By: Maurine Simmering M.D.   On: 01/23/2021 13:02   DG CHEST PORT 1 VIEW  Result Date: 01/23/2021 CLINICAL DATA:  Respiratory failure, tracheostomy EXAM: PORTABLE CHEST 1 VIEW COMPARISON:  01/17/2021 FINDINGS: No significant interval change in AP portable chest radiograph, with gross cardiomegaly and so port apparatus including tracheostomy, left chest large bore multi lumen vascular catheter, and right upper extremity PICC, tip again projecting over the right atrium. Partially imaged enteric feeding tube. Diffuse bilateral interstitial opacity and layering bilateral pleural effusions. No new airspace opacity. IMPRESSION: 1. No significant interval change in AP portable chest radiograph, with gross cardiomegaly, diffuse bilateral interstitial pulmonary opacity, and layering pleural effusions, constellation of findings most consistent with edema. 2. Unchanged support apparatus including tracheostomy, left chest large bore multi lumen vascular catheter, right upper extremity PICC, and partially imaged enteric feeding tube. Electronically Signed   By: Eddie Candle M.D.   On: 01/23/2021 09:10   DG CHEST PORT 1 VIEW  Result Date: 01/17/2021 CLINICAL DATA:  Dialysis catheter placement. EXAM: PORTABLE CHEST 1 VIEW COMPARISON:  January 16, 2021. FINDINGS: Stable cardiomegaly. Tracheostomy tube is unchanged in position. Feeding tube is seen entering stomach. Interval placement of left subclavian catheter with distal tip in  expected position of the SVC. Right-sided PICC line is unchanged. No pneumothorax is noted. Right basilar opacity is noted concerning for atelectasis and effusion. Bony thorax is unremarkable. IMPRESSION: Interval placement of left subclavian catheter with distal tip in expected position of the SVC. No pneumothorax is noted. These results were called by telephone at the time of interpretation on 01/17/2021 at 1:20 pm to provider [facility] , who verbally acknowledged these results. Electronically Signed   By: Marijo Conception M.D.   On: 01/17/2021 13:20   DG CHEST PORT 1 VIEW  Result Date: 01/16/2021 CLINICAL DATA:  Pulmonary edema EXAM: PORTABLE CHEST 1 VIEW COMPARISON:  01/15/2021 FINDINGS: Tracheostomy tube in gastric catheter are again noted and stable. Cardiac shadow is mildly enlarged but stable. Right-sided PICC line is again seen and unchanged in position. Lungs are well aerated bilaterally. Right-sided pleural effusion is noted slightly decreased from the prior exam although this may be positional in nature. No focal confluent infiltrate is noted. Mild vascular congestion is again seen. IMPRESSION: Stable vascular congestion. Right-sided effusion which appears slightly smaller than that noted on the prior exam. Tubes and lines as described. Electronically Signed   By: Inez Catalina M.D.   On: 01/16/2021 08:24   DG Chest Port 1 View  Result Date: 01/15/2021 CLINICAL DATA:  Respiratory failure EXAM: PORTABLE CHEST 1 VIEW COMPARISON:  01/14/2021 FINDINGS: Cardiac shadow remains enlarged. Tracheostomy tube and gastric catheter are again seen and stable. Right-sided PICC line is noted in the right  atrium stable in appearance. Right-sided pleural effusion is noted similar to that seen on the prior exam. Persistent vascular congestion is noted. No bony abnormality is noted. IMPRESSION: Vascular congestion and right-sided effusion stable from the prior exam. Electronically Signed   By: Inez Catalina M.D.    On: 01/15/2021 08:55   DG Chest Port 1 View  Result Date: 01/14/2021 CLINICAL DATA:  Respiratory failure. EXAM: PORTABLE CHEST 1 VIEW COMPARISON:  Chest radiograph dated 01/14/2021. FINDINGS: Tracheostomy remains above the carina and enteric tube with tip in the proximal stomach. The enteric tube has been pulled back. Right-sided PICC with tip close to the cavoatrial junction. Cardiomegaly with vascular congestion and edema. Small bilateral pleural effusions and bibasilar atelectasis or infiltrate. No pneumothorax. No acute osseous pathology. IMPRESSION: Cardiomegaly with findings of CHF similar or slightly worsened since the prior radiograph. Superimposed pneumonia is not excluded clinical correlation is recommended. Electronically Signed   By: Anner Crete M.D.   On: 01/14/2021 19:11   DG CHEST PORT 1 VIEW  Result Date: 01/14/2021 CLINICAL DATA:  Respiratory failure, ventilatory support EXAM: PORTABLE CHEST 1 VIEW COMPARISON:  01/11/2021 FINDINGS: Stable cardiomegaly and vascular congestion. Improving basilar atelectasis. Posterior layering right effusion noted. No pneumothorax. Endotracheal tube 6 cm above the carina. NG tube enters the stomach with the tip not visualized. Degenerative changes of the spine. IMPRESSION: Stable cardiomegaly and vascular congestion. Improving basilar atelectasis Small posterior layering right effusion Electronically Signed   By: Jerilynn Mages.  Shick M.D.   On: 01/14/2021 08:06   DG CHEST PORT 1 VIEW  Result Date: 01/11/2021 CLINICAL DATA:  Reintubated EXAM: PORTABLE CHEST 1 VIEW COMPARISON:  Chest radiograph 1 day prior FINDINGS: There is an endotracheal tube in place with the tip terminating approximately 2.6 cm from the carina. A right upper extremity PICC is in place. The tip is not well delineated due to poor penetration. The heart is enlarged and the mediastinum is prominent, similar to the prior study. There are diffusely increased interstitial opacities, not  significantly changed compared to the prior study allowing for difference in patient rotation. The right costophrenic angle is cut off. There is no significant left effusion. There is no pneumothorax. IMPRESSION: 1. Endotracheal tube approximately 2.6 cm from the carina. 2. Diffuse interstitial opacities suggesting pulmonary edema, overall not significantly changed allowing for difference in technique. Electronically Signed   By: Valetta Mole M.D.   On: 01/11/2021 15:59   DG CHEST PORT 1 VIEW  Result Date: 01/10/2021 CLINICAL DATA:  Emesis EXAM: PORTABLE CHEST 1 VIEW COMPARISON:  01/09/2021 FINDINGS: Endotracheal tube and gastric catheter are noted in satisfactory position. Feeding catheter extends into the stomach as well. Right-sided PICC line is in satisfactory position. Cardiac shadow is enlarged but accentuated by the frontal technique. Some improved aeration is noted in the right base although small right-sided effusion remains. No bony abnormality is seen. IMPRESSION: Tubes and lines in satisfactory position. Slight improved aeration in the right base although right-sided effusion remains. Electronically Signed   By: Inez Catalina M.D.   On: 01/10/2021 23:40   DG Chest Port 1 View  Result Date: 01/09/2021 CLINICAL DATA:  Head injury EXAM: PORTABLE CHEST 1 VIEW, portable abdomen 1 view COMPARISON:  Chest x-ray dated December 18, 2020 FINDINGS: Chest: ETT and enteric feeding tube are unchanged in position. Increased right basilar opacity. Small bilateral pleural effusions. No evidence of pneumothorax. Cardiac and mediastinal contours are unchanged. Abdomen: Enteric feeding tube tip projects over the expected area of the  stomach. Dilated gas-filled loops of predominantly large bowel. Contrast material seen the bladder. IMPRESSION: Feeding tube tip projects over the expected area of the stomach. Increased right basilar opacity, possibly due to worsening atelectasis, infection or aspiration. Dilated gas-filled  loops of predominantly large bowel, better evaluated on recent CT. Electronically Signed   By: Yetta Glassman M.D.   On: 01/09/2021 08:18   DG Chest Port 1 View  Result Date: 01/07/2021 CLINICAL DATA:  Respiratory failure. EXAM: PORTABLE CHEST 1 VIEW COMPARISON:  01/02/2021 FINDINGS: Endotracheal tube is 4.1 cm above the carina. Stable enlargement of the cardiothymic silhouette. Feeding tube extends into the abdomen but the tip is beyond the image. Again noted are low lung volumes. Slightly increased densities at the right lung base. Negative for a pneumothorax. IMPRESSION: 1. Slightly increased densities at the right lung base. Findings could represent atelectasis versus airspace disease. 2. Low lung volumes. 3.  Endotracheal tube is appropriately positioned. Electronically Signed   By: Markus Daft M.D.   On: 01/07/2021 08:11   DG Abd Portable 1V  Result Date: 01/27/2021 CLINICAL DATA:  Vomiting.  Fell down stairs. EXAM: PORTABLE ABDOMEN - 1 VIEW COMPARISON:  01/16/2021 FINDINGS: Normal bowel gas pattern. Feeding tube tip in the mid stomach. No visible free peritoneal air. Lumbar and lower thoracic spine degenerative changes. IMPRESSION: Feeding tube tip in the mid stomach.  No acute abnormality. Electronically Signed   By: Claudie Revering M.D.   On: 01/27/2021 10:22   DG Abd Portable 1V  Result Date: 01/16/2021 CLINICAL DATA:  Check feeding catheter placement EXAM: PORTABLE ABDOMEN - 1 VIEW COMPARISON:  None. FINDINGS: Weighted feeding catheter is noted in the distal aspect of the stomach. Nonobstructive bowel gas pattern is noted. IMPRESSION: Feeding catheter within the distal stomach. Electronically Signed   By: Inez Catalina M.D.   On: 01/16/2021 14:33   DG Abd Portable 1V  Result Date: 01/11/2021 CLINICAL DATA:  OG tube placement EXAM: PORTABLE ABDOMEN - 1 VIEW COMPARISON:  01/09/2021 FINDINGS: Enteric tube tip overlies the distal stomach/gastroduodenal junction. Upper gas pattern is unremarkable  IMPRESSION: Esophageal tube tip overlies the distal stomach/gastroduodenal junction Electronically Signed   By: Donavan Foil M.D.   On: 01/11/2021 16:15   DG Abd Portable 1V  Result Date: 01/09/2021 CLINICAL DATA:  Head injury EXAM: PORTABLE CHEST 1 VIEW, portable abdomen 1 view COMPARISON:  Chest x-ray dated December 18, 2020 FINDINGS: Chest: ETT and enteric feeding tube are unchanged in position. Increased right basilar opacity. Small bilateral pleural effusions. No evidence of pneumothorax. Cardiac and mediastinal contours are unchanged. Abdomen: Enteric feeding tube tip projects over the expected area of the stomach. Dilated gas-filled loops of predominantly large bowel. Contrast material seen the bladder. IMPRESSION: Feeding tube tip projects over the expected area of the stomach. Increased right basilar opacity, possibly due to worsening atelectasis, infection or aspiration. Dilated gas-filled loops of predominantly large bowel, better evaluated on recent CT. Electronically Signed   By: Yetta Glassman M.D.   On: 01/09/2021 08:18   ECHOCARDIOGRAM COMPLETE  Result Date: 01/10/2021    ECHOCARDIOGRAM REPORT   Patient Name:   Angel [name] Date of Exam: 01/10/2021 Medical Rec #:  [id number]   Height:       74.0 in Accession #:    [id number]  Weight:       288.8 lb Date of Birth:  [birth year]   BSA:          2.541 m Patient Age:  70 years    BP:           113/58 mmHg Patient Gender: M           HR:           63 bpm. Exam Location:  Inpatient Procedure: 2D Echo, Cardiac Doppler, Color Doppler and Intracardiac            Opacification Agent                        STAT ECHO Reported to: Dr. Kirk Ruths on 01/10/2021 2:14:00 PM. Indications:    R94.31 Abnormal EKG  History:        Patient has prior history of Echocardiogram examinations, most                 recent 01/07/2021. Risk Factors:Hypertension, Diabetes and                 Dyslipidemia.  Sonographer:    Jonelle Sidle Dance RVT Referring Phys: [id number] [facility]  CHANDRASEKHAR  Sonographer Comments: Echo performed with patient supine and on artificial respirator, Technically difficult study due to poor echo windows and no subcostal window. IMPRESSIONS  1. Left ventricular ejection fraction, by estimation, is 60 to 65%. The left ventricle has normal function. The left ventricle has no regional wall motion abnormalities. There is mild left ventricular hypertrophy. Left ventricular diastolic parameters were normal.  2. Right ventricular systolic function is mildly reduced. The right ventricular size is not well visualized.  3. The mitral valve is normal in structure. Trivial mitral valve regurgitation. No evidence of mitral stenosis.  4. The aortic valve is tricuspid. Aortic valve regurgitation is not visualized. Mild aortic valve sclerosis is present, with no evidence of aortic valve stenosis. FINDINGS  Left Ventricle: Left ventricular ejection fraction, by estimation, is 60 to 65%. The left ventricle has normal function. The left ventricle has no regional wall motion abnormalities. Definity contrast agent was given IV to delineate the left ventricular  endocardial borders. The left ventricular internal cavity size was normal in size. There is mild left ventricular hypertrophy. Left ventricular diastolic parameters were normal. Right Ventricle: The right ventricular size is not well visualized. Right vetricular wall thickness was not well visualized. Right ventricular systolic function is mildly reduced. Left Atrium: Left atrial size was normal in size. Right Atrium: Right atrial size was not well visualized. Pericardium: There is no evidence of pericardial effusion. Mitral Valve: The mitral valve is normal in structure. Mild mitral annular calcification. Trivial mitral valve regurgitation. No evidence of mitral valve stenosis. Tricuspid Valve: The tricuspid valve is normal in structure. Tricuspid valve regurgitation is trivial. No evidence of tricuspid stenosis. Aortic Valve:  The aortic valve is tricuspid. Aortic valve regurgitation is not visualized. Mild aortic valve sclerosis is present, with no evidence of aortic valve stenosis. Pulmonic Valve: The pulmonic valve was not well visualized. Pulmonic valve regurgitation is not visualized. No evidence of pulmonic stenosis. Aorta: The aortic root is normal in size and structure. Venous: The inferior vena cava was not well visualized. IAS/Shunts: The interatrial septum was not well visualized.  LEFT VENTRICLE PLAX 2D LVIDd:         4.60 cm  Diastology LVIDs:         3.00 cm  LV e' medial:    9.36 cm/s LV PW:         1.50 cm  LV E/e' medial:  11.3 LV IVS:  1.10 cm  LV e' lateral:   9.36 cm/s LVOT diam:     1.80 cm  LV E/e' lateral: 11.3 LV SV:         50 LV SV Index:   20 LVOT Area:     2.54 cm  RIGHT VENTRICLE TAPSE (M-mode): 2.4 cm LEFT ATRIUM             Index LA diam:        4.40 cm 1.73 cm/m LA Vol (A2C):   87.6 ml 34.48 ml/m LA Vol (A4C):   43.2 ml 17.00 ml/m LA Biplane Vol: 63.4 ml 24.95 ml/m  AORTIC VALVE LVOT Vmax:   106.50 cm/s LVOT Vmean:  70.300 cm/s LVOT VTI:    0.195 m  AORTA Ao Root diam: 3.30 cm Ao Asc diam:  2.50 cm MITRAL VALVE MV Area (PHT): 3.91 cm     SHUNTS MV Decel Time: 194 msec     Systemic VTI:  0.20 m MV E velocity: 106.00 cm/s  Systemic Diam: 1.80 cm MV A velocity: 61.20 cm/s MV E/A ratio:  1.73 Kirk Ruths MD Electronically signed by Kirk Ruths MD Signature Date/Time: 01/10/2021/2:29:20 PM    Final    ECHOCARDIOGRAM COMPLETE  Result Date: 01/07/2021    ECHOCARDIOGRAM REPORT   Patient Name:   Angel [name] Date of Exam: 01/07/2021 Medical Rec #:  [id number]   Height:       74.0 in Accession #:    [id number]  Weight:       294.8 lb Date of Birth:  July 27, 1950   BSA:          2.563 m Patient Age:    53 years    BP:           137/77 mmHg Patient Gender: M           HR:           111 bpm. Exam Location:  Inpatient Procedure: 2D Echo, Cardiac Doppler, Color Doppler, 3D Echo and Intracardiac             Opacification Agent Indications:    Atrial Fibrillation  History:        Patient has no prior history of Echocardiogram examinations.                 Patient fell from stairs and cracked his skull.  Sonographer:    Merrie Roof RDCS Referring Phys: [id number] [facility]  Sonographer Comments: Suboptimal apical window, no subcostal window, patient is morbidly obese and echo performed with patient supine and on artificial respirator. Image acquisition challenging due to patient body habitus. IMPRESSIONS  1. Left ventricular ejection fraction, by estimation, is 60 to 65%. The left ventricle has normal function. The left ventricle has no regional wall motion abnormalities. There is mild left ventricular hypertrophy. Left ventricular diastolic parameters are indeterminate.  2. Right ventricule is poorly visualized but grossly normal size and systolic function  3. Left atrial size was mildly dilated.  4. Right atrial size was mildly dilated.  5. The mitral valve is normal in structure. No evidence of mitral valve regurgitation. No evidence of mitral stenosis.  6. The aortic valve was not well visualized. Aortic valve regurgitation is not visualized. No aortic stenosis is present. FINDINGS  Left Ventricle: Left ventricular ejection fraction, by estimation, is 60 to 65%. The left ventricle has normal function. The left ventricle has no regional wall motion abnormalities. Definity contrast agent was given IV to delineate the  left ventricular  endocardial borders. The left ventricular internal cavity size was normal in size. There is mild left ventricular hypertrophy. Left ventricular diastolic parameters are indeterminate. Right Ventricle: The right ventricular size is not well visualized. Right vetricular wall thickness was not well visualized. Right ventricular systolic function was not well visualized. Left Atrium: Left atrial size was mildly dilated. Right Atrium: Right atrial size was mildly dilated. Pericardium:  There is no evidence of pericardial effusion. Presence of pericardial fat pad. Mitral Valve: The mitral valve is normal in structure. No evidence of mitral valve regurgitation. No evidence of mitral valve stenosis. Tricuspid Valve: The tricuspid valve is normal in structure. Tricuspid valve regurgitation is trivial. Aortic Valve: The aortic valve was not well visualized. Aortic valve regurgitation is not visualized. No aortic stenosis is present. Aortic valve mean gradient measures 5.0 mmHg. Aortic valve peak gradient measures 9.1 mmHg. Aortic valve area, by VTI measures 2.97 cm. Pulmonic Valve: The pulmonic valve was not well visualized. Pulmonic valve regurgitation is not visualized. Aorta: The aortic root is normal in size and structure. IAS/Shunts: The interatrial septum was not well visualized.  LEFT VENTRICLE PLAX 2D LVIDd:         4.35 cm LVIDs:         3.20 cm LV PW:         1.20 cm LV IVS:        1.25 cm LVOT diam:     2.00 cm LV SV:         53 LV SV Index:   21 LVOT Area:     3.14 cm  RIGHT VENTRICLE RV Basal diam:  3.20 cm LEFT ATRIUM            Index       RIGHT ATRIUM           Index LA diam:      4.25 cm  1.66 cm/m  RA Area:     23.60 cm LA Vol (A4C): 104.0 ml 40.58 ml/m RA Volume:   80.30 ml  31.33 ml/m  AORTIC VALVE AV Area (Vmax):    2.68 cm AV Area (Vmean):   2.33 cm AV Area (VTI):     2.97 cm AV Vmax:           151.00 cm/s AV Vmean:          111.000 cm/s AV VTI:            0.179 m AV Peak Grad:      9.1 mmHg AV Mean Grad:      5.0 mmHg LVOT Vmax:         129.00 cm/s LVOT Vmean:        82.200 cm/s LVOT VTI:          0.169 m LVOT/AV VTI ratio: 0.94  AORTA Ao Root diam: 3.90 cm  SHUNTS Systemic VTI:  0.17 m Systemic Diam: 2.00 cm Oswaldo Milian MD Electronically signed by Oswaldo Milian MD Signature Date/Time: 01/07/2021/7:04:11 PM    Final    VAS [place] LOWER EXTREMITY VENOUS (DVT)  Result Date: 01/06/2021  Lower Venous DVT Study Patient Name:  Angel [name]  Date of Exam:    01/06/2021 Medical Rec #: [id number]    Accession #:    [id number] Date of Birth: [birth year]    Patient Gender: M Patient Age:   49 years Exam Location:  [facility] Procedure:      VAS [place] LOWER EXTREMITY VENOUS (DVT) Referring Phys:  ARMANDO RAMIREZ --------------------------------------------------------------------------------  Indications: Fever.  Limitations: Body habitus, poor ultrasound/[name] interface and patient movement (tightening and moving legs). Comparison Study: No previous exams Performing Technologist: Jody Hill RVT, RDMS  Examination Guidelines: A complete evaluation includes B-mode imaging, spectral Doppler, color Doppler, and power Doppler as needed of all accessible portions of each vessel. Bilateral testing is considered an integral part of a complete examination. Limited examinations for reoccurring indications may be performed as noted. The reflux portion of the exam is performed with the patient in reverse Trendelenburg.  +---------+---------------+---------+-----------+----------+--------------+ LEFT     CompressibilityPhasicitySpontaneityPropertiesThrombus Aging +---------+---------------+---------+-----------+----------+--------------+ CFV      Full           Yes      Yes                                 +---------+---------------+---------+-----------+----------+--------------+ SFJ      Full                                                        +---------+---------------+---------+-----------+----------+--------------+ FV Prox  Full           Yes      Yes                                 +---------+---------------+---------+-----------+----------+--------------+ FV Mid   Full           Yes      Yes                                 +---------+---------------+---------+-----------+----------+--------------+ FV DistalFull           Yes      Yes                                  +---------+---------------+---------+-----------+----------+--------------+ PFV      Full                                                        +---------+---------------+---------+-----------+----------+--------------+ POP      Full           Yes      Yes                                 +---------+---------------+---------+-----------+----------+--------------+ PTV      Full                                                        +---------+---------------+---------+-----------+----------+--------------+ PERO  Not visualized +---------+---------------+---------+-----------+----------+--------------+     Summary: LEFT: - There is no evidence of deep vein thrombosis in the lower extremity.  *See table(s) above for measurements and observations. Electronically signed by Deitra Mayo MD on 01/06/2021 at 7:23:09 PM.    Final    VAS [place] UPPER EXTREMITY VENOUS DUPLEX  Result Date: 01/08/2021 UPPER VENOUS STUDY  Patient Name:  Angel [name]  Date of Exam:   01/08/2021 Medical Rec #: [id number]    Accession #:    [id number] Date of Birth: October 18, 1950    Patient Gender: M Patient Age:   23 years Exam Location:  [facility] Procedure:      VAS [place] UPPER EXTREMITY VENOUS DUPLEX Referring Phys: Reather Laurence --------------------------------------------------------------------------------  Indications: fever Comparison Study: no prior Performing Technologist: Archie Patten RVS  Examination Guidelines: A complete evaluation includes B-mode imaging, spectral Doppler, color Doppler, and power Doppler as needed of all accessible portions of each vessel. Bilateral testing is considered an integral part of a complete examination. Limited examinations for reoccurring indications may be performed as noted.  Right Findings: +----------+------------+---------+-----------+----------+-------+ RIGHT      CompressiblePhasicitySpontaneousPropertiesSummary +----------+------------+---------+-----------+----------+-------+ IJV           Full       Yes       Yes                      +----------+------------+---------+-----------+----------+-------+ Subclavian    Full       Yes       Yes                      +----------+------------+---------+-----------+----------+-------+ Axillary      Full       Yes       Yes                      +----------+------------+---------+-----------+----------+-------+ Brachial      Full       Yes       Yes                      +----------+------------+---------+-----------+----------+-------+ Radial        Full                                          +----------+------------+---------+-----------+----------+-------+ Ulnar         Full                                          +----------+------------+---------+-----------+----------+-------+ Cephalic      Full                                          +----------+------------+---------+-----------+----------+-------+ Basilic       Full                                          +----------+------------+---------+-----------+----------+-------+  Left Findings: +----------+------------+---------+-----------+----------+-----------------+ LEFT      CompressiblePhasicitySpontaneousProperties     Summary      +----------+------------+---------+-----------+----------+-----------------+ IJV  Full       Yes       Yes                                +----------+------------+---------+-----------+----------+-----------------+ Subclavian    Full       Yes       Yes                                +----------+------------+---------+-----------+----------+-----------------+ Axillary      Full       Yes       Yes                                +----------+------------+---------+-----------+----------+-----------------+ Brachial      Full       Yes       Yes                                 +----------+------------+---------+-----------+----------+-----------------+ Radial        Full                                                    +----------+------------+---------+-----------+----------+-----------------+ Ulnar         Full                                                    +----------+------------+---------+-----------+----------+-----------------+ Cephalic      None                                  Age Indeterminate +----------+------------+---------+-----------+----------+-----------------+ Basilic       None                                  Age Indeterminate +----------+------------+---------+-----------+----------+-----------------+  Summary:  Right: No evidence of deep vein thrombosis in the upper extremity. No evidence of superficial vein thrombosis in the upper extremity. No evidence of thrombosis in the subclavian.  Left: Findings consistent with age indeterminate superficial vein thrombosis involving the left cephalic vein and left basilic vein.  *See table(s) above for measurements and observations.  Diagnosing physician: Deitra Mayo MD Electronically signed by Deitra Mayo MD on 01/08/2021 at 2:48:33 PM.    Final    [place] EKG SITE RITE  Result Date: 01/08/2021 If Site Rite image not attached, placement could not be confirmed due to current cardiac rhythm.  US Abdomen Limited RUQ (LIVER/GB)  Result Date: 01/08/2021 CLINICAL DATA:  Acalculous cholecystitis, abdominal pain, fever EXAM: ULTRASOUND ABDOMEN LIMITED RIGHT UPPER QUADRANT COMPARISON:  01/08/2021 FINDINGS: Gallbladder: There are no shadowing gallstones. Mild gallbladder wall thickening measuring up to 5 mm. No pericholecystic fluid. Negative sonographic Murphy sign. Common bile duct: Diameter: 4 mm Liver: No focal lesion identified. Within normal limits in parenchymal echogenicity. Portal vein is patent on color Doppler imaging with normal direction  of  blood flow towards the liver. Other: Trace free fluid right upper quadrant. IMPRESSION: 1. Trace free fluid right upper quadrant. 2. Nonspecific gallbladder wall thickening measuring 5 mm. No evidence of gallbladder sludge or cholelithiasis. If acute cholecystitis is a concern, nuclear medicine hepatobiliary scan could be considered. Electronically Signed   By: Randa Ngo M.D.   On: 01/08/2021 20:00    Labs:  CBC: Recent Labs    01/31/21 0520 02/01/21 0445 02/02/21 2147 02/04/21 0313  WBC 10.5 10.8* 7.6 7.3  HGB 7.2* 7.0* 7.0* 6.7*  HCT 23.5* 22.6* 22.4* 21.4*  PLT 152 192 219 262    COAGS: Recent Labs    12/28/20 1607 01/08/21 2042 01/30/21 0521 01/31/21 0520 02/01/21 0445 02/01/21 0738  INR 1.1  --   --   --   --   --   APTT  --    < > 63* 64* 133* 62*   < > = values in this interval not displayed.    BMP: Recent Labs    02/02/21 2147 02/03/21 0339 02/03/21 1600 02/04/21 0313  NA 133* 134* 134* 134*  K 3.1* 3.2* 3.2* 2.9*  CL 95* 93* 94* 91*  CO2 25 25 24 24   GLUCOSE 162* 149* 103* 97  BUN 56* 72* 97* 115*  CALCIUM 8.9 9.1 9.1 9.2  CREATININE 1.84* 2.51* 2.80* 2.87*  GFRNONAA 39* 27* 24* 23*    LIVER FUNCTION TESTS: Recent Labs    12/28/20 1607 01/10/21 1346 01/15/21 0201 02/02/21 2147 02/03/21 0339 02/03/21 1600 02/04/21 0313  BILITOT 0.5 0.4  --   --   --   --   --   AST 33 30  --   --   --   --   --   ALT 27 34  --   --   --   --   --   ALKPHOS 66 53  --   --   --   --   --   PROT 6.5 5.7*  --   --   --   --   --   ALBUMIN 3.5 1.7*   < > 2.4* 2.3* 2.3* 2.2*   < > = values in this interval not displayed.  \  Assessment and Plan:   70 y.o. male inpatient.  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 Hospital course complicated by AKI.Angel [name] is trached and on the vent. Patient has a left  subclavian temp HD catheter placed on the  floor.   Team is requesting a tunneled HD catheter for on going dialysis access.   Hgb 6.7. Has been given 1 unit PRBC. Potassium 2.9, Cr 2.87 BUN 115. GFR 23. On eliquis Lost dose given on 9.18.22. NKDA. Patient has a coretrak. Tube feeds shut off at 9 am. NKDA  Risks and benefits discussed with the patient including, but not limited to bleeding, infection, vascular injury, pneumothorax which may require chest tube placement, air embolism or even death  All of the patient's questions were answered, patient is agreeable to proceed. Consent signed and in chart.   Thank you for this interesting consult.  I greatly enjoyed meeting Angel [name] and look forward to participating in their care.  A copy of this report was sent to the requesting provider on this date.  Electronically Signed: Jacqualine Mau, NP 02/04/2021, 12:54 PM   I spent a total of 40  Minutes    in face to face in clinical consultation, greater than 50% of which was counseling/coordinating care for tunneled HD catheter placement.

## 2021-02-04 NOTE — Progress Notes (Addendum)
Patient ID: Angel [name], male   DOB: [birth year], 70 y.o.   MRN: [id number] 21 Days Post-Op   Subjective: Denies pain ROS negative except as listed above. Objective: Vital signs in last 24 hours: Temp:  [97.8 F (36.6 C)-99.5 F (37.5 C)] 99.5 F (37.5 C) (09/19 0339) Pulse Rate:  [96-122] 115 (09/19 0600) Resp:  [12-27] 18 (09/19 0500) BP: (84-124)/(49-82) 117/57 (09/19 0600) SpO2:  [85 %-100 %] 95 % (09/19 0600) FiO2 (%):  [21 %] 21 % (09/19 0339) Weight:  [108.1 kg] 108.1 kg (09/19 0406) Last BM Date: 02/03/21  Intake/Output from previous day: 09/18 0701 - 09/19 0700 In: 1579.6 [I.V.:199.6; NG/GT:1380] Out: 150 [Stool:150] Intake/Output this shift: No intake/output data recorded.  General appearance: cooperative Resp: clear to auscultation bilaterally Cardio: irregularly irregular rhythm GI: soft, non-tender; bowel sounds normal; no masses,  no organomegaly Extremities: less edema Neuro: F/C  Lab Results: CBC  Recent Labs    02/02/21 2147 02/04/21 0313  WBC 7.6 7.3  HGB 7.0* 6.7*  HCT 22.4* 21.4*  PLT 219 262   BMET Recent Labs    02/03/21 1600 02/04/21 0313  NA 134* 134*  K 3.2* 2.9*  CL 94* 91*  CO2 24 24  GLUCOSE 103* 97  BUN 97* 115*  CREATININE 2.80* 2.87*  CALCIUM 9.1 9.2   PT/INR No results for input(s): LABPROT, INR in the last 72 hours. ABG No results for input(s): PHART, HCO3 in the last 72 hours.  Invalid input(s): PCO2, PO2  Studies/Results: No results found.  Anti-infectives: Anti-infectives (From admission, onward)    Start     Dose/Rate Route Frequency Ordered Stop   01/11/21 2330  ceFEPIme (MAXIPIME) 2 g in sodium chloride 0.9 % 100 mL IVPB  Status:  Discontinued        2 g 200 mL/hr over 30 Minutes Intravenous Every 24 hours 01/11/21 0711 01/16/21 0907   01/10/21 1645  ampicillin (OMNIPEN) 2 g in sodium chloride 0.9 % 100 mL IVPB  Status:  Discontinued        2 g 300 mL/hr over 20 Minutes Intravenous Every 8 hours 01/10/21  1549 01/16/21 0907   01/09/21 2200  ceFEPIme (MAXIPIME) 2 g in sodium chloride 0.9 % 100 mL IVPB  Status:  Discontinued        2 g 200 mL/hr over 30 Minutes Intravenous Every 12 hours 01/09/21 1458 01/11/21 0711   01/08/21 1515  metroNIDAZOLE (FLAGYL) IVPB 500 mg  Status:  Discontinued        500 mg 100 mL/hr over 60 Minutes Intravenous Every 8 hours 01/08/21 1428 01/10/21 1618   01/03/21 0600  vancomycin (VANCOREADY) IVPB 1250 mg/250 mL  Status:  Discontinued        1,250 mg 166.7 mL/hr over 90 Minutes Intravenous Every 12 hours 01/02/21 1717 01/03/21 0837   01/02/21 1800  vancomycin (VANCOREADY) IVPB 2000 mg/400 mL        2,000 mg 200 mL/hr over 120 Minutes Intravenous  Once 01/02/21 1712 01/02/21 2007   01/02/21 0900  ceFEPIme (MAXIPIME) 2 g in sodium chloride 0.9 % 100 mL IVPB  Status:  Discontinued        2 g 200 mL/hr over 30 Minutes Intravenous Every 8 hours 01/02/21 0849 01/09/21 1458       Assessment/Plan: Fall down stairs 8/12   VDRF - guaifenisen, S/P trach 8/29 by Dr. Bobbye Morton. Has tolerated HTC well, passy muir trials. Try downsize trach today to long proximal cuffless #4 ID -  off abx, no fevers, completed maxipime/ampicillin 8/31, CT A/P with ascending colitis and distention. Stool studies and C. dif are all negative. 9/8 Resp cult Pseud - likely colonized.  TBI/SAH/SDH - NSGY c/s, Dr. Annette Stable. Significant frontal lobe injuries. Keppra x7d for sz ppx (completed) Occipital bone fx - NSGY c/s, Dr. Annette Stable Temporal bone fx extending into middle ear - ENT c/s, Dr. Constance Holster Right TM Rupture - ENT c/s, Dr. Constance Holster AFRVR -  amio drip per Cardiology, lopressor added per Cardiology 9/17 ABL anemia - 1u PRBC now Bilateral pulmonary embolism - Eliquis AKI - HD per Renal, will need tunneled HD cath by IR this week - will order  Hx DM2 - resistant SSI, novolog q4, glargine 60u BID Hx HTN - PRN meds FEN - NPO, tolerating TF, ST eval, replete hypokalemia VTE - SCDs, Eliquis Dispo - 4NP, HTC,  therapies  LOS: 35 days    Georganna Skeans, MD, MPH, FACS Trauma & General Surgery Use [email or web address] to contact on call provider  02/04/2021

## 2021-02-04 NOTE — Progress Notes (Signed)
Progress Note  Patient Name: Angel [name] Date of Encounter: 02/04/2021  Primary Cardiologist:   Werner Lean, MD   Subjective   BP improved, 125/72 today.  Hemoglobin down to 6.7 today, given 1 unit PRBCs.  He is alert and answering questions.  Inpatient Medications    Scheduled Meds:  sodium chloride   Intravenous Once   acetaminophen  1,000 mg Per Tube Q6H   apixaban  5 mg Per Tube BID   chlorhexidine  15 mL Mouth Rinse BID   Chlorhexidine Gluconate Cloth  6 each Topical Q0600   docusate  100 mg Per Tube BID   feeding supplement (PROSource TF)  45 mL Per Tube BID   guaiFENesin  10 mL Per Tube Q4H   insulin aspart  0-20 Units Subcutaneous Q4H   insulin aspart  10 Units Subcutaneous Q4H   insulin glargine-yfgn  60 Units Subcutaneous BID   mouth rinse  15 mL Mouth Rinse q12n4p   methocarbamol  1,000 mg Per Tube Q8H   metoprolol tartrate  25 mg Per Tube TID   pantoprazole sodium  40 mg Per Tube Daily   polyethylene glycol  17 g Per Tube Daily   potassium chloride  40 mEq Per Tube BID   QUEtiapine  50 mg Per Tube QHS   senna  1 tablet Per Tube Daily   sodium chloride flush  10-40 mL Intracatheter Q12H   Continuous Infusions:  sodium chloride     sodium chloride     sodium chloride     albumin human 25 g (01/30/21 2058)   albumin human     amiodarone 30 mg/hr (02/04/21 0811)   feeding supplement (NEPRO CARB STEADY) 1,000 mL (02/04/21 0811)   PRN Meds: Place/Maintain arterial line **AND** sodium chloride, sodium chloride, sodium chloride, albumin human, albumin human, alteplase, artificial tears, heparin, hydrALAZINE, HYDROmorphone (DILAUDID) injection, lidocaine (PF), lidocaine-prilocaine, midazolam, ondansetron **OR** ondansetron (ZOFRAN) IV, oxyCODONE, pentafluoroprop-tetrafluoroeth, sodium chloride flush   Vital Signs    Vitals:   02/04/21 0600 02/04/21 0800 02/04/21 0815 02/04/21 0940  BP: (!) 117/57 130/69 (!) 115/97 125/72  Pulse: (!) 115 94 (!) 102  (!) 102  Resp:  16 17 20   Temp:  97.7 F (36.5 C) 98 F (36.7 C)   TempSrc:  Axillary Axillary   SpO2: 95% 97% 96% 97%  Weight:      Height:        Intake/Output Summary (Last 24 hours) at 02/04/2021 1006 Last data filed at 02/04/2021 0811 Gross per 24 hour  Intake 902.97 ml  Output 150 ml  Net 752.97 ml    Filed Weights   02/02/21 1910 02/03/21 0500 02/04/21 0406  Weight: 106.6 kg 107.5 kg 108.1 kg    Telemetry   Atrial fibrillation with rates 90-110s- Personally Reviewed  ECG    No new ECG Personally Reviewed  Physical Exam   GEN: ill appearing, trached HEENT: Normal NECK: difficult to assess JVD due to body habitus.  Trach present CARDIAC: irregular and tachy, no murmurs, rubs, gallops RESPIRATORY:  Clear to auscultation without rales, wheezing or rhonchi  ABDOMEN: Soft, non-tender, non-distended MUSCULOSKELETAL:  No edema SKIN: Warm and dry NEUROLOGIC:  alert, answers questions PSYCHIATRIC: cannot assess  Labs    Chemistry Recent Labs  Lab 02/03/21 0339 02/03/21 1600 02/04/21 0313  NA 134* 134* 134*  K 3.2* 3.2* 2.9*  CL 93* 94* 91*  CO2 25 24 24   GLUCOSE 149* 103* 97  BUN 72* 97* 115*  CREATININE 2.51* 2.80* 2.87*  CALCIUM 9.1 9.1 9.2  ALBUMIN 2.3* 2.3* 2.2*  GFRNONAA 27* 24* 23*  ANIONGAP 16* 16* 19*      Hematology Recent Labs  Lab 02/01/21 0445 02/02/21 2147 02/04/21 0313  WBC 10.8* 7.6 7.3  RBC 2.63* 2.67* 2.53*  HGB 7.0* 7.0* 6.7*  HCT 22.6* 22.4* 21.4*  MCV 85.9 83.9 84.6  MCH 26.6 26.2 26.5  MCHC 31.0 31.3 31.3  RDW 18.0* 18.0* 17.9*  PLT 192 219 262     Cardiac EnzymesNo results for input(s): TROPONINI in the last 168 hours. No results for input(s): TROPIPOC in the last 168 hours.   BNPNo results for input(s): BNP, PROBNP in the last 168 hours.   DDimer No results for input(s): DDIMER in the last 168 hours.   Radiology    No results found.  Cardiac Studies   Echo 01/07/21: 1. Left ventricular ejection  fraction, by estimation, is 60 to 65%. The  left ventricle has normal function. The left ventricle has no regional  wall motion abnormalities. There is mild left ventricular hypertrophy.  Left ventricular diastolic parameters  are indeterminate.   2. Right ventricule is poorly visualized but grossly normal size and  systolic function   3. Left atrial size was mildly dilated.   4. Right atrial size was mildly dilated.   5. The mitral valve is normal in structure. No evidence of mitral valve  regurgitation. No evidence of mitral stenosis.   6. The aortic valve was not well visualized. Aortic valve regurgitation  is not visualized. No aortic stenosis is present.   Patient Profile     70 y.o. male with a PMH of hyperlipidemia, DM type II, who presented with fall resulting in subarachnoid hemorrhage requiring intubation with hospital course complicated by Multi lobar pneumonia, PE, sepsis, AKI requiring CRRT, and new onset atrial fibrillation/flutter for which cardiology is following.  Assessment & Plan    New onset paroxysmal atrial fibrillation  -Atrial fib with rapid rate in the 130's >>he converted to NSR on Amio but now back in afib  -Started on Eliquis, also with bilateral pulmonary embolism.  Hgb trending down slowly, was 6.7 today and given 1 unit PRBCs.  Will need to monitor closely on anticoagulation -continue with IV Amio gtt -Rates appear improved, continue metoprolol 25 mg 3 times daily  Hypoxic respiratory failure:  -Hospital course complicated by multilobar PNA and bilateral PE -Continue vent management per primary team.   AKI:   -Was on CRRT, now HD due to issues with CRRT  HTN -BP normal  -increase metoprolol to 25 mg TID per tube   For questions or updates, please contact [facility] Please consult [email or web address] for contact info under Cardiology/STEMI.   Donato Heinz, MD  02/04/2021, 10:06 AM

## 2021-02-04 NOTE — Progress Notes (Signed)
Nutrition Follow-up  DOCUMENTATION CODES:   Obesity unspecified  INTERVENTION:   Tube feeding via Cortrak tube:   Nepro @ 60 ml/h (1440 ml per day) 45 ml ProSource TF BID  Provides 2592 kcal, 138 gm protein, 1094 ml free water daily   NUTRITION DIAGNOSIS:   Inadequate oral intake related to inability to eat as evidenced by NPO status. Ongoing.   GOAL:   Patient will meet greater than or equal to 90% of their needs Met with TF.   MONITOR:   TF tolerance  REASON FOR ASSESSMENT:   Consult, Ventilator Enteral/tube feeding initiation and management  ASSESSMENT:   Pt with PMH of DM and HTN admitted after falling down basement stairs with TBI/SAH/SDH, significant frontal lobe injuries, occipital bone fx, temporal bone fx extending into middle ear, and R TM rupture.    Pt continues to require iHD. Per Renal, no signs of renal recovery. Tunneled cath placed today. Therapy recommend LTACH, today in rounds discussion of possible CIR. SLP continues to work with pt. Pt has a cortrak in and does not have a PEG. LTACH and CIR do accommodate a Cortrak tube.   Weight down to 238 lb, weight recorded as 286 lb on admission. Pt has been confined in bed until recently, now working with therapy. Pt debilitated from ICU stay.    8/15 s/p cortrak placement; tip gastric 8/25 pt vomited, OG placed with 650 ml out, abd tight  8/26 extubated but required re-intubation 2 hours later; Cortrak coiled in pt's mouth post extubation and removed; NG tube placed with tip in distal stomach  8/27 TF resumed 8/29 s/p trach placement  8/31 cortrak placed; tip gastric  9/1 CRRT started - stopped 9/13 9/14 iHD 9/19 tunneled catheter placed for long term iHD  Medications reviewed and include: colace, SSI, 10 units novolog every 4 hours, 60 units semglee BID, protonix, miralax, senna Amiodarone  Labs reviewed: Na: 134, K: 2.9, BUN 115, Cr: 2.87, PO4 5.7 CBG's: 47-130 (NPO for IR)    UOP: x 4  occurences, volume not measured  I&O: -9 L     Diet Order:   Diet Order             Diet NPO time specified  Diet effective now                   EDUCATION NEEDS:   Not appropriate for education at this time  Skin:  Skin Assessment: Reviewed RN Assessment  Last BM:  150 ml via rectal tube  Height:   Ht Readings from Last 1 Encounters:  01/26/21 6' 2" (1.88 m)    Weight:   Wt Readings from Last 1 Encounters:  02/04/21 108.1 kg    BMI:  Body mass index is 30.6 kg/m.  Estimated Nutritional Needs:   Kcal:  2500-2700  Protein:  130-150 grams  Fluid:  > 2 L/day  Lockie Pares., RD, LDN, CNSC See AMiON for contact information

## 2021-02-04 NOTE — Progress Notes (Signed)
Pt. Was in IR and RT was unable to change trach due to protocol. RT will attempt trach change tomorrow.

## 2021-02-04 NOTE — Progress Notes (Signed)
Physical Therapy Treatment Patient Details Name: Angel [name] MRN: [id number] DOB: [birth year] Today's Date: 02/04/2021   History of Present Illness Angel [name] is a 70 y.o. 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 CT showed R temporal and parietal SAH, SAH anterior frontal lobes  bilaterally. Also sustained right occipital skull fracture, temporal bone fx, right TM rupture, bilateral PE. Intubated 8/12, trach'd 8/29. CRRT 9/1- 9/9.  PMH: DM2, HTN    PT Comments    Pt opened eyes to name and would shake head yes to communicate. Pt remains to be severely deconditioned, L sided more weak than R, non-verbal, poor sequencing and processing, and continues to require maxAx2 for EOB mobility and use of lift equip to transfer OOB to chair. Pt did tolerate EOB sitting x 5 min with maxA. Acute PT to cont to follow.   Recommendations for follow up therapy are one component of a multi-disciplinary discharge planning process, led by the attending physician.  Recommendations may be updated based on patient status, additional functional criteria and insurance authorization.  Follow Up Recommendations  LTACH     Equipment Recommendations  Other (comment)    Recommendations for Other Services       Precautions / Restrictions Precautions Precautions: Fall Precaution Comments: trach collar cortrak watch BP / HR, flexiseal Restrictions Weight Bearing Restrictions: No     Mobility  Bed Mobility Overal bed mobility: Needs Assistance Bed Mobility: Rolling;Supine to Sit Rolling: Max assist;+2 for physical assistance   Supine to sit: Max assist;+2 for physical assistance;HOB elevated     General bed mobility comments: Pt with no inititiation of task at hand despite max repetitive verbal cues, maxA for trunk elevation and LE management    Transfers Overall transfer level: Needs assistance               General transfer comment: use of [place] for OOB to chair. VSS throughout,  sling placed under patient while sitting EOB with assist of tech and RN  Ambulation/Gait             General Gait Details: unable at this time   Stairs             Wheelchair Mobility    Modified Rankin (Stroke Patients Only) Modified Rankin (Stroke Patients Only) Pre-Morbid Rankin Score: No symptoms Modified Rankin: Severe disability     Balance Overall balance assessment: Needs assistance Sitting-balance support: Bilateral upper extremity supported;Feet supported Sitting balance-Leahy Scale: Poor Sitting balance - Comments: requires support of bed in egress position                                    Cognition Arousal/Alertness: Lethargic (but awakes to name, verbal cues to stay awake) Behavior During Therapy: Flat affect Overall Cognitive Status: Impaired/Different from baseline Area of Impairment: Attention;Following commands               Rancho Levels of Cognitive Functioning Rancho Los Amigos Scales of Cognitive Functioning: Confused/inappropriate/non-agitated   Current Attention Level: Focused   Following Commands: Follows one step commands with increased time;Follows one step commands inconsistently       General Comments: follows simple 1 step commands with R hand and LE, no command follow with L UE and LE however will occasional move L UE/LE on his own, difficulty for pt to stay on task,freq v/c's to keep eyes open      Exercises  Other Exercises Other Exercises: completed bilat AA LAQ while at EOB x 10 reps    General Comments General comments (skin integrity, edema, etc.): pt with noted muscle atrophy of L UE and LE      Pertinent Vitals/Pain Pain Assessment: Faces Faces Pain Scale: No hurt    Home Living                      Prior Function            PT Goals (current goals can now be found in the care plan section) Progress towards PT goals: Progressing toward goals    Frequency    Min  3X/week      PT Plan Current plan remains appropriate    Co-evaluation              AM-PAC PT "6 Clicks" Mobility   Outcome Measure  Help needed turning from your back to your side while in a flat bed without using bedrails?: Total Help needed moving from lying on your back to sitting on the side of a flat bed without using bedrails?: Total Help needed moving to and from a bed to a chair (including a wheelchair)?: Total Help needed standing up from a chair using your arms (e.g., wheelchair or bedside chair)?: Total Help needed to walk in hospital room?: Total Help needed climbing 3-5 steps with a railing? : Total 6 Click Score: 6    End of Session   Activity Tolerance: Patient tolerated treatment well Patient left: in chair;with chair alarm set;with call bell/phone within reach;with nursing/sitter in room Nurse Communication: Mobility status PT Visit Diagnosis: Unsteadiness on feet (R26.81);Muscle weakness (generalized) (M62.81);Difficulty in walking, not elsewhere classified (R26.2)     Time: 6773-7366 PT Time Calculation (min) (ACUTE ONLY): 32 min  Charges:  $Therapeutic Exercise: 8-22 mins $Therapeutic Activity: 8-22 mins                     Kittie Plater, PT, DPT Acute Rehabilitation Services Pager #: [phone] Office #: [phone]    Berline Lopes 02/04/2021, 2:04 PM

## 2021-02-05 ENCOUNTER — Encounter (HOSPITAL_COMMUNITY): Payer: Self-pay | Admitting: Radiology

## 2021-02-05 DIAGNOSIS — I2699 Other pulmonary embolism without acute cor pulmonale: Secondary | ICD-10-CM | POA: Diagnosis not present

## 2021-02-05 DIAGNOSIS — J9601 Acute respiratory failure with hypoxia: Secondary | ICD-10-CM

## 2021-02-05 DIAGNOSIS — Z20822 Contact with and (suspected) exposure to covid-19: Secondary | ICD-10-CM | POA: Diagnosis not present

## 2021-02-05 DIAGNOSIS — S066X9A Traumatic subarachnoid hemorrhage with loss of consciousness of unspecified duration, initial encounter: Secondary | ICD-10-CM | POA: Diagnosis not present

## 2021-02-05 DIAGNOSIS — I4891 Unspecified atrial fibrillation: Secondary | ICD-10-CM | POA: Diagnosis not present

## 2021-02-05 DIAGNOSIS — I10 Essential (primary) hypertension: Secondary | ICD-10-CM | POA: Diagnosis not present

## 2021-02-05 LAB — RENAL FUNCTION PANEL
Albumin: 2.5 g/dL — ABNORMAL LOW (ref 3.5–5.0)
Albumin: 2.2 g/dL — ABNORMAL LOW (ref 3.5–5.0)
Anion gap: 18 — ABNORMAL HIGH (ref 5–15)
Anion gap: 15 (ref 5–15)
BUN: 57 mg/dL — ABNORMAL HIGH (ref 8–23)
BUN: 89 mg/dL — ABNORMAL HIGH (ref 8–23)
CO2: 21 mmol/L — ABNORMAL LOW (ref 22–32)
CO2: 23 mmol/L (ref 22–32)
Calcium: 8.8 mg/dL — ABNORMAL LOW (ref 8.9–10.3)
Calcium: 8.9 mg/dL (ref 8.9–10.3)
Chloride: 95 mmol/L — ABNORMAL LOW (ref 98–111)
Chloride: 96 mmol/L — ABNORMAL LOW (ref 98–111)
Creatinine, Ser: 1.71 mg/dL — ABNORMAL HIGH (ref 0.61–1.24)
Creatinine, Ser: 2.41 mg/dL — ABNORMAL HIGH (ref 0.61–1.24)
GFR, Estimated: 43 mL/min — ABNORMAL LOW (ref >60)
GFR, Estimated: 28 mL/min — ABNORMAL LOW (ref >60)
Glucose, Bld: 180 mg/dL — ABNORMAL HIGH (ref 70–99)
Glucose, Bld: 132 mg/dL — ABNORMAL HIGH (ref 70–99)
Phosphorus: 3.4 mg/dL (ref 2.5–4.6)
Phosphorus: 4.8 mg/dL — ABNORMAL HIGH (ref 2.5–4.6)
Potassium: 3.9 mmol/L (ref 3.5–5.1)
Potassium: 3.3 mmol/L — ABNORMAL LOW (ref 3.5–5.1)
Sodium: 134 mmol/L — ABNORMAL LOW (ref 135–145)
Sodium: 134 mmol/L — ABNORMAL LOW (ref 135–145)

## 2021-02-05 LAB — TYPE AND SCREEN
ABO/RH(D): O POS
Antibody Screen: NEGATIVE
Unit division: 0

## 2021-02-05 LAB — GLUCOSE, CAPILLARY
Glucose-Capillary: 103 mg/dL — ABNORMAL HIGH (ref 70–99)
Glucose-Capillary: 116 mg/dL — ABNORMAL HIGH (ref 70–99)
Glucose-Capillary: 122 mg/dL — ABNORMAL HIGH (ref 70–99)
Glucose-Capillary: 136 mg/dL — ABNORMAL HIGH (ref 70–99)
Glucose-Capillary: 145 mg/dL — ABNORMAL HIGH (ref 70–99)
Glucose-Capillary: 183 mg/dL — ABNORMAL HIGH (ref 70–99)

## 2021-02-05 LAB — MAGNESIUM: Magnesium: 2.1 mg/dL (ref 1.7–2.4)

## 2021-02-05 LAB — CBC
HCT: 27.3 % — ABNORMAL LOW (ref 39.0–52.0)
Hemoglobin: 8.7 g/dL — ABNORMAL LOW (ref 13.0–17.0)
MCH: 26.7 pg (ref 26.0–34.0)
MCHC: 31.9 g/dL (ref 30.0–36.0)
MCV: 83.7 fL (ref 80.0–100.0)
Platelets: 323 10*3/uL (ref 150–400)
RBC: 3.26 MIL/uL — ABNORMAL LOW (ref 4.22–5.81)
RDW: 17.7 % — ABNORMAL HIGH (ref 11.5–15.5)
WBC: 6.9 10*3/uL (ref 4.0–10.5)
nRBC: 0 % (ref 0.0–0.2)

## 2021-02-05 LAB — BPAM RBC
Blood Product Expiration Date: 202210162359
ISSUE DATE / TIME: 202209190754
Unit Type and Rh: 5100

## 2021-02-05 MED ORDER — CHLORHEXIDINE GLUCONATE CLOTH 2 % EX PADS: 6.0000 | MEDICATED_PAD | Freq: Every day | CUTANEOUS | Status: DC

## 2021-02-05 NOTE — Progress Notes (Signed)
RT x2 changed Pt's trach to a #6 Shiley XLT Proximal uncuffed. Color change was noted and bilateral breath sounds was noted. Patient is stable and RT will continue to monitor.
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
## 2021-02-05 NOTE — Progress Notes (Signed)
 Progress Note  Patient Name: Angel [name] Date of Encounter: 02/05/2021  Primary Cardiologist:   Mahesh A Chandrasekhar, MD   Subjective   BP 97/60 this morning.  Denies any chest pain or dyspnea  Inpatient Medications    Scheduled Meds:  sodium chloride   Intravenous Once   acetaminophen  1,000 mg Per Tube Q6H   apixaban  5 mg Per Tube BID   chlorhexidine  15 mL Mouth Rinse BID   Chlorhexidine Gluconate Cloth  6 each Topical Q0600   docusate  100 mg Per Tube BID   feeding supplement (PROSource TF)  45 mL Per Tube BID   guaiFENesin  10 mL Per Tube Q4H   insulin aspart  0-20 Units Subcutaneous Q4H   insulin aspart  10 Units Subcutaneous Q4H   insulin glargine-yfgn  60 Units Subcutaneous BID   mouth rinse  15 mL Mouth Rinse q12n4p   methocarbamol  1,000 mg Per Tube Q8H   metoprolol tartrate  25 mg Per Tube TID   pantoprazole sodium  40 mg Per Tube Daily   polyethylene glycol  17 g Per Tube Daily   QUEtiapine  50 mg Per Tube QHS   senna  1 tablet Per Tube Daily   sodium chloride flush  10-40 mL Intracatheter Q12H   Continuous Infusions:  sodium chloride     sodium chloride     sodium chloride     albumin human 25 g (01/30/21 2058)   albumin human     amiodarone 30 mg/hr (02/05/21 0253)   ceFAZolin     dextrose 5% lactated ringers Stopped (02/04/21 1907)   feeding supplement (NEPRO CARB STEADY) 1,000 mL (02/04/21 0811)   PRN Meds: Place/Maintain arterial line **AND** sodium chloride, sodium chloride, sodium chloride, albumin human, albumin human, alteplase, artificial tears, ceFAZolin, fentaNYL, heparin, hydrALAZINE, HYDROmorphone (DILAUDID) injection, lidocaine (PF), lidocaine-prilocaine, midazolam, midazolam, ondansetron **OR** ondansetron (ZOFRAN) IV, oxyCODONE, pentafluoroprop-tetrafluoroeth, sodium chloride flush   Vital Signs    Vitals:   02/05/21 0505 02/05/21 0827 02/05/21 0837 02/05/21 1108  BP:  120/64  97/60  Pulse: (!) 101 (!) 107 (!) 11 92  Resp: 20  19 20 19  Temp:  99.3 F (37.4 C)  98.8 F (37.1 C)  TempSrc:  Oral  Oral  SpO2: 96% 97% 93% 96%  Weight:      Height:        Intake/Output Summary (Last 24 hours) at 02/05/2021 1115 Last data filed at 02/04/2021 2200 Gross per 24 hour  Intake 1393.3 ml  Output 2000 ml  Net -606.7 ml    Filed Weights   02/04/21 0406 02/04/21 1641 02/04/21 2030  Weight: 108.1 kg 100.8 kg 98.8 kg    Telemetry   Atrial fibrillation with rates 90-110s- Personally Reviewed  ECG    AF, rate 107 Personally Reviewed  Physical Exam   GEN: ill appearing, trached HEENT: Normal NECK: difficult to assess JVD due to body habitus.  Trach present CARDIAC: irregular and tachy, no murmurs, rubs, gallops RESPIRATORY:  Clear to auscultation without rales, wheezing or rhonchi  ABDOMEN: Soft, non-tender, non-distended MUSCULOSKELETAL:  No edema SKIN: Warm and dry NEUROLOGIC:  alert, answers questions PSYCHIATRIC: cannot assess  Labs    Chemistry Recent Labs  Lab 02/04/21 0313 02/04/21 1705 02/05/21 0010  NA 134* 135 134*  K 2.9* 2.8* 3.9  CL 91* 95* 95*  CO2 24 25 21*  GLUCOSE 97 113* 180*  BUN 115* 102* 57*  CREATININE 2.87* 2.24* 1.71*    CALCIUM 9.2 8.9 8.8*  ALBUMIN 2.2* 2.3* 2.5*  GFRNONAA 23* 31* 43*  ANIONGAP 19* 15 18*      Hematology Recent Labs  Lab 02/02/21 2147 02/04/21 0313 02/05/21 0010  WBC 7.6 7.3 6.9  RBC 2.67* 2.53* 3.26*  HGB 7.0* 6.7* 8.7*  HCT 22.4* 21.4* 27.3*  MCV 83.9 84.6 83.7  MCH 26.2 26.5 26.7  MCHC 31.3 31.3 31.9  RDW 18.0* 17.9* 17.7*  PLT 219 262 323     Cardiac EnzymesNo results for input(s): TROPONINI in the last 168 hours. No results for input(s): TROPIPOC in the last 168 hours.   BNPNo results for input(s): BNP, PROBNP in the last 168 hours.   DDimer No results for input(s): DDIMER in the last 168 hours.   Radiology    DG Chest 1 View  Result Date: 02/04/2021 CLINICAL DATA:  Encounter for IV line placement. EXAM: CHEST  1 VIEW  COMPARISON:  Radiograph 01/28/2021 FINDINGS: The previous left central line has been removed. There is a new right-sided dual lumen central line with tip overlying the right atrium. Right upper extremity PICC tip in the atrial caval junction. No pneumothorax. Tracheostomy tube and enteric tubes remain in place. Stable cardiomegaly. Stable low lung volumes. Occasional bibasilar atelectasis with slight improvement from prior. No pulmonary edema or pleural effusion. IMPRESSION: 1. New right-sided dual lumen central line with tip overlying the right atrium. No pneumothorax. 2. Right upper extremity PICC tip at the atrial caval junction. 3. Improved bibasilar atelectasis. Electronically Signed   By: Melanie  Sanford M.D.   On: 02/04/2021 22:06   IR US Guide Vasc Access Right  Result Date: 02/04/2021 INDICATION: 70-year-old male status post recent trauma with worsening acute kidney injury requiring long-term hemodialysis. EXAM: TUNNELED CENTRAL VENOUS HEMODIALYSIS CATHETER PLACEMENT WITH ULTRASOUND AND FLUOROSCOPIC GUIDANCE MEDICATIONS: Ancef 2 gm IV . The antibiotic was given in an appropriate time interval prior to skin puncture. ANESTHESIA/SEDATION: Moderate (conscious) sedation was employed during this procedure. A total of Versed 0.5 mg and Fentanyl 25 mcg was administered intravenously. Moderate Sedation Time: 13 minutes. The patient's level of consciousness and vital signs were monitored continuously by radiology nursing throughout the procedure under my direct supervision. FLUOROSCOPY TIME:  0 minutes 6 seconds (3 mGy). COMPLICATIONS: None immediate. PROCEDURE: Informed written consent was obtained from the patient after a discussion of the risks, benefits, and alternatives to treatment. Questions regarding the procedure were encouraged and answered. The right neck and chest were prepped with chlorhexidine in a sterile fashion, and a sterile drape was applied covering the operative field. Maximum barrier  sterile technique with sterile gowns and gloves were used for the procedure. A timeout was performed prior to the initiation of the procedure. After creating a small venotomy incision, a 21 gauge micropuncture kit was utilized to access the internal jugular vein. Real-time ultrasound guidance was utilized for vascular access including the acquisition of a permanent ultrasound image documenting patency of the accessed vessel. A Rosen wire was advanced to the level of the IVC and the micropuncture sheath was exchanged for an 8 Fr dilator. A 14.5 French tunneled hemodialysis catheter measuring 23 cm from tip to cuff was tunneled in a retrograde fashion from the anterior chest wall to the venotomy incision. Serial dilation was then performed an a peel-away sheath was placed. The catheter was then placed through the peel-away sheath with the catheter tip ultimately positioned within the right atrium. Final catheter positioning was confirmed and documented with a spot radiographic image.   The catheter aspirates and flushes normally. The catheter was flushed with appropriate volume heparin dwells. The catheter exit site was secured with a 0-Silk retention suture. The venotomy incision was closed with Dermabond. Sterile dressings were applied. The patient tolerated the procedure well without immediate post procedural complication. IMPRESSION: Successful placement of 23 cm tip to cuff tunneled hemodialysis catheter via the right internal jugular vein with catheter tip terminating within the right atrium. The catheter is ready for immediate use. Dylan Suttle, MD [facility] Electronically Signed   By: Dylan  Suttle M.D.   On: 02/04/2021 16:56    Cardiac Studies   Echo 01/07/21: 1. Left ventricular ejection fraction, by estimation, is 60 to 65%. The  left ventricle has normal function. The left ventricle has no regional  wall motion abnormalities. There is mild  left ventricular hypertrophy.  Left ventricular diastolic parameters  are indeterminate.   2. Right ventricule is poorly visualized but grossly normal size and  systolic function   3. Left atrial size was mildly dilated.   4. Right atrial size was mildly dilated.   5. The mitral valve is normal in structure. No evidence of mitral valve  regurgitation. No evidence of mitral stenosis.   6. The aortic valve was not well visualized. Aortic valve regurgitation  is not visualized. No aortic stenosis is present.   Patient Profile     70 y.o. male with a PMH of hyperlipidemia, DM type II, who presented with fall resulting in subarachnoid hemorrhage requiring intubation with hospital course complicated by Multi lobar pneumonia, PE, sepsis, AKI requiring CRRT, and new onset atrial fibrillation/flutter for which cardiology is following.  Assessment & Plan    New onset paroxysmal atrial fibrillation  -Atrial fib with rapid rate in the 130's >>he converted to NSR on Amio but now back in afib  -Started on Eliquis, also with bilateral pulmonary embolism.  Hgb trending down slowly, was 6.7 on 9/19 and given 1 unit PRBCs.  Will need to monitor closely on anticoagulation -continue with IV Amio gtt -Rates appear improved, continue metoprolol 25 mg 3 times daily  Hypoxic respiratory failure:  -Hospital course complicated by multilobar PNA and bilateral PE -Continue vent management per primary team.   AKI:   -Was on CRRT, now HD due to issues with CRRT  HTN -BP normal  -continue metoprolol to 25 mg TID per tube   For questions or updates, please contact [facility] Please consult [email or web address] for contact info under Cardiology/STEMI.    L , MD  02/05/2021, 11:15 AM    
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
## 2021-02-05 NOTE — Care Management Important Message (Signed)
Important Message  Patient Details  Name: Angel [name] MRN: [id number] Date of Birth: 12/19/1950   Medicare Important Message Given:        Orbie Pyo 02/05/2021, 3:37 PM

## 2021-02-05 NOTE — Progress Notes (Signed)
Patient ID: Angel [name], male   DOB: 03-15-1951, 70 y.o.   MRN: [id number] 22 Days Post-Op   Subjective: Denies pain ROS negative except as listed above. Objective: Vital signs in last 24 hours: Temp:  [98 F (36.7 C)-99.2 F (37.3 C)] 99 F (37.2 C) (09/20 0419) Pulse Rate:  [88-107] 101 (09/20 0505) Resp:  [14-22] 20 (09/20 0505) BP: (102-142)/(58-101) 112/58 (09/20 0419) SpO2:  [93 %-100 %] 96 % (09/20 0505) FiO2 (%):  [21 %] 21 % (09/20 0505) Weight:  [98.8 kg-100.8 kg] 98.8 kg (09/19 2030) Last BM Date:  (flexiseal)  Intake/Output from previous day: 09/19 0701 - 09/20 0700 In: 1983.4 [I.V.:563.4; Blood:360; NG/GT:1060] Out: 2000  Intake/Output this shift: No intake/output data recorded.  General appearance: cooperative Resp: clear to auscultation bilaterally Cardio: irregularly irregular rhythm GI: soft, non-tender; bowel sounds normal; no masses,  no organomegaly Extremities: less edema Neurologic: Mental status: alert, F/C MAE  Lab Results: CBC  Recent Labs    02/04/21 0313 02/05/21 0010  WBC 7.3 6.9  HGB 6.7* 8.7*  HCT 21.4* 27.3*  PLT 262 323   BMET Recent Labs    02/04/21 1705 02/05/21 0010  NA 135 134*  K 2.8* 3.9  CL 95* 95*  CO2 25 21*  GLUCOSE 113* 180*  BUN 102* 57*  CREATININE 2.24* 1.71*  CALCIUM 8.9 8.8*   PT/INR No results for input(s): LABPROT, INR in the last 72 hours. ABG No results for input(s): PHART, HCO3 in the last 72 hours.  Invalid input(s): PCO2, PO2  Studies/Results: DG Chest 1 View  Result Date: 02/04/2021 CLINICAL DATA:  Encounter for IV line placement. EXAM: CHEST  1 VIEW COMPARISON:  Radiograph 01/28/2021 FINDINGS: The previous left central line has been removed. There is a new right-sided dual lumen central line with tip overlying the right atrium. Right upper extremity PICC tip in the atrial caval junction. No pneumothorax. Tracheostomy tube and enteric tubes remain in place. Stable cardiomegaly. Stable low lung  volumes. Occasional bibasilar atelectasis with slight improvement from prior. No pulmonary edema or pleural effusion. IMPRESSION: 1. New right-sided dual lumen central line with tip overlying the right atrium. No pneumothorax. 2. Right upper extremity PICC tip at the atrial caval junction. 3. Improved bibasilar atelectasis. Electronically Signed   By: Keith Rake M.D.   On: 02/04/2021 22:06   IR US Guide Vasc Access Right  Result Date: 02/04/2021 INDICATION: 70 year old male status post recent trauma with worsening acute kidney injury requiring long-term hemodialysis. EXAM: TUNNELED CENTRAL VENOUS HEMODIALYSIS CATHETER PLACEMENT WITH ULTRASOUND AND FLUOROSCOPIC GUIDANCE MEDICATIONS: Ancef 2 gm IV . The antibiotic was given in an appropriate time interval prior to skin puncture. ANESTHESIA/SEDATION: Moderate (conscious) sedation was employed during this procedure. A total of Versed 0.5 mg and Fentanyl 25 mcg was administered intravenously. Moderate Sedation Time: 13 minutes. The patient's level of consciousness and vital signs were monitored continuously by radiology nursing throughout the procedure under my direct supervision. FLUOROSCOPY TIME:  0 minutes 6 seconds (3 mGy). COMPLICATIONS: None immediate. PROCEDURE: Informed written consent was obtained from the patient after a discussion of the risks, benefits, and alternatives to treatment. Questions regarding the procedure were encouraged and answered. The right neck and chest were prepped with chlorhexidine in a sterile fashion, and a sterile drape was applied covering the operative field. Maximum barrier sterile technique with sterile gowns and gloves were used for the procedure. A timeout was performed prior to the initiation of the procedure. After creating a small  venotomy incision, a 21 gauge micropuncture kit was utilized to access the internal jugular vein. Real-time ultrasound guidance was utilized for vascular access including the acquisition of  a permanent ultrasound image documenting patency of the accessed vessel. A Rosen wire was advanced to the level of the IVC and the micropuncture sheath was exchanged for an 8 Fr dilator. A 14.5 French tunneled hemodialysis catheter measuring 23 cm from tip to cuff was tunneled in a retrograde fashion from the anterior chest wall to the venotomy incision. Serial dilation was then performed an a peel-away sheath was placed. The catheter was then placed through the peel-away sheath with the catheter tip ultimately positioned within the right atrium. Final catheter positioning was confirmed and documented with a spot radiographic image. The catheter aspirates and flushes normally. The catheter was flushed with appropriate volume heparin dwells. The catheter exit site was secured with a 0-Silk retention suture. The venotomy incision was closed with Dermabond. Sterile dressings were applied. The patient tolerated the procedure well without immediate post procedural complication. IMPRESSION: Successful placement of 23 cm tip to cuff tunneled hemodialysis catheter via the right internal jugular vein with catheter tip terminating within the right atrium. The catheter is ready for immediate use. Ruthann Cancer, MD [facility] Radiology Electronically Signed   By: Ruthann Cancer M.D.   On: 02/04/2021 16:56    Anti-infectives: Anti-infectives (From admission, onward)    Start     Dose/Rate Route Frequency Ordered Stop   02/04/21 1548  ceFAZolin (ANCEF) IVPB 2g/100 mL premix        over 30 Minutes  Continuous PRN 02/04/21 1549     02/04/21 1545  ceFAZolin (ANCEF) IVPB 1 g/50 mL premix        1 g 100 mL/hr over 30 Minutes Intravenous  Once 02/04/21 1458 02/04/21 1600   02/04/21 1543  ceFAZolin (ANCEF) 2-4 GM/100ML-% IVPB       Note to Pharmacy: [place]   : cabinet override      02/04/21 1543 02/04/21 1651   01/11/21 2330  ceFEPIme (MAXIPIME) 2 g in sodium chloride  0.9 % 100 mL IVPB  Status:  Discontinued        2 g 200 mL/hr over 30 Minutes Intravenous Every 24 hours 01/11/21 0711 01/16/21 0907   01/10/21 1645  ampicillin (OMNIPEN) 2 g in sodium chloride 0.9 % 100 mL IVPB  Status:  Discontinued        2 g 300 mL/hr over 20 Minutes Intravenous Every 8 hours 01/10/21 1549 01/16/21 0907   01/09/21 2200  ceFEPIme (MAXIPIME) 2 g in sodium chloride 0.9 % 100 mL IVPB  Status:  Discontinued        2 g 200 mL/hr over 30 Minutes Intravenous Every 12 hours 01/09/21 1458 01/11/21 0711   01/08/21 1515  metroNIDAZOLE (FLAGYL) IVPB 500 mg  Status:  Discontinued        500 mg 100 mL/hr over 60 Minutes Intravenous Every 8 hours 01/08/21 1428 01/10/21 1618   01/03/21 0600  vancomycin (VANCOREADY) IVPB 1250 mg/250 mL  Status:  Discontinued        1,250 mg 166.7 mL/hr over 90 Minutes Intravenous Every 12 hours 01/02/21 1717 01/03/21 0837   01/02/21 1800  vancomycin (VANCOREADY) IVPB 2000 mg/400 mL        2,000 mg 200 mL/hr over 120 Minutes Intravenous  Once 01/02/21 1712 01/02/21 2007   01/02/21 0900  ceFEPIme (MAXIPIME) 2 g in sodium chloride 0.9 %  100 mL IVPB  Status:  Discontinued        2 g 200 mL/hr over 30 Minutes Intravenous Every 8 hours 01/02/21 0849 01/09/21 1458       Assessment/Plan: Fall down stairs 8/12   VDRF - guaifenisen, S/P trach 8/29 by Dr. Bobbye Morton. Has tolerated HTC well, passy muir trials. Try downsize trach today to long proximal cuffless #4 ID - off abx, no fevers TBI/SAH/SDH - NSGY c/s, Dr. Annette Stable. Significant frontal lobe injuries. Keppra x7d for sz ppx (completed) Occipital bone fx - NSGY c/s, Dr. Annette Stable Temporal bone fx extending into middle ear - ENT c/s, Dr. Constance Holster Right TM Rupture - ENT c/s, Dr. Constance Holster AFRVR -  amio drip per Cardiology, lopressor added per Cardiology 9/17 ABL anemia - 1u PRBC 9/19, Hb 8.7 Bilateral pulmonary embolism - Eliquis AKI - HD per Renal, IR placed tunneled HD cath 9/19 Hx DM2 - resistant SSI, novolog q4,  glargine 60u BID Hx HTN - PRN meds FEN - NPO, tolerating TF, ST eval, replete hypokalemia VTE - SCDs, Eliquis Dispo - 4NP, HTC, therapies  LOS: 58 days    Georganna Skeans, MD, MPH, FACS [facility] Use [email or web address] to contact on call provider  02/05/2021

## 2021-02-05 NOTE — Progress Notes (Signed)
[facility] ROUNDING NOTE   Subjective:   Interval History: This is a 70 year old gentleman who presented 12/28/2020 with a traumatic brain injury subarachnoid hemorrhage subdural hemorrhage temporal bone fracture extending into the middle area occipital bone fracture and right tympanic membrane rupture.  Hospital course was complicated by pulmonary embolus 01/09/2021.  He had acute kidney injury and was started on CVVHD 01/18/2021.  He was transitioned to acute intermittent hemodialysis.  He does not appear to have recovery of renal function.  His last dialysis was 02/04/2021 with 2 L removed  Blood pressure was 97/60 pulse 97 temperature 98.8 O2 sats 96% trach  Sodium 134 potassium 3.9 chloride 95 CO2 21 BUN 57 creatinine 1.7 glucose 180 calcium 8.8 phosphorus 3.4 albumin 2.5 hemoglobin 8.7  Objective:  Vital signs in last 24 hours:  Temp:  [98.2 F (36.8 C)-99.3 F (37.4 C)] 98.8 F (37.1 C) (09/20 1108) Pulse Rate:  [11-107] 92 (09/20 1108) Resp:  [14-22] 19 (09/20 1108) BP: (97-142)/(58-101) 97/60 (09/20 1108) SpO2:  [93 %-100 %] 96 % (09/20 1108) FiO2 (%):  [21 %] 21 % (09/20 0837) Weight:  [98.8 kg-100.8 kg] 98.8 kg (09/19 2030)  Weight change: -7.3 kg Filed Weights   02/04/21 0406 02/04/21 1641 02/04/21 2030  Weight: 108.1 kg 100.8 kg 98.8 kg    Intake/Output: I/O last 3 completed shifts: In: 1983.4 [I.V.:563.4; Blood:360; NG/GT:1060] Out: 2150 [Other:2000; Stool:150]   Intake/Output this shift:  No intake/output data recorded.  General: nad, tracheostomy Heart: rrr Lungs: trach, cta bl Abdomen:soft, nontender. Extremities: trace dependent edema. Neurology:Alert awake  Dialysis Access: Left subclavian temporary HD catheter placed on 9/7   Basic Metabolic Panel: Recent Labs  Lab 02/01/21 0445 02/01/21 1829 02/02/21 0353 02/02/21 2147 02/03/21 0339 02/03/21 1600 02/04/21 0313 02/04/21 1705 02/05/21 0010  NA 133*   < > 133*   < > 134* 134*  134* 135 134*  K 4.3   < > 3.8   < > 3.2* 3.2* 2.9* 2.8* 3.9  CL 93*   < > 91*   < > 93* 94* 91* 95* 95*  CO2 22   < > 22   < > 25 24 24 25  21*  GLUCOSE 172*   < > 193*   < > 149* 103* 97 113* 180*  BUN 134*   < > 152*   < > 72* 97* 115* 102* 57*  CREATININE 3.74*   < > 3.80*   < > 2.51* 2.80* 2.87* 2.24* 1.71*  CALCIUM 10.0   < > 9.3   < > 9.1 9.1 9.2 8.9 8.8*  MG 2.9*  --  2.9*  --  2.4  --  2.7*  --  2.1  PHOS 7.6*   < > 8.4*   < > 4.8* 5.8* 5.7* 4.9* 3.4   < > = values in this interval not displayed.     Liver Function Tests: Recent Labs  Lab 02/03/21 0339 02/03/21 1600 02/04/21 0313 02/04/21 1705 02/05/21 0010  ALBUMIN 2.3* 2.3* 2.2* 2.3* 2.5*    No results for input(s): LIPASE, AMYLASE in the last 168 hours. No results for input(s): AMMONIA in the last 168 hours.  CBC: Recent Labs  Lab 01/31/21 0520 02/01/21 0445 02/02/21 2147 02/04/21 0313 02/05/21 0010  WBC 10.5 10.8* 7.6 7.3 6.9  HGB 7.2* 7.0* 7.0* 6.7* 8.7*  HCT 23.5* 22.6* 22.4* 21.4* 27.3*  MCV 85.5 85.9 83.9 84.6 83.7  PLT 152 192 219 262 323     Cardiac  Enzymes: No results for input(s): CKTOTAL, CKMB, CKMBINDEX, TROPONINI in the last 168 hours.  BNP: Invalid input(s): POCBNP  CBG: Recent Labs  Lab 02/04/21 1935 02/04/21 2358 02/05/21 0423 02/05/21 0828 02/05/21 1105  GLUCAP 77 171* 183* 145* 103*     Microbiology: Results for orders placed or performed during the hospital encounter of 12/28/20  Resp Panel by RT-PCR (Flu A&B, Covid) Nasopharyngeal Swab     Status: None   Collection Time: 12/28/20  4:17 PM   Specimen: Nasopharyngeal Swab; Nasopharyngeal(NP) swabs in vial transport medium  Result Value Ref Range Status   SARS Coronavirus 2 by RT PCR NEGATIVE NEGATIVE Final    Comment: (NOTE) SARS-CoV-2 target nucleic acids are NOT DETECTED.  The SARS-CoV-2 RNA is generally detectable in upper respiratory specimens during the acute phase of infection. The lowest concentration of  SARS-CoV-2 viral copies this assay can detect is 138 copies/mL. A negative result does not preclude SARS-Cov-2 infection and should not be used as the sole basis for treatment or other patient management decisions. A negative result may occur with  improper specimen collection/handling, submission of specimen other than nasopharyngeal swab, presence of viral mutation(s) within the areas targeted by this assay, and inadequate number of viral copies(<138 copies/mL). A negative result must be combined with clinical observations, patient history, and epidemiological information. The expected result is Negative.  Fact Sheet for Patients:  EntrepreneurPulse.com.au  Fact Sheet for Healthcare Providers:  IncredibleEmployment.be  This test is no t yet approved or cleared by the [place] FDA and  has been authorized for detection and/or diagnosis of SARS-CoV-2 by FDA under an Emergency Use Authorization (EUA). This EUA will remain  in effect (meaning this test can be used) for the duration of the COVID-19 declaration under Section 564(b)(1) of the Act, 21 U.S.C.section 360bbb-3(b)(1), unless the authorization is terminated  or revoked sooner.       Influenza A by PCR NEGATIVE NEGATIVE Final   Influenza B by PCR NEGATIVE NEGATIVE Final    Comment: (NOTE) The Xpert Xpress SARS-CoV-2/FLU/RSV plus assay is intended as an aid in the diagnosis of influenza from Nasopharyngeal swab specimens and should not be used as a sole basis for treatment. Nasal washings and aspirates are unacceptable for Xpert Xpress SARS-CoV-2/FLU/RSV testing.  Fact Sheet for Patients: EntrepreneurPulse.com.au  Fact Sheet for Healthcare Providers: IncredibleEmployment.be  This test is not yet approved or cleared by the [place] FDA and has been authorized for detection and/or diagnosis of SARS-CoV-2 by FDA under an Emergency Use  Authorization (EUA). This EUA will remain in effect (meaning this test can be used) for the duration of the COVID-19 declaration under Section 564(b)(1) of the Act, 21 U.S.C. section 360bbb-3(b)(1), unless the authorization is terminated or revoked.  Performed at [facility], [place] [address], [place], [place] [address]   MRSA Next Gen by PCR, Nasal     Status: None   Collection Time: 12/28/20  7:32 PM   Specimen: Nasal Mucosa; Nasal Swab  Result Value Ref Range Status   MRSA by PCR Next Gen NOT DETECTED NOT DETECTED Final    Comment: (NOTE) The GeneXpert MRSA Assay (FDA approved for NASAL specimens only), is one component of a comprehensive MRSA colonization surveillance program. It is not intended to diagnose MRSA infection nor to guide or monitor treatment for MRSA infections. Test performance is not FDA approved in patients less than 37 years old. Performed at [facility], [place] [address] [place], [place] [address]  Culture, Respiratory w Gram Stain     Status: None   Collection Time: 12/31/20 11:06 AM   Specimen: Tracheal Aspirate; Respiratory  Result Value Ref Range Status   Specimen Description TRACHEAL ASPIRATE  Final   Special Requests NONE  Final   Gram Stain   Final    FEW SQUAMOUS EPITHELIAL CELLS PRESENT FEW WBC PRESENT,BOTH PMN AND MONONUCLEAR FEW GRAM POSITIVE COCCI Performed at [facility], [place] [address] [place], [place] [address]    Culture   Final    FEW PSEUDOMONAS AERUGINOSA FEW STREPTOCOCCUS PNEUMONIAE    Report Status 01/03/2021 FINAL  Final   Organism ID, Bacteria PSEUDOMONAS AERUGINOSA  Final   Organism ID, Bacteria STREPTOCOCCUS PNEUMONIAE  Final      Susceptibility   Pseudomonas aeruginosa - MIC*    CEFTAZIDIME 4 SENSITIVE Sensitive     CIPROFLOXACIN <=0.25 SENSITIVE Sensitive     GENTAMICIN <=1 SENSITIVE Sensitive     IMIPENEM 2 SENSITIVE Sensitive     PIP/TAZO 8 SENSITIVE Sensitive     CEFEPIME 2 SENSITIVE Sensitive      * FEW PSEUDOMONAS AERUGINOSA   Streptococcus pneumoniae - MIC*    ERYTHROMYCIN 4 RESISTANT Resistant     LEVOFLOXACIN 0.5 SENSITIVE Sensitive     VANCOMYCIN <=0.12 SENSITIVE Sensitive     PENO - penicillin <=0.06      PENICILLIN (non-meningitis) <=0.06 SENSITIVE Sensitive     PENICILLIN (oral) <=0.06 SENSITIVE Sensitive     CEFTRIAXONE (non-meningitis) <=0.12 SENSITIVE Sensitive     * FEW STREPTOCOCCUS PNEUMONIAE  Culture, blood (routine x 2)     Status: None   Collection Time: 01/05/21 11:44 AM   Specimen: BLOOD  Result Value Ref Range Status   Specimen Description BLOOD SITE NOT SPECIFIED  Final   Special Requests AEROBIC BOTTLE ONLY Blood Culture adequate volume  Final   Culture   Final    NO GROWTH 5 DAYS Performed at [facility], [address] [place], [place] [address]    Report Status 01/10/2021 FINAL  Final  Culture, blood (routine x 2)     Status: None   Collection Time: 01/05/21 11:44 AM   Specimen: BLOOD  Result Value Ref Range Status   Specimen Description BLOOD SITE NOT SPECIFIED  Final   Special Requests   Final    AEROBIC BOTTLE ONLY Blood Culture results may not be optimal due to an inadequate volume of blood received in culture bottles   Culture   Final    NO GROWTH 5 DAYS Performed at [facility], [place] [address], [place], [place] [address]    Report Status 01/10/2021 FINAL  Final  Surgical PCR screen     Status: None   Collection Time: 01/08/21 12:14 AM   Specimen: Nasal Mucosa; Nasal Swab  Result Value Ref Range Status   MRSA, PCR NEGATIVE NEGATIVE Final   Staphylococcus aureus NEGATIVE NEGATIVE Final    Comment: (NOTE) The Xpert SA Assay (FDA approved for NASAL specimens in patients 58 years of age and older), is one component of a comprehensive surveillance program. It is not intended to diagnose infection nor to guide or monitor treatment. Performed at [facility], [place] [address], [place], [place] [address]   Culture,  Respiratory w Gram Stain     Status: None   Collection Time: 01/08/21  1:24 PM   Specimen: Tracheal Aspirate; Respiratory  Result Value Ref Range Status   Specimen Description TRACHEAL ASPIRATE  Final   Special Requests NONE  Final   Gram Stain   Final    RARE SQUAMOUS EPITHELIAL CELLS PRESENT MODERATE WBC PRESENT, PREDOMINANTLY MONONUCLEAR FEW GRAM NEGATIVE RODS Performed at [facility], [place] [address] [place], [place] [address]    Culture   Final    RARE PSEUDOMONAS AERUGINOSA RARE ENTEROCOCCUS FAECALIS    Report Status 01/11/2021 FINAL  Final   Organism ID, Bacteria PSEUDOMONAS AERUGINOSA  Final   Organism ID, Bacteria ENTEROCOCCUS FAECALIS  Final      Susceptibility   Enterococcus faecalis - MIC*    AMPICILLIN <=2 SENSITIVE Sensitive     VANCOMYCIN 1 SENSITIVE Sensitive     GENTAMICIN SYNERGY SENSITIVE Sensitive     * RARE ENTEROCOCCUS FAECALIS   Pseudomonas aeruginosa - MIC*    CEFTAZIDIME 4 SENSITIVE Sensitive     CIPROFLOXACIN <=0.25 SENSITIVE Sensitive     GENTAMICIN <=1 SENSITIVE Sensitive     IMIPENEM 2 SENSITIVE Sensitive     PIP/TAZO 8 SENSITIVE Sensitive     CEFEPIME 2 SENSITIVE Sensitive     * RARE PSEUDOMONAS AERUGINOSA  Gastrointestinal Panel by PCR , Stool     Status: None   Collection Time: 01/08/21  5:04 PM   Specimen: Stool  Result Value Ref Range Status   Campylobacter species NOT DETECTED NOT DETECTED Final   Plesimonas shigelloides NOT DETECTED NOT DETECTED Final   Salmonella species NOT DETECTED NOT DETECTED Final   Yersinia enterocolitica NOT DETECTED NOT DETECTED Final   Vibrio species NOT DETECTED NOT DETECTED Final   Vibrio cholerae NOT DETECTED NOT DETECTED Final   Enteroaggregative E coli (EAEC) NOT DETECTED NOT DETECTED Final   Enteropathogenic E coli (EPEC) NOT DETECTED NOT DETECTED Final   Enterotoxigenic E coli (ETEC) NOT DETECTED NOT DETECTED Final   Shiga like toxin producing E coli (STEC) NOT DETECTED NOT DETECTED Final    Shigella/Enteroinvasive E coli (EIEC) NOT DETECTED NOT DETECTED Final   Cryptosporidium NOT DETECTED NOT DETECTED Final   Cyclospora cayetanensis NOT DETECTED NOT DETECTED Final   Entamoeba histolytica NOT DETECTED NOT DETECTED Final   Giardia lamblia NOT DETECTED NOT DETECTED Final   Adenovirus F40/41 NOT DETECTED NOT DETECTED Final   Astrovirus NOT DETECTED NOT DETECTED Final   Norovirus GI/GII NOT DETECTED NOT DETECTED Final   Rotavirus A NOT DETECTED NOT DETECTED Final   Sapovirus (I, II, IV, and V) NOT DETECTED NOT DETECTED Final    Comment: Performed at [facility], [place] [place], [place] [address]  C Difficile Quick Screen (NO PCR Reflex)     Status: None   Collection Time: 01/09/21 11:07 AM   Specimen: STOOL  Result Value Ref Range Status   C Diff antigen NEGATIVE NEGATIVE Final   C Diff toxin NEGATIVE NEGATIVE Final   C Diff interpretation No C. difficile detected.  Final    Comment: Performed at [facility], [place] [address], [place], [place] [address]  Culture, Respiratory w Gram Stain     Status: None   Collection Time: 01/24/21  8:57 AM   Specimen: Tracheal Aspirate; Respiratory  Result Value Ref Range Status   Specimen Description TRACHEAL ASPIRATE  Final   Special Requests NONE  Final   Gram Stain   Final    ABUNDANT WBC PRESENT, PREDOMINANTLY MONONUCLEAR RARE FEW GRAM NEGATIVE RODS    Culture   Final    ABUNDANT PSEUDOMONAS AERUGINOSA Two isolates with different morphologies were identified as the same  organism.The most resistant organism was reported. Performed at [facility], [place] [address], [place], [place] [address]    Report Status 01/27/2021 FINAL  Final   Organism ID, Bacteria PSEUDOMONAS AERUGINOSA  Final      Susceptibility   Pseudomonas aeruginosa - MIC*    CEFTAZIDIME 16 INTERMEDIATE Intermediate     CIPROFLOXACIN 1 SENSITIVE Sensitive     GENTAMICIN <=1 SENSITIVE Sensitive     IMIPENEM 2 SENSITIVE Sensitive     *  ABUNDANT PSEUDOMONAS AERUGINOSA    Coagulation Studies: No results for input(s): LABPROT, INR in the last 72 hours.  Urinalysis: No results for input(s): COLORURINE, LABSPEC, PHURINE, GLUCOSEU, HGBUR, BILIRUBINUR, KETONESUR, PROTEINUR, UROBILINOGEN, NITRITE, LEUKOCYTESUR in the last 72 hours.  Invalid input(s): APPERANCEUR    Imaging: DG Chest 1 View  Result Date: 02/04/2021 CLINICAL DATA:  Encounter for IV line placement. EXAM: CHEST  1 VIEW COMPARISON:  Radiograph 01/28/2021 FINDINGS: The previous left central line has been removed. There is a new right-sided dual lumen central line with tip overlying the right atrium. Right upper extremity PICC tip in the atrial caval junction. No pneumothorax. Tracheostomy tube and enteric tubes remain in place. Stable cardiomegaly. Stable low lung volumes. Occasional bibasilar atelectasis with slight improvement from prior. No pulmonary edema or pleural effusion. IMPRESSION: 1. New right-sided dual lumen central line with tip overlying the right atrium. No pneumothorax. 2. Right upper extremity PICC tip at the atrial caval junction. 3. Improved bibasilar atelectasis. Electronically Signed   By: Keith Rake M.D.   On: 02/04/2021 22:06   IR US Guide Vasc Access Right  Result Date: 02/04/2021 INDICATION: 70 year old male status post recent trauma with worsening acute kidney injury requiring long-term hemodialysis. EXAM: TUNNELED CENTRAL VENOUS HEMODIALYSIS CATHETER PLACEMENT WITH ULTRASOUND AND FLUOROSCOPIC GUIDANCE MEDICATIONS: Ancef 2 gm IV . The antibiotic was given in an appropriate time interval prior to skin puncture. ANESTHESIA/SEDATION: Moderate (conscious) sedation was employed during this procedure. A total of Versed 0.5 mg and Fentanyl 25 mcg was administered intravenously. Moderate Sedation Time: 13 minutes. The patient's level of consciousness and vital signs were monitored continuously by radiology nursing throughout the procedure under my  direct supervision. FLUOROSCOPY TIME:  0 minutes 6 seconds (3 mGy). COMPLICATIONS: None immediate. PROCEDURE: Informed written consent was obtained from the patient after a discussion of the risks, benefits, and alternatives to treatment. Questions regarding the procedure were encouraged and answered. The right neck and chest were prepped with chlorhexidine in a sterile fashion, and a sterile drape was applied covering the operative field. Maximum barrier sterile technique with sterile gowns and gloves were used for the procedure. A timeout was performed prior to the initiation of the procedure. After creating a small venotomy incision, a 21 gauge micropuncture kit was utilized to access the internal jugular vein. Real-time ultrasound guidance was utilized for vascular access including the acquisition of a permanent ultrasound image documenting patency of the accessed vessel. A Rosen wire was advanced to the level of the IVC and the micropuncture sheath was exchanged for an 8 Fr dilator. A 14.5 French tunneled hemodialysis catheter measuring 23 cm from tip to cuff was tunneled in a retrograde fashion from the anterior chest wall to the venotomy incision. Serial dilation was then performed an a peel-away sheath was placed. The catheter was then placed through the peel-away sheath with the catheter tip ultimately positioned within the right atrium. Final catheter positioning was confirmed and documented with a spot radiographic image. The catheter aspirates  and flushes normally. The catheter was flushed with appropriate volume heparin dwells. The catheter exit site was secured with a 0-Silk retention suture. The venotomy incision was closed with Dermabond. Sterile dressings were applied. The patient tolerated the procedure well without immediate post procedural complication. IMPRESSION: Successful placement of 23 cm tip to cuff tunneled hemodialysis catheter via the right internal jugular vein with catheter tip  terminating within the right atrium. The catheter is ready for immediate use. Ruthann Cancer, MD [facility] Radiology Electronically Signed   By: Ruthann Cancer M.D.   On: 02/04/2021 16:56     Medications:    sodium chloride     sodium chloride     sodium chloride     albumin human 25 g (01/30/21 2058)   albumin human     amiodarone 30 mg/hr (02/05/21 0253)   ceFAZolin     dextrose 5% lactated ringers Stopped (02/04/21 1907)   feeding supplement (NEPRO CARB STEADY) 1,000 mL (02/04/21 0811)    sodium chloride   Intravenous Once   acetaminophen  1,000 mg Per Tube Q6H   apixaban  5 mg Per Tube BID   chlorhexidine  15 mL Mouth Rinse BID   Chlorhexidine Gluconate Cloth  6 each Topical Q0600   docusate  100 mg Per Tube BID   feeding supplement (PROSource TF)  45 mL Per Tube BID   guaiFENesin  10 mL Per Tube Q4H   insulin aspart  0-20 Units Subcutaneous Q4H   insulin aspart  10 Units Subcutaneous Q4H   insulin glargine-yfgn  60 Units Subcutaneous BID   mouth rinse  15 mL Mouth Rinse q12n4p   methocarbamol  1,000 mg Per Tube Q8H   metoprolol tartrate  25 mg Per Tube TID   pantoprazole sodium  40 mg Per Tube Daily   polyethylene glycol  17 g Per Tube Daily   QUEtiapine  50 mg Per Tube QHS   senna  1 tablet Per Tube Daily   sodium chloride flush  10-40 mL Intracatheter Q12H   Place/Maintain arterial line **AND** sodium chloride, sodium chloride, sodium chloride, albumin human, albumin human, alteplase, artificial tears, ceFAZolin, fentaNYL, heparin, hydrALAZINE, HYDROmorphone (DILAUDID) injection, lidocaine (PF), lidocaine-prilocaine, midazolam, midazolam, ondansetron **OR** ondansetron (ZOFRAN) IV, oxyCODONE, pentafluoroprop-tetrafluoroeth, sodium chloride flush  Assessment/ Plan:   #Acute kidney injury, oliguric: Multifactorial etiology including ischemic ATN in the setting of hypotension, sepsis complicated by contrast injury. CRRT from  9/1-9/9.  No heparin as he is on bivalirudin.  The HD catheter was changed on 9/7.  CRRT restarted on 9/11 given elevated BUN and with more confusion, did not tolerated IHD on 9/10 (confusion, hypotension). CRRT clotted off on 9/13. Did relatively okay with HD 9/14-9/15 overnight -no signs of renal recovery yet -HD 02/04/2021 will attempt to maintain him on a MWF schedule.  Next dialysis will be 02/06/2021 -Will likely need his catheter to be tunneled as there are no signs of renal recovery as of yet, nonurgent, can consult IR early in the week -Continue to monitor strict ins and out, daily labs.   #Fall/bilateral subarachnoid hemorrhage/SDH, TBI/occipital and temporal bone fracture: Per trauma team.  Repeat CT scan with no acute finding.   #Acute respiratory failure: Status post trach on 8/29, trach collar   #A. fib with RVR: On amiodarone, Angiomax.   # Anemia of critical illness: Transfuse as needed.   #Metabolic acidosis: Managed with dialysis. resolved   #Bilateral pulm embolism: Currently on anticoagulation.   #Acute  febrile illness: Per primary team.   #Hyperkalemia:  K wnl now, managing with HD    LOS: [place] @TODAY @11 :16 AM

## 2021-02-05 NOTE — Progress Notes (Signed)
Speech Language Pathology Treatment: Cognitive-Linquistic;Passy Muir Speaking valve  Patient Details Name: Angel [name] MRN: [id number] DOB: [birth year] Today's Date: 02/05/2021 Time: 6237-6283 SLP Time Calculation (min) (ACUTE ONLY): 17 min  Assessment / Plan / Recommendation Clinical Impression  Pt was seen for PMV/cognitive therapy targeting voice quality/intelligibility, orientation, awareness, and problem solving. SLP donned PMV with following sats: respiratory rate 19, spO2 96%, HR 110, with no changes from before to after valve placement. Pt oriented to person but not oriented to time, place, or situation and exhibiting confusion when asked about family members, stating he had no wife and no children. Pt vocal quality with PMV hoarse but vocal intensity improving, requiring one verbal cue intermittently to repeat utterances at a louder volume. SLP cued pt to problem solve by finding date in room; pt unable to find calendar in room but read calendar accurately once presented in front of him. SLP informally assessed short term memory by educating pt about situation and then asking for recall at end of session; pt required 3 verbal cues to accurately recall situation. SLP will continue to follow for PMV/cognitive therapy targeting speech intelligibility, problem solving, orientation, and awareness.   HPI HPI: Angel [name] is a 70 y.o. 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 CT showed R temporal and parietal SAH, SAH anterior frontal lobes  bilaterally. Also sustained right occipital skull fracture, temporal bone fx, right TM rupture, bilateral PE. Intubated 8/12, trach'd 8/29. PMH: DM2, HTN      SLP Plan  Continue with current plan of care      Recommendations for follow up therapy are one component of a multi-disciplinary discharge planning process, led by the attending physician.  Recommendations may be updated based on patient status, additional functional criteria and insurance  authorization.    Recommendations  Medication Administration: Via alternative means      Patient may use Passy-Muir Speech Valve: During all therapies with supervision PMSV Supervision: Full MD: Please consider changing trach tube to : Cuffless         Oral Care Recommendations: Oral care QID Follow up Recommendations: LTACH SLP Visit Diagnosis: Aphonia (R49.1);Cognitive communication deficit [phone]) Plan: Continue with current plan of care       GO             Dewitt Rota, SLP-Student    Dewitt Rota  02/05/2021, 11:51 AM

## 2021-02-06 DIAGNOSIS — I2699 Other pulmonary embolism without acute cor pulmonale: Secondary | ICD-10-CM | POA: Diagnosis not present

## 2021-02-06 DIAGNOSIS — S066X9A Traumatic subarachnoid hemorrhage with loss of consciousness of unspecified duration, initial encounter: Secondary | ICD-10-CM | POA: Diagnosis not present

## 2021-02-06 DIAGNOSIS — I4891 Unspecified atrial fibrillation: Secondary | ICD-10-CM | POA: Diagnosis not present

## 2021-02-06 DIAGNOSIS — J9601 Acute respiratory failure with hypoxia: Secondary | ICD-10-CM | POA: Diagnosis not present

## 2021-02-06 DIAGNOSIS — R9431 Abnormal electrocardiogram [ECG] [EKG]: Secondary | ICD-10-CM | POA: Diagnosis not present

## 2021-02-06 DIAGNOSIS — Z20822 Contact with and (suspected) exposure to covid-19: Secondary | ICD-10-CM | POA: Diagnosis not present

## 2021-02-06 LAB — RENAL FUNCTION PANEL
Albumin: 2.2 g/dL — ABNORMAL LOW (ref 3.5–5.0)
Albumin: 2.7 g/dL — ABNORMAL LOW (ref 3.5–5.0)
Anion gap: 14 (ref 5–15)
Anion gap: 11 (ref 5–15)
BUN: 108 mg/dL — ABNORMAL HIGH (ref 8–23)
BUN: 48 mg/dL — ABNORMAL HIGH (ref 8–23)
CO2: 22 mmol/L (ref 22–32)
CO2: 26 mmol/L (ref 22–32)
Calcium: 8.9 mg/dL (ref 8.9–10.3)
Calcium: 8.7 mg/dL — ABNORMAL LOW (ref 8.9–10.3)
Chloride: 96 mmol/L — ABNORMAL LOW (ref 98–111)
Chloride: 97 mmol/L — ABNORMAL LOW (ref 98–111)
Creatinine, Ser: 2.44 mg/dL — ABNORMAL HIGH (ref 0.61–1.24)
Creatinine, Ser: 1.5 mg/dL — ABNORMAL HIGH (ref 0.61–1.24)
GFR, Estimated: 28 mL/min — ABNORMAL LOW (ref >60)
GFR, Estimated: 50 mL/min — ABNORMAL LOW (ref >60)
Glucose, Bld: 153 mg/dL — ABNORMAL HIGH (ref 70–99)
Glucose, Bld: 128 mg/dL — ABNORMAL HIGH (ref 70–99)
Phosphorus: 5.2 mg/dL — ABNORMAL HIGH (ref 2.5–4.6)
Phosphorus: 2.6 mg/dL (ref 2.5–4.6)
Potassium: 3.1 mmol/L — ABNORMAL LOW (ref 3.5–5.1)
Potassium: 3.8 mmol/L (ref 3.5–5.1)
Sodium: 132 mmol/L — ABNORMAL LOW (ref 135–145)
Sodium: 134 mmol/L — ABNORMAL LOW (ref 135–145)

## 2021-02-06 LAB — GLUCOSE, CAPILLARY
Glucose-Capillary: 135 mg/dL — ABNORMAL HIGH (ref 70–99)
Glucose-Capillary: 125 mg/dL — ABNORMAL HIGH (ref 70–99)
Glucose-Capillary: 132 mg/dL — ABNORMAL HIGH (ref 70–99)
Glucose-Capillary: 131 mg/dL — ABNORMAL HIGH (ref 70–99)
Glucose-Capillary: 74 mg/dL (ref 70–99)
Glucose-Capillary: 96 mg/dL (ref 70–99)
Glucose-Capillary: 119 mg/dL — ABNORMAL HIGH (ref 70–99)

## 2021-02-06 LAB — CBC
HCT: 25.4 % — ABNORMAL LOW (ref 39.0–52.0)
Hemoglobin: 7.7 g/dL — ABNORMAL LOW (ref 13.0–17.0)
MCH: 26.1 pg (ref 26.0–34.0)
MCHC: 30.3 g/dL (ref 30.0–36.0)
MCV: 86.1 fL (ref 80.0–100.0)
Platelets: 356 10*3/uL (ref 150–400)
RBC: 2.95 MIL/uL — ABNORMAL LOW (ref 4.22–5.81)
RDW: 17.7 % — ABNORMAL HIGH (ref 11.5–15.5)
WBC: 6.6 10*3/uL (ref 4.0–10.5)
nRBC: 0 % (ref 0.0–0.2)

## 2021-02-06 LAB — MAGNESIUM: Magnesium: 2.4 mg/dL (ref 1.7–2.4)

## 2021-02-06 MED ORDER — ALTEPLASE 2 MG IJ SOLR: 2.0000 mg | Freq: Once | INTRAMUSCULAR | Status: DC | PRN

## 2021-02-06 MED ORDER — LIDOCAINE-PRILOCAINE 2.5-2.5 % EX CREA: 1.0000 "application " | TOPICAL_CREAM | CUTANEOUS | Status: DC | PRN

## 2021-02-06 MED ORDER — AMIODARONE HCL 200 MG PO TABS: 200.0000 mg | ORAL_TABLET | Freq: Every day | ORAL | Status: DC

## 2021-02-06 MED ORDER — AMIODARONE HCL 200 MG PO TABS
200.0000 mg | ORAL_TABLET | Freq: Every day | ORAL | Status: DC
  Administered 2021-02-06 – 2021-02-07 (×2): 200 mg
  Filled 2021-02-06 (×2): qty 1

## 2021-02-06 MED ORDER — SODIUM CHLORIDE 0.9 % IV SOLN: 100.0000 mL | INTRAVENOUS | Status: DC | PRN

## 2021-02-06 MED ORDER — HEPARIN SODIUM (PORCINE) 1000 UNIT/ML DIALYSIS
1000.0000 [IU] | INTRAMUSCULAR | Status: DC | PRN
  Filled 2021-02-06: qty 1

## 2021-02-06 MED ORDER — QUETIAPINE FUMARATE 50 MG PO TABS
25.0000 mg | ORAL_TABLET | Freq: Every day | ORAL | Status: DC
  Administered 2021-02-06 – 2021-02-11 (×6): 25 mg
  Filled 2021-02-06 (×7): qty 1

## 2021-02-06 MED ORDER — POTASSIUM CHLORIDE 20 MEQ PO PACK
40.0000 meq | PACK | Freq: Once | ORAL | Status: AC
  Administered 2021-02-06: 40 meq
  Filled 2021-02-06: qty 2

## 2021-02-06 MED ORDER — INSULIN GLARGINE-YFGN 100 UNIT/ML ~~LOC~~ SOLN
30.0000 [IU] | Freq: Once | SUBCUTANEOUS | Status: AC
  Administered 2021-02-06: 30 [IU] via SUBCUTANEOUS
  Filled 2021-02-06: qty 0.3

## 2021-02-06 MED ORDER — LIDOCAINE HCL (PF) 1 % IJ SOLN: 5.0000 mL | INTRAMUSCULAR | Status: DC | PRN

## 2021-02-06 MED ORDER — METOPROLOL TARTRATE 25 MG/10 ML ORAL SUSPENSION
25.0000 mg | Freq: Two times a day (BID) | ORAL | Status: DC
  Administered 2021-02-06 – 2021-02-07 (×2): 25 mg
  Filled 2021-02-06 (×2): qty 10

## 2021-02-06 MED ORDER — PENTAFLUOROPROP-TETRAFLUOROETH EX AERO: 1.0000 "application " | INHALATION_SPRAY | CUTANEOUS | Status: DC | PRN

## 2021-02-06 MED ORDER — INSULIN GLARGINE-YFGN 100 UNIT/ML ~~LOC~~ SOLN
60.0000 [IU] | Freq: Two times a day (BID) | SUBCUTANEOUS | Status: DC
  Administered 2021-02-07 – 2021-02-12 (×12): 60 [IU] via SUBCUTANEOUS
  Filled 2021-02-06 (×14): qty 0.6

## 2021-02-06 NOTE — Progress Notes (Signed)
[facility] ROUNDING NOTE   Subjective:   Interval History: This is a 70 year old gentleman who presented 12/28/2020 with a traumatic brain injury subarachnoid hemorrhage subdural hemorrhage temporal bone fracture extending into the middle area occipital bone fracture and right tympanic membrane rupture.  Hospital course was complicated by pulmonary embolus 01/09/2021.  He had acute kidney injury and was started on CVVHD 01/18/2021.  He was transitioned to acute intermittent hemodialysis.  He does not appear to have recovery of renal function.  His last dialysis was 02/04/2021 with 2 L removed  Blood pressure 103/62 pulse 81 temperature 99 O2 sats 93% FiO2 21%  Sodium 132 potassium 3.1 chloride 96 CO2 22 BUN 108 creatinine 2.4 glucose 153 calcium 8.9 phosphorus 5.2 albumin 2.2 hemoglobin 7.7  Objective:  Vital signs in last 24 hours:  Temp:  [98.8 F (37.1 C)-99.4 F (37.4 C)] 99.1 F (37.3 C) (09/21 0408) Pulse Rate:  [11-124] 80 (09/21 0505) Resp:  [14-20] 16 (09/21 0505) BP: (89-120)/(54-66) 101/54 (09/21 0408) SpO2:  [93 %-99 %] 94 % (09/21 0505) FiO2 (%):  [21 %] 21 % (09/21 0505) Weight:  [106.4 kg] 106.4 kg (09/21 0428)  Weight change: 5.6 kg Filed Weights   02/04/21 1641 02/04/21 2030 02/06/21 0428  Weight: 100.8 kg 98.8 kg 106.4 kg    Intake/Output: I/O last 3 completed shifts: In: 665 [I.V.:395; NG/GT:270] Out: 2000 [Other:2000]   Intake/Output this shift:  No intake/output data recorded.  General: nad, tracheostomy Heart: rrr Lungs: trach, cta bl Abdomen:soft, nontender. Extremities: trace dependent edema. Neurology:Alert awake  Dialysis Access: Left subclavian temporary HD catheter placed on 9/7   Basic Metabolic Panel: Recent Labs  Lab 02/02/21 0353 02/02/21 2147 02/03/21 0339 02/03/21 1600 02/04/21 0313 02/04/21 1705 02/05/21 0010 02/05/21 1550 02/06/21 0426 02/06/21 0427  NA 133*   < > 134*   < > 134* 135 134* 134*  --  132*  K 3.8    < > 3.2*   < > 2.9* 2.8* 3.9 3.3*  --  3.1*  CL 91*   < > 93*   < > 91* 95* 95* 96*  --  96*  CO2 22   < > 25   < > 24 25 21* 23  --  22  GLUCOSE 193*   < > 149*   < > 97 113* 180* 132*  --  153*  BUN 152*   < > 72*   < > 115* 102* 57* 89*  --  108*  CREATININE 3.80*   < > 2.51*   < > 2.87* 2.24* 1.71* 2.41*  --  2.44*  CALCIUM 9.3   < > 9.1   < > 9.2 8.9 8.8* 8.9  --  8.9  MG 2.9*  --  2.4  --  2.7*  --  2.1  --  2.4  --   PHOS 8.4*   < > 4.8*   < > 5.7* 4.9* 3.4 4.8*  --  5.2*   < > = values in this interval not displayed.     Liver Function Tests: Recent Labs  Lab 02/04/21 0313 02/04/21 1705 02/05/21 0010 02/05/21 1550 02/06/21 0427  ALBUMIN 2.2* 2.3* 2.5* 2.2* 2.2*    No results for input(s): LIPASE, AMYLASE in the last 168 hours. No results for input(s): AMMONIA in the last 168 hours.  CBC: Recent Labs  Lab 02/01/21 0445 02/02/21 2147 02/04/21 0313 02/05/21 0010 02/06/21 0426  WBC 10.8* 7.6 7.3 6.9 6.6  HGB 7.0* 7.0*  6.7* 8.7* 7.7*  HCT 22.6* 22.4* 21.4* 27.3* 25.4*  MCV 85.9 83.9 84.6 83.7 86.1  PLT 192 219 262 323 356     Cardiac Enzymes: No results for input(s): CKTOTAL, CKMB, CKMBINDEX, TROPONINI in the last 168 hours.  BNP: Invalid input(s): POCBNP  CBG: Recent Labs  Lab 02/05/21 2005 02/05/21 2332 02/06/21 0407 02/06/21 0526 02/06/21 0756  GLUCAP 136* 116* 135* 125* 132*     Microbiology: Results for orders placed or performed during the hospital encounter of 12/28/20  Resp Panel by RT-PCR (Flu A&B, Covid) Nasopharyngeal Swab     Status: None   Collection Time: 12/28/20  4:17 PM   Specimen: Nasopharyngeal Swab; Nasopharyngeal(NP) swabs in vial transport medium  Result Value Ref Range Status   SARS Coronavirus 2 by RT PCR NEGATIVE NEGATIVE Final    Comment: (NOTE) SARS-CoV-2 target nucleic acids are NOT DETECTED.  The SARS-CoV-2 RNA is generally detectable in upper respiratory specimens during the acute phase of infection. The  lowest concentration of SARS-CoV-2 viral copies this assay can detect is 138 copies/mL. A negative result does not preclude SARS-Cov-2 infection and should not be used as the sole basis for treatment or other patient management decisions. A negative result may occur with  improper specimen collection/handling, submission of specimen other than nasopharyngeal swab, presence of viral mutation(s) within the areas targeted by this assay, and inadequate number of viral copies(<138 copies/mL). A negative result must be combined with clinical observations, patient history, and epidemiological information. The expected result is Negative.  Fact Sheet for Patients:  EntrepreneurPulse.com.au  Fact Sheet for Healthcare Providers:  IncredibleEmployment.be  This test is no t yet approved or cleared by the [place] FDA and  has been authorized for detection and/or diagnosis of SARS-CoV-2 by FDA under an Emergency Use Authorization (EUA). This EUA will remain  in effect (meaning this test can be used) for the duration of the COVID-19 declaration under Section 564(b)(1) of the Act, 21 U.S.C.section 360bbb-3(b)(1), unless the authorization is terminated  or revoked sooner.       Influenza A by PCR NEGATIVE NEGATIVE Final   Influenza B by PCR NEGATIVE NEGATIVE Final    Comment: (NOTE) The Xpert Xpress SARS-CoV-2/FLU/RSV plus assay is intended as an aid in the diagnosis of influenza from Nasopharyngeal swab specimens and should not be used as a sole basis for treatment. Nasal washings and aspirates are unacceptable for Xpert Xpress SARS-CoV-2/FLU/RSV testing.  Fact Sheet for Patients: EntrepreneurPulse.com.au  Fact Sheet for Healthcare Providers: IncredibleEmployment.be  This test is not yet approved or cleared by the [place] FDA and has been authorized for detection and/or diagnosis of SARS-CoV-2 by FDA under  an Emergency Use Authorization (EUA). This EUA will remain in effect (meaning this test can be used) for the duration of the COVID-19 declaration under Section 564(b)(1) of the Act, 21 U.S.C. section 360bbb-3(b)(1), unless the authorization is terminated or revoked.  Performed at [facility], [place] [address] [place], [place] [address]   MRSA Next Gen by PCR, Nasal     Status: None   Collection Time: 12/28/20  7:32 PM   Specimen: Nasal Mucosa; Nasal Swab  Result Value Ref Range Status   MRSA by PCR Next Gen NOT DETECTED NOT DETECTED Final    Comment: (NOTE) The GeneXpert MRSA Assay (FDA approved for NASAL specimens only), is one component of a comprehensive MRSA colonization surveillance program. It is not intended to diagnose MRSA infection nor to guide or monitor treatment  for MRSA infections. Test performance is not FDA approved in patients less than 97 years old. Performed at [facility], [place] [address], [place], [place] [address]   Culture, Respiratory w Gram Stain     Status: None   Collection Time: 12/31/20 11:06 AM   Specimen: Tracheal Aspirate; Respiratory  Result Value Ref Range Status   Specimen Description TRACHEAL ASPIRATE  Final   Special Requests NONE  Final   Gram Stain   Final    FEW SQUAMOUS EPITHELIAL CELLS PRESENT FEW WBC PRESENT,BOTH PMN AND MONONUCLEAR FEW GRAM POSITIVE COCCI Performed at [facility], [place] [address] [place], [place] [address]    Culture   Final    FEW PSEUDOMONAS AERUGINOSA FEW STREPTOCOCCUS PNEUMONIAE    Report Status 01/03/2021 FINAL  Final   Organism ID, Bacteria PSEUDOMONAS AERUGINOSA  Final   Organism ID, Bacteria STREPTOCOCCUS PNEUMONIAE  Final      Susceptibility   Pseudomonas aeruginosa - MIC*    CEFTAZIDIME 4 SENSITIVE Sensitive     CIPROFLOXACIN <=0.25 SENSITIVE Sensitive     GENTAMICIN <=1 SENSITIVE Sensitive     IMIPENEM 2 SENSITIVE Sensitive     PIP/TAZO 8 SENSITIVE Sensitive     CEFEPIME 2  SENSITIVE Sensitive     * FEW PSEUDOMONAS AERUGINOSA   Streptococcus pneumoniae - MIC*    ERYTHROMYCIN 4 RESISTANT Resistant     LEVOFLOXACIN 0.5 SENSITIVE Sensitive     VANCOMYCIN <=0.12 SENSITIVE Sensitive     PENO - penicillin <=0.06      PENICILLIN (non-meningitis) <=0.06 SENSITIVE Sensitive     PENICILLIN (oral) <=0.06 SENSITIVE Sensitive     CEFTRIAXONE (non-meningitis) <=0.12 SENSITIVE Sensitive     * FEW STREPTOCOCCUS PNEUMONIAE  Culture, blood (routine x 2)     Status: None   Collection Time: 01/05/21 11:44 AM   Specimen: BLOOD  Result Value Ref Range Status   Specimen Description BLOOD SITE NOT SPECIFIED  Final   Special Requests AEROBIC BOTTLE ONLY Blood Culture adequate volume  Final   Culture   Final    NO GROWTH 5 DAYS Performed at [facility], [address] [place], [place] [address]    Report Status 01/10/2021 FINAL  Final  Culture, blood (routine x 2)     Status: None   Collection Time: 01/05/21 11:44 AM   Specimen: BLOOD  Result Value Ref Range Status   Specimen Description BLOOD SITE NOT SPECIFIED  Final   Special Requests   Final    AEROBIC BOTTLE ONLY Blood Culture results may not be optimal due to an inadequate volume of blood received in culture bottles   Culture   Final    NO GROWTH 5 DAYS Performed at [facility], [place] [address], [place], [place] [address]    Report Status 01/10/2021 FINAL  Final  Surgical PCR screen     Status: None   Collection Time: 01/08/21 12:14 AM   Specimen: Nasal Mucosa; Nasal Swab  Result Value Ref Range Status   MRSA, PCR NEGATIVE NEGATIVE Final   Staphylococcus aureus NEGATIVE NEGATIVE Final    Comment: (NOTE) The Xpert SA Assay (FDA approved for NASAL specimens in patients 41 years of age and older), is one component of a comprehensive surveillance program. It is not intended to diagnose infection nor to guide or monitor treatment. Performed at [facility], [place] [address] [place] [address]   Culture, Respiratory w Gram Stain  Status: None   Collection Time: 01/08/21  1:24 PM   Specimen: Tracheal Aspirate; Respiratory  Result Value Ref Range Status   Specimen Description TRACHEAL ASPIRATE  Final   Special Requests NONE  Final   Gram Stain   Final    RARE SQUAMOUS EPITHELIAL CELLS PRESENT MODERATE WBC PRESENT, PREDOMINANTLY MONONUCLEAR FEW GRAM NEGATIVE RODS Performed at [facility], [place] [address], [place], [place] [address]    Culture   Final    RARE PSEUDOMONAS AERUGINOSA RARE ENTEROCOCCUS FAECALIS    Report Status 01/11/2021 FINAL  Final   Organism ID, Bacteria PSEUDOMONAS AERUGINOSA  Final   Organism ID, Bacteria ENTEROCOCCUS FAECALIS  Final      Susceptibility   Enterococcus faecalis - MIC*    AMPICILLIN <=2 SENSITIVE Sensitive     VANCOMYCIN 1 SENSITIVE Sensitive     GENTAMICIN SYNERGY SENSITIVE Sensitive     * RARE ENTEROCOCCUS FAECALIS   Pseudomonas aeruginosa - MIC*    CEFTAZIDIME 4 SENSITIVE Sensitive     CIPROFLOXACIN <=0.25 SENSITIVE Sensitive     GENTAMICIN <=1 SENSITIVE Sensitive     IMIPENEM 2 SENSITIVE Sensitive     PIP/TAZO 8 SENSITIVE Sensitive     CEFEPIME 2 SENSITIVE Sensitive     * RARE PSEUDOMONAS AERUGINOSA  Gastrointestinal Panel by PCR , Stool     Status: None   Collection Time: 01/08/21  5:04 PM   Specimen: Stool  Result Value Ref Range Status   Campylobacter species NOT DETECTED NOT DETECTED Final   Plesimonas shigelloides NOT DETECTED NOT DETECTED Final   Salmonella species NOT DETECTED NOT DETECTED Final   Yersinia enterocolitica NOT DETECTED NOT DETECTED Final   Vibrio species NOT DETECTED NOT DETECTED Final   Vibrio cholerae NOT DETECTED NOT DETECTED Final   Enteroaggregative E coli (EAEC) NOT DETECTED NOT DETECTED Final   Enteropathogenic E coli (EPEC) NOT DETECTED NOT DETECTED Final   Enterotoxigenic E coli (ETEC) NOT DETECTED NOT DETECTED Final   Shiga like toxin producing E coli (STEC) NOT DETECTED  NOT DETECTED Final   Shigella/Enteroinvasive E coli (EIEC) NOT DETECTED NOT DETECTED Final   Cryptosporidium NOT DETECTED NOT DETECTED Final   Cyclospora cayetanensis NOT DETECTED NOT DETECTED Final   Entamoeba histolytica NOT DETECTED NOT DETECTED Final   Giardia lamblia NOT DETECTED NOT DETECTED Final   Adenovirus F40/41 NOT DETECTED NOT DETECTED Final   Astrovirus NOT DETECTED NOT DETECTED Final   Norovirus GI/GII NOT DETECTED NOT DETECTED Final   Rotavirus A NOT DETECTED NOT DETECTED Final   Sapovirus (I, II, IV, and V) NOT DETECTED NOT DETECTED Final    Comment: Performed at [facility], [place] [place], [place] [address]  C Difficile Quick Screen (NO PCR Reflex)     Status: None   Collection Time: 01/09/21 11:07 AM   Specimen: STOOL  Result Value Ref Range Status   C Diff antigen NEGATIVE NEGATIVE Final   C Diff toxin NEGATIVE NEGATIVE Final   C Diff interpretation No C. difficile detected.  Final    Comment: Performed at [facility], [place] [address], [place], [place] [address]  Culture, Respiratory w Gram Stain     Status: None   Collection Time: 01/24/21  8:57 AM   Specimen: Tracheal Aspirate; Respiratory  Result Value Ref Range Status   Specimen Description TRACHEAL ASPIRATE  Final   Special Requests NONE  Final   Gram Stain   Final    ABUNDANT WBC PRESENT, PREDOMINANTLY  MONONUCLEAR RARE FEW GRAM NEGATIVE RODS    Culture   Final    ABUNDANT PSEUDOMONAS AERUGINOSA Two isolates with different morphologies were identified as the same organism.The most resistant organism was reported. Performed at [facility], [place] [address] [place], [place] [address]    Report Status 01/27/2021 FINAL  Final   Organism ID, Bacteria PSEUDOMONAS AERUGINOSA  Final      Susceptibility   Pseudomonas aeruginosa - MIC*    CEFTAZIDIME 16 INTERMEDIATE Intermediate     CIPROFLOXACIN 1 SENSITIVE Sensitive     GENTAMICIN <=1 SENSITIVE Sensitive     IMIPENEM 2  SENSITIVE Sensitive     * ABUNDANT PSEUDOMONAS AERUGINOSA    Coagulation Studies: No results for input(s): LABPROT, INR in the last 72 hours.  Urinalysis: No results for input(s): COLORURINE, LABSPEC, PHURINE, GLUCOSEU, HGBUR, BILIRUBINUR, KETONESUR, PROTEINUR, UROBILINOGEN, NITRITE, LEUKOCYTESUR in the last 72 hours.  Invalid input(s): APPERANCEUR    Imaging: DG Chest 1 View  Result Date: 02/04/2021 CLINICAL DATA:  Encounter for IV line placement. EXAM: CHEST  1 VIEW COMPARISON:  Radiograph 01/28/2021 FINDINGS: The previous left central line has been removed. There is a new right-sided dual lumen central line with tip overlying the right atrium. Right upper extremity PICC tip in the atrial caval junction. No pneumothorax. Tracheostomy tube and enteric tubes remain in place. Stable cardiomegaly. Stable low lung volumes. Occasional bibasilar atelectasis with slight improvement from prior. No pulmonary edema or pleural effusion. IMPRESSION: 1. New right-sided dual lumen central line with tip overlying the right atrium. No pneumothorax. 2. Right upper extremity PICC tip at the atrial caval junction. 3. Improved bibasilar atelectasis. Electronically Signed   By: Keith Rake M.D.   On: 02/04/2021 22:06   IR US Guide Vasc Access Right  Result Date: 02/04/2021 INDICATION: 70 year old male status post recent trauma with worsening acute kidney injury requiring long-term hemodialysis. EXAM: TUNNELED CENTRAL VENOUS HEMODIALYSIS CATHETER PLACEMENT WITH ULTRASOUND AND FLUOROSCOPIC GUIDANCE MEDICATIONS: Ancef 2 gm IV . The antibiotic was given in an appropriate time interval prior to skin puncture. ANESTHESIA/SEDATION: Moderate (conscious) sedation was employed during this procedure. A total of Versed 0.5 mg and Fentanyl 25 mcg was administered intravenously. Moderate Sedation Time: 13 minutes. The patient's level of consciousness and vital signs were monitored continuously by radiology nursing throughout  the procedure under my direct supervision. FLUOROSCOPY TIME:  0 minutes 6 seconds (3 mGy). COMPLICATIONS: None immediate. PROCEDURE: Informed written consent was obtained from the patient after a discussion of the risks, benefits, and alternatives to treatment. Questions regarding the procedure were encouraged and answered. The right neck and chest were prepped with chlorhexidine in a sterile fashion, and a sterile drape was applied covering the operative field. Maximum barrier sterile technique with sterile gowns and gloves were used for the procedure. A timeout was performed prior to the initiation of the procedure. After creating a small venotomy incision, a 21 gauge micropuncture kit was utilized to access the internal jugular vein. Real-time ultrasound guidance was utilized for vascular access including the acquisition of a permanent ultrasound image documenting patency of the accessed vessel. A Rosen wire was advanced to the level of the IVC and the micropuncture sheath was exchanged for an 8 Fr dilator. A 14.5 French tunneled hemodialysis catheter measuring 23 cm from tip to cuff was tunneled in a retrograde fashion from the anterior chest wall to the venotomy incision. Serial dilation was then performed an a peel-away sheath was placed. The catheter was then placed  through the peel-away sheath with the catheter tip ultimately positioned within the right atrium. Final catheter positioning was confirmed and documented with a spot radiographic image. The catheter aspirates and flushes normally. The catheter was flushed with appropriate volume heparin dwells. The catheter exit site was secured with a 0-Silk retention suture. The venotomy incision was closed with Dermabond. Sterile dressings were applied. The patient tolerated the procedure well without immediate post procedural complication. IMPRESSION: Successful placement of 23 cm tip to cuff tunneled hemodialysis catheter via the right internal jugular vein  with catheter tip terminating within the right atrium. The catheter is ready for immediate use. Ruthann Cancer, MD [facility] Electronically Signed   By: Ruthann Cancer M.D.   On: 02/04/2021 16:56     Medications:    sodium chloride     sodium chloride     sodium chloride     albumin human 25 g (01/30/21 2058)   albumin human     amiodarone 30 mg/hr (02/06/21 0539)   ceFAZolin     dextrose 5% lactated ringers Stopped (02/04/21 1907)   feeding supplement (NEPRO CARB STEADY) 1,000 mL (02/05/21 1635)    sodium chloride   Intravenous Once   acetaminophen  1,000 mg Per Tube Q6H   apixaban  5 mg Per Tube BID   chlorhexidine  15 mL Mouth Rinse BID   Chlorhexidine Gluconate Cloth  6 each Topical Q0600   docusate  100 mg Per Tube BID   feeding supplement (PROSource TF)  45 mL Per Tube BID   guaiFENesin  10 mL Per Tube Q4H   insulin aspart  0-20 Units Subcutaneous Q4H   insulin aspart  10 Units Subcutaneous Q4H   insulin glargine-yfgn  60 Units Subcutaneous BID   mouth rinse  15 mL Mouth Rinse q12n4p   methocarbamol  1,000 mg Per Tube Q8H   metoprolol tartrate  25 mg Per Tube TID   pantoprazole sodium  40 mg Per Tube Daily   polyethylene glycol  17 g Per Tube Daily   QUEtiapine  50 mg Per Tube QHS   senna  1 tablet Per Tube Daily   sodium chloride flush  10-40 mL Intracatheter Q12H   Place/Maintain arterial line **AND** sodium chloride, sodium chloride, sodium chloride, albumin human, albumin human, alteplase, artificial tears, ceFAZolin, fentaNYL, heparin, hydrALAZINE, HYDROmorphone (DILAUDID) injection, lidocaine (PF), lidocaine-prilocaine, midazolam, midazolam, ondansetron **OR** ondansetron (ZOFRAN) IV, oxyCODONE, pentafluoroprop-tetrafluoroeth, sodium chloride flush  Assessment/ Plan:   #Acute kidney injury, oliguric: Multifactorial etiology including ischemic ATN in the setting of hypotension, sepsis complicated by contrast  injury. CRRT from 9/1-9/9.  No heparin as he is on bivalirudin.  The HD catheter was changed on 9/7.  CRRT restarted on 9/11 given elevated BUN and with more confusion, did not tolerated IHD on 9/10 (confusion, hypotension). CRRT clotted off on 9/13.   -HD 02/04/2021 will attempt to maintain him on a MWF schedule.  Next dialysis will be 02/06/2021 -Will likely need his catheter to be tunneled as there are no signs of renal recovery as of yet, nonurgent, can consult IR early in the week    #Fall/bilateral subarachnoid hemorrhage/SDH, TBI/occipital and temporal bone fracture: Per trauma team.  Repeat CT scan with no acute finding.   #Acute respiratory failure: Status post trach on 8/29, trach collar   #A. fib with RVR: On amiodarone, Angiomax.   # Anemia of critical illness: Transfuse as needed.   #Metabolic acidosis: Managed with dialysis. resolved   #  Bilateral pulm embolism: Currently on anticoagulation.   #Acute febrile illness: Per primary team.   #Hyperkalemia:  K wnl now, managing with HD    LOS: [place] @TODAY @8 :08 AM

## 2021-02-06 NOTE — Progress Notes (Signed)
PT Cancellation Note  Patient Details Name: CYPHER PAULE MRN: [id number] DOB: [birth year]   Cancelled Treatment:    Reason Eval/Treat Not Completed: Patient at procedure or test/unavailable  Pt leaving for HD, PT to return as able to progress mobility.  Kittie Plater, PT, DPT Acute Rehabilitation Services Pager #: [phone] Office #: [phone]    Berline Lopes 02/06/2021, 9:32 AM

## 2021-02-06 NOTE — Progress Notes (Signed)
Speech Language Pathology Patient Details Name: Angel [name] MRN: [id number] DOB: Jul 28, 1950 Today's Date: 02/06/2021 Time:  -     FEES swallow assessment in room today at 1:30     Recommendations for follow up therapy are one component of a multi-disciplinary discharge planning process, led by the attending physician.  Recommendations may be updated based on patient status, additional functional criteria and insurance authorization.    Recommendations                           GO                Angel [name]  02/06/2021, 8:06 AM  Angel [name] Colvin Caroli.Ed Risk analyst [phone] Office [phone]

## 2021-02-06 NOTE — Procedures (Signed)
Objective Swallowing Evaluation: Type of Study: FEES-Fiberoptic Endoscopic Evaluation of Swallow   Patient Details  Name: Angel [name] MRN: [id number] Date of Birth: 1951-03-20  Today's Date: 02/06/2021 Time: SLP Start Time (ACUTE ONLY): 1456 -SLP Stop Time (ACUTE ONLY): 2992  SLP Time Calculation (min) (ACUTE ONLY): 34 min   Past Medical History:  Past Medical History:  Diagnosis Date   DM (diabetes mellitus) ([place])    HLD (hyperlipidemia)    Hypertension    Past Surgical History:  Past Surgical History:  Procedure Laterality Date   IR FLUORO GUIDE CV LINE RIGHT  02/04/2021   IR US GUIDE VASC ACCESS RIGHT  02/04/2021   TRACHEOSTOMY TUBE PLACEMENT N/A 01/14/2021   Procedure: TRACHEOSTOMY;  Surgeon: Jesusita Oka, MD;  Location: [facility];  Service: General;  Laterality: N/A;   HPI: [name] is a 70 y.o. 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 CT showed R temporal and parietal SAH, SAH anterior frontal lobes  bilaterally. Also sustained right occipital skull fracture, temporal bone fx, right TM rupture, bilateral PE. Intubated 8/12, trach'd 8/29. PMH: DM2, HTN   Subjective: Pt awake and upright in bed wearing PMV.    Assessment / Plan / Recommendation  CHL IP CLINICAL IMPRESSIONS 02/06/2021  Clinical Impression Pt was seen for FEES. Pt upright in bed with PMV donned. SLP noted no anatomical or secretional abnormalities within pharyngeal space. Questionable base of tongue candidias. Pt performed non-swallow speech tasks and SLP observed pharyngeal wall contraction and base of tongue retraction that were mildly reduced but functional. SLP administered thin, nectar thick, and honey thick liquids, puree, and regular solids. Timing and coordination of swallow were adequate. Overall oral phase intact and pharyngeal phase impairments c/b mild residue in vallecula and pyriform sinuses with liquids and puree and increasing to moderate with regular solids cleared by multiple  swallows and liquid wash. No penetration or aspiration observed across consistencies. While alertness has greatly improved, pt had periods of lethargy and decreased awareness, therefore, recommend Dys 2/thin liquid diet. Implement multiple swallows strategy especially after solids and alternate between solids and liquids. Administer medication crushed in puree and donn valve with all po's.  SLP Visit Diagnosis Dysphagia, pharyngeal phase (R13.13)  Attention and concentration deficit following --  Frontal lobe and executive function deficit following --  Impact on safety and function Mild aspiration risk      CHL IP TREATMENT RECOMMENDATION 02/06/2021  Treatment Recommendations Therapy as outlined in treatment plan below     Prognosis 02/06/2021  Prognosis for Safe Diet Advancement Good  Barriers to Reach Goals Cognitive deficits;Severity of deficits  Barriers/Prognosis Comment --    CHL IP DIET RECOMMENDATION 02/06/2021  SLP Diet Recommendations Dysphagia 2 (Fine chop) solids;Thin liquid  Liquid Administration via Cup;Straw  Medication Administration Crushed with puree  Compensations Minimize environmental distractions;Multiple dry swallows after each bite/sip;Follow solids with liquid;Small sips/bites;Slow rate  Postural Changes Seated upright at 90 degrees      CHL IP OTHER RECOMMENDATIONS 02/06/2021  Recommended Consults --  Oral Care Recommendations Oral care BID  Other Recommendations --      CHL IP FOLLOW UP RECOMMENDATIONS 02/06/2021  Follow up Recommendations LTACH      CHL IP FREQUENCY AND DURATION 02/06/2021  Speech Therapy Frequency (ACUTE ONLY) min 2x/week  Treatment Duration 2 weeks           CHL IP ORAL PHASE 02/06/2021  Oral Phase WFL  Oral - Pudding Teaspoon --  Oral - Pudding  Cup --  Oral - Honey Teaspoon --  Oral - Honey Cup --  Oral - Nectar Teaspoon --  Oral - Nectar Cup --  Oral - Nectar Straw --  Oral - Thin Teaspoon --  Oral - Thin Cup --  Oral -  Thin Straw --  Oral - Puree --  Oral - Mech Soft --  Oral - Regular --  Oral - Multi-Consistency --  Oral - Pill --  Oral Phase - Comment --    CHL IP PHARYNGEAL PHASE 02/06/2021  Pharyngeal Phase Impaired  Pharyngeal- Pudding Teaspoon --  Pharyngeal --  Pharyngeal- Pudding Cup --  Pharyngeal --  Pharyngeal- Honey Teaspoon Pharyngeal residue - valleculae;Pharyngeal residue - pyriform;Compensatory strategies attempted (with notebox)  Pharyngeal --  Pharyngeal- Honey Cup Pharyngeal residue - valleculae;Pharyngeal residue - pyriform  Pharyngeal --  Pharyngeal- Nectar Teaspoon Pharyngeal residue - valleculae;Pharyngeal residue - pyriform  Pharyngeal --  Pharyngeal- Nectar Cup Pharyngeal residue - valleculae;Pharyngeal residue - pyriform  Pharyngeal --  Pharyngeal- Nectar Straw Pharyngeal residue - valleculae;Pharyngeal residue - pyriform  Pharyngeal --  Pharyngeal- Thin Teaspoon WFL  Pharyngeal --  Pharyngeal- Thin Cup --  Pharyngeal --  Pharyngeal- Thin Straw WFL  Pharyngeal --  Pharyngeal- Puree Pharyngeal residue - valleculae;Pharyngeal residue - pyriform  Pharyngeal --  Pharyngeal- Mechanical Soft --  Pharyngeal --  Pharyngeal- Regular Pharyngeal residue - valleculae;Pharyngeal residue - pyriform;Compensatory strategies attempted (with notebox)  Pharyngeal --  Pharyngeal- Multi-consistency --  Pharyngeal --  Pharyngeal- Pill --  Pharyngeal --  Pharyngeal Comment --     CHL IP CERVICAL ESOPHAGEAL PHASE 02/06/2021  Cervical Esophageal Phase WFL  Pudding Teaspoon --  Pudding Cup --  Honey Teaspoon --  Honey Cup --  Nectar Teaspoon --  Nectar Cup --  Nectar Straw --  Thin Teaspoon --  Thin Cup --  Thin Straw --  Puree --  Mechanical Soft --  Regular --  Multi-consistency --  Pill --  Cervical Esophageal Comment --     Houston Siren 02/06/2021, 4:47 PM

## 2021-02-06 NOTE — Plan of Care (Signed)

## 2021-02-06 NOTE — Progress Notes (Signed)
Patient ID: Angel [name], male   DOB: 06/02/1950, 70 y.o.   MRN: [id number] 23 Days Post-Op   Subjective: Seen in HD. No new complaints. Having bowel function. Tolerating tube feeds Per RN had on systolic pressure in the 59'R during HD, monitoring.   ROS negative except as listed above. Objective: Vital signs in last 24 hours: Temp:  [98.4 F (36.9 C)-99.4 F (37.4 C)] 98.4 F (36.9 C) (09/21 1017) Pulse Rate:  [72-124] 72 (09/21 1028) Resp:  [14-25] 25 (09/21 1028) BP: (89-112)/(52-67) 101/56 (09/21 1028) SpO2:  [94 %-99 %] 98 % (09/21 1028) FiO2 (%):  [21 %] 21 % (09/21 0918) Weight:  [106.4 kg-109 kg] 109 kg (09/21 1017) Last BM Date: 02/05/21  Intake/Output from previous day: 09/20 0701 - 09/21 0700 In: 552.3 [I.V.:342.3; NG/GT:210] Out: -  Intake/Output this shift: No intake/output data recorded.  General appearance: cooperative Resp: clear to auscultation bilaterally Cardio: RRR, HR in 80's during my exam  GI: soft, non-tender; bowel sounds normal; no masses,  no organomegaly Extremities: less edema Neurologic: Mental status: alert, F/C MAE  Lab Results: CBC  Recent Labs    02/05/21 0010 02/06/21 0426  WBC 6.9 6.6  HGB 8.7* 7.7*  HCT 27.3* 25.4*  PLT 323 356   BMET Recent Labs    02/05/21 1550 02/06/21 0427  NA 134* 132*  K 3.3* 3.1*  CL 96* 96*  CO2 23 22  GLUCOSE 132* 153*  BUN 89* 108*  CREATININE 2.41* 2.44*  CALCIUM 8.9 8.9   PT/INR No results for input(s): LABPROT, INR in the last 72 hours. ABG No results for input(s): PHART, HCO3 in the last 72 hours.  Invalid input(s): PCO2, PO2  Studies/Results: DG Chest 1 View  Result Date: 02/04/2021 CLINICAL DATA:  Encounter for IV line placement. EXAM: CHEST  1 VIEW COMPARISON:  Radiograph 01/28/2021 FINDINGS: The previous left central line has been removed. There is a new right-sided dual lumen central line with tip overlying the right atrium. Right upper extremity PICC tip in the atrial caval  junction. No pneumothorax. Tracheostomy tube and enteric tubes remain in place. Stable cardiomegaly. Stable low lung volumes. Occasional bibasilar atelectasis with slight improvement from prior. No pulmonary edema or pleural effusion. IMPRESSION: 1. New right-sided dual lumen central line with tip overlying the right atrium. No pneumothorax. 2. Right upper extremity PICC tip at the atrial caval junction. 3. Improved bibasilar atelectasis. Electronically Signed   By: Keith Rake M.D.   On: 02/04/2021 22:06   IR US Guide Vasc Access Right  Result Date: 02/04/2021 INDICATION: 70 year old male status post recent trauma with worsening acute kidney injury requiring long-term hemodialysis. EXAM: TUNNELED CENTRAL VENOUS HEMODIALYSIS CATHETER PLACEMENT WITH ULTRASOUND AND FLUOROSCOPIC GUIDANCE MEDICATIONS: Ancef 2 gm IV . The antibiotic was given in an appropriate time interval prior to skin puncture. ANESTHESIA/SEDATION: Moderate (conscious) sedation was employed during this procedure. A total of Versed 0.5 mg and Fentanyl 25 mcg was administered intravenously. Moderate Sedation Time: 13 minutes. The patient's level of consciousness and vital signs were monitored continuously by radiology nursing throughout the procedure under my direct supervision. FLUOROSCOPY TIME:  0 minutes 6 seconds (3 mGy). COMPLICATIONS: None immediate. PROCEDURE: Informed written consent was obtained from the patient after a discussion of the risks, benefits, and alternatives to treatment. Questions regarding the procedure were encouraged and answered. The right neck and chest were prepped with chlorhexidine in a sterile fashion, and a sterile drape was applied covering the operative field. Maximum barrier  sterile technique with sterile gowns and gloves were used for the procedure. A timeout was performed prior to the initiation of the procedure. After creating a small venotomy incision, a 21 gauge micropuncture kit was utilized to access  the internal jugular vein. Real-time ultrasound guidance was utilized for vascular access including the acquisition of a permanent ultrasound image documenting patency of the accessed vessel. A Rosen wire was advanced to the level of the IVC and the micropuncture sheath was exchanged for an 8 Fr dilator. A 14.5 French tunneled hemodialysis catheter measuring 23 cm from tip to cuff was tunneled in a retrograde fashion from the anterior chest wall to the venotomy incision. Serial dilation was then performed an a peel-away sheath was placed. The catheter was then placed through the peel-away sheath with the catheter tip ultimately positioned within the right atrium. Final catheter positioning was confirmed and documented with a spot radiographic image. The catheter aspirates and flushes normally. The catheter was flushed with appropriate volume heparin dwells. The catheter exit site was secured with a 0-Silk retention suture. The venotomy incision was closed with Dermabond. Sterile dressings were applied. The patient tolerated the procedure well without immediate post procedural complication. IMPRESSION: Successful placement of 23 cm tip to cuff tunneled hemodialysis catheter via the right internal jugular vein with catheter tip terminating within the right atrium. The catheter is ready for immediate use. Ruthann Cancer, MD [facility] Radiology Electronically Signed   By: Ruthann Cancer M.D.   On: 02/04/2021 16:56    Anti-infectives: Anti-infectives (From admission, onward)    Start     Dose/Rate Route Frequency Ordered Stop   02/04/21 1548  ceFAZolin (ANCEF) IVPB 2g/100 mL premix        over 30 Minutes  Continuous PRN 02/04/21 1549     02/04/21 1545  ceFAZolin (ANCEF) IVPB 1 g/50 mL premix        1 g 100 mL/hr over 30 Minutes Intravenous  Once 02/04/21 1458 02/04/21 1600   02/04/21 1543  ceFAZolin (ANCEF) 2-4 GM/100ML-% IVPB       Note to Pharmacy: [place]   : cabinet override      02/04/21 1543 02/04/21 1651   01/11/21 2330  ceFEPIme (MAXIPIME) 2 g in sodium chloride 0.9 % 100 mL IVPB  Status:  Discontinued        2 g 200 mL/hr over 30 Minutes Intravenous Every 24 hours 01/11/21 0711 01/16/21 0907   01/10/21 1645  ampicillin (OMNIPEN) 2 g in sodium chloride 0.9 % 100 mL IVPB  Status:  Discontinued        2 g 300 mL/hr over 20 Minutes Intravenous Every 8 hours 01/10/21 1549 01/16/21 0907   01/09/21 2200  ceFEPIme (MAXIPIME) 2 g in sodium chloride 0.9 % 100 mL IVPB  Status:  Discontinued        2 g 200 mL/hr over 30 Minutes Intravenous Every 12 hours 01/09/21 1458 01/11/21 0711   01/08/21 1515  metroNIDAZOLE (FLAGYL) IVPB 500 mg  Status:  Discontinued        500 mg 100 mL/hr over 60 Minutes Intravenous Every 8 hours 01/08/21 1428 01/10/21 1618   01/03/21 0600  vancomycin (VANCOREADY) IVPB 1250 mg/250 mL  Status:  Discontinued        1,250 mg 166.7 mL/hr over 90 Minutes Intravenous Every 12 hours 01/02/21 1717 01/03/21 0837   01/02/21 1800  vancomycin (VANCOREADY) IVPB 2000 mg/400 mL  2,000 mg 200 mL/hr over 120 Minutes Intravenous  Once 01/02/21 1712 01/02/21 2007   01/02/21 0900  ceFEPIme (MAXIPIME) 2 g in sodium chloride 0.9 % 100 mL IVPB  Status:  Discontinued        2 g 200 mL/hr over 30 Minutes Intravenous Every 8 hours 01/02/21 0849 01/09/21 1458       Assessment/Plan: Fall down stairs 8/12   VDRF - guaifenisen, S/P trach 8/29 by Dr. Bobbye Morton. Has tolerated HTC well, passy muir trials.  Trach changed to cuffless #6 on 9/20. Hope to downsize to #4 cuffless once secretions improve, hopefully later this week or early next. ID - off abx, no fevers TBI/SAH/SDH - NSGY c/s, Dr. Annette Stable. Significant frontal lobe injuries. Keppra x7d for sz ppx (completed) Occipital bone fx - NSGY c/s, Dr. Annette Stable Temporal bone fx extending into middle ear - ENT c/s, Dr. Constance Holster, no acute treatment, will need re-eval hearing and facial nerve  Right TM  Rupture - ENT c/s, Dr. Phoebe Sharps -  per Cardiology, IV amio transitioned to 200 mg amio per tube today 9/21. lopressor added per Cardiology 9/17 ABL anemia- 1u PRBC 9/19, Hb 7.7 from 8.7, monitor Bilateral pulmonary embolism - Eliquis AKI - HD per Renal, IR placed tunneled HD cath 9/19 Hx DM2 - resistant SSI, novolog q4, glargine 60u BID Hx HTN - PRN meds FEN - NPO, tolerating TF, ST eval 9/20 continue current recs, FEES 9/21 at 1:30, replete hypokalemia VTE - SCDs, Eliquis Dispo - 4NP, HTC, therapies, FEES today   LOS: 40 days    Obie Dredge, PA-C  Trauma & General Surgery Use [email or web address] to contact on call provider  02/06/2021

## 2021-02-06 NOTE — TOC Progression Note (Signed)
Transition of Care [facility]) - Progression Note    Patient Details  Name: ADRIN [name] MRN: [id number] Date of Birth: 1951/05/02  [facility] CM/SW Contact  Oren Section Cleta Alberts, RN Phone Number: 02/06/2021, 3:33 PM  Clinical Narrative:    Spoke with patient's wife, [name], regarding discharge planning: We discussed potential dispositions, including SNF and long-term acute care.  Mrs. Pasquarello is interested in [facility] hospital, and preferably [facility] [place].  Referral to admissions coordinator with [facility] to review for eligibility.  Will follow for potential admission, pending eligibility and insurance authorization.   Expected Discharge Plan: Long Term Acute Care (LTAC) Barriers to Discharge: Continued Medical Work up  Expected Discharge Plan and Services Expected Discharge Plan: [place] (LTAC)   Discharge Planning Services: CM Consult   Living arrangements for the past 2 months: Single Family Home                                       Social Determinants of Health (SDOH) Interventions    Readmission Risk Interventions No flowsheet data found.  Reinaldo Raddle, RN, BSN  Trauma/Neuro ICU Case Manager [phone]

## 2021-02-06 NOTE — Progress Notes (Signed)
Progress Note  Patient Name: Angel [name] Date of Encounter: 02/06/2021  Primary Cardiologist:   Werner Lean, MD   Subjective   BP 96/52 this morning.  Converted to normal sinus rhythm.  Denies any chest pain .  Inpatient Medications    Scheduled Meds:  sodium chloride   Intravenous Once   acetaminophen  1,000 mg Per Tube Q6H   apixaban  5 mg Per Tube BID   chlorhexidine  15 mL Mouth Rinse BID   Chlorhexidine Gluconate Cloth  6 each Topical Q0600   docusate  100 mg Per Tube BID   feeding supplement (PROSource TF)  45 mL Per Tube BID   guaiFENesin  10 mL Per Tube Q4H   insulin aspart  0-20 Units Subcutaneous Q4H   insulin aspart  10 Units Subcutaneous Q4H   insulin glargine-yfgn  60 Units Subcutaneous BID   mouth rinse  15 mL Mouth Rinse q12n4p   methocarbamol  1,000 mg Per Tube Q8H   metoprolol tartrate  25 mg Per Tube TID   pantoprazole sodium  40 mg Per Tube Daily   polyethylene glycol  17 g Per Tube Daily   QUEtiapine  50 mg Per Tube QHS   senna  1 tablet Per Tube Daily   sodium chloride flush  10-40 mL Intracatheter Q12H   Continuous Infusions:  sodium chloride     sodium chloride     sodium chloride     sodium chloride     sodium chloride     albumin human 25 g (01/30/21 2058)   albumin human     amiodarone 30 mg/hr (02/06/21 0539)   ceFAZolin     dextrose 5% lactated ringers Stopped (02/04/21 1907)   feeding supplement (NEPRO CARB STEADY) 1,000 mL (02/06/21 0936)   PRN Meds: Place/Maintain arterial line **AND** sodium chloride, sodium chloride, sodium chloride, sodium chloride, sodium chloride, albumin human, albumin human, alteplase, alteplase, artificial tears, ceFAZolin, fentaNYL, heparin, heparin, hydrALAZINE, HYDROmorphone (DILAUDID) injection, lidocaine (PF), lidocaine (PF), lidocaine-prilocaine, lidocaine-prilocaine, midazolam, midazolam, ondansetron **OR** ondansetron (ZOFRAN) IV, oxyCODONE, pentafluoroprop-tetrafluoroeth,  pentafluoroprop-tetrafluoroeth, sodium chloride flush   Vital Signs    Vitals:   02/06/21 0428 02/06/21 0505 02/06/21 0827 02/06/21 0918  BP:    (!) 96/52  Pulse:  80  77  Resp:  _0 Temp:      TempSrc:      SpO2:  94%  95%  Weight: 106.4 kg     Height:        Intake/Output Summary (Last 24 hours) at 02/06/2021 0957 Last data filed at 02/06/2021 0047 Gross per 24 hour  Intake 552.3 ml  Output --  Net 552.3 ml    Filed Weights   02/04/21 1641 02/04/21 2030 02/06/21 0428  Weight: 100.8 kg 98.8 kg 106.4 kg    Telemetry   Converted to normal sinus rhythm yesterday evening, currently in sinus rhythm in 70s- Personally Reviewed  ECG    Normal sinus rhythm, rate 87, QTc 541 personally Reviewed  Physical Exam   GEN: ill appearing, trached HEENT: Normal NECK: difficult to assess JVD due to body habitus.  Trach present CARDIAC: Regular rate and rhythm, no murmurs, rubs, gallops RESPIRATORY:  Clear to auscultation without rales, wheezing or rhonchi  ABDOMEN: Soft, non-tender, non-distended MUSCULOSKELETAL:  No edema SKIN: Warm and dry NEUROLOGIC:  alert, answers questions PSYCHIATRIC: cannot assess  Labs    Chemistry Recent Labs  Lab 02/05/21 0010 02/05/21 1550 02/06/21 0427  NA 134*  134* 132*  K 3.9 3.3* 3.1*  CL 95* 96* 96*  CO2 21* 23 22  GLUCOSE 180* 132* 153*  BUN 57* 89* 108*  CREATININE 1.71* 2.41* 2.44*  CALCIUM 8.8* 8.9 8.9  ALBUMIN 2.5* 2.2* 2.2*  GFRNONAA 43* 28* 28*  ANIONGAP 18* 15 14      Hematology Recent Labs  Lab 02/04/21 0313 02/05/21 0010 02/06/21 0426  WBC 7.3 6.9 6.6  RBC 2.53* 3.26* 2.95*  HGB 6.7* 8.7* 7.7*  HCT 21.4* 27.3* 25.4*  MCV 84.6 83.7 86.1  MCH 26.5 26.7 26.1  MCHC 31.3 31.9 30.3  RDW 17.9* 17.7* 17.7*  PLT 262 323 356     Cardiac EnzymesNo results for input(s): TROPONINI in the last 168 hours. No results for input(s): TROPIPOC in the last 168 hours.   BNPNo results for input(s): BNP, PROBNP in the  last 168 hours.   DDimer No results for input(s): DDIMER in the last 168 hours.   Radiology    DG Chest 1 View  Result Date: 02/04/2021 CLINICAL DATA:  Encounter for IV line placement. EXAM: CHEST  1 VIEW COMPARISON:  Radiograph 01/28/2021 FINDINGS: The previous left central line has been removed. There is a new right-sided dual lumen central line with tip overlying the right atrium. Right upper extremity PICC tip in the atrial caval junction. No pneumothorax. Tracheostomy tube and enteric tubes remain in place. Stable cardiomegaly. Stable low lung volumes. Occasional bibasilar atelectasis with slight improvement from prior. No pulmonary edema or pleural effusion. IMPRESSION: 1. New right-sided dual lumen central line with tip overlying the right atrium. No pneumothorax. 2. Right upper extremity PICC tip at the atrial caval junction. 3. Improved bibasilar atelectasis. Electronically Signed   By: Keith Rake M.D.   On: 02/04/2021 22:06   IR US Guide Vasc Access Right  Result Date: 02/04/2021 INDICATION: 70 year old male status post recent trauma with worsening acute kidney injury requiring long-term hemodialysis. EXAM: TUNNELED CENTRAL VENOUS HEMODIALYSIS CATHETER PLACEMENT WITH ULTRASOUND AND FLUOROSCOPIC GUIDANCE MEDICATIONS: Ancef 2 gm IV . The antibiotic was given in an appropriate time interval prior to skin puncture. ANESTHESIA/SEDATION: Moderate (conscious) sedation was employed during this procedure. A total of Versed 0.5 mg and Fentanyl 25 mcg was administered intravenously. Moderate Sedation Time: 13 minutes. The patient's level of consciousness and vital signs were monitored continuously by radiology nursing throughout the procedure under my direct supervision. FLUOROSCOPY TIME:  0 minutes 6 seconds (3 mGy). COMPLICATIONS: None immediate. PROCEDURE: Informed written consent was obtained from the patient after a discussion of the risks, benefits, and alternatives to treatment. Questions  regarding the procedure were encouraged and answered. The right neck and chest were prepped with chlorhexidine in a sterile fashion, and a sterile drape was applied covering the operative field. Maximum barrier sterile technique with sterile gowns and gloves were used for the procedure. A timeout was performed prior to the initiation of the procedure. After creating a small venotomy incision, a 21 gauge micropuncture kit was utilized to access the internal jugular vein. Real-time ultrasound guidance was utilized for vascular access including the acquisition of a permanent ultrasound image documenting patency of the accessed vessel. A Rosen wire was advanced to the level of the IVC and the micropuncture sheath was exchanged for an 8 Fr dilator. A 14.5 French tunneled hemodialysis catheter measuring 23 cm from tip to cuff was tunneled in a retrograde fashion from the anterior chest wall to the venotomy incision. Serial dilation was then performed an a peel-away sheath  was placed. The catheter was then placed through the peel-away sheath with the catheter tip ultimately positioned within the right atrium. Final catheter positioning was confirmed and documented with a spot radiographic image. The catheter aspirates and flushes normally. The catheter was flushed with appropriate volume heparin dwells. The catheter exit site was secured with a 0-Silk retention suture. The venotomy incision was closed with Dermabond. Sterile dressings were applied. The patient tolerated the procedure well without immediate post procedural complication. IMPRESSION: Successful placement of 23 cm tip to cuff tunneled hemodialysis catheter via the right internal jugular vein with catheter tip terminating within the right atrium. The catheter is ready for immediate use. Ruthann Cancer, MD [facility] Radiology Electronically Signed   By: Ruthann Cancer M.D.   On: 02/04/2021 16:56    Cardiac  Studies   Echo 01/07/21: 1. Left ventricular ejection fraction, by estimation, is 60 to 65%. The  left ventricle has normal function. The left ventricle has no regional  wall motion abnormalities. There is mild left ventricular hypertrophy.  Left ventricular diastolic parameters  are indeterminate.   2. Right ventricule is poorly visualized but grossly normal size and  systolic function   3. Left atrial size was mildly dilated.   4. Right atrial size was mildly dilated.   5. The mitral valve is normal in structure. No evidence of mitral valve  regurgitation. No evidence of mitral stenosis.   6. The aortic valve was not well visualized. Aortic valve regurgitation  is not visualized. No aortic stenosis is present.   Patient Profile     70 y.o. male with a PMH of hyperlipidemia, DM type II, who presented with fall resulting in subarachnoid hemorrhage requiring intubation with hospital course complicated by Multi lobar pneumonia, PE, sepsis, AKI requiring CRRT, and new onset atrial fibrillation/flutter for which cardiology is following.  Assessment & Plan    New onset paroxysmal atrial fibrillation  -Started on Eliquis, also with bilateral pulmonary embolism.  Hgb trending down slowly, was 6.7 on 9/19 and given 1 unit PRBCs.  Will need to monitor closely on anticoagulation -Converted to normal sinus rhythm.  We will switch from IV amiodarone to p.o. amiodarone.  He has had over a 10 g load of amiodarone at this point, will transition to amiodarone 200 mg daily.  Will change metoprolol to 25 mg twice daily  Hypoxic respiratory failure -Hospital course complicated by multilobar PNA and bilateral PE -Continue vent management per primary team.   AKI -Was on CRRT, now HD due to issues with CRRT  HTN -BP normal  -continue metoprolol   QT prolongation -QTC 541 on EKG this morning.  We will need to monitor as on amiodarone.  Suspect hypokalemia contributing, K 3.1 today.  Would replete for  K greater than 4, mag greater than 2   For questions or updates, please contact [place] Please consult [email or web address] for contact info under Cardiology/STEMI.   Donato Heinz, MD  02/06/2021, 9:57 AM

## 2021-02-07 DIAGNOSIS — R9431 Abnormal electrocardiogram [ECG] [EKG]: Secondary | ICD-10-CM | POA: Diagnosis not present

## 2021-02-07 DIAGNOSIS — I4891 Unspecified atrial fibrillation: Secondary | ICD-10-CM | POA: Diagnosis not present

## 2021-02-07 DIAGNOSIS — Z20822 Contact with and (suspected) exposure to covid-19: Secondary | ICD-10-CM | POA: Diagnosis not present

## 2021-02-07 DIAGNOSIS — S066X9A Traumatic subarachnoid hemorrhage with loss of consciousness of unspecified duration, initial encounter: Secondary | ICD-10-CM | POA: Diagnosis not present

## 2021-02-07 DIAGNOSIS — I2699 Other pulmonary embolism without acute cor pulmonale: Secondary | ICD-10-CM | POA: Diagnosis not present

## 2021-02-07 DIAGNOSIS — J9601 Acute respiratory failure with hypoxia: Secondary | ICD-10-CM | POA: Diagnosis not present

## 2021-02-07 LAB — CBC
HCT: 24.2 % — ABNORMAL LOW (ref 39.0–52.0)
Hemoglobin: 7.5 g/dL — ABNORMAL LOW (ref 13.0–17.0)
MCH: 26.2 pg (ref 26.0–34.0)
MCHC: 31 g/dL (ref 30.0–36.0)
MCV: 84.6 fL (ref 80.0–100.0)
Platelets: 323 10*3/uL (ref 150–400)
RBC: 2.86 MIL/uL — ABNORMAL LOW (ref 4.22–5.81)
RDW: 17.6 % — ABNORMAL HIGH (ref 11.5–15.5)
WBC: 7.2 10*3/uL (ref 4.0–10.5)
nRBC: 0 % (ref 0.0–0.2)

## 2021-02-07 LAB — GLUCOSE, CAPILLARY
Glucose-Capillary: 139 mg/dL — ABNORMAL HIGH (ref 70–99)
Glucose-Capillary: 128 mg/dL — ABNORMAL HIGH (ref 70–99)
Glucose-Capillary: 169 mg/dL — ABNORMAL HIGH (ref 70–99)
Glucose-Capillary: 120 mg/dL — ABNORMAL HIGH (ref 70–99)
Glucose-Capillary: 142 mg/dL — ABNORMAL HIGH (ref 70–99)
Glucose-Capillary: 194 mg/dL — ABNORMAL HIGH (ref 70–99)

## 2021-02-07 LAB — RENAL FUNCTION PANEL
Albumin: 2.6 g/dL — ABNORMAL LOW (ref 3.5–5.0)
Anion gap: 13 (ref 5–15)
BUN: 66 mg/dL — ABNORMAL HIGH (ref 8–23)
CO2: 26 mmol/L (ref 22–32)
Calcium: 9.1 mg/dL (ref 8.9–10.3)
Chloride: 96 mmol/L — ABNORMAL LOW (ref 98–111)
Creatinine, Ser: 1.94 mg/dL — ABNORMAL HIGH (ref 0.61–1.24)
GFR, Estimated: 37 mL/min — ABNORMAL LOW (ref >60)
Glucose, Bld: 121 mg/dL — ABNORMAL HIGH (ref 70–99)
Phosphorus: 3.8 mg/dL (ref 2.5–4.6)
Potassium: 3.7 mmol/L (ref 3.5–5.1)
Sodium: 135 mmol/L (ref 135–145)

## 2021-02-07 LAB — MAGNESIUM: Magnesium: 2.1 mg/dL (ref 1.7–2.4)

## 2021-02-07 MED ORDER — NEPRO/CARBSTEADY PO LIQD
237.0000 mL | Freq: Two times a day (BID) | ORAL | Status: DC
  Administered 2021-02-08 – 2021-02-11 (×6): 237 mL via ORAL
  Filled 2021-02-07: qty 237

## 2021-02-07 MED ORDER — NEPRO/CARBSTEADY PO LIQD
1000.0000 mL | ORAL | Status: DC
  Administered 2021-02-09 – 2021-02-10 (×2): 1000 mL
  Filled 2021-02-07 (×5): qty 1000

## 2021-02-07 MED ORDER — CHLORHEXIDINE GLUCONATE CLOTH 2 % EX PADS
6.0000 | MEDICATED_PAD | Freq: Every day | CUTANEOUS | Status: DC
  Administered 2021-02-07 – 2021-02-14 (×8): 6 via TOPICAL

## 2021-02-07 MED ORDER — AMIODARONE HCL 200 MG PO TABS
200.0000 mg | ORAL_TABLET | Freq: Two times a day (BID) | ORAL | Status: DC
  Administered 2021-02-07 – 2021-02-08 (×2): 200 mg
  Filled 2021-02-07 (×2): qty 1

## 2021-02-07 MED ORDER — METOPROLOL TARTRATE 25 MG/10 ML ORAL SUSPENSION
25.0000 mg | Freq: Three times a day (TID) | ORAL | Status: DC
  Administered 2021-02-07 – 2021-02-08 (×3): 25 mg
  Filled 2021-02-07 (×3): qty 10

## 2021-02-07 NOTE — Progress Notes (Signed)
Inpatient Rehab Admissions Coordinator:   Discussed in trauma rounds.  Now on 28% trach collar, and working towards iHD.  Pt continues to demonstrate profound functional deficits and requires total assist +2 for all aspects of mobility and self care.  Insurance would not likely approve CIR for decreased burden of care/planned SNF rehab.  Pt is making progress over the last week, but I do not think it would be wise to transition him to CIR at this point as it would potentially limit his ultimate potential.  I will follow from a distance and, if he remains in the acute setting, I will rescreen next week.   Shann Medal, PT, DPT Admissions Coordinator [phone] 02/07/21  3:39 PM

## 2021-02-07 NOTE — Progress Notes (Signed)
[facility] ROUNDING NOTE   Subjective:   Interval History: This is a 70 year old gentleman who presented 12/28/2020 with a traumatic brain injury subarachnoid hemorrhage subdural hemorrhage temporal bone fracture extending into the middle area occipital bone fracture and right tympanic membrane rupture.  Hospital course was complicated by pulmonary embolus 01/09/2021.  He had acute kidney injury and was started on CVVHD 01/18/2021.  He was transitioned to acute intermittent hemodialysis.  He does not appear to have recovery of renal function.  His last dialysis was 02/06/2021 with 1.2 L removed.  His next dialysis treatment will be 02/08/2021  Blood pressure 106/70 pulse 81 temperature 99 O2 sats 96% 5 L  Sodium 135 potassium 3.7 chloride 96 CO2 26 BUN 6 6 creatinine 1.94 glucose 121 calcium 9.1 phosphorus 3.8 magnesium 2.1 hemoglobin 7.5  Objective:  Vital signs in last 24 hours:  Temp:  [98.4 F (36.9 C)-99.7 F (37.6 C)] 99 F (37.2 C) (09/22 0827) Pulse Rate:  [58-89] 79 (09/22 0827) Resp:  [14-25] 18 (09/22 0829) BP: (73-128)/(47-90) 106/70 (09/22 0827) SpO2:  [94 %-100 %] 95 % (09/22 0827) FiO2 (%):  [21 %] 21 % (09/22 0829) Weight:  [106.4 kg-109 kg] 106.4 kg (09/22 0419)  Weight change: 2.6 kg Filed Weights   02/06/21 1017 02/06/21 1423 02/07/21 0419  Weight: 109 kg 108.1 kg 106.4 kg    Intake/Output: I/O last 3 completed shifts: In: 210 [NG/GT:210] Out: 8299 [Urine:325; Other:921]   Intake/Output this shift:  Total I/O In: 240 [P.O.:240] Out: -   General: nad, tracheostomy Heart: rrr Lungs: trach, cta bl Abdomen:soft, nontender. Extremities: trace dependent edema. Neurology:Alert awake  Dialysis Access: Left subclavian temporary HD catheter placed on 9/7   Basic Metabolic Panel: Recent Labs  Lab 02/03/21 0339 02/03/21 1600 02/04/21 0313 02/04/21 1705 02/05/21 0010 02/05/21 1550 02/06/21 0426 02/06/21 0427 02/06/21 1720 02/07/21 0430  NA  134*   < > 134*   < > 134* 134*  --  132* 134* 135  K 3.2*   < > 2.9*   < > 3.9 3.3*  --  3.1* 3.8 3.7  CL 93*   < > 91*   < > 95* 96*  --  96* 97* 96*  CO2 25   < > 24   < > 21* 23  --  22 26 26   GLUCOSE 149*   < > 97   < > 180* 132*  --  153* 128* 121*  BUN 72*   < > 115*   < > 57* 89*  --  108* 48* 66*  CREATININE 2.51*   < > 2.87*   < > 1.71* 2.41*  --  2.44* 1.50* 1.94*  CALCIUM 9.1   < > 9.2   < > 8.8* 8.9  --  8.9 8.7* 9.1  MG 2.4  --  2.7*  --  2.1  --  2.4  --   --  2.1  PHOS 4.8*   < > 5.7*   < > 3.4 4.8*  --  5.2* 2.6 3.8   < > = values in this interval not displayed.     Liver Function Tests: Recent Labs  Lab 02/05/21 0010 02/05/21 1550 02/06/21 0427 02/06/21 1720 02/07/21 0430  ALBUMIN 2.5* 2.2* 2.2* 2.7* 2.6*    No results for input(s): LIPASE, AMYLASE in the last 168 hours. No results for input(s): AMMONIA in the last 168 hours.  CBC: Recent Labs  Lab 02/02/21 2147 02/04/21 0313 02/05/21 0010 02/06/21  1740 02/07/21 0430  WBC 7.6 7.3 6.9 6.6 7.2  HGB 7.0* 6.7* 8.7* 7.7* 7.5*  HCT 22.4* 21.4* 27.3* 25.4* 24.2*  MCV 83.9 84.6 83.7 86.1 84.6  PLT 219 262 323 356 323     Cardiac Enzymes: No results for input(s): CKTOTAL, CKMB, CKMBINDEX, TROPONINI in the last 168 hours.  BNP: Invalid input(s): POCBNP  CBG: Recent Labs  Lab 02/06/21 1925 02/06/21 2108 02/06/21 2305 02/07/21 0325 02/07/21 0823  GLUCAP 74 96 119* 139* 128*     Microbiology: Results for orders placed or performed during the hospital encounter of 12/28/20  Resp Panel by RT-PCR (Flu A&B, Covid) Nasopharyngeal Swab     Status: None   Collection Time: 12/28/20  4:17 PM   Specimen: Nasopharyngeal Swab; Nasopharyngeal(NP) swabs in vial transport medium  Result Value Ref Range Status   SARS Coronavirus 2 by RT PCR NEGATIVE NEGATIVE Final    Comment: (NOTE) SARS-CoV-2 target nucleic acids are NOT DETECTED.  The SARS-CoV-2 RNA is generally detectable in upper respiratory specimens  during the acute phase of infection. The lowest concentration of SARS-CoV-2 viral copies this assay can detect is 138 copies/mL. A negative result does not preclude SARS-Cov-2 infection and should not be used as the sole basis for treatment or other patient management decisions. A negative result may occur with  improper specimen collection/handling, submission of specimen other than nasopharyngeal swab, presence of viral mutation(s) within the areas targeted by this assay, and inadequate number of viral copies(<138 copies/mL). A negative result must be combined with clinical observations, patient history, and epidemiological information. The expected result is Negative.  Fact Sheet for Patients:  EntrepreneurPulse.com.au  Fact Sheet for Healthcare Providers:  IncredibleEmployment.be  This test is no t yet approved or cleared by the [place] FDA and  has been authorized for detection and/or diagnosis of SARS-CoV-2 by FDA under an Emergency Use Authorization (EUA). This EUA will remain  in effect (meaning this test can be used) for the duration of the COVID-19 declaration under Section 564(b)(1) of the Act, 21 U.S.C.section 360bbb-3(b)(1), unless the authorization is terminated  or revoked sooner.       Influenza A by PCR NEGATIVE NEGATIVE Final   Influenza B by PCR NEGATIVE NEGATIVE Final    Comment: (NOTE) The Xpert Xpress SARS-CoV-2/FLU/RSV plus assay is intended as an aid in the diagnosis of influenza from Nasopharyngeal swab specimens and should not be used as a sole basis for treatment. Nasal washings and aspirates are unacceptable for Xpert Xpress SARS-CoV-2/FLU/RSV testing.  Fact Sheet for Patients: EntrepreneurPulse.com.au  Fact Sheet for Healthcare Providers: IncredibleEmployment.be  This test is not yet approved or cleared by the [place] FDA and has been authorized for detection  and/or diagnosis of SARS-CoV-2 by FDA under an Emergency Use Authorization (EUA). This EUA will remain in effect (meaning this test can be used) for the duration of the COVID-19 declaration under Section 564(b)(1) of the Act, 21 U.S.C. section 360bbb-3(b)(1), unless the authorization is terminated or revoked.  Performed at [facility], [place] [address], [place], [place] [address]   MRSA Next Gen by PCR, Nasal     Status: None   Collection Time: 12/28/20  7:32 PM   Specimen: Nasal Mucosa; Nasal Swab  Result Value Ref Range Status   MRSA by PCR Next Gen NOT DETECTED NOT DETECTED Final    Comment: (NOTE) The GeneXpert MRSA Assay (FDA approved for NASAL specimens only), is one component of a comprehensive MRSA colonization surveillance program.  It is not intended to diagnose MRSA infection nor to guide or monitor treatment for MRSA infections. Test performance is not FDA approved in patients less than 71 years old. Performed at [facility], [place] [address] [place], [place] [address]   Culture, Respiratory w Gram Stain     Status: None   Collection Time: 12/31/20 11:06 AM   Specimen: Tracheal Aspirate; Respiratory  Result Value Ref Range Status   Specimen Description TRACHEAL ASPIRATE  Final   Special Requests NONE  Final   Gram Stain   Final    FEW SQUAMOUS EPITHELIAL CELLS PRESENT FEW WBC PRESENT,BOTH PMN AND MONONUCLEAR FEW GRAM POSITIVE COCCI Performed at [facility], [place] [address], [place], [place] [address]    Culture   Final    FEW PSEUDOMONAS AERUGINOSA FEW STREPTOCOCCUS PNEUMONIAE    Report Status 01/03/2021 FINAL  Final   Organism ID, Bacteria PSEUDOMONAS AERUGINOSA  Final   Organism ID, Bacteria STREPTOCOCCUS PNEUMONIAE  Final      Susceptibility   Pseudomonas aeruginosa - MIC*    CEFTAZIDIME 4 SENSITIVE Sensitive     CIPROFLOXACIN <=0.25 SENSITIVE Sensitive     GENTAMICIN <=1 SENSITIVE Sensitive     IMIPENEM 2 SENSITIVE Sensitive      PIP/TAZO 8 SENSITIVE Sensitive     CEFEPIME 2 SENSITIVE Sensitive     * FEW PSEUDOMONAS AERUGINOSA   Streptococcus pneumoniae - MIC*    ERYTHROMYCIN 4 RESISTANT Resistant     LEVOFLOXACIN 0.5 SENSITIVE Sensitive     VANCOMYCIN <=0.12 SENSITIVE Sensitive     PENO - penicillin <=0.06      PENICILLIN (non-meningitis) <=0.06 SENSITIVE Sensitive     PENICILLIN (oral) <=0.06 SENSITIVE Sensitive     CEFTRIAXONE (non-meningitis) <=0.12 SENSITIVE Sensitive     * FEW STREPTOCOCCUS PNEUMONIAE  Culture, blood (routine x 2)     Status: None   Collection Time: 01/05/21 11:44 AM   Specimen: BLOOD  Result Value Ref Range Status   Specimen Description BLOOD SITE NOT SPECIFIED  Final   Special Requests AEROBIC BOTTLE ONLY Blood Culture adequate volume  Final   Culture   Final    NO GROWTH 5 DAYS Performed at [facility], [address], [place], [place] [address]    Report Status 01/10/2021 FINAL  Final  Culture, blood (routine x 2)     Status: None   Collection Time: 01/05/21 11:44 AM   Specimen: BLOOD  Result Value Ref Range Status   Specimen Description BLOOD SITE NOT SPECIFIED  Final   Special Requests   Final    AEROBIC BOTTLE ONLY Blood Culture results may not be optimal due to an inadequate volume of blood received in culture bottles   Culture   Final    NO GROWTH 5 DAYS Performed at [facility], [place] [address] [place], [place] [address]    Report Status 01/10/2021 FINAL  Final  Surgical PCR screen     Status: None   Collection Time: 01/08/21 12:14 AM   Specimen: Nasal Mucosa; Nasal Swab  Result Value Ref Range Status   MRSA, PCR NEGATIVE NEGATIVE Final   Staphylococcus aureus NEGATIVE NEGATIVE Final    Comment: (NOTE) The Xpert SA Assay (FDA approved for NASAL specimens in patients 71 years of age and older), is one component of a comprehensive surveillance program. It is not intended to diagnose infection nor to guide or monitor treatment. Performed at [facility], [place]  [address], [place], [place] [address]   Culture, Respiratory w Gram Stain     Status: None   Collection Time: 01/08/21  1:24 PM   Specimen: Tracheal Aspirate; Respiratory  Result Value Ref Range Status   Specimen Description TRACHEAL ASPIRATE  Final   Special Requests NONE  Final   Gram Stain   Final    RARE SQUAMOUS EPITHELIAL CELLS PRESENT MODERATE WBC PRESENT, PREDOMINANTLY MONONUCLEAR FEW GRAM NEGATIVE RODS Performed at [facility], [place] [address] [place], [place] [address]    Culture   Final    RARE PSEUDOMONAS AERUGINOSA RARE ENTEROCOCCUS FAECALIS    Report Status 01/11/2021 FINAL  Final   Organism ID, Bacteria PSEUDOMONAS AERUGINOSA  Final   Organism ID, Bacteria ENTEROCOCCUS FAECALIS  Final      Susceptibility   Enterococcus faecalis - MIC*    AMPICILLIN <=2 SENSITIVE Sensitive     VANCOMYCIN 1 SENSITIVE Sensitive     GENTAMICIN SYNERGY SENSITIVE Sensitive     * RARE ENTEROCOCCUS FAECALIS   Pseudomonas aeruginosa - MIC*    CEFTAZIDIME 4 SENSITIVE Sensitive     CIPROFLOXACIN <=0.25 SENSITIVE Sensitive     GENTAMICIN <=1 SENSITIVE Sensitive     IMIPENEM 2 SENSITIVE Sensitive     PIP/TAZO 8 SENSITIVE Sensitive     CEFEPIME 2 SENSITIVE Sensitive     * RARE PSEUDOMONAS AERUGINOSA  Gastrointestinal Panel by PCR , Stool     Status: None   Collection Time: 01/08/21  5:04 PM   Specimen: Stool  Result Value Ref Range Status   Campylobacter species NOT DETECTED NOT DETECTED Final   Plesimonas shigelloides NOT DETECTED NOT DETECTED Final   Salmonella species NOT DETECTED NOT DETECTED Final   Yersinia enterocolitica NOT DETECTED NOT DETECTED Final   Vibrio species NOT DETECTED NOT DETECTED Final   Vibrio cholerae NOT DETECTED NOT DETECTED Final   Enteroaggregative E coli (EAEC) NOT DETECTED NOT DETECTED Final   Enteropathogenic E coli (EPEC) NOT DETECTED NOT DETECTED Final   Enterotoxigenic E coli (ETEC) NOT DETECTED NOT DETECTED Final   Shiga like  toxin producing E coli (STEC) NOT DETECTED NOT DETECTED Final   Shigella/Enteroinvasive E coli (EIEC) NOT DETECTED NOT DETECTED Final   Cryptosporidium NOT DETECTED NOT DETECTED Final   Cyclospora cayetanensis NOT DETECTED NOT DETECTED Final   Entamoeba histolytica NOT DETECTED NOT DETECTED Final   Giardia lamblia NOT DETECTED NOT DETECTED Final   Adenovirus F40/41 NOT DETECTED NOT DETECTED Final   Astrovirus NOT DETECTED NOT DETECTED Final   Norovirus GI/GII NOT DETECTED NOT DETECTED Final   Rotavirus A NOT DETECTED NOT DETECTED Final   Sapovirus (I, II, IV, and V) NOT DETECTED NOT DETECTED Final    Comment: Performed at [facility], [place] [place], [place] [address]  C Difficile Quick Screen (NO PCR Reflex)     Status: None   Collection Time: 01/09/21 11:07 AM   Specimen: STOOL  Result Value Ref Range Status   C Diff antigen NEGATIVE NEGATIVE Final   C Diff toxin NEGATIVE NEGATIVE Final   C Diff interpretation No C. difficile detected.  Final    Comment: Performed at [facility], [place] [address], [place], [place] [address]  Culture, Respiratory w Gram Stain     Status: None   Collection Time: 01/24/21  8:57 AM   Specimen: Tracheal Aspirate; Respiratory  Result Value Ref Range Status   Specimen Description TRACHEAL ASPIRATE  Final   Special Requests NONE  Final   Gram Stain   Final    ABUNDANT WBC PRESENT, PREDOMINANTLY MONONUCLEAR RARE FEW GRAM NEGATIVE RODS    Culture   Final    ABUNDANT PSEUDOMONAS AERUGINOSA Two isolates with different morphologies were identified as the same organism.The most resistant organism was reported. Performed at [facility], [place] [address], [place], [place] [address]    Report Status 01/27/2021 FINAL  Final   Organism ID, Bacteria PSEUDOMONAS AERUGINOSA  Final      Susceptibility   Pseudomonas aeruginosa - MIC*    CEFTAZIDIME 16 INTERMEDIATE Intermediate     CIPROFLOXACIN 1 SENSITIVE Sensitive     GENTAMICIN <=1  SENSITIVE Sensitive     IMIPENEM 2 SENSITIVE Sensitive     * ABUNDANT PSEUDOMONAS AERUGINOSA    Coagulation Studies: No results for input(s): LABPROT, INR in the last 72 hours.  Urinalysis: No results for input(s): COLORURINE, LABSPEC, PHURINE, GLUCOSEU, HGBUR, BILIRUBINUR, KETONESUR, PROTEINUR, UROBILINOGEN, NITRITE, LEUKOCYTESUR in the last 72 hours.  Invalid input(s): APPERANCEUR    Imaging: No results found.   Medications:    sodium chloride     albumin human 25 g (02/06/21 1045)   ceFAZolin     dextrose 5% lactated ringers Stopped (02/04/21 1907)   feeding supplement (NEPRO CARB STEADY) 1,000 mL (02/06/21 0936)    sodium chloride   Intravenous Once   acetaminophen  1,000 mg Per Tube Q6H   amiodarone  200 mg Per Tube Daily   apixaban  5 mg Per Tube BID   chlorhexidine  15 mL Mouth Rinse BID   Chlorhexidine Gluconate Cloth  6 each Topical Q0600   docusate  100 mg Per Tube BID   feeding supplement (PROSource TF)  45 mL Per Tube BID   guaiFENesin  10 mL Per Tube Q4H   insulin aspart  0-20 Units Subcutaneous Q4H   insulin glargine-yfgn  60 Units Subcutaneous BID   mouth rinse  15 mL Mouth Rinse q12n4p   methocarbamol  1,000 mg Per Tube Q8H   metoprolol tartrate  25 mg Per Tube BID   pantoprazole sodium  40 mg Per Tube Daily   polyethylene glycol  17 g Per Tube Daily   QUEtiapine  25 mg Per Tube QHS   senna  1 tablet Per Tube Daily   sodium chloride flush  10-40 mL Intracatheter Q12H   Place/Maintain arterial line **AND** sodium chloride, albumin human, artificial tears, ceFAZolin, fentaNYL, heparin, hydrALAZINE, HYDROmorphone (DILAUDID) injection, midazolam, midazolam, ondansetron **OR** ondansetron (ZOFRAN) IV, oxyCODONE, sodium chloride flush  Assessment/ Plan:   #Acute kidney injury, oliguric: Multifactorial etiology including ischemic ATN in the setting of hypotension, sepsis complicated by contrast injury. CRRT from 9/1-9/9.  No heparin as he is on bivalirudin.   The HD catheter was changed on 9/7.  CRRT restarted on 9/11 given elevated BUN and with more confusion, did not tolerated IHD on 9/10 (confusion, hypotension). CRRT clotted off on 9/13.   -HD 02/06/2021 his next dialysis will be 02/08/2021 - Will need conversion to tunneled dialysis catheter    #Fall/bilateral subarachnoid hemorrhage/SDH, TBI/occipital and temporal bone fracture: Per trauma team.  Repeat CT scan with no acute finding.   #Acute respiratory failure: Status post trach on 8/29, trach collar   #A. fib with RVR: On amiodarone, Angiomax.   # Anemia of critical illness: Transfuse as needed.   #Metabolic acidosis: Managed with dialysis. resolved   #Bilateral pulm embolism: Currently on anticoagulation.   #Acute febrile illness: Per primary team.   #  Hyperkalemia:  K wnl now, managing with HD    LOS: Jakin @TODAY @9 :55 AM

## 2021-02-07 NOTE — Progress Notes (Signed)
Speech Language Pathology Treatment: Dysphagia;Cognitive-Linquistic;Passy Muir Speaking valve  Patient Details Name: Angel [name] MRN: [id number] DOB: 31-Aug-1950 Today's Date: 02/07/2021 Time: 5400-8676 SLP Time Calculation (min) (ACUTE ONLY): 18 min  Assessment / Plan / Recommendation Clinical Impression  Pt interactive, participatory.  PMV placed - VS remained stable throughout use. He achieved improved volume of speech with no cues needed to be understood. Voice still hoarse.  Oriented to  + person,  + year, (-) month, (-) place, (-) reason for hospitalization.  Able to recognize correct responses after review on 3/3 trials. He required verbal cues to search environment for answers to orientation questions.   RN reports 30% intake at breakfast. (Will now be on nocturnal TF).  He asked for water and drank 4 oz over the course of session with verbal cues needed to slow rate - no s/s of aspiration were noted. Prior to leaving and removing PMV, pt asked "Can I get one more drink before the bar closes?" Sense of humor was quite evident today.  SLP will continue to follow for communication/PMV/swallowing.    HPI HPI: Angel [name] is a 70 y.o. 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 CT showed R temporal and parietal SAH, SAH anterior frontal lobes  bilaterally. Also sustained right occipital skull fracture, temporal bone fx, right TM rupture, bilateral PE. Intubated 8/12, trach'd 8/29. PMH: DM2, HTN      SLP Plan  Continue with current plan of care      Recommendations for follow up therapy are one component of a multi-disciplinary discharge planning process, led by the attending physician.  Recommendations may be updated based on patient status, additional functional criteria and insurance authorization.    Recommendations  Diet recommendations: Dysphagia 2 (fine chop);Thin liquid Liquids provided via: Cup;Straw Medication Administration: Via alternative means Supervision: Full  supervision/cueing for compensatory strategies Compensations: Minimize environmental distractions;Multiple dry swallows after each bite/sip;Follow solids with liquid;Small sips/bites;Slow rate      Patient may use Passy-Muir Speech Valve: During all therapies with supervision;During PO intake/meals PMSV Supervision: Full         Oral Care Recommendations: Oral care QID Follow up Recommendations: Inpatient Rehab SLP Visit Diagnosis: Dysphagia, pharyngeal phase (R13.13) Plan: Continue with current plan of care       Buckley Bradly L. Tivis Ringer, [place] Office number [phone] Pager [phone]                Assunta Curtis  02/07/2021, 4:31 PM

## 2021-02-07 NOTE — Progress Notes (Signed)
Nutrition Follow-up  DOCUMENTATION CODES:   Obesity unspecified  INTERVENTION:   Transition to nocturnal tube feeds via Cortrak: - Nepro @ 80 ml/hr x 12 hours from 1800 to 0600 (total of 960 ml) - ProSource TF 45 ml BID  Nocturnal tube feeding regimen provides 1808 kcal, 100 grams of protein, and 698 ml of H2O (meets 72% of kcal needs and 77% of protein needs).  - Encourage PO intake  - Nepro Shake po BID, each supplement provides 425 kcal and 19 grams protein  NUTRITION DIAGNOSIS:   Inadequate oral intake related to inability to eat as evidenced by NPO status.  Progressing, pt now on dysphagia 2 diet with thin liquids  GOAL:   Patient will meet greater than or equal to 90% of their needs  Progressing  MONITOR:   TF tolerance  REASON FOR ASSESSMENT:   Consult, Ventilator Enteral/tube feeding initiation and management  ASSESSMENT:   Pt with PMH of DM and HTN admitted after falling down basement stairs with TBI/SAH/SDH, significant frontal lobe injuries, occipital bone fx, temporal bone fx extending into middle ear, and R TM rupture.  8/15 - s/p Cortrak placement (tip gastric) 8/25 - pt vomited, OG placed with 650 ml out, abd tight  8/26 - extubated but required re-intubation 2 hours later, Cortrak coiled in pt's mouth post extubation and removed, NG tube placed with tip in distal stomach  8/27 - TF resumed 8/29 - s/p trach placement  8/31 - Cortrak placed (tip gastric) 9/01 - CRRT started 9/13 - CRRT stopped 9/14 - first iHD 9/19 - tunneled catheter placed for long-term iHD 9/21 - FEES, diet advanced to dysphagia 2 with thin liquid  Per Nephrology, next iHD treatment planned for 9/23. Last iHD was 9/21. Pt remains on trach collar.  Diet advanced yesterday to dysphagia 2 with thin liquids. PO intake for breakfast today documented as 30%. RD to adjust tube feeds to run nocturnally to allow pt to be disconnected from pump during the day and stimulate appetite and  PO intake. Will also order oral nutrition supplements to aid pt in meeting kcal and protein needs via PO route.  Admit weight: 133.8 kg Current weight: 106.4 kg  Weight has somewhat stabilized over the last 2 weeks. Overall, pt with a 27.4 kg weight loss (20.5%) since admission. This is severe and significant for timeframe. Difficult to determine what portion of weight loss can be attributed to fluid status vs true dry weight loss. However, suspect significant true dry weight loss is present. Recommend continuing nocturnal tube feeds until pt able to consistently demonstrate ability to meet nutritional needs via PO route.  Current TF: Nepro @ 60 ml/hr, ProSource TF 45 ml BID  Medications reviewed and include: colace, SSI q 4 hours, semglee 60 units BID, protonix, miralax, senna  Labs reviewed: hemoglobin 7.5 CBG's: 74-169 x 24 hours  UOP: 325 ml x 24 hours HD net UF: 921 ml on 9/21 I/O's: +5.6 L since admit  Diet Order:   Diet Order             DIET DYS 2 Room service appropriate? Yes with Assist; Fluid consistency: Thin  Diet effective now                   EDUCATION NEEDS:   Not appropriate for education at this time  Skin:  Skin Assessment: Reviewed RN Assessment  Last BM:  02/07/21 small type 7  Height:   Ht Readings from Last 1 Encounters:  01/26/21 6\' 2"  (1.88 m)    Weight:   Wt Readings from Last 1 Encounters:  02/07/21 106.4 kg    BMI:  Body mass index is 30.12 kg/m.  Estimated Nutritional Needs:   Kcal:  2500-2700  Protein:  130-150 grams  Fluid:  > 2 L/day    Gustavus Bryant, MS, RD, LDN Inpatient Clinical Dietitian Please see AMiON for contact information.

## 2021-02-07 NOTE — Progress Notes (Signed)
Physical Therapy Treatment Patient Details Name: Angel [name] MRN: [id number] DOB: 1950-07-25 Today's Date: 02/07/2021   History of Present Illness Angel [name] is a 70 y.o. 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 CT showed R temporal and parietal SAH, SAH anterior frontal lobes  bilaterally. Also sustained right occipital skull fracture, temporal bone fx, right TM rupture, bilateral PE. Intubated 8/12, trach'd 8/29. CRRT 9/1- 9/9.  PMH: DM2, HTN    PT Comments    Patient with limited progress this session.  Seems to have distended abdomen and complaining of pain and noted passing gas.  RN made aware.  Continues with skilled PT needs due to poor activity tolerance, decreased balance, decreased awareness, decreased strength and decreased attention.  He is still requiring up to +2 total A for mobility.  He is will need follow up SNF versus LTACH depending on medical need.  PT to continue to follow acutely.  Goals updated this session.     Recommendations for follow up therapy are one component of a multi-disciplinary discharge planning process, led by the attending physician.  Recommendations may be updated based on patient status, additional functional criteria and insurance authorization.  Follow Up Recommendations  SNF;LTACH     Equipment Recommendations  Other (comment) (TBA)    Recommendations for Other Services       Precautions / Restrictions Precautions Precautions: Fall Precaution Comments: trach collar, cortrak, watch BP HR     Mobility  Bed Mobility Overal bed mobility: Needs Assistance Bed Mobility: Rolling;Sidelying to Sit;Sit to Supine Rolling: Max assist;+2 for physical assistance Sidelying to sit: Max assist;+2 for physical assistance   Sit to supine: +2 for physical assistance;Total assist   General bed mobility comments: assist for rolling to change brief; side to sit with A for legs off bed and trunk upright; pt not helping much, but encouraged and  hand placed on foot board for balance/positioning once upright; grimacing in sitting and not tolerating upright well so assisted to supine with total A.    Transfers Overall transfer level: Needs assistance   Transfers: Lateral/Scoot Transfers          Lateral/Scoot Transfers: Total assist General transfer comment: attempted to engage pt in anterior weight shift and lateral scoot to [facility], but after 3 tries still not participating  Ambulation/Gait                 Stairs             Wheelchair Mobility    Modified Rankin (Stroke Patients Only)       Balance Overall balance assessment: Needs assistance   Sitting balance-Leahy Scale: Poor Sitting balance - Comments: A for sitting balance despite placing pt's hand on footboard and cues for keeping balance, he wants to return to supine, only sat about 2 minutes on EOB                                    Cognition Arousal/Alertness: Lethargic Behavior During Therapy: Flat affect Overall Cognitive Status: Difficult to assess Area of Impairment: Attention;Following commands;Rancho level               Rancho Levels of Cognitive Functioning Rancho Los Amigos Scales of Cognitive Functioning: Confused/appropriate   Current Attention Level: Focused   Following Commands: Follows one step commands inconsistently       General Comments: did not don PMV with lot of  secretions, pt spoke over trach x 1 to say "I want to lie down", needed redirection and encouragement for sitting EOB      Exercises      General Comments        Pertinent Vitals/Pain Pain Assessment: Faces Pain Score: 7  Faces Pain Scale: Hurts even more Pain Location: abdomen Pain Descriptors / Indicators: Discomfort;Grimacing;Guarding Pain Intervention(s): Monitored during session;Limited activity within patient's tolerance;Repositioned    Home Living                      Prior Function            PT Goals  (current goals can now be found in the care plan section) Acute Rehab PT Goals Patient Stated Goal: to lie down PT Goal Formulation: Patient unable to participate in goal setting Time For Goal Achievement: 02/21/21 Potential to Achieve Goals: Fair Progress towards PT goals: Not progressing toward goals - comment    Frequency    Min 3X/week      PT Plan Current plan remains appropriate    Co-evaluation              AM-PAC PT "6 Clicks" Mobility   Outcome Measure  Help needed turning from your back to your side while in a flat bed without using bedrails?: Total Help needed moving from lying on your back to sitting on the side of a flat bed without using bedrails?: Total Help needed moving to and from a bed to a chair (including a wheelchair)?: Total Help needed standing up from a chair using your arms (e.g., wheelchair or bedside chair)?: Total Help needed to walk in hospital room?: Total Help needed climbing 3-5 steps with a railing? : Total 6 Click Score: 6    End of Session Equipment Utilized During Treatment: Oxygen (trach collar) Activity Tolerance: Patient limited by pain Patient left: in bed;with call bell/phone within reach;with bed alarm set   PT Visit Diagnosis: Unsteadiness on feet (R26.81);Muscle weakness (generalized) (M62.81);Difficulty in walking, not elsewhere classified (R26.2)     Time: 1445-1510 PT Time Calculation (min) (ACUTE ONLY): 25 min  Charges:  $Therapeutic Activity: 23-37 mins                     Magda Kiel, PT Acute Rehabilitation Services PNTIR:[phone] Office:[phone] 02/07/2021    Reginia Naas 02/07/2021, 5:56 PM

## 2021-02-07 NOTE — Plan of Care (Signed)

## 2021-02-07 NOTE — TOC Progression Note (Addendum)
Transition of Care [facility]) - Progression Note    Patient Details  Name: Angel [name] MRN: [id number] Date of Birth: September 11, 1950  [facility] of [facility]) CM/SW Contact  Oren Section Cleta Alberts, RN Phone Number: 02/07/2021, 12:00 PM  Clinical Narrative:    No dialysis bed available at [facility] currently.  Facility to notify Trauma Case Manager upon HD bed availability.  Met with pt's wife to discuss lack of HD bed at [facility]; she does not want to consider [place].     Expected Discharge Plan: Long Term Acute Care (LTAC) Barriers to Discharge: Continued Medical Work up  Expected Discharge Plan and Services Expected Discharge Plan: [place] (LTAC)   Discharge Planning Services: CM Consult   Living arrangements for the past 2 months: Single Family Home                                       Social Determinants of Health (SDOH) Interventions    Readmission Risk Interventions No flowsheet data found.  Reinaldo Raddle, RN, BSN  Trauma/Neuro ICU Case Manager [phone]

## 2021-02-07 NOTE — Progress Notes (Signed)
Patient ID: Angel [name], male   DOB: 1950-09-10, 70 y.o.   MRN: [id number] 24 Days Post-Op   Subjective: Sitting up eating breakfast with assistance, no new complaints. Denies pain. Did convert back into a.fib overnight.   ROS negative except as listed above. Objective: Vital signs in last 24 hours: Temp:  [98.5 F (36.9 C)-99.7 F (37.6 C)] 99 F (37.2 C) (09/22 0827) Pulse Rate:  [58-99] 99 (09/22 1003) Resp:  [14-20] 18 (09/22 0829) BP: (73-131)/(47-90) 131/69 (09/22 1003) SpO2:  [94 %-100 %] 95 % (09/22 0827) FiO2 (%):  [21 %] 21 % (09/22 0829) Weight:  [106.4 kg-108.1 kg] 106.4 kg (09/22 0419) Last BM Date: 02/06/21  Intake/Output from previous day: 09/21 0701 - 09/22 0700 In: -  Out: 1246 [Urine:325] Intake/Output this shift: Total I/O In: 240 [P.O.:240] Out: -   General appearance: chronically ill, cooperative Resp: trach with PMV in place, clear to auscultation bilaterally Cardio: RRR, HR in 80's-90's during my exam, irregular  GI: soft, obese, non-tender; bowel sounds normal; no masses, no organomegaly Extremities: less edema Neurologic: Mental status: alert, F/C MAE  Lab Results: CBC  Recent Labs    02/06/21 0426 02/07/21 0430  WBC 6.6 7.2  HGB 7.7* 7.5*  HCT 25.4* 24.2*  PLT 356 323   BMET Recent Labs    02/06/21 1720 02/07/21 0430  NA 134* 135  K 3.8 3.7  CL 97* 96*  CO2 26 26  GLUCOSE 128* 121*  BUN 48* 66*  CREATININE 1.50* 1.94*  CALCIUM 8.7* 9.1   PT/INR No results for input(s): LABPROT, INR in the last 72 hours. ABG No results for input(s): PHART, HCO3 in the last 72 hours.  Invalid input(s): PCO2, PO2  Studies/Results: No results found.  Anti-infectives: Anti-infectives (From admission, onward)    Start     Dose/Rate Route Frequency Ordered Stop   02/04/21 1548  ceFAZolin (ANCEF) IVPB 2g/100 mL premix        over 30 Minutes  Continuous PRN 02/04/21 1549     02/04/21 1545  ceFAZolin (ANCEF) IVPB 1 g/50 mL premix        1  g 100 mL/hr over 30 Minutes Intravenous  Once 02/04/21 1458 02/04/21 1600   02/04/21 1543  ceFAZolin (ANCEF) 2-4 GM/100ML-% IVPB       Note to Pharmacy: Lytle Butte   : cabinet override      02/04/21 1543 02/04/21 1651   01/11/21 2330  ceFEPIme (MAXIPIME) 2 g in sodium chloride 0.9 % 100 mL IVPB  Status:  Discontinued        2 g 200 mL/hr over 30 Minutes Intravenous Every 24 hours 01/11/21 0711 01/16/21 0907   01/10/21 1645  ampicillin (OMNIPEN) 2 g in sodium chloride 0.9 % 100 mL IVPB  Status:  Discontinued        2 g 300 mL/hr over 20 Minutes Intravenous Every 8 hours 01/10/21 1549 01/16/21 0907   01/09/21 2200  ceFEPIme (MAXIPIME) 2 g in sodium chloride 0.9 % 100 mL IVPB  Status:  Discontinued        2 g 200 mL/hr over 30 Minutes Intravenous Every 12 hours 01/09/21 1458 01/11/21 0711   01/08/21 1515  metroNIDAZOLE (FLAGYL) IVPB 500 mg  Status:  Discontinued        500 mg 100 mL/hr over 60 Minutes Intravenous Every 8 hours 01/08/21 1428 01/10/21 1618   01/03/21 0600  vancomycin (VANCOREADY) IVPB 1250 mg/250 mL  Status:  Discontinued  1,250 mg 166.7 mL/hr over 90 Minutes Intravenous Every 12 hours 01/02/21 1717 01/03/21 0837   01/02/21 1800  vancomycin (VANCOREADY) IVPB 2000 mg/400 mL        2,000 mg 200 mL/hr over 120 Minutes Intravenous  Once 01/02/21 1712 01/02/21 2007   01/02/21 0900  ceFEPIme (MAXIPIME) 2 g in sodium chloride 0.9 % 100 mL IVPB  Status:  Discontinued        2 g 200 mL/hr over 30 Minutes Intravenous Every 8 hours 01/02/21 0849 01/09/21 1458       Assessment/Plan: Fall down stairs 8/12   VDRF - guaifenisen, S/P trach 8/29 by Dr. Bobbye Morton. Has tolerated HTC well, passy muir trials.  Trach changed to cuffless #6 on 9/20. Hope to downsize to #4 cuffless once secretions improve, hopefully later this week or early next. ID - off abx, no fevers TBI/SAH/SDH - NSGY c/s, Dr. Annette Stable. Significant frontal lobe injuries. Keppra x7d for sz ppx (completed) Occipital bone  fx - NSGY c/s, Dr. Annette Stable Temporal bone fx extending into middle ear - ENT c/s, Dr. Constance Holster, no acute treatment, will need re-eval hearing and facial nerve  Right TM Rupture - ENT c/s, Dr. Phoebe Sharps -  per Cardiology, IV amio transitioned to 200 mg amio per tube today 9/21. lopressor added per Cardiology 9/17, increased to 25 mg TID today. Qtc 476 from 541, improved, monitor ABL anemia- 1u PRBC 9/19, Hb 7.7 from 8.7, monitor Bilateral pulmonary embolism - Eliquis AKI - HD per Renal, IR placed tunneled HD cath 9/19 Hx DM2 - resistant SSI, novolog q4, glargine 60u BID Hx HTN - PRN meds FEN - tolerating TF,  FEES 9/21 >> now on DYS2 diet, will discuss switching to nocturnal TF with RD while we monitor PO intake.  VTE - SCDs, Eliquis Dispo - 4NP, HTC, therapies, DYS 2 diet w/ nocturnal tube feeds, monitor QTC   LOS: 41 days    Obie Dredge, PA-C  Trauma & General Surgery Use [email or web address] to contact on call provider  02/07/2021

## 2021-02-07 NOTE — Progress Notes (Signed)
Progress Note  Patient Name: Angel [name] Date of Encounter: 02/07/2021  Primary Cardiologist:   Werner Lean, MD   Subjective   Converted back to Afib.  Denies any chest pain or dyspnea.  Inpatient Medications    Scheduled Meds:  sodium chloride   Intravenous Once   acetaminophen  1,000 mg Per Tube Q6H   amiodarone  200 mg Per Tube Daily   apixaban  5 mg Per Tube BID   chlorhexidine  15 mL Mouth Rinse BID   Chlorhexidine Gluconate Cloth  6 each Topical Q0600   Chlorhexidine Gluconate Cloth  6 each Topical Q0600   docusate  100 mg Per Tube BID   feeding supplement (PROSource TF)  45 mL Per Tube BID   guaiFENesin  10 mL Per Tube Q4H   insulin aspart  0-20 Units Subcutaneous Q4H   insulin glargine-yfgn  60 Units Subcutaneous BID   mouth rinse  15 mL Mouth Rinse q12n4p   methocarbamol  1,000 mg Per Tube Q8H   metoprolol tartrate  25 mg Per Tube BID   pantoprazole sodium  40 mg Per Tube Daily   polyethylene glycol  17 g Per Tube Daily   QUEtiapine  25 mg Per Tube QHS   senna  1 tablet Per Tube Daily   sodium chloride flush  10-40 mL Intracatheter Q12H   Continuous Infusions:  sodium chloride     albumin human 25 g (02/06/21 1045)   ceFAZolin     dextrose 5% lactated ringers Stopped (02/04/21 1907)   feeding supplement (NEPRO CARB STEADY) 1,000 mL (02/06/21 0936)   PRN Meds: Place/Maintain arterial line **AND** sodium chloride, albumin human, artificial tears, ceFAZolin, fentaNYL, heparin, hydrALAZINE, HYDROmorphone (DILAUDID) injection, midazolam, midazolam, ondansetron **OR** ondansetron (ZOFRAN) IV, oxyCODONE, sodium chloride flush   Vital Signs    Vitals:   02/07/21 0419 02/07/21 0427 02/07/21 0827 02/07/21 0829  BP:   106/70   Pulse:   79   Resp:   19 18  Temp:   99 F (37.2 C)   TempSrc:   Oral   SpO2:  95% 95%   Weight: 106.4 kg     Height:        Intake/Output Summary (Last 24 hours) at 02/07/2021 1002 Last data filed at 02/07/2021  0921 Gross per 24 hour  Intake 240 ml  Output 1246 ml  Net -1006 ml    Filed Weights   02/06/21 1017 02/06/21 1423 02/07/21 0419  Weight: 109 kg 108.1 kg 106.4 kg    Telemetry   Converted to Afib this morning, rates 100-120s- Personally Reviewed  ECG    Normal sinus rhythm with PACs, rate 85, QTc 476 personally Reviewed  Physical Exam   GEN: ill appearing, trached HEENT: Normal NECK: difficult to assess JVD due to body habitus.  Trach present CARDIAC: Regular rate and rhythm, no murmurs, rubs, gallops RESPIRATORY:  Clear to auscultation without rales, wheezing or rhonchi  ABDOMEN: Soft, non-tender, non-distended MUSCULOSKELETAL:  No edema SKIN: Warm and dry NEUROLOGIC:  alert, answers questions PSYCHIATRIC: cannot assess  Labs    Chemistry Recent Labs  Lab 02/06/21 0427 02/06/21 1720 02/07/21 0430  NA 132* 134* 135  K 3.1* 3.8 3.7  CL 96* 97* 96*  CO2 22 26 26   GLUCOSE 153* 128* 121*  BUN 108* 48* 66*  CREATININE 2.44* 1.50* 1.94*  CALCIUM 8.9 8.7* 9.1  ALBUMIN 2.2* 2.7* 2.6*  GFRNONAA 28* 50* 37*  ANIONGAP 14 11 13  Hematology Recent Labs  Lab 02/05/21 0010 02/06/21 0426 02/07/21 0430  WBC 6.9 6.6 7.2  RBC 3.26* 2.95* 2.86*  HGB 8.7* 7.7* 7.5*  HCT 27.3* 25.4* 24.2*  MCV 83.7 86.1 84.6  MCH 26.7 26.1 26.2  MCHC 31.9 30.3 31.0  RDW 17.7* 17.7* 17.6*  PLT 323 356 323     Cardiac EnzymesNo results for input(s): TROPONINI in the last 168 hours. No results for input(s): TROPIPOC in the last 168 hours.   BNPNo results for input(s): BNP, PROBNP in the last 168 hours.   DDimer No results for input(s): DDIMER in the last 168 hours.   Radiology    No results found.  Cardiac Studies   Echo 01/07/21: 1. Left ventricular ejection fraction, by estimation, is 60 to 65%. The  left ventricle has normal function. The left ventricle has no regional  wall motion abnormalities. There is mild left ventricular hypertrophy.  Left ventricular  diastolic parameters  are indeterminate.   2. Right ventricule is poorly visualized but grossly normal size and  systolic function   3. Left atrial size was mildly dilated.   4. Right atrial size was mildly dilated.   5. The mitral valve is normal in structure. No evidence of mitral valve  regurgitation. No evidence of mitral stenosis.   6. The aortic valve was not well visualized. Aortic valve regurgitation  is not visualized. No aortic stenosis is present.   Patient Profile     70 y.o. male with a PMH of hyperlipidemia, DM type II, who presented with fall resulting in subarachnoid hemorrhage requiring intubation with hospital course complicated by Multi lobar pneumonia, PE, sepsis, AKI requiring CRRT, and new onset atrial fibrillation/flutter for which cardiology is following.  Assessment & Plan    New onset paroxysmal atrial fibrillation  -Started on Eliquis, also with bilateral pulmonary embolism.  Hgb trending down slowly, was 6.7 on 9/19 and given 1 unit PRBCs.  Will need to monitor closely on anticoagulation -Converted to normal sinus rhythm 9/20 but back in AF 9/22.  Continue metoprolol 25 mg, will increase to TID.  Continue amiodarone 200 mg BID.  Hypoxic respiratory failure -Hospital course complicated by multilobar PNA and bilateral PE -Continue vent management per primary team.   AKI -Was on CRRT, now HD due to issues with CRRT  HTN -BP normal  -continue metoprolol   QT prolongation -QTC 541 on EKG 9/21.  We will need to monitor as on amiodarone.  Suspect hypokalemia contributing, K 3.1.  K repleted, QTC improved to 476 on EKG today   For questions or updates, please contact [place] Please consult [email or web address] for contact info under Cardiology/STEMI.   Donato Heinz, MD  02/07/2021, 10:02 AM

## 2021-02-08 DIAGNOSIS — Z20822 Contact with and (suspected) exposure to covid-19: Secondary | ICD-10-CM | POA: Diagnosis not present

## 2021-02-08 DIAGNOSIS — S066X9A Traumatic subarachnoid hemorrhage with loss of consciousness of unspecified duration, initial encounter: Secondary | ICD-10-CM | POA: Diagnosis not present

## 2021-02-08 DIAGNOSIS — I4891 Unspecified atrial fibrillation: Secondary | ICD-10-CM | POA: Diagnosis not present

## 2021-02-08 DIAGNOSIS — J9601 Acute respiratory failure with hypoxia: Secondary | ICD-10-CM | POA: Diagnosis not present

## 2021-02-08 DIAGNOSIS — I2699 Other pulmonary embolism without acute cor pulmonale: Secondary | ICD-10-CM | POA: Diagnosis not present

## 2021-02-08 LAB — RENAL FUNCTION PANEL
Albumin: 2.4 g/dL — ABNORMAL LOW (ref 3.5–5.0)
Anion gap: 11 (ref 5–15)
BUN: 89 mg/dL — ABNORMAL HIGH (ref 8–23)
CO2: 26 mmol/L (ref 22–32)
Calcium: 8.9 mg/dL (ref 8.9–10.3)
Chloride: 95 mmol/L — ABNORMAL LOW (ref 98–111)
Creatinine, Ser: 2 mg/dL — ABNORMAL HIGH (ref 0.61–1.24)
GFR, Estimated: 35 mL/min — ABNORMAL LOW (ref >60)
Glucose, Bld: 121 mg/dL — ABNORMAL HIGH (ref 70–99)
Phosphorus: 4.2 mg/dL (ref 2.5–4.6)
Potassium: 3.2 mmol/L — ABNORMAL LOW (ref 3.5–5.1)
Sodium: 132 mmol/L — ABNORMAL LOW (ref 135–145)

## 2021-02-08 LAB — GLUCOSE, CAPILLARY
Glucose-Capillary: 155 mg/dL — ABNORMAL HIGH (ref 70–99)
Glucose-Capillary: 58 mg/dL — ABNORMAL LOW (ref 70–99)
Glucose-Capillary: 88 mg/dL (ref 70–99)
Glucose-Capillary: 152 mg/dL — ABNORMAL HIGH (ref 70–99)
Glucose-Capillary: 104 mg/dL — ABNORMAL HIGH (ref 70–99)
Glucose-Capillary: 90 mg/dL (ref 70–99)

## 2021-02-08 LAB — CBC
HCT: 23.2 % — ABNORMAL LOW (ref 39.0–52.0)
Hemoglobin: 7.3 g/dL — ABNORMAL LOW (ref 13.0–17.0)
MCH: 26.6 pg (ref 26.0–34.0)
MCHC: 31.5 g/dL (ref 30.0–36.0)
MCV: 84.7 fL (ref 80.0–100.0)
Platelets: 348 10*3/uL (ref 150–400)
RBC: 2.74 MIL/uL — ABNORMAL LOW (ref 4.22–5.81)
RDW: 17.3 % — ABNORMAL HIGH (ref 11.5–15.5)
WBC: 5.9 10*3/uL (ref 4.0–10.5)
nRBC: 0 % (ref 0.0–0.2)

## 2021-02-08 LAB — MAGNESIUM: Magnesium: 2.2 mg/dL (ref 1.7–2.4)

## 2021-02-08 MED ORDER — ALTEPLASE 2 MG IJ SOLR: 2.0000 mg | Freq: Once | INTRAMUSCULAR | Status: DC | PRN

## 2021-02-08 MED ORDER — PENTAFLUOROPROP-TETRAFLUOROETH EX AERO: 1.0000 "application " | INHALATION_SPRAY | CUTANEOUS | Status: DC | PRN

## 2021-02-08 MED ORDER — GUAIFENESIN 100 MG/5ML PO SOLN
15.0000 mL | ORAL | Status: DC
  Administered 2021-02-08 – 2021-02-12 (×23): 300 mg
  Filled 2021-02-08: qty 20
  Filled 2021-02-08 (×3): qty 10
  Filled 2021-02-08: qty 20
  Filled 2021-02-08: qty 10
  Filled 2021-02-08: qty 20
  Filled 2021-02-08: qty 10
  Filled 2021-02-08 (×5): qty 20
  Filled 2021-02-08 (×2): qty 10
  Filled 2021-02-08 (×6): qty 20
  Filled 2021-02-08: qty 10
  Filled 2021-02-08 (×4): qty 20

## 2021-02-08 MED ORDER — POTASSIUM CHLORIDE 10 MEQ/100ML IV SOLN
10.0000 meq | INTRAVENOUS | Status: DC
Start: 2021-02-08 — End: 2021-02-08
  Administered 2021-02-08: 10 meq via INTRAVENOUS
  Filled 2021-02-08 (×2): qty 100

## 2021-02-08 MED ORDER — POTASSIUM CHLORIDE 10 MEQ/100ML IV SOLN
10.0000 meq | INTRAVENOUS | Status: AC
  Administered 2021-02-08 (×2): 10 meq via INTRAVENOUS

## 2021-02-08 MED ORDER — POTASSIUM CHLORIDE 10 MEQ/100ML IV SOLN
10.0000 meq | INTRAVENOUS | Status: AC
Start: 2021-02-08 — End: 2021-02-08
  Administered 2021-02-08 (×3): 10 meq via INTRAVENOUS
  Filled 2021-02-08 (×4): qty 100

## 2021-02-08 MED ORDER — SODIUM CHLORIDE 0.9 % IV SOLN: 100.0000 mL | INTRAVENOUS | Status: DC | PRN

## 2021-02-08 MED ORDER — AMIODARONE HCL 200 MG PO TABS
200.0000 mg | ORAL_TABLET | Freq: Every day | ORAL | Status: DC
  Administered 2021-02-16 – 2021-03-01 (×13): 200 mg via ORAL
  Filled 2021-02-08 (×15): qty 1

## 2021-02-08 MED ORDER — LIDOCAINE-PRILOCAINE 2.5-2.5 % EX CREA: 1.0000 "application " | TOPICAL_CREAM | CUTANEOUS | Status: DC | PRN

## 2021-02-08 MED ORDER — HEPARIN SODIUM (PORCINE) 1000 UNIT/ML DIALYSIS: 1000.0000 [IU] | INTRAMUSCULAR | Status: DC | PRN

## 2021-02-08 MED ORDER — LIDOCAINE HCL (PF) 1 % IJ SOLN: 5.0000 mL | INTRAMUSCULAR | Status: DC | PRN

## 2021-02-08 MED ORDER — AMIODARONE HCL 200 MG PO TABS: 200.0000 mg | ORAL_TABLET | Freq: Every day | ORAL | Status: DC

## 2021-02-08 MED ORDER — METOPROLOL TARTRATE 25 MG/10 ML ORAL SUSPENSION
25.0000 mg | Freq: Two times a day (BID) | ORAL | Status: DC
  Administered 2021-02-08 – 2021-02-10 (×4): 25 mg
  Filled 2021-02-08 (×8): qty 10

## 2021-02-08 MED ORDER — AMIODARONE HCL 200 MG PO TABS
200.0000 mg | ORAL_TABLET | Freq: Two times a day (BID) | ORAL | Status: AC
  Administered 2021-02-08 – 2021-02-14 (×10): 200 mg via ORAL
  Filled 2021-02-08 (×12): qty 1

## 2021-02-08 MED ORDER — AMIODARONE HCL 200 MG PO TABS: 200.0000 mg | ORAL_TABLET | Freq: Two times a day (BID) | ORAL | Status: DC

## 2021-02-08 NOTE — Progress Notes (Signed)
Brief Nutrition Note  Consult received for 48-hour calorie count. Discussed with RN via phone call. RN has hung calorie count envelope on the patient's door. RN to document percent consumed for each item on the patient's meal tray ticket and keep in envelope. RN will also document percent of any supplement or snack pt consumes and keep documentation in envelope for RD to review.   Recommend continuing nocturnal tube feeds as ordered (see RD note from 9/22). RD will follow up with results from 48-hour calorie count on Monday, 02/11/21.   Gustavus Bryant, MS, RD, LDN Inpatient Clinical Dietitian Please see AMiON for contact information.

## 2021-02-08 NOTE — Progress Notes (Signed)
SLP Cancellation Note  Patient Details Name: Angel [name] MRN: [id number] DOB: 1950/11/26   Cancelled treatment:   Pt at HD. F/u next week.  Vinetta Brach L. Tivis Ringer, [place] Office number [phone] Pager [phone]                                                                                                     Assunta Curtis 02/08/2021, 3:33 PM

## 2021-02-08 NOTE — Progress Notes (Addendum)
Patient ID: Angel [name], male   DOB: 1950-12-27, 70 y.o.   MRN: [id number] 25 Days Post-Op   Subjective: Sitting up in bed comfortably. No new complaints and denies any needs at time of my evaluation  ROS negative except as listed above. Objective: Vital signs in last 24 hours: Temp:  [97.7 F (36.5 C)-98.7 F (37.1 C)] 98.7 F (37.1 C) (09/23 0825) Pulse Rate:  [65-122] 65 (09/23 0825) Resp:  [14-19] 18 (09/23 0825) BP: (103-131)/(56-69) 113/65 (09/23 0825) SpO2:  [89 %-97 %] 95 % (09/23 0825) FiO2 (%):  [21 %] 21 % (09/23 0416) Weight:  [735 kg] 109 kg (09/23 0555) Last BM Date: 02/06/21  Intake/Output from previous day: 09/22 0701 - 09/23 0700 In: 1097 [P.O.:360; NG/GT:737] Out: 300 [Urine:300] Intake/Output this shift: No intake/output data recorded.  General appearance: chronically ill, cooperative Resp: trach with PMV in place, clear to auscultation bilaterally Cardio: RRR, HR in 60s during my exam GI: soft, obese, non-tender; bowel sounds normal; no masses, no organomegaly Extremities: less edema Neurologic: Mental status: alert, F/C MAE  Lab Results: CBC  Recent Labs    02/07/21 0430 02/08/21 0410  WBC 7.2 5.9  HGB 7.5* 7.3*  HCT 24.2* 23.2*  PLT 323 348    BMET Recent Labs    02/07/21 0430 02/08/21 0410  NA 135 132*  K 3.7 3.2*  CL 96* 95*  CO2 26 26  GLUCOSE 121* 121*  BUN 66* 89*  CREATININE 1.94* 2.00*  CALCIUM 9.1 8.9    PT/INR No results for input(s): LABPROT, INR in the last 72 hours. ABG No results for input(s): PHART, HCO3 in the last 72 hours.  Invalid input(s): PCO2, PO2  Studies/Results: No results found.  Anti-infectives: Anti-infectives (From admission, onward)    Start     Dose/Rate Route Frequency Ordered Stop   02/04/21 1548  ceFAZolin (ANCEF) IVPB 2g/100 mL premix        over 30 Minutes  Continuous PRN 02/04/21 1549     02/04/21 1545  ceFAZolin (ANCEF) IVPB 1 g/50 mL premix        1 g 100 mL/hr over 30 Minutes  Intravenous  Once 02/04/21 1458 02/04/21 1600   02/04/21 1543  ceFAZolin (ANCEF) 2-4 GM/100ML-% IVPB       Note to Pharmacy: Lytle Butte   : cabinet override      02/04/21 1543 02/04/21 1651   01/11/21 2330  ceFEPIme (MAXIPIME) 2 g in sodium chloride 0.9 % 100 mL IVPB  Status:  Discontinued        2 g 200 mL/hr over 30 Minutes Intravenous Every 24 hours 01/11/21 0711 01/16/21 0907   01/10/21 1645  ampicillin (OMNIPEN) 2 g in sodium chloride 0.9 % 100 mL IVPB  Status:  Discontinued        2 g 300 mL/hr over 20 Minutes Intravenous Every 8 hours 01/10/21 1549 01/16/21 0907   01/09/21 2200  ceFEPIme (MAXIPIME) 2 g in sodium chloride 0.9 % 100 mL IVPB  Status:  Discontinued        2 g 200 mL/hr over 30 Minutes Intravenous Every 12 hours 01/09/21 1458 01/11/21 0711   01/08/21 1515  metroNIDAZOLE (FLAGYL) IVPB 500 mg  Status:  Discontinued        500 mg 100 mL/hr over 60 Minutes Intravenous Every 8 hours 01/08/21 1428 01/10/21 1618   01/03/21 0600  vancomycin (VANCOREADY) IVPB 1250 mg/250 mL  Status:  Discontinued        1,250  mg 166.7 mL/hr over 90 Minutes Intravenous Every 12 hours 01/02/21 1717 01/03/21 0837   01/02/21 1800  vancomycin (VANCOREADY) IVPB 2000 mg/400 mL        2,000 mg 200 mL/hr over 120 Minutes Intravenous  Once 01/02/21 1712 01/02/21 2007   01/02/21 0900  ceFEPIme (MAXIPIME) 2 g in sodium chloride 0.9 % 100 mL IVPB  Status:  Discontinued        2 g 200 mL/hr over 30 Minutes Intravenous Every 8 hours 01/02/21 0849 01/09/21 1458       Assessment/Plan: Fall down stairs 8/12   VDRF - guaifenisen, S/P trach 8/29 by Dr. Bobbye Morton. Has tolerated HTC well, passy muir trials.  Trach changed to cuffless #6 on 9/20. Hope to downsize to #4 cuffless today ID - off abx, no fevers TBI/SAH/SDH - NSGY c/s, Dr. Annette Stable. Significant frontal lobe injuries. Keppra x7d for sz ppx (completed) Occipital bone fx - NSGY c/s, Dr. Annette Stable Temporal bone fx extending into middle ear - ENT c/s, Dr.  Constance Holster, no acute treatment, will need re-eval hearing and facial nerve  Right TM Rupture - ENT c/s, Dr. Phoebe Sharps -  per Cardiology, IV amio transitioned to 200 mg amio per tube 9/21. lopressor added per Cardiology 9/17, increased to 25 mg TID 9/22. Qtc 419 from 476 9/23, improving, monitor ABL anemia- 1u PRBC 9/19, Hb 7.3 from 7.5, monitor Bilateral pulmonary embolism - Eliquis AKI - HD per Renal, IR placed tunneled HD cath 9/19. HD today 9/23 Hx DM2 - resistant SSI, novolog q4, glargine 60u BID Hx HTN - PRN meds FEN - tolerating TF,  FEES 9/21 >> now on DYS2 diet, replete K IV, nocturnal TF and calorie count VTE - SCDs, Eliquis Dispo - HTC, therapies, DYS 2 diet w/ nocturnal tube feeds, monitor QTC, SNF/LTACH   LOS: 42 days    Winferd Humphrey, [facility] Surgery 02/08/2021, 12:02 PM Please see Amion for pager number during day hours 7:00am-4:30pm   02/08/2021

## 2021-02-08 NOTE — Progress Notes (Signed)
OT Cancellation Note  Patient Details Name: Angel [name] MRN: [id number] DOB: Nov 06, 1950   Cancelled Treatment:    Reason Eval/Treat Not Completed: Patient at procedure or test/ unavailable (HD) Will follow up next week. Varick Keys,H[place] 02/08/2021, 2:15 PM Maurie Boettcher, OT/L   Acute OT Clinical Specialist Acute Rehabilitation Services Pager [phone] Office [phone]

## 2021-02-08 NOTE — Progress Notes (Signed)
Palliative Medicine RN Note: Discussed pt in rounds.   GOC remain very clear, and TOC is working on d/c plan. At this time, PMT has nothing more to add to Angel [name]'s care.  If the family requests further PMT involvement, please re-consult [place] and call our office at [phone]. We will sign off.  Marjie Skiff Sayana Salley, RN, BSN, [facility] Palliative Medicine Team 02/08/2021 10:53 AM Office [phone]

## 2021-02-08 NOTE — Progress Notes (Signed)
[facility] ROUNDING NOTE   Subjective:   Interval History: This is a 70 year old gentleman who presented 12/28/2020 with a traumatic brain injury subarachnoid hemorrhage subdural hemorrhage temporal bone fracture extending into the middle area occipital bone fracture and right tympanic membrane rupture.  Hospital course was complicated by pulmonary embolus 01/09/2021.  He had acute kidney injury and was started on CVVHD 01/18/2021.  He was transitioned to acute intermittent hemodialysis.  He does not appear to have recovery of renal function.  His last dialysis was 02/06/2021 with 1.2 L removed.  His next dialysis treatment will be 02/08/2021  Blood pressure 112/60 pulse 70 temperature 98.4 O2 sats 95% 21% FiO2  Sodium 132 potassium 3.2 chloride 95 CO2 26 BUN 89 creatinine 2 glucose 121 hemoglobin 7.3  Objective:  Vital signs in last 24 hours:  Temp:  [97.7 F (36.5 C)-99 F (37.2 C)] 98.4 F (36.9 C) (09/23 0310) Pulse Rate:  [71-122] 72 (09/23 0310) Resp:  [14-19] 18 (09/23 0310) BP: (103-131)/(56-70) 120/65 (09/23 0310) SpO2:  [89 %-97 %] 95 % (09/23 0416) FiO2 (%):  [21 %] 21 % (09/23 0416) Weight:  [793 kg] 109 kg (09/23 0555)  Weight change: 0 kg Filed Weights   02/06/21 1423 02/07/21 0419 02/08/21 0555  Weight: 108.1 kg 106.4 kg 109 kg    Intake/Output: I/O last 3 completed shifts: In: 9030 [P.O.:360; NG/GT:737] Out: 625 [Urine:625]   Intake/Output this shift:  No intake/output data recorded.  General: nad, tracheostomy Heart: rrr Lungs: trach, cta bl Abdomen:soft, nontender. Extremities: trace dependent edema. Neurology:Alert awake  Dialysis Access: Left subclavian temporary HD catheter placed on 9/7   Basic Metabolic Panel: Recent Labs  Lab 02/04/21 0313 02/04/21 1705 02/05/21 0010 02/05/21 1550 02/06/21 0426 02/06/21 0427 02/06/21 1720 02/07/21 0430 02/08/21 0410  NA 134*   < > 134* 134*  --  132* 134* 135 132*  K 2.9*   < > 3.9 3.3*  --   3.1* 3.8 3.7 3.2*  CL 91*   < > 95* 96*  --  96* 97* 96* 95*  CO2 24   < > 21* 23  --  22 26 26 26   GLUCOSE 97   < > 180* 132*  --  153* 128* 121* 121*  BUN 115*   < > 57* 89*  --  108* 48* 66* 89*  CREATININE 2.87*   < > 1.71* 2.41*  --  2.44* 1.50* 1.94* 2.00*  CALCIUM 9.2   < > 8.8* 8.9  --  8.9 8.7* 9.1 8.9  MG 2.7*  --  2.1  --  2.4  --   --  2.1 2.2  PHOS 5.7*   < > 3.4 4.8*  --  5.2* 2.6 3.8 4.2   < > = values in this interval not displayed.     Liver Function Tests: Recent Labs  Lab 02/05/21 1550 02/06/21 0427 02/06/21 1720 02/07/21 0430 02/08/21 0410  ALBUMIN 2.2* 2.2* 2.7* 2.6* 2.4*    No results for input(s): LIPASE, AMYLASE in the last 168 hours. No results for input(s): AMMONIA in the last 168 hours.  CBC: Recent Labs  Lab 02/04/21 0313 02/05/21 0010 02/06/21 0426 02/07/21 0430 02/08/21 0410  WBC 7.3 6.9 6.6 7.2 5.9  HGB 6.7* 8.7* 7.7* 7.5* 7.3*  HCT 21.4* 27.3* 25.4* 24.2* 23.2*  MCV 84.6 83.7 86.1 84.6 84.7  PLT 262 323 356 323 348     Cardiac Enzymes: No results for input(s): CKTOTAL, CKMB, CKMBINDEX, TROPONINI in  the last 168 hours.  BNP: Invalid input(s): POCBNP  CBG: Recent Labs  Lab 02/07/21 1124 02/07/21 1515 02/07/21 2024 02/07/21 2320 02/08/21 0309  GLUCAP 169* 120* 142* 194* 155*     Microbiology: Results for orders placed or performed during the hospital encounter of 12/28/20  Resp Panel by RT-PCR (Flu A&B, Covid) Nasopharyngeal Swab     Status: None   Collection Time: 12/28/20  4:17 PM   Specimen: Nasopharyngeal Swab; Nasopharyngeal(NP) swabs in vial transport medium  Result Value Ref Range Status   SARS Coronavirus 2 by RT PCR NEGATIVE NEGATIVE Final    Comment: (NOTE) SARS-CoV-2 target nucleic acids are NOT DETECTED.  The SARS-CoV-2 RNA is generally detectable in upper respiratory specimens during the acute phase of infection. The lowest concentration of SARS-CoV-2 viral copies this assay can detect is 138  copies/mL. A negative result does not preclude SARS-Cov-2 infection and should not be used as the sole basis for treatment or other patient management decisions. A negative result may occur with  improper specimen collection/handling, submission of specimen other than nasopharyngeal swab, presence of viral mutation(s) within the areas targeted by this assay, and inadequate number of viral copies(<138 copies/mL). A negative result must be combined with clinical observations, patient history, and epidemiological information. The expected result is Negative.  Fact Sheet for Patients:  EntrepreneurPulse.com.au  Fact Sheet for Healthcare Providers:  IncredibleEmployment.be  This test is no t yet approved or cleared by the [place] FDA and  has been authorized for detection and/or diagnosis of SARS-CoV-2 by FDA under an Emergency Use Authorization (EUA). This EUA will remain  in effect (meaning this test can be used) for the duration of the COVID-19 declaration under Section 564(b)(1) of the Act, 21 U.S.C.section 360bbb-3(b)(1), unless the authorization is terminated  or revoked sooner.       Influenza A by PCR NEGATIVE NEGATIVE Final   Influenza B by PCR NEGATIVE NEGATIVE Final    Comment: (NOTE) The Xpert Xpress SARS-CoV-2/FLU/RSV plus assay is intended as an aid in the diagnosis of influenza from Nasopharyngeal swab specimens and should not be used as a sole basis for treatment. Nasal washings and aspirates are unacceptable for Xpert Xpress SARS-CoV-2/FLU/RSV testing.  Fact Sheet for Patients: EntrepreneurPulse.com.au  Fact Sheet for Healthcare Providers: IncredibleEmployment.be  This test is not yet approved or cleared by the [place] FDA and has been authorized for detection and/or diagnosis of SARS-CoV-2 by FDA under an Emergency Use Authorization (EUA). This EUA will remain in effect (meaning  this test can be used) for the duration of the COVID-19 declaration under Section 564(b)(1) of the Act, 21 U.S.C. section 360bbb-3(b)(1), unless the authorization is terminated or revoked.  Performed at [facility], [place] [address] [place], [place] [address]   MRSA Next Gen by PCR, Nasal     Status: None   Collection Time: 12/28/20  7:32 PM   Specimen: Nasal Mucosa; Nasal Swab  Result Value Ref Range Status   MRSA by PCR Next Gen NOT DETECTED NOT DETECTED Final    Comment: (NOTE) The GeneXpert MRSA Assay (FDA approved for NASAL specimens only), is one component of a comprehensive MRSA colonization surveillance program. It is not intended to diagnose MRSA infection nor to guide or monitor treatment for MRSA infections. Test performance is not FDA approved in patients less than 52 years old. Performed at [facility], [place] [address], [place], [place] [address]   Culture, Respiratory w Gram Stain  Status: None   Collection Time: 12/31/20 11:06 AM   Specimen: Tracheal Aspirate; Respiratory  Result Value Ref Range Status   Specimen Description TRACHEAL ASPIRATE  Final   Special Requests NONE  Final   Gram Stain   Final    FEW SQUAMOUS EPITHELIAL CELLS PRESENT FEW WBC PRESENT,BOTH PMN AND MONONUCLEAR FEW GRAM POSITIVE COCCI Performed at [facility], [place] [address] [place], [place] [address]    Culture   Final    FEW PSEUDOMONAS AERUGINOSA FEW STREPTOCOCCUS PNEUMONIAE    Report Status 01/03/2021 FINAL  Final   Organism ID, Bacteria PSEUDOMONAS AERUGINOSA  Final   Organism ID, Bacteria STREPTOCOCCUS PNEUMONIAE  Final      Susceptibility   Pseudomonas aeruginosa - MIC*    CEFTAZIDIME 4 SENSITIVE Sensitive     CIPROFLOXACIN <=0.25 SENSITIVE Sensitive     GENTAMICIN <=1 SENSITIVE Sensitive     IMIPENEM 2 SENSITIVE Sensitive     PIP/TAZO 8 SENSITIVE Sensitive     CEFEPIME 2 SENSITIVE Sensitive     * FEW PSEUDOMONAS AERUGINOSA   Streptococcus pneumoniae -  MIC*    ERYTHROMYCIN 4 RESISTANT Resistant     LEVOFLOXACIN 0.5 SENSITIVE Sensitive     VANCOMYCIN <=0.12 SENSITIVE Sensitive     PENO - penicillin <=0.06      PENICILLIN (non-meningitis) <=0.06 SENSITIVE Sensitive     PENICILLIN (oral) <=0.06 SENSITIVE Sensitive     CEFTRIAXONE (non-meningitis) <=0.12 SENSITIVE Sensitive     * FEW STREPTOCOCCUS PNEUMONIAE  Culture, blood (routine x 2)     Status: None   Collection Time: 01/05/21 11:44 AM   Specimen: BLOOD  Result Value Ref Range Status   Specimen Description BLOOD SITE NOT SPECIFIED  Final   Special Requests AEROBIC BOTTLE ONLY Blood Culture adequate volume  Final   Culture   Final    NO GROWTH 5 DAYS Performed at [facility], [address], [place], [place] [address]    Report Status 01/10/2021 FINAL  Final  Culture, blood (routine x 2)     Status: None   Collection Time: 01/05/21 11:44 AM   Specimen: BLOOD  Result Value Ref Range Status   Specimen Description BLOOD SITE NOT SPECIFIED  Final   Special Requests   Final    AEROBIC BOTTLE ONLY Blood Culture results may not be optimal due to an inadequate volume of blood received in culture bottles   Culture   Final    NO GROWTH 5 DAYS Performed at [facility], [place] [address], [place], [place] [address]    Report Status 01/10/2021 FINAL  Final  Surgical PCR screen     Status: None   Collection Time: 01/08/21 12:14 AM   Specimen: Nasal Mucosa; Nasal Swab  Result Value Ref Range Status   MRSA, PCR NEGATIVE NEGATIVE Final   Staphylococcus aureus NEGATIVE NEGATIVE Final    Comment: (NOTE) The Xpert SA Assay (FDA approved for NASAL specimens in patients 44 years of age and older), is one component of a comprehensive surveillance program. It is not intended to diagnose infection nor to guide or monitor treatment. Performed at [facility], [place] [address], [place], [place] [address]   Culture, Respiratory w Gram Stain     Status: None   Collection Time:  01/08/21  1:24 PM   Specimen: Tracheal Aspirate; Respiratory  Result Value Ref Range Status   Specimen Description TRACHEAL ASPIRATE  Final   Special Requests NONE  Final  Gram Stain   Final    RARE SQUAMOUS EPITHELIAL CELLS PRESENT MODERATE WBC PRESENT, PREDOMINANTLY MONONUCLEAR FEW GRAM NEGATIVE RODS Performed at [facility], [place] [address], [place], [place] [address]    Culture   Final    RARE PSEUDOMONAS AERUGINOSA RARE ENTEROCOCCUS FAECALIS    Report Status 01/11/2021 FINAL  Final   Organism ID, Bacteria PSEUDOMONAS AERUGINOSA  Final   Organism ID, Bacteria ENTEROCOCCUS FAECALIS  Final      Susceptibility   Enterococcus faecalis - MIC*    AMPICILLIN <=2 SENSITIVE Sensitive     VANCOMYCIN 1 SENSITIVE Sensitive     GENTAMICIN SYNERGY SENSITIVE Sensitive     * RARE ENTEROCOCCUS FAECALIS   Pseudomonas aeruginosa - MIC*    CEFTAZIDIME 4 SENSITIVE Sensitive     CIPROFLOXACIN <=0.25 SENSITIVE Sensitive     GENTAMICIN <=1 SENSITIVE Sensitive     IMIPENEM 2 SENSITIVE Sensitive     PIP/TAZO 8 SENSITIVE Sensitive     CEFEPIME 2 SENSITIVE Sensitive     * RARE PSEUDOMONAS AERUGINOSA  Gastrointestinal Panel by PCR , Stool     Status: None   Collection Time: 01/08/21  5:04 PM   Specimen: Stool  Result Value Ref Range Status   Campylobacter species NOT DETECTED NOT DETECTED Final   Plesimonas shigelloides NOT DETECTED NOT DETECTED Final   Salmonella species NOT DETECTED NOT DETECTED Final   Yersinia enterocolitica NOT DETECTED NOT DETECTED Final   Vibrio species NOT DETECTED NOT DETECTED Final   Vibrio cholerae NOT DETECTED NOT DETECTED Final   Enteroaggregative E coli (EAEC) NOT DETECTED NOT DETECTED Final   Enteropathogenic E coli (EPEC) NOT DETECTED NOT DETECTED Final   Enterotoxigenic E coli (ETEC) NOT DETECTED NOT DETECTED Final   Shiga like toxin producing E coli (STEC) NOT DETECTED NOT DETECTED Final   Shigella/Enteroinvasive E coli (EIEC) NOT DETECTED NOT DETECTED  Final   Cryptosporidium NOT DETECTED NOT DETECTED Final   Cyclospora cayetanensis NOT DETECTED NOT DETECTED Final   Entamoeba histolytica NOT DETECTED NOT DETECTED Final   Giardia lamblia NOT DETECTED NOT DETECTED Final   Adenovirus F40/41 NOT DETECTED NOT DETECTED Final   Astrovirus NOT DETECTED NOT DETECTED Final   Norovirus GI/GII NOT DETECTED NOT DETECTED Final   Rotavirus A NOT DETECTED NOT DETECTED Final   Sapovirus (I, II, IV, and V) NOT DETECTED NOT DETECTED Final    Comment: Performed at [facility], [place] [place], [place] [address]  C Difficile Quick Screen (NO PCR Reflex)     Status: None   Collection Time: 01/09/21 11:07 AM   Specimen: STOOL  Result Value Ref Range Status   C Diff antigen NEGATIVE NEGATIVE Final   C Diff toxin NEGATIVE NEGATIVE Final   C Diff interpretation No C. difficile detected.  Final    Comment: Performed at [facility], [place] [address], [place], [place] [address]  Culture, Respiratory w Gram Stain     Status: None   Collection Time: 01/24/21  8:57 AM   Specimen: Tracheal Aspirate; Respiratory  Result Value Ref Range Status   Specimen Description TRACHEAL ASPIRATE  Final   Special Requests NONE  Final   Gram Stain   Final    ABUNDANT WBC PRESENT, PREDOMINANTLY MONONUCLEAR RARE FEW GRAM NEGATIVE RODS    Culture   Final    ABUNDANT PSEUDOMONAS AERUGINOSA Two isolates with different morphologies were identified as the same organism.The most resistant organism was reported. Performed at [facility], [place] [address] [place], [place] [address]    Report Status 01/27/2021 FINAL  Final   Organism ID, Bacteria PSEUDOMONAS AERUGINOSA  Final      Susceptibility   Pseudomonas aeruginosa - MIC*    CEFTAZIDIME 16 INTERMEDIATE Intermediate     CIPROFLOXACIN 1 SENSITIVE Sensitive     GENTAMICIN <=1 SENSITIVE Sensitive     IMIPENEM 2 SENSITIVE Sensitive     * ABUNDANT PSEUDOMONAS AERUGINOSA    Coagulation Studies: No  results for input(s): LABPROT, INR in the last 72 hours.  Urinalysis: No results for input(s): COLORURINE, LABSPEC, PHURINE, GLUCOSEU, HGBUR, BILIRUBINUR, KETONESUR, PROTEINUR, UROBILINOGEN, NITRITE, LEUKOCYTESUR in the last 72 hours.  Invalid input(s): APPERANCEUR    Imaging: No results found.   Medications:    sodium chloride     albumin human 25 g (02/06/21 1045)   ceFAZolin     dextrose 5% lactated ringers Stopped (02/04/21 1907)    sodium chloride   Intravenous Once   acetaminophen  1,000 mg Per Tube Q6H   amiodarone  200 mg Per Tube BID   apixaban  5 mg Per Tube BID   chlorhexidine  15 mL Mouth Rinse BID   Chlorhexidine Gluconate Cloth  6 each Topical Q0600   Chlorhexidine Gluconate Cloth  6 each Topical Q0600   docusate  100 mg Per Tube BID   feeding supplement (NEPRO CARB STEADY)  1,000 mL Per Tube Q24H   feeding supplement (NEPRO CARB STEADY)  237 mL Oral BID BM   feeding supplement (PROSource TF)  45 mL Per Tube BID   guaiFENesin  10 mL Per Tube Q4H   insulin aspart  0-20 Units Subcutaneous Q4H   insulin glargine-yfgn  60 Units Subcutaneous BID   mouth rinse  15 mL Mouth Rinse q12n4p   methocarbamol  1,000 mg Per Tube Q8H   metoprolol tartrate  25 mg Per Tube TID   pantoprazole sodium  40 mg Per Tube Daily   polyethylene glycol  17 g Per Tube Daily   QUEtiapine  25 mg Per Tube QHS   senna  1 tablet Per Tube Daily   sodium chloride flush  10-40 mL Intracatheter Q12H   Place/Maintain arterial line **AND** sodium chloride, albumin human, artificial tears, ceFAZolin, fentaNYL, heparin, hydrALAZINE, HYDROmorphone (DILAUDID) injection, midazolam, midazolam, ondansetron **OR** ondansetron (ZOFRAN) IV, oxyCODONE, sodium chloride flush  Assessment/ Plan:   #Acute kidney injury, oliguric: Multifactorial etiology including ischemic ATN in the setting of hypotension, sepsis complicated by contrast injury. CRRT from 9/1-9/9.  No heparin as he is on bivalirudin.  The HD  catheter was changed on 9/7.  CRRT restarted on 9/11 given elevated BUN and with more confusion, did not tolerated IHD on 9/10 (confusion, hypotension). CRRT clotted off on 9/13.   -HD 02/06/2021 his next dialysis will be 02/08/2021      #Fall/bilateral subarachnoid hemorrhage/SDH, TBI/occipital and temporal bone fracture: Per trauma team.  Repeat CT scan with no acute finding.   #Acute respiratory failure: Status post trach on 8/29, trach collar   #A. fib with RVR: On amiodarone, Angiomax.   # Anemia of critical illness: Transfuse as needed.   #Metabolic acidosis: Managed with dialysis. resolved   #Bilateral pulm embolism: Currently on anticoagulation.   #Acute febrile illness: Per primary team.   #Hyperkalemia:  K wnl now, managing with HD    LOS: [place] @TODAY @8 :01 AM

## 2021-02-08 NOTE — Progress Notes (Signed)
Per Dr. Newman Nickels request, cardiology f/u arranged 10/19 and placed on AVS.

## 2021-02-08 NOTE — Progress Notes (Signed)
Hypoglycemic Event  CBG: 58  Treatment: 8 oz juice/soda  Symptoms: None  Follow-up CBG: Time: 0900 CBG Result: 88  Possible Reasons for Event: Inadequate meal intake       Marcha Solders

## 2021-02-08 NOTE — Progress Notes (Signed)
PT Cancellation Note  Patient Details Name: Angel [name] MRN: [id number] DOB: February 03, 1951   Cancelled Treatment:    Reason Eval/Treat Not Completed: Patient at procedure or test/unavailable; patient out of the room for HD.  Will attempt again another day.   Reginia Naas 02/08/2021, 2:02 PM Magda Kiel, PT Acute Rehabilitation Services Pager:[phone] [phone] 02/08/2021

## 2021-02-08 NOTE — Progress Notes (Signed)
Progress Note  Patient Name: Angel [name] Date of Encounter: 02/08/2021  Primary Cardiologist:   Werner Lean, MD   Subjective   Converted back to NSR.  Denies any chest pain or dyspnea.  Inpatient Medications    Scheduled Meds:  sodium chloride   Intravenous Once   acetaminophen  1,000 mg Per Tube Q6H   amiodarone  200 mg Per Tube BID   apixaban  5 mg Per Tube BID   chlorhexidine  15 mL Mouth Rinse BID   Chlorhexidine Gluconate Cloth  6 each Topical Q0600   Chlorhexidine Gluconate Cloth  6 each Topical Q0600   docusate  100 mg Per Tube BID   feeding supplement (NEPRO CARB STEADY)  1,000 mL Per Tube Q24H   feeding supplement (NEPRO CARB STEADY)  237 mL Oral BID BM   feeding supplement (PROSource TF)  45 mL Per Tube BID   guaiFENesin  10 mL Per Tube Q4H   insulin aspart  0-20 Units Subcutaneous Q4H   insulin glargine-yfgn  60 Units Subcutaneous BID   mouth rinse  15 mL Mouth Rinse q12n4p   methocarbamol  1,000 mg Per Tube Q8H   metoprolol tartrate  25 mg Per Tube TID   pantoprazole sodium  40 mg Per Tube Daily   polyethylene glycol  17 g Per Tube Daily   QUEtiapine  25 mg Per Tube QHS   senna  1 tablet Per Tube Daily   sodium chloride flush  10-40 mL Intracatheter Q12H   Continuous Infusions:  sodium chloride     albumin human 25 g (02/06/21 1045)   ceFAZolin     dextrose 5% lactated ringers Stopped (02/04/21 1907)   potassium chloride 10 mEq (02/08/21 1103)   PRN Meds: Place/Maintain arterial line **AND** sodium chloride, albumin human, artificial tears, ceFAZolin, fentaNYL, heparin, hydrALAZINE, HYDROmorphone (DILAUDID) injection, midazolam, midazolam, ondansetron **OR** ondansetron (ZOFRAN) IV, oxyCODONE, sodium chloride flush   Vital Signs    Vitals:   02/08/21 0310 02/08/21 0416 02/08/21 0555 02/08/21 0825  BP: 120/65   113/65  Pulse: 72   65  Resp: 18   18  Temp: 98.4 F (36.9 C)   98.7 F (37.1 C)  TempSrc: Oral   Axillary  SpO2: 95% 95%   95%  Weight:   109 kg   Height:        Intake/Output Summary (Last 24 hours) at 02/08/2021 1116 Last data filed at 02/08/2021 0845 Gross per 24 hour  Intake 1217 ml  Output 300 ml  Net 917 ml    Filed Weights   02/06/21 1423 02/07/21 0419 02/08/21 0555  Weight: 108.1 kg 106.4 kg 109 kg    Telemetry   NSR- Personally Reviewed  ECG    Normal sinus rhythm with PACs, rate 85, QTc 476 personally Reviewed  Physical Exam   GEN: ill appearing, trached HEENT: Normal NECK: difficult to assess JVD due to body habitus.  Trach present CARDIAC: Regular rate and rhythm, no murmurs, rubs, gallops RESPIRATORY:  Clear to auscultation without rales, wheezing or rhonchi  ABDOMEN: Soft, non-tender, non-distended MUSCULOSKELETAL:  No edema SKIN: Warm and dry NEUROLOGIC:  alert, answers questions PSYCHIATRIC: cannot assess  Labs    Chemistry Recent Labs  Lab 02/06/21 1720 02/07/21 0430 02/08/21 0410  NA 134* 135 132*  K 3.8 3.7 3.2*  CL 97* 96* 95*  CO2 26 26 26   GLUCOSE 128* 121* 121*  BUN 48* 66* 89*  CREATININE 1.50* 1.94* 2.00*  CALCIUM  8.7* 9.1 8.9  ALBUMIN 2.7* 2.6* 2.4*  GFRNONAA 50* 37* 35*  ANIONGAP 11 13 11       Hematology Recent Labs  Lab 02/06/21 0426 02/07/21 0430 02/08/21 0410  WBC 6.6 7.2 5.9  RBC 2.95* 2.86* 2.74*  HGB 7.7* 7.5* 7.3*  HCT 25.4* 24.2* 23.2*  MCV 86.1 84.6 84.7  MCH 26.1 26.2 26.6  MCHC 30.3 31.0 31.5  RDW 17.7* 17.6* 17.3*  PLT 356 323 348     Cardiac EnzymesNo results for input(s): TROPONINI in the last 168 hours. No results for input(s): TROPIPOC in the last 168 hours.   BNPNo results for input(s): BNP, PROBNP in the last 168 hours.   DDimer No results for input(s): DDIMER in the last 168 hours.   Radiology    No results found.  Cardiac Studies   Echo 01/07/21: 1. Left ventricular ejection fraction, by estimation, is 60 to 65%. The  left ventricle has normal function. The left ventricle has no regional  wall motion  abnormalities. There is mild left ventricular hypertrophy.  Left ventricular diastolic parameters  are indeterminate.   2. Right ventricule is poorly visualized but grossly normal size and  systolic function   3. Left atrial size was mildly dilated.   4. Right atrial size was mildly dilated.   5. The mitral valve is normal in structure. No evidence of mitral valve  regurgitation. No evidence of mitral stenosis.   6. The aortic valve was not well visualized. Aortic valve regurgitation  is not visualized. No aortic stenosis is present.   Patient Profile     70 y.o. male with a PMH of hyperlipidemia, DM type II, who presented with fall resulting in subarachnoid hemorrhage requiring intubation with hospital course complicated by Multi lobar pneumonia, PE, sepsis, AKI requiring CRRT, and new onset atrial fibrillation/flutter for which cardiology is following.  Assessment & Plan    New onset paroxysmal atrial fibrillation  -Started on Eliquis, also with bilateral pulmonary embolism.  Hgb trending down slowly, was 6.7 on 9/19 and given 1 unit PRBCs.  Will need to monitor closely on anticoagulation -Converted to normal sinus rhythm 9/20 but back in AF 9/22.  Now back in sinus rhythm today.  Continue metoprolol 25 mg BID.  Continue amiodarone 200 mg BID x1 week, then decrease to 200 mg daily.  Hypoxic respiratory failure -Hospital course complicated by multilobar PNA and bilateral PE -Continue vent management per primary team.   AKI -Was on CRRT, now HD due to issues with CRRT  HTN -BP normal  -continue metoprolol   QT prolongation -QTC 541 on EKG 9/21.  We will need to monitor as on amiodarone.  Suspect hypokalemia contributing, K 3.1.  K repleted. QTC improved to 419 on EKG today   [facility] will sign off.   Medication Recommendations:  metoprolol 25 mg BID, amiodarone 200 mg BID x 7 days then 200 mg daily, Eliquis 5 mg BID Other recommendations (labs, testing, etc):   None Follow up as an outpatient:  Will schedule   For questions or updates, please contact [place] Please consult [email or web address] for contact info under Cardiology/STEMI.   Donato Heinz, MD  02/08/2021, 11:16 AM

## 2021-02-09 DIAGNOSIS — Z20822 Contact with and (suspected) exposure to covid-19: Secondary | ICD-10-CM | POA: Diagnosis not present

## 2021-02-09 DIAGNOSIS — I2699 Other pulmonary embolism without acute cor pulmonale: Secondary | ICD-10-CM | POA: Diagnosis not present

## 2021-02-09 DIAGNOSIS — J9601 Acute respiratory failure with hypoxia: Secondary | ICD-10-CM | POA: Diagnosis not present

## 2021-02-09 DIAGNOSIS — S066X9A Traumatic subarachnoid hemorrhage with loss of consciousness of unspecified duration, initial encounter: Secondary | ICD-10-CM | POA: Diagnosis not present

## 2021-02-09 LAB — BASIC METABOLIC PANEL
Anion gap: 11 (ref 5–15)
BUN: 56 mg/dL — ABNORMAL HIGH (ref 8–23)
CO2: 23 mmol/L (ref 22–32)
Calcium: 8.8 mg/dL — ABNORMAL LOW (ref 8.9–10.3)
Chloride: 96 mmol/L — ABNORMAL LOW (ref 98–111)
Creatinine, Ser: 1.73 mg/dL — ABNORMAL HIGH (ref 0.61–1.24)
GFR, Estimated: 42 mL/min — ABNORMAL LOW (ref >60)
Glucose, Bld: 160 mg/dL — ABNORMAL HIGH (ref 70–99)
Potassium: 4 mmol/L (ref 3.5–5.1)
Sodium: 130 mmol/L — ABNORMAL LOW (ref 135–145)

## 2021-02-09 LAB — CBC
HCT: 26.6 % — ABNORMAL LOW (ref 39.0–52.0)
Hemoglobin: 8.2 g/dL — ABNORMAL LOW (ref 13.0–17.0)
MCH: 26.4 pg (ref 26.0–34.0)
MCHC: 30.8 g/dL (ref 30.0–36.0)
MCV: 85.5 fL (ref 80.0–100.0)
Platelets: 386 10*3/uL (ref 150–400)
RBC: 3.11 MIL/uL — ABNORMAL LOW (ref 4.22–5.81)
RDW: 17.5 % — ABNORMAL HIGH (ref 11.5–15.5)
WBC: 7.9 10*3/uL (ref 4.0–10.5)
nRBC: 0 % (ref 0.0–0.2)

## 2021-02-09 LAB — GLUCOSE, CAPILLARY
Glucose-Capillary: 123 mg/dL — ABNORMAL HIGH (ref 70–99)
Glucose-Capillary: 129 mg/dL — ABNORMAL HIGH (ref 70–99)
Glucose-Capillary: 145 mg/dL — ABNORMAL HIGH (ref 70–99)
Glucose-Capillary: 155 mg/dL — ABNORMAL HIGH (ref 70–99)
Glucose-Capillary: 187 mg/dL — ABNORMAL HIGH (ref 70–99)
Glucose-Capillary: 81 mg/dL (ref 70–99)

## 2021-02-09 LAB — MAGNESIUM: Magnesium: 2.1 mg/dL (ref 1.7–2.4)

## 2021-02-09 NOTE — Progress Notes (Signed)
Trauma/Critical Care Follow Up Note  Subjective:    Overnight Issues:   Objective:  Vital signs for last 24 hours: Temp:  [98.2 F (36.8 C)-99.1 F (37.3 C)] 98.2 F (36.8 C) (09/24 1136) Pulse Rate:  [66-98] 73 (09/24 1136) Resp:  [14-19] 19 (09/24 1136) BP: (92-121)/(56-84) 109/81 (09/24 1136) SpO2:  [94 %-100 %] 96 % (09/24 1136) FiO2 (%):  [21 %] 21 % (09/24 1055) Weight:  [106.5 kg-107.9 kg] 107 kg (09/24 0538)  Hemodynamic parameters for last 24 hours:    Intake/Output from previous day: 09/23 0701 - 09/24 0700 In: 757 [P.O.:657; IV Piggyback:100] Out: 2500   Intake/Output this shift: Total I/O In: 240 [P.O.:240] Out: -   Vent settings for last 24 hours: FiO2 (%):  [21 %] 21 %  Physical Exam:  Gen: comfortable, no distress Neuro: non-focal exam HEENT: PERRL Neck: supple CV: RRR Pulm: unlabored breathing Abd: soft, NT GU: clear yellow urine Extr: wwp, no edema   Results for orders placed or performed during the hospital encounter of 12/28/20 (from the past 24 hour(s))  Glucose, capillary     Status: Abnormal   Collection Time: 02/08/21  6:08 PM  Result Value Ref Range   Glucose-Capillary 104 (H) 70 - 99 mg/dL  Glucose, capillary     Status: None   Collection Time: 02/08/21  7:35 PM  Result Value Ref Range   Glucose-Capillary 90 70 - 99 mg/dL  Glucose, capillary     Status: Abnormal   Collection Time: 02/09/21 12:18 AM  Result Value Ref Range   Glucose-Capillary 145 (H) 70 - 99 mg/dL  Magnesium     Status: None   Collection Time: 02/09/21  1:55 AM  Result Value Ref Range   Magnesium 2.1 1.7 - 2.4 mg/dL  CBC     Status: Abnormal   Collection Time: 02/09/21  1:55 AM  Result Value Ref Range   WBC 7.9 4.0 - 10.5 K/uL   RBC 3.11 (L) 4.22 - 5.81 MIL/uL   Hemoglobin 8.2 (L) 13.0 - 17.0 g/dL   HCT 26.6 (L) 39.0 - 52.0 %   MCV 85.5 80.0 - 100.0 fL   MCH 26.4 26.0 - 34.0 pg   MCHC 30.8 30.0 - 36.0 g/dL   RDW 17.5 (H) 11.5 - 15.5 %   Platelets  386 150 - 400 K/uL   nRBC 0.0 0.0 - 0.2 %  Basic metabolic panel     Status: Abnormal   Collection Time: 02/09/21  1:55 AM  Result Value Ref Range   Sodium 130 (L) 135 - 145 mmol/L   Potassium 4.0 3.5 - 5.1 mmol/L   Chloride 96 (L) 98 - 111 mmol/L   CO2 23 22 - 32 mmol/L   Glucose, Bld 160 (H) 70 - 99 mg/dL   BUN 56 (H) 8 - 23 mg/dL   Creatinine, Ser 1.73 (H) 0.61 - 1.24 mg/dL   Calcium 8.8 (L) 8.9 - 10.3 mg/dL   GFR, Estimated 42 (L) >60 mL/min   Anion gap 11 5 - 15  Glucose, capillary     Status: Abnormal   Collection Time: 02/09/21  4:46 AM  Result Value Ref Range   Glucose-Capillary 155 (H) 70 - 99 mg/dL  Glucose, capillary     Status: None   Collection Time: 02/09/21  7:36 AM  Result Value Ref Range   Glucose-Capillary 81 70 - 99 mg/dL  Glucose, capillary     Status: Abnormal   Collection Time: 02/09/21 11:35 AM  Result Value Ref Range   Glucose-Capillary 129 (H) 70 - 99 mg/dL    Assessment & Plan:  Present on Admission: **None**    LOS: 43 days   Additional comments:I reviewed the patient's new clinical lab test results.   and I reviewed the patients new imaging test results.    Fall down stairs 8/12   VDRF - guaifenisen, S/P trach 8/29 by Dr. Bobbye Morton. Has tolerated HTC well, passy muir trials.  Trach changed to cuffless #6 on 9/20. Hope to downsize to #4 cuffless soon ID - off abx, no fevers TBI/SAH/SDH - NSGY c/s, Dr. Annette Stable. Significant frontal lobe injuries. Keppra x7d for sz ppx (completed) Occipital bone fx - NSGY c/s, Dr. Annette Stable Temporal bone fx extending into middle ear - ENT c/s, Dr. Constance Holster, no acute treatment, will need re-eval hearing and facial nerve  Right TM Rupture - ENT c/s, Dr. Phoebe Sharps -  per Cardiology, IV amio transitioned to 200 mg amio per tube 9/21. lopressor added per Cardiology 9/17, increased to 25 mg TID 9/22. Qtc 419 from 476 9/23, improving, monitor ABL anemia- 1u PRBC 9/19, Hb 7.3 from 7.5, monitor Bilateral pulmonary embolism -  Eliquis AKI - HD per Renal, IR placed tunneled HD cath 9/19. HD today 9/23 Hx DM2 - resistant SSI, novolog q4, glargine 60u BID Hx HTN - PRN meds FEN - tolerating TF,  FEES 9/21 >> now on DYS2 diet, replete K IV, nocturnal TF and calorie count VTE - SCDs, Eliquis Dispo - HTC, therapies, DYS 2 diet w/ nocturnal tube feeds, monitor QTC, insurance approved SNF/LTACH, awaiting HD bed  Angel Oka, MD Trauma & General Surgery Please use [email or web address] to contact on call provider  02/09/2021  *Care during the described time interval was provided by me. I have reviewed this patient's available data, including medical history, events of note, physical examination and test results as part of my evaluation.

## 2021-02-09 NOTE — Progress Notes (Signed)
[facility] ROUNDING NOTE   Subjective:   Interval History: This is a 70 year old gentleman who presented 12/28/2020 with a traumatic brain injury subarachnoid hemorrhage subdural hemorrhage temporal bone fracture extending into the middle area occipital bone fracture and right tympanic membrane rupture.  Hospital course was complicated by pulmonary embolus 01/09/2021.  He had acute kidney injury and was started on CVVHD 01/18/2021.  He was transitioned to acute intermittent hemodialysis.  He does not appear to have recovery of renal function.  His last dialysis was 02/08/2021 with 2.5 L removed.  Next dialysis treatment will be 02/11/2021  Blood pressure 113/58 pulse 73 temperature 98.7 O2 sats 95% trach collar.  May be started to make a little urine.  Urine output recorded 02/07/2022.  We will continue to follow monitor for recovery  Sodium 130 potassium 4 chloride 96 CO2 23 BUN 56 creatinine 1.7 glucose 160 calcium 8.8 magnesium 2.1 hemoglobin 8.2  Objective:  Vital signs in last 24 hours:  Temp:  [98.6 F (37 C)-99.1 F (37.3 C)] 98.7 F (37.1 C) (09/24 0736) Pulse Rate:  [66-98] 78 (09/24 0748) Resp:  [16-19] 16 (09/24 0748) BP: (92-121)/(64-84) 121/71 (09/24 0736) SpO2:  [95 %-100 %] 96 % (09/24 0748) FiO2 (%):  [21 %-28 %] 28 % (09/24 0748) Weight:  [106.5 kg-107.9 kg] 107 kg (09/24 0538)  Weight change: -1.1 kg Filed Weights   02/08/21 1339 02/08/21 1715 02/09/21 0538  Weight: 107.9 kg 106.5 kg 107 kg    Intake/Output: I/O last 3 completed shifts: In: 1494 [P.O.:657; NG/GT:737; IV Piggyback:100] Out: 2500 [Other:2500]   Intake/Output this shift:  No intake/output data recorded.  General: nad, tracheostomy Heart: rrr Lungs: trach, cta bl Abdomen:soft, nontender. Extremities: trace dependent edema. Neurology:Alert awake  Dialysis Access: Left subclavian temporary HD catheter placed on 9/7   Basic Metabolic Panel: Recent Labs  Lab 02/05/21 0010  02/05/21 1550 02/06/21 0426 02/06/21 0427 02/06/21 1720 02/07/21 0430 02/08/21 0410 02/09/21 0155  NA 134* 134*  --  132* 134* 135 132* 130*  K 3.9 3.3*  --  3.1* 3.8 3.7 3.2* 4.0  CL 95* 96*  --  96* 97* 96* 95* 96*  CO2 21* 23  --  22 26 26 26 23   GLUCOSE 180* 132*  --  153* 128* 121* 121* 160*  BUN 57* 89*  --  108* 48* 66* 89* 56*  CREATININE 1.71* 2.41*  --  2.44* 1.50* 1.94* 2.00* 1.73*  CALCIUM 8.8* 8.9  --  8.9 8.7* 9.1 8.9 8.8*  MG 2.1  --  2.4  --   --  2.1 2.2 2.1  PHOS 3.4 4.8*  --  5.2* 2.6 3.8 4.2  --      Liver Function Tests: Recent Labs  Lab 02/05/21 1550 02/06/21 0427 02/06/21 1720 02/07/21 0430 02/08/21 0410  ALBUMIN 2.2* 2.2* 2.7* 2.6* 2.4*    No results for input(s): LIPASE, AMYLASE in the last 168 hours. No results for input(s): AMMONIA in the last 168 hours.  CBC: Recent Labs  Lab 02/05/21 0010 02/06/21 0426 02/07/21 0430 02/08/21 0410 02/09/21 0155  WBC 6.9 6.6 7.2 5.9 7.9  HGB 8.7* 7.7* 7.5* 7.3* 8.2*  HCT 27.3* 25.4* 24.2* 23.2* 26.6*  MCV 83.7 86.1 84.6 84.7 85.5  PLT 323 356 323 348 386     Cardiac Enzymes: No results for input(s): CKTOTAL, CKMB, CKMBINDEX, TROPONINI in the last 168 hours.  BNP: Invalid input(s): POCBNP  CBG: Recent Labs  Lab 02/08/21 1808 02/08/21 1935 02/09/21  0018 02/09/21 0446 02/09/21 0736  GLUCAP 104* 90 145* 155* 81     Microbiology: Results for orders placed or performed during the hospital encounter of 12/28/20  Resp Panel by RT-PCR (Flu A&B, Covid) Nasopharyngeal Swab     Status: None   Collection Time: 12/28/20  4:17 PM   Specimen: Nasopharyngeal Swab; Nasopharyngeal(NP) swabs in vial transport medium  Result Value Ref Range Status   SARS Coronavirus 2 by RT PCR NEGATIVE NEGATIVE Final    Comment: (NOTE) SARS-CoV-2 target nucleic acids are NOT DETECTED.  The SARS-CoV-2 RNA is generally detectable in upper respiratory specimens during the acute phase of infection. The  lowest concentration of SARS-CoV-2 viral copies this assay can detect is 138 copies/mL. A negative result does not preclude SARS-Cov-2 infection and should not be used as the sole basis for treatment or other patient management decisions. A negative result may occur with  improper specimen collection/handling, submission of specimen other than nasopharyngeal swab, presence of viral mutation(s) within the areas targeted by this assay, and inadequate number of viral copies(<138 copies/mL). A negative result must be combined with clinical observations, patient history, and epidemiological information. The expected result is Negative.  Fact Sheet for Patients:  EntrepreneurPulse.com.au  Fact Sheet for Healthcare Providers:  IncredibleEmployment.be  This test is no t yet approved or cleared by the [place] FDA and  has been authorized for detection and/or diagnosis of SARS-CoV-2 by FDA under an Emergency Use Authorization (EUA). This EUA will remain  in effect (meaning this test can be used) for the duration of the COVID-19 declaration under Section 564(b)(1) of the Act, 21 U.S.C.section 360bbb-3(b)(1), unless the authorization is terminated  or revoked sooner.       Influenza A by PCR NEGATIVE NEGATIVE Final   Influenza B by PCR NEGATIVE NEGATIVE Final    Comment: (NOTE) The Xpert Xpress SARS-CoV-2/FLU/RSV plus assay is intended as an aid in the diagnosis of influenza from Nasopharyngeal swab specimens and should not be used as a sole basis for treatment. Nasal washings and aspirates are unacceptable for Xpert Xpress SARS-CoV-2/FLU/RSV testing.  Fact Sheet for Patients: EntrepreneurPulse.com.au  Fact Sheet for Healthcare Providers: IncredibleEmployment.be  This test is not yet approved or cleared by the [place] FDA and has been authorized for detection and/or diagnosis of SARS-CoV-2 by FDA under  an Emergency Use Authorization (EUA). This EUA will remain in effect (meaning this test can be used) for the duration of the COVID-19 declaration under Section 564(b)(1) of the Act, 21 U.S.C. section 360bbb-3(b)(1), unless the authorization is terminated or revoked.  Performed at [facility], [place] [address], [place], [place] [address]   MRSA Next Gen by PCR, Nasal     Status: None   Collection Time: 12/28/20  7:32 PM   Specimen: Nasal Mucosa; Nasal Swab  Result Value Ref Range Status   MRSA by PCR Next Gen NOT DETECTED NOT DETECTED Final    Comment: (NOTE) The GeneXpert MRSA Assay (FDA approved for NASAL specimens only), is one component of a comprehensive MRSA colonization surveillance program. It is not intended to diagnose MRSA infection nor to guide or monitor treatment for MRSA infections. Test performance is not FDA approved in patients less than 32 years old. Performed at [facility], [place] [address], [place], [place] [address]   Culture, Respiratory w Gram Stain     Status: None   Collection Time: 12/31/20 11:06 AM   Specimen: Tracheal Aspirate; Respiratory  Result Value Ref  Range Status   Specimen Description TRACHEAL ASPIRATE  Final   Special Requests NONE  Final   Gram Stain   Final    FEW SQUAMOUS EPITHELIAL CELLS PRESENT FEW WBC PRESENT,BOTH PMN AND MONONUCLEAR FEW GRAM POSITIVE COCCI Performed at [facility], [place] [address] [place], [place] [address]    Culture   Final    FEW PSEUDOMONAS AERUGINOSA FEW STREPTOCOCCUS PNEUMONIAE    Report Status 01/03/2021 FINAL  Final   Organism ID, Bacteria PSEUDOMONAS AERUGINOSA  Final   Organism ID, Bacteria STREPTOCOCCUS PNEUMONIAE  Final      Susceptibility   Pseudomonas aeruginosa - MIC*    CEFTAZIDIME 4 SENSITIVE Sensitive     CIPROFLOXACIN <=0.25 SENSITIVE Sensitive     GENTAMICIN <=1 SENSITIVE Sensitive     IMIPENEM 2 SENSITIVE Sensitive     PIP/TAZO 8 SENSITIVE Sensitive     CEFEPIME 2  SENSITIVE Sensitive     * FEW PSEUDOMONAS AERUGINOSA   Streptococcus pneumoniae - MIC*    ERYTHROMYCIN 4 RESISTANT Resistant     LEVOFLOXACIN 0.5 SENSITIVE Sensitive     VANCOMYCIN <=0.12 SENSITIVE Sensitive     PENO - penicillin <=0.06      PENICILLIN (non-meningitis) <=0.06 SENSITIVE Sensitive     PENICILLIN (oral) <=0.06 SENSITIVE Sensitive     CEFTRIAXONE (non-meningitis) <=0.12 SENSITIVE Sensitive     * FEW STREPTOCOCCUS PNEUMONIAE  Culture, blood (routine x 2)     Status: None   Collection Time: 01/05/21 11:44 AM   Specimen: BLOOD  Result Value Ref Range Status   Specimen Description BLOOD SITE NOT SPECIFIED  Final   Special Requests AEROBIC BOTTLE ONLY Blood Culture adequate volume  Final   Culture   Final    NO GROWTH 5 DAYS Performed at [facility], [address] [place], [place] [address]    Report Status 01/10/2021 FINAL  Final  Culture, blood (routine x 2)     Status: None   Collection Time: 01/05/21 11:44 AM   Specimen: BLOOD  Result Value Ref Range Status   Specimen Description BLOOD SITE NOT SPECIFIED  Final   Special Requests   Final    AEROBIC BOTTLE ONLY Blood Culture results may not be optimal due to an inadequate volume of blood received in culture bottles   Culture   Final    NO GROWTH 5 DAYS Performed at [facility], [place] [address], [place], [place] [address]    Report Status 01/10/2021 FINAL  Final  Surgical PCR screen     Status: None   Collection Time: 01/08/21 12:14 AM   Specimen: Nasal Mucosa; Nasal Swab  Result Value Ref Range Status   MRSA, PCR NEGATIVE NEGATIVE Final   Staphylococcus aureus NEGATIVE NEGATIVE Final    Comment: (NOTE) The Xpert SA Assay (FDA approved for NASAL specimens in patients 10 years of age and older), is one component of a comprehensive surveillance program. It is not intended to diagnose infection nor to guide or monitor treatment. Performed at [facility], [place] [address], [place],  [place] [address]   Culture, Respiratory w Gram Stain     Status: None   Collection Time: 01/08/21  1:24 PM   Specimen: Tracheal Aspirate; Respiratory  Result Value Ref Range Status   Specimen Description TRACHEAL ASPIRATE  Final   Special Requests NONE  Final   Gram Stain   Final    RARE SQUAMOUS EPITHELIAL CELLS PRESENT MODERATE WBC PRESENT, PREDOMINANTLY MONONUCLEAR  FEW GRAM NEGATIVE RODS Performed at [facility], [place] [address], [place], [place] [address]    Culture   Final    RARE PSEUDOMONAS AERUGINOSA RARE ENTEROCOCCUS FAECALIS    Report Status 01/11/2021 FINAL  Final   Organism ID, Bacteria PSEUDOMONAS AERUGINOSA  Final   Organism ID, Bacteria ENTEROCOCCUS FAECALIS  Final      Susceptibility   Enterococcus faecalis - MIC*    AMPICILLIN <=2 SENSITIVE Sensitive     VANCOMYCIN 1 SENSITIVE Sensitive     GENTAMICIN SYNERGY SENSITIVE Sensitive     * RARE ENTEROCOCCUS FAECALIS   Pseudomonas aeruginosa - MIC*    CEFTAZIDIME 4 SENSITIVE Sensitive     CIPROFLOXACIN <=0.25 SENSITIVE Sensitive     GENTAMICIN <=1 SENSITIVE Sensitive     IMIPENEM 2 SENSITIVE Sensitive     PIP/TAZO 8 SENSITIVE Sensitive     CEFEPIME 2 SENSITIVE Sensitive     * RARE PSEUDOMONAS AERUGINOSA  Gastrointestinal Panel by PCR , Stool     Status: None   Collection Time: 01/08/21  5:04 PM   Specimen: Stool  Result Value Ref Range Status   Campylobacter species NOT DETECTED NOT DETECTED Final   Plesimonas shigelloides NOT DETECTED NOT DETECTED Final   Salmonella species NOT DETECTED NOT DETECTED Final   Yersinia enterocolitica NOT DETECTED NOT DETECTED Final   Vibrio species NOT DETECTED NOT DETECTED Final   Vibrio cholerae NOT DETECTED NOT DETECTED Final   Enteroaggregative E coli (EAEC) NOT DETECTED NOT DETECTED Final   Enteropathogenic E coli (EPEC) NOT DETECTED NOT DETECTED Final   Enterotoxigenic E coli (ETEC) NOT DETECTED NOT DETECTED Final   Shiga like toxin producing E coli (STEC) NOT DETECTED  NOT DETECTED Final   Shigella/Enteroinvasive E coli (EIEC) NOT DETECTED NOT DETECTED Final   Cryptosporidium NOT DETECTED NOT DETECTED Final   Cyclospora cayetanensis NOT DETECTED NOT DETECTED Final   Entamoeba histolytica NOT DETECTED NOT DETECTED Final   Giardia lamblia NOT DETECTED NOT DETECTED Final   Adenovirus F40/41 NOT DETECTED NOT DETECTED Final   Astrovirus NOT DETECTED NOT DETECTED Final   Norovirus GI/GII NOT DETECTED NOT DETECTED Final   Rotavirus A NOT DETECTED NOT DETECTED Final   Sapovirus (I, II, IV, and V) NOT DETECTED NOT DETECTED Final    Comment: Performed at [facility], [place] [place], [place] [address]  C Difficile Quick Screen (NO PCR Reflex)     Status: None   Collection Time: 01/09/21 11:07 AM   Specimen: STOOL  Result Value Ref Range Status   C Diff antigen NEGATIVE NEGATIVE Final   C Diff toxin NEGATIVE NEGATIVE Final   C Diff interpretation No C. difficile detected.  Final    Comment: Performed at [facility], [place] [address], [place], [place] [address]  Culture, Respiratory w Gram Stain     Status: None   Collection Time: 01/24/21  8:57 AM   Specimen: Tracheal Aspirate; Respiratory  Result Value Ref Range Status   Specimen Description TRACHEAL ASPIRATE  Final   Special Requests NONE  Final   Gram Stain   Final    ABUNDANT WBC PRESENT, PREDOMINANTLY MONONUCLEAR RARE FEW GRAM NEGATIVE RODS    Culture   Final    ABUNDANT PSEUDOMONAS AERUGINOSA Two isolates with different morphologies were identified as the same organism.The most resistant organism was reported. Performed at [facility], [place] [address] [place],  [address]    Report Status 01/27/2021 FINAL  Final  Organism ID, Bacteria PSEUDOMONAS AERUGINOSA  Final      Susceptibility   Pseudomonas aeruginosa - MIC*    CEFTAZIDIME 16 INTERMEDIATE Intermediate     CIPROFLOXACIN 1 SENSITIVE Sensitive     GENTAMICIN <=1 SENSITIVE Sensitive     IMIPENEM 2  SENSITIVE Sensitive     * ABUNDANT PSEUDOMONAS AERUGINOSA    Coagulation Studies: No results for input(s): LABPROT, INR in the last 72 hours.  Urinalysis: No results for input(s): COLORURINE, LABSPEC, PHURINE, GLUCOSEU, HGBUR, BILIRUBINUR, KETONESUR, PROTEINUR, UROBILINOGEN, NITRITE, LEUKOCYTESUR in the last 72 hours.  Invalid input(s): APPERANCEUR    Imaging: No results found.   Medications:    sodium chloride     albumin human 25 g (02/06/21 1045)   ceFAZolin     dextrose 5% lactated ringers Stopped (02/04/21 1907)    sodium chloride   Intravenous Once   acetaminophen  1,000 mg Per Tube Q6H   amiodarone  200 mg Oral BID   Followed by   Derrill Memo ON 02/15/2021] amiodarone  200 mg Oral Daily   apixaban  5 mg Per Tube BID   chlorhexidine  15 mL Mouth Rinse BID   Chlorhexidine Gluconate Cloth  6 each Topical Q0600   Chlorhexidine Gluconate Cloth  6 each Topical Q0600   docusate  100 mg Per Tube BID   feeding supplement (NEPRO CARB STEADY)  1,000 mL Per Tube Q24H   feeding supplement (NEPRO CARB STEADY)  237 mL Oral BID BM   feeding supplement (PROSource TF)  45 mL Per Tube BID   guaiFENesin  15 mL Per Tube Q4H   insulin aspart  0-20 Units Subcutaneous Q4H   insulin glargine-yfgn  60 Units Subcutaneous BID   mouth rinse  15 mL Mouth Rinse q12n4p   methocarbamol  1,000 mg Per Tube Q8H   metoprolol tartrate  25 mg Per Tube BID   pantoprazole sodium  40 mg Per Tube Daily   polyethylene glycol  17 g Per Tube Daily   QUEtiapine  25 mg Per Tube QHS   senna  1 tablet Per Tube Daily   sodium chloride flush  10-40 mL Intracatheter Q12H   Place/Maintain arterial line **AND** sodium chloride, albumin human, artificial tears, ceFAZolin, fentaNYL, heparin, hydrALAZINE, HYDROmorphone (DILAUDID) injection, midazolam, midazolam, ondansetron **OR** ondansetron (ZOFRAN) IV, oxyCODONE, sodium chloride flush  Assessment/ Plan:   #Acute kidney injury, oliguric: Multifactorial etiology  including ischemic ATN in the setting of hypotension, sepsis complicated by contrast injury. CRRT from 9/1-9/9.  No heparin as he is on bivalirudin.  The HD catheter was changed on 9/7.  CRRT restarted on 9/11 given elevated BUN and with more confusion, did not tolerated IHD on 9/10 (confusion, hypotension). CRRT clotted off on 9/13.   -HD 02/08/2021 Next dialysis 02/11/2021 we will continue to monitor for recovery with daily renal panel      #Fall/bilateral subarachnoid hemorrhage/SDH, TBI/occipital and temporal bone fracture: Per trauma team.  Repeat CT scan with no acute finding.   #Acute respiratory failure: Status post trach on 8/29, trach collar   #A. fib with RVR: On amiodarone, Angiomax.   # Anemia of critical illness: Transfuse as needed.   #Metabolic acidosis: Managed with dialysis. resolved   #Bilateral pulm embolism: Currently on anticoagulation.   #Acute febrile illness: Per primary team.   #Hyperkalemia:  K wnl now, managing with HD    LOS: [place] @TODAY @8 :48 AM

## 2021-02-10 DIAGNOSIS — I2699 Other pulmonary embolism without acute cor pulmonale: Secondary | ICD-10-CM | POA: Diagnosis not present

## 2021-02-10 DIAGNOSIS — J9601 Acute respiratory failure with hypoxia: Secondary | ICD-10-CM | POA: Diagnosis not present

## 2021-02-10 DIAGNOSIS — S066X9A Traumatic subarachnoid hemorrhage with loss of consciousness of unspecified duration, initial encounter: Secondary | ICD-10-CM | POA: Diagnosis not present

## 2021-02-10 DIAGNOSIS — Z20822 Contact with and (suspected) exposure to covid-19: Secondary | ICD-10-CM | POA: Diagnosis not present

## 2021-02-10 LAB — RENAL FUNCTION PANEL
Albumin: 2.3 g/dL — ABNORMAL LOW (ref 3.5–5.0)
Anion gap: 12 (ref 5–15)
BUN: 76 mg/dL — ABNORMAL HIGH (ref 8–23)
CO2: 25 mmol/L (ref 22–32)
Calcium: 9.1 mg/dL (ref 8.9–10.3)
Chloride: 95 mmol/L — ABNORMAL LOW (ref 98–111)
Creatinine, Ser: 1.77 mg/dL — ABNORMAL HIGH (ref 0.61–1.24)
GFR, Estimated: 41 mL/min — ABNORMAL LOW (ref >60)
Glucose, Bld: 148 mg/dL — ABNORMAL HIGH (ref 70–99)
Phosphorus: 3.7 mg/dL (ref 2.5–4.6)
Potassium: 3.4 mmol/L — ABNORMAL LOW (ref 3.5–5.1)
Sodium: 132 mmol/L — ABNORMAL LOW (ref 135–145)

## 2021-02-10 LAB — GLUCOSE, CAPILLARY
Glucose-Capillary: 100 mg/dL — ABNORMAL HIGH (ref 70–99)
Glucose-Capillary: 143 mg/dL — ABNORMAL HIGH (ref 70–99)
Glucose-Capillary: 145 mg/dL — ABNORMAL HIGH (ref 70–99)
Glucose-Capillary: 149 mg/dL — ABNORMAL HIGH (ref 70–99)
Glucose-Capillary: 159 mg/dL — ABNORMAL HIGH (ref 70–99)
Glucose-Capillary: 174 mg/dL — ABNORMAL HIGH (ref 70–99)
Glucose-Capillary: 81 mg/dL (ref 70–99)

## 2021-02-10 LAB — MAGNESIUM: Magnesium: 2.1 mg/dL (ref 1.7–2.4)

## 2021-02-10 NOTE — Progress Notes (Addendum)
Trauma/Critical Care Follow Up Note  Subjective:    Overnight Issues:  None. Getting calorie counts. Denies pain.   Objective:  Vital signs for last 24 hours: Temp:  [97.8 F (36.6 C)-98.8 F (37.1 C)] 98.8 F (37.1 C) (09/25 0703) Pulse Rate:  [71-80] 80 (09/25 0800) Resp:  [16-20] 16 (09/25 1020) BP: (96-138)/(54-64) 136/64 (09/25 0800) SpO2:  [94 %-97 %] 95 % (09/25 0726) FiO2 (%):  [21 %] 21 % (09/25 0726) Weight:  [107.5 kg] 107.5 kg (09/25 0559)   Intake/Output from previous day: 09/24 0701 - 09/25 0700 In: 600 [P.O.:600] Out: -   Intake/Output this shift: Total I/O In: 120 [P.O.:120] Out: -   Vent settings for last 24 hours: FiO2 (%):  [21 %] 21 %  Physical Exam:  Gen: looks uncomfortable. Neuro: non-focal exam HEENT: PERRL Neck: supple CV: RRR Pulm: unlabored breathing, coughing and clearing secretionf.  Abd: soft, NT, protuberant GU: clear yellow urine Extr: wwp, no edema   Results for orders placed or performed during the hospital encounter of 12/28/20 (from the past 24 hour(s))  Glucose, capillary     Status: Abnormal   Collection Time: 02/09/21  3:33 PM  Result Value Ref Range   Glucose-Capillary 123 (H) 70 - 99 mg/dL  Glucose, capillary     Status: Abnormal   Collection Time: 02/09/21  7:22 PM  Result Value Ref Range   Glucose-Capillary 187 (H) 70 - 99 mg/dL  Glucose, capillary     Status: Abnormal   Collection Time: 02/09/21 11:59 PM  Result Value Ref Range   Glucose-Capillary 159 (H) 70 - 99 mg/dL  Glucose, capillary     Status: Abnormal   Collection Time: 02/10/21  4:12 AM  Result Value Ref Range   Glucose-Capillary 145 (H) 70 - 99 mg/dL  Renal function panel     Status: Abnormal   Collection Time: 02/10/21  5:00 AM  Result Value Ref Range   Sodium 132 (L) 135 - 145 mmol/L   Potassium 3.4 (L) 3.5 - 5.1 mmol/L   Chloride 95 (L) 98 - 111 mmol/L   CO2 25 22 - 32 mmol/L   Glucose, Bld 148 (H) 70 - 99 mg/dL   BUN 76 (H) 8 - 23 mg/dL    Creatinine, Ser 1.77 (H) 0.61 - 1.24 mg/dL   Calcium 9.1 8.9 - 10.3 mg/dL   Phosphorus 3.7 2.5 - 4.6 mg/dL   Albumin 2.3 (L) 3.5 - 5.0 g/dL   GFR, Estimated 41 (L) >60 mL/min   Anion gap 12 5 - 15  Magnesium     Status: None   Collection Time: 02/10/21  5:00 AM  Result Value Ref Range   Magnesium 2.1 1.7 - 2.4 mg/dL  Glucose, capillary     Status: None   Collection Time: 02/10/21  7:53 AM  Result Value Ref Range   Glucose-Capillary 81 70 - 99 mg/dL   Comment 1 Notify RN    Comment 2 Document in Chart   Glucose, capillary     Status: Abnormal   Collection Time: 02/10/21 11:13 AM  Result Value Ref Range   Glucose-Capillary 143 (H) 70 - 99 mg/dL   Comment 1 Notify RN    Comment 2 Document in Chart     Assessment & Plan:    LOS: 44 days   Additional comments:I reviewed the patient's new clinical lab test results.   and I reviewed the patients new imaging test results.    Fall down stairs 8/12  VDRF - guaifenisen, S/P trach 8/29 by Dr. Bobbye Morton. Has tolerated HTC well, passy muir trials.  Trach changed to cuffless #6 on 9/20. Hope to downsize to #4 cuffless soon ID - off abx, no fevers TBI/SAH/SDH - NSGY c/s, Dr. Annette Stable. Significant frontal lobe injuries. Keppra x7d for sz ppx (completed) Occipital bone fx - NSGY c/s, Dr. Annette Stable Temporal bone fx extending into middle ear - ENT c/s, Dr. Constance Holster, no acute treatment, will need re-eval hearing and facial nerve  Right TM Rupture - ENT c/s, Dr. Phoebe Sharps -  per Cardiology, IV amio transitioned to 200 mg amio per tube 9/21. lopressor added per Cardiology 9/17, increased to 25 mg TID 9/22. Qtc 427 from 476 9/23, improving, monitor ABL anemia- 1u PRBC 9/19, Hb 7.3 from 7.5, monitor Bilateral pulmonary embolism - Eliquis AKI - HD per Renal, IR placed tunneled HD cath 9/19. HD 9/23, currently MWF Hx DM2 - resistant SSI, novolog q4, glargine 60u BID Hx HTN - PRN meds FEN - tolerating TF,  FEES 9/21 >> now on DYS2 diet, nocturnal TF and  calorie count VTE - SCDs, Eliquis Dispo - HTC, therapies, DYS 2 diet w/ nocturnal tube feeds, monitor QTC, insurance approved SNF/LTACH, awaiting HD bed  Milus Height, MD FACS Surgical Oncology, General Surgery, Trauma and [facility], [place] for weekday/non holidays Check [email or web address] for coverage night/weekend/holidays  Do not use SecureChat as it is not reliable for timely patient care.     02/10/2021  *Care during the described time interval was provided by me. I have reviewed this patient's available data, including medical history, events of note, physical examination and test results as part of my evaluation.

## 2021-02-10 NOTE — Discharge Instructions (Signed)

## 2021-02-10 NOTE — Progress Notes (Signed)
[facility] ROUNDING NOTE   Subjective:   Interval History: This is a 70 year old gentleman who presented 12/28/2020 with a traumatic brain injury subarachnoid hemorrhage subdural hemorrhage temporal bone fracture extending into the middle area occipital bone fracture and right tympanic membrane rupture.  Hospital course was complicated by pulmonary embolus 01/09/2021.  He had acute kidney injury and was started on CVVHD 01/18/2021.  He was transitioned to acute intermittent hemodialysis.  He does not appear to have recovery of renal function.  His last dialysis was 02/08/2021 with 2.5 L removed.  Continue to monitor for recovery.  Blood pressure 136/64 pulse 77 temperature 98.8 O2 sats 96% 21% oxygen through trach.  May be started to make a little urine.  Urine output recorded 02/07/2022.  We will continue to follow monitor for recovery  Sodium 132 K 3.4  BUN 76  Cr 1.77 Glc 148   Alb 2.1    Objective:  Vital signs in last 24 hours:  Temp:  [97.8 F (36.6 C)-98.8 F (37.1 C)] 98.8 F (37.1 C) (09/25 0703) Pulse Rate:  [71-80] 80 (09/25 0800) Resp:  [14-20] 16 (09/25 0726) BP: (96-138)/(54-81) 136/64 (09/25 0800) SpO2:  [94 %-97 %] 95 % (09/25 0726) FiO2 (%):  [21 %] 21 % (09/25 0726) Weight:  [107.5 kg] 107.5 kg (09/25 0559)  Weight change: -0.4 kg Filed Weights   02/08/21 1715 02/09/21 0538 02/10/21 0559  Weight: 106.5 kg 107 kg 107.5 kg    Intake/Output: I/O last 3 completed shifts: In: 600 [P.O.:600] Out: -    Intake/Output this shift:  No intake/output data recorded.  General: nad, tracheostomy Heart: rrr Lungs: trach, cta bl Abdomen:soft, nontender. Extremities: trace dependent edema. Neurology:Alert awake  Dialysis Access: Left subclavian temporary HD catheter placed on 9/7   Basic Metabolic Panel: Recent Labs  Lab 02/06/21 0426 02/06/21 0427 02/06/21 1720 02/07/21 0430 02/08/21 0410 02/09/21 0155 02/10/21 0500  NA  --  132* 134* 135 132* 130*  132*  K  --  3.1* 3.8 3.7 3.2* 4.0 3.4*  CL  --  96* 97* 96* 95* 96* 95*  CO2  --  22 26 26 26 23 25   GLUCOSE  --  153* 128* 121* 121* 160* 148*  BUN  --  108* 48* 66* 89* 56* 76*  CREATININE  --  2.44* 1.50* 1.94* 2.00* 1.73* 1.77*  CALCIUM  --  8.9 8.7* 9.1 8.9 8.8* 9.1  MG 2.4  --   --  2.1 2.2 2.1 2.1  PHOS  --  5.2* 2.6 3.8 4.2  --  3.7     Liver Function Tests: Recent Labs  Lab 02/06/21 0427 02/06/21 1720 02/07/21 0430 02/08/21 0410 02/10/21 0500  ALBUMIN 2.2* 2.7* 2.6* 2.4* 2.3*    No results for input(s): LIPASE, AMYLASE in the last 168 hours. No results for input(s): AMMONIA in the last 168 hours.  CBC: Recent Labs  Lab 02/05/21 0010 02/06/21 0426 02/07/21 0430 02/08/21 0410 02/09/21 0155  WBC 6.9 6.6 7.2 5.9 7.9  HGB 8.7* 7.7* 7.5* 7.3* 8.2*  HCT 27.3* 25.4* 24.2* 23.2* 26.6*  MCV 83.7 86.1 84.6 84.7 85.5  PLT 323 356 323 348 386     Cardiac Enzymes: No results for input(s): CKTOTAL, CKMB, CKMBINDEX, TROPONINI in the last 168 hours.  BNP: Invalid input(s): POCBNP  CBG: Recent Labs  Lab 02/09/21 1533 02/09/21 1922 02/09/21 2359 02/10/21 0412 02/10/21 0753  GLUCAP 123* 187* 159* 145* 81     Microbiology: Results for orders  placed or performed during the hospital encounter of 12/28/20  Resp Panel by RT-PCR (Flu A&B, Covid) Nasopharyngeal Swab     Status: None   Collection Time: 12/28/20  4:17 PM   Specimen: Nasopharyngeal Swab; Nasopharyngeal(NP) swabs in vial transport medium  Result Value Ref Range Status   SARS Coronavirus 2 by RT PCR NEGATIVE NEGATIVE Final    Comment: (NOTE) SARS-CoV-2 target nucleic acids are NOT DETECTED.  The SARS-CoV-2 RNA is generally detectable in upper respiratory specimens during the acute phase of infection. The lowest concentration of SARS-CoV-2 viral copies this assay can detect is 138 copies/mL. A negative result does not preclude SARS-Cov-2 infection and should not be used as the sole basis for  treatment or other patient management decisions. A negative result may occur with  improper specimen collection/handling, submission of specimen other than nasopharyngeal swab, presence of viral mutation(s) within the areas targeted by this assay, and inadequate number of viral copies(<138 copies/mL). A negative result must be combined with clinical observations, patient history, and epidemiological information. The expected result is Negative.  Fact Sheet for Patients:  EntrepreneurPulse.com.au  Fact Sheet for Healthcare Providers:  IncredibleEmployment.be  This test is no t yet approved or cleared by the [place] FDA and  has been authorized for detection and/or diagnosis of SARS-CoV-2 by FDA under an Emergency Use Authorization (EUA). This EUA will remain  in effect (meaning this test can be used) for the duration of the COVID-19 declaration under Section 564(b)(1) of the Act, 21 U.S.C.section 360bbb-3(b)(1), unless the authorization is terminated  or revoked sooner.       Influenza A by PCR NEGATIVE NEGATIVE Final   Influenza B by PCR NEGATIVE NEGATIVE Final    Comment: (NOTE) The Xpert Xpress SARS-CoV-2/FLU/RSV plus assay is intended as an aid in the diagnosis of influenza from Nasopharyngeal swab specimens and should not be used as a sole basis for treatment. Nasal washings and aspirates are unacceptable for Xpert Xpress SARS-CoV-2/FLU/RSV testing.  Fact Sheet for Patients: EntrepreneurPulse.com.au  Fact Sheet for Healthcare Providers: IncredibleEmployment.be  This test is not yet approved or cleared by the [place] FDA and has been authorized for detection and/or diagnosis of SARS-CoV-2 by FDA under an Emergency Use Authorization (EUA). This EUA will remain in effect (meaning this test can be used) for the duration of the COVID-19 declaration under Section 564(b)(1) of the Act, 21  U.S.C. section 360bbb-3(b)(1), unless the authorization is terminated or revoked.  Performed at [facility], [place] [address], [place], [place] [address]   MRSA Next Gen by PCR, Nasal     Status: None   Collection Time: 12/28/20  7:32 PM   Specimen: Nasal Mucosa; Nasal Swab  Result Value Ref Range Status   MRSA by PCR Next Gen NOT DETECTED NOT DETECTED Final    Comment: (NOTE) The GeneXpert MRSA Assay (FDA approved for NASAL specimens only), is one component of a comprehensive MRSA colonization surveillance program. It is not intended to diagnose MRSA infection nor to guide or monitor treatment for MRSA infections. Test performance is not FDA approved in patients less than 34 years old. Performed at [facility], [place] [address] [place], [place] [address]   Culture, Respiratory w Gram Stain     Status: None   Collection Time: 12/31/20 11:06 AM   Specimen: Tracheal Aspirate; Respiratory  Result Value Ref Range Status   Specimen Description TRACHEAL ASPIRATE  Final   Special Requests NONE  Final   Gram  Stain   Final    FEW SQUAMOUS EPITHELIAL CELLS PRESENT FEW WBC PRESENT,BOTH PMN AND MONONUCLEAR FEW GRAM POSITIVE COCCI Performed at [facility], [place] [address], [place], [place] [address]    Culture   Final    FEW PSEUDOMONAS AERUGINOSA FEW STREPTOCOCCUS PNEUMONIAE    Report Status 01/03/2021 FINAL  Final   Organism ID, Bacteria PSEUDOMONAS AERUGINOSA  Final   Organism ID, Bacteria STREPTOCOCCUS PNEUMONIAE  Final      Susceptibility   Pseudomonas aeruginosa - MIC*    CEFTAZIDIME 4 SENSITIVE Sensitive     CIPROFLOXACIN <=0.25 SENSITIVE Sensitive     GENTAMICIN <=1 SENSITIVE Sensitive     IMIPENEM 2 SENSITIVE Sensitive     PIP/TAZO 8 SENSITIVE Sensitive     CEFEPIME 2 SENSITIVE Sensitive     * FEW PSEUDOMONAS AERUGINOSA   Streptococcus pneumoniae - MIC*    ERYTHROMYCIN 4 RESISTANT Resistant     LEVOFLOXACIN 0.5 SENSITIVE Sensitive     VANCOMYCIN <=0.12  SENSITIVE Sensitive     PENO - penicillin <=0.06      PENICILLIN (non-meningitis) <=0.06 SENSITIVE Sensitive     PENICILLIN (oral) <=0.06 SENSITIVE Sensitive     CEFTRIAXONE (non-meningitis) <=0.12 SENSITIVE Sensitive     * FEW STREPTOCOCCUS PNEUMONIAE  Culture, blood (routine x 2)     Status: None   Collection Time: 01/05/21 11:44 AM   Specimen: BLOOD  Result Value Ref Range Status   Specimen Description BLOOD SITE NOT SPECIFIED  Final   Special Requests AEROBIC BOTTLE ONLY Blood Culture adequate volume  Final   Culture   Final    NO GROWTH 5 DAYS Performed at [facility], [address] [place], [place] [address]    Report Status 01/10/2021 FINAL  Final  Culture, blood (routine x 2)     Status: None   Collection Time: 01/05/21 11:44 AM   Specimen: BLOOD  Result Value Ref Range Status   Specimen Description BLOOD SITE NOT SPECIFIED  Final   Special Requests   Final    AEROBIC BOTTLE ONLY Blood Culture results may not be optimal due to an inadequate volume of blood received in culture bottles   Culture   Final    NO GROWTH 5 DAYS Performed at [facility], [place] [address] [place], [place] [address]    Report Status 01/10/2021 FINAL  Final  Surgical PCR screen     Status: None   Collection Time: 01/08/21 12:14 AM   Specimen: Nasal Mucosa; Nasal Swab  Result Value Ref Range Status   MRSA, PCR NEGATIVE NEGATIVE Final   Staphylococcus aureus NEGATIVE NEGATIVE Final    Comment: (NOTE) The Xpert SA Assay (FDA approved for NASAL specimens in patients 89 years of age and older), is one component of a comprehensive surveillance program. It is not intended to diagnose infection nor to guide or monitor treatment. Performed at [facility], [place] [address] [place], [place] [address]   Culture, Respiratory w Gram Stain     Status: None   Collection Time: 01/08/21  1:24 PM   Specimen: Tracheal Aspirate; Respiratory  Result Value Ref Range Status   Specimen  Description TRACHEAL ASPIRATE  Final   Special Requests NONE  Final   Gram Stain   Final    RARE SQUAMOUS EPITHELIAL CELLS PRESENT MODERATE WBC PRESENT, PREDOMINANTLY MONONUCLEAR FEW GRAM NEGATIVE RODS Performed at [facility], [place] [address], [place], [place] [address]  Culture   Final    RARE PSEUDOMONAS AERUGINOSA RARE ENTEROCOCCUS FAECALIS    Report Status 01/11/2021 FINAL  Final   Organism ID, Bacteria PSEUDOMONAS AERUGINOSA  Final   Organism ID, Bacteria ENTEROCOCCUS FAECALIS  Final      Susceptibility   Enterococcus faecalis - MIC*    AMPICILLIN <=2 SENSITIVE Sensitive     VANCOMYCIN 1 SENSITIVE Sensitive     GENTAMICIN SYNERGY SENSITIVE Sensitive     * RARE ENTEROCOCCUS FAECALIS   Pseudomonas aeruginosa - MIC*    CEFTAZIDIME 4 SENSITIVE Sensitive     CIPROFLOXACIN <=0.25 SENSITIVE Sensitive     GENTAMICIN <=1 SENSITIVE Sensitive     IMIPENEM 2 SENSITIVE Sensitive     PIP/TAZO 8 SENSITIVE Sensitive     CEFEPIME 2 SENSITIVE Sensitive     * RARE PSEUDOMONAS AERUGINOSA  Gastrointestinal Panel by PCR , Stool     Status: None   Collection Time: 01/08/21  5:04 PM   Specimen: Stool  Result Value Ref Range Status   Campylobacter species NOT DETECTED NOT DETECTED Final   Plesimonas shigelloides NOT DETECTED NOT DETECTED Final   Salmonella species NOT DETECTED NOT DETECTED Final   Yersinia enterocolitica NOT DETECTED NOT DETECTED Final   Vibrio species NOT DETECTED NOT DETECTED Final   Vibrio cholerae NOT DETECTED NOT DETECTED Final   Enteroaggregative E coli (EAEC) NOT DETECTED NOT DETECTED Final   Enteropathogenic E coli (EPEC) NOT DETECTED NOT DETECTED Final   Enterotoxigenic E coli (ETEC) NOT DETECTED NOT DETECTED Final   Shiga like toxin producing E coli (STEC) NOT DETECTED NOT DETECTED Final   Shigella/Enteroinvasive E coli (EIEC) NOT DETECTED NOT DETECTED Final   Cryptosporidium NOT DETECTED NOT DETECTED Final   Cyclospora cayetanensis NOT DETECTED NOT  DETECTED Final   Entamoeba histolytica NOT DETECTED NOT DETECTED Final   Giardia lamblia NOT DETECTED NOT DETECTED Final   Adenovirus F40/41 NOT DETECTED NOT DETECTED Final   Astrovirus NOT DETECTED NOT DETECTED Final   Norovirus GI/GII NOT DETECTED NOT DETECTED Final   Rotavirus A NOT DETECTED NOT DETECTED Final   Sapovirus (I, II, IV, and V) NOT DETECTED NOT DETECTED Final    Comment: Performed at [facility], [place] [place], [place] [address]  C Difficile Quick Screen (NO PCR Reflex)     Status: None   Collection Time: 01/09/21 11:07 AM   Specimen: STOOL  Result Value Ref Range Status   C Diff antigen NEGATIVE NEGATIVE Final   C Diff toxin NEGATIVE NEGATIVE Final   C Diff interpretation No C. difficile detected.  Final    Comment: Performed at [facility], [place] [address] [place], [place] [address]  Culture, Respiratory w Gram Stain     Status: None   Collection Time: 01/24/21  8:57 AM   Specimen: Tracheal Aspirate; Respiratory  Result Value Ref Range Status   Specimen Description TRACHEAL ASPIRATE  Final   Special Requests NONE  Final   Gram Stain   Final    ABUNDANT WBC PRESENT, PREDOMINANTLY MONONUCLEAR RARE FEW GRAM NEGATIVE RODS    Culture   Final    ABUNDANT PSEUDOMONAS AERUGINOSA Two isolates with different morphologies were identified as the same organism.The most resistant organism was reported. Performed at [facility], [place] [address] [place], [place] [address]    Report Status 01/27/2021 FINAL  Final   Organism ID, Bacteria PSEUDOMONAS AERUGINOSA  Final      Susceptibility   Pseudomonas aeruginosa -  MIC*    CEFTAZIDIME 16 INTERMEDIATE Intermediate     CIPROFLOXACIN 1 SENSITIVE Sensitive     GENTAMICIN <=1 SENSITIVE Sensitive     IMIPENEM 2 SENSITIVE Sensitive     * ABUNDANT PSEUDOMONAS AERUGINOSA    Coagulation Studies: No results for input(s): LABPROT, INR in the last 72 hours.  Urinalysis: No results for input(s):  COLORURINE, LABSPEC, PHURINE, GLUCOSEU, HGBUR, BILIRUBINUR, KETONESUR, PROTEINUR, UROBILINOGEN, NITRITE, LEUKOCYTESUR in the last 72 hours.  Invalid input(s): APPERANCEUR    Imaging: No results found.   Medications:    sodium chloride     albumin human 25 g (02/06/21 1045)   dextrose 5% lactated ringers Stopped (02/04/21 1907)    sodium chloride   Intravenous Once   acetaminophen  1,000 mg Per Tube Q6H   amiodarone  200 mg Oral BID   Followed by   Derrill Memo ON 02/15/2021] amiodarone  200 mg Oral Daily   apixaban  5 mg Per Tube BID   chlorhexidine  15 mL Mouth Rinse BID   Chlorhexidine Gluconate Cloth  6 each Topical Q0600   Chlorhexidine Gluconate Cloth  6 each Topical Q0600   docusate  100 mg Per Tube BID   feeding supplement (NEPRO CARB STEADY)  1,000 mL Per Tube Q24H   feeding supplement (NEPRO CARB STEADY)  237 mL Oral BID BM   feeding supplement (PROSource TF)  45 mL Per Tube BID   guaiFENesin  15 mL Per Tube Q4H   insulin aspart  0-20 Units Subcutaneous Q4H   insulin glargine-yfgn  60 Units Subcutaneous BID   mouth rinse  15 mL Mouth Rinse q12n4p   methocarbamol  1,000 mg Per Tube Q8H   metoprolol tartrate  25 mg Per Tube BID   pantoprazole sodium  40 mg Per Tube Daily   polyethylene glycol  17 g Per Tube Daily   QUEtiapine  25 mg Per Tube QHS   senna  1 tablet Per Tube Daily   sodium chloride flush  10-40 mL Intracatheter Q12H   Place/Maintain arterial line **AND** sodium chloride, albumin human, artificial tears, heparin, hydrALAZINE, HYDROmorphone (DILAUDID) injection, midazolam, ondansetron **OR** ondansetron (ZOFRAN) IV, oxyCODONE, sodium chloride flush  Assessment/ Plan:   #Acute kidney injury, oliguric: Multifactorial etiology including ischemic ATN in the setting of hypotension, sepsis complicated by contrast injury. CRRT from 9/1-9/9.  No heparin as he is on bivalirudin.  The HD catheter was changed on 9/7.  CRRT restarted on 9/11 given elevated BUN and with  more confusion, did not tolerated IHD on 9/10 (confusion, hypotension). CRRT clotted off on 9/13.   - last HD 02/08/2021  we will continue to monitor for recovery with daily renal panel      #Fall/bilateral subarachnoid hemorrhage/SDH, TBI/occipital and temporal bone fracture: Per trauma team.  Repeat CT scan with no acute finding.   #Acute respiratory failure: Status post trach on 8/29, trach collar   #A. fib with RVR: On amiodarone, Angiomax.   # Anemia of critical illness: Transfuse as needed.   #Metabolic acidosis: Managed with dialysis. resolved   #Bilateral pulm embolism: Currently on anticoagulation.   #Acute febrile illness: Per primary team.   #Hyperkalemia:  K wnl now, managing with HD    LOS: [place] @TODAY @9 :22 AM

## 2021-02-11 DIAGNOSIS — J9601 Acute respiratory failure with hypoxia: Secondary | ICD-10-CM | POA: Diagnosis not present

## 2021-02-11 DIAGNOSIS — Z20822 Contact with and (suspected) exposure to covid-19: Secondary | ICD-10-CM | POA: Diagnosis not present

## 2021-02-11 DIAGNOSIS — I2699 Other pulmonary embolism without acute cor pulmonale: Secondary | ICD-10-CM | POA: Diagnosis not present

## 2021-02-11 DIAGNOSIS — S066X9A Traumatic subarachnoid hemorrhage with loss of consciousness of unspecified duration, initial encounter: Secondary | ICD-10-CM | POA: Diagnosis not present

## 2021-02-11 LAB — GLUCOSE, CAPILLARY
Glucose-Capillary: 171 mg/dL — ABNORMAL HIGH (ref 70–99)
Glucose-Capillary: 124 mg/dL — ABNORMAL HIGH (ref 70–99)
Glucose-Capillary: 121 mg/dL — ABNORMAL HIGH (ref 70–99)
Glucose-Capillary: 79 mg/dL (ref 70–99)
Glucose-Capillary: 145 mg/dL — ABNORMAL HIGH (ref 70–99)
Glucose-Capillary: 165 mg/dL — ABNORMAL HIGH (ref 70–99)

## 2021-02-11 LAB — RENAL FUNCTION PANEL
Albumin: 2.3 g/dL — ABNORMAL LOW (ref 3.5–5.0)
Anion gap: 10 (ref 5–15)
BUN: 78 mg/dL — ABNORMAL HIGH (ref 8–23)
CO2: 25 mmol/L (ref 22–32)
Calcium: 9.1 mg/dL (ref 8.9–10.3)
Chloride: 98 mmol/L (ref 98–111)
Creatinine, Ser: 1.62 mg/dL — ABNORMAL HIGH (ref 0.61–1.24)
GFR, Estimated: 45 mL/min — ABNORMAL LOW (ref >60)
Glucose, Bld: 177 mg/dL — ABNORMAL HIGH (ref 70–99)
Phosphorus: 3.9 mg/dL (ref 2.5–4.6)
Potassium: 3.3 mmol/L — ABNORMAL LOW (ref 3.5–5.1)
Sodium: 133 mmol/L — ABNORMAL LOW (ref 135–145)

## 2021-02-11 LAB — MAGNESIUM: Magnesium: 2.1 mg/dL (ref 1.7–2.4)

## 2021-02-11 MED ORDER — NEPRO/CARBSTEADY PO LIQD
1000.0000 mL | ORAL | Status: DC
  Administered 2021-02-11: 1000 mL
  Filled 2021-02-11 (×2): qty 1000

## 2021-02-11 MED ORDER — POTASSIUM CHLORIDE 20 MEQ PO PACK
40.0000 meq | PACK | Freq: Two times a day (BID) | ORAL | Status: AC
  Administered 2021-02-11 (×2): 40 meq
  Filled 2021-02-11 (×2): qty 2

## 2021-02-11 MED ORDER — NEPRO/CARBSTEADY PO LIQD
237.0000 mL | Freq: Three times a day (TID) | ORAL | Status: DC
  Administered 2021-02-12 – 2021-02-25 (×35): 237 mL via ORAL

## 2021-02-11 NOTE — Progress Notes (Signed)
Calorie Count Note  48-hour calorie count ordered. Calorie count was started on 02/08/21 at lunch meal (1200). Please see results below.  Diet: dysphagia 2, thin liquids Supplements:  - Nepro Shake po BID  Day 1: 9/23 Lunch: 356 kcal, 16 grams of protein 9/23 Dinner: no documentation available 9/24 Breakfast: 383 kcal, 11 grams of protein Supplements: 425 kcal, 19 grams of protein  Day 1 total 24-hour intake: 1164 kcal (47% of minimum estimated needs)  46 grams of protein (35% of minimum estimated needs)   Day 2: 9/24 Lunch: 207 kcal, 5 grams of protein 9/24 Dinner: 339 kcal, 8 grams of protein 9/25 Breakfast: 328 kcal, 13 grams of protein Supplements: 850 kcal, 38 grams of protein  Day 2 total 24-hour intake: 1724 kcal (69% of minimum estimated needs)  64 grams of protein (49% of minimum estimated needs)  Nutrition Diagnosis: Inadequate oral intake related to inability to eat as evidenced by NPO status. -progressing  Goal: Patient will meet greater than or equal to 90% of their needs  Intervention:  - d/c 48-hour calorie count - Increase Nepro Shake to TID - Continue nocturnal tube feeds but decrease rate to Nepro @ 60 ml/hr x 12 hours from 1800 to 0600 (total of 720 ml) - Continue ProSource TF 45 ml BID  Nocturnal tube feeding regimen provides 1376 kcal, 80 grams of protein, and 523 ml of H2O (meets 55% of kcal needs and 62% of protein needs).   Gustavus Bryant, MS, RD, LDN Inpatient Clinical Dietitian Please see AMiON for contact information.

## 2021-02-11 NOTE — Plan of Care (Signed)

## 2021-02-11 NOTE — Progress Notes (Signed)
Patient ID: DARLENE BARTELT, male   DOB: [birth year], 70 y.o.   MRN: [id number]  [facility] Progress Note  28 Days Post-Op  Subjective: CC-  Does not want to open eyes this morning, but nods head appropriately to questions. Has been on nocturnal tube feedings and D2 diet. RD to follow up today regarding calorie counts.  Objective: Vital signs in last 24 hours: Temp:  [98 F (36.7 C)-99.2 F (37.3 C)] 98.8 F (37.1 C) (09/26 0413) Pulse Rate:  [67-90] 75 (09/26 0827) Resp:  [14-20] 18 (09/26 0827) BP: (135-142)/(63-80) 137/74 (09/26 0827) SpO2:  [95 %-98 %] 98 % (09/26 0827) FiO2 (%):  [21 %] 21 % (09/26 0827) Weight:  [105.5 kg] 105.5 kg (09/26 0423) Last BM Date: 02/10/21  Intake/Output from previous day: 09/25 0701 - 09/26 0700 In: 4099 [P.O.:120; NG/GT:3979] Out: -  Intake/Output this shift: No intake/output data recorded.  PE: Gen:  Alert but drowsy, NAD HEENT: EOM's intact, pupils equal and round. Cortrak in nare. Trach collar Card:  RRR, no M/G/R heard, palpable pedal pulses Pulm:  CTAB, no W/R/R, rate and effort normal Abd: Soft, protuberant, nontender, +BS Ext:  calves soft and nontender Neuro: follows commands Skin: no rashes noted, warm and dry  Lab Results:  Recent Labs    02/09/21 0155  WBC 7.9  HGB 8.2*  HCT 26.6*  PLT 386   BMET Recent Labs    02/10/21 0500 02/11/21 0452  NA 132* 133*  K 3.4* 3.3*  CL 95* 98  CO2 25 25  GLUCOSE 148* 177*  BUN 76* 78*  CREATININE 1.77* 1.62*  CALCIUM 9.1 9.1   PT/INR No results for input(s): LABPROT, INR in the last 72 hours. CMP     Component Value Date/Time   NA 133 (L) 02/11/2021 0452   K 3.3 (L) 02/11/2021 0452   CL 98 02/11/2021 0452   CO2 25 02/11/2021 0452   GLUCOSE 177 (H) 02/11/2021 0452   BUN 78 (H) 02/11/2021 0452   CREATININE 1.62 (H) 02/11/2021 0452   CALCIUM 9.1 02/11/2021 0452   PROT 5.7 (L) 01/10/2021 1346   ALBUMIN 2.3 (L) 02/11/2021 0452   AST 30 01/10/2021 1346    ALT 34 01/10/2021 1346   ALKPHOS 53 01/10/2021 1346   BILITOT 0.4 01/10/2021 1346   GFRNONAA 45 (L) 02/11/2021 0452   Lipase  No results found for: LIPASE     Studies/Results: No results found.  Anti-infectives: Anti-infectives (From admission, onward)    Start     Dose/Rate Route Frequency Ordered Stop   02/04/21 1548  ceFAZolin (ANCEF) IVPB 2g/100 mL premix  Status:  Discontinued        over 30 Minutes  Continuous PRN 02/04/21 1549 02/09/21 1305   02/04/21 1545  ceFAZolin (ANCEF) IVPB 1 g/50 mL premix        1 g 100 mL/hr over 30 Minutes Intravenous  Once 02/04/21 1458 02/04/21 1600   02/04/21 1543  ceFAZolin (ANCEF) 2-4 GM/100ML-% IVPB       Note to Pharmacy: Lytle Butte   : cabinet override      02/04/21 1543 02/04/21 1651   01/11/21 2330  ceFEPIme (MAXIPIME) 2 g in sodium chloride 0.9 % 100 mL IVPB  Status:  Discontinued        2 g 200 mL/hr over 30 Minutes Intravenous Every 24 hours 01/11/21 0711 01/16/21 0907   01/10/21 1645  ampicillin (OMNIPEN) 2 g in sodium chloride 0.9 % 100 mL IVPB  Status:  Discontinued        2 g 300 mL/hr over 20 Minutes Intravenous Every 8 hours 01/10/21 1549 01/16/21 0907   01/09/21 2200  ceFEPIme (MAXIPIME) 2 g in sodium chloride 0.9 % 100 mL IVPB  Status:  Discontinued        2 g 200 mL/hr over 30 Minutes Intravenous Every 12 hours 01/09/21 1458 01/11/21 0711   01/08/21 1515  metroNIDAZOLE (FLAGYL) IVPB 500 mg  Status:  Discontinued        500 mg 100 mL/hr over 60 Minutes Intravenous Every 8 hours 01/08/21 1428 01/10/21 1618   01/03/21 0600  vancomycin (VANCOREADY) IVPB 1250 mg/250 mL  Status:  Discontinued        1,250 mg 166.7 mL/hr over 90 Minutes Intravenous Every 12 hours 01/02/21 1717 01/03/21 0837   01/02/21 1800  vancomycin (VANCOREADY) IVPB 2000 mg/400 mL        2,000 mg 200 mL/hr over 120 Minutes Intravenous  Once 01/02/21 1712 01/02/21 2007   01/02/21 0900  ceFEPIme (MAXIPIME) 2 g in sodium chloride 0.9 % 100 mL IVPB   Status:  Discontinued        2 g 200 mL/hr over 30 Minutes Intravenous Every 8 hours 01/02/21 0849 01/09/21 1458        Assessment/Plan Fall down stairs 8/12   VDRF - guaifenisen, S/P trach 8/29 by Dr. Bobbye Morton. Has tolerated HTC well, passy muir trials.  Trach changed to cuffless #6 on 9/20. Hope to downsize to #4 cuffless soon ID - off abx, no fevers TBI/SAH/SDH - NSGY c/s, Dr. Annette Stable. Significant frontal lobe injuries. Keppra x7d for sz ppx (completed) Occipital bone fx - NSGY c/s, Dr. Annette Stable Temporal bone fx extending into middle ear - ENT c/s, Dr. Constance Holster, no acute treatment, will need re-eval hearing and facial nerve  Right TM Rupture - ENT c/s, Dr. Constance Holster [facility] -  cardiology s/o 9/23 with recs: metoprolol 25mg  BID,  amiodarone 200 mg BID x1 week, then decrease to 200 mg daily. Qtc 463 from 427 - monitor ABL anemia- 1u PRBC 9/19, Hb 8.2 (9/24) Bilateral pulmonary embolism - Eliquis AKI - HD per Renal, IR placed tunneled HD cath 9/19. Last HD 9/23 and they are holding today Hx DM2 - resistant SSI, novolog q4, glargine 60u BID Hx HTN - PRN meds FEN - tolerating TF,  FEES 9/21 >> now on DYS2 diet, nocturnal TF and calorie count - RD to follow up on this today. Replete K VTE - SCDs, Eliquis Dispo - HTC (will discuss downsizing with MD), continue therapies, DYS 2 diet w/ nocturnal tube feeds, monitor QTC, insurance approved SNF/LTACH   LOS: 45 days    Wellington Hampshire, [facility] Surgery 02/11/2021, 10:50 AM Please see Amion for pager number during day hours 7:00am-4:30pm

## 2021-02-11 NOTE — Progress Notes (Signed)
MD made aware they don't make a #4 shiley XLT proximal. MD said to hold off on trach change today.

## 2021-02-11 NOTE — Progress Notes (Signed)
Physical Therapy Treatment Patient Details Name: Angel [name] MRN: [id number] DOB: [birth year] Today's Date: 02/11/2021   History of Present Illness Angel [name] is a 70 y.o. 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 CT showed R temporal and parietal SAH, SAH anterior frontal lobes  bilaterally. Also sustained right occipital skull fracture, temporal bone fx, right TM rupture, bilateral PE. Intubated 8/12, trach'd 8/29. CRRT 9/1- 9/9.  PMH: DM2, HTN    PT Comments    Pt remains lethargic and to have orthostatic hypotension with transfer to EOB limiting activity tolerance. Pt following more commands and actively participates in PT session however continues to requires maxAx2 for mobility. Pt continues to need maximove for safe OOB mobility. Acute PT to cont to follow.   Recommendations for follow up therapy are one component of a multi-disciplinary discharge planning process, led by the attending physician.  Recommendations may be updated based on patient status, additional functional criteria and insurance authorization.  Follow Up Recommendations  SNF     Equipment Recommendations  None recommended by PT (TBD at next venue)    Recommendations for Other Services Rehab consult     Precautions / Restrictions Precautions Precautions: Fall Precaution Comments: trach collar, cortrak, watch BP HR Restrictions Weight Bearing Restrictions: No     Mobility  Bed Mobility Overal bed mobility: Needs Assistance Bed Mobility: Rolling;Sidelying to Sit;Sit to Supine Rolling: Max assist Sidelying to sit: Max assist;HOB elevated   Sit to supine: Max assist;+2 for physical assistance   General bed mobility comments: max verbal and tactile cues with hand over hand assist to complete transfer, pt was able to bring LEs off EOB however required hand over hand assist to bring R UE across body to grab bed rail, maxA for trunk elevation to EOB    Transfers                 General  transfer comment: deferred due to pt BP dropping to 89/45 from 137/74. HR stable, pt lethargic and kept needing verbal cues to stay awake  Ambulation/Gait             General Gait Details: unable at this time   Stairs             Wheelchair Mobility    Modified Rankin (Stroke Patients Only) Modified Rankin (Stroke Patients Only) Pre-Morbid Rankin Score: No symptoms Modified Rankin: Severe disability     Balance Overall balance assessment: Needs assistance Sitting-balance support: Bilateral upper extremity supported;Feet supported Sitting balance-Leahy Scale: Poor Sitting balance - Comments: pt kept faling asleep in sitting and falling forward requiring assist to stay upright, pt also with R lateral lean. pt requiring modA to maintain EOB balance on R side Postural control: Right lateral lean                                  Cognition Arousal/Alertness: Lethargic Behavior During Therapy: Flat affect Overall Cognitive Status: Impaired/Different from baseline                 Rancho Levels of Cognitive Functioning Rancho [employer] Scales of Cognitive Functioning: Confused/appropriate Orientation Level:  (pt able to choose correct date, place, and situation when given 2 choices ie. may or september, hospital or restaurant however pt kept referencing being on a boat) Current Attention Level: Focused Memory: Decreased short-term memory Following Commands: Follows one step commands inconsistently;Follows one step commands with  increased time   Awareness: Intellectual   General Comments: pt kept closing eyes at EOB but would awake to name, pt kept referencing being on a boat and not wanting to sit EOB because his feet would get wet, when pt re-oriented pt stated "We are just pretending"      Exercises General Exercises - Lower Extremity Long Arc Quad: AROM;Both;10 reps;Seated    General Comments General comments (skin integrity, edema, etc.): pt  with drop in BP from 137/74 to 89/45 upon sitting EOB, pt returned to bed and HOB elevated to 48 deg, BP 78/43, HOB lowered to 38 deg, BP 94/56      Pertinent Vitals/Pain Pain Assessment: Faces Faces Pain Scale: No hurt    Home Living                      Prior Function            PT Goals (current goals can now be found in the care plan section) Progress towards PT goals: Not progressing toward goals - comment    Frequency    Min 3X/week      PT Plan Current plan remains appropriate    Co-evaluation              AM-PAC PT "6 Clicks" Mobility   Outcome Measure  Help needed turning from your back to your side while in a flat bed without using bedrails?: Total Help needed moving from lying on your back to sitting on the side of a flat bed without using bedrails?: Total Help needed moving to and from a bed to a chair (including a wheelchair)?: Total Help needed standing up from a chair using your arms (e.g., wheelchair or bedside chair)?: Total Help needed to walk in hospital room?: Total Help needed climbing 3-5 steps with a railing? : Total 6 Click Score: 6    End of Session Equipment Utilized During Treatment: Oxygen Activity Tolerance: Patient limited by pain Patient left: in bed;with call bell/phone within reach;with bed alarm set Nurse Communication: Mobility status PT Visit Diagnosis: Unsteadiness on feet (R26.81);Muscle weakness (generalized) (M62.81);Difficulty in walking, not elsewhere classified (R26.2)     Time: 2637-8588 PT Time Calculation (min) (ACUTE ONLY): 26 min  Charges:  $Therapeutic Exercise: 8-22 mins $Therapeutic Activity: 8-22 mins                     Kittie Plater, PT, DPT Acute Rehabilitation Services Pager #: [phone] Office #: [phone]    Berline Lopes 02/11/2021, 10:45 AM

## 2021-02-11 NOTE — Progress Notes (Addendum)
[facility] Progress Note   70 year old gentleman who presented 12/28/2020 with a traumatic brain injury subarachnoid hemorrhage subdural hemorrhage temporal bone fracture extending into the middle area occipital bone fracture and right tympanic membrane rupture.  Hospital course was complicated by pulmonary embolus 01/09/2021.  He had acute kidney injury and was started on CVVHD 01/18/2021.  He was transitioned to acute intermittent hemodialysis.  He does not appear to have recovery of renal function.  His last dialysis was 02/08/2021 with 2.5 L removed.  Continue to monitor for recovery.  Assessment/ Plan:    1) Acute kidney injury, oliguric: Multifactorial etiology including ischemic ATN in the setting of hypotension, sepsis complicated by contrast injury. CRRT from 9/1-9/9.  No heparin as he is on bivalirudin.  The HD catheter was changed on 9/7.  CRRT restarted on 9/11 given elevated BUN and with more confusion, did not tolerated IHD on 9/10 (confusion, hypotension). CRRT clotted off on 9/13.   - last HD 02/08/2021 (Fri) we will continue to monitor for recovery with daily renal panel   - UOP not recorded but renal function appears to have stabilized. Will hold on HD for today and evaluate daily. Will also check a bladder scan.   2) Fall/bilateral subarachnoid hemorrhage/SDH, TBI/occipital and temporal bone fracture: Per trauma team.  Repeat CT scan with no acute finding.   3) Acute respiratory failure: Status post trach on 8/29, trach collar   4) A. fib with RVR: On amiodarone, Eliquis.   5)  Anemia of critical illness: Transfuse as needed.   6) Metabolic acidosis: Managed with dialysis. resolved   7) Bilateral pulm embolism: Currently on anticoagulation.   8) Acute febrile illness: Per primary team.    Subjective:   Denies f/c/n/v/ dyspnea but feels "rough." +diarrhea   Objective:   BP (!) 142/63   Pulse 82   Temp 98.8 F (37.1 C) (Oral)   Resp 18   Ht 6\' 2"  (1.88  m)   Wt 105.5 kg   SpO2 97%   BMI 29.86 kg/m   Intake/Output Summary (Last 24 hours) at 02/11/2021 0745 Last data filed at 02/11/2021 0300 Gross per 24 hour  Intake 4099 ml  Output --  Net 4099 ml   Weight change: -2 kg  Physical Exam: General: nad, tracheostomy Heart: rrr Lungs: trach, cta bl Abdomen:soft, nontender. Extremities: trace dependent edema. Neurology:Alert awake  Dialysis Access: right IJ tunneled  HD catheter   Imaging: No results found.  Labs: BMET Recent Labs  Lab 02/05/21 1550 02/06/21 0427 02/06/21 1720 02/07/21 0430 02/08/21 0410 02/09/21 0155 02/10/21 0500 02/11/21 0452  NA 134* 132* 134* 135 132* 130* 132* 133*  K 3.3* 3.1* 3.8 3.7 3.2* 4.0 3.4* 3.3*  CL 96* 96* 97* 96* 95* 96* 95* 98  CO2 23 22 26 26 26 23 25 25   GLUCOSE 132* 153* 128* 121* 121* 160* 148* 177*  BUN 89* 108* 48* 66* 89* 56* 76* 78*  CREATININE 2.41* 2.44* 1.50* 1.94* 2.00* 1.73* 1.77* 1.62*  CALCIUM 8.9 8.9 8.7* 9.1 8.9 8.8* 9.1 9.1  PHOS 4.8* 5.2* 2.6 3.8 4.2  --  3.7 3.9   CBC Recent Labs  Lab 02/06/21 0426 02/07/21 0430 02/08/21 0410 02/09/21 0155  WBC 6.6 7.2 5.9 7.9  HGB 7.7* 7.5* 7.3* 8.2*  HCT 25.4* 24.2* 23.2* 26.6*  MCV 86.1 84.6 84.7 85.5  PLT 356 323 348 386    Medications:     sodium chloride   Intravenous Once   acetaminophen  1,000 mg Per Tube Q6H   amiodarone  200 mg Oral BID   Followed by   Derrill Memo ON 02/15/2021] amiodarone  200 mg Oral Daily   apixaban  5 mg Per Tube BID   chlorhexidine  15 mL Mouth Rinse BID   Chlorhexidine Gluconate Cloth  6 each Topical Q0600   Chlorhexidine Gluconate Cloth  6 each Topical Q0600   docusate  100 mg Per Tube BID   feeding supplement (NEPRO CARB STEADY)  1,000 mL Per Tube Q24H   feeding supplement (NEPRO CARB STEADY)  237 mL Oral BID BM   feeding supplement (PROSource TF)  45 mL Per Tube BID   guaiFENesin  15 mL Per Tube Q4H   insulin aspart  0-20 Units Subcutaneous Q4H   insulin glargine-yfgn  60 Units  Subcutaneous BID   mouth rinse  15 mL Mouth Rinse q12n4p   methocarbamol  1,000 mg Per Tube Q8H   metoprolol tartrate  25 mg Per Tube BID   pantoprazole sodium  40 mg Per Tube Daily   polyethylene glycol  17 g Per Tube Daily   QUEtiapine  25 mg Per Tube QHS   senna  1 tablet Per Tube Daily   sodium chloride flush  10-40 mL Intracatheter Q12H      Otelia Santee, MD 02/11/2021, 7:45 AM

## 2021-02-12 DIAGNOSIS — I2699 Other pulmonary embolism without acute cor pulmonale: Secondary | ICD-10-CM | POA: Diagnosis not present

## 2021-02-12 DIAGNOSIS — Z20822 Contact with and (suspected) exposure to covid-19: Secondary | ICD-10-CM | POA: Diagnosis not present

## 2021-02-12 DIAGNOSIS — S066X9A Traumatic subarachnoid hemorrhage with loss of consciousness of unspecified duration, initial encounter: Secondary | ICD-10-CM | POA: Diagnosis not present

## 2021-02-12 DIAGNOSIS — J9601 Acute respiratory failure with hypoxia: Secondary | ICD-10-CM | POA: Diagnosis not present

## 2021-02-12 LAB — RENAL FUNCTION PANEL
Albumin: 2.2 g/dL — ABNORMAL LOW (ref 3.5–5.0)
Anion gap: 7 (ref 5–15)
BUN: 69 mg/dL — ABNORMAL HIGH (ref 8–23)
CO2: 25 mmol/L (ref 22–32)
Calcium: 9.1 mg/dL (ref 8.9–10.3)
Chloride: 101 mmol/L (ref 98–111)
Creatinine, Ser: 1.35 mg/dL — ABNORMAL HIGH (ref 0.61–1.24)
GFR, Estimated: 56 mL/min — ABNORMAL LOW (ref >60)
Glucose, Bld: 127 mg/dL — ABNORMAL HIGH (ref 70–99)
Phosphorus: 3 mg/dL (ref 2.5–4.6)
Potassium: 3.7 mmol/L (ref 3.5–5.1)
Sodium: 133 mmol/L — ABNORMAL LOW (ref 135–145)

## 2021-02-12 LAB — CBC
HCT: 24.1 % — ABNORMAL LOW (ref 39.0–52.0)
Hemoglobin: 7.6 g/dL — ABNORMAL LOW (ref 13.0–17.0)
MCH: 27.3 pg (ref 26.0–34.0)
MCHC: 31.5 g/dL (ref 30.0–36.0)
MCV: 86.7 fL (ref 80.0–100.0)
Platelets: 328 10*3/uL (ref 150–400)
RBC: 2.78 MIL/uL — ABNORMAL LOW (ref 4.22–5.81)
RDW: 17.6 % — ABNORMAL HIGH (ref 11.5–15.5)
WBC: 5.1 10*3/uL (ref 4.0–10.5)
nRBC: 0 % (ref 0.0–0.2)

## 2021-02-12 LAB — GLUCOSE, CAPILLARY
Glucose-Capillary: 125 mg/dL — ABNORMAL HIGH (ref 70–99)
Glucose-Capillary: 131 mg/dL — ABNORMAL HIGH (ref 70–99)
Glucose-Capillary: 133 mg/dL — ABNORMAL HIGH (ref 70–99)
Glucose-Capillary: 185 mg/dL — ABNORMAL HIGH (ref 70–99)
Glucose-Capillary: 73 mg/dL (ref 70–99)
Glucose-Capillary: 97 mg/dL (ref 70–99)

## 2021-02-12 LAB — MAGNESIUM: Magnesium: 2 mg/dL (ref 1.7–2.4)

## 2021-02-12 MED ORDER — METHOCARBAMOL 500 MG PO TABS
1000.0000 mg | ORAL_TABLET | Freq: Three times a day (TID) | ORAL | Status: DC
  Administered 2021-02-12 – 2021-03-01 (×50): 1000 mg via ORAL
  Filled 2021-02-12 (×52): qty 2

## 2021-02-12 MED ORDER — ACETAMINOPHEN 500 MG PO TABS
1000.0000 mg | ORAL_TABLET | Freq: Four times a day (QID) | ORAL | Status: DC
  Administered 2021-02-12 – 2021-03-01 (×57): 1000 mg via ORAL
  Filled 2021-02-12 (×63): qty 2

## 2021-02-12 MED ORDER — GUAIFENESIN 100 MG/5ML PO SOLN
15.0000 mL | ORAL | Status: DC
  Administered 2021-02-12 – 2021-02-23 (×61): 300 mg via ORAL
  Administered 2021-02-23 (×3): 15 mL via ORAL
  Administered 2021-02-24 – 2021-03-01 (×28): 300 mg via ORAL
  Filled 2021-02-12 (×6): qty 20
  Filled 2021-02-12: qty 30
  Filled 2021-02-12 (×14): qty 20
  Filled 2021-02-12: qty 10
  Filled 2021-02-12: qty 30
  Filled 2021-02-12 (×5): qty 20
  Filled 2021-02-12: qty 30
  Filled 2021-02-12 (×2): qty 20
  Filled 2021-02-12: qty 30
  Filled 2021-02-12 (×3): qty 20
  Filled 2021-02-12: qty 30
  Filled 2021-02-12 (×56): qty 20

## 2021-02-12 MED ORDER — OXYCODONE HCL 5 MG PO TABS
10.0000 mg | ORAL_TABLET | ORAL | Status: DC | PRN
  Administered 2021-02-12 – 2021-02-13 (×2): 10 mg via ORAL
  Administered 2021-02-14: 15 mg via ORAL
  Administered 2021-02-14 – 2021-02-23 (×14): 10 mg via ORAL
  Administered 2021-02-24: 15 mg via ORAL
  Administered 2021-02-24: 10 mg via ORAL
  Administered 2021-02-25: 15 mg via ORAL
  Administered 2021-02-26 – 2021-02-28 (×5): 10 mg via ORAL
  Filled 2021-02-12: qty 2
  Filled 2021-02-12: qty 3
  Filled 2021-02-12 (×13): qty 2
  Filled 2021-02-12: qty 3
  Filled 2021-02-12 (×9): qty 2
  Filled 2021-02-12: qty 3

## 2021-02-12 MED ORDER — METOPROLOL TARTRATE 25 MG PO TABS
25.0000 mg | ORAL_TABLET | Freq: Two times a day (BID) | ORAL | Status: DC
  Administered 2021-02-12 – 2021-02-18 (×10): 25 mg via ORAL
  Filled 2021-02-12 (×12): qty 1

## 2021-02-12 MED ORDER — DOCUSATE SODIUM 100 MG PO CAPS
100.0000 mg | ORAL_CAPSULE | Freq: Two times a day (BID) | ORAL | Status: DC
  Administered 2021-02-13 – 2021-03-01 (×28): 100 mg via ORAL
  Filled 2021-02-12 (×29): qty 1

## 2021-02-12 MED ORDER — APIXABAN 5 MG PO TABS
5.0000 mg | ORAL_TABLET | Freq: Two times a day (BID) | ORAL | Status: DC
  Administered 2021-02-12 – 2021-03-01 (×34): 5 mg via ORAL
  Filled 2021-02-12 (×34): qty 1

## 2021-02-12 MED ORDER — QUETIAPINE FUMARATE 50 MG PO TABS
25.0000 mg | ORAL_TABLET | Freq: Every day | ORAL | Status: DC
  Administered 2021-02-12 – 2021-02-28 (×17): 25 mg via ORAL
  Filled 2021-02-12 (×17): qty 1

## 2021-02-12 NOTE — Plan of Care (Signed)

## 2021-02-12 NOTE — Progress Notes (Signed)
[facility] Progress Note   70 year old gentleman who presented 12/28/2020 with a traumatic brain injury subarachnoid hemorrhage subdural hemorrhage temporal bone fracture extending into the middle area occipital bone fracture and right tympanic membrane rupture.  Hospital course was complicated by pulmonary embolus 01/09/2021.  He had acute kidney injury and was started on CVVHD 01/18/2021.  He was transitioned to acute intermittent hemodialysis.  He does not appear to have recovery of renal function.  His last dialysis was 02/08/2021 with 2.5 L removed.  Continue to monitor for recovery.  Assessment/ Plan:    1) Acute kidney injury, oliguric: Multifactorial etiology including ischemic ATN in the setting of hypotension, sepsis complicated by contrast injury. CRRT from 9/1-9/9.  No heparin as he is on bivalirudin.  The HD catheter was changed on 9/7.  CRRT restarted on 9/11 given elevated BUN and with more confusion, did not tolerated IHD on 9/10 (confusion, hypotension). CRRT clotted off on 9/13.   - last HD 02/08/2021 (Fri)    - renal function improving and will not need further dialysis; OK to remove the tunneled catheter unless access is needed during the hospitalization.  Signing off at this time; please reconsult as needed.   2) Fall/bilateral subarachnoid hemorrhage/SDH, TBI/occipital and temporal bone fracture: Per trauma team.  Repeat CT scan with no acute finding.   3) Acute respiratory failure: Status post trach on 8/29, trach collar   4) A. fib with RVR: On amiodarone, Eliquis.   5)  Anemia of critical illness: Transfuse as needed.   6) Metabolic acidosis: Managed with dialysis. resolved   7) Bilateral pulm embolism: Currently on anticoagulation.   8) Acute febrile illness: Per primary team.    Subjective:   Denies f/c/n/v/ dyspnea ; spouse bedside.    Objective:   BP (!) 122/49   Pulse 79   Temp 98.7 F (37.1 C) (Oral)   Resp 18   Ht 6\' 2"  (1.88 m)   Wt  109.5 kg   SpO2 94%   BMI 30.99 kg/m   Intake/Output Summary (Last 24 hours) at 02/12/2021 1313 Last data filed at 02/11/2021 1819 Gross per 24 hour  Intake 1207 ml  Output --  Net 1207 ml   Weight change: 4 kg  Physical Exam: General: nad, tracheostomy Heart: rrr Lungs: trach, cta bl Abdomen:soft, nontender. Extremities: trace dependent edema. Neurology:Alert awake  Dialysis Access: right  IJ  tunneled  HD catheter   Imaging: No results found.  Labs: BMET Recent Labs  Lab 02/06/21 0427 02/06/21 1720 02/07/21 0430 02/08/21 0410 02/09/21 0155 02/10/21 0500 02/11/21 0452 02/12/21 0500  NA 132* 134* 135 132* 130* 132* 133* 133*  K 3.1* 3.8 3.7 3.2* 4.0 3.4* 3.3* 3.7  CL 96* 97* 96* 95* 96* 95* 98 101  CO2 22 26 26 26 23 25 25 25   GLUCOSE 153* 128* 121* 121* 160* 148* 177* 127*  BUN 108* 48* 66* 89* 56* 76* 78* 69*  CREATININE 2.44* 1.50* 1.94* 2.00* 1.73* 1.77* 1.62* 1.35*  CALCIUM 8.9 8.7* 9.1 8.9 8.8* 9.1 9.1 9.1  PHOS 5.2* 2.6 3.8 4.2  --  3.7 3.9 3.0   CBC Recent Labs  Lab 02/07/21 0430 02/08/21 0410 02/09/21 0155 02/12/21 0500  WBC 7.2 5.9 7.9 5.1  HGB 7.5* 7.3* 8.2* 7.6*  HCT 24.2* 23.2* 26.6* 24.1*  MCV 84.6 84.7 85.5 86.7  PLT 323 348 386 328    Medications:     sodium chloride   Intravenous Once   acetaminophen  1,000 mg Per Tube Q6H   amiodarone  200 mg Oral BID   Followed by   Derrill Memo ON 02/15/2021] amiodarone  200 mg Oral Daily   apixaban  5 mg Per Tube BID   chlorhexidine  15 mL Mouth Rinse BID   Chlorhexidine Gluconate Cloth  6 each Topical Q0600   Chlorhexidine Gluconate Cloth  6 each Topical Q0600   docusate  100 mg Per Tube BID   feeding supplement (NEPRO CARB STEADY)  1,000 mL Per Tube Q24H   feeding supplement (NEPRO CARB STEADY)  237 mL Oral TID BM   feeding supplement (PROSource TF)  45 mL Per Tube BID   guaiFENesin  15 mL Per Tube Q4H   insulin aspart  0-20 Units Subcutaneous Q4H   insulin glargine-yfgn  60 Units  Subcutaneous BID   mouth rinse  15 mL Mouth Rinse q12n4p   methocarbamol  1,000 mg Per Tube Q8H   metoprolol tartrate  25 mg Per Tube BID   pantoprazole sodium  40 mg Per Tube Daily   polyethylene glycol  17 g Per Tube Daily   QUEtiapine  25 mg Per Tube QHS   senna  1 tablet Per Tube Daily   sodium chloride flush  10-40 mL Intracatheter Q12H      Otelia Santee, MD 02/12/2021, 1:13 PM

## 2021-02-12 NOTE — Progress Notes (Signed)
Patient ID: Angel [name], male   DOB: 1950/06/01, 70 y.o.   MRN: [id number]  [facility] Surgery Progress Note  29 Days Post-Op  Subjective: CC-  Wife at bedside. Patient sitting up in bed drinking Coke and eating eggs. Much more alert and talkative compared to yesterday. He does not endorse any complaints.  Objective: Vital signs in last 24 hours: Temp:  [98 F (36.7 C)-99.2 F (37.3 C)] 98.9 F (37.2 C) (09/27 0505) Pulse Rate:  [69-81] 76 (09/27 0505) Resp:  [11-18] 17 (09/27 0505) BP: (82-115)/(49-72) 108/55 (09/27 0505) SpO2:  [94 %-97 %] 97 % (09/27 0505) FiO2 (%):  [21 %] 21 % (09/27 0330) Weight:  [109.5 kg] 109.5 kg (09/27 0500) Last BM Date: 02/10/21  Intake/Output from previous day: 09/26 0701 - 09/27 0700 In: 1327 [P.O.:1117; NG/GT:210] Out: -  Intake/Output this shift: No intake/output data recorded.  PE: Gen:  Alert, NAD HEENT: EOM's intact, pupils equal and round. Cortrak in nare. Trach collar, PMV on Card:  RRR, no M/G/R heard, palpable pedal pulses Pulm:  mild rhonchi bilaterally, no wheezing, rate and effort normal Abd: Soft, protuberant, nontender, +BS Ext:  calves soft and nontender Neuro: MAEs, follows commands Skin: no rashes noted, warm and dry  Lab Results:  Recent Labs    02/12/21 0500  WBC 5.1  HGB 7.6*  HCT 24.1*  PLT 328   BMET Recent Labs    02/11/21 0452 02/12/21 0500  NA 133* 133*  K 3.3* 3.7  CL 98 101  CO2 25 25  GLUCOSE 177* 127*  BUN 78* 69*  CREATININE 1.62* 1.35*  CALCIUM 9.1 9.1   PT/INR No results for input(s): LABPROT, INR in the last 72 hours. CMP     Component Value Date/Time   NA 133 (L) 02/12/2021 0500   K 3.7 02/12/2021 0500   CL 101 02/12/2021 0500   CO2 25 02/12/2021 0500   GLUCOSE 127 (H) 02/12/2021 0500   BUN 69 (H) 02/12/2021 0500   CREATININE 1.35 (H) 02/12/2021 0500   CALCIUM 9.1 02/12/2021 0500   PROT 5.7 (L) 01/10/2021 1346   ALBUMIN 2.2 (L) 02/12/2021 0500   AST 30 01/10/2021 1346    ALT 34 01/10/2021 1346   ALKPHOS 53 01/10/2021 1346   BILITOT 0.4 01/10/2021 1346   GFRNONAA 56 (L) 02/12/2021 0500   Lipase  No results found for: LIPASE     Studies/Results: No results found.  Anti-infectives: Anti-infectives (From admission, onward)    Start     Dose/Rate Route Frequency Ordered Stop   02/04/21 1548  ceFAZolin (ANCEF) IVPB 2g/100 mL premix  Status:  Discontinued        over 30 Minutes  Continuous PRN 02/04/21 1549 02/09/21 1305   02/04/21 1545  ceFAZolin (ANCEF) IVPB 1 g/50 mL premix        1 g 100 mL/hr over 30 Minutes Intravenous  Once 02/04/21 1458 02/04/21 1600   02/04/21 1543  ceFAZolin (ANCEF) 2-4 GM/100ML-% IVPB       Note to Pharmacy: Lytle [place]   : cabinet override      02/04/21 1543 02/04/21 1651   01/11/21 2330  ceFEPIme (MAXIPIME) 2 g in sodium chloride 0.9 % 100 mL IVPB  Status:  Discontinued        2 g 200 mL/hr over 30 Minutes Intravenous Every 24 hours 01/11/21 0711 01/16/21 0907   01/10/21 1645  ampicillin (OMNIPEN) 2 g in sodium chloride 0.9 % 100 mL IVPB  Status:  Discontinued        2 g 300 mL/hr over 20 Minutes Intravenous Every 8 hours 01/10/21 1549 01/16/21 0907   01/09/21 2200  ceFEPIme (MAXIPIME) 2 g in sodium chloride 0.9 % 100 mL IVPB  Status:  Discontinued        2 g 200 mL/hr over 30 Minutes Intravenous Every 12 hours 01/09/21 1458 01/11/21 0711   01/08/21 1515  metroNIDAZOLE (FLAGYL) IVPB 500 mg  Status:  Discontinued        500 mg 100 mL/hr over 60 Minutes Intravenous Every 8 hours 01/08/21 1428 01/10/21 1618   01/03/21 0600  vancomycin (VANCOREADY) IVPB 1250 mg/250 mL  Status:  Discontinued        1,250 mg 166.7 mL/hr over 90 Minutes Intravenous Every 12 hours 01/02/21 1717 01/03/21 0837   01/02/21 1800  vancomycin (VANCOREADY) IVPB 2000 mg/400 mL        2,000 mg 200 mL/hr over 120 Minutes Intravenous  Once 01/02/21 1712 01/02/21 2007   01/02/21 0900  ceFEPIme (MAXIPIME) 2 g in sodium chloride 0.9 % 100 mL IVPB   Status:  Discontinued        2 g 200 mL/hr over 30 Minutes Intravenous Every 8 hours 01/02/21 0849 01/09/21 1458        Assessment/Plan Fall down stairs 8/12   VDRF - guaifenisen, S/P trach 8/29 by Dr. Bobbye Morton. Has tolerated HTC well, passy muir trials.  Trach changed to cuffless #6 on 9/20. They do not make #4 shirley XLT proximal trach therefore we will keep #6 for now ID - off abx, no fevers TBI/SAH/SDH - NSGY c/s, Dr. Annette Stable. Significant frontal lobe injuries. Keppra x7d for sz ppx (completed) Occipital bone fx - NSGY c/s, Dr. Annette Stable Temporal bone fx extending into middle ear - ENT c/s, Dr. Constance Holster, no acute treatment, will need re-eval hearing and facial nerve  Right TM Rupture - ENT c/s, Dr. Constance Holster [facility] -  cardiology s/o 9/23 with recs: metoprolol 25mg  BID,  amiodarone 200 mg BID x1 week, then decrease to 200 mg daily. Qtc 446 from 463 - improving, monitor ABL anemia- 1u PRBC 9/19, Hb 7.6 (9/27) Bilateral pulmonary embolism - Eliquis AKI - Cr trending down. HD per Renal, IR placed tunneled HD cath 9/19. Last HD 9/23  Hx DM2 - resistant SSI, novolog q4, glargine 60u BID Hx HTN - PRN meds FEN - PO intake improving, decreased nocturnal TF rate on 9/26 and continue DYS2 diet VTE - SCDs, Eliquis Dispo - Continue therapies. Encourage PO intake. Insurance approved SNF/LTACH. Follow up renal recs regarding HD.   LOS: 46 days    [facility] 02/12/2021, 8:47 AM Please see Amion for pager number during day hours 7:00am-4:30pm

## 2021-02-12 NOTE — TOC Progression Note (Addendum)
Transition of Care [facility]) - Progression Note    Patient Details  Name: Angel [name] MRN: [id number] Date of Birth: 1951-03-25  Transition of Care [facility]) CM/SW Contact  Ella Bodo, RN Phone Number: 02/12/2021, 3:14 PM  Clinical Narrative:    Patient continues to make good progress; no longer needs hemodialysis, per nephrology.  Spoke with Anderson Malta in admissions at [facility]; she states she will submit for insurance authorization for LTAC, as HD no longer needed.  Wife updated and agreeable to insurance Buyer, retail. Will follow with updates as they are available.   Expected Discharge Plan: Long Term Acute Care (LTAC) Barriers to Discharge: Continued Medical Work up  Expected Discharge Plan and Services Expected Discharge Plan: [place] (LTAC)   Discharge Planning Services: CM Consult   Living arrangements for the past 2 months: Single Family Home                                       Social Determinants of Health (SDOH) Interventions    Readmission Risk Interventions No flowsheet data found.  Reinaldo Raddle, RN, BSN  Trauma/Neuro ICU Case Manager [phone]

## 2021-02-12 NOTE — Progress Notes (Signed)
Occupational Therapy Treatment Patient Details Name: Angel [name] MRN: [id number] DOB: [birth year] Today's Date: 02/12/2021   History of present illness Angel [name] is a 70 y.o. 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 CT showed R temporal and parietal SAH, SAH anterior frontal lobes  bilaterally. Also sustained right occipital skull fracture, temporal bone fx, right TM rupture, bilateral PE. Intubated 8/12, trach'd 8/29. CRRT 9/1- 9/9.  PMH: DM2, HTN   OT comments  Pt demonstrates rancho coma recovery V level and noted to continue to have BP decreased during session. Pt requires ace wraps and could benefit from ted hose. Pt up in chair with hoyer lift placed for RN staff to lift back. RN notified of need for box for chair alarm. Wife confirmed staying til 2pm today to visit. Recommendation SNf at this time.    Recommendations for follow up therapy are one component of a multi-disciplinary discharge planning process, led by the attending physician.  Recommendations may be updated based on patient status, additional functional criteria and insurance authorization.    Follow Up Recommendations  SNF    Equipment Recommendations  3 in 1 bedside commode;Wheelchair (measurements OT);Wheelchair cushion (measurements OT);Hospital bed    Recommendations for Other Services Rehab consult    Precautions / Restrictions Precautions Precautions: Fall Precaution Comments: trach collar, cortrak, watch BP HR       Mobility Bed Mobility Overal bed mobility: Needs Assistance Bed Mobility: Rolling;Supine to Sit Rolling: +2 for physical assistance;Mod assist   Supine to sit: +2 for physical assistance;Max assist;HOB elevated     General bed mobility comments: pt initiates rolling toward the R side reaching with L UE. pt requires (A) to initiate toward the L side. pt requires (A) to elevate trunk from surface and bring bil LE off the bed surface. pt static sitting with UB initiated to  attempt trunk stabilization. pt with R lean. pt unable to sustain static sitting    Transfers Overall transfer level: Needs assistance              Lateral/Scoot Transfers: +2 physical assistance;Max assist;With slide board General transfer comment: pt transfering toward the R side with sliding board. pt anterior weight shifting otherwise dependent on therapists. pt    Balance Overall balance assessment: Needs assistance Sitting-balance support: Bilateral upper extremity supported;Feet supported Sitting balance-Leahy Scale: Poor                                     ADL either performed or assessed with clinical judgement   ADL Overall ADL's : Needs assistance/impaired   Eating/Feeding Details (indicate cue type and reason): require PMV per SLP recommendations. pt requesting fluids and RN made aware Grooming: Wash/dry face;Total assistance;Sitting Grooming Details (indicate cue type and reason): pt making comment "oh no you used that on my butt" pt needed education the wash cloth was clean and not used during peri care. Upper Body Bathing: Maximal assistance           Lower Body Dressing: Total assistance     Toilet Transfer Details (indicate cue type and reason): incontinence with brief at this time           General ADL Comments: transfer from bed to chair with sliding board this session. Total +2 max (A)     Vision       Perception     Praxis  Cognition Arousal/Alertness: Awake/alert Behavior During Therapy: Flat affect Overall Cognitive Status: Impaired/Different from baseline Area of Impairment: Orientation;Attention;Memory;Following commands;Awareness;Safety/judgement;Problem solving;Rancho level               Rancho Levels of Cognitive Functioning Ranch[place] [employer] Scales of Cognitive Functioning: Confused/appropriate Orientation Level: Disoriented to;Place;Time;Situation Current Attention Level: Focused Memory: Decreased  recall of precautions;Decreased short-term memory Following Commands: Follows one step commands inconsistently   Awareness: Intellectual Problem Solving: Slow processing;Decreased initiation;Difficulty sequencing General Comments: pt reports that he is in various states "[place]" "[place]" Pt unaware of hospitalization. pt does report correctly need for brief change when asked. pt recognizes wife        Exercises     Shoulder Instructions       General Comments ace wrapped applied prior to session. pt with leg pumps at EOB to help with BP stabilization. pt BP stable at 104/61 HR 69 in the chair at end of session    Pertinent Vitals/ Pain       Pain Assessment: Faces Faces Pain Scale: Hurts little more Pain Location: peri care Pain Descriptors / Indicators: Discomfort;Grimacing;Guarding Pain Intervention(s): Monitored during session;Repositioned;Other (comment) (barrier cream applied)  Home Living                                          Prior Functioning/Environment              Frequency  Min 2X/week        Progress Toward Goals  OT Goals(current goals can now be found in the care plan section)  Progress towards OT goals: Progressing toward goals  Acute Rehab OT Goals Patient Stated Goal: to drink OT Goal Formulation: With patient/family Time For Goal Achievement: 02/26/21 Potential to Achieve Goals: Good ADL Goals Additional ADL Goal #1: pt will follow 2 step comamnds 50% of session Additional ADL Goal #2: pt will visually locate 2 adl items 50% of request Additional ADL Goal #3: pt will static sit eob mod (A) for 10 minutes with stable VSS  Plan Discharge plan remains appropriate    Co-evaluation    PT/OT/SLP Co-Evaluation/Treatment: Yes Reason for Co-Treatment: Complexity of the patient's impairments (multi-system involvement);Necessary to address cognition/behavior during functional activity;For patient/therapist safety;To address  functional/ADL transfers   OT goals addressed during session: Strengthening/ROM;Proper use of Adaptive equipment and DME;ADL's and self-care      AM-PAC OT "6 Clicks" Daily Activity     Outcome Measure   Help from another person eating meals?: A Lot Help from another person taking care of personal grooming?: A Lot Help from another person toileting, which includes using toliet, bedpan, or urinal?: A Lot Help from another person bathing (including washing, rinsing, drying)?: Total Help from another person to put on and taking off regular upper body clothing?: A Lot Help from another person to put on and taking off regular lower body clothing?: Total 6 Click Score: 10    End of Session Equipment Utilized During Treatment: Oxygen  OT Visit Diagnosis: Unsteadiness on feet (R26.81);Other abnormalities of gait and mobility (R26.89);Muscle weakness (generalized) (M62.81);Other symptoms and signs involving cognitive function;Pain   Activity Tolerance Patient tolerated treatment well   Patient Left in chair;with call bell/phone within reach;with family/visitor present (chair alarm set up without box present RN made aware with family in room)   Nurse Communication Mobility status;Precautions;Need for lift equipment  Time: 0920-1002 OT Time Calculation (min): 42 min  Charges: OT General Charges $OT Visit: 1 Visit OT Treatments $Therapeutic Activity: 23-37 mins   Brynn, OTR/L  Acute Rehabilitation Services Pager: [phone] Office: [phone] .   Jeri Modena 02/12/2021, 10:20 AM

## 2021-02-12 NOTE — Progress Notes (Signed)
Physical Therapy Treatment Patient Details Name: Angel [name] MRN: [id number] DOB: July 23, 1950 Today's Date: 02/12/2021   History of Present Illness Angel [name] is a 70 y.o. 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 CT showed R temporal and parietal SAH, SAH anterior frontal lobes  bilaterally. Also sustained right occipital skull fracture, temporal bone fx, right TM rupture, bilateral PE. Intubated 8/12, trach'd 8/29. CRRT 9/1- 9/9.  PMH: DM2, HTN    PT Comments    Pt continues to have lethargy limiting active participation. Pt kept falling asleep during mid task requiring tactile cues to wake up. Pt with drop in BP into 80s/50s, bilat LEs ace wrapped however didn't assist. PT/OT used slide board to transfer to recliner, once LEs elevated pt's BP increased up to 104/61. RN notified. Aware pt's hemoglobin is 7.6, left message with trauma PA, Brooke regarding pt constant daytime lethargy and soft BP with mobility. Acute PT to cont to follow.    Recommendations for follow up therapy are one component of a multi-disciplinary discharge planning process, led by the attending physician.  Recommendations may be updated based on patient status, additional functional criteria and insurance authorization.  Follow Up Recommendations  SNF     Equipment Recommendations  None recommended by PT (TBD at next venue)    Recommendations for Other Services       Precautions / Restrictions Precautions Precautions: Fall Precaution Comments: trach collar, cortrak, watch BP HR Required Braces or Orthoses:  (B Prevalon) Restrictions Weight Bearing Restrictions: No     Mobility  Bed Mobility Overal bed mobility: Needs Assistance Bed Mobility: Rolling;Supine to Sit Rolling: +2 for physical assistance;Mod assist Sidelying to sit: Max assist;HOB elevated Supine to sit: +2 for physical assistance;Max assist;HOB elevated Sit to supine: Max assist;+2 for physical assistance   General bed mobility  comments: pt initiates rolling toward the R side reaching with L UE. pt requires (A) to initiate toward the L side. pt requires (A) to elevate trunk from surface and bring bil LE off the bed surface. pt static sitting with UB initiated to attempt trunk stabilization. pt with R lean. pt unable to sustain static sitting    Transfers Overall transfer level: Needs assistance   Transfers: Lateral/Scoot Transfers          Lateral/Scoot Transfers: Total assist;+2 physical assistance;With slide board General transfer comment: pt transfering toward the R side with sliding board. pt anterior weight shifting otherwise dependent on therapists with 3rd person holding the chair, pt with no active participation in transfer  Ambulation/Gait             General Gait Details: unable at this time   Stairs             Wheelchair Mobility    Modified Rankin (Stroke Patients Only) Modified Rankin (Stroke Patients Only) Pre-Morbid Rankin Score: No symptoms Modified Rankin: Severe disability     Balance Overall balance assessment: Needs assistance Sitting-balance support: Bilateral upper extremity supported;Feet supported Sitting balance-Leahy Scale: Poor Sitting balance - Comments: pt kept faling asleep in sitting and falling forward requiring assist to stay upright, pt also with R lateral lean. pt requiring modA to maintain EOB balance on R side, pt with about a minute of tolerating sitting EOB without physical assist and just R UE holding onto foot of bed Postural control: Right lateral lean  Cognition Arousal/Alertness: Awake/alert Behavior During Therapy: Flat affect Overall Cognitive Status: Impaired/Different from baseline Area of Impairment: Orientation;Attention;Memory;Following commands;Awareness;Safety/judgement;Problem solving;Rancho level               Rancho Levels of Cognitive Functioning [place] [employer] Scales of  Cognitive Functioning: Confused/appropriate Orientation Level: Disoriented to;Place;Time;Situation (stated he was in [place] and asked when they moved to [place], wife states they've never lived anywhere by [employer]) Current Attention Level: Focused Memory: Decreased recall of precautions;Decreased short-term memory Following Commands: Follows one step commands inconsistently Safety/Judgement: Decreased awareness of safety;Decreased awareness of deficits Awareness: Intellectual Problem Solving: Slow processing;Decreased initiation;Difficulty sequencing General Comments: pt continues to fall asleep frequently during session requiring tactile cues to wake up      Exercises      General Comments General comments (skin integrity, edema, etc.): ace wrapped bilat LE in effort to minimize drop in BP with mobility      Pertinent Vitals/Pain Pain Assessment: Faces Faces Pain Scale: Hurts little more Pain Location: peri care Pain Descriptors / Indicators: Discomfort;Grimacing;Guarding Pain Intervention(s): Monitored during session    Home Living                      Prior Function            PT Goals (current goals can now be found in the care plan section) Acute Rehab PT Goals Patient Stated Goal: to drink PT Goal Formulation: Patient unable to participate in goal setting Time For Goal Achievement: 02/21/21 Potential to Achieve Goals: Fair Progress towards PT goals: Not progressing toward goals - comment    Frequency    Min 3X/week      PT Plan Current plan remains appropriate    Co-evaluation PT/OT/SLP Co-Evaluation/Treatment: Yes Reason for Co-Treatment: For patient/therapist safety;To address functional/ADL transfers PT goals addressed during session: Mobility/safety with mobility        AM-PAC PT "6 Clicks" Mobility   Outcome Measure  Help needed turning from your back to your side while in a flat bed without using bedrails?: Total Help needed moving from  lying on your back to sitting on the side of a flat bed without using bedrails?: Total Help needed moving to and from a bed to a chair (including a wheelchair)?: Total Help needed standing up from a chair using your arms (e.g., wheelchair or bedside chair)?: Total Help needed to walk in hospital room?: Total Help needed climbing 3-5 steps with a railing? : Total 6 Click Score: 6    End of Session Equipment Utilized During Treatment: Oxygen Activity Tolerance: Patient limited by lethargy Patient left: with call bell/phone within reach;in chair;with chair alarm set;with family/visitor present Nurse Communication: Mobility status;Need for lift equipment (use maximove to transfer pt back to bed) PT Visit Diagnosis: Unsteadiness on feet (R26.81);Muscle weakness (generalized) (M62.81);Difficulty in walking, not elsewhere classified (R26.2)     Time: 4431-5400 PT Time Calculation (min) (ACUTE ONLY): 33 min  Charges:  $Therapeutic Activity: 8-22 mins                     Kittie Plater, PT, DPT Acute Rehabilitation Services Pager #: [phone] Office #: [phone]    Berline Lopes 02/12/2021, 2:10 PM

## 2021-02-13 DIAGNOSIS — J9601 Acute respiratory failure with hypoxia: Secondary | ICD-10-CM | POA: Diagnosis not present

## 2021-02-13 DIAGNOSIS — I2699 Other pulmonary embolism without acute cor pulmonale: Secondary | ICD-10-CM | POA: Diagnosis not present

## 2021-02-13 DIAGNOSIS — Z20822 Contact with and (suspected) exposure to covid-19: Secondary | ICD-10-CM | POA: Diagnosis not present

## 2021-02-13 DIAGNOSIS — S066X9A Traumatic subarachnoid hemorrhage with loss of consciousness of unspecified duration, initial encounter: Secondary | ICD-10-CM | POA: Diagnosis not present

## 2021-02-13 LAB — RENAL FUNCTION PANEL
Albumin: 2.2 g/dL — ABNORMAL LOW (ref 3.5–5.0)
Anion gap: 10 (ref 5–15)
BUN: 55 mg/dL — ABNORMAL HIGH (ref 8–23)
CO2: 23 mmol/L (ref 22–32)
Calcium: 9 mg/dL (ref 8.9–10.3)
Chloride: 100 mmol/L (ref 98–111)
Creatinine, Ser: 1.29 mg/dL — ABNORMAL HIGH (ref 0.61–1.24)
GFR, Estimated: 60 mL/min — ABNORMAL LOW (ref >60)
Glucose, Bld: 103 mg/dL — ABNORMAL HIGH (ref 70–99)
Phosphorus: 3.3 mg/dL (ref 2.5–4.6)
Potassium: 3.2 mmol/L — ABNORMAL LOW (ref 3.5–5.1)
Sodium: 133 mmol/L — ABNORMAL LOW (ref 135–145)

## 2021-02-13 LAB — GLUCOSE, CAPILLARY
Glucose-Capillary: 100 mg/dL — ABNORMAL HIGH (ref 70–99)
Glucose-Capillary: 113 mg/dL — ABNORMAL HIGH (ref 70–99)
Glucose-Capillary: 126 mg/dL — ABNORMAL HIGH (ref 70–99)
Glucose-Capillary: 144 mg/dL — ABNORMAL HIGH (ref 70–99)
Glucose-Capillary: 54 mg/dL — ABNORMAL LOW (ref 70–99)
Glucose-Capillary: 72 mg/dL (ref 70–99)
Glucose-Capillary: 78 mg/dL (ref 70–99)
Glucose-Capillary: 80 mg/dL (ref 70–99)

## 2021-02-13 LAB — IRON AND TIBC
Iron: 41 ug/dL — ABNORMAL LOW (ref 45–182)
Saturation Ratios: 12 % — ABNORMAL LOW (ref 17.9–39.5)
TIBC: 329 ug/dL (ref 250–450)
UIBC: 288 ug/dL

## 2021-02-13 LAB — FERRITIN: Ferritin: 67 ng/mL (ref 24–336)

## 2021-02-13 LAB — CBC
HCT: 24.7 % — ABNORMAL LOW (ref 39.0–52.0)
Hemoglobin: 7.4 g/dL — ABNORMAL LOW (ref 13.0–17.0)
MCH: 26.4 pg (ref 26.0–34.0)
MCHC: 30 g/dL (ref 30.0–36.0)
MCV: 88.2 fL (ref 80.0–100.0)
Platelets: 343 10*3/uL (ref 150–400)
RBC: 2.8 MIL/uL — ABNORMAL LOW (ref 4.22–5.81)
RDW: 17.8 % — ABNORMAL HIGH (ref 11.5–15.5)
WBC: 5.8 10*3/uL (ref 4.0–10.5)
nRBC: 0 % (ref 0.0–0.2)

## 2021-02-13 LAB — MAGNESIUM: Magnesium: 2 mg/dL (ref 1.7–2.4)

## 2021-02-13 MED ORDER — SENNA 8.6 MG PO TABS
1.0000 | ORAL_TABLET | Freq: Every day | ORAL | Status: DC
  Administered 2021-02-14 – 2021-03-01 (×13): 8.6 mg via ORAL
  Filled 2021-02-13 (×14): qty 1

## 2021-02-13 MED ORDER — ASCORBIC ACID 500 MG PO TABS
500.0000 mg | ORAL_TABLET | Freq: Two times a day (BID) | ORAL | Status: DC
  Administered 2021-02-13 – 2021-03-01 (×33): 500 mg via ORAL
  Filled 2021-02-13 (×33): qty 1

## 2021-02-13 MED ORDER — INSULIN GLARGINE-YFGN 100 UNIT/ML ~~LOC~~ SOLN
16.0000 [IU] | Freq: Two times a day (BID) | SUBCUTANEOUS | Status: DC
  Administered 2021-02-14 – 2021-03-01 (×30): 16 [IU] via SUBCUTANEOUS
  Filled 2021-02-13 (×34): qty 0.16

## 2021-02-13 MED ORDER — POLYETHYLENE GLYCOL 3350 17 G PO PACK
17.0000 g | PACK | Freq: Every day | ORAL | Status: DC
  Administered 2021-02-14 – 2021-03-01 (×9): 17 g via ORAL
  Filled 2021-02-13 (×12): qty 1

## 2021-02-13 MED ORDER — FERROUS SULFATE 325 (65 FE) MG PO TABS
325.0000 mg | ORAL_TABLET | Freq: Two times a day (BID) | ORAL | Status: DC
  Administered 2021-02-13 – 2021-03-01 (×33): 325 mg via ORAL
  Filled 2021-02-13 (×33): qty 1

## 2021-02-13 MED ORDER — POTASSIUM CHLORIDE 20 MEQ PO PACK
40.0000 meq | PACK | Freq: Two times a day (BID) | ORAL | Status: AC
  Administered 2021-02-13 (×2): 40 meq via ORAL
  Filled 2021-02-13 (×2): qty 2

## 2021-02-13 NOTE — Progress Notes (Signed)
Nutrition Follow-up  DOCUMENTATION CODES:   Obesity unspecified  INTERVENTION:   - Continue Nepro Shake po TID, each supplement provides 425 kcal and 19 grams protein  NUTRITION DIAGNOSIS:   Inadequate oral intake related to inability to eat as evidenced by NPO status.  Progressing, pt now on Carb Modified diet  GOAL:   Patient will meet greater than or equal to 90% of their needs  Progressing  MONITOR:   TF tolerance  REASON FOR ASSESSMENT:   Consult, Ventilator Enteral/tube feeding initiation and management  ASSESSMENT:   Pt with PMH of DM and HTN admitted after falling down basement stairs with TBI/SAH/SDH, significant frontal lobe injuries, occipital bone fx, temporal bone fx extending into middle ear, and R TM rupture.  8/15 - s/p Cortrak placement (tip gastric) 8/25 - pt vomited, OG placed with 650 ml out, abd tight  8/26 - extubated but required re-intubation 2 hours later, Cortrak coiled in pt's mouth post extubation and removed, NG tube placed with tip in distal stomach  8/27 - TF resumed 8/29 - s/p trach placement  8/31 - Cortrak placed (tip gastric) 9/01 - CRRT started 9/13 - CRRT stopped 9/14 - first iHD 9/19 - tunneled catheter placed for long-term iHD 9/21 - FEES, diet advanced to dysphagia 2 with thin liquid 9/22 - transitioned to nocturnal TF 9/27 - nocturnal TF d/c, Cortrak removed 9/28 - diet advanced to Carb Modified  Per notes, creatinine continues to improve and Nephrology reports no further need for dialysis. Per RT note, pt did not tolerating trach capping today.  Spoke with pt's wife at bedside. Pt working with RT at time of RD visit. Noted pt had consumed ~75% of breakfast meal tray. Pt's wife reports that pt's appetite is improving. Pt was advanced to regular textures today and wife believes that this will help improve his PO intake further. He is drinking the Nepro shakes and enjoys these. RD will continue with current oral nutrition  supplement order.  Pt is pending d/c to CIR.  Admit weight: 133.8 kg Current weight: 107 kg  Meal Completion: 25-75%  Medications reviewed and include: vitamin C 500 mg BID, colace, Nepro Shake TID, ferrous sulfate, SSI q 4 hours, semglee 60 units BID, protonix, miralax, klor-con 40 mEq x 2 doses, senna  Labs reviewed: sodium 133, potassium 3.2, hemoglobin 7.4 CBG's: 54-185 x 24 hours  Diet Order:   Diet Order             Diet Carb Modified Fluid consistency: Thin; Room service appropriate? Yes  Diet effective now                   EDUCATION NEEDS:   Not appropriate for education at this time  Skin:  Skin Assessment: Reviewed RN Assessment  Last BM:  02/12/21  Height:   Ht Readings from Last 1 Encounters:  01/26/21 6\' 2"  (1.88 m)    Weight:   Wt Readings from Last 1 Encounters:  02/13/21 107 kg    BMI:  Body mass index is 30.29 kg/m.  Estimated Nutritional Needs:   Kcal:  2500-2700  Protein:  130-150 grams  Fluid:  > 2 L/day    Gustavus Bryant, MS, RD, LDN Inpatient Clinical Dietitian Please see AMiON for contact information.

## 2021-02-13 NOTE — Plan of Care (Signed)

## 2021-02-13 NOTE — Progress Notes (Signed)
Hypoglycemic Event  CBG: 54  Treatment: 4 oz juice/soda  Symptoms: None  Follow-up CBG: Time:0410 CBG Result:78  Possible Reasons for Event: Unknown     Angel [name]

## 2021-02-13 NOTE — Progress Notes (Signed)
Speech Language Pathology Treatment: Dysphagia;Cognitive-Linquistic  Patient Details Name: Angel [name] MRN: [id number] DOB: 1950-10-29 Today's Date: 02/13/2021 Time: 4098-1191 SLP Time Calculation (min) (ACUTE ONLY): 31 min  Assessment / Plan / Recommendation Clinical Impression  Pt was seen for swallowing/cognitive/PMV therapy targeting upgraded PO trials and orientation, awareness, and executive functioning. SLP donned speaking valve and noted no changes in sats (HR 62, spO2 95, RR 13-20) and observed pt with Dys2/thin liquids and trialed regular solids. Across consistencies, pt exhibited no oral phase impairments and no overt s/s of penetration or aspiration. Pt alertness greatly improved since last session and confusion diminishing (oriented to person, place, and situation). Memory improving; pt able to recall details of accident this date. SLP prompted pt to complete verbal sequencing task; pt completed task with one error, not self-corrected despite two verbal cues. Speech intelligibility with valve 100% at all utterance levels.  Recommend wearing PMV during all waking hours (remove for sleep) d/t pt increased tolerance and strength. Pt wife at bedside educated on donning and doffing speaking valve and demonstrated understanding and accuracy placing valve. Recommend regular/thin diet with medications administered whole in puree. SLP will continue to follow for therapy targeting diet tolerance, problem solving, and executive functioning.    HPI HPI: Angel [name] is a 70 y.o. 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 CT showed R temporal and parietal SAH, SAH anterior frontal lobes  bilaterally. Also sustained right occipital skull fracture, temporal bone fx, right TM rupture, bilateral PE. Intubated 8/12, trach'd 8/29. PMH: DM2, HTN      SLP Plan  Continue with current plan of care      Recommendations for follow up therapy are one component of a multi-disciplinary discharge  planning process, led by the attending physician.  Recommendations may be updated based on patient status, additional functional criteria and insurance authorization.    Recommendations  Diet recommendations: Regular;Thin liquid Liquids provided via: Cup;Straw Medication Administration: Whole meds with puree Supervision: Intermittent supervision to cue for compensatory strategies Compensations: Minimize environmental distractions;Clear throat intermittently Postural Changes and/or Swallow Maneuvers: Seated upright 90 degrees      Patient may use Passy-Muir Speech Valve: During all waking hours (remove during sleep) PMSV Supervision: Intermittent         General recommendations: Rehab consult Oral Care Recommendations: Oral care BID Follow up Recommendations: Inpatient Rehab SLP Visit Diagnosis: Dysphagia, unspecified (R13.10);Cognitive communication deficit (Y78.295) Plan: Continue with current plan of care       GO              Dewitt Rota, SLP-Student   Dewitt Rota  02/13/2021, 12:37 PM

## 2021-02-13 NOTE — Progress Notes (Signed)
Inpatient Diabetes Program Recommendations  AACE/ADA: New Consensus Statement on Inpatient Glycemic Control   Target Ranges:  Prepandial:   less than 140 mg/dL      Peak postprandial:   less than 180 mg/dL (1-2 hours)      Critically ill patients:  140 - 180 mg/dL   Results for Angel [name], Angel [name] (MRN [id number]) as of 02/13/2021 10:05  Ref. Range 02/12/2021 07:55 02/12/2021 11:22 02/12/2021 15:39 02/12/2021 20:13 02/12/2021 23:43 02/13/2021 03:47 02/13/2021 04:10 02/13/2021 08:19  Glucose-Capillary Latest Ref Range: 70 - 99 mg/dL 73 125 (H) 185 (H) 97 126 (H) 54 (L) 78 72   Review of Glycemic Control  Diabetes history: DM2 Outpatient Diabetes medications: Glipizide 2.5 mg daily, Tradjenta 5 mg daily, Metformin 1000 mg BID, Actos 45 mg QHS Current orders for Inpatient glycemic control: Semglee 60 units BID, Novolog 0-20 units Q4H  Inpatient Diabetes Program Recommendations:    Insulin: Please consider decreasing Semglee 16 units BID (based on 107 kg x 0.3 units).  Thanks, Barnie Alderman, RN, MSN, CDE Diabetes Coordinator Inpatient Diabetes Program [phone] (Team Pager from 8am to 5pm)

## 2021-02-13 NOTE — Progress Notes (Signed)
30 Days Post-Op  Subjective: CC: Doing well. No areas of pain this am. Cortrak out. Tolerating dys diet without n/v. Tolerating pmv. Reports he is voiding and had a bm yesterday.   Objective: Vital signs in last 24 hours: Temp:  [98.6 F (37 C)-98.9 F (37.2 C)] 98.7 F (37.1 C) (09/28 0755) Pulse Rate:  [64-82] 78 (09/28 0959) Resp:  [13-24] 15 (09/28 0850) BP: (104-127)/(49-67) 105/66 (09/28 0959) SpO2:  [94 %-100 %] 100 % (09/28 0850) FiO2 (%):  [21 %] 21 % (09/28 0850) Weight:  [448 kg] 107 kg (09/28 0547) Last BM Date: 02/12/21  Intake/Output from previous day: 09/27 0701 - 09/28 0700 In: 1774 [P.O.:1774] Out: -  Intake/Output this shift: No intake/output data recorded.  PE: Gen:  Alert, NAD HEENT: EOM's intact, pupils equal and round. Trach collar Card:  RRR, no M/G/R heard, palpable pedal pulses Pulm:  CTAB, no W/R/R, rate and effort normal Abd: Soft, protuberant, nontender, +BS Ext:  calves soft and nontender Neuro: follows commands Skin: no rashes noted, warm and dry  Lab Results:  Recent Labs    02/12/21 0500 02/13/21 0410  WBC 5.1 5.8  HGB 7.6* 7.4*  HCT 24.1* 24.7*  PLT 328 343   BMET Recent Labs    02/12/21 0500 02/13/21 0410  NA 133* 133*  K 3.7 3.2*  CL 101 100  CO2 25 23  GLUCOSE 127* 103*  BUN 69* 55*  CREATININE 1.35* 1.29*  CALCIUM 9.1 9.0   PT/INR No results for input(s): LABPROT, INR in the last 72 hours. CMP     Component Value Date/Time   NA 133 (L) 02/13/2021 0410   K 3.2 (L) 02/13/2021 0410   CL 100 02/13/2021 0410   CO2 23 02/13/2021 0410   GLUCOSE 103 (H) 02/13/2021 0410   BUN 55 (H) 02/13/2021 0410   CREATININE 1.29 (H) 02/13/2021 0410   CALCIUM 9.0 02/13/2021 0410   PROT 5.7 (L) 01/10/2021 1346   ALBUMIN 2.2 (L) 02/13/2021 0410   AST 30 01/10/2021 1346   ALT 34 01/10/2021 1346   ALKPHOS 53 01/10/2021 1346   BILITOT 0.4 01/10/2021 1346   GFRNONAA 60 (L) 02/13/2021 0410   Lipase  No results found for:  LIPASE  Studies/Results: No results found.  Anti-infectives: Anti-infectives (From admission, onward)    Start     Dose/Rate Route Frequency Ordered Stop   02/04/21 1548  ceFAZolin (ANCEF) IVPB 2g/100 mL premix  Status:  Discontinued        over 30 Minutes  Continuous PRN 02/04/21 1549 02/09/21 1305   02/04/21 1545  ceFAZolin (ANCEF) IVPB 1 g/50 mL premix        1 g 100 mL/hr over 30 Minutes Intravenous  Once 02/04/21 1458 02/04/21 1600   02/04/21 1543  ceFAZolin (ANCEF) 2-4 GM/100ML-% IVPB       Note to Pharmacy: Lytle Butte   : cabinet override      02/04/21 1543 02/04/21 1651   01/11/21 2330  ceFEPIme (MAXIPIME) 2 g in sodium chloride 0.9 % 100 mL IVPB  Status:  Discontinued        2 g 200 mL/hr over 30 Minutes Intravenous Every 24 hours 01/11/21 0711 01/16/21 0907   01/10/21 1645  ampicillin (OMNIPEN) 2 g in sodium chloride 0.9 % 100 mL IVPB  Status:  Discontinued        2 g 300 mL/hr over 20 Minutes Intravenous Every 8 hours 01/10/21 1549 01/16/21 0907   01/09/21 2200  ceFEPIme (MAXIPIME) 2 g in sodium chloride 0.9 % 100 mL IVPB  Status:  Discontinued        2 g 200 mL/hr over 30 Minutes Intravenous Every 12 hours 01/09/21 1458 01/11/21 0711   01/08/21 1515  metroNIDAZOLE (FLAGYL) IVPB 500 mg  Status:  Discontinued        500 mg 100 mL/hr over 60 Minutes Intravenous Every 8 hours 01/08/21 1428 01/10/21 1618   01/03/21 0600  vancomycin (VANCOREADY) IVPB 1250 mg/250 mL  Status:  Discontinued        1,250 mg 166.7 mL/hr over 90 Minutes Intravenous Every 12 hours 01/02/21 1717 01/03/21 0837   01/02/21 1800  vancomycin (VANCOREADY) IVPB 2000 mg/400 mL        2,000 mg 200 mL/hr over 120 Minutes Intravenous  Once 01/02/21 1712 01/02/21 2007   01/02/21 0900  ceFEPIme (MAXIPIME) 2 g in sodium chloride 0.9 % 100 mL IVPB  Status:  Discontinued        2 g 200 mL/hr over 30 Minutes Intravenous Every 8 hours 01/02/21 0849 01/09/21 1458        Assessment/Plan Fall down stairs  8/12 VDRF - guaifenisen, S/P trach 8/29 by Dr. Bobbye Morton. Has tolerated HTC well, passy muir trials. Trach changed to cuffless #6 on 9/20. There is no XLT #4 cuffless. Will try capping trial TBI/SAH/SDH - NSGY c/s, Dr. Annette Stable. Significant frontal lobe injuries. Keppra x7d for sz ppx (completed) Occipital bone fx - NSGY c/s, Dr. Annette Stable Temporal bone fx extending into middle ear - ENT c/s, Dr. Constance Holster, no acute treatment, will need re-eval hearing and facial nerve  Right TM Rupture - ENT c/s, Dr. Constance Holster [facility] -  cardiology s/o 9/23 with recs: metoprolol 25mg  BID,  amiodarone 200 mg BID x1 week, then decrease to 200 mg daily. ABL anemia- 1u PRBC 9/19, hgb stable at 7.4. Iron and vitamin c Bilateral pulmonary embolism - Eliquis AKI - Cr continues to improve. Nephrology reports no further need for dialysis. Okay to remove tunneled catheter. Will discuss with RN Hx DM2 - Hypoglycemia after d/c TF's. SSI. Decrease semglee to 16U BID Hx HTN - PRN meds FEN - DYS2 diet, replace K VTE - SCDs, Eliquis ID - off abx, no fevers Dispo - Cap trach. Continue therapies, DYS 2 diet, adjust insulin, QTc better, SNF/LTACH   LOS: 47 days    Jillyn Ledger , [facility] Surgery 02/13/2021, 10:06 AM Please see Amion for pager number during day hours 7:00am-4:30pm

## 2021-02-13 NOTE — Progress Notes (Signed)
RT attempted to cap the patient's trach per MD order.  Patient did not tolerate the cap. He felt like he could not get any air in.  RT removed the cap, replaced his PMV and the patient felt he had better inspiratory flow.  Family at bedside.  RN and NT notified.

## 2021-02-14 ENCOUNTER — Inpatient Hospital Stay (HOSPITAL_COMMUNITY): Payer: PPO

## 2021-02-14 DIAGNOSIS — I2699 Other pulmonary embolism without acute cor pulmonale: Secondary | ICD-10-CM | POA: Diagnosis not present

## 2021-02-14 DIAGNOSIS — J9601 Acute respiratory failure with hypoxia: Secondary | ICD-10-CM | POA: Diagnosis not present

## 2021-02-14 DIAGNOSIS — Z20822 Contact with and (suspected) exposure to covid-19: Secondary | ICD-10-CM | POA: Diagnosis not present

## 2021-02-14 DIAGNOSIS — S066X9A Traumatic subarachnoid hemorrhage with loss of consciousness of unspecified duration, initial encounter: Secondary | ICD-10-CM | POA: Diagnosis not present

## 2021-02-14 HISTORY — PX: IR REMOVAL TUN CV CATH W/O FL: IMG2289

## 2021-02-14 LAB — GLUCOSE, CAPILLARY
Glucose-Capillary: 105 mg/dL — ABNORMAL HIGH (ref 70–99)
Glucose-Capillary: 125 mg/dL — ABNORMAL HIGH (ref 70–99)
Glucose-Capillary: 127 mg/dL — ABNORMAL HIGH (ref 70–99)
Glucose-Capillary: 136 mg/dL — ABNORMAL HIGH (ref 70–99)
Glucose-Capillary: 165 mg/dL — ABNORMAL HIGH (ref 70–99)
Glucose-Capillary: 169 mg/dL — ABNORMAL HIGH (ref 70–99)
Glucose-Capillary: 217 mg/dL — ABNORMAL HIGH (ref 70–99)
Glucose-Capillary: 66 mg/dL — ABNORMAL LOW (ref 70–99)

## 2021-02-14 LAB — RENAL FUNCTION PANEL
Albumin: 2.2 g/dL — ABNORMAL LOW (ref 3.5–5.0)
Anion gap: 8 (ref 5–15)
BUN: 44 mg/dL — ABNORMAL HIGH (ref 8–23)
CO2: 23 mmol/L (ref 22–32)
Calcium: 9.1 mg/dL (ref 8.9–10.3)
Chloride: 102 mmol/L (ref 98–111)
Creatinine, Ser: 1.17 mg/dL (ref 0.61–1.24)
GFR, Estimated: >60 mL/min (ref >60)
Glucose, Bld: 87 mg/dL (ref 70–99)
Phosphorus: 3.3 mg/dL (ref 2.5–4.6)
Potassium: 4.2 mmol/L (ref 3.5–5.1)
Sodium: 133 mmol/L — ABNORMAL LOW (ref 135–145)

## 2021-02-14 LAB — MAGNESIUM: Magnesium: 2 mg/dL (ref 1.7–2.4)

## 2021-02-14 MED ORDER — INSULIN ASPART 100 UNIT/ML IJ SOLN
0.0000 [IU] | Freq: Three times a day (TID) | INTRAMUSCULAR | Status: DC
  Administered 2021-02-14: 2 [IU] via SUBCUTANEOUS
  Administered 2021-02-14: 3 [IU] via SUBCUTANEOUS
  Administered 2021-02-14: 5 [IU] via SUBCUTANEOUS
  Administered 2021-02-15 – 2021-02-16 (×4): 3 [IU] via SUBCUTANEOUS
  Administered 2021-02-16 – 2021-02-17 (×3): 2 [IU] via SUBCUTANEOUS
  Administered 2021-02-17: 5 [IU] via SUBCUTANEOUS
  Administered 2021-02-18 (×2): 2 [IU] via SUBCUTANEOUS
  Administered 2021-02-18: 3 [IU] via SUBCUTANEOUS
  Administered 2021-02-19: 2 [IU] via SUBCUTANEOUS
  Administered 2021-02-19: 3 [IU] via SUBCUTANEOUS
  Administered 2021-02-19: 2 [IU] via SUBCUTANEOUS
  Administered 2021-02-20 (×2): 3 [IU] via SUBCUTANEOUS
  Administered 2021-02-20: 2 [IU] via SUBCUTANEOUS
  Administered 2021-02-21: 3 [IU] via SUBCUTANEOUS
  Administered 2021-02-21 – 2021-02-22 (×3): 2 [IU] via SUBCUTANEOUS
  Administered 2021-02-22 (×2): 3 [IU] via SUBCUTANEOUS
  Administered 2021-02-23: 5 [IU] via SUBCUTANEOUS
  Administered 2021-02-23: 2 [IU] via SUBCUTANEOUS
  Administered 2021-02-23 – 2021-02-25 (×4): 3 [IU] via SUBCUTANEOUS
  Administered 2021-02-25 – 2021-02-26 (×2): 2 [IU] via SUBCUTANEOUS
  Administered 2021-02-26 (×2): 5 [IU] via SUBCUTANEOUS
  Administered 2021-02-26: 3 [IU] via SUBCUTANEOUS
  Administered 2021-02-27: 5 [IU] via SUBCUTANEOUS
  Administered 2021-02-27 (×2): 3 [IU] via SUBCUTANEOUS
  Administered 2021-02-28: 8 [IU] via SUBCUTANEOUS
  Administered 2021-02-28: 3 [IU] via SUBCUTANEOUS
  Administered 2021-02-28: 5 [IU] via SUBCUTANEOUS
  Administered 2021-03-01 (×2): 3 [IU] via SUBCUTANEOUS

## 2021-02-14 MED ORDER — LIDOCAINE HCL 1 % IJ SOLN
INTRAMUSCULAR | Status: AC
  Filled 2021-02-14: qty 20

## 2021-02-14 MED ORDER — PANTOPRAZOLE SODIUM 40 MG PO TBEC
40.0000 mg | DELAYED_RELEASE_TABLET | Freq: Every day | ORAL | Status: DC
  Administered 2021-02-14 – 2021-03-01 (×16): 40 mg via ORAL
  Filled 2021-02-14 (×16): qty 1

## 2021-02-14 NOTE — Plan of Care (Signed)
  Problem: Education: Goal: Knowledge of General Education information will improve Description: Including pain rating scale, medication(s)/side effects and non-pharmacologic comfort measures 02/14/2021 0838 by Carolynn Serve, RN Outcome: Progressing 02/14/2021 0837 by Carolynn Serve, RN Outcome: Progressing   Problem: Health Behavior/Discharge Planning: Goal: Ability to manage health-related needs will improve 02/14/2021 0838 by Carolynn Serve, RN Outcome: Progressing 02/14/2021 0837 by Carolynn Serve, RN Outcome: Progressing   Problem: Clinical Measurements: Goal: Ability to maintain clinical measurements within normal limits will improve 02/14/2021 0838 by Carolynn Serve, RN Outcome: Progressing 02/14/2021 0837 by Carolynn Serve, RN Outcome: Progressing Goal: Will remain free from infection 02/14/2021 0838 by Carolynn Serve, RN Outcome: Progressing 02/14/2021 0837 by Carolynn Serve, RN Outcome: Progressing Goal: Diagnostic test results will improve 02/14/2021 0838 by Carolynn Serve, RN Outcome: Progressing 02/14/2021 0837 by Carolynn Serve, RN Outcome: Progressing Goal: Respiratory complications will improve 02/14/2021 0838 by Carolynn Serve, RN Outcome: Progressing 02/14/2021 0837 by Carolynn Serve, RN Outcome: Progressing Goal: Cardiovascular complication will be avoided 02/14/2021 0838 by Carolynn Serve, RN Outcome: Progressing 02/14/2021 0837 by Carolynn Serve, RN Outcome: Progressing   Problem: Activity: Goal: Risk for activity intolerance will decrease 02/14/2021 0838 by Carolynn Serve, RN Outcome: Progressing 02/14/2021 0837 by Carolynn Serve, RN Outcome: Progressing   Problem: Nutrition: Goal: Adequate nutrition will be maintained 02/14/2021 0838 by Carolynn Serve, RN Outcome: Progressing 02/14/2021 0837 by Carolynn Serve, RN Outcome: Progressing   Problem: Coping: Goal: Level of anxiety will decrease 02/14/2021 0838 by Carolynn Serve, RN Outcome:  Progressing 02/14/2021 0837 by Carolynn Serve, RN Outcome: Progressing   Problem: Elimination: Goal: Will not experience complications related to bowel motility 02/14/2021 0838 by Carolynn Serve, RN Outcome: Progressing 02/14/2021 0837 by Carolynn Serve, RN Outcome: Progressing Goal: Will not experience complications related to urinary retention 02/14/2021 0838 by Carolynn Serve, RN Outcome: Progressing 02/14/2021 0837 by Carolynn Serve, RN Outcome: Progressing   Problem: Pain Managment: Goal: General experience of comfort will improve 02/14/2021 0838 by Carolynn Serve, RN Outcome: Progressing 02/14/2021 0837 by Carolynn Serve, RN Outcome: Progressing   Problem: Safety: Goal: Ability to remain free from injury will improve 02/14/2021 0838 by Carolynn Serve, RN Outcome: Progressing 02/14/2021 0837 by Carolynn Serve, RN Outcome: Progressing   Problem: Skin Integrity: Goal: Risk for impaired skin integrity will decrease 02/14/2021 0838 by Carolynn Serve, RN Outcome: Progressing 02/14/2021 0837 by Carolynn Serve, RN Outcome: Progressing

## 2021-02-14 NOTE — TOC Progression Note (Signed)
Transition of Care [facility]) - Progression Note    Patient Details  Name: Angel [name] MRN: [id number] Date of Birth: [birth year]  Transition of [facility] CM/SW Contact  Oren Section Cleta Alberts, RN Phone Number: 02/14/2021, 4:48 PM  Clinical Narrative:    Patient has been denied by insurance for admission to LTAC.  Dr. [name] provided peer to peer, with second denial.  I spoke with patient's wife regarding other options for discharge; she prefers patient to go to inpatient rehab, if this is an option.  Will follow progress with therapies tomorrow; hopeful for increased participation and progress.  Should patient not be able to tolerate 3 to 4 hours of therapy daily; wife aware that we will need to look at a skilled nursing facility.  Expected Discharge Plan: IP Rehab Facility Barriers to Discharge: Continued Medical Work up  Expected Discharge Plan and Services Expected Discharge Plan: [place]   Discharge Planning Services: CM Consult   Living arrangements for the past 2 months: Single Family Home                                       Social Determinants of Health (SDOH) Interventions    Readmission Risk Interventions No flowsheet data found.  Reinaldo Raddle, RN, BSN  Trauma/Neuro ICU Case Manager [phone]

## 2021-02-14 NOTE — Procedures (Signed)
Successful removal of right IJ tunneled HD catheter.   After obtaining consent and performing a time-out, the right upper chest was prepped and draped in the normal sterile fashion. Using gentle manual traction the cuff of the catheter was exposed and the catheter was removed in its entirety. Pressure was held until hemostasis was obtained. A sterile dressing was applied. The patient tolerated the procedure well with no immediate complications.   Soyla Dryer, [place] [phone] 02/14/2021, 2:55 PM

## 2021-02-14 NOTE — Progress Notes (Signed)
31 Days Post-Op  Subjective: CC: Passed for regular diet. Tolerated all of breakfast. No abdominal pain, n/v. No real complaints this am. Denies pain currently. Voiding. Last BM 9/28. Did not tolerate capping but sounds like had PMV on most of yesterday and all of this am.    Objective: Vital signs in last 24 hours: Temp:  [97.7 F (36.5 C)-99.1 F (37.3 C)] 97.7 F (36.5 C) (09/29 0730) Pulse Rate:  [61-78] 69 (09/29 0814) Resp:  [11-18] 17 (09/29 0814) BP: (103-130)/(59-73) 117/71 (09/29 0730) SpO2:  [94 %-99 %] 99 % (09/29 0814) FiO2 (%):  [21 %] 21 % (09/29 0814) Weight:  [111.4 kg] 111.4 kg (09/29 0500) Last BM Date: (P) 02/13/21  Intake/Output from previous day: 09/28 0701 - 09/29 0700 In: 480 [P.O.:480] Out: -  Intake/Output this shift: No intake/output data recorded.  PE: Gen:  Alert, NAD HEENT: EOM's intact, pupils equal and round. Trach in place w/ PMV Card:  RRR, no M/G/R heard, palpable pedal pulses Pulm:  CTAB, no W/R/R, rate and effort normal Abd: Soft, protuberant, nontender, +BS Ext:  calves soft and nontender Neuro: follows commands Skin: no rashes noted, warm and dry  Lab Results:  Recent Labs    02/12/21 0500 02/13/21 0410  WBC 5.1 5.8  HGB 7.6* 7.4*  HCT 24.1* 24.7*  PLT 328 343   BMET Recent Labs    02/13/21 0410 02/14/21 0411  NA 133* 133*  K 3.2* 4.2  CL 100 102  CO2 23 23  GLUCOSE 103* 87  BUN 55* 44*  CREATININE 1.29* 1.17  CALCIUM 9.0 9.1   PT/INR No results for input(s): LABPROT, INR in the last 72 hours. CMP     Component Value Date/Time   NA 133 (L) 02/14/2021 0411   K 4.2 02/14/2021 0411   CL 102 02/14/2021 0411   CO2 23 02/14/2021 0411   GLUCOSE 87 02/14/2021 0411   BUN 44 (H) 02/14/2021 0411   CREATININE 1.17 02/14/2021 0411   CALCIUM 9.1 02/14/2021 0411   PROT 5.7 (L) 01/10/2021 1346   ALBUMIN 2.2 (L) 02/14/2021 0411   AST 30 01/10/2021 1346   ALT 34 01/10/2021 1346   ALKPHOS 53 01/10/2021 1346    BILITOT 0.4 01/10/2021 1346   GFRNONAA >60 02/14/2021 0411   Lipase  No results found for: LIPASE  Studies/Results: No results found.  Anti-infectives: Anti-infectives (From admission, onward)    Start     Dose/Rate Route Frequency Ordered Stop   02/04/21 1548  ceFAZolin (ANCEF) IVPB 2g/100 mL premix  Status:  Discontinued        over 30 Minutes  Continuous PRN 02/04/21 1549 02/09/21 1305   02/04/21 1545  ceFAZolin (ANCEF) IVPB 1 g/50 mL premix        1 g 100 mL/hr over 30 Minutes Intravenous  Once 02/04/21 1458 02/04/21 1600   02/04/21 1543  ceFAZolin (ANCEF) 2-4 GM/100ML-% IVPB       Note to Pharmacy: Lytle Butte   : cabinet override      02/04/21 1543 02/04/21 1651   01/11/21 2330  ceFEPIme (MAXIPIME) 2 g in sodium chloride 0.9 % 100 mL IVPB  Status:  Discontinued        2 g 200 mL/hr over 30 Minutes Intravenous Every 24 hours 01/11/21 0711 01/16/21 0907   01/10/21 1645  ampicillin (OMNIPEN) 2 g in sodium chloride 0.9 % 100 mL IVPB  Status:  Discontinued        2 g  300 mL/hr over 20 Minutes Intravenous Every 8 hours 01/10/21 1549 01/16/21 0907   01/09/21 2200  ceFEPIme (MAXIPIME) 2 g in sodium chloride 0.9 % 100 mL IVPB  Status:  Discontinued        2 g 200 mL/hr over 30 Minutes Intravenous Every 12 hours 01/09/21 1458 01/11/21 0711   01/08/21 1515  metroNIDAZOLE (FLAGYL) IVPB 500 mg  Status:  Discontinued        500 mg 100 mL/hr over 60 Minutes Intravenous Every 8 hours 01/08/21 1428 01/10/21 1618   01/03/21 0600  vancomycin (VANCOREADY) IVPB 1250 mg/250 mL  Status:  Discontinued        1,250 mg 166.7 mL/hr over 90 Minutes Intravenous Every 12 hours 01/02/21 1717 01/03/21 0837   01/02/21 1800  vancomycin (VANCOREADY) IVPB 2000 mg/400 mL        2,000 mg 200 mL/hr over 120 Minutes Intravenous  Once 01/02/21 1712 01/02/21 2007   01/02/21 0900  ceFEPIme (MAXIPIME) 2 g in sodium chloride 0.9 % 100 mL IVPB  Status:  Discontinued        2 g 200 mL/hr over 30 Minutes  Intravenous Every 8 hours 01/02/21 0849 01/09/21 1458        Assessment/Plan Fall down stairs 8/12 VDRF - guaifenisen, S/P trach 8/29 by Dr. Bobbye Morton. Has tolerated HTC well, passy muir trials. Trach changed to cuffless #6 on 9/20. There is no XLT #4 cuffless. Failed capping trial 9/28. Consider retrial again soon, doing well with PMV.  TBI/SAH/SDH - NSGY c/s, Dr. Annette Stable. Significant frontal lobe injuries. Keppra x7d for sz ppx (completed) Occipital bone fx - NSGY c/s, Dr. Annette Stable Temporal bone fx extending into middle ear - ENT c/s, Dr. Constance Holster, no acute treatment, will need re-eval hearing and facial nerve  Right TM Rupture - ENT c/s, Dr. Constance Holster [facility] -  cardiology s/o 9/23 with recs: metoprolol 25mg  BID, amiodarone 200 mg BID x1 week, then decrease to 200 mg daily. ABL anemia- 1u PRBC 9/19, hgb stable at 7.4 yesterday. AM CBC. Iron and vitamin c Bilateral pulmonary embolism - Eliquis AKI - Resolved. GFR > 60. Nephrology reports no further need for dialysis. Okay to remove tunneled catheter. I have placed IR order for removal. Hx DM2 - Hypoglycemia after d/c TF's. SSI. Decreased semglee to 16U BID yesterday. RN reports another hypoglycemic episode in the low 60's this am. Asked DM coordinator for updated recs again today.  Hx HTN - PRN meds FEN - Reg diet VTE - SCDs, Eliquis ID - off abx, no fevers Dispo - SNF/LTACH   LOS: 48 days    Jillyn Ledger , [facility] 02/14/2021, 9:49 AM Please see Amion for pager number during day hours 7:00am-4:30pm

## 2021-02-14 NOTE — Plan of Care (Signed)

## 2021-02-14 NOTE — Progress Notes (Addendum)
Speech Language Pathology Treatment: Dysphagia;Cognitive-Linquistic  Patient Details Name: Angel [name] MRN: [id number] DOB: [birth year] Today's Date: 02/14/2021 Time: 5797-2820 SLP Time Calculation (min) (ACUTE ONLY): 22 min  Assessment / Plan / Recommendation Clinical Impression  Pt is making great gains towards goals with Angel [name] at bedside. Dysphagia treatment focused more on education with pt/wife. He did not report significant difference in texture as wife is cutting his meats. No reports of difficulty yesterday from wife.   His PMV donned with lower vocal intensity initially until became more awake as session progressed and able to clear audible secretions. Respiration, heart rate and respiratory rate were unremarkable.   Cognitively he continues to be oriented to place and situation. All aspects of awareness are improving however continues to require mod cues to state physical/cognitive impairments, recognize these and anticipate problems. He is becoming more aware of surroundings and able to retrieve pieces of information/discussions had with wife. Moderate verbal assist provided for problem solving of mental calculations/scenarios (age, temporal situations). [place] level of behaviors present. ST will continue to facilitate cognition, speech/PMV and swallow.   HPI HPI: Angel [name] is a 70 y.o. 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 CT showed R temporal and parietal SAH, SAH anterior frontal lobes  bilaterally. Also sustained right occipital skull fracture, temporal bone fx, right TM rupture, bilateral PE. Intubated 8/12, trach'd 8/29. PMH: DM2, HTN      SLP Plan  Continue with current plan of care      Recommendations for follow up therapy are one component of a multi-disciplinary discharge planning process, led by the attending physician.  Recommendations may be updated based on patient status, additional functional criteria and insurance authorization.     Recommendations  Diet recommendations: Regular;Thin liquid Liquids provided via: Cup;Straw Medication Administration: Whole meds with puree Supervision: Intermittent supervision to cue for compensatory strategies Compensations: Minimize environmental distractions;Clear throat intermittently Postural Changes and/or Swallow Maneuvers: Seated upright 90 degrees      Patient may use Passy-Muir Speech Valve: During all waking hours (remove during sleep) PMSV Supervision: Intermittent         General recommendations: Rehab consult Oral Care Recommendations: Oral care BID Follow up Recommendations: Inpatient Rehab SLP Visit Diagnosis: Dysphagia, unspecified (R13.10);[place]phonia (R49.1);Cognitive communication deficit (U01.561) Plan: Continue with current plan of care       [place], Angel [name]  02/14/2021, 12:16 PM

## 2021-02-14 NOTE — Progress Notes (Signed)
Inpatient Diabetes Program Recommendations  AACE/ADA: New Consensus Statement on Inpatient Glycemic Control (2015)  Target Ranges:  Prepandial:   less than 140 mg/dL      Peak postprandial:   less than 180 mg/dL (1-2 hours)      Critically ill patients:  140 - 180 mg/dL   Lab Results  Component Value Date   GLUCAP 127 (H) 02/14/2021    Review of Glycemic Control Results for Angel [name], Angel [name] (MRN [id number]) as of 02/14/2021 10:03  Ref. Range 02/13/2021 23:36 02/14/2021 03:41 02/14/2021 07:27 02/14/2021 08:13  Glucose-Capillary Latest Ref Range: 70 - 99 mg/dL 144 (H) 105 (H) 66 (L) 127 (H)   Diabetes history: DM2 Outpatient Diabetes medications: Glipizide 2.5 mg daily, Tradjenta 5 mg daily, Metformin 1000 mg BID, Actos 45 mg QHS Current orders for Inpatient glycemic control: Semglee 16 units BID, Novolog 0-20 units Q4H   Inpatient Diabetes Program Recommendations:  Noted mild low this AM of 66 mg/dL even following yesterday's adjustment.  Consider changing correction to Novolog 0-15 units TID & HS now that patient has diet order.   Thanks, Bronson Curb, MSN, RNC-OB Diabetes Coordinator [phone] (8a-5p)

## 2021-02-15 DIAGNOSIS — Z20822 Contact with and (suspected) exposure to covid-19: Secondary | ICD-10-CM | POA: Diagnosis not present

## 2021-02-15 DIAGNOSIS — S066X9A Traumatic subarachnoid hemorrhage with loss of consciousness of unspecified duration, initial encounter: Secondary | ICD-10-CM | POA: Diagnosis not present

## 2021-02-15 DIAGNOSIS — J9601 Acute respiratory failure with hypoxia: Secondary | ICD-10-CM | POA: Diagnosis not present

## 2021-02-15 DIAGNOSIS — I2699 Other pulmonary embolism without acute cor pulmonale: Secondary | ICD-10-CM | POA: Diagnosis not present

## 2021-02-15 LAB — GLUCOSE, CAPILLARY
Glucose-Capillary: 105 mg/dL — ABNORMAL HIGH (ref 70–99)
Glucose-Capillary: 107 mg/dL — ABNORMAL HIGH (ref 70–99)
Glucose-Capillary: 155 mg/dL — ABNORMAL HIGH (ref 70–99)
Glucose-Capillary: 159 mg/dL — ABNORMAL HIGH (ref 70–99)
Glucose-Capillary: 186 mg/dL — ABNORMAL HIGH (ref 70–99)
Glucose-Capillary: 194 mg/dL — ABNORMAL HIGH (ref 70–99)

## 2021-02-15 LAB — CBC
HCT: 25 % — ABNORMAL LOW (ref 39.0–52.0)
Hemoglobin: 7.8 g/dL — ABNORMAL LOW (ref 13.0–17.0)
MCH: 27.1 pg (ref 26.0–34.0)
MCHC: 31.2 g/dL (ref 30.0–36.0)
MCV: 86.8 fL (ref 80.0–100.0)
Platelets: 304 10*3/uL (ref 150–400)
RBC: 2.88 MIL/uL — ABNORMAL LOW (ref 4.22–5.81)
RDW: 18.1 % — ABNORMAL HIGH (ref 11.5–15.5)
WBC: 4.7 10*3/uL (ref 4.0–10.5)
nRBC: 0 % (ref 0.0–0.2)

## 2021-02-15 LAB — RENAL FUNCTION PANEL
Albumin: 2.3 g/dL — ABNORMAL LOW (ref 3.5–5.0)
Anion gap: 10 (ref 5–15)
BUN: 36 mg/dL — ABNORMAL HIGH (ref 8–23)
CO2: 22 mmol/L (ref 22–32)
Calcium: 9.2 mg/dL (ref 8.9–10.3)
Chloride: 102 mmol/L (ref 98–111)
Creatinine, Ser: 1.21 mg/dL (ref 0.61–1.24)
GFR, Estimated: >60 mL/min (ref >60)
Glucose, Bld: 114 mg/dL — ABNORMAL HIGH (ref 70–99)
Phosphorus: 3.9 mg/dL (ref 2.5–4.6)
Potassium: 3.8 mmol/L (ref 3.5–5.1)
Sodium: 134 mmol/L — ABNORMAL LOW (ref 135–145)

## 2021-02-15 LAB — MAGNESIUM: Magnesium: 1.9 mg/dL (ref 1.7–2.4)

## 2021-02-15 NOTE — Progress Notes (Signed)
Patient ID: Angel [name], male   DOB: [birth year], 70 y.o.   MRN: [id number] [facility] Surgery Progress Note  32 Days Post-Op  Subjective: CC-  Wife at bedside. Patient progressing well. No longer needs LTACH. Looking into SNF but family is hopeful that he can continue to progress and consider CIR. Tolerating regular diet. Tolerated PMV on most of the day again yesterday.  Objective: Vital signs in last 24 hours: Temp:  [97.8 F (36.6 C)-99.4 F (37.4 C)] 98.4 F (36.9 C) (09/30 0734) Pulse Rate:  [62-72] 62 (09/30 0734) Resp:  [16-20] 16 (09/30 0734) BP: (104-137)/(47-75) 104/47 (09/30 0734) SpO2:  [93 %-98 %] 98 % (09/30 0734) FiO2 (%):  [21 %] 21 % (09/30 0325) Weight:  [106.7 kg] 106.7 kg (09/30 0605) Last BM Date: 02/14/21  Intake/Output from previous day: 09/29 0701 - 09/30 0700 In: 440 [P.O.:440] Out: -  Intake/Output this shift: No intake/output data recorded.  PE: Gen:  Alert, NAD HEENT: EOM's intact, pupils equal and round. Trach in place with some secretions Card:  RRR, no M/G/R heard, palpable pedal pulses Pulm:  CTAB, no W/R/R, rate and effort normal Abd: Soft, protuberant, nontender, +BS Ext:  calves soft and nontender Neuro: follows commands Skin: no rashes noted, warm and dry  Lab Results:  Recent Labs    02/13/21 0410 02/15/21 0344  WBC 5.8 4.7  HGB 7.4* 7.8*  HCT 24.7* 25.0*  PLT 343 304   BMET Recent Labs    02/14/21 0411 02/15/21 0344  NA 133* 134*  K 4.2 3.8  CL 102 102  CO2 23 22  GLUCOSE 87 114*  BUN 44* 36*  CREATININE 1.17 1.21  CALCIUM 9.1 9.2   PT/INR No results for input(s): LABPROT, INR in the last 72 hours. CMP     Component Value Date/Time   NA 134 (L) 02/15/2021 0344   K 3.8 02/15/2021 0344   CL 102 02/15/2021 0344   CO2 22 02/15/2021 0344   GLUCOSE 114 (H) 02/15/2021 0344   BUN 36 (H) 02/15/2021 0344   CREATININE 1.21 02/15/2021 0344   CALCIUM 9.2 02/15/2021 0344   PROT 5.7 (L) 01/10/2021 1346    ALBUMIN 2.3 (L) 02/15/2021 0344   AST 30 01/10/2021 1346   ALT 34 01/10/2021 1346   ALKPHOS 53 01/10/2021 1346   BILITOT 0.4 01/10/2021 1346   GFRNONAA >60 02/15/2021 0344   Lipase  No results found for: LIPASE     Studies/Results: IR Removal Tun Cv Cath W/O FL  Result Date: 02/14/2021 INDICATION: Patient with a tunneled dialysis catheter placed for acute kidney injury has experienced renal recovery. Interventional radiology asked to remove tunneled dialysis catheter that was placed February 05, 2021 by IR. EXAM: REMOVAL TUNNELED CENTRAL VENOUS CATHETER MEDICATIONS: None ANESTHESIA/SEDATION: None FLUOROSCOPY TIME:  None COMPLICATIONS: None immediate. PROCEDURE: Informed written consent was obtained from the patient's wife after a thorough discussion of the procedural risks, benefits and alternatives. All questions were addressed. Maximal Sterile Barrier Technique was utilized including caps, mask, sterile gowns, sterile gloves, sterile drape, hand hygiene and skin antiseptic. A timeout was performed prior to the initiation of the procedure. The patient's right chest and catheter were prepped and draped in a normal sterile fashion. Using gentle manual traction the cuff of the catheter was exposed and the catheter was removed in it's entirety. Pressure was held till hemostasis was obtained. A sterile dressing was applied. The patient tolerated the procedure well with no immediate complications. IMPRESSION: Successful catheter removal  as described above. Read by: Soyla Dryer, NP Electronically Signed   By: Markus Daft M.D.   On: 02/14/2021 14:54    Anti-infectives: Anti-infectives (From admission, onward)    Start     Dose/Rate Route Frequency Ordered Stop   02/04/21 1548  ceFAZolin (ANCEF) IVPB 2g/100 mL premix  Status:  Discontinued        over 30 Minutes  Continuous PRN 02/04/21 1549 02/09/21 1305   02/04/21 1545  ceFAZolin (ANCEF) IVPB 1 g/50 mL premix        1 g 100 mL/hr over 30  Minutes Intravenous  Once 02/04/21 1458 02/04/21 1600   02/04/21 1543  ceFAZolin (ANCEF) 2-4 GM/100ML-% IVPB       Note to Pharmacy: Lytle Butte   : cabinet override      02/04/21 1543 02/04/21 1651   01/11/21 2330  ceFEPIme (MAXIPIME) 2 g in sodium chloride 0.9 % 100 mL IVPB  Status:  Discontinued        2 g 200 mL/hr over 30 Minutes Intravenous Every 24 hours 01/11/21 0711 01/16/21 0907   01/10/21 1645  ampicillin (OMNIPEN) 2 g in sodium chloride 0.9 % 100 mL IVPB  Status:  Discontinued        2 g 300 mL/hr over 20 Minutes Intravenous Every 8 hours 01/10/21 1549 01/16/21 0907   01/09/21 2200  ceFEPIme (MAXIPIME) 2 g in sodium chloride 0.9 % 100 mL IVPB  Status:  Discontinued        2 g 200 mL/hr over 30 Minutes Intravenous Every 12 hours 01/09/21 1458 01/11/21 0711   01/08/21 1515  metroNIDAZOLE (FLAGYL) IVPB 500 mg  Status:  Discontinued        500 mg 100 mL/hr over 60 Minutes Intravenous Every 8 hours 01/08/21 1428 01/10/21 1618   01/03/21 0600  vancomycin (VANCOREADY) IVPB 1250 mg/250 mL  Status:  Discontinued        1,250 mg 166.7 mL/hr over 90 Minutes Intravenous Every 12 hours 01/02/21 1717 01/03/21 0837   01/02/21 1800  vancomycin (VANCOREADY) IVPB 2000 mg/400 mL        2,000 mg 200 mL/hr over 120 Minutes Intravenous  Once 01/02/21 1712 01/02/21 2007   01/02/21 0900  ceFEPIme (MAXIPIME) 2 g in sodium chloride 0.9 % 100 mL IVPB  Status:  Discontinued        2 g 200 mL/hr over 30 Minutes Intravenous Every 8 hours 01/02/21 0849 01/09/21 1458        Assessment/Plan Fall down stairs 8/12 VDRF - guaifenisen, S/P trach 8/29 by Dr. Bobbye Morton. Has tolerated HTC well, passy muir trials. Trach changed to cuffless #6 on 9/20. There is no XLT #4 cuffless. Failed capping trial 9/28. Cap trach again today 9/30 TBI/SAH/SDH - NSGY c/s, Dr. Annette Stable. Significant frontal lobe injuries. Keppra x7d for sz ppx (completed) Occipital bone fx - NSGY c/s, Dr. Annette Stable Temporal bone fx extending into middle  ear - ENT c/s, Dr. Constance Holster, no acute treatment, will need re-eval hearing and facial nerve  Right TM Rupture - ENT c/s, Dr. Constance Holster [facility] -  cardiology s/o 9/23 with recs: metoprolol 25mg  BID, amiodarone 200 mg BID x1 week, then decrease to 200 mg daily. ABL anemia- 1u PRBC 9/19, hgb stable at 7.8. Continue Iron and vitamin c Bilateral pulmonary embolism - Eliquis AKI - Resolved. GFR > 60. Nephrology reports no further need for dialysis. Tunneled catheter out Hx DM2 - SSI. Decreased semglee to 16U BID 9/28. Appreciate DM coordinator assistance  Hx HTN - PRN meds FEN - Reg diet VTE - SCDs, Eliquis ID - off abx, no fevers Dispo - No longer needs LTACH. Looking into SNF. May progress to CIR. Trach capping trial today.   LOS: 49 days    [facility] 02/15/2021, 9:11 AM Please see Amion for pager number during day hours 7:00am-4:30pm

## 2021-02-15 NOTE — Progress Notes (Signed)
? ?  Inpatient Rehab Admissions Coordinator : ? ?Per therapy recommendations, patient was screened for CIR candidacy by Tawnya Pujol RN MSN.  At this time patient appears to be a potential candidate for CIR. I will place a rehab consult per protocol for full assessment. Please call me with any questions. ? ?Briget Shaheed RN MSN ?Admissions Coordinator ?[phone] ?  ?

## 2021-02-15 NOTE — Progress Notes (Signed)
Physical Therapy Treatment Patient Details Name: Angel [name] MRN: [id number] DOB: 12/12/1950 Today's Date: 02/15/2021   History of Present Illness Angel [name] is a 70 y.o. 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 CT showed R temporal and parietal SAH, SAH anterior frontal lobes  bilaterally. Also sustained right occipital skull fracture, temporal bone fx, right TM rupture, bilateral PE. Intubated 8/12, trach'd 8/29. CRRT 9/1- 9/9.  PMH: DM2, HTN    PT Comments    Patient able to participate more in supine to sit, sitting balance and sit to partial stand today to allow transfer with use of Stedy.  Still very weak, deconditioned, poor tolerance to upright needing ace wraps to bilat LE's and limited tolerance to sitting in chair.  His wife is present this session and supportive.  Feel he may be able to tolerate CIR level rehab to allow eventual d/c home with family assist.  PT will continue to follow.    Recommendations for follow up therapy are one component of a multi-disciplinary discharge planning process, led by the attending physician.  Recommendations may be updated based on patient status, additional functional criteria and insurance authorization.  Follow Up Recommendations  CIR     Equipment Recommendations  Other (comment) (TBA)    Recommendations for Other Services Rehab consult     Precautions / Restrictions Precautions Precautions: Fall Precaution Comments: trach collar, cortrak, watch BP HR     Mobility  Bed Mobility Overal bed mobility: Needs Assistance Bed Mobility: Supine to Sit     Supine to sit: Mod assist;+2 for physical assistance;HOB elevated     General bed mobility comments: increased time and multimodal cues for initiation to move legs off bed, encouragement to try to scoot hips as stated he couldn't then he did.  Mod A for trunk elevation and finishing scooting out to EOB    Transfers Overall transfer level: Needs assistance    Transfers: Sit to/from Stand;Stand Pivot Transfers Sit to Stand: Max assist;+2 physical assistance;From elevated surface (gait belt and bed pad used for anterior weight shift) Stand pivot transfers: Total assist       General transfer comment: up to attempt to stand x 3, on third try able to get hips up off bed and place flaps down on Stedy; moved on Stedy to recliner then max A+2 to get up enough to remove flaps and sit in recliner.  Ambulation/Gait                 Stairs             Wheelchair Mobility    Modified Rankin (Stroke Patients Only)       Balance Overall balance assessment: Needs assistance;History of Falls Sitting-balance support: Feet supported;Single extremity supported;Bilateral upper extremity supported Sitting balance-Leahy Scale: Poor Sitting balance - Comments: sitting EOB with close S to CGA occasional min A OT playing "The Monkeys" and pt engaged with trunk movements with close S to CGA.                                    Cognition Arousal/Alertness: Awake/alert Behavior During Therapy: Flat affect Overall Cognitive Status: Impaired/Different from baseline Area of Impairment: Orientation;Attention;Memory;Safety/judgement;Awareness;Problem solving               Rancho Levels of Cognitive Functioning Rancho Los Amigos Scales of Cognitive Functioning: Confused/appropriate Orientation Level: Disoriented to;Place;Time Current Attention Level: Sustained Memory:  Decreased short-term memory Following Commands: Follows one step commands consistently Safety/Judgement: Decreased awareness of safety;Decreased awareness of deficits Awareness: Emergent Problem Solving: Slow processing General Comments: Improved level of arousal; engaging during session; increased awareness of limitations and able to report how he feels; awareness of decreased memory      Exercises Total Joint Exercises Quad Sets: Strengthening;Both;10  reps;Supine Towel Squeeze: Strengthening;Both;10 reps;Supine General Exercises - Upper Extremity Shoulder Flexion: 10 reps;AROM;AAROM;Both;Seated Elbow Flexion: AAROM;AROM;Both;Seated Elbow Extension: AROM;AAROM;Both;10 reps;Seated Digit Composite Flexion: Strengthening;Both;15 reps;Squeeze ball Other Exercises Other Exercises: sit - stand x 3 using Stedy    General Comments General comments (skin integrity, edema, etc.): ace wrap to B LE for BP management with upright activity      Pertinent Vitals/Pain Pain Assessment: Faces Faces Pain Scale: Hurts little more Pain Location: back Pain Descriptors / Indicators: Grimacing Pain Intervention(s): Monitored during session    Home Living                      Prior Function            PT Goals (current goals can now be found in the care plan section) Acute Rehab PT Goals Patient Stated Goal: to get stronger Progress towards PT goals: Progressing toward goals    Frequency    Min 3X/week      PT Plan Discharge plan needs to be updated    Co-evaluation PT/OT/SLP Co-Evaluation/Treatment: Yes Reason for Co-Treatment: For patient/therapist safety;To address functional/ADL transfers;Complexity of the patient's impairments (multi-system involvement) PT goals addressed during session: Mobility/safety with mobility;Balance OT goals addressed during session: ADL's and self-care;Strengthening/ROM      AM-PAC PT "6 Clicks" Mobility   Outcome Measure  Help needed turning from your back to your side while in a flat bed without using bedrails?: A Lot Help needed moving from lying on your back to sitting on the side of a flat bed without using bedrails?: Total Help needed moving to and from a bed to a chair (including a wheelchair)?: Total Help needed standing up from a chair using your arms (e.g., wheelchair or bedside chair)?: Total Help needed to walk in hospital room?: Total Help needed climbing 3-5 steps with a  railing? : Total 6 Click Score: 7    End of Session Equipment Utilized During Treatment: Gait belt;Oxygen Activity Tolerance: Patient tolerated treatment well Patient left: in chair;with call bell/phone within reach;with chair alarm set;with family/visitor present Nurse Communication: Need for lift equipment;Mobility status PT Visit Diagnosis: Other abnormalities of gait and mobility (R26.89);Muscle weakness (generalized) (M62.81);Other symptoms and signs involving the nervous system (R29.898)     Time: 6389-3734 PT Time Calculation (min) (ACUTE ONLY): 33 min  Charges:  $Therapeutic Activity: 23-37 mins                     Magda Kiel, PT Acute Rehabilitation Services KAJGO:[phone] Office:[phone] 02/15/2021    Reginia Naas 02/15/2021, 3:45 PM

## 2021-02-15 NOTE — Progress Notes (Signed)
Occupational Therapy Treatment Patient Details Name: Angel [name] MRN: [id number] DOB: [birth year] Today's Date: 02/15/2021   History of present illness Angel [name] is a 70 y.o. 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 CT showed R temporal and parietal SAH, SAH anterior frontal lobes  bilaterally. Also sustained right occipital skull fracture, temporal bone fx, right TM rupture, bilateral PE. Intubated 8/12, trach'd 8/29. CRRT 9/1- 9/9.  PMH: DM2, HTN   OT comments  Excellent session as cotreat with PT. Able to progress to EOB with mod A +2 and then stand EOB using Stedy with max A +2. On 3rd trial able to stand upright to place paddles and allow for transfer to recliner. Once seated in recliner focused session on increasing independence with self feeding and grooming. Wife educated on bed/chair level exercises to complete with her husband. Although fatigued, pt tolerated session well. Feel pt would benefit from extended intensive rehab at [facility] however will most likely need 24/7 assist after DC. Will continue to follow acutely.    Recommendations for follow up therapy are one component of a multi-disciplinary discharge planning process, led by the attending physician.  Recommendations may be updated based on patient status, additional functional criteria and insurance authorization.    Follow Up Recommendations       Equipment Recommendations  3 in 1 bedside commode;Wheelchair (measurements OT);Wheelchair cushion (measurements OT);Hospital bed    Recommendations for Other Services Rehab consult    Precautions / Restrictions Precautions Precautions: Fall Precaution Comments: trach collar, cortrak, watch BP HR       Mobility Bed Mobility Overal bed mobility: Needs Assistance Bed Mobility: Supine to Sit     Supine to sit: Mod assist;+2 for physical assistance;HOB elevated          Transfers Overall transfer level: Needs assistance   Transfers: Sit to/from Stand Sit  to Stand: Max assist (gait belt and bed pad used for anterior shift)              Balance Overall balance assessment: Needs assistance   Sitting balance-Leahy Scale: Fair                                     ADL either performed or assessed with clinical judgement   ADL Overall ADL's : Needs assistance/impaired Eating/Feeding: Moderate assistance Eating/Feeding Details (indicate cue type and reason): PMV; issued red tubing; fatigue affects aability to self feed Grooming: Moderate assistance;Sitting   Upper Body Bathing: Maximal assistance;Sitting   Lower Body Bathing: Total assistance   Upper Body Dressing : Maximal assistance   Lower Body Dressing: Total assistance               Functional mobility during ADLs: Maximal assistance;+2 for safety/equipment;+2 for physical assistance [name])       Vision       Perception     Praxis      Cognition Arousal/Alertness: Awake/alert Behavior During Therapy: Flat affect Overall Cognitive Status: Impaired/Different from baseline Area of Impairment: Orientation;Attention;Memory;Safety/judgement;Awareness;Problem solving                 Orientation Level: Disoriented to;Place;Time Current Attention Level: Sustained Memory: Decreased short-term memory Following Commands: Follows one step commands consistently Safety/Judgement: Decreased awareness of safety;Decreased awareness of deficits Awareness: Emergent Problem Solving: Slow processing General Comments: Improved level of arousal; engaging during session; increased awareness of limitations and able to report how he  feels; awareness of decreased memory        Exercises General Exercises - Upper Extremity Shoulder Flexion: 10 reps;AROM;AAROM;Both;Seated Elbow Flexion: AAROM;AROM;Both;Seated Elbow Extension: AROM;AAROM;Both;10 reps;Seated Digit Composite Flexion: Strengthening;Both;15 reps;Squeeze ball Other Exercises Other Exercises: sit -  stand x 3 using Stedy   Shoulder Instructions       General Comments  BP @ 99/60  - BLE wrapped with ace wraps; BP stable after transfer to chair- Nursing sec asked to order TED hose to help with BP    Pertinent Vitals/ Pain       Pain Assessment: Faces Faces Pain Scale: Hurts little more Pain Location: back Pain Descriptors / Indicators: Grimacing Pain Intervention(s): Limited activity within patient's tolerance  Home Living                                          Prior Functioning/Environment              Frequency  Min 2X/week        Progress Toward Goals  OT Goals(current goals can now be found in the care plan section)  Progress towards OT goals: Progressing toward goals  Acute Rehab OT Goals Patient Stated Goal: to get stronger OT Goal Formulation: With patient/family Time For Goal Achievement: 02/26/21 Potential to Achieve Goals: Good ADL Goals Pt Will Perform Eating: with set-up;with supervision;with adaptive utensils;sitting Pt Will Perform Grooming: with set-up;with supervision;sitting;with adaptive equipment Pt Will Perform Upper Body Bathing: with set-up;with supervision;sitting Pt Will Perform Lower Body Bathing: with mod assist;with adaptive equipment;bed level Pt Will Transfer to Toilet: with +2 assist;bedside commode;with mod assist Pt Will Perform Toileting - Clothing Manipulation and hygiene: with mod assist;sitting/lateral leans;with adaptive equipment Additional ADL Goal #1: pt will follow 2 step comamnds 50% of session Additional ADL Goal #2: pt will visually locate 2 adl items 50% of request Additional ADL Goal #3: pt will static sit eob mod (A) for 10 minutes with stable VSS  Plan Discharge plan needs to be updated    Co-evaluation    PT/OT/SLP Co-Evaluation/Treatment: Yes Reason for Co-Treatment: Complexity of the patient's impairments (multi-system involvement);For patient/therapist safety;To address functional/ADL  transfers   OT goals addressed during session: ADL's and self-care;Strengthening/ROM      AM-PAC OT "6 Clicks" Daily Activity     Outcome Measure   Help from another person eating meals?: A Lot Help from another person taking care of personal grooming?: A Lot Help from another person toileting, which includes using toliet, bedpan, or urinal?: Total Help from another person bathing (including washing, rinsing, drying)?: A Lot Help from another person to put on and taking off regular upper body clothing?: A Lot Help from another person to put on and taking off regular lower body clothing?: Total 6 Click Score: 10    End of Session Equipment Utilized During Treatment: Oxygen (5L 28% FiO2)  OT Visit Diagnosis: Unsteadiness on feet (R26.81);Other abnormalities of gait and mobility (R26.89);Muscle weakness (generalized) (M62.81);Other symptoms and signs involving cognitive function;Pain Pain - Right/Left:  (back) Pain - part of body:  (back)   Activity Tolerance Patient tolerated treatment well   Patient Left in chair;with call bell/phone within reach;with chair alarm set;with family/visitor present   Nurse Communication Mobility status;Need for lift equipment Mayo Ao)        Time: 1610-9604 OT Time Calculation (min): 52 min  Charges: OT General Charges $OT Visit: 1  Visit OT Treatments $Self Care/Home Management : 8-22 mins $Therapeutic Exercise: 8-22 mins  Maurie Boettcher, OT/L   Acute OT Clinical Specialist Acute Rehabilitation Services Pager [phone] Office [phone]   [facility] 02/15/2021, 1:35 PM

## 2021-02-15 NOTE — Progress Notes (Signed)
Attempted to cap trach as ordered. Pt did not tolerate well. He began gasping for air and motioning for it to be taken off. Vitals did not change during trial. Wife at bedside.

## 2021-02-15 NOTE — Progress Notes (Signed)
Patient could not take his pacerone or metoprolol today and I paged PA, Brooke. And she said to go ahead and hold it.

## 2021-02-16 DIAGNOSIS — I2699 Other pulmonary embolism without acute cor pulmonale: Secondary | ICD-10-CM | POA: Diagnosis not present

## 2021-02-16 DIAGNOSIS — Z20822 Contact with and (suspected) exposure to covid-19: Secondary | ICD-10-CM | POA: Diagnosis not present

## 2021-02-16 DIAGNOSIS — S066X9A Traumatic subarachnoid hemorrhage with loss of consciousness of unspecified duration, initial encounter: Secondary | ICD-10-CM | POA: Diagnosis not present

## 2021-02-16 DIAGNOSIS — J9601 Acute respiratory failure with hypoxia: Secondary | ICD-10-CM | POA: Diagnosis not present

## 2021-02-16 LAB — GLUCOSE, CAPILLARY
Glucose-Capillary: 113 mg/dL — ABNORMAL HIGH (ref 70–99)
Glucose-Capillary: 120 mg/dL — ABNORMAL HIGH (ref 70–99)
Glucose-Capillary: 129 mg/dL — ABNORMAL HIGH (ref 70–99)
Glucose-Capillary: 153 mg/dL — ABNORMAL HIGH (ref 70–99)
Glucose-Capillary: 126 mg/dL — ABNORMAL HIGH (ref 70–99)

## 2021-02-16 LAB — RENAL FUNCTION PANEL
Albumin: 2.3 g/dL — ABNORMAL LOW (ref 3.5–5.0)
Anion gap: 9 (ref 5–15)
BUN: 30 mg/dL — ABNORMAL HIGH (ref 8–23)
CO2: 20 mmol/L — ABNORMAL LOW (ref 22–32)
Calcium: 8.9 mg/dL (ref 8.9–10.3)
Chloride: 104 mmol/L (ref 98–111)
Creatinine, Ser: 1.16 mg/dL (ref 0.61–1.24)
GFR, Estimated: >60 mL/min (ref >60)
Glucose, Bld: 109 mg/dL — ABNORMAL HIGH (ref 70–99)
Phosphorus: 3.6 mg/dL (ref 2.5–4.6)
Potassium: 3.5 mmol/L (ref 3.5–5.1)
Sodium: 133 mmol/L — ABNORMAL LOW (ref 135–145)

## 2021-02-16 LAB — MAGNESIUM: Magnesium: 1.7 mg/dL (ref 1.7–2.4)

## 2021-02-16 MED ORDER — IPRATROPIUM-ALBUTEROL 0.5-2.5 (3) MG/3ML IN SOLN
3.0000 mL | Freq: Four times a day (QID) | RESPIRATORY_TRACT | Status: DC | PRN
  Administered 2021-02-16 – 2021-02-24 (×3): 3 mL via RESPIRATORY_TRACT
  Filled 2021-02-16 (×3): qty 3

## 2021-02-16 NOTE — PMR Pre-admission (Signed)
PMR Admission Coordinator Pre-Admission Assessment  Angel [name]: Angel [name] is an 70 y.o., male MRN: [id number] DOB: Aug 08, 1950 Height: $RemoveBefo'6\' 2"'piuxXiuRIYf$  (188 cm) Weight: 110.4 kg  Insurance Information HMO: yes     PPO:      PCP:      IPA:      80/20:      OTHER:  PRIMARY: [employer]      Policy#: [id number]      Subscriber: Angel [name] CM Name: Lynelle Smoke       Phone#: [phone]     Fax#:  I received a call from Lucien at [employer] granting approval on 03/01/21 for 7 days. Do not need to send clinical updates as they have Epic access.  Pre-Cert#: [id number]      Employer: n/a Benefits:  Phone #: [phone]     Name:  Irene Shipper Date: 08/18/2015 - still active Deductible: no deductible ($0) OOP Max: $3,450 ($ 284.42 met) CIR: $325/day co-pay for days 1-6, $0/day days 7-90 SNF:  $0/day co-pay for days 1-20, $184/day co-pay for days 21-100; limited to 100 days/benefit period Outpatient: $30/visit co-pay; limited by medical necessity Home Health:  100% coverage DME: 80% coverage; 20% co-insurance Providers: in network SECONDARY:  none     Policy#:      Phone#:   Development worker, community:       Phone#:   The Therapist, art Information Summary" for patients in Inpatient Rehabilitation Facilities with attached "Privacy Act [place] Records" was provided and verbally reviewed with: Angel [name] and Family  Emergency Contact Information Contact Information     Name Relation Home Work Mobile   Angel [name]  [phone]  [phone]   Angel [name] Daughter   [phone]   Angel [name]   [phone]       Current Medical History  Angel [name] Admitting Diagnosis: Fall with TBI  History of Present Illness:  Angel [name]. Blankley is a 70 year old right-handed male with history of diabetes mellitus, hyperlipidemia and hypertension.  Per chart review Angel [name] lives with spouse.  1 level home with level entry.  Independent prior to admission.  Presented 12/28/2020 after a fall down approximately 8 steps while going  down to his basement with positive loss of consciousness.  Angel [name] reportedly agitated in route confused with emesis x2.  He did have blood coming from his right ear.  He required intubation for airway protection.  Cranial CT scan of the head as well as maxillofacial showed subarachnoid hemorrhage over the posterior right temporal lobe and parietal lobe.  Additional extra-axial hemorrhage over the right convexity.  Areas of subarachnoid hemorrhage in the anterior frontal lobes bilaterally with blood along the falx likely subdural.  Minimally displaced right occipital skull fracture.  A longitudinal right temporal bone fracture extends through the right middle ear cavity.  CT cervical spine negative.  CT of chest abdomen pelvis showed no evidence of acute trauma.  There was an incidental finding of 2.9 cm heterogeneous right thyroid nodule present recommend thyroid ultrasound as outpatient.  Admission chemistries unremarkable except potassium 3.3, glucose 166, alcohol negative, hemoglobin 10.8, lactic acid 4.0.  Neurosurgery follow-up for [facility] Dr. Annette Stable recommended conservative care.  Maintained on Keppra x7 days for seizure prophylaxis.  ENT Dr. Constance Holster in regards to temporal bone fractures again advised conservative care.  Hospital course cardiology services consulted 01/07/2021 for evaluation of atrial fibrillation with RVR started on amiodarone with bolus load.  Echocardiogram showed ejection fraction of 60 to 65% no wall motion abnormalities.  CT angiogram of the chest showed bilateral pulmonary  emboli most significantly affecting the right lower lobe pulmonary artery with associated reduced perfusion of the posterior medial basilar segments of the right lower lobe.  Vascular ultrasound left upper extremity findings consistent with age-indeterminate superficial vein thrombosis involving the left cephalic vein and left basilic vein 0/80/2233.  Angel [name] underwent placement of tracheostomy 01/14/2021 per Dr.Lovick.   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  His tracheostomy tube was downsized to a #6 cuffless 02/05/2021 and to a #4 cuffless 02/20/2021 and decannulated 02/26/2021.  Hospital course complicated by AKI with nephrology services consulted 01/13/2021 with creatinine 4.5-4.96 felt most likely secondary to hypotensive episode suspect hospital course sepsis and IV contrast.  He did require short course of hemodialysis and tunneled catheter had been placed with latest hemodialysis 02/08/2021 and AKI resolved with latest creatinine 1.06 and tunneled catheter has since been removed.  Initially with nasogastric tube feeds for nutritional support and his diet has been advanced to a regular consistency.  Hospital course acute blood loss anemia transfuse 1 unit packed red blood cells 02/04/2021 with latest hemoglobin 8.4.  Palliative care has been consulted to establish goals of care.  Therapy evaluations completed due to Angel [name]'s TBI/SAH was admitted for a comprehensive rehab program.    Angel [name]'s medical record from [facility] has been reviewed by the rehabilitation admission coordinator and physician.  Past Medical History  Past Medical History:  Diagnosis Date   DM (diabetes mellitus) ([place])    HLD (hyperlipidemia)    Hypertension     Has the Angel [name] had major surgery during 100 days prior to admission? Yes  Family History   family history is not on file.  Current Medications  Current Facility-Administered Medications:    acetaminophen (TYLENOL) tablet 1,000 mg, 1,000 mg, Oral, Q6H, Georganna Skeans, MD, 1,000 mg at 02/16/21 1204   [EXPIRED] amiodarone (PACERONE) tablet 200 mg, 200 mg, Oral, BID, 200 mg at 02/14/21 0924 **FOLLOWED BY** amiodarone (PACERONE) tablet 200 mg, 200 mg,  Oral, Daily, Donato Heinz, MD, 200 mg at 02/16/21 6122   apixaban (ELIQUIS) tablet 5 mg, 5 mg, Oral, BID, Georganna Skeans, MD, 5 mg at 02/16/21 0843   artificial tears (LACRILUBE) ophthalmic ointment, , Both Eyes, PRN, Jesusita Oka, MD, 1 application at 44/97/53 1724   ascorbic acid (VITAMIN C) tablet 500 mg, 500 mg, Oral, BID, Meuth, Brooke A, PA-C, 500 mg at 02/16/21 0843   chlorhexidine (PERIDEX) 0.12 % solution 15 mL, 15 mL, Mouth Rinse, BID, Georganna Skeans, MD, 15 mL at 02/15/21 2155   docusate sodium (COLACE) capsule 100 mg, 100 mg, Oral, BID, Georganna Skeans, MD, 100 mg at 02/16/21 0843   feeding supplement (NEPRO CARB STEADY) liquid 237 mL, 237 mL, Oral, TID BM, Meuth, Brooke A, PA-C, 237 mL at 02/15/21 2030   ferrous sulfate tablet 325 mg, 325 mg, Oral, BID WC, Meuth, Brooke A, PA-C, 325 mg at 02/16/21 0842   guaiFENesin (ROBITUSSIN) 100 MG/5ML solution 300 mg, 15 mL, Oral, Q4H, Georganna Skeans, MD, 300 mg at 02/16/21 1335   heparin injection 1,000-6,000 Units, 1,000-6,000 Units, CRRT, PRN, Donato Heinz, MD, 2,000 Units at 01/29/21 1807   hydrALAZINE (APRESOLINE) injection 10 mg, 10 mg, Intravenous, Q4H PRN, Jesusita Oka, MD, 10 mg at 01/24/21 1634   HYDROmorphone (DILAUDID) injection 0.5-1 mg, 0.5-1 mg,  Intravenous, Q2H PRN, Georganna Skeans, MD, 1 mg at 01/30/21 0521   insulin aspart (novoLOG) injection 0-15 Units, 0-15 Units, Subcutaneous, TID WC & HS, Maczis, Barth Kirks, PA-C, 2 Units at 02/16/21 1203   insulin glargine-yfgn (SEMGLEE) injection 16 Units, 16 Units, Subcutaneous, BID, Meuth, Brooke A, PA-C, 16 Units at 02/16/21 0839   ipratropium-albuterol (DUONEB) 0.5-2.5 (3) MG/3ML nebulizer solution 3 mL, 3 mL, Nebulization, Q6H PRN, Meuth, Brooke A, PA-C, 3 mL at 02/16/21 1203   MEDLINE mouth rinse, 15 mL, Mouth Rinse, q12n4p, Georganna Skeans, MD, 15 mL at 02/15/21 1600   methocarbamol (ROBAXIN) tablet 1,000 mg, 1,000 mg, Oral, Q8H, Georganna Skeans, MD, 1,000 mg  at 02/16/21 1335   metoprolol tartrate (LOPRESSOR) tablet 25 mg, 25 mg, Oral, BID, Georganna Skeans, MD, 25 mg at 02/16/21 0843   midazolam (VERSED) injection 1 mg, 1 mg, Intravenous, Q2H PRN, Rolm Bookbinder, MD, 1 mg at 01/29/21 1417   ondansetron (ZOFRAN-ODT) disintegrating tablet 4 mg, 4 mg, Oral, Q6H PRN **OR** ondansetron (ZOFRAN) injection 4 mg, 4 mg, Intravenous, Q6H PRN, Jesusita Oka, MD, 4 mg at 01/27/21 4332   oxyCODONE (Oxy IR/ROXICODONE) immediate release tablet 10-15 mg, 10-15 mg, Oral, Q4H PRN, Georganna Skeans, MD, 10 mg at 02/15/21 1717   pantoprazole (PROTONIX) EC tablet 40 mg, 40 mg, Oral, Daily, Georganna Skeans, MD, 40 mg at 02/16/21 0843   polyethylene glycol (MIRALAX / GLYCOLAX) packet 17 g, 17 g, Oral, Daily, Lovick, Montel Culver, MD, 17 g at 02/16/21 0841   QUEtiapine (SEROQUEL) tablet 25 mg, 25 mg, Oral, QHS, Georganna Skeans, MD, 25 mg at 02/15/21 2155   senna (SENOKOT) tablet 8.6 mg, 1 tablet, Oral, Daily, Jesusita Oka, MD, 8.6 mg at 02/16/21 9518  Facility-Administered Medications Ordered in Other Encounters:    lidocaine (cardiac) 100 mg/61m (XYLOCAINE) injection 2%, , Intravenous, Anesthesia Intra-op, FViann FishB, CRNA, 100 mg at 01/11/21 1439   succinylcholine (ANECTINE) syringe, , Intravenous, Anesthesia Intra-op, FViann FishB, CRNA, 140 mg at 01/11/21 1439  Patients Current Diet:  Diet Order             Diet Carb Modified Fluid consistency: Thin; Room service appropriate? Yes  Diet effective now                   Precautions / Restrictions Precautions Precautions: Fall Precaution Comments: trach collar, cortrak, watch BP HR Restrictions Weight Bearing Restrictions: No   Has the Angel [name] had 2 or more falls or a fall with injury in the past year? No  Prior Activity Level Community (5-7x/wk): Went out daily.  Was independent and driving.  Prior Functional Level Self Care: Did the Angel [name] need help bathing, dressing, using the toilet  or eating? Independent  Indoor Mobility: Did the Angel [name] need assistance with walking from room to room (with or without device)? Independent  Stairs: Did the Angel [name] need assistance with internal or external stairs (with or without device)? Independent  Functional Cognition: Did the Angel [name] need help planning regular tasks such as shopping or remembering to take medications? Independent  Angel [name] Information Are you of Hispanic, Latino/a,or Spanish origin?: A. No, not of Hispanic, Latino/a, or Spanish origin What is your race?: A. White Do you need or want an interpreter to communicate with a doctor or health care staff?: 0. No  Angel [name]'s Response To:  Health Literacy and Transportation Is the Angel [name] able to respond to health literacy and transportation needs?: Yes Health Literacy - How often do you need to have  someone help you when you read instructions, pamphlets, or other written material from your doctor or pharmacy?: Never In the past 12 months, has lack of transportation kept you from medical appointments or from getting medications?: No In the past 12 months, has lack of transportation kept you from meetings, work, or from getting things needed for daily living?: No  Development worker, international aid / [place] Devices/Equipment: None Home Equipment: None  Prior Device Use: Indicate devices/aids used by the Angel [name] prior to current illness, exacerbation or injury? None of the above  Current Functional Level Cognition  Arousal/Alertness: Lethargic Overall Cognitive Status: Impaired/Different from baseline Difficult to assess due to: Tracheostomy Current Attention Level: Sustained Orientation Level: Oriented X4 Following Commands: Follows one step commands consistently Safety/Judgement: Decreased awareness of safety, Decreased awareness of deficits General Comments: Improved level of arousal; engaging during session; increased awareness of limitations and able to report  how he feels; awareness of decreased memory Attention: Focused Focused Attention: Impaired Focused Attention Impairment: Verbal basic Awareness: Impaired Awareness Impairment: Emergent impairment Problem Solving:  (will continue to assess) Safety/Judgment: Impaired [employer] Scales of Cognitive Functioning: Confused/appropriate    Extremity Assessment (includes Sensation/Coordination)  Upper Extremity Assessment: Generalized weakness RUE Deficits / Details: Able to touch hand to mouth; AAROM WFL wtih min tightness @ shoulder. Unable to touch top of head; increased tremor noted with R hand; decreased fine motor-in-hand manipulation skills RUE Sensation: decreased light touch, decreased proprioception RUE Coordination: decreased fine motor, decreased gross motor LUE Deficits / Details: LUE stronger overall than R; stronger proximally @ 3+/5; shoulder @ 2+/5; able to complete @ 80 FF os shoulder LUE Sensation: decreased light touch, decreased proprioception LUE Coordination: decreased fine motor, decreased gross motor  Lower Extremity Assessment: Defer to PT evaluation    ADLs  Overall ADL's : Needs assistance/impaired Eating/Feeding: Moderate assistance Eating/Feeding Details (indicate cue type and reason): PMV; issued red tubing; fatigue affects aability to self feed Grooming: Moderate assistance, Sitting Grooming Details (indicate cue type and reason): pt making comment "oh no you used that on my butt" pt needed education the wash cloth was clean and not used during peri care. Upper Body Bathing: Maximal assistance, Sitting Lower Body Bathing: Total assistance Upper Body Dressing : Maximal assistance Lower Body Dressing: Total assistance Lower Body Dressing Details (indicate cue type and reason): did initiate lifting R LE for don of sock Toilet Transfer Details (indicate cue type and reason): incontinence with brief at this time Functional mobility during ADLs: Maximal  assistance, +2 for safety/equipment, +2 for physical assistance [name]) General ADL Comments: transfer from bed to chair with sliding board this session. Total +2 max (A)    Mobility  Overal bed mobility: Needs Assistance Bed Mobility: Supine to Sit Rolling: +2 for physical assistance, Mod assist Sidelying to sit: Max assist, HOB elevated Supine to sit: Mod assist, +2 for physical assistance, HOB elevated Sit to supine: Max assist, +2 for physical assistance General bed mobility comments: increased time and multimodal cues for initiation to move legs off bed, encouragement to try to scoot hips as stated he couldn't then he did.  Mod A for trunk elevation and finishing scooting out to EOB    Transfers  Overall transfer level: Needs assistance Transfer via Lift Equipment: Stedy Transfers: Sit to/from Stand, Risk manager Sit to Stand: Max assist, +2 physical assistance, From elevated surface (gait belt and bed pad used for anterior weight shift) Stand pivot transfers: Total assist  Lateral/Scoot Transfers: Total assist, +  2 physical assistance, With slide board General transfer comment: up to attempt to stand x 3, on third try able to get hips up off bed and place flaps down on Stedy; moved on Stedy to recliner then max A+2 to get up enough to remove flaps and sit in recliner.    Ambulation / Gait / Stairs / Wheelchair Mobility  Ambulation/Gait General Gait Details: unable at this time    Posture / Balance Dynamic Sitting Balance Sitting balance - Comments: sitting EOB with close S to CGA occasional min A OT playing "The Monkeys" and pt engaged with trunk movements with close S to CGA. Balance Overall balance assessment: Needs assistance, History of Falls Sitting-balance support: Feet supported, Single extremity supported, Bilateral upper extremity supported Sitting balance-Leahy Scale: Poor Sitting balance - Comments: sitting EOB with close S to CGA occasional min A OT playing  "The Monkeys" and pt engaged with trunk movements with close S to CGA. Postural control: Right lateral lean    Special needs/care consideration Oxygen Room Air , Diabetic management Yes h/o DM   Previous Home Environment (from acute therapy documentation) Living Arrangements: Spouse/significant other  Lives With: Spouse Available Help at Discharge: Family, Available 24 hours/day Type of Home: House Home Layout: One level Home Access: Level entry Bathroom Shower/Tub: Multimedia programmer: [place]: No Additional Comments: does have basement he would go down 2x/day  Discharge Living Setting Plans for Discharge Living Setting: Angel [name]'s home, House, Lives with (comment) (Lives with wife.) Type of Home at Discharge: House Discharge Home Layout: One level Discharge Home Access: Stairs to enter Entrance Stairs-Rails: None Entrance Stairs-Number of Steps: 1 small step from [place]. Discharge Bathroom Shower/Tub: Walk-in shower, Door Discharge Bathroom Toilet: Handicapped height Discharge Bathroom Accessibility: Yes How Accessible: Accessible via walker Does the Angel [name] have any problems obtaining your medications?: No  Social/Family/Support Systems Angel [name] Roles: Spouse, Parent (Has a wife, son and daughter.) Contact Information: Kain Milosevic - wife - [phone] Anticipated Caregiver: Wife, son, daughter, and niece Ability/Limitations of Caregiver: Wife is not working and can assist.  son can take 5 weeks FMLA to assist after DC as needed.  Dtr can stop in/out to assist. Caregiver Availability: 24/7 Discharge Plan Discussed with Primary Caregiver: Yes Is Caregiver In Agreement with Plan?: Yes Does Caregiver/Family have Issues with Lodging/Transportation while Pt is in Rehab?: No  Goals Angel [name]/Family Goal for Rehab: PT/OT/SLP supervision to min assist goals Expected length of stay: 10-14 days Cultural Considerations: None Pt/Family Agrees to  Admission and willing to participate: Yes Program Orientation Provided & Reviewed with Pt/Caregiver Including Roles  & Responsibilities: Yes  Decrease burden of Care through IP rehab admission: N/A  Possible need for SNF placement upon discharge: Not anticipated  Angel [name] Condition: I have reviewed medical records from [facility], spoken with CM, and Angel [name] and spouse. I met with Angel [name] at the bedside for inpatient rehabilitation assessment.  Angel [name] will benefit from ongoing PT, OT, and SLP, can actively participate in 3 hours of therapy a day 5 days of the week, and can make measurable gains during the admission.  Angel [name] will also benefit from the coordinated team approach during an Inpatient Acute Rehabilitation admission.  The Angel [name] will receive intensive therapy as well as Rehabilitation physician, nursing, social worker, and care management interventions.  Due to bladder management, bowel management, safety, skin/wound care, disease management, medication administration, pain management, Angel [name] education, and Trach care education  the Angel [name] requires 24 hour a day rehabilitation nursing.  The  Angel [name] is currently mod+2 with mobility and basic ADLs.  Discharge setting and therapy post discharge at home with home health is anticipated.  Angel [name] has agreed to participate in the Acute Inpatient Rehabilitation Program and will admit today.  Preadmission Screen Completed By:  Retta Diones, 02/16/2021 3:30 PM ______________________________________________________________________   Discussed status with Dr. Posey Pronto on 03/01/21 at 42 and received approval for admission today.  Admission Coordinator:  Retta Diones, RN, time 1030 Sudie Grumbling 03/01/2021  Assessment/Plan: Diagnosis: TBI Does the need for close, 24 hr/day Medical supervision in concert with the Angel [name]'s rehab needs make it unreasonable for this Angel [name] to be served in a less intensive setting? Yes Co-Morbidities requiring  supervision/potential complications: diabetes mellitus, hyperlipidemia and hypertension Due to bowel management, safety, skin/wound care, disease management, and Angel [name] education, does the Angel [name] require 24 hr/day rehab nursing? Yes Does the Angel [name] require coordinated care of a physician, rehab nurse, PT, OT to address physical and functional deficits in the context of the above medical diagnosis(es)? Yes Addressing deficits in the following areas: balance, endurance, locomotion, strength, transferring, bathing, dressing, toileting, and psychosocial support Can the Angel [name] actively participate in an intensive therapy program of at least 3 hrs of therapy 5 days a week? Yes The potential for Angel [name] to make measurable gains while on inpatient rehab is excellent Anticipated functional outcomes upon discharge from inpatient rehab: supervision and min assist PT, supervision and min assist OT, n/a SLP Estimated rehab length of stay to reach the above functional goals is: 10-14 days. Anticipated discharge destination: Home 10. Overall Rehab/Functional Prognosis: good   MD Signature: Delice Lesch, MD, ABPMR

## 2021-02-16 NOTE — Progress Notes (Signed)
IP rehab admissions - I met with patient and his wife.  Patient has a trach in place so cannot speak much to me.  I gave wife rehab booklets and explained inpatient rehab.  Wife would like to pursue inpatient rehab here at [facility].  Wife does not want SNF placement at this point.  Wife says trach may be capped tomorrow and hopeful for trach removal prior to DC home with wife.  Wife is not working and can provide assistance at home.  Wife has a son who can take 5 weeks FMLA if needed.  Other help includes a niece who is a retired Therapist, sports and a daughter who is in and out.  I will have my partners follow up on Monday for plans.  Call for questions.  [phone]

## 2021-02-16 NOTE — Progress Notes (Signed)
Patient ID: Angel [name], male   DOB: 1950-09-08, 70 y.o.   MRN: [id number] [facility] Surgery Progress Note  33 Days Post-Op  Subjective: CC-  Wife at bedside. Did not tolerate capping trial yesterday. He reports having an episode this morning where he felt SOB. O2 sats remained in upper 90s. RT came and suctioned mucus from trach. Feeling better now.  Objective: Vital signs in last 24 hours: Temp:  [97.4 F (36.3 C)-99.1 F (37.3 C)] 98.7 F (37.1 C) (10/01 0707) Pulse Rate:  [61-74] 65 (10/01 0851) Resp:  [16-18] 16 (10/01 0851) BP: (99-136)/(55-76) 109/62 (10/01 0707) SpO2:  [96 %-100 %] 99 % (10/01 0925) FiO2 (%):  [21 %] 21 % (10/01 0851) Weight:  [110.4 kg] 110.4 kg (10/01 0643) Last BM Date: 02/15/21  Intake/Output from previous day: 09/30 0701 - 10/01 0700 In: 600 [P.O.:600] Out: 400 [Urine:400] Intake/Output this shift: Total I/O In: 240 [P.O.:240] Out: 200 [Urine:200]  PE: Gen:  Alert, NAD HEENT: EOM's intact, pupils equal and round. Trach in place with some secretions Card:  RRR, no M/G/R heard, palpable pedal pulses Pulm:  CTAB, no W/R/R, rate and effort normal Abd: Soft, protuberant, nontender, +BS Ext:  calves soft and nontender Neuro: follows commands Skin: no rashes noted, warm and dry  Lab Results:  Recent Labs    02/15/21 0344  WBC 4.7  HGB 7.8*  HCT 25.0*  PLT 304   BMET Recent Labs    02/15/21 0344 02/16/21 0345  NA 134* 133*  K 3.8 3.5  CL 102 104  CO2 22 20*  GLUCOSE 114* 109*  BUN 36* 30*  CREATININE 1.21 1.16  CALCIUM 9.2 8.9   PT/INR No results for input(s): LABPROT, INR in the last 72 hours. CMP     Component Value Date/Time   NA 133 (L) 02/16/2021 0345   K 3.5 02/16/2021 0345   CL 104 02/16/2021 0345   CO2 20 (L) 02/16/2021 0345   GLUCOSE 109 (H) 02/16/2021 0345   BUN 30 (H) 02/16/2021 0345   CREATININE 1.16 02/16/2021 0345   CALCIUM 8.9 02/16/2021 0345   PROT 5.7 (L) 01/10/2021 1346   ALBUMIN 2.3 (L)  02/16/2021 0345   AST 30 01/10/2021 1346   ALT 34 01/10/2021 1346   ALKPHOS 53 01/10/2021 1346   BILITOT 0.4 01/10/2021 1346   GFRNONAA >60 02/16/2021 0345   Lipase  No results found for: LIPASE     Studies/Results: IR Removal Tun Cv Cath W/O FL  Result Date: 02/14/2021 INDICATION: Patient with a tunneled dialysis catheter placed for acute kidney injury has experienced renal recovery. Interventional radiology asked to remove tunneled dialysis catheter that was placed February 05, 2021 by IR. EXAM: REMOVAL TUNNELED CENTRAL VENOUS CATHETER MEDICATIONS: None ANESTHESIA/SEDATION: None FLUOROSCOPY TIME:  None COMPLICATIONS: None immediate. PROCEDURE: Informed written consent was obtained from the patient's wife after a thorough discussion of the procedural risks, benefits and alternatives. All questions were addressed. Maximal Sterile Barrier Technique was utilized including caps, mask, sterile gowns, sterile gloves, sterile drape, hand hygiene and skin antiseptic. A timeout was performed prior to the initiation of the procedure. The patient's right chest and catheter were prepped and draped in a normal sterile fashion. Using gentle manual traction the cuff of the catheter was exposed and the catheter was removed in it's entirety. Pressure was held till hemostasis was obtained. A sterile dressing was applied. The patient tolerated the procedure well with no immediate complications. IMPRESSION: Successful catheter removal as described above.  Read by: Soyla Dryer, NP Electronically Signed   By: Markus Daft M.D.   On: 02/14/2021 14:54    Anti-infectives: Anti-infectives (From admission, onward)    Start     Dose/Rate Route Frequency Ordered Stop   02/04/21 1548  ceFAZolin (ANCEF) IVPB 2g/100 mL premix  Status:  Discontinued        over 30 Minutes  Continuous PRN 02/04/21 1549 02/09/21 1305   02/04/21 1545  ceFAZolin (ANCEF) IVPB 1 g/50 mL premix        1 g 100 mL/hr over 30 Minutes Intravenous   Once 02/04/21 1458 02/04/21 1600   02/04/21 1543  ceFAZolin (ANCEF) 2-4 GM/100ML-% IVPB       Note to Pharmacy: Lytle [place]   : cabinet override      02/04/21 1543 02/04/21 1651   01/11/21 2330  ceFEPIme (MAXIPIME) 2 g in sodium chloride 0.9 % 100 mL IVPB  Status:  Discontinued        2 g 200 mL/hr over 30 Minutes Intravenous Every 24 hours 01/11/21 0711 01/16/21 0907   01/10/21 1645  ampicillin (OMNIPEN) 2 g in sodium chloride 0.9 % 100 mL IVPB  Status:  Discontinued        2 g 300 mL/hr over 20 Minutes Intravenous Every 8 hours 01/10/21 1549 01/16/21 0907   01/09/21 2200  ceFEPIme (MAXIPIME) 2 g in sodium chloride 0.9 % 100 mL IVPB  Status:  Discontinued        2 g 200 mL/hr over 30 Minutes Intravenous Every 12 hours 01/09/21 1458 01/11/21 0711   01/08/21 1515  metroNIDAZOLE (FLAGYL) IVPB 500 mg  Status:  Discontinued        500 mg 100 mL/hr over 60 Minutes Intravenous Every 8 hours 01/08/21 1428 01/10/21 1618   01/03/21 0600  vancomycin (VANCOREADY) IVPB 1250 mg/250 mL  Status:  Discontinued        1,250 mg 166.7 mL/hr over 90 Minutes Intravenous Every 12 hours 01/02/21 1717 01/03/21 0837   01/02/21 1800  vancomycin (VANCOREADY) IVPB 2000 mg/400 mL        2,000 mg 200 mL/hr over 120 Minutes Intravenous  Once 01/02/21 1712 01/02/21 2007   01/02/21 0900  ceFEPIme (MAXIPIME) 2 g in sodium chloride 0.9 % 100 mL IVPB  Status:  Discontinued        2 g 200 mL/hr over 30 Minutes Intravenous Every 8 hours 01/02/21 0849 01/09/21 1458        Assessment/Plan Fall down stairs 8/12 VDRF - guaifenisen, S/P trach 8/29 by Dr. Bobbye Morton. Has tolerated HTC well, passy muir trials. Trach changed to cuffless #6 on 9/20. There is no XLT #4 cuffless. Failed capping trial 9/28 and 9/30.Consider repeat cap trial in 1-2 days TBI/SAH/SDH - NSGY c/s, Dr. Annette Stable. Significant frontal lobe injuries. Keppra x7d for sz ppx (completed) Occipital bone fx - NSGY c/s, Dr. Annette Stable Temporal bone fx extending into middle  ear - ENT c/s, Dr. Constance Holster, no acute treatment, will need re-eval hearing and facial nerve  Right TM Rupture - ENT c/s, Dr. Constance Holster [facility] -  cardiology s/o 9/23 with recs: metoprolol 25mg  BID, amiodarone 200 mg BID x1 week, then decrease to 200 mg daily. ABL anemia- 1u PRBC 9/19, hgb stable at 7.8 (9/30). Continue Iron and vitamin c Bilateral pulmonary embolism - Eliquis AKI - Resolved. GFR > 60. Nephrology reports no further need for dialysis. Tunneled catheter out Hx DM2 - SSI. Decreased semglee to 16U BID 9/28. Appreciate DM coordinator assistance  Hx HTN - PRN meds FEN - Reg diet VTE - SCDs, Eliquis ID - off abx, no fevers Dispo - No longer needs LTACH. CIR following. Consider capping trach again in 1-2 days. Patient is medically stable for discharge once dispo arranged.   LOS: 50 days    [place] [facility] 02/16/2021, 11:12 AM Please see Amion for pager number during day hours 7:00am-4:30pm

## 2021-02-17 ENCOUNTER — Inpatient Hospital Stay (HOSPITAL_COMMUNITY): Payer: PPO

## 2021-02-17 DIAGNOSIS — Z20822 Contact with and (suspected) exposure to covid-19: Secondary | ICD-10-CM | POA: Diagnosis not present

## 2021-02-17 DIAGNOSIS — I2699 Other pulmonary embolism without acute cor pulmonale: Secondary | ICD-10-CM | POA: Diagnosis not present

## 2021-02-17 DIAGNOSIS — J9601 Acute respiratory failure with hypoxia: Secondary | ICD-10-CM | POA: Diagnosis not present

## 2021-02-17 DIAGNOSIS — S066X9A Traumatic subarachnoid hemorrhage with loss of consciousness of unspecified duration, initial encounter: Secondary | ICD-10-CM | POA: Diagnosis not present

## 2021-02-17 LAB — GLUCOSE, CAPILLARY
Glucose-Capillary: 103 mg/dL — ABNORMAL HIGH (ref 70–99)
Glucose-Capillary: 105 mg/dL — ABNORMAL HIGH (ref 70–99)
Glucose-Capillary: 110 mg/dL — ABNORMAL HIGH (ref 70–99)
Glucose-Capillary: 124 mg/dL — ABNORMAL HIGH (ref 70–99)
Glucose-Capillary: 149 mg/dL — ABNORMAL HIGH (ref 70–99)
Glucose-Capillary: 216 mg/dL — ABNORMAL HIGH (ref 70–99)

## 2021-02-17 LAB — CBC
HCT: 24.9 % — ABNORMAL LOW (ref 39.0–52.0)
Hemoglobin: 7.9 g/dL — ABNORMAL LOW (ref 13.0–17.0)
MCH: 27.8 pg (ref 26.0–34.0)
MCHC: 31.7 g/dL (ref 30.0–36.0)
MCV: 87.7 fL (ref 80.0–100.0)
Platelets: 259 10*3/uL (ref 150–400)
RBC: 2.84 MIL/uL — ABNORMAL LOW (ref 4.22–5.81)
RDW: 19 % — ABNORMAL HIGH (ref 11.5–15.5)
WBC: 5.4 10*3/uL (ref 4.0–10.5)
nRBC: 0 % (ref 0.0–0.2)

## 2021-02-17 LAB — RENAL FUNCTION PANEL
Albumin: 2.3 g/dL — ABNORMAL LOW (ref 3.5–5.0)
Anion gap: 11 (ref 5–15)
BUN: 27 mg/dL — ABNORMAL HIGH (ref 8–23)
CO2: 21 mmol/L — ABNORMAL LOW (ref 22–32)
Calcium: 9 mg/dL (ref 8.9–10.3)
Chloride: 102 mmol/L (ref 98–111)
Creatinine, Ser: 1.19 mg/dL (ref 0.61–1.24)
GFR, Estimated: >60 mL/min (ref >60)
Glucose, Bld: 102 mg/dL — ABNORMAL HIGH (ref 70–99)
Phosphorus: 3.7 mg/dL (ref 2.5–4.6)
Potassium: 3.4 mmol/L — ABNORMAL LOW (ref 3.5–5.1)
Sodium: 134 mmol/L — ABNORMAL LOW (ref 135–145)

## 2021-02-17 LAB — MAGNESIUM: Magnesium: 1.7 mg/dL (ref 1.7–2.4)

## 2021-02-17 MED ORDER — POTASSIUM CHLORIDE 20 MEQ PO PACK
40.0000 meq | PACK | Freq: Two times a day (BID) | ORAL | Status: AC
  Administered 2021-02-17 (×2): 40 meq via ORAL
  Filled 2021-02-17 (×2): qty 2

## 2021-02-17 NOTE — Progress Notes (Signed)
Patient ID: Angel [name], male   DOB: 07-Sep-1950, 70 y.o.   MRN: [id number]  [facility] Surgery Progress Note  34 Days Post-Op  Subjective: CC-  Intermittently hypotensive, HR ok. Patient is tired this morning, otherwise no new complaints. He did not sleep much last night because he was coughing. Denies chest pain or SOB. He did wake up to eat 100% of his breakfast.  Objective: Vital signs in last 24 hours: Temp:  [97.5 F (36.4 C)-99 F (37.2 C)] 98.4 F (36.9 C) (10/02 0748) Pulse Rate:  [58-67] 62 (10/02 1016) Resp:  [14-19] 16 (10/02 0748) BP: (89-137)/(52-78) 101/52 (10/02 1016) SpO2:  [95 %-100 %] 97 % (10/02 1016) FiO2 (%):  [21 %] 21 % (10/02 0446) Last BM Date: 02/15/21  Intake/Output from previous day: 10/01 0701 - 10/02 0700 In: 840 [P.O.:840] Out: 1050 [Urine:1050] Intake/Output this shift: No intake/output data recorded.  PE: Gen:  Alert, NAD, drowsy but does open eyes and respond appropriately HEENT: EOM's intact, pupils equal and round. Trach in place with PMV Card:  RRR, no M/G/R heard, palpable pedal pulses Pulm:  CTAB, no W/R/R, rate and effort normal Abd: Soft, protuberant, nontender, +BS Ext:  calves soft and nontender Neuro: follows commands Skin: no rashes noted, warm and dry   Lab Results:  Recent Labs    02/15/21 0344  WBC 4.7  HGB 7.8*  HCT 25.0*  PLT 304   BMET Recent Labs    02/16/21 0345 02/17/21 0313  NA 133* 134*  K 3.5 3.4*  CL 104 102  CO2 20* 21*  GLUCOSE 109* 102*  BUN 30* 27*  CREATININE 1.16 1.19  CALCIUM 8.9 9.0   PT/INR No results for input(s): LABPROT, INR in the last 72 hours. CMP     Component Value Date/Time   NA 134 (L) 02/17/2021 0313   K 3.4 (L) 02/17/2021 0313   CL 102 02/17/2021 0313   CO2 21 (L) 02/17/2021 0313   GLUCOSE 102 (H) 02/17/2021 0313   BUN 27 (H) 02/17/2021 0313   CREATININE 1.19 02/17/2021 0313   CALCIUM 9.0 02/17/2021 0313   PROT 5.7 (L) 01/10/2021 1346   ALBUMIN 2.3 (L)  02/17/2021 0313   AST 30 01/10/2021 1346   ALT 34 01/10/2021 1346   ALKPHOS 53 01/10/2021 1346   BILITOT 0.4 01/10/2021 1346   GFRNONAA >60 02/17/2021 0313   Lipase  No results found for: LIPASE     Studies/Results: No results found.  Anti-infectives: Anti-infectives (From admission, onward)    Start     Dose/Rate Route Frequency Ordered Stop   02/04/21 1548  ceFAZolin (ANCEF) IVPB 2g/100 mL premix  Status:  Discontinued        over 30 Minutes  Continuous PRN 02/04/21 1549 02/09/21 1305   02/04/21 1545  ceFAZolin (ANCEF) IVPB 1 g/50 mL premix        1 g 100 mL/hr over 30 Minutes Intravenous  Once 02/04/21 1458 02/04/21 1600   02/04/21 1543  ceFAZolin (ANCEF) 2-4 GM/100ML-% IVPB       Note to Pharmacy: [place]   : cabinet override      02/04/21 1543 02/04/21 1651   01/11/21 2330  ceFEPIme (MAXIPIME) 2 g in sodium chloride 0.9 % 100 mL IVPB  Status:  Discontinued        2 g 200 mL/hr over 30 Minutes Intravenous Every 24 hours 01/11/21 0711 01/16/21 0907   01/10/21 1645  ampicillin (OMNIPEN) 2 g in sodium chloride 0.9 %  100 mL IVPB  Status:  Discontinued        2 g 300 mL/hr over 20 Minutes Intravenous Every 8 hours 01/10/21 1549 01/16/21 0907   01/09/21 2200  ceFEPIme (MAXIPIME) 2 g in sodium chloride 0.9 % 100 mL IVPB  Status:  Discontinued        2 g 200 mL/hr over 30 Minutes Intravenous Every 12 hours 01/09/21 1458 01/11/21 0711   01/08/21 1515  metroNIDAZOLE (FLAGYL) IVPB 500 mg  Status:  Discontinued        500 mg 100 mL/hr over 60 Minutes Intravenous Every 8 hours 01/08/21 1428 01/10/21 1618   01/03/21 0600  vancomycin (VANCOREADY) IVPB 1250 mg/250 mL  Status:  Discontinued        1,250 mg 166.7 mL/hr over 90 Minutes Intravenous Every 12 hours 01/02/21 1717 01/03/21 0837   01/02/21 1800  vancomycin (VANCOREADY) IVPB 2000 mg/400 mL        2,000 mg 200 mL/hr over 120 Minutes Intravenous  Once 01/02/21 1712 01/02/21 2007   01/02/21 0900  ceFEPIme (MAXIPIME) 2 g  in sodium chloride 0.9 % 100 mL IVPB  Status:  Discontinued        2 g 200 mL/hr over 30 Minutes Intravenous Every 8 hours 01/02/21 0849 01/09/21 1458        Assessment/Plan Fall down stairs 8/12 VDRF - guaifenisen, S/P trach 8/29 by Dr. Bobbye Morton. Has tolerated HTC well, passy muir trials. Trach changed to cuffless #6 on 9/20. There is no XLT #4 cuffless. Failed capping trial 9/28 and 9/30.Consider repeat cap trial this week TBI/SAH/SDH - NSGY c/s, Dr. Annette Stable. Significant frontal lobe injuries. Keppra x7d for sz ppx (completed) Occipital bone fx - NSGY c/s, Dr. Annette Stable Temporal bone fx extending into middle ear - ENT c/s, Dr. Constance Holster, no acute treatment, will need re-eval hearing and facial nerve  Right TM Rupture - ENT c/s, Dr. Constance Holster [facility] -  cardiology s/o 9/23 with recs: metoprolol 25mg  BID, amiodarone 200 mg BID x1 week, then decrease to 200 mg daily. ABL anemia- 1u PRBC 9/19, hgb stable at 7.8 (9/30). Continue Iron and vitamin c Bilateral pulmonary embolism - Eliquis AKI - Resolved. GFR > 60. Nephrology reports no further need for dialysis. Tunneled catheter out Hx DM2 - SSI. Decreased semglee to 16U BID 9/28. Appreciate DM coordinator assistance  Hx HTN - PRN meds FEN - Reg diet VTE - SCDs, Eliquis ID - off abx, no fevers Dispo - Check CBC and CXR. No longer needs LTACH. CIR following. Consider capping trach again this week. Patient is medically stable for discharge once dispo arranged.   LOS: 51 days    [place] [facility] 02/17/2021, 10:50 AM Please see Amion for pager number during day hours 7:00am-4:30pm

## 2021-02-18 DIAGNOSIS — J9601 Acute respiratory failure with hypoxia: Secondary | ICD-10-CM | POA: Diagnosis not present

## 2021-02-18 DIAGNOSIS — S066X9A Traumatic subarachnoid hemorrhage with loss of consciousness of unspecified duration, initial encounter: Secondary | ICD-10-CM | POA: Diagnosis not present

## 2021-02-18 DIAGNOSIS — Z20822 Contact with and (suspected) exposure to covid-19: Secondary | ICD-10-CM | POA: Diagnosis not present

## 2021-02-18 DIAGNOSIS — I2699 Other pulmonary embolism without acute cor pulmonale: Secondary | ICD-10-CM | POA: Diagnosis not present

## 2021-02-18 LAB — RENAL FUNCTION PANEL
Albumin: 2.3 g/dL — ABNORMAL LOW (ref 3.5–5.0)
Anion gap: 8 (ref 5–15)
BUN: 26 mg/dL — ABNORMAL HIGH (ref 8–23)
CO2: 21 mmol/L — ABNORMAL LOW (ref 22–32)
Calcium: 8.9 mg/dL (ref 8.9–10.3)
Chloride: 105 mmol/L (ref 98–111)
Creatinine, Ser: 1.13 mg/dL (ref 0.61–1.24)
GFR, Estimated: >60 mL/min (ref >60)
Glucose, Bld: 121 mg/dL — ABNORMAL HIGH (ref 70–99)
Phosphorus: 3.6 mg/dL (ref 2.5–4.6)
Potassium: 4.1 mmol/L (ref 3.5–5.1)
Sodium: 134 mmol/L — ABNORMAL LOW (ref 135–145)

## 2021-02-18 LAB — GLUCOSE, CAPILLARY
Glucose-Capillary: 109 mg/dL — ABNORMAL HIGH (ref 70–99)
Glucose-Capillary: 114 mg/dL — ABNORMAL HIGH (ref 70–99)
Glucose-Capillary: 127 mg/dL — ABNORMAL HIGH (ref 70–99)
Glucose-Capillary: 128 mg/dL — ABNORMAL HIGH (ref 70–99)
Glucose-Capillary: 149 mg/dL — ABNORMAL HIGH (ref 70–99)
Glucose-Capillary: 198 mg/dL — ABNORMAL HIGH (ref 70–99)

## 2021-02-18 LAB — MAGNESIUM: Magnesium: 1.9 mg/dL (ref 1.7–2.4)

## 2021-02-18 MED ORDER — BISACODYL 10 MG RE SUPP
10.0000 mg | Freq: Every day | RECTAL | Status: DC | PRN
  Administered 2021-02-18: 10 mg via RECTAL
  Filled 2021-02-18: qty 1

## 2021-02-18 MED ORDER — METOPROLOL TARTRATE 12.5 MG HALF TABLET
12.5000 mg | ORAL_TABLET | Freq: Two times a day (BID) | ORAL | Status: DC
  Administered 2021-02-18 – 2021-03-01 (×18): 12.5 mg via ORAL
  Filled 2021-02-18 (×21): qty 1

## 2021-02-18 NOTE — Progress Notes (Addendum)
Patient ID: Angel [name], male   DOB: 04-07-1951, 70 y.o.   MRN: [id number]  [facility] Surgery Progress Note  35 Days Post-Op  Subjective: CC-  Wife at bedside assisting patient with breakfast. Slept much better last night. No more coughing. Wore PMV most of the day yesterday. BP has been intermittently soft. Patient denies dizziness, CP, or SOB. Hemoglobin stable.   Objective: Vital signs in last 24 hours: Temp:  [98.5 F (36.9 C)-98.9 F (37.2 C)] 98.6 F (37 C) (10/03 0759) Pulse Rate:  [57-83] 64 (10/03 0834) Resp:  [13-19] 15 (10/03 0834) BP: (101-133)/(52-85) 107/67 (10/03 0834) SpO2:  [94 %-100 %] 99 % (10/03 0834) FiO2 (%):  [21 %] 21 % (10/03 0834) Last BM Date: 02/15/21  Intake/Output from previous day: 10/02 0701 - 10/03 0700 In: 60 [P.O.:60] Out: -  Intake/Output this shift: No intake/output data recorded.  PE: Gen:  Alert, NAD HEENT: EOM's intact, pupils equal and round. Trach in place with PMV Card:  RRR, no M/G/R heard, palpable pedal pulses Pulm:  CTAB, no W/R/R, rate and effort normal Abd: Soft, protuberant, nontender, +BS Ext:  calves soft and nontender Neuro: MAEs, follows commands Skin: no rashes noted, warm and dry   Lab Results:  Recent Labs    02/17/21 1106  WBC 5.4  HGB 7.9*  HCT 24.9*  PLT 259   BMET Recent Labs    02/17/21 0313 02/18/21 0158  NA 134* 134*  K 3.4* 4.1  CL 102 105  CO2 21* 21*  GLUCOSE 102* 121*  BUN 27* 26*  CREATININE 1.19 1.13  CALCIUM 9.0 8.9   PT/INR No results for input(s): LABPROT, INR in the last 72 hours. CMP     Component Value Date/Time   NA 134 (L) 02/18/2021 0158   K 4.1 02/18/2021 0158   CL 105 02/18/2021 0158   CO2 21 (L) 02/18/2021 0158   GLUCOSE 121 (H) 02/18/2021 0158   BUN 26 (H) 02/18/2021 0158   CREATININE 1.13 02/18/2021 0158   CALCIUM 8.9 02/18/2021 0158   PROT 5.7 (L) 01/10/2021 1346   ALBUMIN 2.3 (L) 02/18/2021 0158   AST 30 01/10/2021 1346   ALT 34 01/10/2021 1346    ALKPHOS 53 01/10/2021 1346   BILITOT 0.4 01/10/2021 1346   GFRNONAA >60 02/18/2021 0158   Lipase  No results found for: LIPASE     Studies/Results: DG CHEST PORT 1 VIEW  Result Date: 02/17/2021 CLINICAL DATA:  Cough.  Evaluate for pneumothorax. EXAM: PORTABLE CHEST 1 VIEW COMPARISON:  February 04, 2021 FINDINGS: Stable tracheostomy tube. The Vas-Cath is been removed. No pneumothorax. Stable cardiomegaly. The hila and mediastinum are unremarkable. No pulmonary nodules, masses, or focal infiltrates. IMPRESSION: Support apparatus as above.  No acute abnormalities noted. Electronically Signed   By: Dorise Bullion III M.D.   On: 02/17/2021 13:10    Anti-infectives: Anti-infectives (From admission, onward)    Start     Dose/Rate Route Frequency Ordered Stop   02/04/21 1548  ceFAZolin (ANCEF) IVPB 2g/100 mL premix  Status:  Discontinued        over 30 Minutes  Continuous PRN 02/04/21 1549 02/09/21 1305   02/04/21 1545  ceFAZolin (ANCEF) IVPB 1 g/50 mL premix        1 g 100 mL/hr over 30 Minutes Intravenous  Once 02/04/21 1458 02/04/21 1600   02/04/21 1543  ceFAZolin (ANCEF) 2-4 GM/100ML-% IVPB       Note to Pharmacy: [place]   : cabinet  override      02/04/21 1543 02/04/21 1651   01/11/21 2330  ceFEPIme (MAXIPIME) 2 g in sodium chloride 0.9 % 100 mL IVPB  Status:  Discontinued        2 g 200 mL/hr over 30 Minutes Intravenous Every 24 hours 01/11/21 0711 01/16/21 0907   01/10/21 1645  ampicillin (OMNIPEN) 2 g in sodium chloride 0.9 % 100 mL IVPB  Status:  Discontinued        2 g 300 mL/hr over 20 Minutes Intravenous Every 8 hours 01/10/21 1549 01/16/21 0907   01/09/21 2200  ceFEPIme (MAXIPIME) 2 g in sodium chloride 0.9 % 100 mL IVPB  Status:  Discontinued        2 g 200 mL/hr over 30 Minutes Intravenous Every 12 hours 01/09/21 1458 01/11/21 0711   01/08/21 1515  metroNIDAZOLE (FLAGYL) IVPB 500 mg  Status:  Discontinued        500 mg 100 mL/hr over 60 Minutes Intravenous Every  8 hours 01/08/21 1428 01/10/21 1618   01/03/21 0600  vancomycin (VANCOREADY) IVPB 1250 mg/250 mL  Status:  Discontinued        1,250 mg 166.7 mL/hr over 90 Minutes Intravenous Every 12 hours 01/02/21 1717 01/03/21 0837   01/02/21 1800  vancomycin (VANCOREADY) IVPB 2000 mg/400 mL        2,000 mg 200 mL/hr over 120 Minutes Intravenous  Once 01/02/21 1712 01/02/21 2007   01/02/21 0900  ceFEPIme (MAXIPIME) 2 g in sodium chloride 0.9 % 100 mL IVPB  Status:  Discontinued        2 g 200 mL/hr over 30 Minutes Intravenous Every 8 hours 01/02/21 0849 01/09/21 1458        Assessment/Plan Fall down stairs 8/12 VDRF - guaifenisen, S/P trach 8/29 by Dr. Bobbye Morton. Has tolerated HTC well, passy muir trials. Trach changed to cuffless #6 on 9/20. There is no XLT #4 cuffless. Failed capping trial 9/28 and 9/30. Cap again today 10/3 TBI/SAH/SDH - NSGY c/s, Dr. Annette Stable. Significant frontal lobe injuries. Keppra x7d for sz ppx (completed) Occipital bone fx - NSGY c/s, Dr. Annette Stable Temporal bone fx extending into middle ear - ENT c/s, Dr. Constance Holster, no acute treatment, will need re-eval hearing and facial nerve  Right TM Rupture - ENT c/s, Dr. Constance Holster [facility] -  cardiology s/o 9/23 with recs: metoprolol 25mg  BID, amiodarone 200 mg BID x1 week, then decrease to 200 mg daily. Decreased Metoprolol 12.5mg  BID on 10/3 due to hypotension ABL anemia- 1u PRBC 9/19, hgb stable at 7.9 (10/2). Continue Iron and vitamin c Bilateral pulmonary embolism - Eliquis AKI - Resolved. GFR > 60. Nephrology reports no further need for dialysis. Tunneled catheter out Hx DM2 - SSI. Decreased semglee to 16U BID 9/28. Appreciate DM coordinator assistance  Hx HTN - PRN meds FEN - Reg diet VTE - SCDs, Eliquis ID - off abx, no fevers Dispo - Cap trach. Decrease metoprolol as above. No longer needs LTACH. CIR following.  Patient is medically stable for discharge once dispo arranged.   LOS: 52 days    [place]  [facility] 02/18/2021, 9:10 AM Please see Amion for pager number during day hours 7:00am-4:30pm

## 2021-02-18 NOTE — Progress Notes (Signed)
Inpatient Rehab Admissions Coordinator:    I do not have a CIR bed for this Pt. Today. Will follow for potential admit pending bed availability and insurance auth.   Clemens Catholic, [place], [place] Admissions Coordinator  [phone] ([place]) [phone] (office)

## 2021-02-18 NOTE — Progress Notes (Addendum)
Occupational Therapy Treatment Patient Details Name: Angel [name] MRN: [id number] DOB: [birth year] Today's Date: 02/18/2021   History of present illness Angel [name] is a 70 y.o. 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 CT showed R temporal and parietal SAH, SAH anterior frontal lobes  bilaterally. Also sustained right occipital skull fracture, temporal bone fx, right TM rupture, bilateral PE. Intubated 8/12, trach'd 8/29. CRRT 9/1- 9/9.  PMH: DM2, HTN   OT comments  Patient seen by skilled OT to address self feeding and grooming. Patient in bed and indicated he was tired but willing to work with OT. Patient placed in sitting position in bed and addressed self feeding. Patient was unable to hold utensil until red tubing was added and patient was able to feed self.  Grooming addressed with patient required assistance to open tube past but was able to apply to brush.  Patient was able to brush teeth and wash face and hands. Patient has made good progress with self feeding. Continue with OT.    Recommendations for follow up therapy are one component of a multi-disciplinary discharge planning process, led by the attending physician.  Recommendations may be updated based on patient status, additional functional criteria and insurance authorization.    Follow Up Recommendations  CIR    Equipment Recommendations  3 in 1 bedside commode;Wheelchair (measurements OT);Wheelchair cushion (measurements OT);Hospital bed    Recommendations for Other Services Rehab consult    Precautions / Restrictions Precautions Precautions: Fall Precaution Comments: trach collar, cortrak, watch BP HR       Mobility Bed Mobility                    Transfers                      Balance                                           ADL either performed or assessed with clinical judgement   ADL Overall ADL's : Needs assistance/impaired Eating/Feeding: Minimal  assistance;With adaptive utensils Eating/Feeding Details (indicate cue type and reason): Patient had difficulty holding utencils but was able to manage with red tubing applied Grooming: Wash/dry hands;Wash/dry face;Oral care;Minimal assistance Grooming Details (indicate cue type and reason): performed in sitting position in bed. requied assistance opening tube paste                               General ADL Comments: patient required verbal cues for encouragement     Vision       Perception     Praxis      Cognition Arousal/Alertness: Lethargic Behavior During Therapy: Flat affect Overall Cognitive Status: Impaired/Different from baseline Area of Impairment: Orientation;Attention;Memory;Safety/judgement;Awareness;Problem solving                 Orientation Level: Disoriented to;Place;Time Current Attention Level: Sustained Memory: Decreased short-term memory Following Commands: Follows one step commands consistently Safety/Judgement: Decreased awareness of safety;Decreased awareness of deficits Awareness: Emergent Problem Solving: Slow processing General Comments: lethargic but willing to participate with OT        Exercises     Shoulder Instructions       General Comments      Pertinent Vitals/ Pain       Pain Assessment:  Faces Faces Pain Scale: Hurts little more Pain Location: back Pain Descriptors / Indicators: Grimacing Pain Intervention(s): Repositioned  Home Living                                          Prior Functioning/Environment              Frequency  Min 2X/week        Progress Toward Goals  OT Goals(current goals can now be found in the care plan section)  Progress towards OT goals: Progressing toward goals  Acute Rehab OT Goals Patient Stated Goal: to get stronger OT Goal Formulation: With patient/family Time For Goal Achievement: 02/26/21 Potential to Achieve Goals: Good ADL Goals Pt  Will Perform Eating: with set-up;with supervision;with adaptive utensils;sitting Pt Will Perform Grooming: with set-up;with supervision;sitting;with adaptive equipment Pt Will Perform Upper Body Bathing: with set-up;with supervision;sitting Pt Will Perform Lower Body Bathing: with mod assist;with adaptive equipment;bed level Pt Will Transfer to Toilet: with +2 assist;bedside commode;with mod assist Pt Will Perform Toileting - Clothing Manipulation and hygiene: with mod assist;sitting/lateral leans;with adaptive equipment Additional ADL Goal #1: pt will follow 2 step comamnds 50% of session Additional ADL Goal #2: pt will visually locate 2 adl items 50% of request Additional ADL Goal #3: pt will static sit eob mod (A) for 10 minutes with stable VSS  Plan Discharge plan needs to be updated    Co-evaluation                 AM-PAC OT "6 Clicks" Daily Activity     Outcome Measure   Help from another person eating meals?: A Little Help from another person taking care of personal grooming?: A Lot Help from another person toileting, which includes using toliet, bedpan, or urinal?: Total Help from another person bathing (including washing, rinsing, drying)?: A Lot Help from another person to put on and taking off regular upper body clothing?: A Lot Help from another person to put on and taking off regular lower body clothing?: Total 6 Click Score: 11    End of Session Equipment Utilized During Treatment: Oxygen  OT Visit Diagnosis: Unsteadiness on feet (R26.81);Other abnormalities of gait and mobility (R26.89);Muscle weakness (generalized) (M62.81);Other symptoms and signs involving cognitive function;Pain   Activity Tolerance Patient limited by fatigue   Patient Left in bed;with call bell/phone within reach;with bed alarm set   Nurse Communication Other (comment) (discussed self feeding)        Time: 4132-4401 OT Time Calculation (min): 18 min  Charges: OT General  Charges $OT Visit: 1 Visit OT Treatments $Self Care/Home Management : 8-22 mins  Lodema Hong, OTA   Trixie Dredge 02/18/2021, 2:56 PM

## 2021-02-18 NOTE — Progress Notes (Signed)
Pt placed back RA trach collar at this time, unable to tolerate being capped. MD made aware at this time. Vitals stable. RT will continue to monitor.

## 2021-02-18 NOTE — Progress Notes (Signed)
Pt capped at this time, tolerating fairly well. Vitals stable at this time. RT will continue to monitor.

## 2021-02-18 NOTE — Progress Notes (Signed)
Physical Therapy Treatment Patient Details Name: Angel [name] MRN: [id number] DOB: Nov 07, 1950 Today's Date: 02/18/2021   History of Present Illness Angel [name] is a 70 y.o. 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 CT showed R temporal and parietal SAH, SAH anterior frontal lobes  bilaterally. Also sustained right occipital skull fracture, temporal bone fx, right TM rupture, bilateral PE. Intubated 8/12, trach'd 8/29. CRRT 9/1- 9/9.  PMH: DM2, HTN    PT Comments    Pt reports being fatigued, but agreeable to therapy session. Pt requiring mod-max +2 for moving to/from EOB, and tolerated EOB sitting x10 minutes while working on LE exercise and attempted standing. Pt requiring max +2 to attempt stand, only able to clear buttocks before pt too dizzy to continue. PT instructed pt on LE exercises to perform as HEP, Pt and wife express understanding.    Recommendations for follow up therapy are one component of a multi-disciplinary discharge planning process, led by the attending physician.  Recommendations may be updated based on patient status, additional functional criteria and insurance authorization.  Follow Up Recommendations  CIR     Equipment Recommendations  Other (comment) (tba)    Recommendations for Other Services Rehab consult     Precautions / Restrictions Precautions Precautions: Fall Precaution Comments: trach collar, cortrak, watch BP HR Restrictions Weight Bearing Restrictions: No     Mobility  Bed Mobility Overal bed mobility: Needs Assistance Bed Mobility: Rolling;Sidelying to Sit Rolling: +2 for physical assistance;Mod assist Sidelying to sit: Mod assist;+2 for physical assistance   Sit to supine: Max assist;+2 for physical assistance   General bed mobility comments: mod +2 for log roll to side, truncal elevation off of bed. Cues for hand placement and sequencing task. max +2 assist for return to supine for LE lifting and trunk lowering, boost up in  bed.    Transfers Overall transfer level: Needs assistance Equipment used:  (handles on back of recliner, +2 assist) Transfers: Sit to/from Stand Sit to Stand: Mod assist;+2 physical assistance;From elevated surface         General transfer comment: max +2 attempt from EOB, pt able to clear buttocks only before needing to sit due to dizziness and fatigue.  Ambulation/Gait                 Stairs             Wheelchair Mobility    Modified Rankin (Stroke Patients Only) Modified Rankin (Stroke Patients Only) Pre-Morbid Rankin Score: No symptoms Modified Rankin: Severe disability     Balance Overall balance assessment: Needs assistance;History of Falls Sitting-balance support: Feet supported;Bilateral upper extremity supported Sitting balance-Leahy Scale: Poor Sitting balance - Comments: EOB sitting x10 minutes, occasional truncal assist needed from PT     Standing balance-Leahy Scale: Zero                              Cognition Arousal/Alertness: Lethargic Behavior During Therapy: Flat affect Overall Cognitive Status: Impaired/Different from baseline Area of Impairment: Attention;Memory;Safety/judgement;Awareness;Problem solving               Rancho Levels of Cognitive Functioning Rancho Los Amigos Scales of Cognitive Functioning: Confused/appropriate Orientation Level: Disoriented to;Place;Time Current Attention Level: Sustained Memory: Decreased short-term memory Following Commands: Follows one step commands consistently Safety/Judgement: Decreased awareness of safety;Decreased awareness of deficits Awareness: Emergent Problem Solving: Slow processing General Comments: Pt telling jokes throughout session, requires step-by-step cuing.  Exercises General Exercises - Lower Extremity Quad Sets: AAROM;Both;5 reps;Supine Long Arc Quad: AAROM;Right;5 reps;Supine    General Comments General comments (skin integrity, edema, etc.):  BP 110s/60s sitting EOB, transient dizziness      Pertinent Vitals/Pain Pain Assessment: Faces Faces Pain Scale: Hurts a little bit Pain Location: generalized Pain Descriptors / Indicators: Grimacing Pain Intervention(s): Limited activity within patient's tolerance;Monitored during session;Repositioned    Home Living                      Prior Function            PT Goals (current goals can now be found in the care plan section) Acute Rehab PT Goals Patient Stated Goal: to get stronger PT Goal Formulation: With patient Time For Goal Achievement: 02/21/21 Potential to Achieve Goals: Fair Progress towards PT goals: Progressing toward goals    Frequency    Min 3X/week      PT Plan Current plan remains appropriate    Co-evaluation              AM-PAC PT "6 Clicks" Mobility   Outcome Measure  Help needed turning from your back to your side while in a flat bed without using bedrails?: A Lot Help needed moving from lying on your back to sitting on the side of a flat bed without using bedrails?: A Lot Help needed moving to and from a bed to a chair (including a wheelchair)?: Total Help needed standing up from a chair using your arms (e.g., wheelchair or bedside chair)?: Total Help needed to walk in hospital room?: Total Help needed climbing 3-5 steps with a railing? : Total 6 Click Score: 8    End of Session Equipment Utilized During Treatment: Gait belt;Other (comment) (trach collar) Activity Tolerance: Patient tolerated treatment well;Patient limited by fatigue Patient left: with call bell/phone within reach;with family/visitor present;in bed;with bed alarm set Nurse Communication: Mobility status PT Visit Diagnosis: Other abnormalities of gait and mobility (R26.89);Muscle weakness (generalized) (M62.81);Other symptoms and signs involving the nervous system (R29.898)     Time: 1660-6004 PT Time Calculation (min) (ACUTE ONLY): 30 min  Charges:   $Therapeutic Activity: 23-37 mins                    Stacie Glaze, PT DPT Acute Rehabilitation Services Pager [phone]  Office [phone]    [place] Ruffin Pyo 02/18/2021, 3:55 PM

## 2021-02-19 DIAGNOSIS — J9601 Acute respiratory failure with hypoxia: Secondary | ICD-10-CM | POA: Diagnosis not present

## 2021-02-19 DIAGNOSIS — I2699 Other pulmonary embolism without acute cor pulmonale: Secondary | ICD-10-CM | POA: Diagnosis not present

## 2021-02-19 DIAGNOSIS — Z20822 Contact with and (suspected) exposure to covid-19: Secondary | ICD-10-CM | POA: Diagnosis not present

## 2021-02-19 DIAGNOSIS — S066X9A Traumatic subarachnoid hemorrhage with loss of consciousness of unspecified duration, initial encounter: Secondary | ICD-10-CM | POA: Diagnosis not present

## 2021-02-19 LAB — RENAL FUNCTION PANEL
Albumin: 2.3 g/dL — ABNORMAL LOW (ref 3.5–5.0)
Anion gap: 9 (ref 5–15)
BUN: 24 mg/dL — ABNORMAL HIGH (ref 8–23)
CO2: 23 mmol/L (ref 22–32)
Calcium: 8.9 mg/dL (ref 8.9–10.3)
Chloride: 103 mmol/L (ref 98–111)
Creatinine, Ser: 1.11 mg/dL (ref 0.61–1.24)
GFR, Estimated: >60 mL/min (ref >60)
Glucose, Bld: 100 mg/dL — ABNORMAL HIGH (ref 70–99)
Phosphorus: 3.4 mg/dL (ref 2.5–4.6)
Potassium: 3.6 mmol/L (ref 3.5–5.1)
Sodium: 135 mmol/L (ref 135–145)

## 2021-02-19 LAB — GLUCOSE, CAPILLARY
Glucose-Capillary: 98 mg/dL (ref 70–99)
Glucose-Capillary: 142 mg/dL — ABNORMAL HIGH (ref 70–99)
Glucose-Capillary: 147 mg/dL — ABNORMAL HIGH (ref 70–99)
Glucose-Capillary: 152 mg/dL — ABNORMAL HIGH (ref 70–99)

## 2021-02-19 LAB — MAGNESIUM: Magnesium: 1.7 mg/dL (ref 1.7–2.4)

## 2021-02-19 NOTE — Progress Notes (Signed)
Inpatient Rehab Admissions Coordinator:    I withdrew Pt.'s case for CIR from insurance due to concerns that Pt. Is not tolerating OOB well. I will resubmit once tolerance improves. I spoke with Pt. And he states that he wants to consider going home with [facility]. I explained that this is likely not an option due to his trach and the fact that he is Max+2 with transfers.  Pt. Insistent on speaking with TOC about whether going home is an option. Case manager notified.   Clemens Catholic, [place], [place] Admissions Coordinator  [phone] ([place]) [phone] (office)

## 2021-02-19 NOTE — Progress Notes (Signed)
Nutrition Follow-up  DOCUMENTATION CODES:   Obesity unspecified  INTERVENTION:   - Continue Nepro Shake po TID, each supplement provides 425 kcal and 19 grams protein  - Encourage PO intake  NUTRITION DIAGNOSIS:   Inadequate oral intake related to inability to eat as evidenced by NPO status.  Progressing, pt now on Carb Modified diet  GOAL:   Patient will meet greater than or equal to 90% of their needs  Progressing  MONITOR:   PO intake, Supplement acceptance, Labs, Weight trends, I & O's  REASON FOR ASSESSMENT:   Consult, Ventilator Enteral/tube feeding initiation and management  ASSESSMENT:   Pt with PMH of DM and HTN admitted after falling down basement stairs with TBI/SAH/SDH, significant frontal lobe injuries, occipital bone fx, temporal bone fx extending into middle ear, and R TM rupture.  8/15 - s/p Cortrak placement (tip gastric) 8/25 - pt vomited, OG placed with 650 ml out, abd tight  8/26 - extubated but required re-intubation 2 hours later, Cortrak coiled in pt's mouth post extubation and removed, NG tube placed with tip in distal stomach  8/27 - TF resumed 8/29 - s/p trach placement  8/31 - Cortrak placed (tip gastric) 9/01 - CRRT started 9/13 - CRRT stopped 9/14 - first iHD 9/19 - tunneled catheter placed for long-term iHD 9/21 - FEES, diet advanced to dysphagia 2 with thin liquid 9/22 - transitioned to nocturnal TF 9/27 - nocturnal TF d/c, Cortrak removed 9/28 - diet advanced to Carb Modified  Per notes, pt failed capping trial again yesterday. Pt may d/c to CIR vs SNF.  Spoke with pt and wife at bedside. Pt reports that appetite is back to baseline. Wife reports pt completed 100% of breakfast meal for the last 3 days and at least 50% of other meals she has observed. Pt enjoys the protein shake supplements (Nepro) and drinks 2-3 of these daily. RD to continue order.  Admit weight: 133.8 kg Current weight: 111.1 kg  Per nursing documentation,  pt with non-pitting edema to BUE and BLE.   Meal Completion: 0-100% x last 8 documented meals (averaging 61%)  Medications reviewed and include: vitamin C 500 mg BID, colace, Nepro shake TID, ferrous sulfate, SSI, semglee 16 units BID, protonix, miralax, senna  Labs reviewed: BUN 24 CBG's: 98-198 x 24 hours  Diet Order:   Diet Order             Diet Carb Modified Fluid consistency: Thin; Room service appropriate? Yes  Diet effective now                   EDUCATION NEEDS:   Education needs have been addressed  Skin:  Skin Assessment: Reviewed RN Assessment  Last BM:  02/18/21 type 7  Height:   Ht Readings from Last 1 Encounters:  01/26/21 6\' 2"  (1.88 m)    Weight:   Wt Readings from Last 1 Encounters:  02/19/21 111.1 kg    BMI:  Body mass index is 31.45 kg/m.  Estimated Nutritional Needs:   Kcal:  2500-2700  Protein:  130-150 grams  Fluid:  > 2 L/day    Gustavus Bryant, MS, RD, LDN Inpatient Clinical Dietitian Please see AMiON for contact information.

## 2021-02-19 NOTE — Progress Notes (Signed)
Physical Therapy Treatment Patient Details Name: Angel [name] MRN: [id number] DOB: 29-Aug-1950 Today's Date: 02/19/2021   History of Present Illness Angel [name] is a 70 y.o. 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 CT showed R temporal and parietal SAH, SAH anterior frontal lobes  bilaterally. Also sustained right occipital skull fracture, temporal bone fx, right TM rupture, bilateral PE. Intubated 8/12, trach'd 8/29. CRRT 9/1- 9/9.  PMH: DM2, HTN    PT Comments     Pt eager to get OOB today. Pt requiring less physical assist for bed mobility today, min-mod assist for truncal management. Pt tolerated standing x1 minute in sara plus device today, no complaints of dizziness with BP 131/95 once up in recliner. PT encouraged pt to stay up for 1-2 hours, RN aware. PT to continue to follow and will progress mobility as able.    Recommendations for follow up therapy are one component of a multi-disciplinary discharge planning process, led by the attending physician.  Recommendations may be updated based on patient status, additional functional criteria and insurance authorization.  Follow Up Recommendations  CIR     Equipment Recommendations  Other (comment) (tba)    Recommendations for Other Services Rehab consult     Precautions / Restrictions Precautions Precautions: Fall Precaution Comments: trach collar, watch BP HR Restrictions Weight Bearing Restrictions: No     Mobility  Bed Mobility Overal bed mobility: Needs Assistance Bed Mobility: Rolling;Sidelying to Sit Rolling: Min assist   Supine to sit: Mod assist;HOB elevated     General bed mobility comments: min-mod assist for roll to L side via HHA and trunk elevation off of bed. Increased time and effort, cues for sequencing.    Transfers Overall transfer level: Needs assistance Equipment used: Ambulation equipment used Transfers: Sit to/from Stand Sit to Stand: Max assist         General transfer  comment: max assist for power up and rise with use of sara plus, pt depressing shoulders into arm rests and maintaining stand x1 minute.  Ambulation/Gait             General Gait Details: nt   Stairs             Wheelchair Mobility    Modified Rankin (Stroke Patients Only)       Balance Overall balance assessment: Needs assistance;History of Falls Sitting-balance support: Feet supported;Bilateral upper extremity supported Sitting balance-Leahy Scale: Fair Sitting balance - Comments: no support sitting EOB x5 minutes without PT assist     Standing balance-Leahy Scale: Zero                              Cognition Arousal/Alertness: Lethargic Behavior During Therapy: Flat affect Overall Cognitive Status: Impaired/Different from baseline Area of Impairment: Attention;Memory;Safety/judgement;Awareness;Problem solving               Rancho Levels of Cognitive Functioning Rancho Los Amigos Scales of Cognitive Functioning: Confused/appropriate   Current Attention Level: Sustained Memory: Decreased short-term memory Following Commands: Follows one step commands consistently Safety/Judgement: Decreased awareness of safety;Decreased awareness of deficits Awareness: Emergent Problem Solving: Slow processing;Requires verbal cues;Requires tactile cues General Comments: motivated to progress mobility, requires step-by-step cuing      Exercises General Exercises - Lower Extremity Long Arc Quad: AAROM;Both;10 reps;Seated    General Comments        Pertinent Vitals/Pain Pain Assessment: Faces Faces Pain Scale: Hurts little more Pain Location: buttocks Pain  Descriptors / Indicators: Grimacing Pain Intervention(s): Monitored during session;Limited activity within patient's tolerance;Repositioned    Home Living                      Prior Function            PT Goals (current goals can now be found in the care plan section) Acute Rehab  PT Goals Patient Stated Goal: to get stronger PT Goal Formulation: With patient Time For Goal Achievement: 02/21/21 Potential to Achieve Goals: Fair Progress towards PT goals: Progressing toward goals    Frequency    Min 3X/week      PT Plan Current plan remains appropriate    Co-evaluation              AM-PAC PT "6 Clicks" Mobility   Outcome Measure  Help needed turning from your back to your side while in a flat bed without using bedrails?: A Lot Help needed moving from lying on your back to sitting on the side of a flat bed without using bedrails?: A Lot Help needed moving to and from a bed to a chair (including a wheelchair)?: A Lot Help needed standing up from a chair using your arms (e.g., wheelchair or bedside chair)?: Total Help needed to walk in hospital room?: Total Help needed climbing 3-5 steps with a railing? : Total 6 Click Score: 9    End of Session Equipment Utilized During Treatment: Other (comment) (trach collar) Activity Tolerance: Patient tolerated treatment well Patient left: with call bell/phone within reach;with family/visitor present;in chair;with chair alarm set Nurse Communication: Mobility status PT Visit Diagnosis: Other abnormalities of gait and mobility (R26.89);Muscle weakness (generalized) (M62.81);Other symptoms and signs involving the nervous system (R29.898)     Time: 5102-5852 PT Time Calculation (min) (ACUTE ONLY): 28 min  Charges:  $Therapeutic Activity: 8-22 mins $Neuromuscular Re-education: 8-22 mins                     Stacie Glaze, PT DPT Acute Rehabilitation Services Pager [phone]  Office [phone]    [place] Ruffin Pyo 02/19/2021, 2:53 PM

## 2021-02-19 NOTE — Progress Notes (Signed)
Patient ID: Angel [name], male   DOB: [birth year], 70 y.o.   MRN: [id number]  [facility] Surgery Progress Note  36 Days Post-Op  Subjective: CC-  Only complaint this morning is that his butt is sore. Tolerating diet. BM yesterday. Failed trach cap trial yesterday.  Objective: Vital signs in last 24 hours: Temp:  [98.1 F (36.7 C)-99.3 F (37.4 C)] 98.1 F (36.7 C) (10/04 0756) Pulse Rate:  [57-69] 68 (10/04 0847) Resp:  [13-20] 16 (10/04 0847) BP: (101-119)/(62-72) 114/62 (10/04 0847) SpO2:  [96 %-99 %] 99 % (10/04 0847) FiO2 (%):  [21 %] 21 % (10/04 0847) Weight:  [111.1 kg] 111.1 kg (10/04 0500) Last BM Date: 02/18/21  Intake/Output from previous day: 10/03 0701 - 10/04 0700 In: -  Out: 450 [Urine:450] Intake/Output this shift: Total I/O In: 240 [P.O.:240] Out: 200 [Urine:200]  PE: Gen:  Alert, NAD HEENT: EOM's intact, pupils equal and round. Trach in place with PMV Card:  RRR, no M/G/R heard, palpable pedal pulses Pulm:  CTAB, no W/R/R, rate and effort normal Abd: Soft, protuberant, nontender, +BS Ext:  calves soft and nontender Neuro: MAEs, follows commands Skin: no rashes noted, warm and dry  Lab Results:  Recent Labs    02/17/21 1106  WBC 5.4  HGB 7.9*  HCT 24.9*  PLT 259   BMET Recent Labs    02/18/21 0158 02/19/21 0152  NA 134* 135  K 4.1 3.6  CL 105 103  CO2 21* 23  GLUCOSE 121* 100*  BUN 26* 24*  CREATININE 1.13 1.11  CALCIUM 8.9 8.9   PT/INR No results for input(s): LABPROT, INR in the last 72 hours. CMP     Component Value Date/Time   NA 135 02/19/2021 0152   K 3.6 02/19/2021 0152   CL 103 02/19/2021 0152   CO2 23 02/19/2021 0152   GLUCOSE 100 (H) 02/19/2021 0152   BUN 24 (H) 02/19/2021 0152   CREATININE 1.11 02/19/2021 0152   CALCIUM 8.9 02/19/2021 0152   PROT 5.7 (L) 01/10/2021 1346   ALBUMIN 2.3 (L) 02/19/2021 0152   AST 30 01/10/2021 1346   ALT 34 01/10/2021 1346   ALKPHOS 53 01/10/2021 1346   BILITOT 0.4  01/10/2021 1346   GFRNONAA >60 02/19/2021 0152   Lipase  No results found for: LIPASE     Studies/Results: DG CHEST PORT 1 VIEW  Result Date: 02/17/2021 CLINICAL DATA:  Cough.  Evaluate for pneumothorax. EXAM: PORTABLE CHEST 1 VIEW COMPARISON:  February 04, 2021 FINDINGS: Stable tracheostomy tube. The Vas-Cath is been removed. No pneumothorax. Stable cardiomegaly. The hila and mediastinum are unremarkable. No pulmonary nodules, masses, or focal infiltrates. IMPRESSION: Support apparatus as above.  No acute abnormalities noted. Electronically Signed   By: Dorise Bullion III M.D.   On: 02/17/2021 13:10    Anti-infectives: Anti-infectives (From admission, onward)    Start     Dose/Rate Route Frequency Ordered Stop   02/04/21 1548  ceFAZolin (ANCEF) IVPB 2g/100 mL premix  Status:  Discontinued        over 30 Minutes  Continuous PRN 02/04/21 1549 02/09/21 1305   02/04/21 1545  ceFAZolin (ANCEF) IVPB 1 g/50 mL premix        1 g 100 mL/hr over 30 Minutes Intravenous  Once 02/04/21 1458 02/04/21 1600   02/04/21 1543  ceFAZolin (ANCEF) 2-4 GM/100ML-% IVPB       Note to Pharmacy: [place]   : cabinet override      02/04/21 1543  02/04/21 1651   01/11/21 2330  ceFEPIme (MAXIPIME) 2 g in sodium chloride 0.9 % 100 mL IVPB  Status:  Discontinued        2 g 200 mL/hr over 30 Minutes Intravenous Every 24 hours 01/11/21 0711 01/16/21 0907   01/10/21 1645  ampicillin (OMNIPEN) 2 g in sodium chloride 0.9 % 100 mL IVPB  Status:  Discontinued        2 g 300 mL/hr over 20 Minutes Intravenous Every 8 hours 01/10/21 1549 01/16/21 0907   01/09/21 2200  ceFEPIme (MAXIPIME) 2 g in sodium chloride 0.9 % 100 mL IVPB  Status:  Discontinued        2 g 200 mL/hr over 30 Minutes Intravenous Every 12 hours 01/09/21 1458 01/11/21 0711   01/08/21 1515  metroNIDAZOLE (FLAGYL) IVPB 500 mg  Status:  Discontinued        500 mg 100 mL/hr over 60 Minutes Intravenous Every 8 hours 01/08/21 1428 01/10/21 1618    01/03/21 0600  vancomycin (VANCOREADY) IVPB 1250 mg/250 mL  Status:  Discontinued        1,250 mg 166.7 mL/hr over 90 Minutes Intravenous Every 12 hours 01/02/21 1717 01/03/21 0837   01/02/21 1800  vancomycin (VANCOREADY) IVPB 2000 mg/400 mL        2,000 mg 200 mL/hr over 120 Minutes Intravenous  Once 01/02/21 1712 01/02/21 2007   01/02/21 0900  ceFEPIme (MAXIPIME) 2 g in sodium chloride 0.9 % 100 mL IVPB  Status:  Discontinued        2 g 200 mL/hr over 30 Minutes Intravenous Every 8 hours 01/02/21 0849 01/09/21 1458        Assessment/Plan Fall down stairs 8/12 VDRF - guaifenisen, S/P trach 8/29 by Dr. Bobbye Morton. Has tolerated HTC well, passy muir trials. Trach changed to cuffless #6 on 9/20. There is no XLT #4 cuffless. Failed capping trial 9/28 and 9/30 and 10/4. Continue PMV today TBI/SAH/SDH - NSGY c/s, Dr. Annette Stable. Significant frontal lobe injuries. Keppra x7d for sz ppx (completed) Occipital bone fx - NSGY c/s, Dr. Annette Stable Temporal bone fx extending into middle ear - ENT c/s, Dr. Constance Holster, no acute treatment, will need re-eval hearing and facial nerve  Right TM Rupture - ENT c/s, Dr. Constance Holster [facility] -  cardiology s/o 9/23 with recs: metoprolol 25mg  BID, amiodarone 200 mg BID x1 week, then decrease to 200 mg daily. Decreased Metoprolol 12.5mg  BID on 10/3 due to hypotension ABL anemia- 1u PRBC 9/19, hgb stable at 7.9 (10/2). Continue Iron and vitamin c Bilateral pulmonary embolism - Eliquis AKI - Resolved. GFR > 60. Nephrology reports no further need for dialysis. Tunneled catheter out Hx DM2 - SSI. Decreased semglee to 16U BID 9/28. Appreciate DM coordinator assistance  Hx HTN - PRN meds FEN - Reg diet VTE - SCDs, Eliquis ID - off abx, no fevers Dispo - No longer needs LTACH. CIR following.  Patient is medically stable for discharge once dispo arranged.   LOS: 53 days    Wellington [place], [facility] Surgery 02/19/2021, 10:02 AM Please see Amion for pager number during day hours  7:00am-4:30pm

## 2021-02-19 NOTE — Progress Notes (Signed)
Speech Language Pathology Treatment: Dysphagia;Cognitive-Linquistic;Passy Muir Speaking valve  Patient Details Name: Angel [name] MRN: [id number] DOB: April 18, 1951 Today's Date: 02/19/2021 Time: 4098-1191 SLP Time Calculation (min) (ACUTE ONLY): 30 min  Assessment / Plan / Recommendation Clinical Impression  Pt was seen for therapy targeting PMV tolerance, skilled observation of diet tolerance, and cognitive functions (functional calculations, problem solving). Pt wears speaking valve during all waking hours; SLP noted no change in sats throughout session (HR 65, SpO2 100, RR 16). SLP doffed PMV x1 noting no air trapping. Speech intelligibility at 100% across all utterance lengths. SLP observed pt with thin liquid, pills, and regular solids. Pt with intermittent cough at baseline; SLP observed cough throughout meal but no overt s/s of oral or pharyngeal phase impairments. Recommend continue with regular/thin diet; administer medications whole with thin liquid. All swallowing goals met. SLP will d/c from swallowing.   Cognitively, pt continues to improve each session. This date, pt oriented x4 and able to describe events occurring in the future. SLP administered money counting/spending task. Pt initially counted money inaccurately when bills were variegated but successfully counted bills after SLP sorted them by type. To accurately count coins, pt required SLP sorting and step-by-step guidance/verbal cues. When asked to gather $76, pt did so independently and was able to report how much money was left given a visual cue. Pt reported pain and indicated he would need to lay down. SLP will continue to follow for cognitive therapy and education re: PMV donning and doffing.   HPI HPI: Angel [name] is a 70 y.o. 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 CT showed R temporal and parietal SAH, SAH anterior frontal lobes  bilaterally. Also sustained right occipital skull fracture, temporal bone fx, right TM  rupture, bilateral PE. Intubated 8/12, trach'd 8/29. PMH: DM2, HTN      SLP Plan  Goals updated      Recommendations for follow up therapy are one component of a multi-disciplinary discharge planning process, led by the attending physician.  Recommendations may be updated based on patient status, additional functional criteria and insurance authorization.    Recommendations  Diet recommendations: Regular;Thin liquid Liquids provided via: Cup;Straw Medication Administration: Whole meds with liquid Supervision: Intermittent supervision to cue for compensatory strategies Compensations: Minimize environmental distractions;Clear throat intermittently Postural Changes and/or Swallow Maneuvers: Seated upright 90 degrees      Patient may use Passy-Muir Speech Valve: During all waking hours (remove during sleep) PMSV Supervision: Intermittent         General recommendations: Rehab consult Oral Care Recommendations: Oral care BID Follow up Recommendations: Inpatient Rehab SLP Visit Diagnosis: Dysphagia, unspecified (R13.10);Aphonia (R49.1);Cognitive communication deficit (R41.841) Plan: Goals updated       [place], SLP-Student    Dewitt Rota  02/19/2021, 11:44 AM

## 2021-02-19 NOTE — Progress Notes (Signed)
Inpatient Rehab Admissions Coordinator:   Pt. With better tolerance for therapy today, I resubmitted Pt.'s case to insurance for auth. I called and left a voicemail with Pt.'s wife in attempt to update her.   Clemens Catholic, [place], [place] Admissions Coordinator  [phone] ([place]) [phone] (office)

## 2021-02-20 DIAGNOSIS — I2699 Other pulmonary embolism without acute cor pulmonale: Secondary | ICD-10-CM | POA: Diagnosis not present

## 2021-02-20 DIAGNOSIS — J9601 Acute respiratory failure with hypoxia: Secondary | ICD-10-CM | POA: Diagnosis not present

## 2021-02-20 DIAGNOSIS — Z20822 Contact with and (suspected) exposure to covid-19: Secondary | ICD-10-CM | POA: Diagnosis not present

## 2021-02-20 DIAGNOSIS — S066X9A Traumatic subarachnoid hemorrhage with loss of consciousness of unspecified duration, initial encounter: Secondary | ICD-10-CM | POA: Diagnosis not present

## 2021-02-20 LAB — RENAL FUNCTION PANEL
Albumin: 2.3 g/dL — ABNORMAL LOW (ref 3.5–5.0)
Anion gap: 8 (ref 5–15)
BUN: 20 mg/dL (ref 8–23)
CO2: 23 mmol/L (ref 22–32)
Calcium: 8.9 mg/dL (ref 8.9–10.3)
Chloride: 104 mmol/L (ref 98–111)
Creatinine, Ser: 1.16 mg/dL (ref 0.61–1.24)
GFR, Estimated: >60 mL/min (ref >60)
Glucose, Bld: 121 mg/dL — ABNORMAL HIGH (ref 70–99)
Phosphorus: 3.4 mg/dL (ref 2.5–4.6)
Potassium: 3.3 mmol/L — ABNORMAL LOW (ref 3.5–5.1)
Sodium: 135 mmol/L (ref 135–145)

## 2021-02-20 LAB — GLUCOSE, CAPILLARY
Glucose-Capillary: 101 mg/dL — ABNORMAL HIGH (ref 70–99)
Glucose-Capillary: 129 mg/dL — ABNORMAL HIGH (ref 70–99)
Glucose-Capillary: 139 mg/dL — ABNORMAL HIGH (ref 70–99)
Glucose-Capillary: 154 mg/dL — ABNORMAL HIGH (ref 70–99)
Glucose-Capillary: 172 mg/dL — ABNORMAL HIGH (ref 70–99)
Glucose-Capillary: 178 mg/dL — ABNORMAL HIGH (ref 70–99)

## 2021-02-20 LAB — MAGNESIUM: Magnesium: 1.7 mg/dL (ref 1.7–2.4)

## 2021-02-20 NOTE — Progress Notes (Signed)
Occupational Therapy Treatment Patient Details Name: Angel [name] MRN: [id number] DOB: 02-Jun-1950 Today's Date: 02/20/2021   History of present illness Angel [name] is a 70 y.o. 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 CT showed R temporal and parietal SAH, SAH anterior frontal lobes  bilaterally. Also sustained right occipital skull fracture, temporal bone fx, right TM rupture, bilateral PE. Intubated 8/12, trach'd 8/29. CRRT 9/1- 9/9.  PMH: DM2, HTN   OT comments  Excellent session during cotreat with PT. Pt initially coughing and complaining of discomfort. PMSV removed and pt coughed up mucous plug via trach with significant relief. Mod A for bed mobility and mod A +2 with sit - stand using Stedy. Able to stand from paddles with min A of 1. Pt interactive, making jokes and singing/dancing to music used during session. Pt with improved endurance and strength and is an excellent candidate for CIR to maximize independence to facilitate safe DC home with supportive family.  VSS on TC throughout session   Recommendations for follow up therapy are one component of a multi-disciplinary discharge planning process, led by the attending physician.  Recommendations may be updated based on patient status, additional functional criteria and insurance authorization.    Follow Up Recommendations  CIR    Equipment Recommendations  3 in 1 bedside commode;Wheelchair (measurements OT);Wheelchair cushion (measurements OT);Hospital bed    Recommendations for Other Services Rehab consult    Precautions / Restrictions Precautions Precautions: Fall Precaution Comments: trach collar, watch BP HR       Mobility Bed Mobility Overal bed mobility: Needs Assistance     Sidelying to sit: Mod assist       General bed mobility comments: HOB elevated; pt able to move B legs off bed; assist to transition hips and complete transition of trunk upright    Transfers Overall transfer level: Needs  assistance   Transfers: Sit to/from Stand Sit to Stand: Mod assist;+2 physical assistance         General transfer comment: several sit - stand while in Steady with min A form paddles    Balance     Sitting balance-Leahy Scale: Good       Standing balance-Leahy Scale: Poor                             ADL either performed or assessed with clinical judgement   ADL Overall ADL's : Needs assistance/impaired Eating/Feeding: Minimal assistance Eating/Feeding Details (indicate cue type and reason): tremor making self feeding difficult Grooming: Minimal assistance   Upper Body Bathing: Moderate assistance;Sitting   Lower Body Bathing: Maximal assistance;Sit to/from stand;Sitting/lateral leans   Upper Body Dressing : Moderate assistance;Sitting   Lower Body Dressing: Total assistance       Toileting- Clothing Manipulation and Hygiene: Total assistance       Functional mobility during ADLs: Moderate assistance;+2 for physical assistance       Vision       Perception     Praxis      Cognition Arousal/Alertness: Awake/alert Behavior During Therapy: WFL for tasks assessed/performed                   [place] Levels of Cognitive Functioning [place] [employer] Scales of Cognitive Functioning: Purposeful/appropriate               General Comments: interactive; joking; requesting songs to be played during  session.; will further assess  Exercises Exercises: Other exercises General Exercises - Upper Extremity Shoulder Flexion: 20 reps;Strengthening;Both;Supine Other Exercises Other Exercises: sit - stand in [place] Other Exercises: sitting upright in stedy, releasing with 1 hand; alernating hands - only tolerated 3 reps   Shoulder Instructions       General Comments VSS throughout session    Pertinent Vitals/ Pain       Pain Assessment: Faces Faces Pain Scale: Hurts little more Pain Location: chest/discomfort from coughing Pain  Descriptors / Indicators: Grimacing;Discomfort Pain Intervention(s): Limited activity within patient's tolerance;Repositioned  Home Living                                          Prior Functioning/Environment              Frequency  Min 2X/week        Progress Toward Goals  OT Goals(current goals can now be found in the care plan section)  Progress towards OT goals: Progressing toward goals  Acute Rehab OT Goals Patient Stated Goal: to get stronger OT Goal Formulation: With patient/family Time For Goal Achievement: 02/26/21 Potential to Achieve Goals: Good ADL Goals Pt Will Perform Eating: with set-up;with supervision;with adaptive utensils;sitting Pt Will Perform Grooming: with set-up;with supervision;sitting;with adaptive equipment Pt Will Perform Upper Body Bathing: with set-up;with supervision;sitting Pt Will Perform Lower Body Bathing: with mod assist;with adaptive equipment;bed level Pt Will Transfer to Toilet: with +2 assist;bedside commode;with mod assist Pt Will Perform Toileting - Clothing Manipulation and hygiene: with mod assist;sitting/lateral leans;with adaptive equipment Additional ADL Goal #1: pt will follow 2 step comamnds 50% of session Additional ADL Goal #2: pt will visually locate 2 adl items 50% of request Additional ADL Goal #3: pt will static sit eob mod (A) for 10 minutes with stable VSS  Plan Discharge plan remains appropriate    Co-evaluation    PT/OT/SLP Co-Evaluation/Treatment: Yes Reason for Co-Treatment: Complexity of the patient's impairments (multi-system involvement);For patient/therapist safety;To address functional/ADL transfers          AM-PAC OT "6 Clicks" Daily Activity     Outcome Measure   Help from another person eating meals?: A Little Help from another person taking care of personal grooming?: A Little Help from another person toileting, which includes using toliet, bedpan, or urinal?: Total Help  from another person bathing (including washing, rinsing, drying)?: A Lot Help from another person to put on and taking off regular upper body clothing?: A Lot Help from another person to put on and taking off regular lower body clothing?: Total 6 Click Score: 12    End of Session Equipment Utilized During Treatment: Oxygen (5L; FiO2 28%)  OT Visit Diagnosis: Unsteadiness on feet (R26.81);Other abnormalities of gait and mobility (R26.89);Muscle weakness (generalized) (M62.81);Other symptoms and signs involving cognitive function;Pain Pain - part of body:  (chest/butt)   Activity Tolerance Patient tolerated treatment well   Patient Left in chair;with call bell/phone within reach;with chair alarm set;with family/visitor present   Nurse Communication Mobility status;Need for lift equipment        Time: 1110-1150 OT Time Calculation (min): 40 min  Charges: OT General Charges $OT Visit: 1 Visit OT Treatments $Self Care/Home Management : 8-22 mins  Maurie Boettcher, OT/L   Acute OT Clinical Specialist [place] Pager [phone] Office [phone]   [facility] 02/20/2021, 1:36 PM

## 2021-02-20 NOTE — Procedures (Addendum)
Tracheostomy Change Note  Patient Details:   Name: DEMONT LINFORD DOB: [birth year] MRN: [id number]    Airway Documentation:   Changed to # 4 cuffless shiley, done by myself and another ICU RT (Megan), per order  Evaluation  O2 sats: stable throughout Complications: No apparent complications Patient did tolerate procedure well. Bilateral Breath Sounds: Diminished, Clear + easy cap color change s/p trach change.  No distress noted.  RN ordered inner cannulas.      Lenna Sciara 02/20/2021, 3:33 PM

## 2021-02-20 NOTE — Progress Notes (Signed)
Inpatient Rehab Admissions Coordinator:   I do not have a CIR bed for this Pt. Today. Request for insurance auth re-sent yesterday afternoon, anticipate a response today or tomorrow. I updated pt. And his wife. I will continue to follow and pursue for potential admit pending insurance auth and bed availability.  Clemens Catholic, [place], [place] Admissions Coordinator  [phone] ([place]) [phone] (office)

## 2021-02-20 NOTE — Progress Notes (Signed)
Trauma/Critical Care Follow Up Note  Subjective:    Overnight Issues:   Objective:  Vital signs for last 24 hours: Temp:  [98.6 F (37 C)-98.9 F (37.2 C)] 98.9 F (37.2 C) (10/05 0324) Pulse Rate:  [61-70] 67 (10/05 1131) Resp:  [16-19] 16 (10/05 1131) BP: (103-146)/(63-75) 105/66 (10/05 1131) SpO2:  [97 %-100 %] 100 % (10/05 1131) FiO2 (%):  [21 %] 21 % (10/05 1131) Weight:  [110.7 kg] 110.7 kg (10/05 0621)  Hemodynamic parameters for last 24 hours:    Intake/Output from previous day: 10/04 0701 - 10/05 0700 In: 480 [P.O.:480] Out: 200 [Urine:200]  Intake/Output this shift: Total I/O In: 480 [P.O.:480] Out: -   Vent settings for last 24 hours: FiO2 (%):  [21 %] 21 %  Physical Exam:  Gen: comfortable, no distress Neuro: non-focal exam HEENT: PERRL Neck: supple CV: RRR Pulm: unlabored breathing on TC Abd: soft, NT GU: clear yellow urine Extr: wwp, no edema   Results for orders placed or performed during the hospital encounter of 12/28/20 (from the past 24 hour(s))  Glucose, capillary     Status: Abnormal   Collection Time: 02/19/21  5:01 PM  Result Value Ref Range   Glucose-Capillary 147 (H) 70 - 99 mg/dL  Glucose, capillary     Status: Abnormal   Collection Time: 02/19/21  7:40 PM  Result Value Ref Range   Glucose-Capillary 152 (H) 70 - 99 mg/dL  Glucose, capillary     Status: Abnormal   Collection Time: 02/20/21 12:21 AM  Result Value Ref Range   Glucose-Capillary 178 (H) 70 - 99 mg/dL  Magnesium     Status: None   Collection Time: 02/20/21  3:19 AM  Result Value Ref Range   Magnesium 1.7 1.7 - 2.4 mg/dL  Renal function panel     Status: Abnormal   Collection Time: 02/20/21  3:19 AM  Result Value Ref Range   Sodium 135 135 - 145 mmol/L   Potassium 3.3 (L) 3.5 - 5.1 mmol/L   Chloride 104 98 - 111 mmol/L   CO2 23 22 - 32 mmol/L   Glucose, Bld 121 (H) 70 - 99 mg/dL   BUN 20 8 - 23 mg/dL   Creatinine, Ser 1.16 0.61 - 1.24 mg/dL   Calcium 8.9  8.9 - 10.3 mg/dL   Phosphorus 3.4 2.5 - 4.6 mg/dL   Albumin 2.3 (L) 3.5 - 5.0 g/dL   GFR, Estimated >60 >60 mL/min   Anion gap 8 5 - 15  Glucose, capillary     Status: Abnormal   Collection Time: 02/20/21  3:23 AM  Result Value Ref Range   Glucose-Capillary 129 (H) 70 - 99 mg/dL  Glucose, capillary     Status: Abnormal   Collection Time: 02/20/21  8:02 AM  Result Value Ref Range   Glucose-Capillary 101 (H) 70 - 99 mg/dL  Glucose, capillary     Status: Abnormal   Collection Time: 02/20/21 11:55 AM  Result Value Ref Range   Glucose-Capillary 139 (H) 70 - 99 mg/dL    Assessment & Plan:  Present on Admission: **None**    LOS: 54 days   Additional comments:I reviewed the patient's new clinical lab test results.   and I reviewed the patients new imaging test results.    Fall down stairs 8/12 VDRF - guaifenisen, S/P trach 8/29 by Dr. Bobbye Morton. Has tolerated HTC well, passy muir trials. Trach changed to cuffless #6 on 9/20. There is no XLT #4 cuffless, so will  trail standard 4CL today. Failed capping trial 9/28 and 9/30 and 10/4. Continue PMV today TBI/SAH/SDH - NSGY c/s, Dr. Annette Stable. Significant frontal lobe injuries. Keppra x7d for sz ppx (completed) Occipital bone fx - NSGY c/s, Dr. Annette Stable Temporal bone fx extending into middle ear - ENT c/s, Dr. Constance Holster, no acute treatment, will need re-eval hearing and facial nerve  Right TM Rupture - ENT c/s, Dr. Constance Holster [facility] -  cardiology s/o 9/23 with recs: metoprolol 25mg  BID, amiodarone 200 mg BID x1 week, then decrease to 200 mg daily. Decreased Metoprolol 12.5mg  BID on 10/3 due to hypotension, orthostasis has resolved. ABL anemia- 1u PRBC 9/19, hgb stable. Continue Iron and vitamin c Bilateral pulmonary embolism - Eliquis AKI - Resolved. GFR > 60. Nephrology reports no further need for dialysis. Tunneled catheter out Hx DM2 - SSI. Decreased semglee to 16U BID 9/28. Appreciate DM coordinator assistance  Hx HTN - PRN meds FEN - Reg diet VTE - SCDs,  Eliquis ID - off abx, no fevers Dispo - CIR following, insurance auth pending. Patient is medically stable for discharge once dispo arranged.  Jesusita Oka, MD Trauma & General Surgery Please use [email or web address] to contact on call provider  02/20/2021  *Care during the described time interval was provided by me. I have reviewed this patient's available data, including medical history, events of note, physical examination and test results as part of my evaluation.

## 2021-02-20 NOTE — Progress Notes (Deleted)
Cardiology Office Note    Date:  02/20/2021   ID:  Angel [name] Sep 16, 1950, MRN [id number]   PCP:  Street, Sharon Mt, MD   Uncertain  Cardiologist:  Werner Lean, MD   Advanced Practice Provider:  No care team member to display Electrophysiologist:  None   [id number]}   No chief complaint on file.   History of Present Illness:  Angel [name] is a 70 y.o. male with a PMH of hyperlipidemia, DM type II,   Patient admitted with fall resulting in subarachnoid hemorrhage requiring intubation with hospital course complicated by Multi lobar pneumonia, PE, sepsis, AKI requiring CRRT, and new onset atrial fibrillation/flutter and bilateral pulmonary embolism. Started on Eliquis. Hgb 6.7 1 unit PRBCs. Was in/out afib in the hospital and started on amiodarone, QT prolongation most likely secondary hypokalemia but need to be monitored.    Past Medical History:  Diagnosis Date   DM (diabetes mellitus) ([place])    HLD (hyperlipidemia)    Hypertension     Past Surgical History:  Procedure Laterality Date   IR FLUORO GUIDE CV LINE RIGHT  02/04/2021   IR REMOVAL TUN CV CATH W/O FL  02/14/2021   IR US GUIDE VASC ACCESS RIGHT  02/04/2021   TRACHEOSTOMY TUBE PLACEMENT N/A 01/14/2021   Procedure: TRACHEOSTOMY;  Surgeon: Jesusita Oka, MD;  Location: [facility];  Service: General;  Laterality: N/A;    Current Medications: No outpatient medications have been marked as taking for the 03/06/21 encounter (Appointment) with Imogene Burn, PA-C.     Allergies:   Patient has no known allergies.   Social History   Socioeconomic History   Marital status: Married    Spouse name: Not on file   Number of children: Not on file   Years of education: Not on file   Highest education level: Not on file  Occupational History   Not on file  Tobacco Use   Smoking status: Never   Smokeless tobacco: Never  Vaping Use   Vaping Use: Every day  Substance and Sexual  Activity   Alcohol use: Not on file   Drug use: Not on file   Sexual activity: Not on file  Other Topics Concern   Not on file  Social History Narrative   Not on file   Social Determinants of Health   Financial Resource Strain: Not on file  Food Insecurity: Not on file  Transportation Needs: Not on file  Physical Activity: Not on file  Stress: Not on file  Social Connections: Not on file     Family History:  The patient's ***family history is not on file.   ROS:   Please see the history of present illness.    ROS All other systems reviewed and are negative.   PHYSICAL EXAM:   VS:  There were no vitals taken for this visit.  Physical Exam  GEN: Well nourished, well developed, in no acute distress  HEENT: normal  Neck: no JVD, carotid bruits, or masses Cardiac:RRR; no murmurs, rubs, or gallops  Respiratory:  clear to auscultation bilaterally, normal work of breathing GI: soft, nontender, nondistended, + BS Ext: without cyanosis, clubbing, or edema, Good distal pulses bilaterally MS: no deformity or atrophy  Skin: warm and dry, no rash Neuro:  Alert and Oriented x 3, Strength and sensation are intact Psych: euthymic mood, full affect  Wt Readings from Last 3 Encounters:  02/20/21 244 lb (110.7 kg)  Studies/Labs Reviewed:   EKG:  EKG is*** ordered today.  The ekg ordered today demonstrates ***  Recent Labs: 01/10/2021: ALT 34 02/17/2021: Hemoglobin 7.9; Platelets 259 02/20/2021: BUN 20; Creatinine, Ser 1.16; Magnesium 1.7; Potassium 3.3; Sodium 135   Lipid Panel    Component Value Date/Time   TRIG 145 01/22/2021 0500    AdEcho 01/07/21: 1. Left ventricular ejection fraction, by estimation, is 60 to 65%. The  left ventricle has normal function. The left ventricle has no regional  wall motion abnormalities. There is mild left ventricular hypertrophy.  Left ventricular diastolic parameters  are indeterminate.   2. Right ventricule is poorly visualized but  grossly normal size and  systolic function   3. Left atrial size was mildly dilated.   4. Right atrial size was mildly dilated.   5. The mitral valve is normal in structure. No evidence of mitral valve  regurgitation. No evidence of mitral stenosis.   6. The aortic valve was not well visualized. Aortic valve regurgitation  is not visualized. No aortic stenosis is present.  ditional studies/ records that were reviewed today include:      Risk Assessment/Calculations:   {Does this patient have ATRIAL FIBRILLATION?:[id number]}     ASSESSMENT:    No diagnosis found.   PLAN:  In order of problems listed above:  PAF on Eliquis, amiodarone  Bilateral PE and multilobar PNA  Subarachnoid hemorrhage  AKI  HTN  QT prolongation  Shared Decision Making/Informed Consent   {Are you ordering a CV Procedure (e.g. stress test, cath, DCCV, TEE, etc)?   Press F2        :[id number]}    Medication Adjustments/Labs and Tests Ordered: Current medicines are reviewed at length with the patient today.  Concerns regarding medicines are outlined above.  Medication changes, Labs and Tests ordered today are listed in the Patient Instructions below. There are no Patient Instructions on file for this visit.   Signed, Ermalinda Barrios, PA-C  02/20/2021 3:27 PM    [facility] [address], [place], [place]  [address] Phone: [phone]; Fax: [phone]

## 2021-02-20 NOTE — Progress Notes (Signed)
Physical Therapy Treatment Patient Details Name: Angel [name] MRN: [id number] DOB: [birth year] Today's Date: 02/20/2021   History of Present Illness Angel [name] is a 70 y.o. 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 CT showed R temporal and parietal SAH, SAH anterior frontal lobes  bilaterally. Also sustained right occipital skull fracture, temporal bone fx, right TM rupture, bilateral PE. Intubated 8/12, trach'd 8/29. CRRT 9/1- 9/9.  PMH: DM2, HTN    PT Comments    Pt demonstrating great progress in activity tolerance today, tolerating repeated sit<>stands using stedy and stood x15 seconds with UE support only. Pt overall requiring mod assist +2 for mobility at this time, VSS during session with no complaints of dizziness throughout session. Pt remains an excellent CIR candidate.     Recommendations for follow up therapy are one component of a multi-disciplinary discharge planning process, led by the attending physician.  Recommendations may be updated based on patient status, additional functional criteria and insurance authorization.  Follow Up Recommendations  CIR     Equipment Recommendations  Other (comment) (tba)    Recommendations for Other Services Rehab consult     Precautions / Restrictions Precautions Precautions: Fall Precaution Comments: trach collar, watch BP HR Restrictions Weight Bearing Restrictions: No     Mobility  Bed Mobility Overal bed mobility: Needs Assistance   Rolling: Min assist Sidelying to sit: Mod assist       General bed mobility comments: HOB elevated; pt able to move B legs off bed; assist to transition hips and complete transition of trunk upright    Transfers Overall transfer level: Needs assistance   Transfers: Sit to/from Stand Sit to Stand: Mod assist;+2 physical assistance         General transfer comment: mod +2 for initial power up, rise, and steadying. STS x3 from stedy paddles, verbal cuing for sequencing task  and hand placement  Ambulation/Gait                 Stairs             Wheelchair Mobility    Modified Rankin (Stroke Patients Only)       Balance Overall balance assessment: Needs assistance;History of Falls Sitting-balance support: Feet supported;Bilateral upper extremity supported Sitting balance-Leahy Scale: Fair Sitting balance - Comments: EOB sitting x5 minutes, no physical assist to support     Standing balance-Leahy Scale: Poor Standing balance comment: tolerates standing in stedy unsupported by seat x15 seconds                            Cognition Arousal/Alertness: Awake/alert Behavior During Therapy: WFL for tasks assessed/performed Overall Cognitive Status: Impaired/Different from baseline Area of Impairment: Attention;Following commands;Safety/judgement;Problem solving               Rancho Levels of Cognitive Functioning Rancho Los Amigos Scales of Cognitive Functioning: Purposeful/appropriate   Current Attention Level: Selective   Following Commands: Follows one step commands consistently Safety/Judgement: Decreased awareness of safety;Decreased awareness of deficits   Problem Solving: Requires verbal cues;Requires tactile cues General Comments: interactive; joking; requesting songs to be played during  session. Pt with sarcastic sense of humor      Exercises General Exercises - Upper Extremity Shoulder Flexion: 20 reps;Strengthening;Both;Supine Other Exercises Other Exercises: sit - stand in Stedy x3, standing tolerance Other Exercises: sitting upright in stedy, releasing with 1 hand; alernating hands - only tolerated 3 reps    General Comments  General comments (skin integrity, edema, etc.): vss      Pertinent Vitals/Pain Pain Assessment: Faces Faces Pain Scale: Hurts little more Pain Location: chest/discomfort from coughing Pain Descriptors / Indicators: Grimacing;Discomfort Pain Intervention(s): Limited activity  within patient's tolerance;Monitored during session;Repositioned    Home Living                      Prior Function            PT Goals (current goals can now be found in the care plan section) Acute Rehab PT Goals Patient Stated Goal: to get stronger PT Goal Formulation: With patient Time For Goal Achievement: 02/21/21 Potential to Achieve Goals: Fair Progress towards PT goals: Progressing toward goals    Frequency    Min 3X/week      PT Plan Current plan remains appropriate    Co-evaluation   Reason for Co-Treatment: Complexity of the patient's impairments (multi-system involvement);For patient/therapist safety;To address functional/ADL transfers          AM-PAC PT "6 Clicks" Mobility   Outcome Measure  Help needed turning from your back to your side while in a flat bed without using bedrails?: A Little Help needed moving from lying on your back to sitting on the side of a flat bed without using bedrails?: A Lot Help needed moving to and from a bed to a chair (including a wheelchair)?: A Lot Help needed standing up from a chair using your arms (e.g., wheelchair or bedside chair)?: A Lot Help needed to walk in hospital room?: Total Help needed climbing 3-5 steps with a railing? : Total 6 Click Score: 11    End of Session Equipment Utilized During Treatment: Other (comment) (trach collar) Activity Tolerance: Patient tolerated treatment well Patient left: with call bell/phone within reach;with family/visitor present;in chair;with chair alarm set Nurse Communication: Mobility status PT Visit Diagnosis: Other abnormalities of gait and mobility (R26.89);Muscle weakness (generalized) (M62.81);Other symptoms and signs involving the nervous system (R29.898)     Time: 5093-2671 PT Time Calculation (min) (ACUTE ONLY): 28 min  Charges:  $Therapeutic Activity: 8-22 mins                     Stacie Glaze, PT DPT Acute Rehabilitation Services Pager [phone]   Office [phone]    [place] E Stroup 02/20/2021, 2:10 PM

## 2021-02-21 LAB — RENAL FUNCTION PANEL
Albumin: 2.2 g/dL — ABNORMAL LOW (ref 3.5–5.0)
Anion gap: 9 (ref 5–15)
BUN: 17 mg/dL (ref 8–23)
CO2: 22 mmol/L (ref 22–32)
Calcium: 8.9 mg/dL (ref 8.9–10.3)
Chloride: 104 mmol/L (ref 98–111)
Creatinine, Ser: 1.17 mg/dL (ref 0.61–1.24)
GFR, Estimated: >60 mL/min (ref >60)
Glucose, Bld: 92 mg/dL (ref 70–99)
Phosphorus: 3.9 mg/dL (ref 2.5–4.6)
Potassium: 3.5 mmol/L (ref 3.5–5.1)
Sodium: 135 mmol/L (ref 135–145)

## 2021-02-21 LAB — GLUCOSE, CAPILLARY
Glucose-Capillary: 100 mg/dL — ABNORMAL HIGH (ref 70–99)
Glucose-Capillary: 81 mg/dL (ref 70–99)
Glucose-Capillary: 151 mg/dL — ABNORMAL HIGH (ref 70–99)
Glucose-Capillary: 135 mg/dL — ABNORMAL HIGH (ref 70–99)
Glucose-Capillary: 129 mg/dL — ABNORMAL HIGH (ref 70–99)

## 2021-02-21 LAB — MAGNESIUM: Magnesium: 1.8 mg/dL (ref 1.7–2.4)

## 2021-02-21 NOTE — Progress Notes (Signed)
Patient ID: Angel [name], male   DOB: 03/19/1951, 70 y.o.   MRN: [id number]  [facility] Surgery Progress Note  38 Days Post-Op  Subjective: CC-  Wife at bedside. No new complaints. Trach downsized to #4 yesterday and tolerated well. Tolerating diet.  Objective: Vital signs in last 24 hours: Temp:  [97.8 F (36.6 C)-98.7 F (37.1 C)] 98.6 F (37 C) (10/06 0749) Pulse Rate:  [56-67] 64 (10/06 0749) Resp:  [12-20] 15 (10/06 0749) BP: (103-133)/(66-76) 131/70 (10/06 0749) SpO2:  [96 %-100 %] 97 % (10/06 0749) FiO2 (%):  [21 %-28 %] 28 % (10/06 0342) Weight:  [112.5 kg] 112.5 kg (10/06 0554) Last BM Date: 02/18/21  Intake/Output from previous day: 10/05 0701 - 10/06 0700 In: 840 [P.O.:840] Out: -  Intake/Output this shift: No intake/output data recorded.  PE: Gen:  Alert, NAD HEENT: EOM's intact, pupils equal and round. Trach in place  Card:  RRR, no M/G/R heard, palpable pedal pulses Pulm:  CTAB, no W/R/R, rate and effort normal Abd: Soft, protuberant, nontender, +BS Ext:  calves soft and nontender Neuro: MAEs, follows commands Skin: no rashes noted, warm and dry  Lab Results:  No results for input(s): WBC, HGB, HCT, PLT in the last 72 hours. BMET Recent Labs    02/20/21 0319 02/21/21 0434  NA 135 135  K 3.3* 3.5  CL 104 104  CO2 23 22  GLUCOSE 121* 92  BUN 20 17  CREATININE 1.16 1.17  CALCIUM 8.9 8.9   PT/INR No results for input(s): LABPROT, INR in the last 72 hours. CMP     Component Value Date/Time   NA 135 02/21/2021 0434   K 3.5 02/21/2021 0434   CL 104 02/21/2021 0434   CO2 22 02/21/2021 0434   GLUCOSE 92 02/21/2021 0434   BUN 17 02/21/2021 0434   CREATININE 1.17 02/21/2021 0434   CALCIUM 8.9 02/21/2021 0434   PROT 5.7 (L) 01/10/2021 1346   ALBUMIN 2.2 (L) 02/21/2021 0434   AST 30 01/10/2021 1346   ALT 34 01/10/2021 1346   ALKPHOS 53 01/10/2021 1346   BILITOT 0.4 01/10/2021 1346   GFRNONAA >60 02/21/2021 0434   Lipase  No results  found for: LIPASE     Studies/Results: No results found.  Anti-infectives: Anti-infectives (From admission, onward)    Start     Dose/Rate Route Frequency Ordered Stop   02/04/21 1548  ceFAZolin (ANCEF) IVPB 2g/100 mL premix  Status:  Discontinued        over 30 Minutes  Continuous PRN 02/04/21 1549 02/09/21 1305   02/04/21 1545  ceFAZolin (ANCEF) IVPB 1 g/50 mL premix        1 g 100 mL/hr over 30 Minutes Intravenous  Once 02/04/21 1458 02/04/21 1600   02/04/21 1543  ceFAZolin (ANCEF) 2-4 GM/100ML-% IVPB       Note to Pharmacy: [place]   : cabinet override      02/04/21 1543 02/04/21 1651   01/11/21 2330  ceFEPIme (MAXIPIME) 2 g in sodium chloride 0.9 % 100 mL IVPB  Status:  Discontinued        2 g 200 mL/hr over 30 Minutes Intravenous Every 24 hours 01/11/21 0711 01/16/21 0907   01/10/21 1645  ampicillin (OMNIPEN) 2 g in sodium chloride 0.9 % 100 mL IVPB  Status:  Discontinued        2 g 300 mL/hr over 20 Minutes Intravenous Every 8 hours 01/10/21 1549 01/16/21 0907   01/09/21 2200  ceFEPIme (  MAXIPIME) 2 g in sodium chloride 0.9 % 100 mL IVPB  Status:  Discontinued        2 g 200 mL/hr over 30 Minutes Intravenous Every 12 hours 01/09/21 1458 01/11/21 0711   01/08/21 1515  metroNIDAZOLE (FLAGYL) IVPB 500 mg  Status:  Discontinued        500 mg 100 mL/hr over 60 Minutes Intravenous Every 8 hours 01/08/21 1428 01/10/21 1618   01/03/21 0600  vancomycin (VANCOREADY) IVPB 1250 mg/250 mL  Status:  Discontinued        1,250 mg 166.7 mL/hr over 90 Minutes Intravenous Every 12 hours 01/02/21 1717 01/03/21 0837   01/02/21 1800  vancomycin (VANCOREADY) IVPB 2000 mg/400 mL        2,000 mg 200 mL/hr over 120 Minutes Intravenous  Once 01/02/21 1712 01/02/21 2007   01/02/21 0900  ceFEPIme (MAXIPIME) 2 g in sodium chloride 0.9 % 100 mL IVPB  Status:  Discontinued        2 g 200 mL/hr over 30 Minutes Intravenous Every 8 hours 01/02/21 0849 01/09/21 1458         Assessment/Plan Fall down stairs 8/12 VDRF - guaifenisen, S/P trach 8/29 by Dr. Bobbye Morton. Has tolerated HTC well, passy muir trials. Trach changed to cuffless #6 on 9/20. There is no XLT #4 cuffless, so downsized to standard #4CL 10/5. Failed capping trial 9/28 and 9/30 and 10/4. Continue PMV today TBI/SAH/SDH - NSGY c/s, Dr. Annette Stable. Significant frontal lobe injuries. Keppra x7d for sz ppx (completed) Occipital bone fx - NSGY c/s, Dr. Annette Stable Temporal bone fx extending into middle ear - ENT c/s, Dr. Constance Holster, no acute treatment, will need re-eval hearing and facial nerve  Right TM Rupture - ENT c/s, Dr. Constance Holster [facility] -  cardiology s/o 9/23 with recs: metoprolol 25mg  BID, amiodarone 200 mg BID x1 week, then decrease to 200 mg daily. Decreased Metoprolol 12.5mg  BID on 10/3 due to hypotension, orthostasis has resolved. ABL anemia- 1u PRBC 9/19, hgb stable. Continue Iron and vitamin c Bilateral pulmonary embolism - Eliquis AKI - Resolved. GFR > 60. Nephrology reports no further need for dialysis. Tunneled catheter out Hx DM2 - SSI. Decreased semglee to 16U BID 9/28. Appreciate DM coordinator assistance  Hx HTN - PRN meds FEN - Reg diet VTE - SCDs, Eliquis ID - off abx, no fevers Dispo - CIR following, insurance auth pending. Patient is medically stable for discharge once dispo arranged.   LOS: 55 days    Wellington Hampshire, [facility] Surgery 02/21/2021, 8:36 AM Please see Amion for pager number during day hours 7:00am-4:30pm

## 2021-02-21 NOTE — TOC Progression Note (Signed)
Transition of Care [facility]) - Progression Note    Patient Details  Name: Angel [name] MRN: [id number] Date of Birth: Jan 18, 1951  Transition of Care [facility]) CM/SW Contact  Oren Section Cleta Alberts, RN Phone Number: 02/21/2021, 12:00pm  Clinical Narrative:    Patient denied by insurance for inpatient rehab.  Planning Peer to Peer appeal.  Discussed with patient's wife; she is disappointed, hopeful to overturn.  She is aware that she/patient will have to consider SNF if CIR unable to be approved by insurance.    Expected Discharge Plan: IP Rehab Facility Barriers to Discharge: Continued Medical Work up  Expected Discharge Plan and Services Expected Discharge Plan: [place]   Discharge Planning Services: CM Consult   Living arrangements for the past 2 months: Single Family Home                                       Social Determinants of Health (SDOH) Interventions    Readmission Risk Interventions No flowsheet data found.  Reinaldo Raddle, RN, BSN  Trauma/Neuro ICU Case Manager [phone]

## 2021-02-21 NOTE — Progress Notes (Signed)
IP rehab admissions - Insurance carrier has denied CIR saying that patient needs more SNF level rehab.  If a peer to peer is desired, please call Dr. Amalia Hailey with [employer] at [phone] before 10 am tomorrow, 02/22/21.  Call me for questions.  [phone]

## 2021-02-21 NOTE — Progress Notes (Signed)
Speech Language Pathology Treatment: Cognitive-Linquistic;Passy Muir Speaking valve  Patient Details Name: Angel [name] MRN: [id number] DOB: November 04, 1950 Today's Date: 02/21/2021 Time: 6063-0160 SLP Time Calculation (min) (ACUTE ONLY): 23 min  Assessment / Plan / Recommendation Clinical Impression  Pt was seen for therapy targeting higher level attention and problem solving. SLP donned speaking valve, noting no changes in sats (HR 65, SpO2 100, RR 19). Pt shows marked improvement from previous targeted session in the areas of working memory, calculations, and attention. Pt oriented x4 and anticipatory awareness/safety and judgement largely intact. Pt described impairments and assistance needed to get OOB. SLP and pt completed several card games requiring activation of working memory, critical thinking, and attention. Pt completed all tasks independently. When given a verbal math problem requiring higher level attention, pt provided inaccurate answer and required one verbal cue to correct. SLP provided education re: benefits of speech therapy. SLP will continue to follow for therapy targeting higher level cognitive functions and education re: donning/doffing PMV independently.    HPI HPI: Angel [name] is a 70 y.o. 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 CT showed R temporal and parietal SAH, SAH anterior frontal lobes  bilaterally. Also sustained right occipital skull fracture, temporal bone fx, right TM rupture, bilateral PE. Intubated 8/12, trach'd 8/29. PMH: DM2, HTN      SLP Plan  Continue with current plan of care      Recommendations for follow up therapy are one component of a multi-disciplinary discharge planning process, led by the attending physician.  Recommendations may be updated based on patient status, additional functional criteria and insurance authorization.    Recommendations         Patient may use Passy-Muir Speech Valve: During all waking hours (remove during  sleep) PMSV Supervision: Intermittent         General recommendations: Rehab consult Oral Care Recommendations: Oral care BID Follow up Recommendations: Inpatient Rehab SLP Visit Diagnosis: Cognitive communication deficit (R41.841);Aphonia (R49.1) Plan: Continue with current plan of care       [place], SLP-Student   Dewitt Rota  02/21/2021, 4:03 PM

## 2021-02-22 DIAGNOSIS — J9601 Acute respiratory failure with hypoxia: Secondary | ICD-10-CM | POA: Diagnosis not present

## 2021-02-22 DIAGNOSIS — S066X9A Traumatic subarachnoid hemorrhage with loss of consciousness of unspecified duration, initial encounter: Secondary | ICD-10-CM | POA: Diagnosis not present

## 2021-02-22 DIAGNOSIS — Z20822 Contact with and (suspected) exposure to covid-19: Secondary | ICD-10-CM | POA: Diagnosis not present

## 2021-02-22 DIAGNOSIS — I2699 Other pulmonary embolism without acute cor pulmonale: Secondary | ICD-10-CM | POA: Diagnosis not present

## 2021-02-22 LAB — GLUCOSE, CAPILLARY
Glucose-Capillary: 93 mg/dL (ref 70–99)
Glucose-Capillary: 133 mg/dL — ABNORMAL HIGH (ref 70–99)
Glucose-Capillary: 151 mg/dL — ABNORMAL HIGH (ref 70–99)
Glucose-Capillary: 200 mg/dL — ABNORMAL HIGH (ref 70–99)
Glucose-Capillary: 141 mg/dL — ABNORMAL HIGH (ref 70–99)

## 2021-02-22 MED ORDER — ALPRAZOLAM 0.25 MG PO TABS
0.2500 mg | ORAL_TABLET | Freq: Two times a day (BID) | ORAL | Status: DC | PRN
  Administered 2021-02-22 – 2021-02-27 (×6): 0.25 mg via ORAL
  Filled 2021-02-22 (×9): qty 1

## 2021-02-22 NOTE — Progress Notes (Signed)
Physical Therapy Treatment Patient Details Name: Angel [name] MRN: [id number] DOB: 09-Dec-1950 Today's Date: 02/22/2021   History of Present Illness Angel [name] is a 70 y.o. 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 CT showed R temporal and parietal SAH, SAH anterior frontal lobes  bilaterally. Also sustained right occipital skull fracture, temporal bone fx, right TM rupture, bilateral PE. Intubated 8/12, trach'd 8/29. CRRT 9/1- 9/9.  PMH: DM2, HTN    PT Comments    Pt participated in over an hour of PT/OT co session working on functional strength and transfers.  He did have BP drops from supine to sitting (could not stand long enough to get a standing one).  He reported progressive fatigue and lightheadedness.  He was tired, but continued to participate in therapy throughout the session, taking a supine rest break before final transfer OOB to the chair.  He remains appropriate for CIR level therapies and I issued him an HEP to practice TID (wife aware) in the bed.   BPs as follows: 117/66 (last supine BP taken)                           105/52 (seated initially)                           95/67 (seated after ~ 5 mins)                            98/80 (supine rest break)                            108/68 -seated at end of session in chair                                Recommendations for follow up therapy are one component of a multi-disciplinary discharge planning process, led by the attending physician.  Recommendations may be updated based on patient status, additional functional criteria and insurance authorization.  Follow Up Recommendations  CIR     Equipment Recommendations  Wheelchair (measurements PT);Wheelchair cushion (measurements PT);3in1 (PT);Rolling walker with 5" wheels (wide RW, BSC, 20x20 WC)    Recommendations for Other Services       Precautions / Restrictions Precautions Precautions: Fall Precaution Comments: trach collar, watch BP  HR Restrictions Weight Bearing Restrictions: No     Mobility  Bed Mobility Overal bed mobility: Needs Assistance Bed Mobility: Rolling;Sidelying to Sit;Sit to Sidelying Rolling: Min assist;Mod assist Sidelying to sit: Mod assist     Sit to sidelying: Min guard General bed mobility comments: Pt got up OOB twice during our session, cues to sequenc reaching for left rail, coming up on left elbow, assist needed mostly at trunk to push up to sitting EOB, used momentum to come back to sidelying.    Transfers Overall transfer level: Needs assistance Equipment used: Ambulation equipment used Transfers: Sit to/from [facility] Sit to Stand: Mod assist;+2 physical assistance;From elevated surface Stand pivot transfers: +2 physical assistance;From elevated surface (seated on standing frame for transfer)       General transfer comment: Mod assist to come to standing x 3, twice EOB leaning against locked recliner chair back, once in stedy standing frame. Knees blocked by therapists when standing with recliner chair.  Multiple attempts needed for first successful stand.  Ambulation/Gait             General Gait Details: Pt was able to preform pre gait of stepping in place   Stairs             Wheelchair Mobility    Modified Rankin (Stroke Patients Only)       Balance Overall balance assessment: Needs assistance Sitting-balance support: Feet supported;Bilateral upper extremity supported Sitting balance-Leahy Scale: Fair Sitting balance - Comments: close supervision EOB, reporting lighheadedness upon sitting.  BPs dropped from BPs recorded in supine.   Standing balance support: Bilateral upper extremity supported Standing balance-Leahy Scale: Poor Standing balance comment: two person mod assist in standing.  knees blocked and able to stand multiple times, but < 1 min each time.  Pt reporting feeling progressively weaker, BPs slowly dropping.                             Cognition Arousal/Alertness: Awake/alert Behavior During Therapy: WFL for tasks assessed/performed Overall Cognitive Status: Impaired/Different from baseline Area of Impairment: Problem solving;Attention;Following commands;Awareness;Safety/judgement               Rancho Levels of Cognitive Functioning Rancho Los Amigos Scales of Cognitive Functioning: Automatic/appropriate   Current Attention Level: Selective Memory: Decreased short-term memory Following Commands: Follows one step commands with increased time Safety/Judgement: Decreased awareness of safety Awareness: Emergent Problem Solving: Difficulty sequencing;Requires verbal cues;Requires tactile cues General Comments: interactive; joking; Pt with sarcastic sense of humor      Exercises General Exercises - Lower Extremity Ankle Circles/Pumps: AROM;Both;20 reps Heel Slides: AAROM;Both;10 reps Hip ABduction/ADduction: AAROM;Both;10 reps Straight Leg Raises: AAROM;Both;10 reps    General Comments        Pertinent Vitals/Pain Pain Assessment: Faces Faces Pain Scale: Hurts even more Pain Location: right hand intermittently Pain Descriptors / Indicators: Grimacing;Guarding;Sharp;Shooting Pain Intervention(s): Limited activity within patient's tolerance;Monitored during session    Home Living                      Prior Function            PT Goals (current goals can now be found in the care plan section) Acute Rehab PT Goals PT Goal Formulation: With patient/family Time For Goal Achievement: 03/08/21 Potential to Achieve Goals: Good Progress towards PT goals: Progressing toward goals    Frequency    Min 3X/week      PT Plan Current plan remains appropriate    Co-evaluation PT/OT/SLP Co-Evaluation/Treatment: Yes Reason for Co-Treatment: For patient/therapist safety;To address functional/ADL transfers PT goals addressed during session: Mobility/safety with  mobility;Balance;Strengthening/ROM OT goals addressed during session: Strengthening/ROM;ADL's and self-care      AM-PAC PT "6 Clicks" Mobility   Outcome Measure  Help needed turning from your back to your side while in a flat bed without using bedrails?: A Little Help needed moving from lying on your back to sitting on the side of a flat bed without using bedrails?: A Lot Help needed moving to and from a bed to a chair (including a wheelchair)?: A Lot Help needed standing up from a chair using your arms (e.g., wheelchair or bedside chair)?: A Lot Help needed to walk in hospital room?: Total Help needed climbing 3-5 steps with a railing? : Total 6 Click Score: 11    End of Session Equipment Utilized During Treatment: Gait belt Activity Tolerance: Patient limited by fatigue Patient  left: in chair;with call bell/phone within reach;with chair alarm set;with family/visitor present   PT Visit Diagnosis: Other abnormalities of gait and mobility (R26.89);Muscle weakness (generalized) (M62.81);Other symptoms and signs involving the nervous system (R29.898)     Time: 0109-3235 PT Time Calculation (min) (ACUTE ONLY): 66 min  Charges:  $Therapeutic Exercise: 8-22 mins $Therapeutic Activity: 8-22 mins                     Verdene Lennert, PT, DPT  Acute Rehabilitation Ortho Tech Supervisor [phone] pager [phone] office

## 2021-02-22 NOTE — Progress Notes (Signed)
Inpatient Rehab Admissions Coordinator:   Faxed expedited appeal to [employer].  Will follow for determination.   Shann Medal, PT, DPT Admissions Coordinator [phone] 02/22/21  3:00 PM

## 2021-02-22 NOTE — Progress Notes (Signed)
Occupational Therapy Treatment Patient Details Name: Angel [name] MRN: [id number] DOB: November 11, 1950 Today's Date: 02/22/2021   History of present illness Angel [name] is a 70 y.o. 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 CT showed R temporal and parietal SAH, SAH anterior frontal lobes  bilaterally. Also sustained right occipital skull fracture, temporal bone fx, right TM rupture, bilateral PE. Intubated 8/12, trach'd 8/29. CRRT 9/1- 9/9.  PMH: DM2, HTN   OT comments  [name] admitted with above presents to acute OT with focus today of trying to stand without Angel [name] and he was able to for one trial but then felt more weak so BP was checked and BP was dropping. He was able to stand x 2 more trials (after resting) with sara stedy (Mod A +2 from elevated bed, Min A +2 from sara stedy seat). He was able to wash his face while seated in recliner without A. He is complaining of intermittent shooting/sharp pain in his right hand. He will continue to benefit from acute OT with follow up on CIR.   Recommendations for follow up therapy are one component of a multi-disciplinary discharge planning process, led by the attending physician.  Recommendations may be updated based on patient status, additional functional criteria and insurance authorization.    Follow Up Recommendations  CIR;Supervision/Assistance - 24 hour    Equipment Recommendations  3 in 1 bedside commode;Wheelchair (measurements OT);Wheelchair cushion (measurements OT);Hospital bed    Recommendations for Other Services      Precautions / Restrictions Precautions Precautions: Fall Precaution Comments: trach collar, watch BP HR Restrictions Weight Bearing Restrictions: No       Mobility Bed Mobility Overal bed mobility: Needs Assistance Bed Mobility: Rolling;Sidelying to Sit;Sit to Sidelying Rolling: Min guard (VCs for sequencing) Sidelying to sit: Mod assist;HOB elevated     Sit to sidelying: Min  guard General bed mobility comments: Supine>sit: HOB elevated; pt able to move B legs off bed; assist to transition hips and complete transition of trunk upright    Transfers Overall transfer level: Needs assistance Equipment used: Ambulation equipment used Transfers: Sit to/from Stand Sit to Stand: Mod assist;+2 physical assistance;From elevated surface Stand pivot transfers: +2 physical assistance;From elevated surface (seated on standing frame for transfer)       General transfer comment: Pt reported he felt weak once up to EOB (BP:105/52). able to stand with +2 Mod A from elevated surface (holding onto back of recliner) and maintain standing for ~1 minute and trying to do small marches in place. Once seated he again reported he felt really weak (BP 95/67), Pt felt like he needed to lay down which we agreed to with BP in laying down (BP 98/80). Pt rested ~5 minutes than he sat up again on EOB with Mod A, sit>stand with Clarise Cruz stedy with Mod A +2 and elevated bed, over to chair with si>stand from stedy with Min A +2. BP sitting in recliner (108/68).    Balance Overall balance assessment: Needs assistance Sitting-balance support: No upper extremity supported;Feet supported Sitting balance-Leahy Scale: Fair Sitting balance - Comments: min guard A sitting EOB, reporting feeling weak.   Standing balance support: Bilateral upper extremity supported Standing balance-Leahy Scale: Poor Standing balance comment: +2 Mod assist in standing.                           ADL either performed or assessed with clinical judgement   ADL Overall  ADL's : Needs assistance/impaired Eating/Feeding: Set up;Supervision/ safety;Bed level Eating/Feeding Details (indicate cue type and reason): reports not having to use red built up handles today Grooming: Set up;Supervision/safety;Wash/dry face;Sitting Grooming Details (indicate cue type and reason): in Customer service manager Transfer:  Moderate assistance;+2 for physical assistance Toilet Transfer Details (indicate cue type and reason): use of Angel [name]; simulated from bed(raised) to recliner Toileting- Clothing Manipulation and Hygiene: Total assistance Toileting - Clothing Manipulation Details (indicate cue type and reason): Min A to maintain standing balance in [place] Patient Visual Report: No change from baseline            Cognition Arousal/Alertness: Awake/alert Behavior During Therapy: WFL for tasks assessed/performed Overall Cognitive Status: Impaired/Different from baseline Area of Impairment: Problem solving;Attention;Following commands;Awareness;Safety/judgement               Rancho Levels of Cognitive Functioning Rancho Los Amigos Scales of Cognitive Functioning: Automatic/appropriate   Current Attention Level: Selective Memory: Decreased short-term memory Following Commands: Follows one step commands with increased time Safety/Judgement: Decreased awareness of safety Awareness: Emergent Problem Solving: Difficulty sequencing;Requires verbal cues;Requires tactile cues General Comments: interactive; joking; Pt with sarcastic sense of humor                   Pertinent Vitals/ Pain       Pain Assessment: Faces Faces Pain Scale: Hurts even more Pain Location: right hand intermittently Pain Descriptors / Indicators: Grimacing;Guarding;Sharp;Shooting Pain Intervention(s): Limited activity within patient's tolerance;Monitored during session         Frequency  Min 2X/week        Progress Toward Goals  OT Goals(current goals can now be found in the care plan section)  Progress towards OT goals: Progressing toward goals  Acute Rehab OT Goals Patient Stated Goal: to get stronger, go to rehab and go home OT Goal Formulation: With patient/family Time For Goal Achievement: 02/26/21 Potential to Achieve Goals: Good  Plan Discharge plan remains appropriate     Co-evaluation    PT/OT/SLP Co-Evaluation/Treatment: Yes (partial) Reason for Co-Treatment: For patient/therapist safety;To address functional/ADL transfers PT goals addressed during session: Mobility/safety with mobility;Balance;Strengthening/ROM OT goals addressed during session: Strengthening/ROM;ADL's and self-care      AM-PAC OT "6 Clicks" Daily Activity     Outcome Measure   Help from another person eating meals?: A Little (reports not having to use red built up handles today) Help from another person taking care of personal grooming?: A Little Help from another person toileting, which includes using toliet, bedpan, or urinal?: Total Help from another person bathing (including washing, rinsing, drying)?: A Lot Help from another person to put on and taking off regular upper body clothing?: A Lot Help from another person to put on and taking off regular lower body clothing?: Total 6 Click Score: 9    End of Session Equipment Utilized During Treatment:  (5 liters 28% trach collar, off trach collar during therapy session and reapplied at end of session (no O2 sat issues))  OT Visit Diagnosis: Unsteadiness on feet (R26.81);Other abnormalities of gait and mobility (R26.89);Muscle weakness (generalized) (M62.81);Other symptoms and signs involving cognitive function;Pain Pain - Right/Left: Right Pain - part of body: Hand   Activity Tolerance Patient tolerated treatment well   Patient Left in chair;with call bell/phone within reach;with chair alarm set;with family/visitor present  Nurse Communication Mobility status;Need for lift equipment (NT)        Time: 8682-5749 OT Time Calculation (min): 45 min  Charges: OT General Charges $OT Visit: 1 Visit OT Treatments $Self Care/Home Management : 8-22 mins $Therapeutic Activity: 23-37 mins  [place] [address], OTR/L Acute [employer] Pager [phone] Office [phone]   Almon Register 02/22/2021, 2:40 PM

## 2021-02-22 NOTE — Progress Notes (Signed)
Inpatient Rehab Admissions Coordinator:   Met with patient and his family at bedside.  Dr Naaman Plummer completed peer to peer with [employer] this AM and denial was upheld.  Felt patient progressing slowly, with questionable tolerance for CIR, and possibly still being a moderate burden of care at discharge.  Per Dr. Naaman Plummer, he was told that if patient does discharge to SNF and starts to do better, CIR approval could be sought again.  I met with family to update them and provide denial information.  At this time they would like to try and expedited appeal, which I agree is worth pursuing.  I will start this today.  Updated Ellan Lambert and Terrilee Croak.    Shann Medal, PT, DPT Admissions Coordinator [phone] 02/22/21  12:30 PM

## 2021-02-22 NOTE — Progress Notes (Signed)
Patient ID: Angel [name], male   DOB: [birth year], 70 y.o.   MRN: [id number] 39 Days Post-Op   Subjective: Doing OK, likes new bed, ROS negative except as listed above. Objective: Vital signs in last 24 hours: Temp:  [98 F (36.7 C)-99.1 F (37.3 C)] 98 F (36.7 C) (10/07 1126) Pulse Rate:  [64-79] 79 (10/07 1126) Resp:  [13-18] 16 (10/07 1126) BP: (107-128)/(56-72) 117/66 (10/07 1126) SpO2:  [96 %-100 %] 99 % (10/07 1126) FiO2 (%):  [21 %] 21 % (10/07 0725) Weight:  [110.7 kg] 110.7 kg (10/07 0500) Last BM Date: 02/18/21  Intake/Output from previous day: 10/06 0701 - 10/07 0700 In: 340 [P.O.:240; I.V.:100] Out: 975 [Urine:975] Intake/Output this shift: Total I/O In: 240 [P.O.:240] Out: 350 [Urine:350]  General appearance: cooperative Neck: trach with [employer]: regular rate and rhythm GI: soft, NT Extremities: min edema  Lab Results: CBC  No results for input(s): WBC, HGB, HCT, PLT in the last 72 hours. BMET Recent Labs    02/20/21 0319 02/21/21 0434  NA 135 135  K 3.3* 3.5  CL 104 104  CO2 23 22  GLUCOSE 121* 92  BUN 20 17  CREATININE 1.16 1.17  CALCIUM 8.9 8.9   PT/INR No results for input(s): LABPROT, INR in the last 72 hours. ABG No results for input(s): PHART, HCO3 in the last 72 hours.  Invalid input(s): PCO2, PO2  Studies/Results: No results found.  Anti-infectives: Anti-infectives (From admission, onward)    Start     Dose/Rate Route Frequency Ordered Stop   02/04/21 1548  ceFAZolin (ANCEF) IVPB 2g/100 mL premix  Status:  Discontinued        over 30 Minutes  Continuous PRN 02/04/21 1549 02/09/21 1305   02/04/21 1545  ceFAZolin (ANCEF) IVPB 1 g/50 mL premix        1 g 100 mL/hr over 30 Minutes Intravenous  Once 02/04/21 1458 02/04/21 1600   02/04/21 1543  ceFAZolin (ANCEF) 2-4 GM/100ML-% IVPB       Note to Pharmacy: [place]   : cabinet override      02/04/21 1543 02/04/21 1651   01/11/21 2330  ceFEPIme (MAXIPIME) 2 g in  sodium chloride 0.9 % 100 mL IVPB  Status:  Discontinued        2 g 200 mL/hr over 30 Minutes Intravenous Every 24 hours 01/11/21 0711 01/16/21 0907   01/10/21 1645  ampicillin (OMNIPEN) 2 g in sodium chloride 0.9 % 100 mL IVPB  Status:  Discontinued        2 g 300 mL/hr over 20 Minutes Intravenous Every 8 hours 01/10/21 1549 01/16/21 0907   01/09/21 2200  ceFEPIme (MAXIPIME) 2 g in sodium chloride 0.9 % 100 mL IVPB  Status:  Discontinued        2 g 200 mL/hr over 30 Minutes Intravenous Every 12 hours 01/09/21 1458 01/11/21 0711   01/08/21 1515  metroNIDAZOLE (FLAGYL) IVPB 500 mg  Status:  Discontinued        500 mg 100 mL/hr over 60 Minutes Intravenous Every 8 hours 01/08/21 1428 01/10/21 1618   01/03/21 0600  vancomycin (VANCOREADY) IVPB 1250 mg/250 mL  Status:  Discontinued        1,250 mg 166.7 mL/hr over 90 Minutes Intravenous Every 12 hours 01/02/21 1717 01/03/21 0837   01/02/21 1800  vancomycin (VANCOREADY) IVPB 2000 mg/400 mL        2,000 mg 200 mL/hr over 120 Minutes Intravenous  Once 01/02/21 1712 01/02/21  2007   01/02/21 0900  ceFEPIme (MAXIPIME) 2 g in sodium chloride 0.9 % 100 mL IVPB  Status:  Discontinued        2 g 200 mL/hr over 30 Minutes Intravenous Every 8 hours 01/02/21 0849 01/09/21 1458       Assessment/Plan: Fall down stairs 8/12 VDRF - guaifenisen, S/P trach 8/29 by Dr. Bobbye Morton. Has tolerated HTC well, passy muir trials. Trach changed to cuffless #6 on 9/20. There is no XLT #4 cuffless, so downsized to standard #4CL 10/5. Try capping this weekend. TBI/SAH/SDH - NSGY c/s, Dr. Annette Stable. Significant frontal lobe injuries. Keppra x7d for sz ppx (completed) Occipital bone fx - NSGY c/s, Dr. Annette Stable Temporal bone fx extending into middle ear - ENT c/s, Dr. Constance Holster, no acute treatment, will need re-eval hearing and facial nerve  Right TM Rupture - ENT c/s, Dr. Constance Holster [facility] -  cardiology s/o 9/23 with recs: metoprolol 25mg  BID, amiodarone 200 mg BID x1 week, then decrease to 200  mg daily. Decreased Metoprolol 12.5mg  BID on 10/3 due to hypotension, orthostasis has resolved. ABL anemia- 1u PRBC 9/19, hgb stable. Continue Iron and vitamin c Bilateral pulmonary embolism - Eliquis AKI - Resolved. GFR > 60. Nephrology reports no further need for dialysis. Tunneled catheter out Hx DM2 - SSI. Decreased semglee to 16U BID 9/28. Appreciate DM coordinator assistance  Hx HTN - PRN meds FEN - Reg diet VTE - SCDs, Eliquis ID - off abx, no fevers Dispo - insurance denied CIR despite peer to peer. Will plan SNF. I spoke with his wife and son about the dispo situation. They are understandably disappointed.   LOS: 43 days    Georganna Skeans, MD, MPH, FACS Trauma & General Surgery Use [email or web address] to contact on call provider  02/22/2021

## 2021-02-22 NOTE — TOC Progression Note (Signed)
Transition of Care [facility]) - Progression Note    Patient Details  Name: Angel [name] MRN: [id number] Date of Birth: Apr 29, 1951  [facility] CM/SW Contact  Oren Section Cleta Alberts, RN Phone Number: 02/22/2021, 12:50pm  Clinical Narrative:    Denial for CIR has been upheld after peer-to-peer with [employer].  Patient and wife would like to pursue an expedited appeal.  They understand that if we receive another denial, backup plan will be SNF.  Will follow with updates as available.   Expected Discharge Plan: IP Rehab Facility Barriers to Discharge: Continued Medical Work up  Expected Discharge Plan and Services Expected Discharge Plan: [place]   Discharge Planning Services: CM Consult   Living arrangements for the past 2 months: Single Family Home                                       Social Determinants of Health (SDOH) Interventions    Readmission Risk Interventions No flowsheet data found.  Reinaldo Raddle, RN, BSN  Trauma/Neuro ICU Case Manager [phone]

## 2021-02-23 DIAGNOSIS — S066X9A Traumatic subarachnoid hemorrhage with loss of consciousness of unspecified duration, initial encounter: Secondary | ICD-10-CM | POA: Diagnosis not present

## 2021-02-23 DIAGNOSIS — I2699 Other pulmonary embolism without acute cor pulmonale: Secondary | ICD-10-CM | POA: Diagnosis not present

## 2021-02-23 DIAGNOSIS — Z20822 Contact with and (suspected) exposure to covid-19: Secondary | ICD-10-CM | POA: Diagnosis not present

## 2021-02-23 DIAGNOSIS — J9601 Acute respiratory failure with hypoxia: Secondary | ICD-10-CM | POA: Diagnosis not present

## 2021-02-23 LAB — RENAL FUNCTION PANEL
Albumin: 2.3 g/dL — ABNORMAL LOW (ref 3.5–5.0)
Anion gap: 8 (ref 5–15)
BUN: 16 mg/dL (ref 8–23)
CO2: 23 mmol/L (ref 22–32)
Calcium: 8.8 mg/dL — ABNORMAL LOW (ref 8.9–10.3)
Chloride: 105 mmol/L (ref 98–111)
Creatinine, Ser: 1.02 mg/dL (ref 0.61–1.24)
GFR, Estimated: >60 mL/min (ref >60)
Glucose, Bld: 112 mg/dL — ABNORMAL HIGH (ref 70–99)
Phosphorus: 4.1 mg/dL (ref 2.5–4.6)
Potassium: 3.6 mmol/L (ref 3.5–5.1)
Sodium: 136 mmol/L (ref 135–145)

## 2021-02-23 LAB — MAGNESIUM: Magnesium: 1.8 mg/dL (ref 1.7–2.4)

## 2021-02-23 LAB — GLUCOSE, CAPILLARY
Glucose-Capillary: 111 mg/dL — ABNORMAL HIGH (ref 70–99)
Glucose-Capillary: 154 mg/dL — ABNORMAL HIGH (ref 70–99)
Glucose-Capillary: 203 mg/dL — ABNORMAL HIGH (ref 70–99)
Glucose-Capillary: 125 mg/dL — ABNORMAL HIGH (ref 70–99)

## 2021-02-23 NOTE — Progress Notes (Signed)
Patient ID: Angel [name], male   DOB: Nov 25, 1950, 70 y.o.   MRN: [id number] 40 Days Post-Op   Subjective: Doing OK, no c/o. Wife at [place] negative except as listed above. Objective: Vital signs in last 24 hours: Temp:  [97.7 F (36.5 C)-98.9 F (37.2 C)] 98.4 F (36.9 C) (10/08 0749) Pulse Rate:  [55-79] 69 (10/08 0851) Resp:  [12-18] 18 (10/08 0851) BP: (116-130)/(63-75) 127/75 (10/08 0749) SpO2:  [96 %-100 %] 100 % (10/08 0851) FiO2 (%):  [21 %] 21 % (10/08 0851) Weight:  [112.5 kg] 112.5 kg (10/08 0542) Last BM Date: 02/21/21  Intake/Output from previous day: 10/07 0701 - 10/08 0700 In: 780 [P.O.:780] Out: 350 [Urine:350] Intake/Output this shift: No intake/output data recorded.  General appearance: cooperative Neck: trach with [employer]: regular rate and rhythm GI: soft, NT Extremities: min edema  Lab Results: CBC  No results for input(s): WBC, HGB, HCT, PLT in the last 72 hours. BMET Recent Labs    02/21/21 0434 02/23/21 0855  NA 135 136  K 3.5 3.6  CL 104 105  CO2 22 23  GLUCOSE 92 112*  BUN 17 16  CREATININE 1.17 1.02  CALCIUM 8.9 8.8*    PT/INR No results for input(s): LABPROT, INR in the last 72 hours. ABG No results for input(s): PHART, HCO3 in the last 72 hours.  Invalid input(s): PCO2, PO2  Studies/Results: No results found.  Anti-infectives: Anti-infectives (From admission, onward)    Start     Dose/Rate Route Frequency Ordered Stop   02/04/21 1548  ceFAZolin (ANCEF) IVPB 2g/100 mL premix  Status:  Discontinued        over 30 Minutes  Continuous PRN 02/04/21 1549 02/09/21 1305   02/04/21 1545  ceFAZolin (ANCEF) IVPB 1 g/50 mL premix        1 g 100 mL/hr over 30 Minutes Intravenous  Once 02/04/21 1458 02/04/21 1600   02/04/21 1543  ceFAZolin (ANCEF) 2-4 GM/100ML-% IVPB       Note to Pharmacy: Lytle Butte   : cabinet override      02/04/21 1543 02/04/21 1651   01/11/21 2330  ceFEPIme (MAXIPIME) 2 g in sodium chloride 0.9 %  100 mL IVPB  Status:  Discontinued        2 g 200 mL/hr over 30 Minutes Intravenous Every 24 hours 01/11/21 0711 01/16/21 0907   01/10/21 1645  ampicillin (OMNIPEN) 2 g in sodium chloride 0.9 % 100 mL IVPB  Status:  Discontinued        2 g 300 mL/hr over 20 Minutes Intravenous Every 8 hours 01/10/21 1549 01/16/21 0907   01/09/21 2200  ceFEPIme (MAXIPIME) 2 g in sodium chloride 0.9 % 100 mL IVPB  Status:  Discontinued        2 g 200 mL/hr over 30 Minutes Intravenous Every 12 hours 01/09/21 1458 01/11/21 0711   01/08/21 1515  metroNIDAZOLE (FLAGYL) IVPB 500 mg  Status:  Discontinued        500 mg 100 mL/hr over 60 Minutes Intravenous Every 8 hours 01/08/21 1428 01/10/21 1618   01/03/21 0600  vancomycin (VANCOREADY) IVPB 1250 mg/250 mL  Status:  Discontinued        1,250 mg 166.7 mL/hr over 90 Minutes Intravenous Every 12 hours 01/02/21 1717 01/03/21 0837   01/02/21 1800  vancomycin (VANCOREADY) IVPB 2000 mg/400 mL        2,000 mg 200 mL/hr over 120 Minutes Intravenous  Once 01/02/21 1712 01/02/21 2007  01/02/21 0900  ceFEPIme (MAXIPIME) 2 g in sodium chloride 0.9 % 100 mL IVPB  Status:  Discontinued        2 g 200 mL/hr over 30 Minutes Intravenous Every 8 hours 01/02/21 0849 01/09/21 1458       Assessment/Plan: Fall down stairs 8/12 VDRF - guaifenisen, S/P trach 8/29 by Dr. Bobbye Morton. Has tolerated HTC well, passy muir trials. Trach changed to cuffless #6 on 9/20. There is no XLT #4 cuffless, so downsized to standard #4CL 10/5. Try capping this weekend. TBI/SAH/SDH - NSGY c/s, Dr. Annette Stable. Significant frontal lobe injuries. Keppra x7d for sz ppx (completed) Occipital bone fx - NSGY c/s, Dr. Annette Stable Temporal bone fx extending into middle ear - ENT c/s, Dr. Constance Holster, no acute treatment, will need re-eval hearing and facial nerve  Right TM Rupture - ENT c/s, Dr. Constance Holster [facility] -  cardiology s/o 9/23 with recs: metoprolol 25mg  BID, amiodarone 200 mg BID x1 week, then decrease to 200 mg daily. Decreased  Metoprolol 12.5mg  BID on 10/3 due to hypotension, orthostasis has resolved. ABL anemia- 1u PRBC 9/19, hgb stable. Continue Iron and vitamin c Bilateral pulmonary embolism - Eliquis AKI - Resolved. GFR > 60. Nephrology reports no further need for dialysis. Tunneled catheter out Hx DM2 - SSI. Decreased semglee to 16U BID 9/28. Appreciate DM coordinator assistance  Hx HTN - PRN meds FEN - Reg diet VTE - SCDs, Eliquis ID - off abx, no fevers Dispo - insurance denied CIR despite peer to peer. Will plan SNF. I spoke with his wife and son about the dispo situation. They are understandably disappointed.  Will try capping trach today. Monitor for BM   LOS: 71 days   Leighton Ruff. Redmond Pulling, MD, FACS General, Bariatric, & Minimally Invasive Surgery [facility], [place]   02/23/2021

## 2021-02-23 NOTE — NC FL2 (Signed)
[place] LEVEL OF CARE SCREENING TOOL     IDENTIFICATION  Patient Name: Angel [name] Birthdate: 1950/06/25 Sex: male Admission Date (Current Location): 12/28/2020  [facility] [place] Number:  Herbalist and Address:  [facility] [place] [place], [place] [address], [place], [place] [address]      Provider Number: [id number]  Attending Physician Name and Address:  Md, Trauma, MD  Relative Name and Phone Number:  Khoi Hamberger, [phone]    Current Level of Care: Hospital Recommended Level of Care: [place] Prior Approval Number:    Date Approved/Denied:   PASRR Number: [id number]  Discharge Plan: SNF    Current Diagnoses: Patient Active Problem List   Diagnosis Date Noted   Respiratory failure [facility])    Atrial fibrillation with rapid ventricular response (HCC)    Primary hypertension    SAH (subarachnoid hemorrhage) ([place]) 12/28/2020    Orientation RESPIRATION BLADDER Height & Weight        Tracheostomy (5 L/M) Incontinent Weight: 248 lb (112.5 kg) Height:  6\' 2"  (188 cm)  BEHAVIORAL SYMPTOMS/MOOD NEUROLOGICAL BOWEL NUTRITION STATUS      Incontinent Diet (carb modified fluid thin)  AMBULATORY STATUS COMMUNICATION OF NEEDS Skin   Extensive Assist Verbally Normal                       Personal Care Assistance Level of Assistance  Bathing, Feeding, Dressing Bathing Assistance: Maximum assistance Feeding assistance: Limited assistance Dressing Assistance: Maximum assistance     Functional Limitations Info             SPECIAL CARE FACTORS FREQUENCY  PT (By licensed PT), OT (By licensed OT)     PT Frequency: 5x weekly OT Frequency: 5x weekly            Contractures Contractures Info: Not present    Additional Factors Info  Code Status, Allergies Code Status Info: Full Allergies Info: NKDA           Current Medications (02/23/2021):  This is the current hospital active medication list Current  Facility-Administered Medications  Medication Dose Route Frequency Provider Last Rate Last Admin   acetaminophen (TYLENOL) tablet 1,000 mg  1,000 mg Oral Q6H Georganna Skeans, MD   1,000 mg at 02/23/21 0504   ALPRAZolam Duanne Moron) tablet 0.25 mg  0.25 mg Oral BID PRN Georganna Skeans, MD   0.25 mg at 02/22/21 2140   amiodarone (PACERONE) tablet 200 mg  200 mg Oral Daily Donato Heinz, MD   200 mg at 02/22/21 1016   apixaban (ELIQUIS) tablet 5 mg  5 mg Oral BID Georganna Skeans, MD   5 mg at 02/22/21 2141   artificial tears (LACRILUBE) ophthalmic ointment   Both Eyes PRN Jesusita Oka, MD   1 application at 27/78/24 1724   ascorbic acid (VITAMIN C) tablet 500 mg  500 mg Oral BID Meuth, Brooke A, PA-C   500 mg at 02/22/21 2140   bisacodyl (DULCOLAX) suppository 10 mg  10 mg Rectal Daily PRN Meuth, Brooke A, PA-C   10 mg at 02/18/21 0959   chlorhexidine (PERIDEX) 0.12 % solution 15 mL  15 mL Mouth Rinse BID Georganna Skeans, MD   15 mL at 02/22/21 2141   docusate sodium (COLACE) capsule 100 mg  100 mg Oral BID Georganna Skeans, MD   100 mg at 02/22/21 2141   feeding supplement (NEPRO CARB STEADY) liquid 237 mL  237 mL Oral  TID BM Meuth, Brooke A, PA-C   237 mL at 02/22/21 2143   ferrous sulfate tablet 325 mg  325 mg Oral BID WC Meuth, Brooke A, PA-C   325 mg at 02/22/21 1646   guaiFENesin (ROBITUSSIN) 100 MG/5ML solution 300 mg  15 mL Oral Q4H Georganna Skeans, MD   300 mg at 02/23/21 0505   hydrALAZINE (APRESOLINE) injection 10 mg  10 mg Intravenous Q4H PRN Jesusita Oka, MD   10 mg at 01/24/21 1634   HYDROmorphone (DILAUDID) injection 0.5-1 mg  0.5-1 mg Intravenous Q2H PRN Georganna Skeans, MD   1 mg at 01/30/21 0521   insulin aspart (novoLOG) injection 0-15 Units  0-15 Units Subcutaneous TID WC & HS Jillyn Ledger, PA-C   2 Units at 02/22/21 2147   insulin glargine-yfgn (SEMGLEE) injection 16 Units  16 Units Subcutaneous BID Margie Billet A, PA-C   16 Units at 02/22/21 2141    ipratropium-albuterol (DUONEB) 0.5-2.5 (3) MG/3ML nebulizer solution 3 mL  3 mL Nebulization Q6H PRN Meuth, Brooke A, PA-C   3 mL at 02/17/21 1548   MEDLINE mouth rinse  15 mL Mouth Rinse q12n4p Georganna Skeans, MD   15 mL at 02/22/21 1647   methocarbamol (ROBAXIN) tablet 1,000 mg  1,000 mg Oral Q8H Georganna Skeans, MD   1,000 mg at 02/23/21 0504   metoprolol tartrate (LOPRESSOR) tablet 12.5 mg  12.5 mg Oral BID Meuth, Brooke A, PA-C   12.5 mg at 02/22/21 2141   ondansetron (ZOFRAN-ODT) disintegrating tablet 4 mg  4 mg Oral Q6H PRN Jesusita Oka, MD       Or   ondansetron (ZOFRAN) injection 4 mg  4 mg Intravenous Q6H PRN Jesusita Oka, MD   4 mg at 01/27/21 8315   oxyCODONE (Oxy IR/ROXICODONE) immediate release tablet 10-15 mg  10-15 mg Oral Q4H PRN Georganna Skeans, MD   10 mg at 02/23/21 0504   pantoprazole (PROTONIX) EC tablet 40 mg  40 mg Oral Daily Georganna Skeans, MD   40 mg at 02/22/21 1015   polyethylene glycol (MIRALAX / GLYCOLAX) packet 17 g  17 g Oral Daily Jesusita Oka, MD   17 g at 02/19/21 0901   QUEtiapine (SEROQUEL) tablet 25 mg  25 mg Oral QHS Georganna Skeans, MD   25 mg at 02/22/21 2140   senna (SENOKOT) tablet 8.6 mg  1 tablet Oral Daily Jesusita Oka, MD   8.6 mg at 02/22/21 1014   Facility-Administered Medications Ordered in Other Encounters  Medication Dose Route Frequency Provider Last Rate Last Admin   lidocaine (cardiac) 100 mg/19mL (XYLOCAINE) injection 2%   Intravenous Anesthesia Intra-op Viann Fish B, CRNA   100 mg at 01/11/21 1439   succinylcholine (ANECTINE) syringe   Intravenous Anesthesia Intra-op Viann Fish B, CRNA   140 mg at 01/11/21 1439     Discharge Medications: Please see discharge summary for a list of discharge medications.  Relevant Imaging Results:  Relevant Lab Results:   Additional [place], LCSW

## 2021-02-23 NOTE — Progress Notes (Signed)
Pts trach capped per MD order. Pt is tolerating well at this time. RN made aware.

## 2021-02-24 DIAGNOSIS — Z20822 Contact with and (suspected) exposure to covid-19: Secondary | ICD-10-CM | POA: Diagnosis not present

## 2021-02-24 DIAGNOSIS — J9601 Acute respiratory failure with hypoxia: Secondary | ICD-10-CM | POA: Diagnosis not present

## 2021-02-24 DIAGNOSIS — S066X9A Traumatic subarachnoid hemorrhage with loss of consciousness of unspecified duration, initial encounter: Secondary | ICD-10-CM | POA: Diagnosis not present

## 2021-02-24 DIAGNOSIS — I2699 Other pulmonary embolism without acute cor pulmonale: Secondary | ICD-10-CM | POA: Diagnosis not present

## 2021-02-24 LAB — GLUCOSE, CAPILLARY
Glucose-Capillary: 106 mg/dL — ABNORMAL HIGH (ref 70–99)
Glucose-Capillary: 156 mg/dL — ABNORMAL HIGH (ref 70–99)
Glucose-Capillary: 161 mg/dL — ABNORMAL HIGH (ref 70–99)
Glucose-Capillary: 134 mg/dL — ABNORMAL HIGH (ref 70–99)

## 2021-02-24 LAB — RENAL FUNCTION PANEL
Albumin: 2.4 g/dL — ABNORMAL LOW (ref 3.5–5.0)
Anion gap: 11 (ref 5–15)
BUN: 16 mg/dL (ref 8–23)
CO2: 19 mmol/L — ABNORMAL LOW (ref 22–32)
Calcium: 9 mg/dL (ref 8.9–10.3)
Chloride: 106 mmol/L (ref 98–111)
Creatinine, Ser: 0.99 mg/dL (ref 0.61–1.24)
GFR, Estimated: >60 mL/min (ref >60)
Glucose, Bld: 90 mg/dL (ref 70–99)
Phosphorus: 3.4 mg/dL (ref 2.5–4.6)
Potassium: 3.4 mmol/L — ABNORMAL LOW (ref 3.5–5.1)
Sodium: 136 mmol/L (ref 135–145)

## 2021-02-24 LAB — MAGNESIUM: Magnesium: 1.7 mg/dL (ref 1.7–2.4)

## 2021-02-24 MED ORDER — POTASSIUM CHLORIDE CRYS ER 20 MEQ PO TBCR
30.0000 meq | EXTENDED_RELEASE_TABLET | Freq: Once | ORAL | Status: AC
  Administered 2021-02-24: 30 meq via ORAL
  Filled 2021-02-24: qty 1

## 2021-02-24 NOTE — Progress Notes (Signed)
Trach uncapped D/T pt anxiety and saying he can not get his air in. MD aware

## 2021-02-24 NOTE — Progress Notes (Signed)
Pt cap removed and placed on trach collar due to increased WOB pt stated he was fine all day until tonight then it started to get harder to breath.

## 2021-02-24 NOTE — Progress Notes (Signed)
Pts trach capped and on RA. Pt is tolerating well at this time. RN made aware.

## 2021-02-24 NOTE — Progress Notes (Signed)
Patient ID: Angel [name], male   DOB: 09/10/1950, 70 y.o.   MRN: [id number] 41 Days Post-Op   Subjective: Doing OK, no c/o. Wife at [place] trach until got anxious -no change in vitals, RR, etc ROS negative except as listed above. Objective: Vital signs in last 24 hours: Temp:  [98.2 F (36.8 C)-98.8 F (37.1 C)] 98.4 F (36.9 C) (10/09 0258) Pulse Rate:  [60-96] 68 (10/09 0855) Resp:  [13-20] 15 (10/09 0855) BP: (107-129)/(61-73) 129/72 (10/09 0258) SpO2:  [95 %-98 %] 96 % (10/09 1031) FiO2 (%):  [21 %] 21 % (10/09 1031) Weight:  [588 kg] 112 kg (10/09 0500) Last BM Date: 02/23/21  Intake/Output from previous day: 10/08 0701 - 10/09 0700 In: 480 [P.O.:480] Out: -  Intake/Output this shift: No intake/output data recorded.  General appearance: cooperative Neck: trach with [employer]: regular rate and rhythm GI: soft, NT Extremities: min edema  Lab Results: CBC  No results for input(s): WBC, HGB, HCT, PLT in the last 72 hours. BMET Recent Labs    02/23/21 0855 02/24/21 0442  NA 136 136  K 3.6 3.4*  CL 105 106  CO2 23 19*  GLUCOSE 112* 90  BUN 16 16  CREATININE 1.02 0.99  CALCIUM 8.8* 9.0    PT/INR No results for input(s): LABPROT, INR in the last 72 hours. ABG No results for input(s): PHART, HCO3 in the last 72 hours.  Invalid input(s): PCO2, PO2  Studies/Results: No results found.  Anti-infectives: Anti-infectives (From admission, onward)    Start     Dose/Rate Route Frequency Ordered Stop   02/04/21 1548  ceFAZolin (ANCEF) IVPB 2g/100 mL premix  Status:  Discontinued        over 30 Minutes  Continuous PRN 02/04/21 1549 02/09/21 1305   02/04/21 1545  ceFAZolin (ANCEF) IVPB 1 g/50 mL premix        1 g 100 mL/hr over 30 Minutes Intravenous  Once 02/04/21 1458 02/04/21 1600   02/04/21 1543  ceFAZolin (ANCEF) 2-4 GM/100ML-% IVPB       Note to Pharmacy: Lytle Butte   : cabinet override      02/04/21 1543 02/04/21 1651   01/11/21  2330  ceFEPIme (MAXIPIME) 2 g in sodium chloride 0.9 % 100 mL IVPB  Status:  Discontinued        2 g 200 mL/hr over 30 Minutes Intravenous Every 24 hours 01/11/21 0711 01/16/21 0907   01/10/21 1645  ampicillin (OMNIPEN) 2 g in sodium chloride 0.9 % 100 mL IVPB  Status:  Discontinued        2 g 300 mL/hr over 20 Minutes Intravenous Every 8 hours 01/10/21 1549 01/16/21 0907   01/09/21 2200  ceFEPIme (MAXIPIME) 2 g in sodium chloride 0.9 % 100 mL IVPB  Status:  Discontinued        2 g 200 mL/hr over 30 Minutes Intravenous Every 12 hours 01/09/21 1458 01/11/21 0711   01/08/21 1515  metroNIDAZOLE (FLAGYL) IVPB 500 mg  Status:  Discontinued        500 mg 100 mL/hr over 60 Minutes Intravenous Every 8 hours 01/08/21 1428 01/10/21 1618   01/03/21 0600  vancomycin (VANCOREADY) IVPB 1250 mg/250 mL  Status:  Discontinued        1,250 mg 166.7 mL/hr over 90 Minutes Intravenous Every 12 hours 01/02/21 1717 01/03/21 0837   01/02/21 1800  vancomycin (VANCOREADY) IVPB 2000 mg/400 mL        2,000 mg 200  mL/hr over 120 Minutes Intravenous  Once 01/02/21 1712 01/02/21 2007   01/02/21 0900  ceFEPIme (MAXIPIME) 2 g in sodium chloride 0.9 % 100 mL IVPB  Status:  Discontinued        2 g 200 mL/hr over 30 Minutes Intravenous Every 8 hours 01/02/21 0849 01/09/21 1458       Assessment/Plan: Fall down stairs 8/12 VDRF - guaifenisen, S/P trach 8/29 by Dr. Bobbye Morton. Has tolerated HTC well, passy muir trials. Trach changed to cuffless #6 on 9/20. There is no XLT #4 cuffless, so downsized to standard #4CL 10/5. Try capping this weekend. TBI/SAH/SDH - NSGY c/s, Dr. Annette Stable. Significant frontal lobe injuries. Keppra x7d for sz ppx (completed) Occipital bone fx - NSGY c/s, Dr. Annette Stable Temporal bone fx extending into middle ear - ENT c/s, Dr. Constance Holster, no acute treatment, will need re-eval hearing and facial nerve  Right TM Rupture - ENT c/s, Dr. Constance Holster [facility] -  cardiology s/o 9/23 with recs: metoprolol 25mg  BID, amiodarone 200 mg  BID x1 week, then decrease to 200 mg daily. Decreased Metoprolol 12.5mg  BID on 10/3 due to hypotension, orthostasis has resolved. ABL anemia- 1u PRBC 9/19, hgb stable. Continue Iron and vitamin c Bilateral pulmonary embolism - Eliquis AKI - Resolved. GFR > 60. Nephrology reports no further need for dialysis. Tunneled catheter out Hx DM2 - SSI. Decreased semglee to 16U BID 9/28. Appreciate DM coordinator assistance  Hx HTN - PRN meds FEN - Reg diet; hypokalemia 10/9 - replace potassium VTE - SCDs, Eliquis ID - off abx, no fevers Dispo - insurance denied CIR despite peer to peer. Will plan SNF. I spoke with his wife; appears to get anxious after awhile with capping trach without change in vitals, O2, probably just needs to be decannulated but will reassess on Monday.     LOS: 87 days   Leighton Ruff. Redmond Pulling, MD, FACS General, Bariatric, & Minimally Invasive Surgery [facility], [place]   02/24/2021

## 2021-02-24 NOTE — Plan of Care (Signed)

## 2021-02-25 DIAGNOSIS — J9601 Acute respiratory failure with hypoxia: Secondary | ICD-10-CM | POA: Diagnosis not present

## 2021-02-25 DIAGNOSIS — Z20822 Contact with and (suspected) exposure to covid-19: Secondary | ICD-10-CM | POA: Diagnosis not present

## 2021-02-25 DIAGNOSIS — S066X9A Traumatic subarachnoid hemorrhage with loss of consciousness of unspecified duration, initial encounter: Secondary | ICD-10-CM | POA: Diagnosis not present

## 2021-02-25 DIAGNOSIS — I2699 Other pulmonary embolism without acute cor pulmonale: Secondary | ICD-10-CM | POA: Diagnosis not present

## 2021-02-25 LAB — RENAL FUNCTION PANEL
Albumin: 2.4 g/dL — ABNORMAL LOW (ref 3.5–5.0)
Anion gap: 9 (ref 5–15)
BUN: 15 mg/dL (ref 8–23)
CO2: 22 mmol/L (ref 22–32)
Calcium: 9 mg/dL (ref 8.9–10.3)
Chloride: 104 mmol/L (ref 98–111)
Creatinine, Ser: 0.99 mg/dL (ref 0.61–1.24)
GFR, Estimated: >60 mL/min (ref >60)
Glucose, Bld: 152 mg/dL — ABNORMAL HIGH (ref 70–99)
Phosphorus: 3.7 mg/dL (ref 2.5–4.6)
Potassium: 3.7 mmol/L (ref 3.5–5.1)
Sodium: 135 mmol/L (ref 135–145)

## 2021-02-25 LAB — GLUCOSE, CAPILLARY
Glucose-Capillary: 101 mg/dL — ABNORMAL HIGH (ref 70–99)
Glucose-Capillary: 147 mg/dL — ABNORMAL HIGH (ref 70–99)
Glucose-Capillary: 164 mg/dL — ABNORMAL HIGH (ref 70–99)

## 2021-02-25 MED ORDER — GABAPENTIN 300 MG PO CAPS
300.0000 mg | ORAL_CAPSULE | Freq: Two times a day (BID) | ORAL | Status: DC
  Administered 2021-02-25 – 2021-03-01 (×8): 300 mg via ORAL
  Filled 2021-02-25 (×8): qty 1

## 2021-02-25 NOTE — Progress Notes (Signed)
Occupational Therapy Treatment Patient Details Name: Angel [name] MRN: [id number] DOB: [birth year] Today's Date: 02/25/2021   History of present illness Angel [name] is a 70 y.o. 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 CT showed R temporal and parietal SAH, SAH anterior frontal lobes  bilaterally. Also sustained right occipital skull fracture, temporal bone fx, right TM rupture, bilateral PE. Intubated 8/12, trach'd 8/29. CRRT 9/1- 9/9.  PMH: DM2, HTN   OT comments  Per pt/wife, pt did not sleep well last night due to issues with trach, however agreeable to work with OT/PT. Stedy used to assist with mobility. Requires Mod A +2 with mobility, however able to stand from paddles with min A of 1. In standing able to begin tolerating R/L weight shifts for pre-gait work.  Would likely do better if pt had not had such a difficult night. Once seated, participated in ADL tasks. R hand tremor still present however  improved, increasing his ability to feed self with R dominant hand. Given amount of progress, continue to recommend extensive rehab at [facility].    Recommendations for follow up therapy are one component of a multi-disciplinary discharge planning process, led by the attending physician.  Recommendations may be updated based on patient status, additional functional criteria and insurance authorization.    Follow Up Recommendations  CIR;Supervision/Assistance - 24 hour    Equipment Recommendations  3 in 1 bedside commode;Wheelchair (measurements OT);Wheelchair cushion (measurements OT);Hospital bed    Recommendations for Other Services Rehab consult    Precautions / Restrictions Precautions Precautions: Fall Precaution Comments: trach collar, watch BP HR       Mobility Bed Mobility Overal bed mobility: Modified Independent       Supine to sit: Mod assist          Transfers Overall transfer level: Needs assistance   Transfers: Sit to/from Stand Sit to Stand: Mod  assist;+2 physical assistance (Min A from paddles)              Balance     Sitting balance-Leahy Scale: Fair Sitting balance - Comments: leaning L at times; most likley affected by level of fatigue from not sleeping last night     Standing balance-Leahy Scale: Poor                             ADL either performed or assessed with clinical judgement   ADL Overall ADL's : Needs assistance/impaired Eating/Feeding: Supervision/ safety;Set up Eating/Feeding Details (indicate cue type and reason): using foam handles Grooming: Minimal assistance   Upper Body Bathing: Minimal assistance;Sitting   Lower Body Bathing: Moderate assistance;Sit to/from stand   Upper Body Dressing : Moderate assistance   Lower Body Dressing: Maximal assistance;Sit to/from stand               Functional mobility during ADLs: Moderate assistance;+2 for physical assistance       Vision       Perception     Praxis      Cognition Arousal/Alertness: Awake/alert Behavior During Therapy: WFL for tasks assessed/performed Overall Cognitive Status: Impaired/Different from baseline Area of Impairment: Attention;Memory;Safety/judgement;Awareness;Problem solving               Rancho Levels of Cognitive Functioning Ranch[place] Amigos Scales of Cognitive Functioning: Purposeful/appropriate   Current Attention Level: Selective Memory: Decreased short-term memory Following Commands: Follows one step commands consistently Safety/Judgement: Decreased awareness of safety Awareness: Emergent   General Comments:  interactive; fatigued from not sleeping well last night however appropriate thorughout the session        Exercises Other Exercises Other Exercises: sit - stand in stedy x 3   Shoulder Instructions       General Comments      Pertinent Vitals/ Pain       Pain Assessment: Faces Faces Pain Scale: Hurts a little bit Pain Location: genealized Pain Descriptors /  Indicators: Discomfort Pain Intervention(s): Limited activity within patient's tolerance  Home Living                                          Prior Functioning/Environment              Frequency  Min 2X/week        Progress Toward Goals  OT Goals(current goals can now be found in the care plan section)  Progress towards OT goals: Progressing toward goals  Acute Rehab OT Goals Patient Stated Goal: to go to rehab OT Goal Formulation: With patient/family Time For Goal Achievement: 03/01/21 Potential to Achieve Goals: Good ADL Goals Pt Will Perform Eating:  (goal met 10/10) Pt Will Perform Grooming: with set-up;with supervision;sitting;with adaptive equipment Pt Will Perform Upper Body Bathing: with set-up;with supervision;sitting Pt Will Perform Lower Body Bathing: with mod assist;with adaptive equipment;bed level Pt Will Transfer to Toilet: with +2 assist;bedside commode;with mod assist Pt Will Perform Toileting - Clothing Manipulation and hygiene: with mod assist;sitting/lateral leans;with adaptive equipment Additional ADL Goal #1: pt will follow 2 step comamnds 50% of session Additional ADL Goal #2: pt will visually locate 2 adl items 50% of request Additional ADL Goal #3: pt will static sit eob mod (A) for 10 minutes with stable VSS  Plan Discharge plan remains appropriate    Co-evaluation    PT/OT/SLP Co-Evaluation/Treatment: Yes Reason for Co-Treatment: For patient/therapist safety;To address functional/ADL transfers   OT goals addressed during session: ADL's and self-care;Strengthening/ROM      AM-PAC OT "6 Clicks" Daily Activity     Outcome Measure   Help from another person eating meals?: A Little Help from another person taking care of personal grooming?: A Little Help from another person toileting, which includes using toliet, bedpan, or urinal?: A Lot Help from another person bathing (including washing, rinsing, drying)?: A  Lot Help from another person to put on and taking off regular upper body clothing?: A Little Help from another person to put on and taking off regular lower body clothing?: A Lot 6 Click Score: 15    End of Session Equipment Utilized During Treatment: Oxygen (5L 28% FiO2)  OT Visit Diagnosis: Unsteadiness on feet (R26.81);Other abnormalities of gait and mobility (R26.89);Muscle weakness (generalized) (M62.81);Other symptoms and signs involving cognitive function;Pain Pain - part of body:  (generalzied)   Activity Tolerance Patient tolerated treatment well   Patient Left in chair;with call bell/phone within reach;with chair alarm set;with family/visitor present   Nurse Communication Mobility status;Need for lift equipment        Time: 6256-3893 OT Time Calculation (min): 35 min  Charges: OT General Charges $OT Visit: 1 Visit OT Treatments $Self Care/Home Management : 8-22 mins  Maurie Boettcher, OT/L   Acute OT Clinical Specialist [place] Pager [phone] Office [phone]   [facility] 02/25/2021, 12:54 PM

## 2021-02-25 NOTE — Plan of Care (Signed)

## 2021-02-25 NOTE — Progress Notes (Signed)
Patient ID: Angel [name], male   DOB: 1950-07-01, 69 y.o.   MRN: [id number] 42 Days Post-Op   Subjective: Doing OK, no c/o. Wife at [facility]  No complaints this am  ROS negative except as listed above. Objective: Vital signs in last 24 hours: Temp:  [97.9 F (36.6 C)-98.3 F (36.8 C)] 97.9 F (36.6 C) (10/10 0841) Pulse Rate:  [52-74] 66 (10/10 0841) Resp:  [15-21] 16 (10/10 0841) BP: (95-140)/(57-75) 95/75 (10/10 0841) SpO2:  [92 %-100 %] 100 % (10/10 0841) FiO2 (%):  [21 %] 21 % (10/10 0822) Last BM Date: 02/23/21  Intake/Output from previous day: No intake/output data recorded. Intake/Output this shift: No intake/output data recorded.  General appearance: cooperative Neck: trach with [employer]: regular rate and rhythm GI: soft, NT Extremities: min edema  Lab Results: CBC  No results for input(s): WBC, HGB, HCT, PLT in the last 72 hours. BMET Recent Labs    02/24/21 0442 02/25/21 0225  NA 136 135  K 3.4* 3.7  CL 106 104  CO2 19* 22  GLUCOSE 90 152*  BUN 16 15  CREATININE 0.99 0.99  CALCIUM 9.0 9.0    PT/INR No results for input(s): LABPROT, INR in the last 72 hours. ABG No results for input(s): PHART, HCO3 in the last 72 hours.  Invalid input(s): PCO2, PO2  Studies/Results: No results found.  Anti-infectives: Anti-infectives (From admission, onward)    Start     Dose/Rate Route Frequency Ordered Stop   02/04/21 1548  ceFAZolin (ANCEF) IVPB 2g/100 mL premix  Status:  Discontinued        over 30 Minutes  Continuous PRN 02/04/21 1549 02/09/21 1305   02/04/21 1545  ceFAZolin (ANCEF) IVPB 1 g/50 mL premix        1 g 100 mL/hr over 30 Minutes Intravenous  Once 02/04/21 1458 02/04/21 1600   02/04/21 1543  ceFAZolin (ANCEF) 2-4 GM/100ML-% IVPB       Note to Pharmacy: [place]   : cabinet override      02/04/21 1543 02/04/21 1651   01/11/21 2330  ceFEPIme (MAXIPIME) 2 g in sodium chloride 0.9 % 100 mL IVPB  Status:  Discontinued        2 g 200  mL/hr over 30 Minutes Intravenous Every 24 hours 01/11/21 0711 01/16/21 0907   01/10/21 1645  ampicillin (OMNIPEN) 2 g in sodium chloride 0.9 % 100 mL IVPB  Status:  Discontinued        2 g 300 mL/hr over 20 Minutes Intravenous Every 8 hours 01/10/21 1549 01/16/21 0907   01/09/21 2200  ceFEPIme (MAXIPIME) 2 g in sodium chloride 0.9 % 100 mL IVPB  Status:  Discontinued        2 g 200 mL/hr over 30 Minutes Intravenous Every 12 hours 01/09/21 1458 01/11/21 0711   01/08/21 1515  metroNIDAZOLE (FLAGYL) IVPB 500 mg  Status:  Discontinued        500 mg 100 mL/hr over 60 Minutes Intravenous Every 8 hours 01/08/21 1428 01/10/21 1618   01/03/21 0600  vancomycin (VANCOREADY) IVPB 1250 mg/250 mL  Status:  Discontinued        1,250 mg 166.7 mL/hr over 90 Minutes Intravenous Every 12 hours 01/02/21 1717 01/03/21 0837   01/02/21 1800  vancomycin (VANCOREADY) IVPB 2000 mg/400 mL        2,000 mg 200 mL/hr over 120 Minutes Intravenous  Once 01/02/21 1712 01/02/21 2007   01/02/21 0900  ceFEPIme (MAXIPIME) 2 g in  sodium chloride 0.9 % 100 mL IVPB  Status:  Discontinued        2 g 200 mL/hr over 30 Minutes Intravenous Every 8 hours 01/02/21 0849 01/09/21 1458       Assessment/Plan: Fall down stairs 8/12 VDRF - guaifenisen, S/P trach 8/29 by Dr. Bobbye Morton. Has tolerated HTC well, passy muir trials. Trach changed to cuffless #6 on 9/20. There is no XLT #4 cuffless, so downsized to standard #4CL 10/5. Try capping TBI/SAH/SDH - NSGY c/s, Dr. Annette Stable. Significant frontal lobe injuries. Keppra x7d for sz ppx (completed) Occipital bone fx - NSGY c/s, Dr. Annette Stable Temporal bone fx extending into middle ear - ENT c/s, Dr. Constance Holster, no acute treatment, will need re-eval hearing and facial nerve  Right TM Rupture - ENT c/s, Dr. Constance Holster [facility] -  cardiology s/o 9/23 with recs: metoprolol 25mg  BID, amiodarone 200 mg BID x1 week, then decrease to 200 mg daily. Decreased Metoprolol 12.5mg  BID on 10/3 due to hypotension, orthostasis has  resolved. ABL anemia- 1u PRBC 9/19, hgb stable. Continue Iron and vitamin c Bilateral pulmonary embolism - Eliquis AKI - Resolved. GFR > 60. Nephrology reports no further need for dialysis. Tunneled catheter out Hx DM2 - SSI. Decreased semglee to 16U BID 9/28. Appreciate DM coordinator assistance  Hx HTN - PRN meds FEN - Reg diet; hypokalemia 10/9 - replace potassium VTE - SCDs, Eliquis ID - off abx, no fevers Dispo - insurance denied CIR despite peer to peer. Will plan SNF.  May consider decannulation tomorrow     LOS: 59 days   Felicie Morn, MD    02/25/2021

## 2021-02-25 NOTE — TOC Progression Note (Signed)
Transition of Care [facility]) - Progression Note    Patient Details  Name: Angel [name] MRN: [id number] Date of Birth: [birth year]  Transition of [facility]) CM/SW Contact  Oren Section Cleta Alberts, RN Phone Number: 02/25/2021, 3:18 PM  Clinical Narrative:    Follow-up phone call made to patient's wife regarding need for skilled nursing facility, as inpatient rehab has officially been denied by insurance.  Patient has been faxed out to area skilled nursing facilities; will await bed offers.  Should trach be discontinued tomorrow will update FL-2 and refax out in an effort to receive more bed offers.   Expected Discharge Plan: [place] Barriers to Discharge: Continued Medical Work up  Expected Discharge Plan and Services Expected Discharge Plan: [place]   Discharge Planning Services: CM Consult   Living arrangements for the past 2 months: Single Family Home                                       Social Determinants of Health (SDOH) Interventions    Readmission Risk Interventions No flowsheet data found.  Reinaldo Raddle, RN, BSN  Trauma/Neuro ICU Case Manager [phone]

## 2021-02-25 NOTE — Progress Notes (Signed)
Inpatient Rehab Admissions Coordinator:   I received a call from Pt.'s insurance regarding insurance appeal status. [employer] is upholding their original decision to deny coverage for CIR. I notified Pt. And Pt.'s wife that they will need to consider SNF for rehab. TOC was notified. CIR will sign off.   Clemens Catholic, [place], [place] Admissions Coordinator  [phone] ([place]) [phone] (office)

## 2021-02-25 NOTE — Progress Notes (Signed)
Physical Therapy Treatment Patient Details Name: Angel [name] MRN: [id number] DOB: [birth year] Today's Date: 02/25/2021   History of Present Illness Angel [name] is a 70 y.o. 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 CT showed R temporal and parietal SAH, SAH anterior frontal lobes  bilaterally. Also sustained right occipital skull fracture, temporal bone fx, right TM rupture, bilateral PE. Intubated 8/12, trach'd 8/29. CRRT 9/1- 9/9.  PMH: DM2, HTN    PT Comments    Pt continues to improve daily. Pt seen with OT and was able to tolerate a 45 minute treatment despite not having slept all night. Pt now beginning to be able to stand and march. Pt to continue to strongly benefit from CIR upon d/c to maximize functional recovery and to decrease burden of care on spouse upon transition home. Acute PT to cont to follow.    Recommendations for follow up therapy are one component of a multi-disciplinary discharge planning process, led by the attending physician.  Recommendations may be updated based on patient status, additional functional criteria and insurance authorization.  Follow Up Recommendations  CIR     Equipment Recommendations  Wheelchair (measurements PT);Wheelchair cushion (measurements PT);3in1 (PT);Rolling walker with 5" wheels (wide RW, BSC, 20x20 WC)    Recommendations for Other Services Rehab consult     Precautions / Restrictions Precautions Precautions: Fall Precaution Comments: trach collar, watch BP HR Restrictions Weight Bearing Restrictions: No     Mobility  Bed Mobility Overal bed mobility: Needs Assistance Bed Mobility: Rolling;Sidelying to Sit;Sit to Sidelying Rolling: Min guard (VCs for sequencing) Sidelying to sit: HOB elevated;Min assist Supine to sit: Mod assist     General bed mobility comments: minA for trunk elevation and for safety to stay on the bed as pt requiring strong use of momentum and push from L UE    Transfers Overall  transfer level: Needs assistance Equipment used: Ambulation equipment used Transfers: Sit to/from Stand Sit to Stand: Mod assist;+2 physical assistance (Min A from paddles) Stand pivot transfers: +2 physical assistance;From elevated surface (seated on standing frame for transfer)       General transfer comment: attempted several stands in stedy however pt reports "I just can't do it today, I"m so tired", pt complete 3 stands total, each less than a minute long  Ambulation/Gait             General Gait Details: pt attempted to march in place in stedy x2 however unable able to clear foot and pt quickly fatigued   Stairs             Wheelchair Mobility    Modified Rankin (Stroke Patients Only) Modified Rankin (Stroke Patients Only) Pre-Morbid Rankin Score: No symptoms Modified Rankin: Severe disability     Balance Overall balance assessment: Needs assistance Sitting-balance support: No upper extremity supported;Feet supported Sitting balance-Leahy Scale: Fair Sitting balance - Comments: leaning L at times; most likley affected by level of fatigue from not sleeping last night Postural control: Right lateral lean Standing balance support: Bilateral upper extremity supported Standing balance-Leahy Scale: Poor Standing balance comment: +2 Mod assist in standing.                            Cognition Arousal/Alertness: Awake/alert Behavior During Therapy: WFL for tasks assessed/performed Overall Cognitive Status: Impaired/Different from baseline Area of Impairment: Attention;Memory;Safety/judgement;Awareness;Problem solving               Rancho Levels  of Cognitive Functioning [employer] Scales of Cognitive Functioning: Purposeful/appropriate   Current Attention Level: Selective Memory: Decreased short-term memory Following Commands: Follows one step commands consistently Safety/Judgement: Decreased awareness of safety Awareness:  Emergent Problem Solving: Difficulty sequencing;Requires verbal cues;Requires tactile cues General Comments: despite being sleepy from not sleeping last night pt was interactive and did put forth effort t/o session      Exercises Other Exercises Other Exercises: sit - stand in stedy x 3 Other Exercises: attempted marching in place    General Comments General comments (skin integrity, edema, etc.): VSS      Pertinent Vitals/Pain Pain Assessment: Faces Faces Pain Scale: Hurts a little bit Pain Location: genealized Pain Descriptors / Indicators: Discomfort Pain Intervention(s): Monitored during session    Home Living                      Prior Function            PT Goals (current goals can now be found in the care plan section) Acute Rehab PT Goals Patient Stated Goal: to go to rehab PT Goal Formulation: With patient/family Time For Goal Achievement: 03/08/21 Potential to Achieve Goals: Good Progress towards PT goals: Progressing toward goals    Frequency    Min 3X/week      PT Plan Current plan remains appropriate    Co-evaluation PT/OT/SLP Co-Evaluation/Treatment: Yes Reason for Co-Treatment: For patient/therapist safety PT goals addressed during session: Mobility/safety with mobility OT goals addressed during session: ADL's and self-care;Strengthening/ROM      AM-PAC PT "6 Clicks" Mobility   Outcome Measure  Help needed turning from your back to your side while in a flat bed without using bedrails?: A Little Help needed moving from lying on your back to sitting on the side of a flat bed without using bedrails?: A Lot Help needed moving to and from a bed to a chair (including a wheelchair)?: A Lot Help needed standing up from a chair using your arms (e.g., wheelchair or bedside chair)?: A Lot Help needed to walk in hospital room?: Total Help needed climbing 3-5 steps with a railing? : Total 6 Click Score: 11    End of Session Equipment  Utilized During Treatment: Gait belt Activity Tolerance: Patient limited by fatigue Patient left: in chair;with call bell/phone within reach;with chair alarm set;with family/visitor present Nurse Communication: Mobility status PT Visit Diagnosis: Other abnormalities of gait and mobility (R26.89);Muscle weakness (generalized) (M62.81);Other symptoms and signs involving the nervous system (R29.898)     Time: 7035-0093 PT Time Calculation (min) (ACUTE ONLY): 40 min  Charges:  $Gait Training: 8-22 mins                     Kittie Plater, PT, DPT Acute Rehabilitation Services Pager #: [phone] Office #: [phone]    Berline Lopes 02/25/2021, 2:05 PM

## 2021-02-26 DIAGNOSIS — I2699 Other pulmonary embolism without acute cor pulmonale: Secondary | ICD-10-CM | POA: Diagnosis not present

## 2021-02-26 DIAGNOSIS — Z20822 Contact with and (suspected) exposure to covid-19: Secondary | ICD-10-CM | POA: Diagnosis not present

## 2021-02-26 DIAGNOSIS — S066X9A Traumatic subarachnoid hemorrhage with loss of consciousness of unspecified duration, initial encounter: Secondary | ICD-10-CM | POA: Diagnosis not present

## 2021-02-26 DIAGNOSIS — J9601 Acute respiratory failure with hypoxia: Secondary | ICD-10-CM | POA: Diagnosis not present

## 2021-02-26 LAB — GLUCOSE, CAPILLARY
Glucose-Capillary: 195 mg/dL — ABNORMAL HIGH (ref 70–99)
Glucose-Capillary: 127 mg/dL — ABNORMAL HIGH (ref 70–99)
Glucose-Capillary: 162 mg/dL — ABNORMAL HIGH (ref 70–99)
Glucose-Capillary: 213 mg/dL — ABNORMAL HIGH (ref 70–99)
Glucose-Capillary: 207 mg/dL — ABNORMAL HIGH (ref 70–99)

## 2021-02-26 LAB — RENAL FUNCTION PANEL
Albumin: 2.3 g/dL — ABNORMAL LOW (ref 3.5–5.0)
Anion gap: 6 (ref 5–15)
BUN: 16 mg/dL (ref 8–23)
CO2: 24 mmol/L (ref 22–32)
Calcium: 8.9 mg/dL (ref 8.9–10.3)
Chloride: 107 mmol/L (ref 98–111)
Creatinine, Ser: 1.01 mg/dL (ref 0.61–1.24)
GFR, Estimated: >60 mL/min (ref >60)
Glucose, Bld: 140 mg/dL — ABNORMAL HIGH (ref 70–99)
Phosphorus: 4 mg/dL (ref 2.5–4.6)
Potassium: 4.1 mmol/L (ref 3.5–5.1)
Sodium: 137 mmol/L (ref 135–145)

## 2021-02-26 MED ORDER — ENSURE ENLIVE PO LIQD
237.0000 mL | Freq: Two times a day (BID) | ORAL | Status: DC
  Administered 2021-02-26 – 2021-02-28 (×5): 237 mL via ORAL

## 2021-02-26 NOTE — Plan of Care (Signed)

## 2021-02-26 NOTE — Progress Notes (Signed)
Physical Therapy Treatment Patient Details Name: Angel [name] MRN: [id number] DOB: [birth year] Today's Date: 02/26/2021   History of Present Illness Angel [name] is a 70 y.o. 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 CT showed R temporal and parietal SAH, SAH anterior frontal lobes  bilaterally. Also sustained right occipital skull fracture, temporal bone fx, right TM rupture, bilateral PE. Intubated 8/12, trach'd 8/29. CRRT 9/1- 9/9.  Pt decannulated 02/26/21.  PMH: DM2, HTN    PT Comments    Pt is making daily progress in strength and mobility.  He was decannulated earlier today and appears to be tolerating it well.  He was better able to come to standing at EOB on stedy standing frame and even march in place.  We should start trying the RW again tomorrow to see if we can do a functional stand or even transfer with it.   PT to follow acutely for deficits listed below.     Recommendations for follow up therapy are one component of a multi-disciplinary discharge planning process, led by the attending physician.  Recommendations may be updated based on patient status, additional functional criteria and insurance authorization.  Follow Up Recommendations  SNF     Equipment Recommendations  Wheelchair cushion (measurements PT);Wheelchair (measurements PT);Hospital bed;3in1 (PT);Other (comment);Rolling walker with 5" wheels (wide equipment, 20x20 WC)    Recommendations for Other Services       Precautions / Restrictions Precautions Precautions: Fall Precaution Comments: watch BP, HR     Mobility  Bed Mobility Overal bed mobility: Needs Assistance Bed Mobility: Rolling;Sidelying to Sit Rolling: Supervision Sidelying to sit: Min assist;HOB elevated       General bed mobility comments: Pt able to roll and reach the rail without assist, needed assist to come up after letting him struggle for a minute to do so himself.    Transfers Overall transfer level: Needs  assistance Equipment used: Ambulation equipment used Transfers: Sit to/from [employer] Sit to Stand: Mod assist;Min assist;From elevated surface Stand pivot transfers: From elevated surface (seated on stedy)       General transfer comment: Light mod assist to come to standing EOB, min assist second and third times from stedy standing frame seat.  Sat on stedy to transfer.  Ambulation/Gait                 Stairs             Wheelchair Mobility    Modified Rankin (Stroke Patients Only)       Balance Overall balance assessment: Needs assistance Sitting-balance support: Feet supported;Bilateral upper extremity supported Sitting balance-Leahy Scale: Fair     Standing balance support: Bilateral upper extremity supported Standing balance-Leahy Scale: Poor Standing balance comment: needs support from stedy and therapist's light min assist in standing.  He stood for ~8 mins on his first stand working on upright posture, weight shifting, marching in place.  Second stand was ~3 mins due to fatigue, but much longer than even yesterday.                            Cognition Arousal/Alertness: Awake/alert Behavior During Therapy: WFL for tasks assessed/performed Overall Cognitive Status: Impaired/Different from baseline                 Rancho Levels of Cognitive Functioning [place] Scales of Cognitive Functioning: Purposeful/appropriate  General Comments: Admitted having several bouts of confusion at night and when woken from his sleep. Seeing things that are so vivid and then realizing they are not real.      Exercises General Exercises - Upper Extremity Shoulder Flexion: AROM;Both;10 reps Elbow Flexion: AROM;Both;10 reps General Exercises - Lower Extremity Ankle Circles/Pumps: AROM;Both;10 reps Long Arc Quad: AROM;Both;10 reps Hip Flexion/Marching: AROM;Both;10 reps    General Comments General  comments (skin integrity, edema, etc.): VSS throughout on RA, decannulated.      Pertinent Vitals/Pain Pain Assessment: No/denies pain    Home Living                      Prior Function            PT Goals (current goals can now be found in the care plan section) Acute Rehab PT Goals Patient Stated Goal: to go to rehab Progress towards PT goals: Progressing toward goals    Frequency    Min 2X/week (would continue to benefit from daily therapy)      PT Plan Discharge plan needs to be updated;Frequency needs to be updated    Co-evaluation              AM-PAC PT "6 Clicks" Mobility   Outcome Measure  Help needed turning from your back to your side while in a flat bed without using bedrails?: A Little Help needed moving from lying on your back to sitting on the side of a flat bed without using bedrails?: A Lot Help needed moving to and from a bed to a chair (including a wheelchair)?: A Lot Help needed standing up from a chair using your arms (e.g., wheelchair or bedside chair)?: A Lot Help needed to walk in hospital room?: Total Help needed climbing 3-5 steps with a railing? : Total 6 Click Score: 11    End of Session Equipment Utilized During Treatment: Gait belt Activity Tolerance: Patient limited by fatigue Patient left: in chair;with call bell/phone within reach;with family/visitor present   PT Visit Diagnosis: Other abnormalities of gait and mobility (R26.89);Muscle weakness (generalized) (M62.81);Other symptoms and signs involving the nervous system (R29.898)     Time: 4034-7425 PT Time Calculation (min) (ACUTE ONLY): 33 min  Charges:  $Therapeutic Exercise: 8-22 mins $Therapeutic Activity: 8-22 mins                     Verdene Lennert, PT, DPT  Acute Rehabilitation Ortho Tech Supervisor [phone] pager [phone]) [phone] office

## 2021-02-26 NOTE — Progress Notes (Signed)
Nutrition Follow-up  DOCUMENTATION CODES:   Obesity unspecified  INTERVENTION:   Encourage good PO intake  Ensure Enlive po BID, each supplement provides 350 kcal and 20 grams of protein  Discontinue Nepro    NUTRITION DIAGNOSIS:   Inadequate oral intake related to inability to eat as evidenced by NPO status. - Progressing, pt now on CM diet  GOAL:   Patient will meet greater than or equal to 90% of their needs - Progressing   MONITOR:   PO intake, Supplement acceptance, Labs, Weight trends, I & O's  REASON FOR ASSESSMENT:   Consult, Ventilator Enteral/tube feeding initiation and management  ASSESSMENT:   Pt with PMH of DM and HTN admitted after falling down basement stairs with TBI/SAH/SDH, significant frontal lobe injuries, occipital bone fx, temporal bone fx extending into middle ear, and R TM rupture.  8/15 - s/p Cortrak placement (tip gastric) 8/25 - pt vomited, OG placed with 650 ml out, abd tight  8/26 - extubated but required re-intubation 2 hours later, Cortrak coiled in pt's mouth post extubation and removed, NG tube placed with tip in distal stomach  8/27 - TF resumed 8/29 - s/p trach placement  8/31 - Cortrak placed (tip gastric) 9/01 - CRRT started 9/13 - CRRT stopped 9/14 - first iHD 9/19 - tunneled catheter placed for long-term iHD 9/21 - FEES, diet advanced to dysphagia 2 with thin liquid 9/22 - transitioned to nocturnal TF 9/27 - nocturnal TF d/c, Cortrak removed 9/28 - diet advanced to Carb Modified 10/11 - Trach removed  Per pt, his appetite is back and is doing great. Per pt and wife, he has ate 100% of his breakfast this morning. Was drinking Nepro shakes and enjoys them but the unit has been out. Per nurse, she ordered more and is waiting to get them from pharmacy. Discussed switching pt off of Nepro onto a different supplement that is more appropriate to his needs. Pt agreeable to alternative supplements.  Per EMR, pt intake includes:   10/6: Dinner 70% 10/7: Breakfast 100%, Lunch 50% 10/8: Breakfast 100%, Lunch 40% 10/9: Breakfast 50%, Lunch 75%  Pt eager to get home and see grandchildren. Currently pending SNF placement, pt denied from CIR at this time.   Medications reviewed and include: Eliquis, Vitamin C, Colace, Ferrous Sulfate, SSI 0-15 units w/ meals + 16 units BID, Protonix, Senna Labs reviewed: 24 hr BG trends 101-195 mg/dL  Admission Weight: 133.8 kg Current Weight: 117 kg  Diet Order:   Diet Order             Diet Carb Modified Fluid consistency: Thin; Room service appropriate? Yes  Diet effective now                   EDUCATION NEEDS:   No education needs have been identified at this time  Skin:  Skin Assessment: Reviewed RN Assessment  Last BM:  02/23/2021  Height:   Ht Readings from Last 1 Encounters:  01/26/21 6\' 2"  (1.88 m)    Weight:   Wt Readings from Last 1 Encounters:  02/26/21 117 kg    Ideal Body Weight:  86.4 kg  BMI:  Body mass index is 33.13 kg/m.  Estimated Nutritional Needs:   Kcal:  2500-2700  Protein:  130-150 grams  Fluid:  > 2 L/day    Hermina Barters BS, PLDN Clinical Dietitian See [facility] for contact information.

## 2021-02-26 NOTE — Plan of Care (Signed)
  Problem: Education: Goal: Knowledge of General Education information will improve Outcome: Progressing   Problem: Health Behavior/Discharge Planning: Goal: Ability to manage health-related needs will improve Outcome: Progressing   Problem: Clinical Measurements: Goal: Respiratory complications will improve Outcome: Progressing Goal: Cardiovascular complication will be avoided Outcome: Progressing   Problem: Coping: Goal: Level of anxiety will decrease Outcome: Progressing

## 2021-02-26 NOTE — Progress Notes (Signed)
Speech Language Pathology Treatment: Cognitive-Linquistic  Patient Details Name: Angel [name] MRN: [id number] DOB: 1951-01-30 Today's Date: 02/26/2021 Time: 2458-0998 SLP Time Calculation (min) (ACUTE ONLY): 18 min  Assessment / Plan / Recommendation Clinical Impression  Pt decannulated today and pleased with progress. Participated in tasks focusing on prospective and short-term memory, verbal sequencing.  Recalled hypothetical list of grocery store items with 100% accuracy for trials of three, four, then five items.  Engaged in prospective recall task with 100% accuracy. Performed verbal sequencing tasks with excellent organization and attention to detail. Pt is making marked gains with cognitive function.  Continue SLP.   HPI HPI: PRIEST LOCKRIDGE is a 70 y.o. 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 CT showed R temporal and parietal SAH, SAH anterior frontal lobes  bilaterally. Also sustained right occipital skull fracture, temporal bone fx, right TM rupture, bilateral PE. Intubated 8/12, trach'd 8/29, decannulated 10/11. PMH: DM2, HTN      SLP Plan  Continue with current plan of care      Recommendations for follow up therapy are one component of a multi-disciplinary discharge planning process, led by the attending physician.  Recommendations may be updated based on patient status, additional functional criteria and insurance authorization.    Recommendations  Diet recommendations: Regular;Thin liquid      Patient may use Passy-Muir Speech Valve:  (n/a)         Oral Care Recommendations: Oral care BID Follow up Recommendations: Inpatient Rehab SLP Visit Diagnosis: Cognitive communication deficit (P38.250) Plan: Continue with current plan of care       GO               Raelie Lohr L. Tivis Ringer, [place] CCC/SLP Acute Rehabilitation Services Office number [phone] Pager [phone]  Juan Quam Laurice  02/26/2021, 3:47 PM

## 2021-02-26 NOTE — Progress Notes (Signed)
Dr Bobbye Morton removed patient's trach and patient is tolerating well at this time.

## 2021-02-26 NOTE — NC FL2 (Addendum)
[place] LEVEL OF CARE SCREENING TOOL     IDENTIFICATION  Patient Name: Angel [name] Birthdate: 03-07-1951 Sex: male Admission Date (Current Location): 12/28/2020  [facility] and [place] Number:  Herbalist and Address:  [facility], [place] [address] [place], [place] [address]      Provider Number: [id number]  Attending Physician Name and Address:  Md, Trauma, MD  Relative Name and Phone Number:  Rodgerick Gilliand, wife;[phone] [facility])    Current Level of Care: Hospital Recommended Level of Care: [place] Prior Approval Number:    Date Approved/Denied:   PASRR Number: [id number]  Discharge Plan: SNF    Current Diagnoses: Patient Active Problem List   Diagnosis Date Noted   Respiratory failure [facility])    Atrial fibrillation with rapid ventricular response ([place])    Primary hypertension    SAH (subarachnoid hemorrhage) ([place]) 12/28/2020    Orientation RESPIRATION BLADDER Height & Weight        Normal (decannulated on 02/26/21) Incontinent Weight: 117 kg Height:  6\' 2"  (188 cm)  BEHAVIORAL SYMPTOMS/MOOD NEUROLOGICAL BOWEL NUTRITION STATUS      Incontinent Diet (carb modified fluid thin)  AMBULATORY STATUS COMMUNICATION OF NEEDS Skin   Extensive Assist Verbally Normal                       Personal Care Assistance Level of Assistance  Bathing, Feeding, Dressing Bathing Assistance: Maximum assistance Feeding assistance: Limited assistance Dressing Assistance: Maximum assistance     Functional Limitations Info             SPECIAL CARE FACTORS FREQUENCY  PT (By licensed PT), OT (By licensed OT)     PT Frequency: 5x weekly OT Frequency: 5x weekly            Contractures Contractures Info: Not present    Additional Factors Info  Code Status, Allergies Code Status Info: Full Allergies Info: NKDA           Current Medications (02/26/2021):  This is the current hospital active  medication list Current Facility-Administered Medications  Medication Dose Route Frequency Provider Last Rate Last Admin   acetaminophen (TYLENOL) tablet 1,000 mg  1,000 mg Oral Q6H Georganna Skeans, MD   1,000 mg at 02/26/21 1301   ALPRAZolam (XANAX) tablet 0.25 mg  0.25 mg Oral BID PRN Georganna Skeans, MD   0.25 mg at 02/24/21 2206   amiodarone (PACERONE) tablet 200 mg  200 mg Oral Daily Donato Heinz, MD   200 mg at 02/26/21 1012   apixaban (ELIQUIS) tablet 5 mg  5 mg Oral BID Georganna Skeans, MD   5 mg at 02/26/21 1012   artificial tears (LACRILUBE) ophthalmic ointment   Both Eyes PRN Jesusita Oka, MD   1 application at 11/27/17 1724   ascorbic acid (VITAMIN C) tablet 500 mg  500 mg Oral BID Meuth, Brooke A, PA-C   500 mg at 02/26/21 1012   bisacodyl (DULCOLAX) suppository 10 mg  10 mg Rectal Daily PRN Meuth, Brooke A, PA-C   10 mg at 02/18/21 0959   chlorhexidine (PERIDEX) 0.12 % solution 15 mL  15 mL Mouth Rinse BID Georganna Skeans, MD   15 mL at 02/26/21 1011   docusate sodium (COLACE) capsule 100 mg  100 mg Oral BID Georganna Skeans, MD   100 mg at 02/25/21 2149   feeding supplement (ENSURE ENLIVE / ENSURE PLUS) liquid 237 mL  237  mL Oral BID BM Jesusita Oka, MD   237 mL at 02/26/21 1455   ferrous sulfate tablet 325 mg  325 mg Oral BID WC Meuth, Brooke A, PA-C   325 mg at 02/26/21 1012   gabapentin (NEURONTIN) capsule 300 mg  300 mg Oral BID Kinsinger, Arta Bruce, MD   300 mg at 02/26/21 1012   guaiFENesin (ROBITUSSIN) 100 MG/5ML solution 300 mg  15 mL Oral Q4H Georganna Skeans, MD   300 mg at 02/26/21 1453   hydrALAZINE (APRESOLINE) injection 10 mg  10 mg Intravenous Q4H PRN Jesusita Oka, MD   10 mg at 01/24/21 1634   HYDROmorphone (DILAUDID) injection 0.5-1 mg  0.5-1 mg Intravenous Q2H PRN Georganna Skeans, MD   1 mg at 01/30/21 0521   insulin aspart (novoLOG) injection 0-15 Units  0-15 Units Subcutaneous TID WC & HS Jillyn Ledger, PA-C   3 Units at 02/26/21 1301    insulin glargine-yfgn (SEMGLEE) injection 16 Units  16 Units Subcutaneous BID Margie Billet A, PA-C   16 Units at 02/26/21 1011   ipratropium-albuterol (DUONEB) 0.5-2.5 (3) MG/3ML nebulizer solution 3 mL  3 mL Nebulization Q6H PRN Meuth, Brooke A, PA-C   3 mL at 02/24/21 2336   MEDLINE mouth rinse  15 mL Mouth Rinse q12n4p Georganna Skeans, MD   15 mL at 02/25/21 1331   methocarbamol (ROBAXIN) tablet 1,000 mg  1,000 mg Oral Q8H Georganna Skeans, MD   1,000 mg at 02/26/21 1454   metoprolol tartrate (LOPRESSOR) tablet 12.5 mg  12.5 mg Oral BID Meuth, Brooke A, PA-C   12.5 mg at 02/26/21 1011   ondansetron (ZOFRAN-ODT) disintegrating tablet 4 mg  4 mg Oral Q6H PRN Jesusita Oka, MD       Or   ondansetron (ZOFRAN) injection 4 mg  4 mg Intravenous Q6H PRN Jesusita Oka, MD   4 mg at 02/24/21 1940   oxyCODONE (Oxy IR/ROXICODONE) immediate release tablet 10-15 mg  10-15 mg Oral Q4H PRN Georganna Skeans, MD   10 mg at 02/26/21 1023   pantoprazole (PROTONIX) EC tablet 40 mg  40 mg Oral Daily Georganna Skeans, MD   40 mg at 02/26/21 1012   polyethylene glycol (MIRALAX / GLYCOLAX) packet 17 g  17 g Oral Daily Jesusita Oka, MD   17 g at 02/23/21 1027   QUEtiapine (SEROQUEL) tablet 25 mg  25 mg Oral QHS Georganna Skeans, MD   25 mg at 02/25/21 2149   senna (SENOKOT) tablet 8.6 mg  1 tablet Oral Daily Jesusita Oka, MD   8.6 mg at 02/23/21 1024   Facility-Administered Medications Ordered in Other Encounters  Medication Dose Route Frequency Provider Last Rate Last Admin   lidocaine (cardiac) 100 mg/61mL (XYLOCAINE) injection 2%   Intravenous Anesthesia Intra-op Viann Fish B, CRNA   100 mg at 01/11/21 1439   succinylcholine (ANECTINE) syringe   Intravenous Anesthesia Intra-op Viann Fish B, CRNA   140 mg at 01/11/21 1439     Discharge Medications: Please see discharge summary for a list of discharge medications.  Relevant Imaging Results:  Relevant Lab Results:   Additional Information SS  # [id number]  Ella Bodo, RN

## 2021-02-26 NOTE — Progress Notes (Signed)
Trauma/Critical Care Follow Up Note  Subjective:    Overnight Issues:   Objective:  Vital signs for last 24 hours: Temp:  [97.9 F (36.6 C)-98.5 F (36.9 C)] 97.9 F (36.6 C) (10/11 1117) Pulse Rate:  [54-66] 66 (10/11 1117) Resp:  [11-20] 17 (10/11 1117) BP: (100-129)/(61-72) 110/65 (10/11 1117) SpO2:  [96 %-100 %] 100 % (10/11 1117) FiO2 (%):  [21 %-28 %] 21 % (10/11 0842) Weight:  [332 kg] 117 kg (10/11 0600)  Hemodynamic parameters for last 24 hours:    Intake/Output from previous day: 10/10 0701 - 10/11 0700 In: 477 [P.O.:240; NG/GT:237] Out: -   Intake/Output this shift: No intake/output data recorded.  Vent settings for last 24 hours: FiO2 (%):  [21 %-28 %] 21 %  Physical Exam:  Gen: comfortable, no distress Neuro: non-focal exam HEENT: PERRL Neck: supple CV: RRR Pulm: unlabored breathing Abd: soft, NT GU: clear yellow urine Extr: wwp, no edema   Results for orders placed or performed during the hospital encounter of 12/28/20 (from the past 24 hour(s))  Glucose, capillary     Status: Abnormal   Collection Time: 02/25/21 11:48 AM  Result Value Ref Range   Glucose-Capillary 147 (H) 70 - 99 mg/dL  Glucose, capillary     Status: Abnormal   Collection Time: 02/25/21  4:44 PM  Result Value Ref Range   Glucose-Capillary 195 (H) 70 - 99 mg/dL   Comment 1 Notify RN    Comment 2 Document in Chart   Glucose, capillary     Status: Abnormal   Collection Time: 02/25/21  8:22 PM  Result Value Ref Range   Glucose-Capillary 164 (H) 70 - 99 mg/dL  Renal function panel     Status: Abnormal   Collection Time: 02/26/21  1:26 AM  Result Value Ref Range   Sodium 137 135 - 145 mmol/L   Potassium 4.1 3.5 - 5.1 mmol/L   Chloride 107 98 - 111 mmol/L   CO2 24 22 - 32 mmol/L   Glucose, Bld 140 (H) 70 - 99 mg/dL   BUN 16 8 - 23 mg/dL   Creatinine, Ser 1.01 0.61 - 1.24 mg/dL   Calcium 8.9 8.9 - 10.3 mg/dL   Phosphorus 4.0 2.5 - 4.6 mg/dL   Albumin 2.3 (L) 3.5 - 5.0  g/dL   GFR, Estimated >60 >60 mL/min   Anion gap 6 5 - 15  Glucose, capillary     Status: Abnormal   Collection Time: 02/26/21  7:48 AM  Result Value Ref Range   Glucose-Capillary 127 (H) 70 - 99 mg/dL    Assessment & Plan:  Present on Admission: **None**    LOS: 60 days   Additional comments:I reviewed the patient's new clinical lab test results.   and I reviewed the patients new imaging test results.    Fall down stairs 8/12  VDRF - guaifenisen, S/P trach 8/29 by Dr. Bobbye Morton. Has tolerated HTC well, passy muir trials. Trach changed to cuffless #6 on 9/20. There is no XLT #4 cuffless, so downsized to standard #4CL 10/5. Decannulated today. TBI/SAH/SDH - NSGY c/s, Dr. Annette Stable. Significant frontal lobe injuries. Keppra x7d for sz ppx (completed) Occipital bone fx - NSGY c/s, Dr. Annette Stable Temporal bone fx extending into middle ear - ENT c/s, Dr. Constance Holster, no acute treatment, will need re-eval hearing and facial nerve  Right TM Rupture - ENT c/s, Dr. Constance Holster [facility] -  cardiology s/o 9/23 with recs: metoprolol 25mg  BID, amiodarone 200 mg BID x1 week,  then decrease to 200 mg daily. Decreased Metoprolol 12.5mg  BID on 10/3 due to hypotension, orthostasis has resolved. ABL anemia- 1u PRBC 9/19, hgb stable. Continue Iron and vitamin c Bilateral pulmonary embolism - Eliquis Hx DM2 - SSI. Decreased semglee to 16U BID 9/28. Appreciate DM coordinator assistance  Hx HTN - PRN meds FEN - Reg diet VTE - SCDs, Eliquis Dispo - plan SNF    Jesusita Oka, MD Trauma & General Surgery Please use [email or web address] to contact on call provider  02/26/2021  *Care during the described time interval was provided by me. I have reviewed this patient's available data, including medical history, events of note, physical examination and test results as part of my evaluation.

## 2021-02-27 DIAGNOSIS — J9601 Acute respiratory failure with hypoxia: Secondary | ICD-10-CM | POA: Diagnosis not present

## 2021-02-27 DIAGNOSIS — I2699 Other pulmonary embolism without acute cor pulmonale: Secondary | ICD-10-CM | POA: Diagnosis not present

## 2021-02-27 DIAGNOSIS — Z20822 Contact with and (suspected) exposure to covid-19: Secondary | ICD-10-CM | POA: Diagnosis not present

## 2021-02-27 DIAGNOSIS — S066X9A Traumatic subarachnoid hemorrhage with loss of consciousness of unspecified duration, initial encounter: Secondary | ICD-10-CM | POA: Diagnosis not present

## 2021-02-27 LAB — GLUCOSE, CAPILLARY
Glucose-Capillary: 99 mg/dL (ref 70–99)
Glucose-Capillary: 169 mg/dL — ABNORMAL HIGH (ref 70–99)
Glucose-Capillary: 225 mg/dL — ABNORMAL HIGH (ref 70–99)
Glucose-Capillary: 243 mg/dL — ABNORMAL HIGH (ref 70–99)
Glucose-Capillary: 196 mg/dL — ABNORMAL HIGH (ref 70–99)

## 2021-02-27 LAB — RENAL FUNCTION PANEL
Albumin: 2.3 g/dL — ABNORMAL LOW (ref 3.5–5.0)
Anion gap: 8 (ref 5–15)
BUN: 19 mg/dL (ref 8–23)
CO2: 23 mmol/L (ref 22–32)
Calcium: 9 mg/dL (ref 8.9–10.3)
Chloride: 107 mmol/L (ref 98–111)
Creatinine, Ser: 1.12 mg/dL (ref 0.61–1.24)
GFR, Estimated: >60 mL/min (ref >60)
Glucose, Bld: 93 mg/dL (ref 70–99)
Phosphorus: 3.7 mg/dL (ref 2.5–4.6)
Potassium: 3.9 mmol/L (ref 3.5–5.1)
Sodium: 138 mmol/L (ref 135–145)

## 2021-02-27 NOTE — Progress Notes (Signed)
Patient ID: Angel [name], male   DOB: Jan 25, 1951, 70 y.o.   MRN: [id number] 44 Days Post-Op   Subjective: Decannulated yesterday with no issues.  Breathing well today and denies SOB.    ROS negative except as listed above. Objective: Vital signs in last 24 hours: Temp:  [97.6 F (36.4 C)-99 F (37.2 C)] 98.4 F (36.9 C) (10/12 0730) Pulse Rate:  [57-75] 75 (10/12 0730) Resp:  [14-20] 20 (10/12 0730) BP: (100-135)/(56-80) 135/56 (10/12 0730) SpO2:  [94 %-100 %] 96 % (10/12 0730) Weight:  [116.1 kg] 116.1 kg (10/12 0600) Last BM Date: 02/23/21  Intake/Output from previous day: 10/11 0701 - 10/12 0700 In: -  Out: 300 [Urine:300] Intake/Output this shift: No intake/output data recorded.  Gen:  Alert, NAD HEENT: EOM's intact, pupils equal and round. Decannulated.  Site is clean and covered with gauze Card:  RRR, no M/G/R heard, palpable pedal pulses Pulm:  CTAB, no W/R/R, rate and effort normal Abd: Soft, protuberant, nontender, +BS Ext:  calves soft and nontender Neuro: MAEs, follows commands Skin: no rashes noted, warm and dry  Lab Results: CBC  No results for input(s): WBC, HGB, HCT, PLT in the last 72 hours. BMET Recent Labs    02/26/21 0126 02/27/21 0317  NA 137 138  K 4.1 3.9  CL 107 107  CO2 24 23  GLUCOSE 140* 93  BUN 16 19  CREATININE 1.01 1.12  CALCIUM 8.9 9.0   PT/INR No results for input(s): LABPROT, INR in the last 72 hours. ABG No results for input(s): PHART, HCO3 in the last 72 hours.  Invalid input(s): PCO2, PO2  Studies/Results: No results found.  Anti-infectives: Anti-infectives (From admission, onward)    Start     Dose/Rate Route Frequency Ordered Stop   02/04/21 1548  ceFAZolin (ANCEF) IVPB 2g/100 mL premix  Status:  Discontinued        over 30 Minutes  Continuous PRN 02/04/21 1549 02/09/21 1305   02/04/21 1545  ceFAZolin (ANCEF) IVPB 1 g/50 mL premix        1 g 100 mL/hr over 30 Minutes Intravenous  Once 02/04/21 1458 02/04/21 1600    02/04/21 1543  ceFAZolin (ANCEF) 2-4 GM/100ML-% IVPB       Note to Pharmacy: [place]   : cabinet override      02/04/21 1543 02/04/21 1651   01/11/21 2330  ceFEPIme (MAXIPIME) 2 g in sodium chloride 0.9 % 100 mL IVPB  Status:  Discontinued        2 g 200 mL/hr over 30 Minutes Intravenous Every 24 hours 01/11/21 0711 01/16/21 0907   01/10/21 1645  ampicillin (OMNIPEN) 2 g in sodium chloride 0.9 % 100 mL IVPB  Status:  Discontinued        2 g 300 mL/hr over 20 Minutes Intravenous Every 8 hours 01/10/21 1549 01/16/21 0907   01/09/21 2200  ceFEPIme (MAXIPIME) 2 g in sodium chloride 0.9 % 100 mL IVPB  Status:  Discontinued        2 g 200 mL/hr over 30 Minutes Intravenous Every 12 hours 01/09/21 1458 01/11/21 0711   01/08/21 1515  metroNIDAZOLE (FLAGYL) IVPB 500 mg  Status:  Discontinued        500 mg 100 mL/hr over 60 Minutes Intravenous Every 8 hours 01/08/21 1428 01/10/21 1618   01/03/21 0600  vancomycin (VANCOREADY) IVPB 1250 mg/250 mL  Status:  Discontinued        1,250 mg 166.7 mL/hr over 90 Minutes Intravenous Every  12 hours 01/02/21 1717 01/03/21 0837   01/02/21 1800  vancomycin (VANCOREADY) IVPB 2000 mg/400 mL        2,000 mg 200 mL/hr over 120 Minutes Intravenous  Once 01/02/21 1712 01/02/21 2007   01/02/21 0900  ceFEPIme (MAXIPIME) 2 g in sodium chloride 0.9 % 100 mL IVPB  Status:  Discontinued        2 g 200 mL/hr over 30 Minutes Intravenous Every 8 hours 01/02/21 0849 01/09/21 1458       Assessment/Plan: Fall down stairs 8/12 VDRF - guaifenisen, S/P trach 8/29 by Dr. Bobbye Morton. Has tolerated HTC well, passy muir trials. Trach changed to cuffless #6 on 9/20. There is no XLT #4 cuffless, so downsized to standard #4CL 10/5. Decannulated on 10/11 and doing well TBI/SAH/SDH - NSGY c/s, Dr. Annette Stable. Significant frontal lobe injuries. Keppra x7d for sz ppx (completed) Occipital bone fx - NSGY c/s, Dr. Annette Stable Temporal bone fx extending into middle ear - ENT c/s, Dr. Constance Holster, no acute  treatment, will need re-eval hearing and facial nerve  Right TM Rupture - ENT c/s, Dr. Constance Holster [facility] -  cardiology s/o 9/23 with recs: metoprolol 25mg  BID, amiodarone 200 mg BID x1 week, then decrease to 200 mg daily. Decreased Metoprolol 12.5mg  BID on 10/3 due to hypotension, orthostasis has resolved. ABL anemia- 1u PRBC 9/19, hgb stable. Continue Iron and vitamin c Bilateral pulmonary embolism - Eliquis AKI - Resolved. GFR > 60. Nephrology reports no further need for dialysis. Tunneled catheter out Hx DM2 - SSI. Decreased semglee to 16U BID 9/28. Appreciate DM coordinator assistance  Hx HTN - PRN meds FEN - Reg diet; hypokalemia 10/9 - replace potassium VTE - SCDs, Eliquis ID - off abx, no fevers Dispo - insurance denied CIR despite peer to peer. Will plan SNF.    LOS: 61 days   [name], PA-C General Surgery and Trauma See amion for on call provider and page number    02/27/2021

## 2021-02-27 NOTE — Progress Notes (Signed)
Occupational Therapy Treatment Patient Details Name: Angel [name] MRN: [id number] DOB: 1951/04/14 Today's Date: 02/27/2021   History of present illness Angel [name] is a 70 y.o. 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 CT showed R temporal and parietal SAH, SAH anterior frontal lobes  bilaterally. Also sustained right occipital skull fracture, temporal bone fx, right TM rupture, bilateral PE. Intubated 8/12, trach'd 8/29. CRRT 9/1- 9/9.  Pt decannulated 02/26/21.  PMH: DM2, HTN   OT comments  Excellent session again today- making significant gains this week. Goals met/updated. Pt tolerated 47 min session on RA following mobility session with PT with VSS. Focus of session was on ADL retraining. Pt able to complete bathing with set up for UB and mod A for LB for periarea. Able to lean forward to doff socks with S. Able to stand with mod A +2 for safety. After ADL session, pt participated in [place] activities. Supportive wife present and verbalized understanding of how to assist with ADL tasks at seated level to encourage independence after prolonged illness. Angel [name] is an excellent candidate for CIR. Feel he will tolerate and progress with intensive therapy to facilitate safe DC home with supportive family. Will continue to follow acutely.    Recommendations for follow up therapy are one component of a multi-disciplinary discharge planning process, led by the attending physician.  Recommendations may be updated based on patient status, additional functional criteria and insurance authorization.    Follow Up Recommendations  CIR    Equipment Recommendations  3 in 1 bedside commode    Recommendations for Other Services Rehab consult    Precautions / Restrictions Precautions Precautions: Fall Restrictions Weight Bearing Restrictions: No       Mobility Bed Mobility Overal bed mobility: Needs Assistance Bed Mobility: Rolling;Sidelying to Sit Rolling: Min assist Sidelying  to sit: Min assist;HOB elevated       General bed mobility comments: OOB in chair    Transfers Overall transfer level: Needs assistance   Transfers: Sit to/from Stand Sit to Stand: Mod assist;+2 safety/equipment              Balance     Sitting balance-Leahy Scale: Good Sitting balance - Comments: able to propr foot on side of trashcan to lean down to help doff socks duet o body habitus; transitioning form forward bedn to upright with S without any LOB     Standing balance-Leahy Scale: Poor                             ADL either performed or assessed with clinical judgement   ADL Overall ADL's : Needs assistance/impaired Eating/Feeding: Set up   Grooming: Set up;Sitting   Upper Body Bathing: Supervision/ safety;Set up;Sitting   Lower Body Bathing: Moderate assistance;Sitting/lateral leans   Upper Body Dressing : Set up;Supervision/safety;Sitting   Lower Body Dressing: Moderate assistance;Sitting/lateral leans               Functional mobility during ADLs:  (Able to push up from recliner to clear bottom from chair. Mod A to stand upright with RW) General ADL Comments: Completed ADL task with music playing. Able to selectively attend to task; wife enterred during session and observed how to encourage independence/work on strengthening during task     Vision       Perception     Praxis      Cognition Arousal/Alertness: Awake/alert Behavior During Therapy: [facility] for tasks assessed/performed  Overall Cognitive Status: Impaired/Different from baseline Area of Impairment: Orientation;Attention;Memory;Safety/judgement;Problem solving               [place] Levels of Cognitive Functioning [place] Scales of Cognitive Functioning: Purposeful/appropriate Orientation Level:  (aware he hasn't seen his grandkids since august but couldnt remember the dates) Current Attention Level: Selective Memory: Decreased short-term memory Following  Commands: Follows multi-step commands with increased time Safety/Judgement: Decreased awareness of deficits Awareness: Emergent Problem Solving: Requires verbal cues;Requires tactile cues;Decreased initiation;Slow processing General Comments: completed ADL task in moderately distracting environemtn        Exercises General Exercises - Upper Extremity Shoulder Flexion: AAROM;AROM;Both;15 reps;Seated Shoulder ABduction: AROM;AAROM;Both;15 reps;Seated Elbow Flexion: AROM;Strengthening;Both;10 reps;Seated;Theraband Theraband Level (Elbow Flexion): Level 1 (Yellow) Elbow Extension: AROM;Strengthening;Both;10 reps;Seated;Theraband Theraband Level (Elbow Extension): Level 1 (Yellow) Other Exercises Other Exercises: lower trap strengthening with level 1 theraband x 10 - pullbacks   Shoulder Instructions       General Comments      Pertinent Vitals/ Pain       Pain Assessment: No/denies pain  Home Living                                          Prior Functioning/Environment              Frequency  Min 2X/week        Progress Toward Goals  OT Goals(current goals can now be found in the care plan section)  Progress towards OT goals: Goals met and updated - see care plan  Acute Rehab OT Goals Patient Stated Goal: to go to rehab so he can go home and see his grandkids OT Goal Formulation: With patient/family Time For Goal Achievement: 03/15/21 Potential to Achieve Goals: Good ADL Goals Pt Will Perform Grooming:  (goal met 10/12) Pt Will Perform Upper Body Bathing: with modified independence;sitting Pt Will Perform Lower Body Bathing: with supervision;sit to/from stand;with set-up Pt Will Perform Upper Body Dressing: with modified independence;sitting Pt Will Perform Lower Body Dressing: with min assist;sitting/lateral leans;sit to/from stand Pt Will Transfer to Toilet: with mod assist;with +2 assist;bedside commode Pt Will Perform Toileting - Clothing  Manipulation and hygiene: with mod assist;sitting/lateral leans;sit to/from stand Additional ADL Goal #1: Pt will demonstrate anticipatory awareness during ADL tasks in moderately distracting environment Additional ADL Goal #2:  (goal met 10/12) Additional ADL Goal #3:  (goal met 10/12)  Plan Discharge plan remains appropriate    Co-evaluation                 AM-PAC OT "6 Clicks" Daily Activity     Outcome Measure   Help from another person eating meals?: A Little Help from another person taking care of personal grooming?: A Little Help from another person toileting, which includes using toliet, bedpan, or urinal?: A Lot Help from another person bathing (including washing, rinsing, drying)?: A Lot Help from another person to put on and taking off regular upper body clothing?: A Little Help from another person to put on and taking off regular lower body clothing?: A Lot 6 Click Score: 15    End of Session Equipment Utilized During Treatment: Gait belt  OT Visit Diagnosis: Unsteadiness on feet (R26.81);Other abnormalities of gait and mobility (R26.89);Muscle weakness (generalized) (M62.81);Other symptoms and signs involving cognitive function;Pain   Activity Tolerance Patient tolerated treatment well   Patient Left in chair;with call bell/phone  within reach;with chair alarm set;with family/visitor present   Nurse Communication Mobility status        Time: 1015-1102 OT Time Calculation (min): 47 min  Charges: OT General Charges $OT Visit: 1 Visit OT Treatments $Self Care/Home Management : 23-37 mins $Therapeutic Exercise: 8-22 mins  Maurie Boettcher, OT/L   Acute OT Clinical Specialist Acute Rehabilitation Services Pager [phone] Office [phone]   [facility] 02/27/2021, 11:42 AM

## 2021-02-27 NOTE — Progress Notes (Signed)
Physical Therapy Treatment Patient Details Name: Angel [name] MRN: [id number] DOB: [birth year] Today's Date: 02/27/2021   History of Present Illness Angel [name] is a 70 y.o. 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 CT showed R temporal and parietal SAH, SAH anterior frontal lobes  bilaterally. Also sustained right occipital skull fracture, temporal bone fx, right TM rupture, bilateral PE. Intubated 8/12, trach'd 8/29. CRRT 9/1- 9/9.  Pt decannulated 02/26/21.  PMH: DM2, HTN    PT Comments    Pt with significant improvement today. Pt awake and feeding him self breakfast with adaptive spoon and noted to have decreased R UE tremor compared to Monday's session. Pt able to stand up to RW and complete std pvt to chair using RW today for the first time with modAx2. No stedy needed today. Pt able to tolerate 30 min of treatment including seated LE exercises as EOB with manual resistance, sit to stand training, marching in place in walker and then std pvt to chair. Pt with significant improvement in activity tolerance and endurance. Pt does remain to have some memory deficits, impaired sequencing and delayed processing with decreased insight to deficits. Pt is an excellent candidate for CIR as he demonstrates excellent rehab potential and has significantly improved over the last week. OT is planning on having a second session today. Acute PT to cont to follow.   Recommendations for follow up therapy are one component of a multi-disciplinary discharge planning process, led by the attending physician.  Recommendations may be updated based on patient status, additional functional criteria and insurance authorization.  Follow Up Recommendations  CIR     Equipment Recommendations  Wheelchair cushion (measurements PT);Wheelchair (measurements PT);Hospital bed;3in1 (PT);Other (comment);Rolling walker with 5" wheels    Recommendations for Other Services Rehab consult     Precautions /  Restrictions Precautions Precautions: Fall Restrictions Weight Bearing Restrictions: No     Mobility  Bed Mobility Overal bed mobility: Needs Assistance Bed Mobility: Rolling;Sidelying to Sit Rolling: Min assist Sidelying to sit: Min assist;HOB elevated       General bed mobility comments: max directional step by step cues to complete task, minA for trunk elevation, tactile cues for hand placement, pt able to bring LEs off EOB    Transfers Overall transfer level: Needs assistance Equipment used: Rolling walker (2 wheeled) Transfers: Sit to/from Stand Sit to Stand: Mod assist;+2 safety/equipment Stand pivot transfers: Mod assist;+2 physical assistance       General transfer comment: pt completed 3 sit to stand from elevated bed to RW with modA to block knees and to aid in power up and steady during transition of hands from bed to RW. pt completed 10 steps in place, pt needing tactile cues to weight shift to clear foot. s/p seated rest break pt then completed std pvt transfer to chair with modAx2 for walker management, weight shifting and sequencing stepping. pt able to clear R and L foot, max directional cues given  Ambulation/Gait             General Gait Details: pt tolerated std pvt well in addition to marching in place   Stairs             Wheelchair Mobility    Modified Rankin (Stroke Patients Only) Modified Rankin (Stroke Patients Only) Pre-Morbid Rankin Score: No symptoms Modified Rankin: Moderately severe disability     Balance Overall balance assessment: Needs assistance Sitting-balance support: Feet supported;Bilateral upper extremity supported Sitting balance-Leahy Scale: Good Sitting balance -  Comments: pt able to support self with bilat UEs   Standing balance support: Bilateral upper extremity supported Standing balance-Leahy Scale: Poor Standing balance comment: dependent on physical assist and RW to maintain standing                             Cognition Arousal/Alertness: Awake/alert Behavior During Therapy: WFL for tasks assessed/performed Overall Cognitive Status: Impaired/Different from baseline Area of Impairment: Orientation;Attention;Memory;Safety/judgement;Problem solving               Rancho Levels of Cognitive Functioning Rancho Los Amigos Scales of Cognitive Functioning: Purposeful/appropriate Orientation Level:  (aware he hasn't seen his grandkids since august but couldnt remember the dates) Current Attention Level: Sustained Memory: Decreased short-term memory (didn't recall seeing therapy 45 min prior to OT coming in) Following Commands: Follows one step commands with increased time Safety/Judgement: Decreased awareness of safety;Decreased awareness of deficits (pt feels that he can go home for a couple of days and then come to rehab, but also knows that his wife can't assist him but reports "my son Gaspar Bidding has 5 weeks vacation, he can take that and help me") Awareness: Emergent Problem Solving: Requires verbal cues;Requires tactile cues;Decreased initiation;Slow processing General Comments: pt perseverating on going home to see his grandbabies      Exercises General Exercises - Upper Extremity Shoulder Flexion: AAROM;AROM;Both;15 reps;Seated Shoulder ABduction: AROM;AAROM;Both;15 reps;Seated Elbow Flexion: AROM;Strengthening;Both;10 reps;Seated;Theraband Theraband Level (Elbow Flexion): Level 1 (Yellow) Elbow Extension: AROM;Strengthening;Both;10 reps;Seated;Theraband Theraband Level (Elbow Extension): Level 1 (Yellow) General Exercises - Lower Extremity Long Arc Quad: AROM;Both;10 reps (with mod mild resistance and end range 3 sec hold) Heel Slides: AROM;Both;10 reps;Seated (with mod manual resistance) Hip Flexion/Marching: AROM;Both;10 reps;Seated Heel Raises: AROM;10 reps;Seated Other Exercises Other Exercises: lower trap strengthening with level 1 theraband x 10 - pullbacks     General Comments General comments (skin integrity, edema, etc.): VSS      Pertinent Vitals/Pain Pain Assessment: No/denies pain    Home Living                      Prior Function            PT Goals (current goals can now be found in the care plan section) Acute Rehab PT Goals Patient Stated Goal: home to see grandkids Progress towards PT goals: Progressing toward goals    Frequency    Min 3X/week      PT Plan Frequency needs to be updated;Discharge plan needs to be updated    Co-evaluation              AM-PAC PT "6 Clicks" Mobility   Outcome Measure  Help needed turning from your back to your side while in a flat bed without using bedrails?: A Little Help needed moving from lying on your back to sitting on the side of a flat bed without using bedrails?: A Little Help needed moving to and from a bed to a chair (including a wheelchair)?: A Lot Help needed standing up from a chair using your arms (e.g., wheelchair or bedside chair)?: A Lot Help needed to walk in hospital room?: Total Help needed climbing 3-5 steps with a railing? : Total 6 Click Score: 12    End of Session Equipment Utilized During Treatment: Gait belt Activity Tolerance: Patient tolerated treatment well Patient left: in chair;with call bell/phone within reach;with family/visitor present Nurse Communication: Mobility status PT Visit Diagnosis: Muscle weakness (generalized) (M62.81);Difficulty  in walking, not elsewhere classified (R26.2)     Time: 0932-3557 PT Time Calculation (min) (ACUTE ONLY): 30 min  Charges:  $Gait Training: 8-22 mins $Therapeutic Exercise: 8-22 mins                     Kittie Plater, PT, DPT Acute Rehabilitation Services Pager #: [phone] Office #: [phone]    Berline Lopes 02/27/2021, 11:55 AM

## 2021-02-27 NOTE — Progress Notes (Signed)
Inpatient Rehab Admissions Coordinator:   Pt. Progressing with therapies, now decannulated. PT/OT are requesting to re-attempt insurance auth for CIR. I spoke with Pt.'s wife and she is also on board with this. I have sent case to [facility] for review. TOC is aware.  Clemens Catholic, [place], [place] Admissions Coordinator  [phone] ([place]) [phone] (office)

## 2021-02-28 DIAGNOSIS — I2699 Other pulmonary embolism without acute cor pulmonale: Secondary | ICD-10-CM | POA: Diagnosis not present

## 2021-02-28 DIAGNOSIS — S066X9A Traumatic subarachnoid hemorrhage with loss of consciousness of unspecified duration, initial encounter: Secondary | ICD-10-CM | POA: Diagnosis not present

## 2021-02-28 DIAGNOSIS — J9601 Acute respiratory failure with hypoxia: Secondary | ICD-10-CM | POA: Diagnosis not present

## 2021-02-28 DIAGNOSIS — Z20822 Contact with and (suspected) exposure to covid-19: Secondary | ICD-10-CM | POA: Diagnosis not present

## 2021-02-28 LAB — RENAL FUNCTION PANEL
Albumin: 2.4 g/dL — ABNORMAL LOW (ref 3.5–5.0)
Anion gap: 10 (ref 5–15)
BUN: 21 mg/dL (ref 8–23)
CO2: 23 mmol/L (ref 22–32)
Calcium: 9.2 mg/dL (ref 8.9–10.3)
Chloride: 107 mmol/L (ref 98–111)
Creatinine, Ser: 1.06 mg/dL (ref 0.61–1.24)
GFR, Estimated: >60 mL/min (ref >60)
Glucose, Bld: 113 mg/dL — ABNORMAL HIGH (ref 70–99)
Phosphorus: 4.1 mg/dL (ref 2.5–4.6)
Potassium: 4.2 mmol/L (ref 3.5–5.1)
Sodium: 140 mmol/L (ref 135–145)

## 2021-02-28 LAB — CBC
HCT: 27.7 % — ABNORMAL LOW (ref 39.0–52.0)
Hemoglobin: 8.4 g/dL — ABNORMAL LOW (ref 13.0–17.0)
MCH: 27.9 pg (ref 26.0–34.0)
MCHC: 30.3 g/dL (ref 30.0–36.0)
MCV: 92 fL (ref 80.0–100.0)
Platelets: 278 10*3/uL (ref 150–400)
RBC: 3.01 MIL/uL — ABNORMAL LOW (ref 4.22–5.81)
RDW: 18.8 % — ABNORMAL HIGH (ref 11.5–15.5)
WBC: 4.2 10*3/uL (ref 4.0–10.5)
nRBC: 0 % (ref 0.0–0.2)

## 2021-02-28 LAB — GLUCOSE, CAPILLARY
Glucose-Capillary: 120 mg/dL — ABNORMAL HIGH (ref 70–99)
Glucose-Capillary: 168 mg/dL — ABNORMAL HIGH (ref 70–99)
Glucose-Capillary: 271 mg/dL — ABNORMAL HIGH (ref 70–99)
Glucose-Capillary: 207 mg/dL — ABNORMAL HIGH (ref 70–99)

## 2021-02-28 NOTE — Progress Notes (Signed)
[facility] IP rehab admissions - peer to peer was done by Maurie Boettcher, OT and after lengthy discussion, the medical director at [facility] did approve an inpatient rehab stay.  Currently we do not have a bed available, but I will have my partner follow up tomorrow for potential bed availability.  Call for questions.  [phone]

## 2021-02-28 NOTE — Progress Notes (Addendum)
Patient ID: CHESTER SIBERT, male   DOB: 1950-07-06, 70 y.o.   MRN: [id number] 45 Days Post-Op   Subjective: No complaints.  Sleeping mostly.  Wife states he worked well with therapies yesterday  ROS negative except as listed above. Objective: Vital signs in last 24 hours: Temp:  [97.7 F (36.5 C)-99 F (37.2 C)] 97.7 F (36.5 C) (10/13 0757) Pulse Rate:  [56-66] 56 (10/13 0757) Resp:  [12-20] 12 (10/13 0757) BP: (103-139)/(57-92) 103/62 (10/13 0757) SpO2:  [92 %-100 %] 100 % (10/13 0757) Weight:  [115.7 kg] 115.7 kg (10/13 0500) Last BM Date: 02/24/21  Intake/Output from previous day: No intake/output data recorded. Intake/Output this shift: No intake/output data recorded.  Gen:  Alert, NAD HEENT: EOM's intact, pupils equal and round. Decannulated.  Site is clean and covered with gauze Card:  RRR, no M/G/R heard, palpable pedal pulses Pulm:  CTAB, no W/R/R, rate and effort normal Abd: Soft, protuberant, nontender, +BS Ext:  calves soft and nontender Neuro: MAEs, follows commands Skin: no rashes noted, warm and dry  Lab Results: CBC  Recent Labs    02/28/21 0435  WBC 4.2  HGB 8.4*  HCT 27.7*  PLT 278   BMET Recent Labs    02/27/21 0317 02/28/21 0435  NA 138 140  K 3.9 4.2  CL 107 107  CO2 23 23  GLUCOSE 93 113*  BUN 19 21  CREATININE 1.12 1.06  CALCIUM 9.0 9.2   PT/INR No results for input(s): LABPROT, INR in the last 72 hours. ABG No results for input(s): PHART, HCO3 in the last 72 hours.  Invalid input(s): PCO2, PO2  Studies/Results: No results found.  Anti-infectives: Anti-infectives (From admission, onward)    Start     Dose/Rate Route Frequency Ordered Stop   02/04/21 1548  ceFAZolin (ANCEF) IVPB 2g/100 mL premix  Status:  Discontinued        over 30 Minutes  Continuous PRN 02/04/21 1549 02/09/21 1305   02/04/21 1545  ceFAZolin (ANCEF) IVPB 1 g/50 mL premix        1 g 100 mL/hr over 30 Minutes Intravenous  Once 02/04/21 1458 02/04/21 1600    02/04/21 1543  ceFAZolin (ANCEF) 2-4 GM/100ML-% IVPB       Note to Pharmacy: [place]   : cabinet override      02/04/21 1543 02/04/21 1651   01/11/21 2330  ceFEPIme (MAXIPIME) 2 g in sodium chloride 0.9 % 100 mL IVPB  Status:  Discontinued        2 g 200 mL/hr over 30 Minutes Intravenous Every 24 hours 01/11/21 0711 01/16/21 0907   01/10/21 1645  ampicillin (OMNIPEN) 2 g in sodium chloride 0.9 % 100 mL IVPB  Status:  Discontinued        2 g 300 mL/hr over 20 Minutes Intravenous Every 8 hours 01/10/21 1549 01/16/21 0907   01/09/21 2200  ceFEPIme (MAXIPIME) 2 g in sodium chloride 0.9 % 100 mL IVPB  Status:  Discontinued        2 g 200 mL/hr over 30 Minutes Intravenous Every 12 hours 01/09/21 1458 01/11/21 0711   01/08/21 1515  metroNIDAZOLE (FLAGYL) IVPB 500 mg  Status:  Discontinued        500 mg 100 mL/hr over 60 Minutes Intravenous Every 8 hours 01/08/21 1428 01/10/21 1618   01/03/21 0600  vancomycin (VANCOREADY) IVPB 1250 mg/250 mL  Status:  Discontinued        1,250 mg 166.7 mL/hr over 90 Minutes Intravenous  Every 12 hours 01/02/21 1717 01/03/21 0837   01/02/21 1800  vancomycin (VANCOREADY) IVPB 2000 mg/400 mL        2,000 mg 200 mL/hr over 120 Minutes Intravenous  Once 01/02/21 1712 01/02/21 2007   01/02/21 0900  ceFEPIme (MAXIPIME) 2 g in sodium chloride 0.9 % 100 mL IVPB  Status:  Discontinued        2 g 200 mL/hr over 30 Minutes Intravenous Every 8 hours 01/02/21 0849 01/09/21 1458       Assessment/Plan: Fall down stairs 8/12 VDRF - guaifenisen, S/P trach 8/29 by Dr. Bobbye Morton. Has tolerated HTC well, passy muir trials. Trach changed to cuffless #6 on 9/20. There is no XLT #4 cuffless, so downsized to standard #4CL 10/5. Decannulated on 10/11 and doing well TBI/SAH/SDH - NSGY c/s, Dr. Annette Stable. Significant frontal lobe injuries. Keppra x7d for sz ppx (completed) Occipital bone fx - NSGY c/s, Dr. Annette Stable Temporal bone fx extending into middle ear - ENT c/s, Dr. Constance Holster, no acute  treatment, will need re-eval hearing and facial nerve  Right TM Rupture - ENT c/s, Dr. Constance Holster [facility] -  cardiology s/o 9/23 with recs: metoprolol 25mg  BID, amiodarone 200 mg BID x1 week, then decrease to 200 mg daily. Decreased Metoprolol 12.5mg  BID on 10/3 due to hypotension, orthostasis has resolved. ABL anemia- 1u PRBC 9/19, hgb stable. Continue Iron and vitamin c Bilateral pulmonary embolism - Eliquis AKI - Resolved. GFR > 60. Nephrology reports no further need for dialysis. Tunneled catheter out Hx DM2 - SSI. Decreased semglee to 16U BID 9/28. Appreciate DM coordinator assistance  Hx HTN - PRN meds FEN - Reg diet; hypokalemia 10/9 - replace potassium VTE - SCDs, Eliquis ID - off abx, no fevers Dispo - CIR vs SNF.  Medically stable for either when available     LOS: 62 days   [name], PA-C General Surgery and Trauma See amion for on call provider and page number    02/28/2021

## 2021-02-28 NOTE — Progress Notes (Signed)
Physical Therapy Treatment Patient Details Name: Angel [name] MRN: [id number] DOB: [birth year] Today's Date: 02/28/2021   History of Present Illness Angel [name] is a 70 y.o. 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 CT showed R temporal and parietal SAH, SAH anterior frontal lobes  bilaterally. Also sustained right occipital skull fracture, temporal bone fx, right TM rupture, bilateral PE. Intubated 8/12, trach'd 8/29. CRRT 9/1- 9/9.  Pt decannulated 02/26/21.  PMH: DM2, HTN    PT Comments    Patient progressing with mobility and able to ambulate in the room and some in hallway after seated rest.  LE's weak and shaky and limited safety awareness needing help to keep walker close.  Patient continues to be excellent candidate for CIR.  PT to continue to follow.     Recommendations for follow up therapy are one component of a multi-disciplinary discharge planning process, led by the attending physician.  Recommendations may be updated based on patient status, additional functional criteria and insurance authorization.  Follow Up Recommendations  CIR     Equipment Recommendations  Wheelchair cushion (measurements PT);Wheelchair (measurements PT);Hospital bed;3in1 (PT);Other (comment);Rolling walker with 5" wheels    Recommendations for Other Services       Precautions / Restrictions Precautions Precautions: Fall Precaution Comments: watch BP, HR     Mobility  Bed Mobility Overal bed mobility: Needs Assistance Bed Mobility: Rolling;Sidelying to Sit Rolling: Min assist Sidelying to sit: Mod assist       General bed mobility comments: cues for technique, assist to lift trunk pt asking for HHA    Transfers Overall transfer level: Needs assistance Equipment used: Rolling walker (2 wheeled) Transfers: Sit to/from Stand Sit to Stand: Mod assist;+2 safety/equipment         General transfer comment: assist to stand from EOB with +2 A for safety and some lifting help.  stood from recliner mod cues for hand placement  Ambulation/Gait Ambulation/Gait assistance: Mod assist;+2 safety/equipment Gait Distance (Feet): 12 Feet (&10') Assistive device: Rolling walker (2 wheeled) Gait Pattern/deviations: Step-to pattern;Decreased stride length;Shuffle;Wide base of support;Trunk flexed     General Gait Details: assist behind hips for upright posture, walker proximity and balance, chair following behind   Stairs             Wheelchair Mobility    Modified Rankin (Stroke Patients Only)       Balance Overall balance assessment: Needs assistance Sitting-balance support: Feet supported Sitting balance-Leahy Scale: Good Sitting balance - Comments: sitting without UE support on EOB   Standing balance support: Bilateral upper extremity supported Standing balance-Leahy Scale: Poor Standing balance comment: UE support for balance and min to mod A                            Cognition Arousal/Alertness: Awake/alert Behavior During Therapy: WFL for tasks assessed/performed Overall Cognitive Status: Impaired/Different from baseline Area of Impairment: Orientation;Attention;Memory;Safety/judgement;Problem solving               Rancho Levels of Cognitive Functioning Rancho Los Amigos Scales of Cognitive Functioning: Automatic/appropriate   Current Attention Level: Sustained Memory: Decreased short-term memory Following Commands: Follows one step commands with increased time Safety/Judgement: Decreased awareness of safety;Decreased awareness of deficits   Problem Solving: Requires verbal cues;Requires tactile cues;Decreased initiation;Slow processing General Comments: wants to go home and come to outpatient, educated in need to improve independence prior to home.      Exercises  General Comments General comments (skin integrity, edema, etc.): VSS, wife in the room      Pertinent Vitals/Pain Pain Assessment: Faces Faces  Pain Scale: Hurts little more Pain Location: buttocks in sitting Pain Intervention(s): Monitored during session;Repositioned    Home Living                      Prior Function            PT Goals (current goals can now be found in the care plan section) Progress towards PT goals: Progressing toward goals    Frequency    Min 3X/week      PT Plan Current plan remains appropriate    Co-evaluation              AM-PAC PT "6 Clicks" Mobility   Outcome Measure  Help needed turning from your back to your side while in a flat bed without using bedrails?: A Little Help needed moving from lying on your back to sitting on the side of a flat bed without using bedrails?: A Lot Help needed moving to and from a bed to a chair (including a wheelchair)?: A Lot Help needed standing up from a chair using your arms (e.g., wheelchair or bedside chair)?: A Lot Help needed to walk in hospital room?: A Lot Help needed climbing 3-5 steps with a railing? : Total 6 Click Score: 12    End of Session   Activity Tolerance: Patient tolerated treatment well Patient left: in chair;with call bell/phone within reach;with chair alarm set Nurse Communication: Mobility status;Other (comment) (safety as pt more comfortable with feet on floor and sitting forward due to pain on buttocks) PT Visit Diagnosis: Other abnormalities of gait and mobility (R26.89);Other symptoms and signs involving the nervous system (R29.898);History of falling (Z91.81);Muscle weakness (generalized) (M62.81)     Time: 4401-0272 PT Time Calculation (min) (ACUTE ONLY): 28 min  Charges:  $Gait Training: 8-22 mins $Therapeutic Activity: 8-22 mins                     Magda Kiel, PT Acute Rehabilitation Services Pager:[phone] [phone] 02/28/2021    Reginia Naas 02/28/2021, 4:31 PM

## 2021-02-28 NOTE — Progress Notes (Addendum)
IP rehab admissions - Psychologist, occupational has again denied inpatient rehab admission saying that patient cannot tolerate 3 hours of therapy a day and that patient needs a slower paced rehab.  I will discuss this denial with Dr. Naaman Plummer and then will talk with patient/wife.  Call for questions.  [phone]  I did tell patient and his wife about the denial and offer of peer to peer from the medical director.  I have also talked with Dr. Naaman Plummer and with Maurie Boettcher, OT.  We have until 10 am tomorrow if a peer to peer is desired.

## 2021-02-28 NOTE — TOC Progression Note (Signed)
Transition of Care [facility]) - Progression Note    Patient Details  Name: Angel [name] MRN: [id number] Date of Birth: 1950-11-02  [facility] CM/SW Contact  Oren Section Cleta Alberts, RN Phone Number: 02/28/2021, 3:51pm  Clinical Narrative:    Notified by Maurie Boettcher, OT, that after conversation with medical director for [facility], patient has been approved for 28 days of inpatient therapy.  I have notified patient and wife, and they are overjoyed with this news.  CIR admissions coordinator to follow-up with patient/wife to discuss timing of CIR admission.  Appreciate OT's persistence and willingness to advocate for this patient.   Expected Discharge Plan: IP Rehab Facility Barriers to Discharge: Continued Medical Work up  Expected Discharge Plan and Services Expected Discharge Plan: [place]   Discharge Planning Services: CM Consult   Living arrangements for the past 2 months: Single Family Home                                       Social Determinants of Health (SDOH) Interventions    Readmission Risk Interventions No flowsheet data found.  Reinaldo Raddle, RN, BSN  Trauma/Neuro ICU Case Manager [phone]

## 2021-02-28 NOTE — Progress Notes (Signed)
Speech Language Pathology Treatment: Cognitive-Linquistic  Patient Details Name: KENNA KIRN MRN: [id number] DOB: 1951/02/22 Today's Date: 02/28/2021 Time: 0045-9977 SLP Time Calculation (min) (ACUTE ONLY): 25 min  Assessment / Plan / Recommendation Clinical Impression  Pt seen for cognitive/linguistic treatment focusing on prospective memory tasks/STM functional tasks related to home activities with min cues provided for skilled interventions/memory strategies such as rehearsal, repetition and association utilized with 80% accuracy achieved.  3 word recall with 3 minute time constraint with min cues provided for category and 100% accuracy achieved.  Verbal sequencing for familiar tasks (ie: constructing a walking stick) with redirection and language expansion utilized with 82% accuracy achieved.  Naming task for familiar task with difficulty noted and perseveration with familiar category.  Pt had difficulty recalling medications/frequency that were previously familiar before TBI per wife's report.  Pt was able to recall portion of medications/frequency with cues provided by SLP/wife with association of condition/medication with 70% accuracy overall obtained.  Continued ST recommended once transitioned to next level of care.  HPI HPI: EWING FANDINO is a 70 y.o. 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 CT showed R temporal and parietal SAH, SAH anterior frontal lobes  bilaterally. Also sustained right occipital skull fracture, temporal bone fx, right TM rupture, bilateral PE. Intubated 8/12, trach'd 8/29, decannulated 10/11. PMH: DM2, HTN      SLP Plan  Continue with current plan of care      Recommendations for follow up therapy are one component of a multi-disciplinary discharge planning process, led by the attending physician.  Recommendations may be updated based on patient status, additional functional criteria and insurance authorization.    Recommendations   CIR or SNF pending  insurance                General recommendations: Rehab consult Follow up Recommendations: Inpatient Rehab SLP Visit Diagnosis: Cognitive communication deficit (S14.239) Plan: Continue with current plan of care                      Elvina Sidle, M.S., [place]  02/28/2021, 3:45 PM

## 2021-03-01 ENCOUNTER — Encounter (HOSPITAL_COMMUNITY): Payer: Self-pay | Admitting: Physical Medicine & Rehabilitation

## 2021-03-01 ENCOUNTER — Other Ambulatory Visit: Payer: Self-pay

## 2021-03-01 ENCOUNTER — Inpatient Hospital Stay (HOSPITAL_COMMUNITY)
Admission: RE | Admit: 2021-03-01 | Discharge: 2021-03-15 | DRG: 945 | Disposition: A | Discharge status: Home / Self Care | Payer: PPO | Source: Intra-hospital | Attending: Physical Medicine & Rehabilitation | Admitting: Physical Medicine & Rehabilitation

## 2021-03-01 DIAGNOSIS — Z9981 Dependence on supplemental oxygen: Secondary | ICD-10-CM | POA: Diagnosis not present

## 2021-03-01 DIAGNOSIS — I609 Nontraumatic subarachnoid hemorrhage, unspecified: Secondary | ICD-10-CM | POA: Diagnosis not present

## 2021-03-01 DIAGNOSIS — Z79899 Other long term (current) drug therapy: Secondary | ICD-10-CM | POA: Diagnosis not present

## 2021-03-01 DIAGNOSIS — E785 Hyperlipidemia, unspecified: Secondary | ICD-10-CM | POA: Diagnosis present

## 2021-03-01 DIAGNOSIS — J810 Acute pulmonary edema: Secondary | ICD-10-CM

## 2021-03-01 DIAGNOSIS — S0219XD Other fracture of base of skull, subsequent encounter for fracture with routine healing: Secondary | ICD-10-CM | POA: Diagnosis not present

## 2021-03-01 DIAGNOSIS — W109XXD Fall (on) (from) unspecified stairs and steps, subsequent encounter: Secondary | ICD-10-CM | POA: Diagnosis present

## 2021-03-01 DIAGNOSIS — K5901 Slow transit constipation: Secondary | ICD-10-CM | POA: Diagnosis present

## 2021-03-01 DIAGNOSIS — Z7982 Long term (current) use of aspirin: Secondary | ICD-10-CM | POA: Diagnosis not present

## 2021-03-01 DIAGNOSIS — H7291 Unspecified perforation of tympanic membrane, right ear: Secondary | ICD-10-CM | POA: Diagnosis present

## 2021-03-01 DIAGNOSIS — N179 Acute kidney failure, unspecified: Secondary | ICD-10-CM

## 2021-03-01 DIAGNOSIS — I2699 Other pulmonary embolism without acute cor pulmonale: Secondary | ICD-10-CM

## 2021-03-01 DIAGNOSIS — E1165 Type 2 diabetes mellitus with hyperglycemia: Secondary | ICD-10-CM | POA: Diagnosis present

## 2021-03-01 DIAGNOSIS — E669 Obesity, unspecified: Secondary | ICD-10-CM | POA: Diagnosis present

## 2021-03-01 DIAGNOSIS — I10 Essential (primary) hypertension: Secondary | ICD-10-CM | POA: Diagnosis present

## 2021-03-01 DIAGNOSIS — I959 Hypotension, unspecified: Secondary | ICD-10-CM | POA: Diagnosis not present

## 2021-03-01 DIAGNOSIS — E041 Nontoxic single thyroid nodule: Secondary | ICD-10-CM | POA: Diagnosis present

## 2021-03-01 DIAGNOSIS — D62 Acute posthemorrhagic anemia: Secondary | ICD-10-CM

## 2021-03-01 DIAGNOSIS — E162 Hypoglycemia, unspecified: Secondary | ICD-10-CM | POA: Diagnosis not present

## 2021-03-01 DIAGNOSIS — S069X0S Unspecified intracranial injury without loss of consciousness, sequela: Secondary | ICD-10-CM | POA: Diagnosis not present

## 2021-03-01 DIAGNOSIS — S069XAA Unspecified intracranial injury with loss of consciousness status unknown, initial encounter: Secondary | ICD-10-CM | POA: Diagnosis present

## 2021-03-01 DIAGNOSIS — Z794 Long term (current) use of insulin: Secondary | ICD-10-CM | POA: Diagnosis not present

## 2021-03-01 DIAGNOSIS — R7309 Other abnormal glucose: Secondary | ICD-10-CM

## 2021-03-01 DIAGNOSIS — Z6838 Body mass index (BMI) 38.0-38.9, adult: Secondary | ICD-10-CM

## 2021-03-01 DIAGNOSIS — I4891 Unspecified atrial fibrillation: Secondary | ICD-10-CM

## 2021-03-01 DIAGNOSIS — S069X0D Unspecified intracranial injury without loss of consciousness, subsequent encounter: Secondary | ICD-10-CM | POA: Diagnosis not present

## 2021-03-01 DIAGNOSIS — Z7984 Long term (current) use of oral hypoglycemic drugs: Secondary | ICD-10-CM | POA: Diagnosis not present

## 2021-03-01 DIAGNOSIS — E11649 Type 2 diabetes mellitus with hypoglycemia without coma: Secondary | ICD-10-CM | POA: Diagnosis not present

## 2021-03-01 DIAGNOSIS — R001 Bradycardia, unspecified: Secondary | ICD-10-CM | POA: Diagnosis not present

## 2021-03-01 DIAGNOSIS — Z86711 Personal history of pulmonary embolism: Secondary | ICD-10-CM | POA: Diagnosis not present

## 2021-03-01 DIAGNOSIS — S066XAD Traumatic subarachnoid hemorrhage with loss of consciousness status unknown, subsequent encounter: Principal | ICD-10-CM

## 2021-03-01 DIAGNOSIS — S069X0A Unspecified intracranial injury without loss of consciousness, initial encounter: Secondary | ICD-10-CM

## 2021-03-01 DIAGNOSIS — J811 Chronic pulmonary edema: Secondary | ICD-10-CM

## 2021-03-01 LAB — GLUCOSE, CAPILLARY
Glucose-Capillary: 157 mg/dL — ABNORMAL HIGH (ref 70–99)
Glucose-Capillary: 171 mg/dL — ABNORMAL HIGH (ref 70–99)
Glucose-Capillary: 216 mg/dL — ABNORMAL HIGH (ref 70–99)
Glucose-Capillary: 183 mg/dL — ABNORMAL HIGH (ref 70–99)

## 2021-03-01 MED ORDER — GABAPENTIN 300 MG PO CAPS
300.0000 mg | ORAL_CAPSULE | Freq: Two times a day (BID) | ORAL | Status: DC
  Administered 2021-03-01 – 2021-03-15 (×28): 300 mg via ORAL
  Filled 2021-03-01 (×28): qty 1

## 2021-03-01 MED ORDER — ARTIFICIAL TEARS OPHTHALMIC OINT
TOPICAL_OINTMENT | OPHTHALMIC | Status: DC | PRN
  Filled 2021-03-01: qty 3.5

## 2021-03-01 MED ORDER — APIXABAN 5 MG PO TABS
5.0000 mg | ORAL_TABLET | Freq: Two times a day (BID) | ORAL | Status: DC
  Administered 2021-03-01 – 2021-03-15 (×28): 5 mg via ORAL
  Filled 2021-03-01 (×27): qty 1

## 2021-03-01 MED ORDER — AMIODARONE HCL 200 MG PO TABS
200.0000 mg | ORAL_TABLET | Freq: Every day | ORAL | Status: DC
  Administered 2021-03-02 – 2021-03-15 (×14): 200 mg via ORAL
  Filled 2021-03-01 (×14): qty 1

## 2021-03-01 MED ORDER — SORBITOL 70 % SOLN
60.0000 mL | Freq: Once | Status: DC
  Filled 2021-03-01 (×2): qty 60

## 2021-03-01 MED ORDER — PANTOPRAZOLE SODIUM 40 MG PO TBEC
40.0000 mg | DELAYED_RELEASE_TABLET | Freq: Every day | ORAL | Status: DC
  Administered 2021-03-02 – 2021-03-15 (×14): 40 mg via ORAL
  Filled 2021-03-01 (×14): qty 1

## 2021-03-01 MED ORDER — ACETAMINOPHEN 325 MG PO TABS
650.0000 mg | ORAL_TABLET | Freq: Four times a day (QID) | ORAL | Status: DC | PRN
  Administered 2021-03-02: 650 mg via ORAL
  Filled 2021-03-01: qty 2

## 2021-03-01 MED ORDER — DOCUSATE SODIUM 100 MG PO CAPS
100.0000 mg | ORAL_CAPSULE | Freq: Two times a day (BID) | ORAL | Status: DC
  Administered 2021-03-01 – 2021-03-15 (×24): 100 mg via ORAL
  Filled 2021-03-01 (×26): qty 1

## 2021-03-01 MED ORDER — GUAIFENESIN 100 MG/5ML PO SOLN
15.0000 mL | ORAL | Status: DC
  Administered 2021-03-01 – 2021-03-03 (×9): 300 mg via ORAL
  Filled 2021-03-01 (×4): qty 15
  Filled 2021-03-01: qty 75
  Filled 2021-03-01: qty 15
  Filled 2021-03-01: qty 5
  Filled 2021-03-01: qty 75
  Filled 2021-03-01 (×2): qty 25

## 2021-03-01 MED ORDER — QUETIAPINE FUMARATE 25 MG PO TABS
25.0000 mg | ORAL_TABLET | Freq: Every day | ORAL | Status: DC
  Administered 2021-03-01 – 2021-03-14 (×14): 25 mg via ORAL
  Filled 2021-03-01 (×15): qty 1

## 2021-03-01 MED ORDER — INSULIN GLARGINE-YFGN 100 UNIT/ML ~~LOC~~ SOLN
16.0000 [IU] | Freq: Two times a day (BID) | SUBCUTANEOUS | Status: DC
  Administered 2021-03-01 – 2021-03-09 (×16): 16 [IU] via SUBCUTANEOUS
  Filled 2021-03-01 (×19): qty 0.16

## 2021-03-01 MED ORDER — OXYCODONE HCL 5 MG PO TABS
10.0000 mg | ORAL_TABLET | ORAL | Status: DC | PRN
  Administered 2021-03-06 – 2021-03-10 (×5): 15 mg via ORAL
  Administered 2021-03-11: 10 mg via ORAL
  Filled 2021-03-01 (×4): qty 3
  Filled 2021-03-01: qty 2
  Filled 2021-03-01: qty 3

## 2021-03-01 MED ORDER — ENSURE ENLIVE PO LIQD
237.0000 mL | Freq: Two times a day (BID) | ORAL | Status: DC
  Administered 2021-03-02 – 2021-03-14 (×24): 237 mL via ORAL

## 2021-03-01 MED ORDER — SENNA 8.6 MG PO TABS
1.0000 | ORAL_TABLET | Freq: Every day | ORAL | Status: DC
  Administered 2021-03-02 – 2021-03-15 (×14): 8.6 mg via ORAL
  Filled 2021-03-01 (×14): qty 1

## 2021-03-01 MED ORDER — FERROUS SULFATE 325 (65 FE) MG PO TABS
325.0000 mg | ORAL_TABLET | Freq: Two times a day (BID) | ORAL | Status: DC
  Administered 2021-03-01 – 2021-03-15 (×28): 325 mg via ORAL
  Filled 2021-03-01 (×28): qty 1

## 2021-03-01 MED ORDER — POLYETHYLENE GLYCOL 3350 17 G PO PACK
17.0000 g | PACK | Freq: Every day | ORAL | Status: DC
  Filled 2021-03-01: qty 1

## 2021-03-01 MED ORDER — METHOCARBAMOL 500 MG PO TABS
1000.0000 mg | ORAL_TABLET | Freq: Three times a day (TID) | ORAL | Status: DC
  Administered 2021-03-01 – 2021-03-15 (×42): 1000 mg via ORAL
  Filled 2021-03-01 (×41): qty 2

## 2021-03-01 MED ORDER — IPRATROPIUM-ALBUTEROL 0.5-2.5 (3) MG/3ML IN SOLN
3.0000 mL | Freq: Four times a day (QID) | RESPIRATORY_TRACT | Status: DC | PRN
  Filled 2021-03-01: qty 3

## 2021-03-01 MED ORDER — ASCORBIC ACID 500 MG PO TABS
500.0000 mg | ORAL_TABLET | Freq: Two times a day (BID) | ORAL | Status: DC
  Administered 2021-03-01 – 2021-03-15 (×28): 500 mg via ORAL
  Filled 2021-03-01 (×28): qty 1

## 2021-03-01 MED ORDER — ONDANSETRON 4 MG PO TBDP: 4.0000 mg | ORAL_TABLET | Freq: Four times a day (QID) | ORAL | Status: DC | PRN

## 2021-03-01 MED ORDER — ALPRAZOLAM 0.25 MG PO TABS: 0.2500 mg | ORAL_TABLET | Freq: Two times a day (BID) | ORAL | Status: DC | PRN

## 2021-03-01 MED ORDER — INSULIN ASPART 100 UNIT/ML IJ SOLN
0.0000 [IU] | Freq: Three times a day (TID) | INTRAMUSCULAR | Status: DC
  Administered 2021-03-01: 5 [IU] via SUBCUTANEOUS
  Administered 2021-03-01: 3 [IU] via SUBCUTANEOUS
  Administered 2021-03-02: 5 [IU] via SUBCUTANEOUS
  Administered 2021-03-02: 3 [IU] via SUBCUTANEOUS
  Administered 2021-03-02: 5 [IU] via SUBCUTANEOUS
  Administered 2021-03-03 (×2): 3 [IU] via SUBCUTANEOUS
  Administered 2021-03-03 – 2021-03-04 (×3): 5 [IU] via SUBCUTANEOUS
  Administered 2021-03-04: 3 [IU] via SUBCUTANEOUS
  Administered 2021-03-05: 8 [IU] via SUBCUTANEOUS
  Administered 2021-03-05: 3 [IU] via SUBCUTANEOUS
  Administered 2021-03-05: 2 [IU] via SUBCUTANEOUS
  Administered 2021-03-06: 8 [IU] via SUBCUTANEOUS
  Administered 2021-03-06: 3 [IU] via SUBCUTANEOUS
  Administered 2021-03-06 – 2021-03-07 (×3): 5 [IU] via SUBCUTANEOUS
  Administered 2021-03-08: 3 [IU] via SUBCUTANEOUS
  Administered 2021-03-08 (×2): 5 [IU] via SUBCUTANEOUS
  Administered 2021-03-09 (×2): 3 [IU] via SUBCUTANEOUS
  Administered 2021-03-09: 5 [IU] via SUBCUTANEOUS
  Administered 2021-03-10: 3 [IU] via SUBCUTANEOUS
  Administered 2021-03-10 – 2021-03-11 (×3): 5 [IU] via SUBCUTANEOUS
  Administered 2021-03-11: 3 [IU] via SUBCUTANEOUS
  Administered 2021-03-12: 2 [IU] via SUBCUTANEOUS
  Administered 2021-03-12 (×2): 3 [IU] via SUBCUTANEOUS
  Administered 2021-03-13: 2 [IU] via SUBCUTANEOUS

## 2021-03-01 MED ORDER — IPRATROPIUM-ALBUTEROL 0.5-2.5 (3) MG/3ML IN SOLN
3.0000 mL | Freq: Once | RESPIRATORY_TRACT | Status: AC
  Administered 2021-03-01: 3 mL via RESPIRATORY_TRACT
  Filled 2021-03-01: qty 3

## 2021-03-01 MED ORDER — METOPROLOL TARTRATE 12.5 MG HALF TABLET
12.5000 mg | ORAL_TABLET | Freq: Two times a day (BID) | ORAL | Status: DC
  Administered 2021-03-01 – 2021-03-15 (×27): 12.5 mg via ORAL
  Filled 2021-03-01 (×28): qty 1

## 2021-03-01 MED ORDER — ONDANSETRON HCL 4 MG/2ML IJ SOLN: 4.0000 mg | Freq: Four times a day (QID) | INTRAMUSCULAR | Status: DC | PRN

## 2021-03-01 MED ORDER — BISACODYL 10 MG RE SUPP
10.0000 mg | Freq: Every day | RECTAL | Status: DC | PRN
  Filled 2021-03-01: qty 1

## 2021-03-01 NOTE — Progress Notes (Signed)
Occupational Therapy Treatment Patient Details Name: Angel [name] MRN: [id number] DOB: 03/12/1951 Today's Date: 03/01/2021   History of present illness Angel [name] is a 70 y.o. 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 CT showed R temporal and parietal SAH, SAH anterior frontal lobes  bilaterally. Also sustained right occipital skull fracture, temporal bone fx, right TM rupture, bilateral PE. Intubated 8/12, trach'd 8/29. CRRT 9/1- 9/9.  Pt decannulated 02/26/21.  PMH: DM2, HTN   OT comments  Pt continues to demonstrate improvement daily. Able to ambulate @ 10 steps into bathroom with +2 Mod A for safety  @ RW level to attempt to have a BM. Pt recognizes this therapist and is joking appropriately. Son/wife present for session. Son assisting with chair follow during session. Pt/family excited about DC to CIR. Pt states "this is the bridge I need to cross in order to get home". Pt very appreciative. VSS on RA.    Recommendations for follow up therapy are one component of a multi-disciplinary discharge planning process, led by the attending physician.  Recommendations may be updated based on patient status, additional functional criteria and insurance authorization.    Follow Up Recommendations  CIR    Equipment Recommendations  3 in 1 bedside commode    Recommendations for Other Services Rehab consult    Precautions / Restrictions Precautions Precautions: Fall       Mobility Bed Mobility Overal bed mobility: Needs Assistance       Supine to sit: Min assist          Transfers Overall transfer level: Needs assistance Equipment used: Rolling walker (2 wheeled) Transfers: Sit to/from Stand Sit to Stand: Mod assist;+2 safety/equipment Stand pivot transfers: Mod assist;+2 physical assistance            Balance Overall balance assessment: Needs assistance   Sitting balance-Leahy Scale: Good       Standing balance-Leahy Scale: Poor                              ADL either performed or assessed with clinical judgement   ADL Overall ADL's : Needs assistance/impaired                         Toilet Transfer: +2 for physical assistance;Ambulation;Moderate assistance   Toileting- Clothing Manipulation and Hygiene: Moderate assistance       Functional mobility during ADLs: Moderate assistance;+2 for physical assistance;Rolling walker General ADL Comments: Able to walk into the bathroom this session. Son pushed recliner behind if needed     Vision       Perception     Praxis      Cognition Arousal/Alertness: Awake/alert Behavior During Therapy: WFL for tasks assessed/performed Overall Cognitive Status: Impaired/Different from baseline                     Current Attention Level: Selective Memory: Decreased short-term memory Following Commands: Follows one step commands consistently Safety/Judgement: Decreased awareness of safety Awareness: Emergent Problem Solving: Slow processing General Comments: improving daily; increased awareness of need for safety        Exercises Exercises: Other exercises Other Exercises Other Exercises: encouraged BUE exericse adn B hand grip/pinc strengthening   Shoulder Instructions       General Comments      Pertinent Vitals/ Pain       Pain Assessment: Faces Faces Pain Scale: Hurts a  little bit Pain Location: buttocks in sitting Pain Descriptors / Indicators: Discomfort Pain Intervention(s): Limited activity within patient's tolerance  Home Living                                          Prior Functioning/Environment              Frequency  Min 2X/week        Progress Toward Goals  OT Goals(current goals can now be found in the care plan section)  Progress towards OT goals: Progressing toward goals  Acute Rehab OT Goals Patient Stated Goal: home to see grandkids OT Goal Formulation: With patient/family Time For Goal  Achievement: 03/15/21 Potential to Achieve Goals: Good ADL Goals Pt Will Perform Eating: with set-up;with supervision;with adaptive utensils;sitting Pt Will Perform Grooming: with set-up;with supervision;sitting;with adaptive equipment Pt Will Perform Upper Body Bathing: with modified independence;sitting Pt Will Perform Lower Body Bathing: with supervision;sit to/from stand;with set-up Pt Will Perform Upper Body Dressing: with modified independence;sitting Pt Will Perform Lower Body Dressing: with min assist;sitting/lateral leans;sit to/from stand Pt Will Transfer to Toilet: with mod assist;with +2 assist;bedside commode Pt Will Perform Toileting - Clothing Manipulation and hygiene: with mod assist;sitting/lateral leans;sit to/from stand Additional ADL Goal #1: Pt will demonstrate anticipatory awareness during ADL tasks in moderately distracting environment Additional ADL Goal #2: pt will visually locate 2 adl items 50% of request Additional ADL Goal #3: pt will static sit eob mod (A) for 10 minutes with stable VSS  Plan Discharge plan remains appropriate    Co-evaluation                 AM-PAC OT "6 Clicks" Daily Activity     Outcome Measure   Help from another person eating meals?: A Little Help from another person taking care of personal grooming?: A Little Help from another person toileting, which includes using toliet, bedpan, or urinal?: A Lot Help from another person bathing (including washing, rinsing, drying)?: A Lot Help from another person to put on and taking off regular upper body clothing?: A Little Help from another person to put on and taking off regular lower body clothing?: A Lot 6 Click Score: 15    End of Session Equipment Utilized During Treatment: Gait belt;Rolling walker  OT Visit Diagnosis: Unsteadiness on feet (R26.81);Other abnormalities of gait and mobility (R26.89);Muscle weakness (generalized) (M62.81);Other symptoms and signs involving cognitive  function;Pain Pain - Right/Left: Right Pain - part of body: Hand   Activity Tolerance Patient tolerated treatment well   Patient Left in chair;with call bell/phone within reach;with chair alarm set;with family/visitor present   Nurse Communication Mobility status        Time: 8366-2947 OT Time Calculation (min): 24 min  Charges: OT General Charges $OT Visit: 1 Visit OT Treatments $Self Care/Home Management : 23-37 mins  Maurie Boettcher, OT/L   Acute OT Clinical Specialist [place] Pager [phone] Office [phone]   [facility] 03/01/2021, 3:49 PM

## 2021-03-01 NOTE — Progress Notes (Signed)
PMR Admission Coordinator Pre-Admission Assessment   Patient: Angel [name] is an 70 y.o., male MRN: [id number] DOB: March 28, 1951 Height: $RemoveBefo'6\' 2"'FdpPoYjbUBp$  (188 cm) Weight: 110.4 kg   Insurance Information HMO: yes     PPO:      PCP:      IPA:      80/20:      OTHER:  PRIMARY: [employer]      Policy#: [id number]      Subscriber: patient CM Name: Lynelle Smoke       Phone#: [phone]     Fax#:  I received a call from [place] at [employer] granting approval on 03/01/21 for 7 days. Do not need to send clinical updates as they have Epic access.  Pre-Cert#: [id number]      Employer: n/a Benefits:  Phone #: [phone]     Name:  Irene Shipper Date: 08/18/2015 - still active Deductible: no deductible ($0) OOP Max: $3,450 ($ 284.42 met) CIR: $325/day co-pay for days 1-6, $0/day days 7-90 SNF:  $0/day co-pay for days 1-20, $184/day co-pay for days 21-100; limited to 100 days/benefit period Outpatient: $30/visit co-pay; limited by medical necessity Home Health:  100% coverage DME: 80% coverage; 20% co-insurance Providers: in network SECONDARY:  none     Policy#:      Phone#:    Development worker, community:       Phone#:    The Therapist, art Information Summary" for patients in Inpatient Rehabilitation Facilities with attached "Privacy Act [place] Records" was provided and verbally reviewed with: Patient and Family   Emergency Contact Information Contact Information       Name Relation Home Work Mobile    Angel [name]   [phone]   [phone]    [name],Angel Daughter     [phone]    Angel [name]     [phone]           Current Medical History  Patient Admitting Diagnosis: Fall with TBI   History of Present Illness:  Angel [name]. [name] is a 71 year old right-handed male with history of diabetes mellitus, hyperlipidemia and hypertension.  Per chart review patient lives with spouse.  1 level home with level entry.  Independent prior to admission.  Presented 12/28/2020 after a fall down approximately 8  steps while going down to his basement with positive loss of consciousness.  Patient reportedly agitated in route confused with emesis x2.  He did have blood coming from his right ear.  He required intubation for airway protection.  Cranial CT scan of the head as well as maxillofacial showed subarachnoid hemorrhage over the posterior right temporal lobe and parietal lobe.  Additional extra-axial hemorrhage over the right convexity.  Areas of subarachnoid hemorrhage in the anterior frontal lobes bilaterally with blood along the falx likely subdural.  Minimally displaced right occipital skull fracture.  A longitudinal right temporal bone fracture extends through the right middle ear cavity.  CT cervical spine negative.  CT of chest abdomen pelvis showed no evidence of acute trauma.  There was an incidental finding of 2.9 cm heterogeneous right thyroid nodule present recommend thyroid ultrasound as outpatient.  Admission chemistries unremarkable except potassium 3.3, glucose 166, alcohol negative, hemoglobin 10.8, lactic acid 4.0.  Neurosurgery follow-up for [facility] Dr. Annette Stable recommended conservative care.  Maintained on Keppra x7 days for seizure prophylaxis.  ENT Dr. Constance Holster in regards to temporal bone fractures again advised conservative care.  Hospital course cardiology services consulted 01/07/2021 for evaluation of atrial fibrillation with RVR started on amiodarone with bolus load.  Echocardiogram showed ejection fraction of 60 to 65% no wall motion abnormalities.  CT angiogram of the chest showed bilateral pulmonary emboli most significantly affecting the right lower lobe pulmonary artery with associated reduced perfusion of the posterior medial basilar segments of the right lower lobe.  Vascular ultrasound left upper extremity findings consistent with age-indeterminate superficial vein thrombosis involving the left cephalic vein and left basilic vein 4/92/0100.  Patient underwent placement of tracheostomy 01/14/2021 per  Dr.Lovick.  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  His tracheostomy tube was downsized to a #6 cuffless 02/05/2021 and to a #4 cuffless 02/20/2021 and decannulated 02/26/2021.  Hospital course complicated by AKI with nephrology services consulted 01/13/2021 with creatinine 4.5-4.96 felt most likely secondary to hypotensive episode suspect hospital course sepsis and IV contrast.  He did require short course of hemodialysis and tunneled catheter had been placed with latest hemodialysis 02/08/2021 and AKI resolved with latest creatinine 1.06 and tunneled catheter has since been removed.  Initially with nasogastric tube feeds for nutritional support and his diet has been advanced to a regular consistency.  Hospital course acute blood loss anemia transfuse 1 unit packed red blood cells 02/04/2021 with latest hemoglobin 8.4.  Palliative care has been consulted to establish goals of care.  Therapy evaluations completed due to patient's TBI/SAH was admitted for a comprehensive rehab program.     Patient's medical record from [facility] has been reviewed by the rehabilitation admission coordinator and physician.   Past Medical History      Past Medical History:  Diagnosis Date   DM (diabetes mellitus) ([place])     HLD (hyperlipidemia)     Hypertension        Has the patient had major surgery during 100 days prior to admission? Yes   Family History   family history is not on file.   Current Medications   Current Facility-Administered Medications:    acetaminophen (TYLENOL) tablet 1,000 mg, 1,000 mg, Oral, Q6H, Georganna Skeans, MD, 1,000 mg at 02/16/21 1204   [EXPIRED] amiodarone (PACERONE) tablet 200 mg, 200 mg, Oral, BID, 200 mg at 02/14/21 0924 **FOLLOWED BY** amiodarone (PACERONE)  tablet 200 mg, 200 mg, Oral, Daily, Donato Heinz, MD, 200 mg at 02/16/21 7121   apixaban (ELIQUIS) tablet 5 mg, 5 mg, Oral, BID, Georganna Skeans, MD, 5 mg at 02/16/21 0843   artificial tears (LACRILUBE) ophthalmic ointment, , Both Eyes, PRN, Jesusita Oka, MD, 1 application at 97/58/83 1724   ascorbic acid (VITAMIN C) tablet 500 mg, 500 mg, Oral, BID, Meuth, Brooke A, PA-C, 500 mg at 02/16/21 0843   chlorhexidine (PERIDEX) 0.12 % solution 15 mL, 15 mL, Mouth Rinse, BID, Georganna Skeans, MD, 15 mL at 02/15/21 2155   docusate sodium (COLACE) capsule 100 mg, 100 mg, Oral, BID, Georganna Skeans, MD, 100 mg at 02/16/21 0843   feeding supplement (NEPRO CARB STEADY) liquid 237 mL, 237 mL, Oral, TID BM, Meuth, Brooke A, PA-C, 237 mL at 02/15/21 2030   ferrous sulfate tablet 325 mg, 325 mg, Oral, BID WC, Meuth, Brooke A, PA-C, 325 mg at 02/16/21 0842   guaiFENesin (ROBITUSSIN) 100 MG/5ML solution 300 mg, 15 mL, Oral, Q4H, Georganna Skeans, MD, 300 mg at 02/16/21 1335   heparin injection 1,000-6,000 Units, 1,000-6,000 Units, CRRT, PRN, Donato Heinz, MD,  2,000 Units at 01/29/21 1807   hydrALAZINE (APRESOLINE) injection 10 mg, 10 mg, Intravenous, Q4H PRN, Jesusita Oka, MD, 10 mg at 01/24/21 1634   HYDROmorphone (DILAUDID) injection 0.5-1 mg, 0.5-1 mg, Intravenous, Q2H PRN, Georganna Skeans, MD, 1 mg at 01/30/21 0521   insulin aspart (novoLOG) injection 0-15 Units, 0-15 Units, Subcutaneous, TID WC & HS, Maczis, Barth Kirks, PA-C, 2 Units at 02/16/21 1203   insulin glargine-yfgn (SEMGLEE) injection 16 Units, 16 Units, Subcutaneous, BID, Meuth, Brooke A, PA-C, 16 Units at 02/16/21 0839   ipratropium-albuterol (DUONEB) 0.5-2.5 (3) MG/3ML nebulizer solution 3 mL, 3 mL, Nebulization, Q6H PRN, Meuth, Brooke A, PA-C, 3 mL at 02/16/21 1203   MEDLINE mouth rinse, 15 mL, Mouth Rinse, q12n4p, Georganna Skeans, MD, 15 mL at 02/15/21 1600   methocarbamol (ROBAXIN) tablet 1,000 mg, 1,000 mg, Oral, Q8H,  Georganna Skeans, MD, 1,000 mg at 02/16/21 1335   metoprolol tartrate (LOPRESSOR) tablet 25 mg, 25 mg, Oral, BID, Georganna Skeans, MD, 25 mg at 02/16/21 0843   midazolam (VERSED) injection 1 mg, 1 mg, Intravenous, Q2H PRN, Rolm Bookbinder, MD, 1 mg at 01/29/21 1417   ondansetron (ZOFRAN-ODT) disintegrating tablet 4 mg, 4 mg, Oral, Q6H PRN **OR** ondansetron (ZOFRAN) injection 4 mg, 4 mg, Intravenous, Q6H PRN, Jesusita Oka, MD, 4 mg at 01/27/21 9030   oxyCODONE (Oxy IR/ROXICODONE) immediate release tablet 10-15 mg, 10-15 mg, Oral, Q4H PRN, Georganna Skeans, MD, 10 mg at 02/15/21 1717   pantoprazole (PROTONIX) EC tablet 40 mg, 40 mg, Oral, Daily, Georganna Skeans, MD, 40 mg at 02/16/21 0843   polyethylene glycol (MIRALAX / GLYCOLAX) packet 17 g, 17 g, Oral, Daily, Lovick, Montel Culver, MD, 17 g at 02/16/21 0841   QUEtiapine (SEROQUEL) tablet 25 mg, 25 mg, Oral, QHS, Georganna Skeans, MD, 25 mg at 02/15/21 2155   senna (SENOKOT) tablet 8.6 mg, 1 tablet, Oral, Daily, Jesusita Oka, MD, 8.6 mg at 02/16/21 0923   Facility-Administered Medications Ordered in Other Encounters:    lidocaine (cardiac) 100 mg/2m (XYLOCAINE) injection 2%, , Intravenous, Anesthesia Intra-op, FViann FishB, CRNA, 100 mg at 01/11/21 1439   succinylcholine (ANECTINE) syringe, , Intravenous, Anesthesia Intra-op, FViann FishB, CRNA, 140 mg at 01/11/21 1439   Patients Current Diet:  Diet Order                  Diet Carb Modified Fluid consistency: Thin; Room service appropriate? Yes  Diet effective now                         Precautions / Restrictions Precautions Precautions: Fall Precaution Comments: trach collar, cortrak, watch BP HR Restrictions Weight Bearing Restrictions: No    Has the patient had 2 or more falls or a fall with injury in the past year? No   Prior Activity Level Community (5-7x/wk): Went out daily.  Was independent and driving.   Prior Functional Level Self Care: Did the patient  need help bathing, dressing, using the toilet or eating? Independent   Indoor Mobility: Did the patient need assistance with walking from room to room (with or without device)? Independent   Stairs: Did the patient need assistance with internal or external stairs (with or without device)? Independent   Functional Cognition: Did the patient need help planning regular tasks such as shopping or remembering to take medications? Independent   Patient Information Are you of Hispanic, Latino/a,or Spanish origin?: A. No, not of Hispanic, Latino/a, or Spanish origin What  is your race?: A. White Do you need or want an interpreter to communicate with a doctor or health care staff?: 0. No   Patient's Response To:  Health Literacy and Transportation Is the patient able to respond to health literacy and transportation needs?: Yes Health Literacy - How often do you need to have someone help you when you read instructions, pamphlets, or other written material from your doctor or pharmacy?: Never In the past 12 months, has lack of transportation kept you from medical appointments or from getting medications?: No In the past 12 months, has lack of transportation kept you from meetings, work, or from getting things needed for daily living?: No   Development worker, international aid / [place] Devices/Equipment: None Home Equipment: None   Prior Device Use: Indicate devices/aids used by the patient prior to current illness, exacerbation or injury? None of the above   Current Functional Level Cognition   Arousal/Alertness: Lethargic Overall Cognitive Status: Impaired/Different from baseline Difficult to assess due to: Tracheostomy Current Attention Level: Sustained Orientation Level: Oriented X4 Following Commands: Follows one step commands consistently Safety/Judgement: Decreased awareness of safety, Decreased awareness of deficits General Comments: Improved level of arousal; engaging during  session; increased awareness of limitations and able to report how he feels; awareness of decreased memory Attention: Focused Focused Attention: Impaired Focused Attention Impairment: Verbal basic Awareness: Impaired Awareness Impairment: Emergent impairment Problem Solving:  (will continue to assess) Safety/Judgment: Impaired [employer] Scales of Cognitive Functioning: Confused/appropriate    Extremity Assessment (includes Sensation/Coordination)   Upper Extremity Assessment: Generalized weakness RUE Deficits / Details: Able to touch hand to mouth; AAROM WFL wtih min tightness @ shoulder. Unable to touch top of head; increased tremor noted with R hand; decreased fine motor-in-hand manipulation skills RUE Sensation: decreased light touch, decreased proprioception RUE Coordination: decreased fine motor, decreased gross motor LUE Deficits / Details: LUE stronger overall than R; stronger proximally @ 3+/5; shoulder @ 2+/5; able to complete @ 80 FF os shoulder LUE Sensation: decreased light touch, decreased proprioception LUE Coordination: decreased fine motor, decreased gross motor  Lower Extremity Assessment: Defer to PT evaluation     ADLs   Overall ADL's : Needs assistance/impaired Eating/Feeding: Moderate assistance Eating/Feeding Details (indicate cue type and reason): PMV; issued red tubing; fatigue affects aability to self feed Grooming: Moderate assistance, Sitting Grooming Details (indicate cue type and reason): pt making comment "oh no you used that on my butt" pt needed education the wash cloth was clean and not used during peri care. Upper Body Bathing: Maximal assistance, Sitting Lower Body Bathing: Total assistance Upper Body Dressing : Maximal assistance Lower Body Dressing: Total assistance Lower Body Dressing Details (indicate cue type and reason): did initiate lifting R LE for don of sock Toilet Transfer Details (indicate cue type and reason): incontinence with  brief at this time Functional mobility during ADLs: Maximal assistance, +2 for safety/equipment, +2 for physical assistance [name]) General ADL Comments: transfer from bed to chair with sliding board this session. Total +2 max (A)     Mobility   Overal bed mobility: Needs Assistance Bed Mobility: Supine to Sit Rolling: +2 for physical assistance, Mod assist Sidelying to sit: Max assist, HOB elevated Supine to sit: Mod assist, +2 for physical assistance, HOB elevated Sit to supine: Max assist, +2 for physical assistance General bed mobility comments: increased time and multimodal cues for initiation to move legs off bed, encouragement to try to scoot hips as stated he couldn't then he did.  Mod A for trunk elevation and finishing scooting out to EOB     Transfers   Overall transfer level: Needs assistance Transfer via Lift Equipment: Stedy Transfers: Sit to/from Stand, Risk manager Sit to Stand: Max assist, +2 physical assistance, From elevated surface (gait belt and bed pad used for anterior weight shift) Stand pivot transfers: Total assist  Lateral/Scoot Transfers: Total assist, +2 physical assistance, With slide board General transfer comment: up to attempt to stand x 3, on third try able to get hips up off bed and place flaps down on Stedy; moved on Stedy to recliner then max A+2 to get up enough to remove flaps and sit in recliner.     Ambulation / Gait / Stairs / Wheelchair Mobility   Ambulation/Gait General Gait Details: unable at this time     Posture / Balance Dynamic Sitting Balance Sitting balance - Comments: sitting EOB with close S to CGA occasional min A OT playing "The Monkeys" and pt engaged with trunk movements with close S to CGA. Balance Overall balance assessment: Needs assistance, History of Falls Sitting-balance support: Feet supported, Single extremity supported, Bilateral upper extremity supported Sitting balance-Leahy Scale: Poor Sitting balance -  Comments: sitting EOB with close S to CGA occasional min A OT playing "The Monkeys" and pt engaged with trunk movements with close S to CGA. Postural control: Right lateral lean     Special needs/care consideration Oxygen Room Air , Diabetic management Yes h/o DM    Previous Home Environment (from acute therapy documentation) Living Arrangements: Spouse/significant other  Lives With: Spouse Available Help at Discharge: Family, Available 24 hours/day Type of Home: House Home Layout: One level Home Access: Level entry Bathroom Shower/Tub: Multimedia programmer: Basile: No Additional Comments: does have basement he would go down 2x/day   Discharge Living Setting Plans for Discharge Living Setting: Patient's home, House, Lives with (comment) (Lives with wife.) Type of Home at Discharge: House Discharge Home Layout: One level Discharge Home Access: Stairs to enter Entrance Stairs-Rails: None Entrance Stairs-Number of Steps: 1 small step from [place]. Discharge Bathroom Shower/Tub: Walk-in shower, Door Discharge Bathroom Toilet: Handicapped height Discharge Bathroom Accessibility: Yes How Accessible: Accessible via walker Does the patient have any problems obtaining your medications?: No   Social/Family/Support Systems Patient Roles: Spouse, Parent (Has a wife, son and daughter.) Contact Information: Hadley Detloff - wife - [phone] Anticipated Caregiver: Wife, son, daughter, and niece Ability/Limitations of Caregiver: Wife is not working and can assist.  son can take 5 weeks FMLA to assist after DC as needed.  Dtr can stop in/out to assist. Caregiver Availability: 24/7 Discharge Plan Discussed with Primary Caregiver: Yes Is Caregiver In Agreement with Plan?: Yes Does Caregiver/Family have Issues with Lodging/Transportation while Pt is in Rehab?: No   Goals Patient/Family Goal for Rehab: PT/OT/SLP supervision to min assist goals Expected length of  stay: 10-14 days Cultural Considerations: None Pt/Family Agrees to Admission and willing to participate: Yes Program Orientation Provided & Reviewed with Pt/Caregiver Including Roles  & Responsibilities: Yes   Decrease burden of Care through IP rehab admission: N/A   Possible need for SNF placement upon discharge: Not anticipated   Patient Condition: I have reviewed medical records from [facility], spoken with CM, and patient and spouse. I met with patient at the bedside for inpatient rehabilitation assessment.  Patient will benefit from ongoing PT, OT, and SLP, can actively participate in 3 hours of therapy a day 5 days of the week,  and can make measurable gains during the admission.  Patient will also benefit from the coordinated team approach during an Inpatient Acute Rehabilitation admission.  The patient will receive intensive therapy as well as Rehabilitation physician, nursing, social worker, and care management interventions.  Due to bladder management, bowel management, safety, skin/wound care, disease management, medication administration, pain management, patient education, and Trach care education  the patient requires 24 hour a day rehabilitation nursing.  The patient is currently mod+2 with mobility and basic ADLs.  Discharge setting and therapy post discharge at home with home health is anticipated.  Patient has agreed to participate in the Acute Inpatient Rehabilitation Program and will admit today.   Preadmission Screen Completed By:  Retta Diones, 02/16/2021 3:30 PM ______________________________________________________________________   Discussed status with Dr. Posey Pronto on 03/01/21 at 81 and received approval for admission today.   Admission Coordinator:  Retta Diones, RN, time 1030 Sudie Grumbling 03/01/2021   Assessment/Plan: Diagnosis: TBI Does the need for close, 24 hr/day Medical supervision in concert with the patient's rehab needs make it unreasonable for this patient to be  served in a less intensive setting? Yes Co-Morbidities requiring supervision/potential complications: diabetes mellitus, hyperlipidemia and hypertension Due to bowel management, safety, skin/wound care, disease management, and patient education, does the patient require 24 hr/day rehab nursing? Yes Does the patient require coordinated care of a physician, rehab nurse, PT, OT to address physical and functional deficits in the context of the above medical diagnosis(es)? Yes Addressing deficits in the following areas: balance, endurance, locomotion, strength, transferring, bathing, dressing, toileting, and psychosocial support Can the patient actively participate in an intensive therapy program of at least 3 hrs of therapy 5 days a week? Yes The potential for patient to make measurable gains while on inpatient rehab is excellent Anticipated functional outcomes upon discharge from inpatient rehab: supervision and min assist PT, supervision and min assist OT, n/a SLP Estimated rehab length of stay to reach the above functional goals is: 10-14 days. Anticipated discharge destination: Home 10. Overall Rehab/Functional Prognosis: good     MD Signature: Delice Lesch, MD, ABPMR

## 2021-03-01 NOTE — Progress Notes (Signed)
Inpatient Rehab Admissions Coordinator:   I have a bed for this Pt. On CIR today. I will plan to admit. RN may call report to [phone] after 12pm.  Clemens Catholic, [place], [place] Admissions Coordinator  [phone] (celll) [phone] (office)

## 2021-03-01 NOTE — H&P (Signed)
Physical Medicine and Rehabilitation Admission H&P     HPI: Angel [name] is a 70 year old right-handed male with history of diabetes mellitus, hyperlipidemia and hypertension.  History taken from chart review and patient.  Patient lives with spouse.  1 level home with level entry.  Independent prior to admission.  He presented on 12/28/2020 after a fall down approximately 8 steps while going down to his basement with +LOC.  Patient reportedly agitated in route confused with emesis x2.  He did have blood coming from his right ear.  He required intubation for airway protection.  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  Additional extra-axial hemorrhage over the right convexity.  Areas of subarachnoid hemorrhage in the anterior frontal lobes bilaterally with blood along the falx likely subdural.  Minimally displaced right occipital skull fracture.  A longitudinal right temporal bone fracture extends through the right middle ear cavity.  CT cervical spine negative.  CT of chest abdomen pelvis showed no evidence of acute trauma.  There was an incidental finding of 2.9 cm heterogeneous right thyroid nodule present recommend thyroid ultrasound as outpatient.  Admission chemistries unremarkable except potassium 3.3, glucose 166, alcohol negative, hemoglobin 10.8, lactic acid 4.0.  Neurosurgery follow-up for [facility] Dr. Annette Stable recommended conservative care.  Maintained on Keppra x7 days for seizure prophylaxis.  ENT Dr. Constance Holster in regards to temporal bone fractures again advised conservative care.  Hospital course cardiology services consulted 01/07/2021 for evaluation of atrial fibrillation with RVR and started on amiodarone with bolus load.  Echocardiogram with ejection fraction of 60 to 65%, no wall motion abnormalities.  CT angiogram of the chest showed bilateral pulmonary emboli most significantly affecting the right lower lobe pulmonary  artery with associated reduced perfusion of the posterior medial basilar segments of the right lower lobe.  Vascular ultrasound left upper extremity findings consistent with age-indeterminate superficial vein thrombosis involving the left cephalic vein and left basilic vein on 6/76/1950.  Patient underwent placement of tracheostomy 01/14/2021 per Dr.Lovick.  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  His tracheostomy tube was downsized to a #6 cuffless 02/05/2021 and to a #4 cuffless 02/20/2021 and decannulated 02/26/2021.  Hospital course complicated by AKI with nephrology services consulted 01/13/2021 with creatinine 4.5-4.96 felt most likely secondary to hypotensive episode suspect hospital course sepsis and IV contrast.  He did require short course of hemodialysis and tunneled catheter had been placed with latest hemodialysis 02/08/2021 and AKI resolved with latest creatinine 1.06 and tunneled catheter has since been removed.  Initially with nasogastric tube feeds for nutritional support and his diet has been advanced to a regular consistency.  Hospital course acute blood loss anemia transfuse 1 unit packed red blood cells 02/04/2021 with latest hemoglobin 8.4.  Palliative care has been consulted to establish goals of care.  Therapy evaluations completed due to patient's TBI/SAH was admitted for a comprehensive rehab program.  Patient with resulting functional deficits with mobility and self-care.  Please see preadmission assessment from earlier today as well.  Review of Systems  Constitutional:  Negative for chills and fever.  HENT:  Negative for hearing loss.   Eyes:  Negative for blurred vision and double vision.  Respiratory:  Negative for cough  and shortness of breath.    Cardiovascular:  Negative for chest pain, palpitations and leg swelling.  Gastrointestinal:  Positive for constipation. Negative for heartburn, nausea and vomiting.  Genitourinary:  Negative for dysuria and flank pain.  Musculoskeletal:  Positive for joint pain. Negative for back pain and myalgias.  Skin:  Negative for rash.  Neurological:  Positive for weakness.  Psychiatric/Behavioral:  The patient has insomnia.   All other systems reviewed and are negative. Past Medical History:  Diagnosis Date   DM (diabetes mellitus) ([place])    HLD (hyperlipidemia)    Hypertension    Past Surgical History:  Procedure Laterality Date   IR FLUORO GUIDE CV LINE RIGHT  02/04/2021   IR REMOVAL TUN CV CATH W/O FL  02/14/2021   IR US GUIDE VASC ACCESS RIGHT  02/04/2021   TRACHEOSTOMY TUBE PLACEMENT N/A 01/14/2021   Procedure: TRACHEOSTOMY;  Surgeon: Jesusita Oka, MD;  Location: [place];  Service: General;  Laterality: N/A;   History reviewed. No pertinent family history. Social History:  reports that he has never smoked. He has never used smokeless tobacco. No history on file for alcohol use and drug use. Allergies: No Known Allergies Medications Prior to Admission  Medication Sig Dispense Refill   acetaminophen (TYLENOL 8 HOUR ARTHRITIS PAIN) 650 MG CR tablet Take 650 mg by mouth every 8 (eight) hours as needed for pain.     allopurinol (ZYLOPRIM) 300 MG tablet Take 300 mg by mouth at bedtime.     aspirin EC 81 MG tablet Take 81 mg by mouth at bedtime. Swallow whole.     Coenzyme Q10 (COQ10) 100 MG CAPS Take 100 mg by mouth at bedtime.     diphenhydrAMINE (BENADRYL) 25 MG tablet Take 25 mg by mouth at bedtime as needed (congestion).     Flaxseed, Linseed, (FLAX SEED OIL) 1000 MG CAPS Take 1,000 mg by mouth 2 (two) times daily.     fluticasone (FLONASE) 50 MCG/ACT nasal spray Place 2 sprays into both nostrils daily as needed for allergies or rhinitis (congestion).     glipiZIDE (GLUCOTROL XL) 2.5 MG 24  hr tablet Take 2.5 mg by mouth daily with breakfast.     GLUCOSAMINE-CHONDROITIN-MSM PO Take 1 tablet by mouth daily.     ibuprofen (ADVIL) 200 MG tablet Take 200-400 mg by mouth every 6 (six) hours as needed for headache (pain).     linagliptin (TRADJENTA) 5 MG TABS tablet Take 5 mg by mouth every morning.     metFORMIN (GLUCOPHAGE) 1000 MG tablet Take 1,000 mg by mouth 2 (two) times daily.     Omega-3 Fatty Acids (FISH OIL) 1200 MG CAPS Take 1,200 mg by mouth 2 (two) times daily.     omeprazole (PRILOSEC) 20 MG capsule Take 20 mg by mouth at bedtime.     pioglitazone (ACTOS) 45 MG tablet Take 45 mg by mouth at bedtime.     quinapril (ACCUPRIL) 40 MG tablet Take 40 mg by mouth at bedtime.     rosuvastatin (CRESTOR) 10 MG tablet Take 10 mg by mouth at bedtime.      Drug Regimen Review Drug regimen was reviewed and remains appropriate with no significant issues identified.  Home: Home Living Family/patient expects to be discharged to:: Private residence Living Arrangements: Spouse/significant other Available Help at Discharge: Family, Available 24 hours/day Type of Home: House Home Access: Level entry Bud: One level Bathroom Shower/Tub: Multimedia programmer: [place]: None Additional Comments: does have  basement he would go down 2x/day  Lives With: Spouse   Functional History: Prior Function Level of Independence: Independent Comments: no AD, watched their grandchildren  Functional Status:  Mobility: Bed Mobility Overal bed mobility: Needs Assistance Bed Mobility: Rolling, Sidelying to Sit Rolling: Min assist Sidelying to sit: Mod assist Supine to sit: Mod assist Sit to supine: Max assist, +2 for physical assistance Sit to sidelying: Min guard General bed mobility comments: cues for technique, assist to lift trunk pt asking for HHA Transfers Overall transfer level: Needs assistance Equipment used: Rolling walker (2 wheeled) Transfer via  Lift Equipment: Stedy Transfers: Sit to/from Stand Sit to Stand: Mod assist, +2 safety/equipment Stand pivot transfers: Mod assist, +2 physical assistance  Lateral/Scoot Transfers: Total assist, +2 physical assistance, With slide board General transfer comment: assist to stand from EOB with +2 A for safety and some lifting help. stood from recliner mod cues for hand placement Ambulation/Gait Ambulation/Gait assistance: Mod assist, +2 safety/equipment Gait Distance (Feet): 12 Feet (&10') Assistive device: Rolling walker (2 wheeled) Gait Pattern/deviations: Step-to pattern, Decreased stride length, Shuffle, Wide base of support, Trunk flexed General Gait Details: assist behind hips for upright posture, walker proximity and balance, chair following behind    ADL: ADL Overall ADL's : Needs assistance/impaired Eating/Feeding: Set up Eating/Feeding Details (indicate cue type and reason): using foam handles Grooming: Set up, Sitting Grooming Details (indicate cue type and reason): in recliner Upper Body Bathing: Supervision/ safety, Set up, Sitting Lower Body Bathing: Moderate assistance, Sitting/lateral leans Upper Body Dressing : Set up, Supervision/safety, Sitting Lower Body Dressing: Moderate assistance, Sitting/lateral leans Lower Body Dressing Details (indicate cue type and reason): did initiate lifting R LE for don of sock Toilet Transfer: Moderate assistance, +2 for physical assistance Toilet Transfer Details (indicate cue type and reason): use of Denna Haggard; simulated from bed(raised) to recliner Toileting- Clothing Manipulation and Hygiene: Total assistance Toileting - Clothing Manipulation Details (indicate cue type and reason): Min A to maintain standing balance in Denna Haggard Functional mobility during ADLs:  (Able to push up from recliner to clear bottom from chair. Mod A to stand upright with RW) General ADL Comments: Completed ADL task with music playing. Able to selectively  attend to task; wife enterred during session and observed how to encourage independence/work on strengthening during task  Cognition: Cognition Overall Cognitive Status: Impaired/Different from baseline Arousal/Alertness: Lethargic Orientation Level: Oriented to person, Oriented to place Attention: Focused Focused Attention: Impaired Focused Attention Impairment: Verbal basic Awareness: Impaired Awareness Impairment: Emergent impairment Problem Solving:  (will continue to assess) Safety/Judgment: Impaired [employer] Scales of Cognitive Functioning: Automatic/appropriate Cognition Arousal/Alertness: Awake/alert Behavior During Therapy: WFL for tasks assessed/performed Overall Cognitive Status: Impaired/Different from baseline Area of Impairment: Orientation, Attention, Memory, Safety/judgement, Problem solving Orientation Level:  (aware he hasn't seen his grandkids since august but couldnt remember the dates) Current Attention Level: Sustained Memory: Decreased short-term memory Following Commands: Follows one step commands with increased time Safety/Judgement: Decreased awareness of safety, Decreased awareness of deficits Awareness: Emergent Problem Solving: Requires verbal cues, Requires tactile cues, Decreased initiation, Slow processing General Comments: wants to go home and come to outpatient, educated in need to improve independence prior to home. Difficult to assess due to: Tracheostomy  Physical Exam: Blood pressure 118/76, pulse 70, temperature 97.6 F (36.4 C), temperature source Axillary, resp. rate 15, height 6\' 2"  (1.88 m), weight 115.7 kg, SpO2 96 %. Physical Exam Vitals reviewed.  Constitutional:      General: He is not in  acute distress.    Appearance: He is obese.  HENT:     Head: Normocephalic and atraumatic.     Right Ear: External ear normal.     Left Ear: External ear normal.     Nose: Nose normal.  Eyes:     General:        Right eye: No  discharge.        Left eye: No discharge.     Extraocular Movements: Extraocular movements intact.  Neck:     Comments: Tracheostomy tube site dressed. Cardiovascular:     Rate and Rhythm: Normal rate and regular rhythm.  Pulmonary:     Effort: Pulmonary effort is normal. No respiratory distress.     Breath sounds: No stridor.  Abdominal:     General: Abdomen is flat.     Comments: Bowel sounds hypoactive  Musculoskeletal:     Cervical back: Normal range of motion and neck supple.     Comments: No edema or tenderness in extremities  Skin:    General: Skin is warm and dry.  Neurological:     Mental Status: He is alert.     Comments: Alert Wife is at bedside.   Makes eye contact with examiner.   Provides his name and age.   Follows simple commands.   He was able to correctly state that he had been married for 50 years.   He cannot recall full events of the fall. Motor: 4 --4/5 throughout  Psychiatric:        Mood and Affect: Mood normal.        Behavior: Behavior normal.    Results for orders placed or performed during the hospital encounter of 12/28/20 (from the past 48 hour(s))  Glucose, capillary     Status: None   Collection Time: 02/27/21  7:55 AM  Result Value Ref Range   Glucose-Capillary 99 70 - 99 mg/dL    Comment: Glucose reference range applies only to samples taken after fasting for at least 8 hours.   Comment 1 Notify RN    Comment 2 Document in Chart   Glucose, capillary     Status: Abnormal   Collection Time: 02/27/21 11:42 AM  Result Value Ref Range   Glucose-Capillary 169 (H) 70 - 99 mg/dL    Comment: Glucose reference range applies only to samples taken after fasting for at least 8 hours.  Glucose, capillary     Status: Abnormal   Collection Time: 02/27/21  3:38 PM  Result Value Ref Range   Glucose-Capillary 243 (H) 70 - 99 mg/dL    Comment: Glucose reference range applies only to samples taken after fasting for at least 8 hours.  Glucose, capillary      Status: Abnormal   Collection Time: 02/27/21  4:24 PM  Result Value Ref Range   Glucose-Capillary 225 (H) 70 - 99 mg/dL    Comment: Glucose reference range applies only to samples taken after fasting for at least 8 hours.  Glucose, capillary     Status: Abnormal   Collection Time: 02/27/21  9:07 PM  Result Value Ref Range   Glucose-Capillary 196 (H) 70 - 99 mg/dL    Comment: Glucose reference range applies only to samples taken after fasting for at least 8 hours.  Renal function panel     Status: Abnormal   Collection Time: 02/28/21  4:35 AM  Result Value Ref Range   Sodium 140 135 - 145 mmol/L   Potassium 4.2 3.5 - 5.1  mmol/L   Chloride 107 98 - 111 mmol/L   CO2 23 22 - 32 mmol/L   Glucose, Bld 113 (H) 70 - 99 mg/dL    Comment: Glucose reference range applies only to samples taken after fasting for at least 8 hours.   BUN 21 8 - 23 mg/dL   Creatinine, Ser 1.06 0.61 - 1.24 mg/dL   Calcium 9.2 8.9 - 10.3 mg/dL   Phosphorus 4.1 2.5 - 4.6 mg/dL   Albumin 2.4 (L) 3.5 - 5.0 g/dL   GFR, Estimated >60 >60 mL/min    Comment: (NOTE) Calculated using the CKD-EPI Creatinine Equation (2021)    Anion gap 10 5 - 15    Comment: Performed at [facility] [address] [place], [place] [address]  CBC     Status: Abnormal   Collection Time: 02/28/21  4:35 AM  Result Value Ref Range   WBC 4.2 4.0 - 10.5 K/uL   RBC 3.01 (L) 4.22 - 5.81 MIL/uL   Hemoglobin 8.4 (L) 13.0 - 17.0 g/dL   HCT 27.7 (L) 39.0 - 52.0 %   MCV 92.0 80.0 - 100.0 fL   MCH 27.9 26.0 - 34.0 pg   MCHC 30.3 30.0 - 36.0 g/dL   RDW 18.8 (H) 11.5 - 15.5 %   Platelets 278 150 - 400 K/uL   nRBC 0.0 0.0 - 0.2 %    Comment: Performed at [facility], [place] [address] [place], [place] [address]  Glucose, capillary     Status: Abnormal   Collection Time: 02/28/21  7:55 AM  Result Value Ref Range   Glucose-Capillary 120 (H) 70 - 99 mg/dL    Comment: Glucose reference range applies only to samples taken after fasting  for at least 8 hours.  Glucose, capillary     Status: Abnormal   Collection Time: 02/28/21 11:35 AM  Result Value Ref Range   Glucose-Capillary 168 (H) 70 - 99 mg/dL    Comment: Glucose reference range applies only to samples taken after fasting for at least 8 hours.  Glucose, capillary     Status: Abnormal   Collection Time: 02/28/21  3:51 PM  Result Value Ref Range   Glucose-Capillary 271 (H) 70 - 99 mg/dL    Comment: Glucose reference range applies only to samples taken after fasting for at least 8 hours.  Glucose, capillary     Status: Abnormal   Collection Time: 02/28/21  9:35 PM  Result Value Ref Range   Glucose-Capillary 207 (H) 70 - 99 mg/dL    Comment: Glucose reference range applies only to samples taken after fasting for at least 8 hours.   No results found.     Medical Problem List and Plan: 1.  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  Antithrombotics: -DVT/anticoagulation:  Pharmaceutical: Other (comment)/Eliquis for pulmonary emboli  -antiplatelet therapy: N/A 3. Pain Management: Neurontin 300 mg twice daily, Robaxin 1000 mg every 8 hours, oxycodone as needed  Monitor with increased exertion, particularly for headaches 4. Mood: Xanax 0.25 mg twice daily as needed  -antipsychotic agents: Seroquel 25 mg nightly 5. Neuropsych: This patient is not capable of making decisions on his own behalf. 6. Skin/Wound Care: Routine skin checks 7. Fluids/Electrolytes/Nutrition: Routine in and outs  CMP ordered 8.  VDRF.  Status post tracheostomy tube 01/14/2021.  Decannulated 02/26/2021  9.  Atrial  fibrillation with RVR.  Follow-up cardiology services.  Amiodarone 200 mg daily, Lopressor 12.5 mg twice daily.    Monitor increase activity 10.  Diabetes mellitus with hyperglycemia.  Currently on Semglee 16 units twice daily.   Check blood sugars before meals and at bedtime.  Patient on Glucotrol 2.5 mg daily, Tradjenta 5 mg daily, Glucophage 1000 mg twice daily, Actos 45 mg daily prior to admission.  Resume oral agents as possible  Monitor increase activity 11.  Acute blood loss anemia.  Continue iron supplement.    CBC ordered 12.  Obesity.  BMI 32.74.  Dietary follow-up 13.  Slow transit constipation.  Senokot 1 tablet daily, MiraLAX daily.   Lavon Paganini Angiulli, PA-C 03/01/2021  I have personally performed a face to face diagnostic evaluation, including, but not limited to relevant history and physical exam findings, of this patient and developed relevant assessment and plan.  Additionally, I have reviewed and concur with the physician assistant's documentation above.  Delice Lesch, MD, ABPMR The patient's status has not changed. Any changes from the pre-admission screening or documentation from the acute chart are noted above.   Delice Lesch, MD, ABPMR

## 2021-03-01 NOTE — Discharge Summary (Signed)
Patient ID: Angel [name] [id number] 07/09/1950 70 y.o.  Admit date: 12/28/2020 Discharge date: 03/01/2021  Admitting Diagnosis: Fall down stairs VDRF  TBI/SAH/SDH  Occipital bone fx  Temporal bone fx extending into middle ear  Right TM Rupture  Hx DM2  Hx HTN  Discharge Diagnosis Patient Active Problem List   Diagnosis Date Noted   Respiratory failure ([place])    Atrial fibrillation with rapid ventricular response ([facility])    Primary hypertension    SAH (subarachnoid hemorrhage) ([place]) 12/28/2020  Fall down stairs 8/12 VDRF  TBI/SAH/SDH  Occipital bone fx  Temporal bone fx extending into middle ear  Right TM Rupture  AFRVR  ABL anemia Bilateral pulmonary embolism  AKI  Hx DM2  Hx HTN  Consultants Dr. Constance Holster, ENT Dr. Annette Stable, NSGY Dr. Gasper Sells - cardiology Dr. Jonnie Finner - renal Palliative care consult  Reason for Admission: Angel [name] is a 70 y.o. male who presented via EMS as a level 1 trauma. Per EMS report patient was at home, going to his basement when he fell down ~8 stairs and hit his head. Reports LOC. Was agitated in route, confused and emesis x 2. On arrival he was sitting up on stretcher and reported he was nauseated. Noted to be hypertensive on initial pressure with HR in the 80's. He had blood coming from his right ear. Appeared to have TM rupture per EDP exam. HA reported, otherwise he did not report any area of pain. EMS reports PMhx of HTN and DM2. He reports that he takes allopurinol at home. No reported blood thinners. NKDA. Occasional alcohol use. No illicit drug use. Tobacco use unknown. He lives at home with his wife. He was intubated in the trauma bay for airway protection. He was brought to the CT scanner.   Procedures Dr. Bobbye Morton, 01/14/21 percutaneous tracheostomy without bronchoscopic assistance  Hospital Course:  Fall down stairs 8/12  VDRF  The patient required intubation due to his head injury.  He had a prolonged stay on the ventilator.  He  ultimately required a trach on 8/29 by Dr. Bobbye Morton.  He was eventually able to be weaned to [employer] and passy muir trials. Trach changed to cuffless #6 on 9/20. There were no XLT #4 cuffless, so downsized to standard #4CL on 10/5.  He was ultimately able to be decannulated on 10/11 and did well following this and stable on room air.  Lurline Idol site was healing well and had no drainage at time of discharge.  TBI/SAH/SDH  NSGY consult by Dr. Annette Stable.  He was found to have a significant frontal lobe injuries and was treated with Keppra x7d for sz ppx.  Once he was awake and able to interact, TBI therapies were ordered for him.  CIR was recommended.  Occipital bone fx  NSGY c/s, Dr. Annette Stable. No intervention warranted.  Temporal bone fx extending into middle ear  ENT c/s, Dr. Constance Holster, no acute treatment, will need re-evaluation of hearing and his facial nerve as an outpatient.   Right TM Rupture  ENT c/s, Dr. Constance Holster.  No intervention.  Follow up as outpatient.  AFRVR  He went into A fib with RVR and cardiology was consulted.  He was ultimately stable on metoprolol 25mg  BID, amiodarone 200 mg BID x1 week, then decrease to 200 mg daily. His metoprolol was able to be decreased to 12.5mg  BID on 10/3 due to hypotension.  His orthostasis had resolved by time of discharge.  ABL anemia 1u PRBC 9/19, hgb stable. Continue  Iron and vitamin c  Bilateral pulmonary embolism  This was found on 8/23 and he was ultimately stable on Eliquis.  AKI He developed significant renal failure initially and required CRRT and eventually IHD. Eventually this resolved and his creatinine normalized and his tunneled HD cath was able to be removed.  He was voiding well at time of discharge.  Hx DM2  SSI. Decreased semglee to 16U BID 9/28. Appreciate DM coordinator assistance.  This was otherwise stable.  Hx HTN  PRN meds, see above for dosages in A fib section.  The patient was medically stable on HD 63 for DC to CIR.  Medications: Per  CIR upon arrival    Follow-up Information     Imogene Burn, PA-C Follow up.   Specialty: Cardiology Why: [place] location - a cardiology follow-up has been arranged for you on Wednesday Mar 06, 2021 11:45 AM (Arrive by 11:30 AM). Selinda Eon is one of the PAs with our cardiology team. Contact information: [address] [place] [address] [phone]         Earnie Larsson, MD Follow up in 1 month(s).   Specialty: Neurosurgery Contact information: [address] [place] [address] [phone]         Izora Gala, MD Follow up in 2 week(s).   Specialty: Otolaryngology Contact information: [address] [place] [address] [phone]         Street, Sharon Mt, MD Follow up in 1 week(s).   Specialty: Family Medicine Contact information: [place] [address] [phone]         Werner Lean, MD .   Specialty: Cardiology Contact information: [address] [place] [address] [phone]                 Signed: Saverio Danker, [facility] 03/01/2021, 11:27 AM Please see Amion for pager number during day hours 7:00am-4:30pm, 7-11:30am on Weekends

## 2021-03-01 NOTE — H&P (Signed)
Physical Medicine and Rehabilitation Admission H&P     HPI: Angel [name] is a 70 year old right-handed male with history of diabetes mellitus, hyperlipidemia and hypertension.  History taken from chart review and patient.  Patient lives with spouse.  1 level home with level entry.  Independent prior to admission.  He presented on 12/28/2020 after a fall down approximately 8 steps while going down to his basement with +LOC.  Patient reportedly agitated in route confused with emesis x2.  He did have blood coming from his right ear.  He required intubation for airway protection.  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  Additional extra-axial hemorrhage over the right convexity.  Areas of subarachnoid hemorrhage in the anterior frontal lobes bilaterally with blood along the falx likely subdural.  Minimally displaced right occipital skull fracture.  A longitudinal right temporal bone fracture extends through the right middle ear cavity.  CT cervical spine negative.  CT of chest abdomen pelvis showed no evidence of acute trauma.  There was an incidental finding of 2.9 cm heterogeneous right thyroid nodule present recommend thyroid ultrasound as outpatient.  Admission chemistries unremarkable except potassium 3.3, glucose 166, alcohol negative, hemoglobin 10.8, lactic acid 4.0.  Neurosurgery follow-up for [facility] Dr. Annette Stable recommended conservative care.  Maintained on Keppra x7 days for seizure prophylaxis.  ENT Dr. Constance Holster in regards to temporal bone fractures again advised conservative care.  Hospital course cardiology services consulted 01/07/2021 for evaluation of atrial fibrillation with RVR and started on amiodarone with bolus load.  Echocardiogram with ejection fraction of 60 to 65%, no wall motion abnormalities.  CT angiogram of the chest showed bilateral pulmonary emboli most significantly affecting the right lower lobe pulmonary  artery with associated reduced perfusion of the posterior medial basilar segments of the right lower lobe.  Vascular ultrasound left upper extremity findings consistent with age-indeterminate superficial vein thrombosis involving the left cephalic vein and left basilic vein on 3/32/9518.  Patient underwent placement of tracheostomy 01/14/2021 per Dr.Lovick.  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  His tracheostomy tube was downsized to a #6 cuffless 02/05/2021 and to a #4 cuffless 02/20/2021 and decannulated 02/26/2021.  Hospital course complicated by AKI with nephrology services consulted 01/13/2021 with creatinine 4.5-4.96 felt most likely secondary to hypotensive episode suspect hospital course sepsis and IV contrast.  He did require short course of hemodialysis and tunneled catheter had been placed with latest hemodialysis 02/08/2021 and AKI resolved with latest creatinine 1.06 and tunneled catheter has since been removed.  Initially with nasogastric tube feeds for nutritional support and his diet has been advanced to a regular consistency.  Hospital course acute blood loss anemia transfuse 1 unit packed red blood cells 02/04/2021 with latest hemoglobin 8.4.  Palliative care has been consulted to establish goals of care.  Therapy evaluations completed due to patient's TBI/SAH was admitted for a comprehensive rehab program.  Patient with resulting functional deficits with mobility and self-care.  Please see preadmission assessment from earlier today as well.  Review of Systems  Constitutional:  Negative for chills and fever.  HENT:  Negative for hearing loss.   Eyes:  Negative for blurred vision and double vision.  Respiratory:  Negative for cough  and shortness of breath.    Cardiovascular:  Negative for chest pain, palpitations and leg swelling.  Gastrointestinal:  Positive for constipation. Negative for heartburn, nausea and vomiting.  Genitourinary:  Negative for dysuria and flank pain.  Musculoskeletal:  Positive for joint pain. Negative for back pain and myalgias.  Skin:  Negative for rash.  Neurological:  Positive for weakness.  Psychiatric/Behavioral:  The patient has insomnia.   All other systems reviewed and are negative. Past Medical History:  Diagnosis Date   DM (diabetes mellitus) ([place])    HLD (hyperlipidemia)    Hypertension    Past Surgical History:  Procedure Laterality Date   IR FLUORO GUIDE CV LINE RIGHT  02/04/2021   IR REMOVAL TUN CV CATH W/O FL  02/14/2021   IR US GUIDE VASC ACCESS RIGHT  02/04/2021   TRACHEOSTOMY TUBE PLACEMENT N/A 01/14/2021   Procedure: TRACHEOSTOMY;  Surgeon: Jesusita Oka, MD;  Location: [facility];  Service: General;  Laterality: N/A;   History reviewed. No pertinent family history. Social History:  reports that he has never smoked. He has never used smokeless tobacco. No history on file for alcohol use and drug use. Allergies: No Known Allergies Medications Prior to Admission  Medication Sig Dispense Refill   acetaminophen (TYLENOL 8 HOUR ARTHRITIS PAIN) 650 MG CR tablet Take 650 mg by mouth every 8 (eight) hours as needed for pain.     allopurinol (ZYLOPRIM) 300 MG tablet Take 300 mg by mouth at bedtime.     aspirin EC 81 MG tablet Take 81 mg by mouth at bedtime. Swallow whole.     Coenzyme Q10 (COQ10) 100 MG CAPS Take 100 mg by mouth at bedtime.     diphenhydrAMINE (BENADRYL) 25 MG tablet Take 25 mg by mouth at bedtime as needed (congestion).     Flaxseed, Linseed, (FLAX SEED OIL) 1000 MG CAPS Take 1,000 mg by mouth 2 (two) times daily.     fluticasone (FLONASE) 50 MCG/ACT nasal spray Place 2 sprays into both nostrils daily as needed for allergies or rhinitis (congestion).     glipiZIDE (GLUCOTROL XL) 2.5 MG 24  hr tablet Take 2.5 mg by mouth daily with breakfast.     GLUCOSAMINE-CHONDROITIN-MSM PO Take 1 tablet by mouth daily.     ibuprofen (ADVIL) 200 MG tablet Take 200-400 mg by mouth every 6 (six) hours as needed for headache (pain).     linagliptin (TRADJENTA) 5 MG TABS tablet Take 5 mg by mouth every morning.     metFORMIN (GLUCOPHAGE) 1000 MG tablet Take 1,000 mg by mouth 2 (two) times daily.     Omega-3 Fatty Acids (FISH OIL) 1200 MG CAPS Take 1,200 mg by mouth 2 (two) times daily.     omeprazole (PRILOSEC) 20 MG capsule Take 20 mg by mouth at bedtime.     pioglitazone (ACTOS) 45 MG tablet Take 45 mg by mouth at bedtime.     quinapril (ACCUPRIL) 40 MG tablet Take 40 mg by mouth at bedtime.     rosuvastatin (CRESTOR) 10 MG tablet Take 10 mg by mouth at bedtime.      Drug Regimen Review Drug regimen was reviewed and remains appropriate with no significant issues identified.  Home: Home Living Family/patient expects to be discharged to:: Private residence Living Arrangements: Spouse/significant other Available Help at Discharge: Family, Available 24 hours/day Type of Home: House Home Access: Level entry [place]: One level Bathroom Shower/Tub: Multimedia programmer: [place]: None Additional Comments: does have  basement he would go down 2x/day  Lives With: Spouse   Functional History: Prior Function Level of Independence: Independent Comments: no AD, watched their grandchildren  Functional Status:  Mobility: Bed Mobility Overal bed mobility: Needs Assistance Bed Mobility: Rolling, Sidelying to Sit Rolling: Min assist Sidelying to sit: Mod assist Supine to sit: Mod assist Sit to supine: Max assist, +2 for physical assistance Sit to sidelying: Min guard General bed mobility comments: cues for technique, assist to lift trunk pt asking for HHA Transfers Overall transfer level: Needs assistance Equipment used: Rolling walker (2 wheeled) Transfer via  Lift Equipment: [place] Transfers: Sit to/from Stand Sit to Stand: Mod assist, +2 safety/equipment Stand pivot transfers: Mod assist, +2 physical assistance  Lateral/Scoot Transfers: Total assist, +2 physical assistance, With slide board General transfer comment: assist to stand from EOB with +2 A for safety and some lifting help. stood from recliner mod cues for hand placement Ambulation/Gait Ambulation/Gait assistance: Mod assist, +2 safety/equipment Gait Distance (Feet): 12 Feet (&10') Assistive device: Rolling walker (2 wheeled) Gait Pattern/deviations: Step-to pattern, Decreased stride length, Shuffle, Wide base of support, Trunk flexed General Gait Details: assist behind hips for upright posture, walker proximity and balance, chair following behind    ADL: ADL Overall ADL's : Needs assistance/impaired Eating/Feeding: Set up Eating/Feeding Details (indicate cue type and reason): using foam handles Grooming: Set up, Sitting Grooming Details (indicate cue type and reason): in recliner Upper Body Bathing: Supervision/ safety, Set up, Sitting Lower Body Bathing: Moderate assistance, Sitting/lateral leans Upper Body Dressing : Set up, Supervision/safety, Sitting Lower Body Dressing: Moderate assistance, Sitting/lateral leans Lower Body Dressing Details (indicate cue type and reason): did initiate lifting R LE for don of sock Toilet Transfer: Moderate assistance, +2 for physical assistance Toilet Transfer Details (indicate cue type and reason): use of Denna Haggard; simulated from bed(raised) to recliner Toileting- Clothing Manipulation and Hygiene: Total assistance Toileting - Clothing Manipulation Details (indicate cue type and reason): Min A to maintain standing balance in Denna Haggard Functional mobility during ADLs:  (Able to push up from recliner to clear bottom from chair. Mod A to stand upright with RW) General ADL Comments: Completed ADL task with music playing. Able to selectively  attend to task; wife enterred during session and observed how to encourage independence/work on strengthening during task  Cognition: Cognition Overall Cognitive Status: Impaired/Different from baseline Arousal/Alertness: Lethargic Orientation Level: Oriented to person, Oriented to place Attention: Focused Focused Attention: Impaired Focused Attention Impairment: Verbal basic Awareness: Impaired Awareness Impairment: Emergent impairment Problem Solving:  (will continue to assess) Safety/Judgment: Impaired [employer] Scales of Cognitive Functioning: Automatic/appropriate Cognition Arousal/Alertness: Awake/alert Behavior During Therapy: WFL for tasks assessed/performed Overall Cognitive Status: Impaired/Different from baseline Area of Impairment: Orientation, Attention, Memory, Safety/judgement, Problem solving Orientation Level:  (aware he hasn't seen his grandkids since august but couldnt remember the dates) Current Attention Level: Sustained Memory: Decreased short-term memory Following Commands: Follows one step commands with increased time Safety/Judgement: Decreased awareness of safety, Decreased awareness of deficits Awareness: Emergent Problem Solving: Requires verbal cues, Requires tactile cues, Decreased initiation, Slow processing General Comments: wants to go home and come to outpatient, educated in need to improve independence prior to home. Difficult to assess due to: Tracheostomy  Physical Exam: Blood pressure 118/76, pulse 70, temperature 97.6 F (36.4 C), temperature source Axillary, resp. rate 15, height 6\' 2"  (1.88 m), weight 115.7 kg, SpO2 96 %. Physical Exam Vitals reviewed.  Constitutional:      General: He is not in  acute distress.    Appearance: He is obese.  HENT:     Head: Normocephalic and atraumatic.     Right Ear: External ear normal.     Left Ear: External ear normal.     Nose: Nose normal.  Eyes:     General:        Right eye: No  discharge.        Left eye: No discharge.     Extraocular Movements: Extraocular movements intact.  Neck:     Comments: Tracheostomy tube site dressed. Cardiovascular:     Rate and Rhythm: Normal rate and regular rhythm.  Pulmonary:     Effort: Pulmonary effort is normal. No respiratory distress.     Breath sounds: No stridor.  Abdominal:     General: Abdomen is flat.     Comments: Bowel sounds hypoactive  Musculoskeletal:     Cervical back: Normal range of motion and neck supple.     Comments: No edema or tenderness in extremities  Skin:    General: Skin is warm and dry.  Neurological:     Mental Status: He is alert.     Comments: Alert Wife is at bedside.   Makes eye contact with examiner.   Provides his name and age.   Follows simple commands.   He was able to correctly state that he had been married for 50 years.   He cannot recall full events of the fall. Motor: 4 --4/5 throughout  Psychiatric:        Mood and Affect: Mood normal.        Behavior: Behavior normal.    Results for orders placed or performed during the hospital encounter of 12/28/20 (from the past 48 hour(s))  Glucose, capillary     Status: None   Collection Time: 02/27/21  7:55 AM  Result Value Ref Range   Glucose-Capillary 99 70 - 99 mg/dL    Comment: Glucose reference range applies only to samples taken after fasting for at least 8 hours.   Comment 1 Notify RN    Comment 2 Document in Chart   Glucose, capillary     Status: Abnormal   Collection Time: 02/27/21 11:42 AM  Result Value Ref Range   Glucose-Capillary 169 (H) 70 - 99 mg/dL    Comment: Glucose reference range applies only to samples taken after fasting for at least 8 hours.  Glucose, capillary     Status: Abnormal   Collection Time: 02/27/21  3:38 PM  Result Value Ref Range   Glucose-Capillary 243 (H) 70 - 99 mg/dL    Comment: Glucose reference range applies only to samples taken after fasting for at least 8 hours.  Glucose, capillary      Status: Abnormal   Collection Time: 02/27/21  4:24 PM  Result Value Ref Range   Glucose-Capillary 225 (H) 70 - 99 mg/dL    Comment: Glucose reference range applies only to samples taken after fasting for at least 8 hours.  Glucose, capillary     Status: Abnormal   Collection Time: 02/27/21  9:07 PM  Result Value Ref Range   Glucose-Capillary 196 (H) 70 - 99 mg/dL    Comment: Glucose reference range applies only to samples taken after fasting for at least 8 hours.  Renal function panel     Status: Abnormal   Collection Time: 02/28/21  4:35 AM  Result Value Ref Range   Sodium 140 135 - 145 mmol/L   Potassium 4.2 3.5 - 5.1  mmol/L   Chloride 107 98 - 111 mmol/L   CO2 23 22 - 32 mmol/L   Glucose, Bld 113 (H) 70 - 99 mg/dL    Comment: Glucose reference range applies only to samples taken after fasting for at least 8 hours.   BUN 21 8 - 23 mg/dL   Creatinine, Ser 1.06 0.61 - 1.24 mg/dL   Calcium 9.2 8.9 - 10.3 mg/dL   Phosphorus 4.1 2.5 - 4.6 mg/dL   Albumin 2.4 (L) 3.5 - 5.0 g/dL   GFR, Estimated >60 >60 mL/min    Comment: (NOTE) Calculated using the CKD-EPI Creatinine Equation (2021)    Anion gap 10 5 - 15    Comment: Performed at [facility] [address], [place], [place] [address]  CBC     Status: Abnormal   Collection Time: 02/28/21  4:35 AM  Result Value Ref Range   WBC 4.2 4.0 - 10.5 K/uL   RBC 3.01 (L) 4.22 - 5.81 MIL/uL   Hemoglobin 8.4 (L) 13.0 - 17.0 g/dL   HCT 27.7 (L) 39.0 - 52.0 %   MCV 92.0 80.0 - 100.0 fL   MCH 27.9 26.0 - 34.0 pg   MCHC 30.3 30.0 - 36.0 g/dL   RDW 18.8 (H) 11.5 - 15.5 %   Platelets 278 150 - 400 K/uL   nRBC 0.0 0.0 - 0.2 %    Comment: Performed at [facility], [place] [address], [place], [place] [address]  Glucose, capillary     Status: Abnormal   Collection Time: 02/28/21  7:55 AM  Result Value Ref Range   Glucose-Capillary 120 (H) 70 - 99 mg/dL    Comment: Glucose reference range applies only to samples taken after fasting  for at least 8 hours.  Glucose, capillary     Status: Abnormal   Collection Time: 02/28/21 11:35 AM  Result Value Ref Range   Glucose-Capillary 168 (H) 70 - 99 mg/dL    Comment: Glucose reference range applies only to samples taken after fasting for at least 8 hours.  Glucose, capillary     Status: Abnormal   Collection Time: 02/28/21  3:51 PM  Result Value Ref Range   Glucose-Capillary 271 (H) 70 - 99 mg/dL    Comment: Glucose reference range applies only to samples taken after fasting for at least 8 hours.  Glucose, capillary     Status: Abnormal   Collection Time: 02/28/21  9:35 PM  Result Value Ref Range   Glucose-Capillary 207 (H) 70 - 99 mg/dL    Comment: Glucose reference range applies only to samples taken after fasting for at least 8 hours.   No results found.     Medical Problem List and Plan: 1.  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  Antithrombotics: -DVT/anticoagulation:  Pharmaceutical: Other (comment)/Eliquis for pulmonary emboli  -antiplatelet therapy: N/A 3. Pain Management: Neurontin 300 mg twice daily, Robaxin 1000 mg every 8 hours, oxycodone as needed  Monitor with increased exertion, particularly for headaches 4. Mood: Xanax 0.25 mg twice daily as needed  -antipsychotic agents: Seroquel 25 mg nightly 5. Neuropsych: This patient is not capable of making decisions on his own behalf. 6. Skin/Wound Care: Routine skin checks 7. Fluids/Electrolytes/Nutrition: Routine in and outs  CMP ordered 8.  VDRF.  Status post tracheostomy tube 01/14/2021.  Decannulated 02/26/2021  9.  Atrial  fibrillation with RVR.  Follow-up cardiology services.  Amiodarone 200 mg daily, Lopressor 12.5 mg twice daily.    Monitor increase activity 10.  Diabetes mellitus with hyperglycemia.  Currently on Semglee 16 units twice daily.   Check blood sugars before meals and at bedtime.  Patient on Glucotrol 2.5 mg daily, Tradjenta 5 mg daily, Glucophage 1000 mg twice daily, Actos 45 mg daily prior to admission.  Resume oral agents as possible  Monitor increase activity 11.  Acute blood loss anemia.  Continue iron supplement.    CBC ordered 12.  Obesity.  BMI 32.74.  Dietary follow-up 13.  Slow transit constipation.  Senokot 1 tablet daily, MiraLAX daily.   Lavon Paganini Angiulli, PA-C 03/01/2021  I have personally performed a face to face diagnostic evaluation, including, but not limited to relevant history and physical exam findings, of this patient and developed relevant assessment and plan.  Additionally, I have reviewed and concur with the physician assistant's documentation above.  Delice Lesch, MD, ABPMR

## 2021-03-01 NOTE — Progress Notes (Signed)
Inpatient Rehabilitation Admission Medication Review by a Pharmacist  A complete drug regimen review was completed for this patient to identify any potential clinically significant medication issues.  High Risk Drug Classes Is patient taking? Indication by Medication  Antipsychotic Yes Seroquel- sleep  Anticoagulant Yes Apixaban- A. Fib/ B/L PE  Antibiotic No   Opioid Yes Oxycodone- injury sustained from fall  Antiplatelet No   Hypoglycemics/insulin Yes  Insulin ss/LA DM  Vasoactive Medication Yes  Lopressor- HTN  Chemotherapy No   Other No      Type of Medication Issue Identified Description of Issue Recommendation(s)  Drug Interaction(s) (clinically significant)     Duplicate Therapy     Allergy     No Medication Administration End Date     Incorrect Dose     Additional Drug Therapy Needed  Allopurinol, linagliptin, Actos, metformin, glipizide XL, baby aspirin, rosuvastatin, quinipril Resume current home medications   Significant med changes from prior encounter (inform family/care partners about these prior to discharge).    Other       Clinically significant medication issues were identified that warrant physician communication and completion of prescribed/recommended actions by midnight of the next day:  No  Name of provider notified for urgent issues identified:   Provider Method of Notification:     Pharmacist comments:   Time spent performing this drug regimen review (minutes):  20 minutes   Theo Reither J Rechel Delosreyes BS, PharmD, BCPS, RPh 03/01/2021 3:53 PM

## 2021-03-01 NOTE — Progress Notes (Addendum)
Patient ID: Angel [name], male   DOB: 02-26-1951, 70 y.o.   MRN: [id number] 46 Days Post-Op   Subjective: No complaints.  Looks great today.  Feels well today  ROS negative except as listed above. Objective: Vital signs in last 24 hours: Temp:  [97.6 F (36.4 C)-98.7 F (37.1 C)] 97.6 F (36.4 C) (10/14 0545) Pulse Rate:  [65-71] 71 (10/14 0748) Resp:  [13-16] 16 (10/14 0748) BP: (106-126)/(65-84) 125/69 (10/14 0748) SpO2:  [96 %-100 %] 96 % (10/14 0748) Weight:  [118.4 kg] 118.4 kg (10/14 0500) Last BM Date: 02/25/21  Intake/Output from previous day: 10/13 0701 - 10/14 0700 In: 600 [P.O.:600] Out: -  Intake/Output this shift: No intake/output data recorded.  Gen:  Alert, NAD HEENT: EOM's intact, pupils equal and round. Decannulated.  Site is clean and covered with gauze Card:  RRR, no M/G/R heard, palpable pedal pulses Pulm:  mild wheezes noted today.  Otherwise sats 96% on RA and normal effort Abd: Soft, protuberant, nontender, +BS Ext:  calves soft and nontender Neuro: MAEs, follows commands Skin: no rashes noted, warm and dry  Lab Results: CBC  Recent Labs    02/28/21 0435  WBC 4.2  HGB 8.4*  HCT 27.7*  PLT 278   BMET Recent Labs    02/27/21 0317 02/28/21 0435  NA 138 140  K 3.9 4.2  CL 107 107  CO2 23 23  GLUCOSE 93 113*  BUN 19 21  CREATININE 1.12 1.06  CALCIUM 9.0 9.2   PT/INR No results for input(s): LABPROT, INR in the last 72 hours. ABG No results for input(s): PHART, HCO3 in the last 72 hours.  Invalid input(s): PCO2, PO2  Studies/Results: No results found.  Anti-infectives: Anti-infectives (From admission, onward)    Start     Dose/Rate Route Frequency Ordered Stop   02/04/21 1548  ceFAZolin (ANCEF) IVPB 2g/100 mL premix  Status:  Discontinued        over 30 Minutes  Continuous PRN 02/04/21 1549 02/09/21 1305   02/04/21 1545  ceFAZolin (ANCEF) IVPB 1 g/50 mL premix        1 g 100 mL/hr over 30 Minutes Intravenous  Once 02/04/21  1458 02/04/21 1600   02/04/21 1543  ceFAZolin (ANCEF) 2-4 GM/100ML-% IVPB       Note to Pharmacy: [place]   : cabinet override      02/04/21 1543 02/04/21 1651   01/11/21 2330  ceFEPIme (MAXIPIME) 2 g in sodium chloride 0.9 % 100 mL IVPB  Status:  Discontinued        2 g 200 mL/hr over 30 Minutes Intravenous Every 24 hours 01/11/21 0711 01/16/21 0907   01/10/21 1645  ampicillin (OMNIPEN) 2 g in sodium chloride 0.9 % 100 mL IVPB  Status:  Discontinued        2 g 300 mL/hr over 20 Minutes Intravenous Every 8 hours 01/10/21 1549 01/16/21 0907   01/09/21 2200  ceFEPIme (MAXIPIME) 2 g in sodium chloride 0.9 % 100 mL IVPB  Status:  Discontinued        2 g 200 mL/hr over 30 Minutes Intravenous Every 12 hours 01/09/21 1458 01/11/21 0711   01/08/21 1515  metroNIDAZOLE (FLAGYL) IVPB 500 mg  Status:  Discontinued        500 mg 100 mL/hr over 60 Minutes Intravenous Every 8 hours 01/08/21 1428 01/10/21 1618   01/03/21 0600  vancomycin (VANCOREADY) IVPB 1250 mg/250 mL  Status:  Discontinued  1,250 mg 166.7 mL/hr over 90 Minutes Intravenous Every 12 hours 01/02/21 1717 01/03/21 0837   01/02/21 1800  vancomycin (VANCOREADY) IVPB 2000 mg/400 mL        2,000 mg 200 mL/hr over 120 Minutes Intravenous  Once 01/02/21 1712 01/02/21 2007   01/02/21 0900  ceFEPIme (MAXIPIME) 2 g in sodium chloride 0.9 % 100 mL IVPB  Status:  Discontinued        2 g 200 mL/hr over 30 Minutes Intravenous Every 8 hours 01/02/21 0849 01/09/21 1458       Assessment/Plan: Fall down stairs 8/12 VDRF - guaifenisen, S/P trach 8/29 by Dr. Bobbye Morton. Has tolerated HTC well, passy muir trials. Trach changed to cuffless #6 on 9/20. There is no XLT #4 cuffless, so downsized to standard #4CL 10/5. Decannulated on 10/11 and doing well TBI/SAH/SDH - NSGY c/s, Dr. Annette Stable. Significant frontal lobe injuries. Keppra x7d for sz ppx (completed) Occipital bone fx - NSGY c/s, Dr. Annette Stable Temporal bone fx extending into middle ear - ENT c/s,  Dr. Constance Holster, no acute treatment, will need re-eval hearing and facial nerve  Right TM Rupture - ENT c/s, Dr. Constance Holster [facility] -  cardiology s/o 9/23 with recs: metoprolol 25mg  BID, amiodarone 200 mg BID x1 week, then decrease to 200 mg daily. Decreased Metoprolol 12.5mg  BID on 10/3 due to hypotension, orthostasis has resolved. ABL anemia- 1u PRBC 9/19, hgb stable. Continue Iron and vitamin c Bilateral pulmonary embolism - Eliquis AKI - Resolved. GFR > 60. Nephrology reports no further need for dialysis. Tunneled catheter out Hx DM2 - SSI. Decreased semglee to 16U BID 9/28. Appreciate DM coordinator assistance  Hx HTN - PRN meds FEN - Reg diet; hypokalemia 10/9 - replace potassium VTE - SCDs, Eliquis ID - off abx, no fevers Dispo - CIR when bed available    LOS: 63 days   Angel Cea, PA-C General Surgery and Trauma See amion for on call provider and page number    03/01/2021

## 2021-03-01 NOTE — TOC Transition Note (Signed)
Transition of Care [facility] - CM/SW Discharge Note   Patient Details  Name: Angel [name] MRN: [id number] Date of Birth: [birth year]  Transition of Care [facility]) CM/SW Contact:  Ella Bodo, RN Phone Number: 03/01/2021, 2:31 PM   Clinical Narrative:    Patient medically stable for discharge today, and has been accepted for admission to [place].  Plan discharge to CIR upon bed availability.   Final next level of care: IP Rehab Facility Barriers to Discharge: Barriers Resolved   Patient Goals and CMS Choice Patient states their goals for this hospitalization and ongoing recovery are:: to go to rehab CMS Medicare.gov Compare Post Acute Care list provided to:: Patient Choice offered to / list presented to : Patient                        Discharge Plan and Services   Discharge Planning Services: CM Consult Post Acute Care Choice: IP Rehab                               Social Determinants of Health (SDOH) Interventions     Readmission Risk Interventions No flowsheet data found.  Reinaldo Raddle, RN, BSN  Trauma/Neuro ICU Case Manager [phone]

## 2021-03-02 DIAGNOSIS — D62 Acute posthemorrhagic anemia: Secondary | ICD-10-CM | POA: Diagnosis not present

## 2021-03-02 DIAGNOSIS — S069X0D Unspecified intracranial injury without loss of consciousness, subsequent encounter: Secondary | ICD-10-CM

## 2021-03-02 DIAGNOSIS — Z9981 Dependence on supplemental oxygen: Secondary | ICD-10-CM | POA: Diagnosis not present

## 2021-03-02 DIAGNOSIS — I2699 Other pulmonary embolism without acute cor pulmonale: Secondary | ICD-10-CM

## 2021-03-02 DIAGNOSIS — K5901 Slow transit constipation: Secondary | ICD-10-CM | POA: Diagnosis not present

## 2021-03-02 DIAGNOSIS — R7309 Other abnormal glucose: Secondary | ICD-10-CM

## 2021-03-02 LAB — GLUCOSE, CAPILLARY
Glucose-Capillary: 114 mg/dL — ABNORMAL HIGH (ref 70–99)
Glucose-Capillary: 167 mg/dL — ABNORMAL HIGH (ref 70–99)
Glucose-Capillary: 232 mg/dL — ABNORMAL HIGH (ref 70–99)
Glucose-Capillary: 224 mg/dL — ABNORMAL HIGH (ref 70–99)

## 2021-03-02 MED ORDER — POLYETHYLENE GLYCOL 3350 17 G PO PACK
17.0000 g | PACK | Freq: Two times a day (BID) | ORAL | Status: DC
  Administered 2021-03-04 – 2021-03-15 (×9): 17 g via ORAL
  Filled 2021-03-02 (×18): qty 1

## 2021-03-02 NOTE — Evaluation (Signed)
Occupational Therapy Assessment and Plan  Patient Details  Name: Angel [name] MRN: [id number] Date of Birth: Oct 11, 1950  OT Diagnosis: abnormal posture, acute pain, cognitive deficits, muscle weakness (generalized), and swelling of limb Rehab Potential: Rehab Potential (ACUTE ONLY): Good ELOS: 2-2.5 weeks   Today's Date: 03/02/2021 OT Individual Time: 1300-1400 OT Individual Time Calculation (min): 60 min     Hospital Problem: Principal Problem:   TBI (traumatic brain injury) Active Problems:   Supplemental oxygen dependent   Labile blood glucose   Acute pulmonary embolism without acute cor pulmonale (HCC)   Past Medical History:  Past Medical History:  Diagnosis Date   DM (diabetes mellitus) ([place])    HLD (hyperlipidemia)    Hypertension    Past Surgical History:  Past Surgical History:  Procedure Laterality Date   IR FLUORO GUIDE CV LINE RIGHT  02/04/2021   IR REMOVAL TUN CV CATH W/O FL  02/14/2021   IR US GUIDE VASC ACCESS RIGHT  02/04/2021   TRACHEOSTOMY TUBE PLACEMENT N/A 01/14/2021   Procedure: TRACHEOSTOMY;  Surgeon: Jesusita Oka, MD;  Location: [place];  Service: General;  Laterality: N/A;    Assessment & Plan Clinical Impression: Angel [name] is a 70 y.o. 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 CT showed R temporal and parietal SAH, SAH anterior frontal lobes  bilaterally. Also sustained right occipital skull fracture, temporal bone fx, right TM rupture, bilateral PE. Intubated 8/12, trach'd 8/29. CRRT 9/1- 9/9.  Pt decannulated 02/26/21.  PMH: DM2, HTN  Patient currently requires max with basic self-care skills secondary to muscle weakness, decreased cardiorespiratoy endurance, unbalanced muscle activation and decreased coordination, decreased visual perceptual skills, decreased problem solving, decreased safety awareness, and decreased memory, and decreased sitting balance, decreased standing balance, decreased postural control, and decreased balance  strategies.  Prior to hospitalization, patient could complete BADL/IADL with independent .  Patient will benefit from skilled intervention to decrease level of assist with basic self-care skills and increase independence with basic self-care skills prior to discharge home with care partner.  Anticipate patient will require 24 hour supervision and follow up home health.  OT - End of Session Activity Tolerance: Tolerates 30+ min activity without fatigue Endurance Deficit: Yes OT Assessment Rehab Potential (ACUTE ONLY): Good OT Barriers to Discharge: Home environment access/layout;Inaccessible home environment;Weight OT Patient demonstrates impairments in the following area(s): Balance;Cognition;Endurance;Motor;Pain;Safety;Sensory;Vision OT Basic ADL's Functional Problem(s): Bathing;Grooming;Dressing;Toileting OT Transfers Functional Problem(s): Toilet;Tub/Shower OT Additional Impairment(s): Fuctional Use of Upper Extremity OT Plan OT Intensity: Minimum of 1-2 x/day, 45 to 90 minutes OT Frequency: 5 out of 7 days OT Duration/Estimated Length of Stay: 2-2.5 weeks OT Treatment/Interventions: Balance/vestibular training;Discharge planning;Pain management;Self Care/advanced ADL retraining;Therapeutic Activities;UE/LE Coordination activities;Visual/perceptual remediation/compensation;Therapeutic Exercise;Skin care/wound managment;Patient/family education;Functional mobility training;Disease mangement/prevention;Cognitive remediation/compensation;Community reintegration;DME/adaptive equipment instruction;Neuromuscular re-education;Psychosocial support;Splinting/orthotics;UE/LE Strength taining/ROM;Wheelchair propulsion/positioning OT Self Feeding Anticipated Outcome(s): S OT Basic Self-Care Anticipated Outcome(s): S OT Toileting Anticipated Outcome(s): S OT Bathroom Transfers Anticipated Outcome(s): S OT Recommendation Patient destination: Home Follow Up Recommendations: Home health OT Equipment  Recommended: Tub/shower seat;To be determined;3 in 1 bedside comode   OT Evaluation Precautions/Restrictions  Precautions Precautions: Fall Precaution Comments: watch BP, mild dizziness in standing, posterior bias in standing Restrictions Weight Bearing Restrictions: No General Chart Reviewed: Yes Family/Caregiver Present: No Vital Signs Therapy Vitals Temp: 98.2 F (36.8 C) Temp Source: Oral Pulse Rate: 71 Resp: 16 BP: 117/67 Patient Position (if appropriate): Lying Oxygen Therapy SpO2: 98 % O2 Device: Room Air Pain Pain Assessment Pain Scale: 0-10 Pain  Score: 2  Pain Location: Hand Pain Orientation: Right Pain Descriptors / Indicators: Aching Pain Intervention(s): Rest Home Living/Prior Functioning Home Living Family/patient expects to be discharged to:: Private residence Living Arrangements: Spouse/significant other Available Help at Discharge: Family, Available 24 hours/day Type of Home: House Home Access: Stairs to enter [employer] of Steps: 1 threshold step Entrance Stairs-Rails: None Home Layout: One level Bathroom Shower/Tub: Multimedia programmer: Handicapped height Bathroom Accessibility: Yes Additional Comments: does have basement he would go down 2x/day  Lives With: Spouse IADL History Education: high school Prior Function Level of Independence: Independent with basic ADLs, Independent with homemaking with ambulation, Independent with gait, Independent with transfers  Able to Take Stairs?: Yes Driving: Yes 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 grandchildren Comments: no AD, watched their grandchildren Vision Baseline Vision/History: 0 No visual deficits Ability to See in Adequate Light: 0 Adequate Patient Visual Report: No change from baseline (mild intermittent blurriness pt experiences in morning in distances; was present PTA) Vision  Assessment?: Vision impaired- to be further tested in functional context Perception  Perception: Within Functional Limits Praxis Praxis: Intact Cognition Overall Cognitive Status: Within Functional Limits for tasks assessed Arousal/Alertness: Awake/alert Year: 2022 Month: October Day of Week: Correct Memory: Impaired Memory Impairment: Decreased recall of new information;Decreased short term memory Decreased Short Term Memory: Verbal basic;Functional basic Immediate Memory Recall: Sock;Blue;Bed Memory Recall Sock: Without Cue Memory Recall Blue: Without Cue Memory Recall Bed: Without Cue Attention: Focused;Alternating Focused Attention: Appears intact Alternating Attention Impairment: Verbal basic;Functional basic Awareness: Impaired Awareness Impairment: Emergent impairment Problem Solving: Impaired Problem Solving Impairment: Verbal complex;Functional complex Safety/Judgment: Impaired Comments: will benefit from further safety assessment Rancho Los Amigos Scales of Cognitive Functioning: Automatic/appropriate Sensation Sensation Light Touch: Appears Intact Coordination Gross Motor Movements are Fluid and Coordinated: No Fine Motor Movements are Fluid and Coordinated: No Heel Shin Test: limited by body habitus and reduced hip IR Motor  Motor Motor: Within Functional Limits Motor - Skilled Clinical Observations: slow to initiate standing activities d/t reduced confidence in strength and balance; posterior bias in standing  Trunk/Postural Assessment  Cervical Assessment Cervical Assessment: Exceptions to [facility] (forward head) Thoracic Assessment Thoracic Assessment: Exceptions to [facility] (rounded shoulders) Lumbar Assessment Lumbar Assessment: Exceptions to [facility] (posterior pelvic tilt) Postural Control Postural Control: Deficits on evaluation (seated control good; standing balance with posterior bias behind heels)  Balance Balance Balance Assessed: Yes Static Sitting  Balance Static Sitting - Balance Support: Bilateral upper extremity supported Static Sitting - Level of Assistance: 5: Stand by assistance Dynamic Sitting Balance Dynamic Sitting - Balance Support: Left upper extremity supported;During functional activity Dynamic Sitting - Level of Assistance: 4: Min assist;5: Stand by assistance (CGA) Dynamic Sitting Balance - Compensations: posterior trunk lean with lift of feet Dynamic Sitting - Balance Activities: Lateral lean/weight shifting;Forward lean/weight shifting;Reaching for objects;Reaching across midline Static Standing Balance Static Standing - Balance Support: Bilateral upper extremity supported Static Standing - Level of Assistance: 3: Mod assist Static Standing - Comment/# of Minutes: heavy posterior bias and LE extensor push into seated surface Dynamic Standing Balance Dynamic Standing - Balance Support: Bilateral upper extremity supported;During functional activity Dynamic Standing - Level of Assistance: 3: Mod assist Dynamic Standing - Balance Activities: Reaching for objects;Reaching across midline;Forward lean/weight shifting;Lateral lean/weight shifting Dynamic Standing - Comments: posterior bias Extremity/Trunk Assessment RUE Assessment RUE Assessment: Exceptions to [facility] Strength Comments: edematous at wrist and mild pain reported. able to move wihtout increase in  pain, elbow ROM WFL, shouler ROM 0-65 LUE Assessment LUE Assessment: Exceptions to [facility] General Strength Comments: hand wrist & elbow WNL; shoulder gross range 0-50  Care Tool Care Tool Self Care Eating   Eating Assist Level: Set up assist    Oral Care    Oral Care Assist Level: Set up assist    Bathing   Body parts bathed by patient: Right arm;Chest;Abdomen;Front perineal area;Right upper leg;Left upper leg;Face Body parts bathed by helper: Left arm;Buttocks;Right lower leg;Left lower leg   Assist Level: Moderate Assistance - Patient 50 - 74%    Upper  Body Dressing(including orthotics)   What is the patient wearing?: Pull over shirt   Assist Level: Maximal Assistance - Patient 25 - 49%    Lower Body Dressing (excluding footwear)   What is the patient wearing?: Incontinence brief;Pants Assist for lower body dressing: Total Assistance - Patient < 25%    Putting on/Taking off footwear   What is the patient wearing?: Non-skid slipper socks Assist for footwear: Total Assistance - Patient < 25%       Care Tool Toileting Toileting activity   Assist for toileting: Total Assistance - Patient < 25%     Care Tool Bed Mobility Roll left and right activity   Roll left and right assist level: Supervision/Verbal cueing    Sit to lying activity   Sit to lying assist level: Supervision/Verbal cueing    Lying to sitting on side of bed activity   Lying to sitting on side of bed assist level: the ability to move from lying on the back to sitting on the side of the bed with no back support.: Supervision/Verbal cueing     Care Tool Transfers Sit to stand transfer   Sit to stand assist level: Moderate Assistance - Patient 50 - 74%    Chair/bed transfer   Chair/bed transfer assist level: Moderate Assistance - Patient 50 - 74%     Toilet transfer   Assist Level: Moderate Assistance - Patient 50 - 74%     Care Tool Cognition  Expression of Ideas and Wants Expression of Ideas and Wants: 4. Without difficulty (complex and basic) - expresses complex messages without difficulty and with speech that is clear and easy to understand  Understanding Verbal and Non-Verbal Content Understanding Verbal and Non-Verbal Content: 4. Understands (complex and basic) - clear comprehension without cues or repetitions   Memory/Recall Ability Memory/Recall Ability : That he or she is in a hospital/hospital unit   Refer to Care Plan for Long Term Goals  SHORT TERM GOAL WEEK 1 OT Short Term Goal 1 (Week 1): Pt will consistently transfer with MIN A to toilet OT  Short Term Goal 2 (Week 1): Pt will thread BLE into pants with AE PRN OT Short Term Goal 3 (Week 1): Pt will groom wiht set up OT Short Term Goal 4 (Week 1): Pt will don shirt wiht MIN A  Recommendations for other services: Therapeutic Recreation  Pet therapy, Stress management, and Outing/community reintegration   Skilled Therapeutic Intervention 1:1. Pt received in bed agreeable to OT after education on OT role/purpose, CIR, ELOS, POC and TBI recovery. Pt reporting dizziness with transitions and BP assessed with VSS and no changes. However, Nystagmus noted when returning to bed. Pt reporting experiencing these symptoms prior to the fall but now they are much worse. Pt completes BADL at sit to stand level in stedy for time and fatigue management. 1 total LOB posteriorly on EOB upin initial sit,  but through rest of session no LOB noted. Supervision from EOB for sit to stand in stedy to transfer to TTB with VC for anteiror weight shift forward for better biomechanics. Pt completes bathing with overall MOD A after education on lateral leans to wash buttocks.Pt would benefit from LHSS nad AE training to improve access to BLE. MAX A to don shirt and total A for LB dressing sit to stand with stedy. Pt tearful at end of session reporting like he felt he "didn't do well enough." Provided support and encouragement. Returned to EOB/bed at end of session with call light in reach and all needs met.  ADL ADL Grooming: Minimal assistance Where Assessed-Grooming: Edge of bed Upper Body Bathing: Minimal assistance Where Assessed-Upper Body Bathing: Shower Lower Body Bathing: Moderate assistance Where Assessed-Lower Body Bathing: Shower Upper Body Dressing: Maximal assistance Where Assessed-Upper Body Dressing: Other (Comment) Lower Body Dressing: Dependent (TTB + Stedy) Where Assessed-Lower Body Dressing: Other (Comment) Mobility  Bed Mobility Bed Mobility: Rolling Right;Right Sidelying to Sit;Sit to  Supine Rolling Right: Supervision/verbal cueing Right Sidelying to Sit: Contact Guard/Touching assist Sit to Supine: Contact Guard/Touching assist;Minimal Assistance - Patient > 75% Transfers Sit to Stand: Moderate Assistance - Patient 50-74% Stand to Sit: Minimal Assistance - Patient > 75%   Discharge Criteria: Patient will be discharged from OT if patient refuses treatment 3 consecutive times without medical reason, if treatment goals not met, if there is a change in medical status, if patient makes no progress towards goals or if patient is discharged from hospital.  The above assessment, treatment plan, treatment alternatives and goals were discussed and mutually agreed upon: by patient  Tonny Branch 03/02/2021, 4:45 PM

## 2021-03-02 NOTE — Discharge Instructions (Addendum)
Inpatient Rehab Discharge Instructions  Angel [name] Discharge date and time: No discharge date for patient encounter.   Activities/Precautions/ Functional Status: Activity: As tolerated Diet: Carb modified Wound Care: Routine skin checks Functional status:  ___ No restrictions     ___ Walk up steps independently ___ 24/7 supervision/assistance   ___ Walk up steps with assistance ___ Intermittent supervision/assistance  ___ Bathe/dress independently ___ Walk with walker     _x__ Bathe/dress with assistance ___ Walk Independently    ___ Shower independently ___ Walk with assistance    ___ Shower with assistance ___ No alcohol     ___ Return to work/school ________  COMMUNITY REFERRALS UPON DISCHARGE:    Outpatient: PT     OT    ST                 Agency: [facility]   Phone:  [phone]             Appointment Date/Time: *Please expect follow-up within 7-10 business days to schedule your appointment. If you have not received follow-up, be sure to contact the site directly.*   Special Instructions: No driving smoking or alcohol   My questions have been answered and I understand these instructions. I will adhere to these goals and the provided educational materials after my discharge from the hospital.  Patient/Caregiver Signature _______________________________ Date __________  Clinician Signature _______________________________________ Date __________  Please bring this form and your medication list with you to all your follow-up doctor's appointments.

## 2021-03-02 NOTE — Evaluation (Addendum)
Speech Language Pathology Assessment and Plan  Patient Details  Name: Angel [name] MRN: [id number] Date of Birth: 09-21-1950  SLP Diagnosis: Cognitive Impairments  Rehab Potential: Good ELOS: 2 weeks  Today's Date: 03/02/2021 SLP Individual Time: 0350-0938 SLP Individual Time Calculation (min): 45 min  Hospital Problem: Principal Problem:   TBI (traumatic brain injury) Active Problems:   Supplemental oxygen dependent   Labile blood glucose   Acute pulmonary embolism without acute cor pulmonale (HCC)  Past Medical History:  Past Medical History:  Diagnosis Date   DM (diabetes mellitus) ([place])    HLD (hyperlipidemia)    Hypertension    Past Surgical History:  Past Surgical History:  Procedure Laterality Date   IR FLUORO GUIDE CV LINE RIGHT  02/04/2021   IR REMOVAL TUN CV CATH W/O FL  02/14/2021   IR US GUIDE VASC ACCESS RIGHT  02/04/2021   TRACHEOSTOMY TUBE PLACEMENT N/A 01/14/2021   Procedure: TRACHEOSTOMY;  Surgeon: Jesusita Oka, MD;  Location: [facility];  Service: General;  Laterality: N/A;    Assessment / Plan / Recommendation Clinical Impression  Angel [name] is a 70 y.o. 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 CT showed R temporal and parietal SAH, SAH anterior frontal lobes  bilaterally. Also sustained right occipital skull fracture, temporal bone fx, right TM rupture, bilateral PE. Intubated 8/12, trach'd 8/29, decannulated 10/11. Pt  NG tube feeds discharged and has since been tolerating a regular/thin diet. PMH: DM2, HTN, Hyperlipidemia.  Pt presents with mild cognitive impairment as evidenced by SLUMS score of 25/30 (WFL = 27+). Pt only error on assessment was in areas of recall, remembering 4/5 words with few minute delayed and recall of 50% of paragraph story elements. Further non-standardized assessment reveal impaired recall of correct age and ages of children which was quite bothersome to patient. Pt oriented x4 and to recent medical events, demonstrates  awareness of current physical and cognitive deficits following TBI but will benefit from ongoing education. Pt is responsible for most higher level cognitive tasks at home with the exception of money management which wife takes care of. Pt demonstrates mild impairment in higher level attention tasks, responds well to verbal cueing. Pt receptive/expressive/motor speech and language all appear WFL for age and function. Pt observed with regular/thin snack which he tolerated with no difficulty or s/s aspiration. Pt will benefit from skilled ST to increase safety and independence with daily routine before discharge home with wife.    Skilled Therapeutic Interventions          Pt participating in [place] Mental Status Examination (SLUMS) as well as further non-standardized assessments for cognitive-linguistic function. Please see above.    SLP Assessment  Patient will need skilled Speech Lanaguage Pathology Services during CIR admission    Recommendations  Medication Administration: Whole meds with liquid Postural Changes and/or Swallow Maneuvers: Seated upright 90 degrees Oral Care Recommendations: Oral care BID Patient destination: Home Follow up Recommendations: None Equipment Recommended: None recommended by SLP    SLP Frequency 3 to 5 out of 7 days   SLP Duration  SLP Intensity  SLP Treatment/Interventions  2 weeks  Minumum of 1-2 x/day, 30 to 90 minutes  Cognitive remediation/compensation;Internal/external aids;Therapeutic Activities;Therapeutic Exercise;Patient/family education;Functional tasks;Cueing hierarchy    Pain Pain Assessment Pain Scale: 0-10 Pain Score: 0-No pain Faces Pain Scale: No hurt  Prior Functioning Cognitive/Linguistic Baseline: Within functional limits Type of Home: House  Lives With: Spouse Available Help at Discharge: Family;Available 24 hours/day  Education: high school Vocation: Retired  Programmer, systems Overall Cognitive Status:  Impaired/Different from baseline Arousal/Alertness: Awake/alert Orientation Level: Oriented X4 Year: 2022 Month: October Day of Week: Correct Attention: Focused;Alternating Focused Attention: Appears intact Alternating Attention: Impaired Alternating Attention Impairment: Verbal basic;Functional basic Memory: Impaired Memory Impairment: Decreased recall of new information;Decreased short term memory Decreased Short Term Memory: Verbal basic;Functional basic Awareness: Appears intact Problem Solving: Impaired Problem Solving Impairment: Verbal complex;Functional complex Safety/Judgment: Impaired Comments: will benefit from further safety assessment [employer] Scales of Cognitive Functioning: Automatic/appropriate  Comprehension Auditory Comprehension Overall Auditory Comprehension: Appears within functional limits for tasks assessed Visual Recognition/Discrimination Discrimination: Not tested Reading Comprehension Reading Status: Not tested Expression Expression Primary Mode of Expression: Verbal Verbal Expression Overall Verbal Expression: Appears within functional limits for tasks assessed Oral Motor Oral Motor/Sensory Function Overall Oral Motor/Sensory Function: Within functional limits Motor Speech Overall Motor Speech: Appears within functional limits for tasks assessed Respiration: Within functional limits Resonance: Within functional limits Articulation: Within functional limitis Intelligibility: Intelligible Motor Planning: Witnin functional limits  Care Tool Care Tool Cognition Ability to hear (with hearing aid or hearing appliances if normally used Ability to hear (with hearing aid or hearing appliances if normally used): 1. Minimal difficulty - difficulty in some environments (e.g. when person speaks softly or setting is noisy)   Expression of Ideas and Wants Expression of Ideas and Wants: 4. Without difficulty (complex and basic) - expresses complex  messages without difficulty and with speech that is clear and easy to understand   Understanding Verbal and Non-Verbal Content Understanding Verbal and Non-Verbal Content: 4. Understands (complex and basic) - clear comprehension without cues or repetitions  Memory/Recall Ability Memory/Recall Ability : That he or she is in a hospital/hospital unit   Short Term Goals: Week 1: SLP Short Term Goal 1 (Week 1): Pt will participate in further cognitive assessment (CLQT, AFLA, etc.) to further direct POC SLP Short Term Goal 2 (Week 1): Pt will increase recall of novel information with 90% accuracy provided min A SLP Short Term Goal 3 (Week 1): Pt will perform alternating attention tasks provided mod A for use of strategies as trained SLP Short Term Goal 4 (Week 1): Pt will complete mildly complex problem solving tasks with 90% accuracy provided min A SLP Short Term Goal 5 (Week 1): Pt will ID 3 cognitive and physical changes as a result of TBI and hospitalization. SLP Short Term Goal 6 (Week 1): Pt will complete med management task with 90% accuracy provided min A  Refer to Care Plan for Long Term Goals  Recommendations for other services: None   Discharge Criteria: Patient will be discharged from SLP if patient refuses treatment 3 consecutive times without medical reason, if treatment goals not met, if there is a change in medical status, if patient makes no progress towards goals or if patient is discharged from hospital.  The above assessment, treatment plan, treatment alternatives and goals were discussed and mutually agreed upon: by patient  Dewaine Conger 03/02/2021, 10:26 AM

## 2021-03-02 NOTE — Evaluation (Signed)
Physical Therapy Assessment and Plan  Patient Details  Name: Angel [name] MRN: [id number] Date of Birth: 03-Oct-1950  PT Diagnosis: Coordination disorder, Difficulty walking, Dizziness and giddiness, and Muscle weakness Rehab Potential: Good ELOS: 11-14 days   Today's Date: 03/02/2021 PT Individual Time: 0807-0905 PT Individual Time Calculation (min): 42 min    Hospital Problem: Principal Problem:   TBI (traumatic brain injury) Active Problems:   Supplemental oxygen dependent   Labile blood glucose   Acute pulmonary embolism without acute cor pulmonale (HCC)   Past Medical History:  Past Medical History:  Diagnosis Date   DM (diabetes mellitus) ([place])    HLD (hyperlipidemia)    Hypertension    Past Surgical History:  Past Surgical History:  Procedure Laterality Date   IR FLUORO GUIDE CV LINE RIGHT  02/04/2021   IR REMOVAL TUN CV CATH W/O FL  02/14/2021   IR US GUIDE VASC ACCESS RIGHT  02/04/2021   TRACHEOSTOMY TUBE PLACEMENT N/A 01/14/2021   Procedure: TRACHEOSTOMY;  Surgeon: Jesusita Oka, MD;  Location: [facility] OR;  Service: General;  Laterality: N/A;    Assessment & Plan Clinical Impression: Patient is a 70 y.o. right-handed male with history of diabetes mellitus, hyperlipidemia and hypertension.  History taken from chart review and patient.  Patient lives with spouse.  1 level home with level entry.  Independent prior to admission.  He presented on 12/28/2020 after a fall down approximately 8 steps while going down to his basement with +LOC.  Patient reportedly agitated in route confused with emesis x2.  He did have blood coming from his right ear.  He required intubation for airway protection.  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  Additional extra-axial hemorrhage over the right convexity.  Areas of subarachnoid hemorrhage in the anterior frontal lobes bilaterally with blood along the falx likely  subdural.  Minimally displaced right occipital skull fracture.  A longitudinal right temporal bone fracture extends through the right middle ear cavity.  CT cervical spine negative.  CT of chest abdomen pelvis showed no evidence of acute trauma.  There was an incidental finding of 2.9 cm heterogeneous right thyroid nodule present recommend thyroid ultrasound as outpatient.  Admission chemistries unremarkable except potassium 3.3, glucose 166, alcohol negative, hemoglobin 10.8, lactic acid 4.0.  Neurosurgery follow-up for [facility] Dr. Annette Stable recommended conservative care.  Maintained on Keppra x7 days for seizure prophylaxis.  ENT Dr. Constance Holster in regards to temporal bone fractures again advised conservative care.  Hospital course cardiology services consulted 01/07/2021 for evaluation of atrial fibrillation with RVR and started on amiodarone with bolus load.  Echocardiogram with ejection fraction of 60 to 65%, no wall motion abnormalities.  CT angiogram of the chest showed bilateral pulmonary emboli most significantly affecting the right lower lobe pulmonary artery with associated reduced perfusion of the posterior medial basilar segments of the right lower lobe.  Vascular ultrasound left upper extremity findings consistent with age-indeterminate superficial vein thrombosis involving the left cephalic vein and left basilic vein on 0/35/5974.  Patient underwent placement of tracheostomy 01/14/2021 per Dr.Lovick.  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  His tracheostomy tube was downsized to a #6 cuffless 02/05/2021 and to a #4 cuffless 02/20/2021 and  decannulated 02/26/2021.  Hospital course complicated by AKI with nephrology services consulted 01/13/2021 with creatinine 4.5-4.96 felt most likely secondary  to hypotensive episode suspect hospital course sepsis and IV contrast.  He did require short course of hemodialysis and tunneled catheter had been placed with latest hemodialysis 02/08/2021 and AKI resolved with latest creatinine 1.06 and tunneled catheter has since been removed.  Initially with nasogastric tube feeds for nutritional support and his diet has been advanced to a regular consistency.  Hospital course acute blood loss anemia transfuse 1 unit packed red blood cells 02/04/2021 with latest hemoglobin 8.4.  Palliative care has been consulted to establish goals of care.  Therapy evaluations completed due to patient's TBI/SAH was admitted for a comprehensive rehab program.  Patient with resulting functional deficits with mobility and self-care.  Patient transferred to CIR on 03/01/2021 .   Patient currently requires mod with mobility secondary to muscle weakness, decreased cardiorespiratoy endurance, impaired timing and sequencing and decreased coordination, decreased motor planning, decreased initiation, decreased awareness, decreased safety awareness, and delayed processing, and decreased standing balance and decreased balance strategies.  Prior to hospitalization, patient was independent  with mobility and lived with Spouse in a House home.  Home access is 1 threshold stepStairs to enter.  Patient will benefit from skilled PT intervention to maximize safe functional mobility, minimize fall risk, and decrease caregiver burden for planned discharge home with 24 hour assist.  Anticipate patient will benefit from follow up [facility] at discharge.  PT - End of Session Activity Tolerance: Tolerates 10 - 20 min activity with multiple rests Endurance Deficit: Yes PT Assessment Rehab Potential (ACUTE/IP ONLY): Good PT Barriers to Discharge: [place] home environment;Home environment access/layout;Incontinence;Wound Care;Lack of/limited family support;Insurance for SNF coverage;Weight;Nutrition means;Decreased  caregiver support PT Patient demonstrates impairments in the following area(s): Balance;Behavior;Edema;Endurance;Motor;Nutrition;Pain;Perception;Safety;Sensory;Skin Integrity PT Transfers Functional Problem(s): Bed Mobility;Bed to Chair;Car;Furniture PT Locomotion Functional Problem(s): Ambulation;Wheelchair Mobility;Stairs PT Plan PT Intensity: Minimum of 1-2 x/day ,45 to 90 minutes PT Frequency: 5 out of 7 days PT Duration Estimated Length of Stay: 11-14 days PT Treatment/Interventions: Ambulation/gait training;Balance/vestibular training;Cognitive remediation/compensation;Community reintegration;Disease management/prevention;Discharge planning;DME/adaptive equipment instruction;Functional electrical stimulation;Functional mobility training;Neuromuscular re-education;Pain management;Patient/family education;Psychosocial support;Stair training;UE/LE Strength taining/ROM;Wheelchair propulsion/positioning;UE/LE Coordination activities;Therapeutic Activities;Skin care/wound management;Splinting/orthotics;Therapeutic Exercise;Visual/perceptual remediation/compensation PT Transfers Anticipated Outcome(s): Close supervision/ CGA PT Locomotion Anticipated Outcome(s): CGA with LRAD PT Recommendation Recommendations for Other Services: Therapeutic Recreation consult Therapeutic Recreation Interventions: Stress management;Outing/community reintergration Follow Up Recommendations: Home health PT;Outpatient PT Patient destination: Home Equipment Recommended: To be determined   PT Evaluation Precautions/Restrictions Precautions Precautions: Fall Precaution Comments: watch BP, mild dizziness in standing, posterior bias in standing Restrictions Weight Bearing Restrictions: No General   Vital SignsTherapy Vitals Pulse Rate: 70 BP: 121/60 Patient Position (if appropriate): Lying Oxygen Therapy SpO2: 96 % O2 Device: Room Air Pain Pain Assessment Pain Scale: 0-10 Pain Score: 2  Faces Pain Scale:  No hurt Pain Type: Acute pain Pain Location: Hand Pain Orientation: Right Pain Descriptors / Indicators: Aching Pain Frequency: Intermittent Pain Onset: On-going Patients Stated Pain Goal: 1 Pain Intervention(s): Rest Pain Interference Pain Interference Pain Effect on Sleep: 1. Rarely or not at all Pain Interference with Therapy Activities: 1. Rarely or not at all Pain Interference with Day-to-Day Activities: 1. Rarely or not at all Home Living/Prior [place] Available Help at Discharge: Family;Available 24 hours/day Type of Home: House Home Access: Stairs to enter [employer] of Steps: 1 threshold step Entrance Stairs-Rails: None Home Layout: One level Bathroom Shower/Tub: Multimedia programmer: Handicapped height  Bathroom Accessibility: Yes Additional Comments: does have basement he would go down 2x/day  Lives With: Spouse Prior Function Level of Independence: Independent with basic ADLs;Independent with homemaking with ambulation;Independent with gait;Independent with transfers  Able to Take Stairs?: Yes Driving: Yes 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 grandchildren Comments: no AD, watched their grandchildren Vision/Perception  Vision - History Ability to See in Adequate Light: 0 Adequate Perception Perception: Within Functional Limits Praxis Praxis: Intact  Cognition Overall Cognitive Status: Within Functional Limits for tasks assessed Arousal/Alertness: Awake/alert Orientation Level: Oriented X4 Year: 2022 Month: October Day of Week: Correct Attention: Focused;Alternating Focused Attention: Appears intact Alternating Attention: Impaired Alternating Attention Impairment: Verbal basic;Functional basic Memory: Impaired Memory Impairment: Decreased recall of new information;Decreased short term memory Decreased Short Term Memory: Verbal  basic;Functional basic Immediate Memory Recall: Sock;Blue;Bed Memory Recall Sock: Without Cue Memory Recall Blue: Without Cue Memory Recall Bed: Without Cue Awareness: Impaired Awareness Impairment: Emergent impairment Problem Solving: Impaired Problem Solving Impairment: Verbal complex;Functional complex Safety/Judgment: Impaired Comments: will benefit from further safety assessment Rancho Los Amigos Scales of Cognitive Functioning: Automatic/appropriate Sensation Sensation Light Touch: Appears Intact Coordination Gross Motor Movements are Fluid and Coordinated: No Fine Motor Movements are Fluid and Coordinated: No Heel Shin Test: limited by body habitus and reduced hip IR Motor  Motor Motor: Within Functional Limits Motor - Skilled Clinical Observations: slow to initiate standing activities d/t reduced confidence in strength and balance; posterior bias in standing   Trunk/Postural Assessment  Cervical Assessment Cervical Assessment: Exceptions to [facility] (forward head) Thoracic Assessment Thoracic Assessment: Exceptions to [facility] (rounded shoulders) Lumbar Assessment Lumbar Assessment: Exceptions to [facility] (posterior pelvic tilt) Postural Control Postural Control: Deficits on evaluation (seated control good; standing balance with posterior bias behind heels)  Balance Balance Balance Assessed: Yes Static Sitting Balance Static Sitting - Balance Support: Bilateral upper extremity supported Static Sitting - Level of Assistance: 5: Stand by assistance Dynamic Sitting Balance Dynamic Sitting - Balance Support: Left upper extremity supported;During functional activity Dynamic Sitting - Level of Assistance: 4: Min assist;5: Stand by assistance (CGA) Dynamic Sitting Balance - Compensations: posterior trunk lean with lift of feet Dynamic Sitting - Balance Activities: Lateral lean/weight shifting;Forward lean/weight shifting;Reaching for objects;Reaching across midline Static Standing  Balance Static Standing - Balance Support: Bilateral upper extremity supported Static Standing - Level of Assistance: 3: Mod assist Static Standing - Comment/# of Minutes: heavy posterior bias and LE extensor push into seated surface Dynamic Standing Balance Dynamic Standing - Balance Support: Bilateral upper extremity supported;During functional activity Dynamic Standing - Level of Assistance: 3: Mod assist Dynamic Standing - Balance Activities: Reaching for objects;Reaching across midline;Forward lean/weight shifting;Lateral lean/weight shifting Dynamic Standing - Comments: posterior bias Extremity Assessment      RLE Assessment RLE Assessment: Exceptions to [facility] RLE Strength Right Hip Flexion: 4-/5 Right Hip Extension: 4/5 Right Hip ABduction: 4/5 Right Hip ADduction: 4-/5 Right Knee Flexion: 4/5 Right Knee Extension: 4/5 Right Ankle Dorsiflexion: 4-/5 Right Ankle Plantar Flexion: 4-/5 LLE Assessment LLE Assessment: Exceptions to WFL LLE Strength LLE Overall Strength: Deficits Left Hip Flexion: 4-/5 Left Hip Extension: 4/5 Left Hip ABduction: 4/5 Left Hip ADduction: 4-/5 Left Knee Flexion: 4-/5 Left Knee Extension: 4/5 Left Ankle Dorsiflexion: 4-/5 Left Ankle Plantar Flexion: 4-/5  Care Tool Care Tool Bed Mobility Roll left and right activity   Roll left and right assist level: Supervision/Verbal cueing    Sit to lying activity   Sit to lying assist  level: Supervision/Verbal cueing    Lying to sitting on side of bed activity   Lying to sitting on side of bed assist level: the ability to move from lying on the back to sitting on the side of the bed with no back support.: Supervision/Verbal cueing     Care Tool Transfers Sit to stand transfer   Sit to stand assist level: Moderate Assistance - Patient 50 - 74%    Chair/bed transfer   Chair/bed transfer assist level: Moderate Assistance - Patient 50 - 74%     Toilet transfer   Assist Level: Moderate Assistance -  Patient 50 - 74%    Car transfer   Car transfer assist level: Moderate Assistance - Patient 50 - 74%      Care Tool Locomotion Ambulation   Assist level: Moderate Assistance - Patient 50 - 74% Assistive device: Walker-rolling Max distance: 3 ft  Walk 10 feet activity Walk 10 feet activity did not occur: Safety/medical concerns       Walk 50 feet with 2 turns activity Walk 50 feet with 2 turns activity did not occur: Safety/medical concerns      Walk 150 feet activity Walk 150 feet activity did not occur: Safety/medical concerns      Walk 10 feet on uneven surfaces activity Walk 10 feet on uneven surfaces activity did not occur: Safety/medical concerns      Stairs Stair activity did not occur: Safety/medical concerns        Walk up/down 1 step activity Walk up/down 1 step or curb (drop down) activity did not occur: Safety/medical concerns     Walk up/down 4 steps activity did not occuR: Safety/medical concerns  Walk up/down 4 steps activity      Walk up/down 12 steps activity Walk up/down 12 steps activity did not occur: Safety/medical concerns      Pick up small objects from floor Pick up small object from the floor (from standing position) activity did not occur: Safety/medical concerns      Wheelchair Is the patient using a wheelchair?: No Type of Wheelchair: Manual   Wheelchair assist level: Total Assistance - Patient < 25% Max wheelchair distance: >200 ft  Wheel 50 feet with 2 turns activity   Assist Level: Total Assistance - Patient < 25%  Wheel 150 feet activity   Assist Level: Total Assistance - Patient < 25%    Refer to Care Plan for Long Term Goals  SHORT TERM GOAL WEEK 1 PT Short Term Goal 1 (Week 1): Pt will demonstrate improved standing balance requiring up to CGA/ MinA to maintain with RW for at least 3 min. PT Short Term Goal 2 (Week 1): Pt will perform sit <> stands with MinA to RW. PT Short Term Goal 3 (Week 1): Pt will perform stand pivot  transfers using RW with MinA for posterior bias. PT Short Term Goal 4 (Week 1): Pt will ambulate at least 30 ft using RW with MinA.  Recommendations for other services: Surveyor, mining group, Stress management, and Outing/community reintegration  Skilled Therapeutic Intervention Mobility Bed Mobility Bed Mobility: Rolling Right;Right Sidelying to Sit;Sit to Supine Rolling Right: Supervision/verbal cueing Right Sidelying to Sit: Contact Guard/Touching assist Sit to Supine: Contact Guard/Touching assist;Minimal Assistance - Patient > 75% Transfers Transfers: Sit to Stand;Stand to Sit;Stand Pivot Transfers Sit to Stand: Moderate Assistance - Patient 50-74% Stand to Sit: Minimal Assistance - Patient > 75% Stand Pivot Transfers: Moderate Assistance - Patient 50 - 74% Stand Pivot Transfer Details:  Tactile cues for weight shifting;Tactile cues for posture;Verbal cues for technique;Verbal cues for precautions/safety;Verbal cues for safe use of DME/AE Transfer (Assistive device): Rolling walker Locomotion  Gait Ambulation: Yes Gait Assistance: Moderate Assistance - Patient 50-74% Gait Distance (Feet): 3 Feet Assistive device: Rolling walker Gait Assistance Details: Tactile cues for weight shifting;Visual cues/gestures for sequencing;Verbal cues for sequencing;Verbal cues for technique;Verbal cues for precautions/safety;Verbal cues for safe use of DME/AE Gait Gait: Yes Gait Pattern: Step-to pattern;Decreased step length - right;Decreased step length - left Gait velocity: decreased Stairs / Additional Locomotion Stairs: No Wheelchair Mobility Wheelchair Mobility: No  PT Evaluation completed; see above for results. PT educated patient in roles of PT vs OT, PT POC, rehab potential, rehab goals, and discharge recommendations along with recommendation for follow-up rehabilitation services. Individual treatment initiated:  Patient supine in bed upon PT arrival. Wife present in  room. Patient alert and agreeable to PT session. No pain complaint during session. Appropriate sized w/c acquired and provided.   Therapeutic Activity: Bed Mobility: Patient performed supine to/from sit EOB to with light CGA. Pt demos ability to roll to R side, then push up to seated position on EOB with BUE. Provided verbal cues for effort and breathing. While seated EOB, Pt dresses with Min/ ModA for donning pants. With extra time, he is able to discern front/ back and top/ bottom of shirt and dons with CGA. Seated balance good and no instance of dizziness or LOB.  Transfers: Patient performed sit <> stand and stand pivot transfers throughout session with modA d/t heavy posterior bias. Attempt with NMR and insight into holding balance when acquired with pt unable to maintain. Noted toes extending upward.  Provided vc/tc for keeping whole foot on floor, forward lean, forward placement of walker. Car transfer completed with [place] and continued posterior bias.   At end of session, pt asks why he felt dizzy when he stood up sometimes. Education provided re: time needed for body to readjust to changes in position especially in standing as he has spent a lot of time since August lying down. Recommended to pt to spend time either in bed with HOB elevated or spend short periods of time sitting up in recliner or w/c in order to slowly reacclimate body to being up again.   Patient supine  in bed at end of session with brakes locked, bed alarm set, and all needs within reach.    Discharge Criteria: Patient will be discharged from PT if patient refuses treatment 3 consecutive times without medical reason, if treatment goals not met, if there is a change in medical status, if patient makes no progress towards goals or if patient is discharged from hospital.  The above assessment, treatment plan, treatment alternatives and goals were discussed and mutually agreed upon: by patient and by family  Alger Simons 03/02/2021, 1:49 PM

## 2021-03-02 NOTE — Progress Notes (Signed)
[place] PHYSICAL MEDICINE & REHABILITATION PROGRESS NOTE  Subjective/Complaints: Patient seen sitting up in bed this morning.  Angel [name] states Angel [name] slept well overnight.  Angel [name] at bedside.  She notes constipation.  ROS: Denies CP, SOB, N/V/D  Objective: Vital Signs: Blood pressure 131/89, pulse 63, temperature 98.5 F (36.9 C), temperature source Oral, resp. rate 16, height 5\' 8"  (1.727 m), weight 113.9 kg, SpO2 100 %. No results found. Recent Labs    02/28/21 0435  WBC 4.2  HGB 8.4*  HCT 27.7*  PLT 278   Recent Labs    02/28/21 0435  NA 140  K 4.2  CL 107  CO2 23  GLUCOSE 113*  BUN 21  CREATININE 1.06  CALCIUM 9.2    Intake/Output Summary (Last 24 hours) at 03/02/2021 0944 Last data filed at 03/02/2021 0748 Gross per 24 hour  Intake 120 ml  Output --  Net 120 ml        Physical Exam: BP 131/89 (BP Location: Right Arm)   Pulse 63   Temp 98.5 F (36.9 C) (Oral)   Resp 16   Ht 5\' 8"  (1.727 m)   Wt 113.9 kg   SpO2 100%   BMI 38.16 kg/m  Constitutional: No distress . Vital signs reviewed.  Obese. HENT: Normocephalic.  Atraumatic.  + Stoma. Eyes: EOMI. No discharge. Cardiovascular: No JVD.  RRR. Respiratory: Normal effort.  No stridor.  Bilateral clear to auscultation.  + [place]. GI: Non-distended.  BS +. Skin: Warm and dry.  Intact. Psych: Normal mood.  Slowed.  Delayed. Musc: No edema in extremities.  No tenderness in extremities. Neuro: Alert Angel [name] is at bedside.   Makes eye contact with examiner.   Provides his name and age.   Follows simple commands.   Angel [name] cannot recall full events of the fall. Motor: 4 --4/5 throughout, stable  Assessment/Plan: 1. Functional deficits which require 3+ hours per day of interdisciplinary therapy in a comprehensive inpatient rehab setting. Physiatrist is providing close team supervision and 24 hour management of active medical problems listed below. Physiatrist and rehab team continue to assess barriers to discharge/monitor  patient progress toward functional and medical goals   Care Tool:  Bathing              Bathing assist       Upper Body Dressing/Undressing Upper body dressing   What is the patient wearing?: Hospital gown only    Upper body assist Assist Level: Supervision/Verbal cueing    Lower Body Dressing/Undressing Lower body dressing      What is the patient wearing?: Incontinence brief     Lower body assist       Toileting Toileting    Toileting assist Assist for toileting: Total Assistance - Patient < 25%     Transfers Chair/bed transfer  Transfers assist           Locomotion Ambulation   Ambulation assist              Walk 10 feet activity   Assist           Walk 50 feet activity   Assist           Walk 150 feet activity   Assist           Walk 10 feet on uneven surface  activity   Assist           Wheelchair     Assist  Wheelchair 50 feet with 2 turns activity    Assist            Wheelchair 150 feet activity     Assist          Medical Problem List and Plan: 1.  TBI/SAH/occipital and temporal bone fracture/right TM rupture secondary to fall 12/28/2020 down approximately 8 steps  Begin CIR evaluations 2.  Antithrombotics: -DVT/anticoagulation:  Pharmaceutical: Other (comment)/Eliquis for pulmonary emboli             -antiplatelet therapy: N/A 3. Pain Management: Neurontin 300 mg twice daily, Robaxin 1000 mg every 8 hours, oxycodone as needed             Monitor for headaches with increased exertion 4. Mood: Xanax 0.25 mg twice daily as needed             -antipsychotic agents: Seroquel 25 mg nightly 5. Neuropsych: This patient is not capable of making decisions on his own behalf. 6. Skin/Wound Care: Routine skin checks 7. Fluids/Electrolytes/Nutrition: Routine in and outs             CMP ordered for Monday 8.  VDRF.  Status post tracheostomy tube 01/14/2021.   Decannulated 02/26/2021  9.  Atrial fibrillation with RVR.  Follow-up cardiology services.  Amiodarone 200 mg daily, Lopressor 12.5 mg twice daily.               Monitor with increased activity 10.  Diabetes mellitus with hyperglycemia.  Currently on Semglee 16 units twice daily.  Check blood sugars before meals and at bedtime.  Patient on Glucotrol 2.5 mg daily, Tradjenta 5 mg daily, Glucophage 1000 mg twice daily, Actos 45 mg daily prior to admission.  Resume oral agents as possible  Labile on 10/15             Monitor increase mobility 11.  Acute blood loss anemia.  Continue iron supplement.               Hemoglobin 8.4 on 10/13 CBC ordered for Monday 12.  Obesity.  BMI 32.74.  Dietary follow-up 13.  Slow transit constipation.  Senokot 1 tablet daily, MiraLAX daily.  Bowel meds increased on 10/15 14.?  OSA  Continue [place] nightly  LOS: 1 days A FACE TO FACE EVALUATION WAS PERFORMED  Angel [name] Angel [name] 03/02/2021, 9:44 AM

## 2021-03-03 DIAGNOSIS — Z9981 Dependence on supplemental oxygen: Secondary | ICD-10-CM | POA: Diagnosis not present

## 2021-03-03 DIAGNOSIS — S069X0D Unspecified intracranial injury without loss of consciousness, subsequent encounter: Secondary | ICD-10-CM | POA: Diagnosis not present

## 2021-03-03 DIAGNOSIS — I2699 Other pulmonary embolism without acute cor pulmonale: Secondary | ICD-10-CM | POA: Diagnosis not present

## 2021-03-03 DIAGNOSIS — Z794 Long term (current) use of insulin: Secondary | ICD-10-CM

## 2021-03-03 DIAGNOSIS — R7309 Other abnormal glucose: Secondary | ICD-10-CM | POA: Diagnosis not present

## 2021-03-03 DIAGNOSIS — E1165 Type 2 diabetes mellitus with hyperglycemia: Secondary | ICD-10-CM

## 2021-03-03 LAB — GLUCOSE, CAPILLARY
Glucose-Capillary: 97 mg/dL (ref 70–99)
Glucose-Capillary: 175 mg/dL — ABNORMAL HIGH (ref 70–99)
Glucose-Capillary: 205 mg/dL — ABNORMAL HIGH (ref 70–99)
Glucose-Capillary: 190 mg/dL — ABNORMAL HIGH (ref 70–99)

## 2021-03-03 MED ORDER — GUAIFENESIN 100 MG/5ML PO SOLN: 15.0000 mL | Freq: Four times a day (QID) | ORAL | Status: DC | PRN

## 2021-03-03 NOTE — Plan of Care (Signed)
  Problem: RH Balance Goal: LTG Patient will maintain dynamic sitting balance (PT) Description: LTG:  Patient will maintain dynamic sitting balance with assistance during mobility activities (PT) Flowsheets (Taken 03/02/2021 1256) LTG: Pt will maintain dynamic sitting balance during mobility activities with:: Independent with assistive device  Goal: LTG Patient will maintain dynamic standing balance (PT) Description: LTG:  Patient will maintain dynamic standing balance with assistance during mobility activities (PT) Flowsheets (Taken 03/02/2021 1256) LTG: Pt will maintain dynamic standing balance during mobility activities with:: Contact Guard/Touching assist   Problem: Sit to Stand Goal: LTG:  Patient will perform sit to stand with assistance level (PT) Description: LTG:  Patient will perform sit to stand with assistance level (PT) Flowsheets (Taken 03/02/2021 1256) LTG: PT will perform sit to stand in preparation for functional mobility with assistance level: Supervision/Verbal cueing   Problem: RH Bed Mobility Goal: LTG Patient will perform bed mobility with assist (PT) Description: LTG: Patient will perform bed mobility with assistance, with/without cues (PT). Flowsheets (Taken 03/02/2021 1256) LTG: Pt will perform bed mobility with assistance level of: Independent with assistive device    Problem: RH Bed to Chair Transfers Goal: LTG Patient will perform bed/chair transfers w/assist (PT) Description: LTG: Patient will perform bed to chair transfers with assistance (PT). Flowsheets (Taken 03/02/2021 1256) LTG: Pt will perform Bed to Chair Transfers with assistance level: Contact Guard/Touching assist   Problem: RH Car Transfers Goal: LTG Patient will perform car transfers with assist (PT) Description: LTG: Patient will perform car transfers with assistance (PT). Flowsheets (Taken 03/02/2021 1256) LTG: Pt will perform car transfers with assist:: Contact Guard/Touching assist    Problem: RH Furniture Transfers Goal: LTG Patient will perform furniture transfers w/assist (OT/PT) Description: LTG: Patient will perform furniture transfers  with assistance (OT/PT). Flowsheets (Taken 03/02/2021 1256) LTG: Pt will perform furniture transfers with assist:: Contact Guard/Touching assist   Problem: RH Ambulation Goal: LTG Patient will ambulate in controlled environment (PT) Description: LTG: Patient will ambulate in a controlled environment, # of feet with assistance (PT). Flowsheets (Taken 03/02/2021 1256) LTG: Pt will ambulate in controlled environ  assist needed:: Contact Guard/Touching assist LTG: Ambulation distance in controlled environment: 100 ft using LRAD Goal: LTG Patient will ambulate in home environment (PT) Description: LTG: Patient will ambulate in home environment, # of feet with assistance (PT). Flowsheets (Taken 03/02/2021 1256) LTG: Pt will ambulate in home environ  assist needed:: Contact Guard/Touching assist LTG: Ambulation distance in home environment: at least 50 ft with LRAD   Problem: RH Stairs Goal: LTG Patient will ambulate up and down stairs w/assist (PT) Description: LTG: Patient will ambulate up and down # of stairs with assistance (PT) Flowsheets (Taken 03/02/2021 1256) LTG: Pt will ambulate up/down stairs assist needed:: Contact Guard/Touching assist LTG: Pt will  ambulate up and down number of stairs: at least 4 steps using BHR

## 2021-03-03 NOTE — Progress Notes (Signed)
[place] PHYSICAL MEDICINE & REHABILITATION PROGRESS NOTE  Subjective/Complaints: Patient seen laying in bed this morning.  He states he slept very well overnight.  Family at bedside requesting the ground pass for patient to see grandkids.  Patient states a good first day of therapies yesterday.  ROS: Denies CP, SOB, N/V/D  Objective: Vital Signs: Blood pressure 123/65, pulse 67, temperature 98.2 F (36.8 C), temperature source Oral, resp. rate 16, height 5\' 8"  (1.727 m), weight 113.9 kg, SpO2 100 %. No results found. No results for input(s): WBC, HGB, HCT, PLT in the last 72 hours.  No results for input(s): NA, K, CL, CO2, GLUCOSE, BUN, CREATININE, CALCIUM in the last 72 hours.   Intake/Output Summary (Last 24 hours) at 03/03/2021 0830 Last data filed at 03/02/2021 1816 Gross per 24 hour  Intake 555 ml  Output --  Net 555 ml         Physical Exam: BP 123/65 (BP Location: Right Arm)   Pulse 67   Temp 98.2 F (36.8 C) (Oral)   Resp 16   Ht 5\' 8"  (1.727 m)   Wt 113.9 kg   SpO2 100%   BMI 38.16 kg/m  Constitutional: No distress . Vital signs reviewed.  Obese. HENT: Normocephalic.  Atraumatic.  + Stoma. Eyes: EOMI. No discharge. Cardiovascular: No JVD.  RRR. Respiratory: Normal effort.  No stridor.  Bilateral clear to auscultation.  + [place]. GI: Non-distended.  BS +. Skin: Warm and dry.  Intact. Psych: Normal mood.  Normal behavior. Musc: No edema in extremities.  No tenderness in extremities. Neuro: Alert Wife is at bedside.   Makes eye contact with examiner.   Provides his name and age.   Follows simple commands.   Motor: 4 --4/5 throughout, unchanged  Assessment/Plan: 1. Functional deficits which require 3+ hours per day of interdisciplinary therapy in a comprehensive inpatient rehab setting. Physiatrist is providing close team supervision and 24 hour management of active medical problems listed below. Physiatrist and rehab team continue to assess barriers to  discharge/monitor patient progress toward functional and medical goals   Care Tool:  Bathing    Body parts bathed by patient: Right arm, Chest, Abdomen, Front perineal area, Right upper leg, Left upper leg, Face   Body parts bathed by helper: Left arm, Buttocks, Right lower leg, Left lower leg     Bathing assist Assist Level: Moderate Assistance - Patient 50 - 74%     Upper Body Dressing/Undressing Upper body dressing   What is the patient wearing?: Pull over shirt    Upper body assist Assist Level: Maximal Assistance - Patient 25 - 49%    Lower Body Dressing/Undressing Lower body dressing      What is the patient wearing?: Incontinence brief, Pants     Lower body assist Assist for lower body dressing: Total Assistance - Patient < 25%     Toileting Toileting    Toileting assist Assist for toileting: Total Assistance - Patient < 25%     Transfers Chair/bed transfer  Transfers assist     Chair/bed transfer assist level: Moderate Assistance - Patient 50 - 74%     Locomotion Ambulation   Ambulation assist      Assist level: Moderate Assistance - Patient 50 - 74% Assistive device: Walker-rolling Max distance: 3 ft   Walk 10 feet activity   Assist  Walk 10 feet activity did not occur: Safety/medical concerns        Walk 50 feet activity   Assist Walk  50 feet with 2 turns activity did not occur: Safety/medical concerns         Walk 150 feet activity   Assist Walk 150 feet activity did not occur: Safety/medical concerns         Walk 10 feet on uneven surface  activity   Assist Walk 10 feet on uneven surfaces activity did not occur: Safety/medical concerns         Wheelchair     Assist Is the patient using a wheelchair?: No Type of Wheelchair: Manual    Wheelchair assist level: Total Assistance - Patient < 25% Max wheelchair distance: >200 ft    Wheelchair 50 feet with 2 turns activity    Assist        Assist  Level: Total Assistance - Patient < 25%   Wheelchair 150 feet activity     Assist      Assist Level: Total Assistance - Patient < 25%   Medical Problem List and Plan: 1.  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  Antithrombotics: -DVT/anticoagulation:  Pharmaceutical: Other (comment)/Eliquis for pulmonary emboli             -antiplatelet therapy: N/A 3. Pain Management: Neurontin 300 mg twice daily, Robaxin 1000 mg every 8 hours, oxycodone as needed             Monitor for headaches with increased exertion  Controlled with meds on 10/16 4. Mood: Xanax 0.25 mg twice daily as needed             -antipsychotic agents: Seroquel 25 mg nightly 5. Neuropsych: This patient is not capable of making decisions on his own behalf. 6. Skin/Wound Care: Routine skin checks 7. Fluids/Electrolytes/Nutrition: Routine in and outs             CMP ordered for tomorrow 8.  VDRF.  Status post tracheostomy tube 01/14/2021.  Decannulated 02/26/2021  9.  Atrial fibrillation with RVR.  Follow-up cardiology services.  Amiodarone 200 mg daily, Lopressor 12.5 mg twice daily.               Monitor with increased activity  Controlled on 10/16 10.  Diabetes mellitus with hyperglycemia.  Currently on Semglee 16 units twice daily.  Check blood sugars before meals and at bedtime.  Patient on Glucotrol 2.5 mg daily, Tradjenta 5 mg daily, Glucophage 1000 mg twice daily, Actos 45 mg daily prior to admission.  Resume oral agents as possible  Labile on 10/16, monitor for trend             Monitor increase mobility 11.  Acute blood loss anemia.  Continue iron supplement.               Hemoglobin 8.4 on 10/13 CBC ordered for tomorrow 12.  Obesity.  BMI 32.74.  Dietary follow-up 13.  Slow transit constipation.  .  Bowel meds increased on 10/15  Improving 14.?  OSA  Continue [place]  nightly  LOS: 2 days A FACE TO FACE EVALUATION WAS PERFORMED  [place] Esch Lorie Phenix 03/03/2021, 8:30 AM

## 2021-03-03 NOTE — Progress Notes (Signed)
Pt assisted to wheelchair, exited unit on grounds pass. Full linen change by nurse

## 2021-03-04 DIAGNOSIS — S069X0S Unspecified intracranial injury without loss of consciousness, sequela: Secondary | ICD-10-CM | POA: Diagnosis not present

## 2021-03-04 DIAGNOSIS — D62 Acute posthemorrhagic anemia: Secondary | ICD-10-CM | POA: Diagnosis not present

## 2021-03-04 DIAGNOSIS — I2699 Other pulmonary embolism without acute cor pulmonale: Secondary | ICD-10-CM | POA: Diagnosis not present

## 2021-03-04 LAB — CBC WITH DIFFERENTIAL/PLATELET
Abs Immature Granulocytes: 0.02 10*3/uL (ref 0.00–0.07)
Basophils Absolute: 0 10*3/uL (ref 0.0–0.1)
Basophils Relative: 1 %
Eosinophils Absolute: 0.2 10*3/uL (ref 0.0–0.5)
Eosinophils Relative: 4 %
HCT: 27.1 % — ABNORMAL LOW (ref 39.0–52.0)
Hemoglobin: 8.3 g/dL — ABNORMAL LOW (ref 13.0–17.0)
Immature Granulocytes: 1 %
Lymphocytes Relative: 29 %
Lymphs Abs: 1.3 10*3/uL (ref 0.7–4.0)
MCH: 28.2 pg (ref 26.0–34.0)
MCHC: 30.6 g/dL (ref 30.0–36.0)
MCV: 92.2 fL (ref 80.0–100.0)
Monocytes Absolute: 0.6 10*3/uL (ref 0.1–1.0)
Monocytes Relative: 13 %
Neutro Abs: 2.3 10*3/uL (ref 1.7–7.7)
Neutrophils Relative %: 52 %
Platelets: 261 10*3/uL (ref 150–400)
RBC: 2.94 MIL/uL — ABNORMAL LOW (ref 4.22–5.81)
RDW: 18.1 % — ABNORMAL HIGH (ref 11.5–15.5)
WBC: 4.4 10*3/uL (ref 4.0–10.5)
nRBC: 0 % (ref 0.0–0.2)

## 2021-03-04 LAB — COMPREHENSIVE METABOLIC PANEL
ALT: 18 U/L (ref 0–44)
AST: 15 U/L (ref 15–41)
Albumin: 2.3 g/dL — ABNORMAL LOW (ref 3.5–5.0)
Alkaline Phosphatase: 89 U/L (ref 38–126)
Anion gap: 7 (ref 5–15)
BUN: 18 mg/dL (ref 8–23)
CO2: 26 mmol/L (ref 22–32)
Calcium: 9.1 mg/dL (ref 8.9–10.3)
Chloride: 105 mmol/L (ref 98–111)
Creatinine, Ser: 1.1 mg/dL (ref 0.61–1.24)
GFR, Estimated: >60 mL/min (ref >60)
Glucose, Bld: 105 mg/dL — ABNORMAL HIGH (ref 70–99)
Potassium: 3.6 mmol/L (ref 3.5–5.1)
Sodium: 138 mmol/L (ref 135–145)
Total Bilirubin: 0.5 mg/dL (ref 0.3–1.2)
Total Protein: 5.5 g/dL — ABNORMAL LOW (ref 6.5–8.1)

## 2021-03-04 LAB — GLUCOSE, CAPILLARY
Glucose-Capillary: 104 mg/dL — ABNORMAL HIGH (ref 70–99)
Glucose-Capillary: 191 mg/dL — ABNORMAL HIGH (ref 70–99)
Glucose-Capillary: 206 mg/dL — ABNORMAL HIGH (ref 70–99)
Glucose-Capillary: 207 mg/dL — ABNORMAL HIGH (ref 70–99)

## 2021-03-04 MED ORDER — PROSOURCE PLUS PO LIQD
30.0000 mL | Freq: Two times a day (BID) | ORAL | Status: DC
  Administered 2021-03-04 – 2021-03-14 (×21): 30 mL via ORAL
  Filled 2021-03-04 (×19): qty 30

## 2021-03-04 MED ORDER — CIPROFLOXACIN-DEXAMETHASONE 0.3-0.1 % OT SUSP
4.0000 [drp] | Freq: Two times a day (BID) | OTIC | Status: AC
  Administered 2021-03-04 – 2021-03-10 (×11): 4 [drp] via OTIC
  Filled 2021-03-04: qty 7.5

## 2021-03-04 MED ORDER — ENSURE MAX PROTEIN PO LIQD: 11.0000 [oz_av] | Freq: Every day | ORAL | Status: DC

## 2021-03-04 NOTE — Progress Notes (Addendum)
Angel [name] PHYSICAL MEDICINE & REHABILITATION PROGRESS NOTE  Subjective/Complaints:   No issues overnite , discussed low Hgb, , pt asking if right ear canal can be cleaned out, hx of middle ear hemorrhage associated with occipital and temporal fx   ROS: Denies CP, SOB, N/V/D  Objective: Vital Signs: Blood pressure 119/70, pulse 63, temperature 97.9 F (36.6 C), temperature source Oral, resp. rate 18, height 5\' 8"  (1.727 m), weight 113.9 kg, SpO2 99 %. No results found. Recent Labs    03/04/21 0529  WBC 4.4  HGB 8.3*  HCT 27.1*  PLT 261    Recent Labs    03/04/21 0529  NA 138  K 3.6  CL 105  CO2 26  GLUCOSE 105*  BUN 18  CREATININE 1.10  CALCIUM 9.1     Intake/Output Summary (Last 24 hours) at 03/04/2021 1035 Last data filed at 03/04/2021 0437 Gross per 24 hour  Intake 320 ml  Output 800 ml  Net -480 ml         Physical Exam: BP 119/70 (BP Location: Right Arm)   Pulse 63   Temp 97.9 F (36.6 C) (Oral)   Resp 18   Ht 5\' 8"  (1.727 m)   Wt 113.9 kg   SpO2 99%   BMI 38.16 kg/m  Constitutional: No distress . Vital signs reviewed.  Obese. HENT: Normocephalic.  Atraumatic.  + Stoma.  General: No acute distress Mood and affect are appropriate Heart: Regular rate and rhythm no rubs murmurs or extra sounds Lungs: Clear to auscultation, breathing unlabored, no rales or wheezes Abdomen: Positive bowel sounds, soft nontender to palpation, nondistended Extremities: No clubbing, cyanosis, or edema Skin: No evidence of breakdown, no evidence of rash   Neuro: Alert Wife is at bedside.   Makes eye contact with examiner.   Provides his name and age.   Follows simple commands.   Motor: 5/5 in BUE , 4/5 in BLE   Assessment/Plan: 1. Functional deficits which require 3+ hours per day of interdisciplinary therapy in a comprehensive inpatient rehab setting. Physiatrist is providing close team supervision and 24 hour management of active medical problems listed  below. Physiatrist and rehab team continue to assess barriers to discharge/monitor patient progress toward functional and medical goals   Care Tool:  Bathing    Body parts bathed by patient: Right arm, Chest, Abdomen, Front perineal area, Right upper leg, Left upper leg, Face   Body parts bathed by helper: Left arm, Buttocks, Right lower leg, Left lower leg     Bathing assist Assist Level: Moderate Assistance - Patient 50 - 74%     Upper Body Dressing/Undressing Upper body dressing   What is the patient wearing?: Pull over shirt    Upper body assist Assist Level: Maximal Assistance - Patient 25 - 49%    Lower Body Dressing/Undressing Lower body dressing      What is the patient wearing?: Incontinence brief     Lower body assist Assist for lower body dressing: Total Assistance - Patient < 25%     Toileting Toileting    Toileting assist Assist for toileting: Total Assistance - Patient < 25%     Transfers Chair/bed transfer  Transfers assist     Chair/bed transfer assist level: Moderate Assistance - Patient 50 - 74%     Locomotion Ambulation   Ambulation assist      Assist level: Moderate Assistance - Patient 50 - 74% Assistive device: Walker-rolling Max distance: 3 ft   Walk 10 feet  activity   Assist  Walk 10 feet activity did not occur: Safety/medical concerns        Walk 50 feet activity   Assist Walk 50 feet with 2 turns activity did not occur: Safety/medical concerns         Walk 150 feet activity   Assist Walk 150 feet activity did not occur: Safety/medical concerns         Walk 10 feet on uneven surface  activity   Assist Walk 10 feet on uneven surfaces activity did not occur: Safety/medical concerns         Wheelchair     Assist Is the patient using a wheelchair?: Yes Type of Wheelchair: Manual    Wheelchair assist level: Total Assistance - Patient < 25% Max wheelchair distance: >200 ft    Wheelchair 50  feet with 2 turns activity    Assist        Assist Level: Total Assistance - Patient < 25%   Wheelchair 150 feet activity     Assist      Assist Level: Total Assistance - Patient < 25%   Medical Problem List and Plan: 1.  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  Antithrombotics: -DVT/anticoagulation:  Pharmaceutical: Other (comment)/Eliquis for pulmonary emboli             -antiplatelet therapy: N/A 3. Pain Management: Neurontin 300 mg twice daily, Robaxin 1000 mg every 8 hours, oxycodone as needed             Monitor for headaches with increased exertion  Controlled with meds on 10/16 4. Mood: Xanax 0.25 mg twice daily as needed             -antipsychotic agents: Seroquel 25 mg nightly 5. Neuropsych: This patient is not capable of making decisions on his own behalf. 6. Skin/Wound Care: Routine skin checks 7. Fluids/Electrolytes/Nutrition: Routine in and outs             CMP ordered for tomorrow 8.  VDRF.  Status post tracheostomy tube 01/14/2021.  Decannulated 02/26/2021  9.  Atrial fibrillation with RVR.  Follow-up cardiology services.  Amiodarone 200 mg daily, Lopressor 12.5 mg twice daily.               Monitor with increased activity  Controlled on 10/16 10.  Diabetes mellitus with hyperglycemia.  Currently on Semglee 16 units twice daily.  Check blood sugars before meals and at bedtime.  Patient on Glucotrol 2.5 mg daily, Tradjenta 5 mg daily, Glucophage 1000 mg twice daily, Actos 45 mg daily prior to admission.  Resume oral agents as possible   Vitals:   03/03/21 1926 03/04/21 0440  BP: 117/60 119/70  Pulse: 63 63  Resp: 18 18  Temp: 98.6 F (37 C) 97.9 F (36.6 C)  SpO2: 97% 99%                Monitor increase mobility 11.  Acute blood loss anemia.  Continue iron supplement.               Hemoglobin 8.4 on 10/13, 8.3 on 10/17, likely decreased  manufacture , but will check stool for OB, hx hemorrhoids only one episode of overt blood in stool   12.  Obesity.  BMI 32.74.  Dietary follow-up 13.  Slow transit constipation.  .  Bowel meds increased on 10/15  Improving 14.?  OSA  Continue [place] nightly 15.  Hx of right middle ear hemorrhage, temporal and occipital fx will ask ENT if ok to use debrox to clean ear canal , pt c/o excess wax  LOS: 3 days A FACE TO FACE EVALUATION WAS PERFORMED  Charlett Blake 03/04/2021, 10:35 AM

## 2021-03-04 NOTE — IPOC Note (Signed)
Overall Plan of Care [facility]) Patient Details Name: Angel [name] MRN: [id number] DOB: 1951/01/24  Admitting Diagnosis: TBI (traumatic brain injury)  Hospital Problems: Principal Problem:   TBI (traumatic brain injury) Active Problems:   Supplemental oxygen dependent   Labile blood glucose   Acute pulmonary embolism without acute cor pulmonale (HCC)   Controlled type 2 diabetes mellitus with hyperglycemia, with long-term current use of insulin (HCC)     Functional Problem List: Nursing Pain, Bowel, Endurance, Medication Management, Safety, Skin Integrity  PT Balance, Behavior, Edema, Endurance, Motor, Nutrition, Pain, Perception, Safety, Sensory, Skin Integrity  OT Balance, Cognition, Endurance, Motor, Pain, Safety, Sensory, Vision  SLP Cognition, Safety  TR         Basic ADL's: OT Bathing, Grooming, Dressing, Toileting     Advanced  ADL's: OT       Transfers: PT Bed Mobility, Bed to Chair, Car, Manufacturing systems engineer, Metallurgist: PT Ambulation, Emergency planning/management officer, Stairs     Additional Impairments: OT Fuctional Use of Upper Extremity  SLP Social Cognition   Problem Solving, Memory, Attention  TR      Anticipated Outcomes Item Anticipated Outcome  Self Feeding S  Swallowing      Basic self-care  S  Toileting  S   Bathroom Transfers S  Bowel/Bladder  manage bowel w mod I assist  Transfers  Close supervision/ CGA  Locomotion  CGA with LRAD  Communication     Cognition  Supervision A  Pain  at or below level 4  Safety/Judgment  maintain safety w cues   Therapy Plan: PT Intensity: Minimum of 1-2 x/day ,45 to 90 minutes PT Frequency: 5 out of 7 days PT Duration Estimated Length of Stay: 11-14 days OT Intensity: Minimum of 1-2 x/day, 45 to 90 minutes OT Frequency: 5 out of 7 days OT Duration/Estimated Length of Stay: 2-2.5 weeks SLP Intensity: Minumum of 1-2 x/day, 30 to 90 minutes SLP Frequency: 3 to 5 out of 7 days SLP  Duration/Estimated Length of Stay: 2 weeks   Due to the current state of emergency, patients may not be receiving their 3-hours of Medicare-mandated therapy.   Team Interventions: Nursing Interventions Patient/Family Education, Disease Management/Prevention, Discharge Planning, Skin Care/Wound Management, Pain Management, Cognitive Remediation/Compensation, Medication Management, Bowel Management  PT interventions Ambulation/gait training, Balance/vestibular training, Cognitive remediation/compensation, Community reintegration, Disease management/prevention, Discharge planning, DME/adaptive equipment instruction, Functional electrical stimulation, Functional mobility training, Neuromuscular re-education, Pain management, Patient/family education, Psychosocial support, Stair training, UE/LE Strength taining/ROM, Wheelchair propulsion/positioning, UE/LE Coordination activities, Therapeutic Activities, Skin care/wound management, Splinting/orthotics, Therapeutic Exercise, Visual/perceptual remediation/compensation  OT Interventions Balance/vestibular training, Discharge planning, Pain management, Self Care/advanced ADL retraining, Therapeutic Activities, UE/LE Coordination activities, Visual/perceptual remediation/compensation, Therapeutic Exercise, Skin care/wound managment, Patient/family education, Functional mobility training, Disease mangement/prevention, Cognitive remediation/compensation, Community reintegration, Engineer, drilling, Neuromuscular re-education, Psychosocial support, Splinting/orthotics, UE/LE Strength taining/ROM, Wheelchair propulsion/positioning  SLP Interventions Cognitive remediation/compensation, Internal/external aids, Therapeutic Activities, Therapeutic Exercise, Patient/family education, Functional tasks, Cueing hierarchy  TR Interventions    SW/CM Interventions     Barriers to Discharge MD  Medical stability and Weight  Nursing Home environment  access/layout, Decreased caregiver support, Wound Care 1 level, level entry w wife  PT Inaccessible home environment, Home environment access/layout, Incontinence, Wound Care, Lack of/limited family support, Insurance for SNF coverage, Weight, Nutrition means, Decreased caregiver support    OT Home environment access/layout, Inaccessible home environment, Weight    SLP  (none for speech)    SW  Team Discharge Planning: Destination: PT-Home ,OT- Home , SLP-Home Projected Follow-up: PT-Home health PT, Outpatient PT, OT-  Home health OT, SLP-None Projected Equipment Needs: PT-To be determined, OT- Tub/shower seat, To be determined, 3 in 1 bedside comode, SLP-None recommended by SLP Equipment Details: PT- , OT-  Patient/family involved in discharge planning: PT- Patient, Family member/caregiver,  OT-Patient, SLP-Patient  MD ELOS: 7-10d Medical Rehab Prognosis:  Good Assessment:  70 year old right-handed male with history of diabetes mellitus, hyperlipidemia and hypertension.  History taken from chart review and patient.  Patient lives with spouse.  1 level home with level entry.  Independent prior to admission.  He presented on 12/28/2020 after a fall down approximately 8 steps while going down to his basement with +LOC.  Patient reportedly agitated in route confused with emesis x2.  He did have blood coming from his right ear.  He required intubation for airway protection.  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  Additional extra-axial hemorrhage over the right convexity.  Areas of subarachnoid hemorrhage in the anterior frontal lobes bilaterally with blood along the falx likely subdural.  Minimally displaced right occipital skull fracture.  A longitudinal right temporal bone fracture extends through the right middle ear cavity.  CT cervical spine negative.  CT of chest abdomen pelvis showed no evidence of acute  trauma.  There was an incidental finding of 2.9 cm heterogeneous right thyroid nodule present recommend thyroid ultrasound as outpatient.  Admission chemistries unremarkable except potassium 3.3, glucose 166, alcohol negative, hemoglobin 10.8, lactic acid 4.0.  Neurosurgery follow-up for [facility] Dr. Annette Stable recommended conservative care.  Maintained on Keppra x7 days for seizure prophylaxis.  ENT Dr. Constance Holster in regards to temporal bone fractures again advised conservative care.  Hospital course cardiology services consulted 01/07/2021 for evaluation of atrial fibrillation with RVR and started on amiodarone with bolus load.  Echocardiogram with ejection fraction of 60 to 65%, no wall motion abnormalities.  CT angiogram of the chest showed bilateral pulmonary emboli most significantly affecting the right lower lobe pulmonary artery with associated reduced perfusion of the posterior medial basilar segments of the right lower lobe.  Vascular ultrasound left upper extremity findings consistent with age-indeterminate superficial vein thrombosis involving the left cephalic vein and left basilic vein on 11/28/4578.  Patient underwent placement of tracheostomy 01/14/2021 per Dr.Lovick.  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  His tracheostomy tube was downsized to a #6 cuffless 02/05/2021 and to a #4 cuffless 02/20/2021 and decannulated 02/26/2021.  Hospital course complicated by AKI with nephrology services consulted 01/13/2021 with creatinine 4.5-4.96 felt most likely secondary to hypotensive episode suspect hospital course sepsis and IV contrast.  He did require short course of hemodialysis and tunneled catheter had been placed with latest hemodialysis 02/08/2021 and AKI resolved with latest creatinine 1.06 and  tunneled catheter has since been removed.  Initially with nasogastric tube feeds for nutritional support and his diet has been advanced to a regular consistency.  Hospital course acute blood loss anemia transfuse 1 unit packed red blood cells 02/04/2021 with latest hemoglobin 8.4.  Palliative care has been consulted to establish goals of care.  Therapy evaluations completed due to patient's TBI/SAH was admitted for a comprehensive rehab program  See Team Conference Notes for weekly updates to the plan of care

## 2021-03-04 NOTE — Progress Notes (Signed)
Inpatient Rehabilitation  Patient information reviewed and entered into eRehab system by Sathvika Ojo Lucia Mccreadie, OTR/L.   Information including medical coding, functional ability and quality indicators will be reviewed and updated through discharge.    

## 2021-03-04 NOTE — Progress Notes (Signed)
Physical Therapy Session Note  Patient Details  Name: Angel [name] MRN: [id number] Date of Birth: [birth year]  Today's Date: 03/04/2021 PT Individual Time: 1300-1415 PT Individual Time Calculation (min): 75 min   Short Term Goals: Week 1:  PT Short Term Goal 1 (Week 1): Pt will demonstrate improved standing balance requiring up to CGA/ MinA to maintain with RW for at least 3 min. PT Short Term Goal 2 (Week 1): Pt will perform sit <> stands with MinA to RW. PT Short Term Goal 3 (Week 1): Pt will perform stand pivot transfers using RW with MinA for posterior bias. PT Short Term Goal 4 (Week 1): Pt will ambulate at least 30 ft using RW with MinA. Week 2:    Week 3:     Skilled Therapeutic Interventions/Progress Updates:    Pt initially supine w/family at bedside.  Pt dons pants w/overall mod assist, rolls and bridges to accomplish raising pants.  Supine to sit w/min assist and use of rail.  Sit to stand w/mod assist from elevated bed, unable from lowest setting.  stand pivot transfer to wc w/RW and min assist, cues for sequencing. Pt transported to gym, family oriented to rehab gym then exits.  Sit to stand repeated from wc w/mod assist to RW overall 5x  Rests full min between efforts. BP 127/84, HR 68, 02 97%  Gait 27ft w/RW, min assist, cues for upright posture, close wc follow for safety.  Pt w/wobbles bilat knees, increased hip flexion w/mild anterior bias BP 143/71, HR 67, 02 100%  Standing in parallel bars balance and general strengthening/ROM.  Alternating arm raises, alternating min cross body reach, static stand no UE support w/mild post tendency, Alternating shoulder abd, shoulder shrugs, scapular retraction, and minisquats.  Pt performs approx 1 min therex standing before resting in sitting.  Repeated standing/various above therex 5X.    Pt expressed desire for short LOS, eager to get home.  Discussed need for improved ability w/Sit to stand and gait stability prior to DC, but  do not anticipate long stay needed to accomplish if improvement continues steadily without complications.    Therapy Documentation Precautions:  Precautions Precautions: Fall Precaution Comments: watch BP, mild dizziness in standing, posterior bias in standing Restrictions Weight Bearing Restrictions: No     Therapy/Group: Individual Therapy Callie Fielding, [place] 03/04/2021, 3:26 PM

## 2021-03-04 NOTE — Care Management (Signed)
[facility] Individual Statement of Services  Patient Name:  Angel [name]  Date:  03/04/2021  Welcome to the [facility].  Our goal is to provide you with an individualized program based on your diagnosis and situation, designed to meet your specific needs.  With this comprehensive rehabilitation program, you will be expected to participate in at least 3 hours of rehabilitation therapies Monday-Friday, with modified therapy programming on the weekends.  Your rehabilitation program will include the following services:  Physical Therapy (PT), Occupational Therapy (OT), Speech Therapy (ST), 24 hour per day rehabilitation nursing, Therapeutic Recreaction (TR), Psychology, Neuropsychology, Care Coordinator, Rehabilitation Medicine, [facility], and Other  Weekly team conferences will be held on Tuesdays to discuss your progress.  Your Inpatient Rehabilitation Care Coordinator will talk with you frequently to get your input and to update you on team discussions.  Team conferences with you and your family in attendance may also be held.  Expected length of stay: 11-14 days    Overall anticipated outcome: Independent with an Assistive Device  Depending on your progress and recovery, your program may change. Your Inpatient Rehabilitation Care Coordinator will coordinate services and will keep you informed of any changes. Your Inpatient Rehabilitation Care Coordinator's name and contact numbers are listed  below.  The following services may also be recommended but are not provided by the [facility] will be made to provide these services after discharge if needed.  Arrangements include referral to agencies that provide these services.  Your insurance has been verified to be:   [employer]  Your primary doctor is:  [employer]  Pertinent information will be shared with your doctor and your insurance company.  Inpatient Rehabilitation Care Coordinator:  Cathleen Corti [phone] or [phone]  Information discussed with and copy given to patient by: Rana Snare, 03/04/2021, 8:46 AM

## 2021-03-04 NOTE — Progress Notes (Signed)
Occupational Therapy Session Note  Patient Details  Name: Angel [name] MRN: [id number] Date of Birth: [birth year]  Today's Date: 03/04/2021 OT Individual Time: 2774-1287 OT Individual Time Calculation (min): 44 min    Short Term Goals: Week 1:  OT Short Term Goal 1 (Week 1): Pt will consistently transfer with MIN A to toilet OT Short Term Goal 2 (Week 1): Pt will thread BLE into pants with AE PRN OT Short Term Goal 3 (Week 1): Pt will groom wiht set up OT Short Term Goal 4 (Week 1): Pt will don shirt wiht MIN A  Skilled Therapeutic Interventions/Progress Updates:    Patient seated in w/c, alert, denies pain at this time.  Wife and son present for session.  He notes fatigue but would like to participate.  He agrees to shower when offered.  Attempted sit to stand from w/c but unable at this time.  Utilized stedy for standing and transport to/from w/c and shower bench.  Sit to stand from w/c to stedy max A first attempt, min/mod A on following attempts.  Able to maintain standing for pants down, pants up.  Shower completed with mod A to reach lower legs, buttocks and thoroughness with arms.  Rest breaks needed at times due to fatigue.   Dressing completed seated on shower bench, cues for sequencing.   Max A/dep for lower body dressing and slipper socks, mod A for OH shirt.   Dependent for clothing management while in stance on stedy surface.  To w/c via stedy, dependent for combing hair due to fatigue.   He opted to remain seated in w/c at close of session, missing last 15 minutes due to fatigue, seat belt alarm set and call bell in hand.    Therapy Documentation Precautions:  Precautions Precautions: Fall Precaution Comments: watch BP, mild dizziness in standing, posterior bias in standing Restrictions Weight Bearing Restrictions: No   Therapy/Group: Individual Therapy  [name] 03/04/2021, 7:54 AM

## 2021-03-04 NOTE — Progress Notes (Addendum)
Speech Language Pathology TBI Note  Patient Details  Name: Angel [name] MRN: [id number] Date of Birth: 12-11-1950  Today's Date: 03/04/2021 SLP Individual Time: 1110-1205 SLP Individual Time Calculation (min): 55 min  Short Term Goals: Week 1: SLP Short Term Goal 1 (Week 1): Pt will participate in further cognitive assessment (CLQT, AFLA, etc.) to further direct POC SLP Short Term Goal 2 (Week 1): Pt will increase recall of novel information with 90% accuracy provided min A SLP Short Term Goal 3 (Week 1): Pt will perform alternating attention tasks provided mod A for use of strategies as trained SLP Short Term Goal 4 (Week 1): Pt will complete mildly complex problem solving tasks with 90% accuracy provided min A SLP Short Term Goal 5 (Week 1): Pt will ID 3 cognitive and physical changes as a result of TBI and hospitalization. SLP Short Term Goal 6 (Week 1): Pt will complete med management task with 90% accuracy provided min A  Skilled Therapeutic Interventions: Pt received semi-reclined in bed and agreeable to skilled ST intervention with focus on cognitive goals. Per family request, SLP provided education on cognitive-communication deficits following TBI accompanied by handouts on right vs. left brain processes and lobes of the brain. Pt expressed he knew he hit his head but was not aware he had technically sustained an "injury" to his brain. SLP reviewed results of initial ST evaluation suggestive of mild cognitive impairment. Spouse expressed she has observed impulsiveness and voiced some concern for patient's safety awareness especially when family is not present to remind him he needs assistance. This was further supported during functional conversation in which pt displayed decreased emergent awareness. Pt expressed he would like to get back to managing his medications again and interested in addressing in future sessions. Patient was left in bed with alarm activated and immediate needs within  reach at end of session. Continue per current plan of care.      Pain Pain Assessment Pain Scale: 0-10 Pain Score: 0-No pain  Agitated Behavior Scale: TBI Observation Details Observation Environment: pt room Start of observation period - Date: 03/04/10 Start of observation period - Time: 1110 End of observation period - Date: 03/04/21 End of observation period - Time: 1205 Agitated Behavior Scale (DO NOT LEAVE BLANKS) Short attention span, easy distractibility, inability to concentrate: Absent Impulsive, impatient, low tolerance for pain or frustration: Absent Uncooperative, resistant to care, demanding: Absent Violent and/or threatening violence toward people or property: Absent Explosive and/or unpredictable anger: Absent Rocking, rubbing, moaning, or other self-stimulating behavior: Absent Pulling at tubes, restraints, etc.: Absent Wandering from treatment areas: Absent Restlessness, pacing, excessive movement: Absent Repetitive behaviors, motor, and/or verbal: Absent Rapid, loud, or excessive talking: Absent Sudden changes of mood: Absent Easily initiated or excessive crying and/or laughter: Absent Self-abusiveness, physical and/or verbal: Absent Agitated behavior scale total score: 14  Therapy/Group: Individual Therapy  Angel [name] 03/04/2021, 12:38 PM

## 2021-03-05 DIAGNOSIS — E1165 Type 2 diabetes mellitus with hyperglycemia: Secondary | ICD-10-CM | POA: Diagnosis not present

## 2021-03-05 DIAGNOSIS — S069X0S Unspecified intracranial injury without loss of consciousness, sequela: Secondary | ICD-10-CM | POA: Diagnosis not present

## 2021-03-05 DIAGNOSIS — I2699 Other pulmonary embolism without acute cor pulmonale: Secondary | ICD-10-CM | POA: Diagnosis not present

## 2021-03-05 DIAGNOSIS — R7309 Other abnormal glucose: Secondary | ICD-10-CM | POA: Diagnosis not present

## 2021-03-05 LAB — GLUCOSE, CAPILLARY
Glucose-Capillary: 108 mg/dL — ABNORMAL HIGH (ref 70–99)
Glucose-Capillary: 131 mg/dL — ABNORMAL HIGH (ref 70–99)
Glucose-Capillary: 256 mg/dL — ABNORMAL HIGH (ref 70–99)
Glucose-Capillary: 180 mg/dL — ABNORMAL HIGH (ref 70–99)

## 2021-03-05 NOTE — Progress Notes (Signed)
Occupational Therapy Session Note  Patient Details  Name: Angel [name] MRN: [id number] Date of Birth: 1951/04/21  Today's Date: 03/05/2021 OT Individual Time: 1300-1400 OT Individual Time Calculation (min): 60 min    Short Term Goals: Week 1:  OT Short Term Goal 1 (Week 1): Pt will consistently transfer with MIN A to toilet OT Short Term Goal 2 (Week 1): Pt will thread BLE into pants with AE PRN OT Short Term Goal 3 (Week 1): Pt will groom wiht set up OT Short Term Goal 4 (Week 1): Pt will don shirt wiht MIN A  Skilled Therapeutic Interventions/Progress Updates:    Patient in bed, ready for therapy and denies pain.  He declines need for adl tasks at this time.  Supine to sitting edge of bed with CS.  Dependent to donn sneakers.  Sit to stand and stand pivot transfer with RW to/from bed, w/c and mat table in gym with CGA/min A with exception of standing from w/c requiring repositioning and mod A to initiate (note elevated mat and bed surface)    Patient tolerated unsupported sitting on mat table for 40 minutes - he notes fatigue but pleased with improving endurance.   Educated patient on lower body dressing assistive devices to include sock aide, reacher, dressing stick, elastic laces and shoe funnel.  After initial instruction patient able to untie shoe, doff shoe/sock and donn sock/shoe with CS.   Completed trunk mobility, posture and proximal mobility/stability activities, bilateral shoulder and trunk stretch, arm conditioning activities with good tolerance.   Provided and reviewed theraputty exercises that he will carryover in his room.  He remained seated in w/c at close of session, seat belt alarm set and call bell/tray table in reach.     Therapy Documentation Precautions:  Precautions Precautions: Fall Precaution Comments: watch BP, mild dizziness in standing, posterior bias in standing Restrictions Weight Bearing Restrictions: No   Therapy/Group: Individual Therapy  [name] 03/05/2021, 7:43 AM

## 2021-03-05 NOTE — Patient Care Conference (Signed)
Inpatient RehabilitationTeam Conference and Plan of Care Update Date: 03/05/2021   Time: 10:11 AM    Patient Name: Angel [name]      Medical Record Number: [id number]  Date of Birth: 1950/07/14 Sex: Male         Room/Bed: 4W17C/4W17C-01 Payor Info: Payor: [employer] / Plan: [employer] PPO / Product Type: *No Product type* /    Admit Date/Time:  03/01/2021  3:24 PM  Primary Diagnosis:  TBI (traumatic brain injury)  Hospital Problems: Principal Problem:   TBI (traumatic brain injury) Active Problems:   Supplemental oxygen dependent   Labile blood glucose   Acute pulmonary embolism without acute cor pulmonale ([place])   Controlled type 2 diabetes mellitus with hyperglycemia, with long-term current use of insulin [facility])    Expected Discharge Date: Expected Discharge Date: 03/15/21  Team Members Present: Physician leading conference: Dr. Alger Simons Social Worker Present: Loralee Pacas, [place] Nurse Present: Dorthula Nettles, RN PT Present: Page Spiro, PT OT Present: Elisabeth Most, OT SLP Present: Weston Anna, SLP PPS Coordinator present : Gunnar Fusi, SLP     Current Status/Progress Goal Weekly Team Focus  Bowel/Bladder   Pt is incontinent  Pt will gain continence  Will assess qshift and PRN   Swallow/Nutrition/ Hydration             ADL's   UB bathing/dressing mod A, LB bathing/dressing max A/dep, sit to stand min to max A in stedy, fatigue limiting  min a  adl training, transfer training, general conditioning, sit to stand, upright tolerance, DME   Mobility   min/mod assist bed mobility, mod assist sit<>stand and stand pivot transfers using RW (variable with fatigue), min/mod assist gait up to 68ft using RW with +2 w/c follow, min/mod assist step up 1 3" height step using B HRs  CGA overall at ambulatory level using LRAD  B LE strengthening, transfer training, standing tolerance and balance, gait training, pt/family education, activity tolerance    Communication             Safety/Cognition/ Behavioral Observations  min A - mild deficits in short-term recall, higher level problem solving, attention, emergent awareness,  sup A  Problem solving, attention, short-term recall, awareness   Pain   Pt is currently pain free  Pt will remain pain free  Will asses qshift and PRN   Skin   Pt's skin is intact  Pt's skin will remain intact  Will assess qshift and PRN     Discharge Planning:  D/c to home with his wife who will provide 24/7 care. Son will take FMLA when pt discharges. Dtr to proivide PRN support.   Team Discussion: [place] down stairs in August, 2022. Had complications on Acute. Looking good at this standpoint. Trach discontinued, will do Silver Nitrate on stoma site. Continent bladder, incontinent bowel, LBM 10/16.  Patient on target to meet rehab goals: yes, mod/max with steady, dependent for care, lower body. Mod assist upper body. Has difficult time getting to a standing position. Posterior lean improved. Gait up to 70 ft. Working on stairs. Min/mod assist with SLP. Shows increased awareness.  *See Care Plan and progress notes for long and short-term goals.   Revisions to Treatment Plan:  MD applying Silver Nitrated to trach site stoma today.  Teaching Needs: Family education, medication management, pain management, skin/wound care, safety awareness.  Current Barriers to Discharge: Decreased caregiver support, Home enviroment access/layout, Incontinence, Wound care, Lack of/limited family support, and Weight  Possible Resolutions to  Barriers: Family education, son to take FMLA to assist when patient discharges.      Medical Summary Current Status: TBI with skull fx after fall down stairs, complicated by PE and respiratory failure, AKI. moving bowels and bladder  Barriers to Discharge: Medical stability   Possible Resolutions to [employer] Focus: daily assessment of labs and patient data, wound care   Continued  Need for Acute Rehabilitation Level of Care: The patient requires daily medical management by a physician with specialized training in physical medicine and rehabilitation for the following reasons: Direction of a multidisciplinary physical rehabilitation program to maximize functional independence : Yes Medical management of patient stability for increased activity during participation in an intensive rehabilitation regime.: Yes Analysis of laboratory values and/or radiology reports with any subsequent need for medication adjustment and/or medical intervention. : Yes   I attest that I was present, lead the team conference, and concur with the assessment and plan of the team.   Cristi Loron 03/05/2021, 12:31 PM

## 2021-03-05 NOTE — Progress Notes (Signed)
Speech Language Pathology TBI Note  Patient Details  Name: Angel [name] MRN: [id number] Date of Birth: 29-Oct-1950  Today's Date: 03/05/2021 SLP Individual Time: 0730-0830 SLP Individual Time Calculation (min): 60 min  Short Term Goals: Week 1: SLP Short Term Goal 1 (Week 1): Pt will participate in further cognitive assessment (CLQT, AFLA, etc.) to further direct POC SLP Short Term Goal 2 (Week 1): Pt will increase recall of novel information with 90% accuracy provided min A SLP Short Term Goal 3 (Week 1): Pt will perform alternating attention tasks provided mod A for use of strategies as trained SLP Short Term Goal 4 (Week 1): Pt will complete mildly complex problem solving tasks with 90% accuracy provided min A SLP Short Term Goal 5 (Week 1): Pt will ID 3 cognitive and physical changes as a result of TBI and hospitalization. SLP Short Term Goal 6 (Week 1): Pt will complete med management task with 90% accuracy provided min A  Skilled Therapeutic Interventions: Skilled treatment session focused on cognitive goals. SLP facilitated session by providing overall supervision level verbal cues during basic money and medication management tasks. Despite discomfort while laying in bed, patient maintained appropriate attention but eventually transferred to the wheelchair with +2 assist for safety. SLP also provided external aids to maximize recall to date. Use of call bell for assistance was reinforced for safety. Patient left upright in wheelchair with alarm on and all needs within reach. Continue with current plan of care.      Pain Discomfort while laying in bed, transferred to the wheelchair   Agitated Behavior Scale: TBI Observation Details Observation Environment: Patient's room Start of observation period - Date: 03/05/21 Start of observation period - Time: 0730 End of observation period - Date: 03/05/21 End of observation period - Time: 0830 Agitated Behavior Scale (DO NOT LEAVE  BLANKS) Short attention span, easy distractibility, inability to concentrate: Absent Impulsive, impatient, low tolerance for pain or frustration: Absent Uncooperative, resistant to care, demanding: Absent Violent and/or threatening violence toward people or property: Absent Explosive and/or unpredictable anger: Absent Rocking, rubbing, moaning, or other self-stimulating behavior: Absent Pulling at tubes, restraints, etc.: Absent Wandering from treatment areas: Absent Restlessness, pacing, excessive movement: Absent Repetitive behaviors, motor, and/or verbal: Absent Rapid, loud, or excessive talking: Absent Sudden changes of mood: Absent Easily initiated or excessive crying and/or laughter: Absent Self-abusiveness, physical and/or verbal: Absent Agitated behavior scale total score: 14  Therapy/Group: Individual Therapy  [name] 03/05/2021, 12:13 PM

## 2021-03-05 NOTE — Progress Notes (Signed)
Physical Therapy TBI Note  Patient Details  Name: Angel [name] MRN: [id number] Date of Birth: [birth year]  Today's Date: 03/05/2021 PT Individual Time: 1405-1430 PT Individual Time Calculation (min): 25 min   Short Term Goals: Week 1:  PT Short Term Goal 1 (Week 1): Pt will demonstrate improved standing balance requiring up to CGA/ MinA to maintain with RW for at least 3 min. PT Short Term Goal 2 (Week 1): Pt will perform sit <> stands with MinA to RW. PT Short Term Goal 3 (Week 1): Pt will perform stand pivot transfers using RW with MinA for posterior bias. PT Short Term Goal 4 (Week 1): Pt will ambulate at least 30 ft using RW with MinA.  Skilled Therapeutic Interventions/Progress Updates:    Pt received sitting in w/c reporting fatigue but, despite this eager to participate in therapy stating "lets see how much I have left in the tank."  Transported to/from gym in w/c for time management and energy conservation. Stand pivot w/c<>Nustep using RW with mod assist for lifting to stand and light mod assist for balance while turning due to mild posterior lean - requires a 2nd attempt to come to standing each time due to not being able to power up on the 1st attempt, cuing for increased anterior trunk lean - cuing for sequencing of LE stepping and AD management when turning. Performed B UE and B LE reciprocal movement patterns on Nustep against level 5 resistance for 1 minute transitioned to only using B LEs for 1 minute totaling 89 steps with pt quickly fatiguing against this level of resistance. After seated rest break, transitioned to only level 2 resistance with BUE and B LEs for 3 minutes totaling 188steps with pt continuing to demonstrate significant endurance impairments requiring a rest after 3 minutes with pt reporting working at 13/20 on Borg RPE scale. Transported back to room and pt requesting to remain in w/c - left with needs in reach and seat belt alarm on.   Therapy  Documentation Precautions:  Precautions Precautions: Fall Precaution Comments: watch BP, mild dizziness in standing, posterior bias in standing Restrictions Weight Bearing Restrictions: No   Pain:  No reports of pain throughout session.  Agitated Behavior Scale:  TBI Observation Details Observation Environment: CIR Start of observation period - Date: 03/05/21 Start of observation period - Time: 1405 End of observation period - Date: 03/05/21 End of observation period - Time: 1430 Agitated Behavior Scale (DO NOT LEAVE BLANKS) Short attention span, easy distractibility, inability to concentrate: Absent Impulsive, impatient, low tolerance for pain or frustration: Absent Uncooperative, resistant to care, demanding: Absent Violent and/or threatening violence toward people or property: Absent Explosive and/or unpredictable anger: Absent Rocking, rubbing, moaning, or other self-stimulating behavior: Absent Pulling at tubes, restraints, etc.: Absent Wandering from treatment areas: Absent Restlessness, pacing, excessive movement: Absent Repetitive behaviors, motor, and/or verbal: Absent Rapid, loud, or excessive talking: Absent Sudden changes of mood: Absent Easily initiated or excessive crying and/or laughter: Present to a slight degree Self-abusiveness, physical and/or verbal: Absent Agitated behavior scale total score: 15   Therapy/Group: Individual Therapy  Tawana Scale , PT, DPT, NCS, CSRS 03/05/2021, 1:02 PM

## 2021-03-05 NOTE — Progress Notes (Signed)
Physical Therapy TBI Note  Patient Details  Name: Angel [name] MRN: [id number] Date of Birth: Apr 20, 1951  Today's Date: 03/05/2021 PT Individual Time: 0907-1003  PT Individual Time Calculation (min): 56 min   Short Term Goals: Week 1:  PT Short Term Goal 1 (Week 1): Pt will demonstrate improved standing balance requiring up to CGA/ MinA to maintain with RW for at least 3 min. PT Short Term Goal 2 (Week 1): Pt will perform sit <> stands with MinA to RW. PT Short Term Goal 3 (Week 1): Pt will perform stand pivot transfers using RW with MinA for posterior bias. PT Short Term Goal 4 (Week 1): Pt will ambulate at least 30 ft using RW with MinA.  Skilled Therapeutic Interventions/Progress Updates:  Pt received sitting in w/c with his wife, [name], present and pt agreeable to therapy session. Donned tennis shoes total assist for time management.  Transported to/from gym in w/c for time management and energy conservation. Sit>stand w/c>RW with mod assist and multiple attempts in order to come to standing - cuing to push up with 1 hand from armrest and increase anterior trunk lean. R stand pivot to EOM using RW with heavy min assist for balance - some unsteadiness noted in knees but no buckling - cuing for AD management and small steps when turning.  Repeated sit<>stands to/from slightly elevated EOM to RW 2x 8reps with min/mod assist for lifting - cuing for anterior trunk lean followed by increased hip extension to bring trunk upright.   Gait training 3ft 2x using RW with heavy min assist of 1 and +2 w/c follow for safety - according to +2 assist from yesterday pt is ambulating with increased speed and stability - pt does have reciprocal stepping but continues to demo instability with increased WBing down through B UE support on RW. Gait training ~49ft using RW without w/c follow to target with heavy min assist as just described.  B LE strengthening via repeated step up/down on/off 1st 3" height step  using B HR support - requires light mod assist for balance and lifting with 1x R knee partial buckle with pt able to recover with assist and B UE support on HRs.   Standing tolerance and balance task focusing on decreased B UE support via completing board puzzle task with R UE - min assist throughout as pt starts to have repeated trunk collapsing forward requiring repeated verbal cuing to extend hips and maintain self upright.  Transported back to room and left seated in w/c with needs in reach and seat belt alarm on.  Denies pain during session.   Therapy Documentation Precautions:  Precautions Precautions: Fall Precaution Comments: watch BP, mild dizziness in standing, posterior bias in standing Restrictions Weight Bearing Restrictions: No   Agitated Behavior Scale: TBI Observation Details Observation Environment: CIR Start of observation period - Date: 03/05/21 Start of observation period - Time: 0907 End of observation period - Date: 03/05/21 End of observation period - Time: 1003 Agitated Behavior Scale (DO NOT LEAVE BLANKS) Short attention span, easy distractibility, inability to concentrate: Absent Impulsive, impatient, low tolerance for pain or frustration: Absent Uncooperative, resistant to care, demanding: Absent Violent and/or threatening violence toward people or property: Absent Explosive and/or unpredictable anger: Absent Rocking, rubbing, moaning, or other self-stimulating behavior: Absent Pulling at tubes, restraints, etc.: Absent Wandering from treatment areas: Absent Restlessness, pacing, excessive movement: Absent Repetitive behaviors, motor, and/or verbal: Absent Rapid, loud, or excessive talking: Absent Sudden changes of mood: Absent Easily initiated or  excessive crying and/or laughter: Present to a slight degree Self-abusiveness, physical and/or verbal: Absent Agitated behavior scale total score: 15    Therapy/Group: Individual Therapy  Tawana Scale , PT, DPT, NCS, CSRS 03/05/2021, 7:54 AM

## 2021-03-05 NOTE — Progress Notes (Signed)
[facility] PHYSICAL MEDICINE & REHABILITATION PROGRESS NOTE  Subjective/Complaints: Patient up in w/c. "Butt sore" but otherwise doing well. Sleeping/eating without any issues  ROS: Patient denies fever, rash, sore throat, blurred vision, nausea, vomiting, diarrhea, cough, shortness of breath or chest pain, joint or back pain, headache, or mood change.   Objective: Vital Signs: Blood pressure 138/63, pulse 63, temperature 97.6 F (36.4 C), resp. rate 20, height 5\' 8"  (1.727 m), weight 113.9 kg, SpO2 100 %. No results found. Recent Labs    03/04/21 0529  WBC 4.4  HGB 8.3*  HCT 27.1*  PLT 261   Recent Labs    03/04/21 0529  NA 138  K 3.6  CL 105  CO2 26  GLUCOSE 105*  BUN 18  CREATININE 1.10  CALCIUM 9.1    Intake/Output Summary (Last 24 hours) at 03/05/2021 0954 Last data filed at 03/05/2021 0720 Gross per 24 hour  Intake 472 ml  Output 700 ml  Net -228 ml        Physical Exam: BP 138/63 (BP Location: Right Arm)   Pulse 63   Temp 97.6 F (36.4 C)   Resp 20   Ht 5\' 8"  (1.727 m)   Wt 113.9 kg   SpO2 100%   BMI 38.16 kg/m  Constitutional: No distress . Vital signs reviewed. HEENT: NCAT, EOMI, oral membranes moist Neck: supple Cardiovascular: RRR without murmur. No JVD    Respiratory/Chest: CTA Bilaterally without wheezes or rales. Normal effort    GI/Abdomen: BS +, non-tender, non-distended Ext: no clubbing, cyanosis, or edema Psych: pleasant and cooperative  Skin: warm, trach stoma with hypergranulation tissue, otherwise closed Neuro: Alert Wife is at bedside.   Makes eye contact with examiner.   Provides his name and age. Fair insight and awareness. Oriented to person, place  Follows simple commands.   Motor: 5/5 in BUE , 4/5 in BLE   Assessment/Plan: 1. Functional deficits which require 3+ hours per day of interdisciplinary therapy in a comprehensive inpatient rehab setting. Physiatrist is providing close team supervision and 24 hour management  of active medical problems listed below. Physiatrist and rehab team continue to assess barriers to discharge/monitor patient progress toward functional and medical goals   Care Tool:  Bathing    Body parts bathed by patient: Right arm, Chest, Abdomen, Front perineal area, Right upper leg, Left upper leg, Face   Body parts bathed by helper: Left arm, Buttocks, Right lower leg, Left lower leg     Bathing assist Assist Level: Moderate Assistance - Patient 50 - 74%     Upper Body Dressing/Undressing Upper body dressing   What is the patient wearing?: Pull over shirt    Upper body assist Assist Level: Moderate Assistance - Patient 50 - 74%    Lower Body Dressing/Undressing Lower body dressing      What is the patient wearing?: Pants, Incontinence brief     Lower body assist Assist for lower body dressing: Total Assistance - Patient < 25%     Toileting Toileting    Toileting assist Assist for toileting: Total Assistance - Patient < 25%     Transfers Chair/bed transfer  Transfers assist     Chair/bed transfer assist level: Moderate Assistance - Patient 50 - 74%     Locomotion Ambulation   Ambulation assist      Assist level: Moderate Assistance - Patient 50 - 74% Assistive device: Walker-rolling Max distance: 35   Walk 10 feet activity   Assist  Walk 10  feet activity did not occur: Safety/medical concerns  Assist level: Moderate Assistance - Patient - 50 - 74% Assistive device: Walker-rolling   Walk 50 feet activity   Assist Walk 50 feet with 2 turns activity did not occur: Safety/medical concerns         Walk 150 feet activity   Assist Walk 150 feet activity did not occur: Safety/medical concerns         Walk 10 feet on uneven surface  activity   Assist Walk 10 feet on uneven surfaces activity did not occur: Safety/medical concerns         Wheelchair     Assist Is the patient using a wheelchair?: Yes Type of Wheelchair:  Manual    Wheelchair assist level: Total Assistance - Patient < 25% Max wheelchair distance: >200 ft    Wheelchair 50 feet with 2 turns activity    Assist        Assist Level: Total Assistance - Patient < 25%   Wheelchair 150 feet activity     Assist      Assist Level: Total Assistance - Patient < 25%  BP 138/63 (BP Location: Right Arm)   Pulse 63   Temp 97.6 F (36.4 C)   Resp 20   Ht 5\' 8"  (1.727 m)   Wt 113.9 kg   SpO2 100%   BMI 38.16 kg/m   Medical Problem List and Plan: 1.  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 Interdisciplinary team conference today to discuss goals, barriers to discharge, and dc planning.    2.  Antithrombotics: -DVT/anticoagulation:  Pharmaceutical: Other (comment)/Eliquis for pulmonary emboli             -antiplatelet therapy: N/A 3. Pain Management: Neurontin 300 mg twice daily, Robaxin 1000 mg every 8 hours, oxycodone as needed             Monitor for headaches with increased exertion  Controlled with meds on 10/18 4. Mood: Xanax 0.25 mg twice daily as needed             -antipsychotic agents: Seroquel 25 mg nightly 5. Neuropsych: This patient is not capable of making decisions on his own behalf. 6. Skin/Wound Care: will apply silver nitrate to trach site today 7. Fluids/Electrolytes/Nutrition: Routine in and outs             encourage appropriate PO 8.  VDRF.  Status post tracheostomy tube 01/14/2021.  Decannulated 02/26/2021  9.  Atrial fibrillation with RVR.  Follow-up cardiology services.  Amiodarone 200 mg daily, Lopressor 12.5 mg twice daily.               Monitor with increased activity  Controlled on 10/18 10.  Diabetes mellitus with hyperglycemia.  Currently on Semglee 16 units twice daily.  Check blood sugars before meals and at bedtime.  Patient on Glucotrol 2.5 mg daily, Tradjenta 5 mg daily,  Glucophage 1000 mg twice daily, Actos 45 mg daily prior to admission.  Resume oral agents as possible   Vitals:   03/04/21 1956 03/05/21 0418  BP: 137/68 138/63  Pulse: 69 63  Resp: 20 20  Temp: 98.4 F (36.9 C) 97.6 F (36.4 C)  SpO2: 97% 100%                 11.  Acute blood loss anemia.  Continue iron supplement.  Hemoglobin 8.4 on 10/13, 8.3 on 10/17,   hx hemorrhoids only one episode of overt blood in stool  -awaiting stool sample for OB 12.  Obesity.  BMI 32.74.  Dietary follow-up 13.  Slow transit constipation.  .  Bowel meds increased on 10/15  Improving 14.?  OSA  Continue [place] nightly 15.  Hx of right middle ear hemorrhage, temporal and occipital fx-  -cipro ear gtts per ENT?   LOS: 4 days A FACE TO FACE EVALUATION WAS PERFORMED  Meredith Staggers 03/05/2021, 9:54 AM

## 2021-03-05 NOTE — Progress Notes (Signed)
Patient ID: KADARIOUS DIKES, male   DOB: Aug 29, 1950, 70 y.o.   MRN: [id number]  SW made efforts to meet with pt to provide updates. Pt was not in room due to therapy. SW updated pt wife with updates from team conference, and d/c date 10/28. SW discussed family edu. She reported she is always here. SW informed will f/u with updates as they are available.   Loralee Pacas, MSW, [place] Office: [phone] Cell: [phone] Fax: [phone]

## 2021-03-06 ENCOUNTER — Ambulatory Visit: Payer: PPO | Admitting: Physician Assistant

## 2021-03-06 DIAGNOSIS — I4891 Unspecified atrial fibrillation: Secondary | ICD-10-CM

## 2021-03-06 DIAGNOSIS — Z86711 Personal history of pulmonary embolism: Secondary | ICD-10-CM

## 2021-03-06 DIAGNOSIS — D62 Acute posthemorrhagic anemia: Secondary | ICD-10-CM | POA: Diagnosis not present

## 2021-03-06 DIAGNOSIS — S069X0S Unspecified intracranial injury without loss of consciousness, sequela: Secondary | ICD-10-CM | POA: Diagnosis not present

## 2021-03-06 DIAGNOSIS — I10 Essential (primary) hypertension: Secondary | ICD-10-CM

## 2021-03-06 DIAGNOSIS — I2699 Other pulmonary embolism without acute cor pulmonale: Secondary | ICD-10-CM | POA: Diagnosis not present

## 2021-03-06 DIAGNOSIS — I609 Nontraumatic subarachnoid hemorrhage, unspecified: Secondary | ICD-10-CM

## 2021-03-06 DIAGNOSIS — R7309 Other abnormal glucose: Secondary | ICD-10-CM | POA: Diagnosis not present

## 2021-03-06 LAB — GLUCOSE, CAPILLARY
Glucose-Capillary: 98 mg/dL (ref 70–99)
Glucose-Capillary: 170 mg/dL — ABNORMAL HIGH (ref 70–99)
Glucose-Capillary: 218 mg/dL — ABNORMAL HIGH (ref 70–99)
Glucose-Capillary: 260 mg/dL — ABNORMAL HIGH (ref 70–99)

## 2021-03-06 MED ORDER — GLIPIZIDE 5 MG PO TABS
2.5000 mg | ORAL_TABLET | Freq: Every day | ORAL | Status: DC
  Administered 2021-03-06 – 2021-03-14 (×9): 2.5 mg via ORAL
  Filled 2021-03-06 (×9): qty 1

## 2021-03-06 NOTE — Progress Notes (Signed)
Speech Language Pathology TBI Note  Patient Details  Name: Angel [name] MRN: [id number] Date of Birth: 18-Jun-1950  Today's Date: 03/06/2021 SLP Individual Time: 1103-1594 SLP Individual Time Calculation (min): 55 min  Short Term Goals: Week 1: SLP Short Term Goal 1 (Week 1): Pt will participate in further cognitive assessment (CLQT, AFLA, etc.) to further direct POC SLP Short Term Goal 2 (Week 1): Pt will increase recall of novel information with 90% accuracy provided min A SLP Short Term Goal 3 (Week 1): Pt will perform alternating attention tasks provided mod A for use of strategies as trained SLP Short Term Goal 4 (Week 1): Pt will complete mildly complex problem solving tasks with 90% accuracy provided min A SLP Short Term Goal 5 (Week 1): Pt will ID 3 cognitive and physical changes as a result of TBI and hospitalization. SLP Short Term Goal 6 (Week 1): Pt will complete med management task with 90% accuracy provided min A  Skilled Therapeutic Interventions: Skilled treatment session focused on cognitive goals. SLP facilitated session by providing overall Min A verbal cues for error awareness during a complex medication management task. SLP also facilitated session by providing total A for recall of his current medications and their functions but asked appropriate questions regarding medications at discharge indicative of increased anticipatory awareness. Patient left upright in wheelchair with alarm on and all needs within reach. Continue with current plan of care.      Pain Pain Assessment Pain Scale: 0-10 Pain Score: 0-No pain  Agitated Behavior Scale: TBI Observation Details Observation Environment: Patient's room Start of observation period - Date: 03/06/21 Start of observation period - Time: 1400 End of observation period - Date: 03/06/21 End of observation period - Time: 1455 Agitated Behavior Scale (DO NOT LEAVE BLANKS) Short attention span, easy distractibility, inability  to concentrate: Absent Impulsive, impatient, low tolerance for pain or frustration: Absent Uncooperative, resistant to care, demanding: Absent Violent and/or threatening violence toward people or property: Absent Explosive and/or unpredictable anger: Absent Rocking, rubbing, moaning, or other self-stimulating behavior: Absent Pulling at tubes, restraints, etc.: Absent Wandering from treatment areas: Absent Restlessness, pacing, excessive movement: Absent Repetitive behaviors, motor, and/or verbal: Absent Rapid, loud, or excessive talking: Absent Sudden changes of mood: Absent Easily initiated or excessive crying and/or laughter: Absent Self-abusiveness, physical and/or verbal: Absent Agitated behavior scale total score: 14  Therapy/Group: Individual Therapy  Venicia Vandall 03/06/2021, 3:04 PM

## 2021-03-06 NOTE — Progress Notes (Addendum)
Occupational Therapy TBI Note  Patient Details  Name: Angel [name] MRN: [id number] Date of Birth: 05/24/1950  Today's Date: 03/06/2021 OT Individual Time: 0815-0915 OT Individual Time Calculation (min): 60 min   Today's Date: 03/06/2021 OT Individual Time: 1130-1200 OT Individual Time Calculation (min): 30 min   Short Term Goals: Week 1:  OT Short Term Goal 1 (Week 1): Pt will consistently transfer with MIN A to toilet OT Short Term Goal 2 (Week 1): Pt will thread BLE into pants with AE PRN OT Short Term Goal 3 (Week 1): Pt will groom wiht set up OT Short Term Goal 4 (Week 1): Pt will don shirt wiht MIN A  Skilled Therapeutic Interventions/Progress Updates:  Session I   Pt received in bed with no c/o pain and agreeable to OT session. Denies shower at this time.   ADL: Skilled interventions include: Pt completes EOB > wc transfer with RW and MIN A. Performs grooming activities seated at sink. Pt frequently commenting on how he would like to go home soon. Pt with incoordination noted in R hand, frequently knocking into objects. Pt able to complete UB dressing with close supervision. OTS provided assist to thread pants distally and pt stood at sink to pull pants over hips.   Therapeutic exercise Pt completes 2 x 20 dowel rod therex for BUE shoulder strengthening required for BADLs/functional transfers as follows with demo cuing and 3 # dowel rod. Pt eager to begin exercises and immediately began doing 20 reps because he "wants to get stronger".   Shoulder flex/ext, shoulder press Elbow flex/ext, chest press Wrist flex/ext  Pt left at end of session in wc with exit alarm on, call light in reach and all needs met.   Session II: Pt received in wc, wife present, with no c/o pain and agreeable to OT session focusing on activity tolerance in gym. OTS pushed wc to gym for time management.    Therapeutic exercise  Pt completes 2x20 ball volley with 4# dowel in seated in wc position o  improve BUE coordination/strengthening required for BADLs/functional transfers. 2 rest breaks required d/t slight SOB and fatigue. Pt able to dual task evidenced by carrying a conversation during activity. Overall, pt with good tolerance for activity.    Pt left at end of session in wc, wife present, with exit alarm on, call light in reach and all needs met.  Therapy Documentation Precautions:  Precautions Precautions: Fall Precaution Comments: watch BP, mild dizziness in standing, posterior bias in standing Restrictions Weight Bearing Restrictions: No    Agitated Behavior Scale: TBI  Observation Details Observation Environment: pts room/gym Start of observation period - Date: 03/06/21 Start of observation period - Time: 1130 End of observation period - Date: 03/06/21 End of observation period - Time: 1200 Agitated Behavior Scale (DO NOT LEAVE BLANKS) Short attention span, easy distractibility, inability to concentrate: Absent Impulsive, impatient, low tolerance for pain or frustration: Absent Uncooperative, resistant to care, demanding: Absent Violent and/or threatening violence toward people or property: Absent Explosive and/or unpredictable anger: Absent Rocking, rubbing, moaning, or other self-stimulating behavior: Absent Pulling at tubes, restraints, etc.: Absent Wandering from treatment areas: Absent Restlessness, pacing, excessive movement: Absent Repetitive behaviors, motor, and/or verbal: Absent Rapid, loud, or excessive talking: Absent Sudden changes of mood: Absent Easily initiated or excessive crying and/or laughter: Absent Self-abusiveness, physical and/or verbal: Absent Agitated behavior scale total score: 14 (scores same for both sessions)    Therapy/Group: Individual Therapy    03/06/2021, 6:56 AM 

## 2021-03-06 NOTE — Progress Notes (Signed)
Physical Therapy Session Note  Patient Details  Name: Angel [name] MRN: [id number] Date of Birth: [birth year]  Today's Date: 03/06/2021 PT Individual Time: 1305-1400 PT Individual Time Calculation (min): 55 min   Short Term Goals: Week 1:  PT Short Term Goal 1 (Week 1): Pt will demonstrate improved standing balance requiring up to CGA/ MinA to maintain with RW for at least 3 min. PT Short Term Goal 2 (Week 1): Pt will perform sit <> stands with MinA to RW. PT Short Term Goal 3 (Week 1): Pt will perform stand pivot transfers using RW with MinA for posterior bias. PT Short Term Goal 4 (Week 1): Pt will ambulate at least 30 ft using RW with MinA.  Skilled Therapeutic Interventions/Progress Updates:    Pt received sitting in w/c and appearing tired but eager to participate in therapy session. Donned tennis shoes total assist for time management.  Transported to/from gym in w/c for time management and energy conservation. Sit>stand w/c>RW with heavy min assist for lifting to stand - repeated cuing for increased anterior trunk lean/weight shift upon rising, improving ability to power up into standing.  Gait training 154ft using RW with min assist of 1 and therapist setting up environment to have multiple seats available but no w/c follow - demos improved knee stability compared to yesterday and slightly increased gait speed - continues to have reciprocal stepping pattern and continues to heavily rely on B UE support on RW to off weight LEs causing excessive anterior trunk flexion.  Standing balance and B LE strengthening task to promote WBing via alternate B LE foot taps on 4" progressed to 6" step using B UE support on RW but cuing for decreased B UE WBing and improved upright posture 3x 12 reps total.  Donned maxi-sky harness for safety. Gait training ~88ft 3x wearing harness for safety but not providing support, 2x using RW support and 1x with B HHA to promote increased B LE WBing and increased  upright posture - pt demos increased knee instability compared to earlier when compensating with UE support on RW, decreased gait speed, and increased time in double limb support. Doffed harness. Pt reporting B LE quad fatigue.  Standing tolerance and B LE strengthening task focusing on decreased B UE support while placing clothespins on rim of container with L UE and decreasing support through R UE on RW - min assist for balance - continues to have progression into anterior trunk/hip flexion requiring repeated cuing to engage glutes to maintain upright posture though improved compared to yesterday.  Stand pivot to w/c using RW with min assist. Transported back to room and left seated in w/c with needs in reach, seat belt alarm on, his wife present, and SLP arriving.   Therapy Documentation Precautions:  Precautions Precautions: Fall Precaution Comments: watch BP, mild dizziness in standing, posterior bias in standing Restrictions Weight Bearing Restrictions: No   Pain: Denies pain during session.    Therapy/Group: Individual Therapy  Tawana Scale , PT, DPT, NCS, CSRS 03/06/2021, 12:17 PM

## 2021-03-06 NOTE — Progress Notes (Signed)
[facility] PHYSICAL MEDICINE & REHABILITATION PROGRESS NOTE  Subjective/Complaints: Had a pretty good night. Feels a little sore from activity yesterday but otherwise doing well. Slept most of night  ROS: Patient denies fever, rash, sore throat, blurred vision, nausea, vomiting, diarrhea, cough, shortness of breath or chest pain,  headache, or mood change.   Objective: Vital Signs: Blood pressure 131/70, pulse 66, temperature 98.3 F (36.8 C), resp. rate 19, height 5\' 8"  (1.727 m), weight 113.9 kg, SpO2 97 %. No results found. Recent Labs    03/04/21 0529  WBC 4.4  HGB 8.3*  HCT 27.1*  PLT 261   Recent Labs    03/04/21 0529  NA 138  K 3.6  CL 105  CO2 26  GLUCOSE 105*  BUN 18  CREATININE 1.10  CALCIUM 9.1    Intake/Output Summary (Last 24 hours) at 03/06/2021 0833 Last data filed at 03/06/2021 0725 Gross per 24 hour  Intake 838 ml  Output 300 ml  Net 538 ml        Physical Exam: BP 131/70 (BP Location: Right Arm)   Pulse 66   Temp 98.3 F (36.8 C)   Resp 19   Ht 5\' 8"  (1.727 m)   Wt 113.9 kg   SpO2 97%   BMI 38.16 kg/m  Constitutional: No distress . Vital signs reviewed. HEENT: NCAT, EOMI, oral membranes moist Neck: supple, trach stoma closed  Cardiovascular: RRR without murmur. No JVD    Respiratory/Chest: CTA Bilaterally without wheezes or rales. Normal effort    GI/Abdomen: BS +, non-tender, non-distended Ext: no clubbing, cyanosis, or edema Psych: pleasant and cooperative  Skin: warm, trach stoma no longer with hypergranulation tissue  Neuro: Alert and oriented to person, place, date, reason    Fair insight and awareness.   Follows all simple commands.   Motor: 5/5 in BUE , 3-4/5 in BLE prox to distal  Assessment/Plan: 1. Functional deficits which require 3+ hours per day of interdisciplinary therapy in a comprehensive inpatient rehab setting. Physiatrist is providing close team supervision and 24 hour management of active medical problems  listed below. Physiatrist and rehab team continue to assess barriers to discharge/monitor patient progress toward functional and medical goals   Care Tool:  Bathing    Body parts bathed by patient: Right arm, Chest, Abdomen, Front perineal area, Right upper leg, Left upper leg, Face   Body parts bathed by helper: Left arm, Buttocks, Right lower leg, Left lower leg     Bathing assist Assist Level: Moderate Assistance - Patient 50 - 74%     Upper Body Dressing/Undressing Upper body dressing   What is the patient wearing?: Pull over shirt    Upper body assist Assist Level: Moderate Assistance - Patient 50 - 74%    Lower Body Dressing/Undressing Lower body dressing      What is the patient wearing?: Pants, Incontinence brief     Lower body assist Assist for lower body dressing: Total Assistance - Patient < 25%     Toileting Toileting    Toileting assist Assist for toileting: Total Assistance - Patient < 25%     Transfers Chair/bed transfer  Transfers assist     Chair/bed transfer assist level: Moderate Assistance - Patient 50 - 74% Chair/bed transfer assistive device: Armrests, Programmer, multimedia   Ambulation assist      Assist level: 2 helpers (heavy min A Of 1 and +2 w/c follow) Assistive device: Walker-rolling Max distance: 64ft   Walk 10  feet activity   Assist  Walk 10 feet activity did not occur: Safety/medical concerns  Assist level: Moderate Assistance - Patient - 50 - 74% Assistive device: Walker-rolling   Walk 50 feet activity   Assist Walk 50 feet with 2 turns activity did not occur: Safety/medical concerns         Walk 150 feet activity   Assist Walk 150 feet activity did not occur: Safety/medical concerns         Walk 10 feet on uneven surface  activity   Assist Walk 10 feet on uneven surfaces activity did not occur: Safety/medical concerns         Wheelchair     Assist Is the patient using a  wheelchair?: Yes Type of Wheelchair: Manual    Wheelchair assist level: Total Assistance - Patient < 25% Max wheelchair distance: >200 ft    Wheelchair 50 feet with 2 turns activity    Assist        Assist Level: Total Assistance - Patient < 25%   Wheelchair 150 feet activity     Assist      Assist Level: Total Assistance - Patient < 25%  BP 131/70 (BP Location: Right Arm)   Pulse 66   Temp 98.3 F (36.8 C)   Resp 19   Ht 5\' 8"  (1.727 m)   Wt 113.9 kg   SpO2 97%   BMI 38.16 kg/m   Medical Problem List and Plan: 1.  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 Making daily gains with mobility 2.  Antithrombotics: -DVT/anticoagulation:  Pharmaceutical: Other (comment)/Eliquis for pulmonary emboli             -antiplatelet therapy: N/A 3. Pain Management: Neurontin 300 mg twice daily, Robaxin 1000 mg every 8 hours, oxycodone as needed             Monitor for headaches with increased exertion  Controlled with meds on 10/19 4. Mood: Xanax 0.25 mg twice daily as needed             -antipsychotic agents: Seroquel 25 mg nightly 5. Neuropsych: This patient is not capable of making decisions on his own behalf. 6. Skin/Wound Care: will apply silver nitrate to trach site today 7. Fluids/Electrolytes/Nutrition: Routine in and outs              -eating 100% 8.  VDRF.  Status post tracheostomy tube 01/14/2021.  Decannulated 02/26/2021  9.  Atrial fibrillation with RVR.  Follow-up cardiology services.  Amiodarone 200 mg daily, Lopressor 12.5 mg twice daily.               Monitor with increased activity  Controlled on 10/19 10.  Diabetes mellitus with hyperglycemia.  Currently on Semglee 16 units twice daily.  Check blood sugars before meals and at bedtime.  Patient on Glucotrol 2.5 mg daily, Tradjenta 5 mg daily, Glucophage 1000 mg twice daily, Actos 45  mg daily prior to admission.  Resume oral agents as possible   CBG (last 3)  Recent Labs    03/05/21 1656 03/05/21 2111 03/06/21 0613  GLUCAP 256* 180* 98    10/19 resume glucotrol today                11.  Acute blood loss anemia.  Continue iron supplement.               Hemoglobin 8.4  on 10/13, 8.3 on 10/17,   hx hemorrhoids only one episode of overt blood in stool  -still awaiting stool sample for OB -recheck cbc 10/20 12.  Obesity.  BMI 32.74.  Dietary follow-up 13.  Slow transit constipation.  .  Bowel meds increased on 10/15  Improving 14.?  OSA  Continue Kimball nightly 15.  Hx of right middle ear hemorrhage, temporal and occipital fx-  -cipro ear gtts     LOS: 5 days A FACE TO FACE EVALUATION WAS PERFORMED  Meredith Staggers 03/06/2021, 8:33 AM

## 2021-03-07 DIAGNOSIS — D62 Acute posthemorrhagic anemia: Secondary | ICD-10-CM | POA: Diagnosis not present

## 2021-03-07 DIAGNOSIS — R7309 Other abnormal glucose: Secondary | ICD-10-CM | POA: Diagnosis not present

## 2021-03-07 DIAGNOSIS — S069X0S Unspecified intracranial injury without loss of consciousness, sequela: Secondary | ICD-10-CM | POA: Diagnosis not present

## 2021-03-07 DIAGNOSIS — I2699 Other pulmonary embolism without acute cor pulmonale: Secondary | ICD-10-CM | POA: Diagnosis not present

## 2021-03-07 LAB — CBC
HCT: 26.4 % — ABNORMAL LOW (ref 39.0–52.0)
Hemoglobin: 8.4 g/dL — ABNORMAL LOW (ref 13.0–17.0)
MCH: 29.4 pg (ref 26.0–34.0)
MCHC: 31.8 g/dL (ref 30.0–36.0)
MCV: 92.3 fL (ref 80.0–100.0)
Platelets: 245 10*3/uL (ref 150–400)
RBC: 2.86 MIL/uL — ABNORMAL LOW (ref 4.22–5.81)
RDW: 17.8 % — ABNORMAL HIGH (ref 11.5–15.5)
WBC: 5.4 10*3/uL (ref 4.0–10.5)
nRBC: 0 % (ref 0.0–0.2)

## 2021-03-07 LAB — GLUCOSE, CAPILLARY
Glucose-Capillary: 87 mg/dL (ref 70–99)
Glucose-Capillary: 208 mg/dL — ABNORMAL HIGH (ref 70–99)
Glucose-Capillary: 214 mg/dL — ABNORMAL HIGH (ref 70–99)
Glucose-Capillary: 115 mg/dL — ABNORMAL HIGH (ref 70–99)

## 2021-03-07 MED ORDER — PIOGLITAZONE HCL 15 MG PO TABS
15.0000 mg | ORAL_TABLET | Freq: Every day | ORAL | Status: DC
  Administered 2021-03-07 – 2021-03-15 (×9): 15 mg via ORAL
  Filled 2021-03-07 (×9): qty 1

## 2021-03-07 NOTE — Progress Notes (Signed)
Occupational Therapy TBI Note  Patient Details  Name: Angel [name] MRN: [id number] Date of Birth: Jun 15, 1950  Today's Date: 03/07/2021 OT Group Time: 8592-9244 OT Group Time Calculation (min): 60 min   Short Term Goals: Week 1:  OT Short Term Goal 1 (Week 1): Pt will consistently transfer with MIN A to toilet OT Short Term Goal 2 (Week 1): Pt will thread BLE into pants with AE PRN OT Short Term Goal 3 (Week 1): Pt will groom wiht set up OT Short Term Goal 4 (Week 1): Pt will don shirt wiht MIN A  Skilled Therapeutic Interventions/Progress Updates:  Pt participated in group session with a focus on stress mgmt, education on healthy coping strategies, and social interaction. Focus of session on providing coping strategies to manage new current level of function as a result of new diagnosis.  Session focus on breaking down stressors into "daily hassles," "major life stressors" and "life circumstances" in an effort to allow pts to chunk their stressors into groups. Pt initially quiet during session but did share some of his current stressors as well as appreciation for learning a lot of new things while in rehab. Offered education on factors that protect [place] against stress such as "daily uplifts," "healthy coping strategies" and "protective factors." Encouraged all group members to make an effort to actively recall one event from their day that was a daily uplift in an effort to protect their mindset from stressors. Issued pt handouts on healthy coping strategies to implement into routine. Pt transported back to room by RT. RT reports that pt really appreciated group session.   Therapy Documentation Precautions:  Precautions Precautions: Fall Precaution Comments: watch BP, mild dizziness in standing, posterior bias in standing Restrictions Weight Bearing Restrictions: No  Pain: pt reports no pain during session    Agitated Behavior Scale: TBI Observation Details Observation Environment:  Patient's room Start of observation period - Date: 03/07/21 Start of observation period - Time: 1300 End of observation period - Date: 03/07/21 End of observation period - Time: 1355 Agitated Behavior Scale (DO NOT LEAVE BLANKS) Short attention span, easy distractibility, inability to concentrate: Absent Impulsive, impatient, low tolerance for pain or frustration: Absent Uncooperative, resistant to care, demanding: Absent Violent and/or threatening violence toward people or property: Absent Explosive and/or unpredictable anger: Absent Rocking, rubbing, moaning, or other self-stimulating behavior: Absent Pulling at tubes, restraints, etc.: Absent Wandering from treatment areas: Absent Restlessness, pacing, excessive movement: Absent Repetitive behaviors, motor, and/or verbal: Absent Rapid, loud, or excessive talking: Absent Sudden changes of mood: Absent Easily initiated or excessive crying and/or laughter: Absent Self-abusiveness, physical and/or verbal: Absent Agitated behavior scale total score: 14    Therapy/Group: Group Therapy  Precious Haws 03/07/2021, 3:55 PM

## 2021-03-07 NOTE — Progress Notes (Signed)
Occupational Therapy TBI Note  Patient Details  Name: VESTAL MARKIN MRN: [id number] Date of Birth: March 06, 1951  Today's Date: 03/07/2021 OT Individual Time:735  - 815   40 min   Short Term Goals: Week 1:  OT Short Term Goal 1 (Week 1): Pt will consistently transfer with MIN A to toilet OT Short Term Goal 2 (Week 1): Pt will thread BLE into pants with AE PRN OT Short Term Goal 3 (Week 1): Pt will groom wiht set up OT Short Term Goal 4 (Week 1): Pt will don shirt wiht MIN A  Skilled Therapeutic Interventions/Progress Updates:   Pt received in bed, asleep but easily awoken with wife present with no c/o pain.   ADL: Skilled interventions include: Pt transitions from EOB to wc using stand > pivot with RW and min A. Completes grooming activities at sink level. Able to don V-neck shirt with setup. OTS educated on use of reacher to increase independence for LB dressing. Pt able to pull pants over feet using reacher and min cuing. Stands at sink with MIN A to hike pants over hips. Pt demo'd good motivation and tolerance throughout session   Therapeutic activity Pt completes sit > stands at sink level from wc. Pt able to tolerate transitioning from sitting > standing with ~10 second rest break in between. OT facilitates grading of activity with varying UE support. Pt with good response to activity with increased effort to power up.   Pt left at end of session in wc with exit belt alarm on, call light in reach and all needs met   Therapy Documentation Precautions:  Precautions Precautions: Fall Precaution Comments: watch BP, mild dizziness in standing, posterior bias in standing Restrictions Weight Bearing Restrictions: No Agitated Behavior Scale: TBI Observation Details Observation Environment: Pt's room Start of observation period - Date: 03/07/21 Start of observation period - Time: 0730 End of observation period - Date: 03/07/21 End of observation period - Time: 0815 Agitated  Behavior Scale (DO NOT LEAVE BLANKS) Short attention span, easy distractibility, inability to concentrate: Absent Impulsive, impatient, low tolerance for pain or frustration: Absent Uncooperative, resistant to care, demanding: Absent Violent and/or threatening violence toward people or property: Absent Explosive and/or unpredictable anger: Absent Rocking, rubbing, moaning, or other self-stimulating behavior: Absent Pulling at tubes, restraints, etc.: Absent Wandering from treatment areas: Absent Restlessness, pacing, excessive movement: Absent Repetitive behaviors, motor, and/or verbal: Absent Rapid, loud, or excessive talking: Absent Sudden changes of mood: Absent Easily initiated or excessive crying and/or laughter: Absent Self-abusiveness, physical and/or verbal: Absent Agitated behavior scale total score: 14     Therapy/Group: Individual Therapy  Jenn Worischeck 03/07/2021, 7:12 AM

## 2021-03-07 NOTE — Progress Notes (Signed)
Speech Language Pathology TBI Note  Patient Details  Name: Angel [name] MRN: [id number] Date of Birth: 04-30-1951  Today's Date: 03/07/2021 SLP Individual Time: 1300-1355 SLP Individual Time Calculation (min): 55 min  Short Term Goals: Week 1: SLP Short Term Goal 1 (Week 1): Pt will participate in further cognitive assessment (CLQT, AFLA, etc.) to further direct POC SLP Short Term Goal 2 (Week 1): Pt will increase recall of novel information with 90% accuracy provided min A SLP Short Term Goal 3 (Week 1): Pt will perform alternating attention tasks provided mod A for use of strategies as trained SLP Short Term Goal 4 (Week 1): Pt will complete mildly complex problem solving tasks with 90% accuracy provided min A SLP Short Term Goal 5 (Week 1): Pt will ID 3 cognitive and physical changes as a result of TBI and hospitalization. SLP Short Term Goal 6 (Week 1): Pt will complete med management task with 90% accuracy provided min A  Skilled Therapeutic Interventions: Skilled treatment session focused on cognitive goals. SLP facilitated session by providing overall supervision level verbal cues for functional problem solving during a complex medication management task. Supervision level verbal cues were also needed for divided attention between task and a functional conversation for ~45 minutes. Patient requested to use the bathroom and was continent of urine with supervision level verbal cues needed for safety with task. Patient left upright in wheelchair with alarm on and all needs within reach. Continue with current plan of care.      Pain No/Denies Pain   Agitated Behavior Scale: TBI Observation Details Observation Environment: Patient's room Start of observation period - Date: 03/07/21 Start of observation period - Time: 1300 End of observation period - Date: 03/07/21 End of observation period - Time: 1355 Agitated Behavior Scale (DO NOT LEAVE BLANKS) Short attention span, easy  distractibility, inability to concentrate: Absent Impulsive, impatient, low tolerance for pain or frustration: Absent Uncooperative, resistant to care, demanding: Absent Violent and/or threatening violence toward people or property: Absent Explosive and/or unpredictable anger: Absent Rocking, rubbing, moaning, or other self-stimulating behavior: Absent Pulling at tubes, restraints, etc.: Absent Wandering from treatment areas: Absent Restlessness, pacing, excessive movement: Absent Repetitive behaviors, motor, and/or verbal: Absent Rapid, loud, or excessive talking: Absent Sudden changes of mood: Absent Easily initiated or excessive crying and/or laughter: Absent Self-abusiveness, physical and/or verbal: Absent Agitated behavior scale total score: 14  Therapy/Group: Individual Therapy  Angel [name] 03/07/2021, 3:34 PM

## 2021-03-07 NOTE — Evaluation (Signed)
Recreational Therapy Assessment and Plan  Patient Details  Name: CASPER PAGLIUCA MRN: [id number] Date of Birth: [birth year] Today's Date: 03/07/2021  Rehab Potential:  Good ELOS:   10/28  Assessment  Hospital Problem: Principal Problem:   TBI (traumatic brain injury) Active Problems:   Supplemental oxygen dependent   Labile blood glucose   Acute pulmonary embolism without acute cor pulmonale (HCC)     Past Medical History:      Past Medical History:  Diagnosis Date   DM (diabetes mellitus) ([place])     HLD (hyperlipidemia)     Hypertension      Past Surgical History:       Past Surgical History:  Procedure Laterality Date   IR FLUORO GUIDE CV LINE RIGHT   02/04/2021   IR REMOVAL TUN CV CATH W/O FL   02/14/2021   IR US GUIDE VASC ACCESS RIGHT   02/04/2021   TRACHEOSTOMY TUBE PLACEMENT N/A 01/14/2021    Procedure: TRACHEOSTOMY;  Surgeon: Jesusita Oka, MD;  Location: [facility] OR;  Service: General;  Laterality: N/A;      Assessment & Plan Clinical Impression: Patient is a 70 y.o. right-handed male with history of diabetes mellitus, hyperlipidemia and hypertension.  History taken from chart review and patient.  Patient lives with spouse.  1 level home with level entry.  Independent prior to admission.  He presented on 12/28/2020 after a fall down approximately 8 steps while going down to his basement with +LOC.  Patient reportedly agitated in route confused with emesis x2.  He did have blood coming from his right ear.  He required intubation for airway protection.  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  Additional extra-axial hemorrhage over the right convexity.  Areas of subarachnoid hemorrhage in the anterior frontal lobes bilaterally with blood along the falx likely subdural.  Minimally displaced right occipital skull fracture.  A longitudinal right temporal bone fracture extends through the right middle ear  cavity.  CT cervical spine negative.  CT of chest abdomen pelvis showed no evidence of acute trauma.  There was an incidental finding of 2.9 cm heterogeneous right thyroid nodule present recommend thyroid ultrasound as outpatient.  Admission chemistries unremarkable except potassium 3.3, glucose 166, alcohol negative, hemoglobin 10.8, lactic acid 4.0.  Neurosurgery follow-up for [facility] Dr. Annette Stable recommended conservative care.  Maintained on Keppra x7 days for seizure prophylaxis.  ENT Dr. Constance Holster in regards to temporal bone fractures again advised conservative care.  Hospital course cardiology services consulted 01/07/2021 for evaluation of atrial fibrillation with RVR and started on amiodarone with bolus load.  Echocardiogram with ejection fraction of 60 to 65%, no wall motion abnormalities.  CT angiogram of the chest showed bilateral pulmonary emboli most significantly affecting the right lower lobe pulmonary artery with associated reduced perfusion of the posterior medial basilar segments of the right lower lobe.  Vascular ultrasound left upper extremity findings consistent with age-indeterminate superficial vein thrombosis involving the left cephalic vein and left basilic vein on 9/73/5329.  Patient underwent placement of tracheostomy 01/14/2021 per Dr.Lovick.  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  His tracheostomy tube was downsized to a #6 cuffless 02/05/2021 and to a #4 cuffless 02/20/2021 and decannulated 02/26/2021.  Hospital course complicated by AKI with nephrology services consulted 01/13/2021 with creatinine 4.5-4.96 felt most likely secondary to hypotensive episode suspect hospital course sepsis and IV contrast.  He did require short course of hemodialysis and tunneled catheter had been  placed with latest hemodialysis 02/08/2021 and AKI resolved with latest creatinine 1.06 and tunneled catheter has since been removed.  Initially with nasogastric tube feeds for nutritional support and his diet has been advanced to a regular consistency.  Hospital course acute blood loss anemia transfuse 1 unit packed red blood cells 02/04/2021 with latest hemoglobin 8.4.  Palliative care has been consulted to establish goals of care.  Therapy evaluations completed due to patient's TBI/SAH was admitted for a comprehensive rehab program.  Patient with resulting functional deficits with mobility and self-care.  Patient transferred to CIR on 03/01/2021 .    Pt presents with decreased activity tolerance, decreased functional mobility, decreased balance, decreased coordination, decreased safety awareness, decreased memory, feeling of stress Limiting pt's independence with leisure/community pursuits.   Plan  Min 1 TR session >20 minutes during LOS  Recommendations for other services: Neuropsych  Discharge Criteria: Patient will be discharged from TR if patient refuses treatment 3 consecutive times without medical reason.  If treatment goals not met, if there is a change in medical status, if patient makes no progress towards goals or if patient is discharged from hospital.  The above assessment, treatment plan, treatment alternatives and goals were discussed and mutually agreed upon: by patient  Session Note: No c/o pain, but pt shifting around uncomfortably in w/c through session. Pt participated in stress managment/coping group today.  Pt education/discussion focused on stress exploration including factors that contribute to stress, factors that protect against stress and potential coping strategies.  Coping strategies included deep breathing, progressive muscle relaxation, imagery & challenging irrational thoughts.  Handouts provided.  Pt appreciative of this session.   [place] 03/07/2021, 12:19  PM

## 2021-03-07 NOTE — Progress Notes (Signed)
Physical Therapy TBI Note  Patient Details  Name: Angel [name] MRN: [id number] Date of Birth: 1950-12-23  Today's Date: 03/07/2021 PT Individual Time: 0910-0935 PT Individual Time Calculation (min): 25 min   Short Term Goals: Week 1:  PT Short Term Goal 1 (Week 1): Pt will demonstrate improved standing balance requiring up to CGA/ MinA to maintain with RW for at least 3 min. PT Short Term Goal 2 (Week 1): Pt will perform sit <> stands with MinA to RW. PT Short Term Goal 3 (Week 1): Pt will perform stand pivot transfers using RW with MinA for posterior bias. PT Short Term Goal 4 (Week 1): Pt will ambulate at least 30 ft using RW with MinA. Week 2:    Week 3:     Skilled Therapeutic Interventions/Progress Updates:   Pt received sitting in WC and agreeable to PT.   Pt performed 5 time sit<>stand (5xSTS): 48 sec (>15 sec indicates increased fall risk) min fading to mod assist on 5th attempt  Standing balance on red wedge x 1.5 min with BUE support min assist to prevent posterior bias   Gait training in room forward 38f +  and then forward/reverse reverse 2 x 68feach with min assist for safety and cues for increased step length and improved posture.   [facility] reach and all needs met.         Therapy Documentation Precautions:  Precautions Precautions: Fall Precaution Comments: watch BP, mild dizziness in standing, posterior bias in standing Restrictions Weight Bearing Restrictions: No Pain: Pain Assessment Pain Scale: 0-10 Pain Score: 0-No pain Agitated Behavior Scale: TBI Observation Details Start of observation period - Date: 03/07/21 Start of observation period - Time: 0840 End of observation period - Date: 03/07/21 End of observation period - Time: 0936 Agitated Behavior Scale (DO NOT LEAVE BLANKS) Short attention span, easy distractibility, inability to concentrate: Absent Impulsive, impatient, low tolerance for pain or  frustration: Absent Uncooperative, resistant to care, demanding: Absent Violent and/or threatening violence toward people or property: Absent Explosive and/or unpredictable anger: Absent Rocking, rubbing, moaning, or other self-stimulating behavior: Absent Pulling at tubes, restraints, etc.: Absent Wandering from treatment areas: Absent Restlessness, pacing, excessive movement: Absent Repetitive behaviors, motor, and/or verbal: Absent Rapid, loud, or excessive talking: Absent Sudden changes of mood: Absent Easily initiated or excessive crying and/or laughter: Absent Self-abusiveness, physical and/or verbal: Absent Agitated behavior scale total score: 14    Therapy/Group: Individual Therapy  AuLorie Phenix0/20/2022, 9:38 AM

## 2021-03-07 NOTE — Progress Notes (Signed)
[place] PHYSICAL MEDICINE & REHABILITATION PROGRESS NOTE  Subjective/Complaints: Pt in good spirits. Was tired at the end of the day yesterday but pleased with continued progress!  ROS: Patient denies fever, rash, sore throat, blurred vision, nausea, vomiting, diarrhea, cough, shortness of breath or chest pain, joint or back pain, headache, or mood change. .   Objective: Vital Signs: Blood pressure 121/68, pulse 61, temperature 98 F (36.7 C), temperature source Oral, resp. rate 17, height 5\' 8"  (1.727 m), weight 113.9 kg, SpO2 100 %. No results found. Recent Labs    03/07/21 0507  WBC 5.4  HGB 8.4*  HCT 26.4*  PLT 245   No results for input(s): NA, K, CL, CO2, GLUCOSE, BUN, CREATININE, CALCIUM in the last 72 hours.   Intake/Output Summary (Last 24 hours) at 03/07/2021 1012 Last data filed at 03/07/2021 0446 Gross per 24 hour  Intake 358 ml  Output 450 ml  Net -92 ml        Physical Exam: BP 121/68 (BP Location: Right Arm)   Pulse 61   Temp 98 F (36.7 C) (Oral)   Resp 17   Ht 5\' 8"  (1.727 m)   Wt 113.9 kg   SpO2 100%   BMI 38.16 kg/m  Constitutional: No distress . Vital signs reviewed. HEENT: NCAT, EOMI, oral membranes moist Neck: supple Cardiovascular: RRR without murmur. No JVD    Respiratory/Chest: CTA Bilaterally without wheezes or rales. Normal effort    GI/Abdomen: BS +, non-tender, non-distended Ext: no clubbing, cyanosis, or edema Psych: pleasant and cooperative  Skin: warm, trach stoma again with small amt of granulation tissue. Neuro: Alert and oriented to person, place, date, reason   Improved insight and awareness.   Follows all simple commands.   Motor: 5/5 in BUE , 3-4/5 in BLE prox to distal  Assessment/Plan: 1. Functional deficits which require 3+ hours per day of interdisciplinary therapy in a comprehensive inpatient rehab setting. Physiatrist is providing close team supervision and 24 hour management of active medical problems listed  below. Physiatrist and rehab team continue to assess barriers to discharge/monitor patient progress toward functional and medical goals   Care Tool:  Bathing    Body parts bathed by patient: Right arm, Chest, Abdomen, Front perineal area, Right upper leg, Left upper leg, Face   Body parts bathed by helper: Left arm, Buttocks, Right lower leg, Left lower leg     Bathing assist Assist Level: Moderate Assistance - Patient 50 - 74%     Upper Body Dressing/Undressing Upper body dressing   What is the patient wearing?: Pull over shirt    Upper body assist Assist Level: Moderate Assistance - Patient 50 - 74%    Lower Body Dressing/Undressing Lower body dressing      What is the patient wearing?: Pants, Incontinence brief     Lower body assist Assist for lower body dressing: Total Assistance - Patient < 25%     Toileting Toileting    Toileting assist Assist for toileting: Total Assistance - Patient < 25%     Transfers Chair/bed transfer  Transfers assist     Chair/bed transfer assist level: Minimal Assistance - Patient > 75% Chair/bed transfer assistive device: Walker, Clinical biochemist   Ambulation assist      Assist level: Minimal Assistance - Patient > 75% Assistive device: Walker-rolling Max distance: 160ft   Walk 10 feet activity   Assist  Walk 10 feet activity did not occur: Safety/medical concerns  Assist level: Moderate  Assistance - Patient - 50 - 74% Assistive device: Walker-rolling   Walk 50 feet activity   Assist Walk 50 feet with 2 turns activity did not occur: Safety/medical concerns         Walk 150 feet activity   Assist Walk 150 feet activity did not occur: Safety/medical concerns         Walk 10 feet on uneven surface  activity   Assist Walk 10 feet on uneven surfaces activity did not occur: Safety/medical concerns         Wheelchair     Assist Is the patient using a wheelchair?: Yes Type of  Wheelchair: Manual    Wheelchair assist level: Total Assistance - Patient < 25% Max wheelchair distance: >200 ft    Wheelchair 50 feet with 2 turns activity    Assist        Assist Level: Total Assistance - Patient < 25%   Wheelchair 150 feet activity     Assist      Assist Level: Total Assistance - Patient < 25%  BP 121/68 (BP Location: Right Arm)   Pulse 61   Temp 98 F (36.7 C) (Oral)   Resp 17   Ht 5\' 8"  (1.727 m)   Wt 113.9 kg   SpO2 100%   BMI 38.16 kg/m   Medical Problem List and Plan: 1.  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  Antithrombotics: -DVT/anticoagulation:  Pharmaceutical: Other (comment)/Eliquis for pulmonary emboli             -antiplatelet therapy: N/A 3. Pain Management: Neurontin 300 mg twice daily, Robaxin 1000 mg every 8 hours, oxycodone as needed             Monitor for headaches with increased exertion  Controlled with meds on 10/20 4. Mood: Xanax 0.25 mg twice daily as needed             -antipsychotic agents: Seroquel 25 mg nightly 5. Neuropsych: This patient is not capable of making decisions on his own behalf. 6. Skin/Wound Care: will apply silver nitrate to trach site today 7. Fluids/Electrolytes/Nutrition: Routine in and outs              -eating 100% 8.  VDRF.  Status post tracheostomy tube 01/14/2021.  Decannulated 02/26/2021  9.  Atrial fibrillation with RVR.  Follow-up cardiology services.  Amiodarone 200 mg daily, Lopressor 12.5 mg twice daily.               Monitor with increased activity  Controlled on 10/20 10.  Diabetes mellitus with hyperglycemia.  Currently on Semglee 16 units twice daily.  Check blood sugars before meals and at bedtime.  Patient on Glucotrol 2.5 mg daily, Tradjenta 5 mg daily, Glucophage 1000 mg twice daily, Actos 45 mg daily prior to admission.  Resume oral agents as  possible   CBG (last 3)  Recent Labs    03/06/21 1653 03/06/21 2055 03/07/21 0610  GLUCAP 218* 260* 87    10/19 resumed glucotrol     10/20 resume actos 15mg  today              11.  Acute blood loss anemia.  Continue iron supplement.               Hemoglobin 8.4 on 10/13, 8.3 on 10/17,   hx hemorrhoids only one episode of overt blood in  stool  -still awaiting stool sample for OB - hgb stable at 8.4 10/20 12.  Obesity.  BMI 32.74.  Dietary follow-up 13.  Slow transit constipation.  .  Bowel meds increased on 10/15  Improving 14.?  OSA  Continue [place] nightly 15.  Hx of right middle ear hemorrhage, temporal and occipital fx-  -cipro ear gtts     LOS: 6 days A FACE TO FACE EVALUATION WAS PERFORMED  Meredith Staggers 03/07/2021, 10:12 AM

## 2021-03-08 LAB — GLUCOSE, CAPILLARY
Glucose-Capillary: 92 mg/dL (ref 70–99)
Glucose-Capillary: 151 mg/dL — ABNORMAL HIGH (ref 70–99)
Glucose-Capillary: 241 mg/dL — ABNORMAL HIGH (ref 70–99)
Glucose-Capillary: 210 mg/dL — ABNORMAL HIGH (ref 70–99)

## 2021-03-08 MED ORDER — SORBITOL 70 % SOLN
60.0000 mL | Freq: Once | Status: DC
Start: 2021-03-09 — End: 2021-03-15
  Filled 2021-03-08 (×3): qty 60

## 2021-03-08 MED ORDER — SILVER NITRATE-POT NITRATE 75-25 % EX MISC
2.0000 | Freq: Once | CUTANEOUS | Status: AC
  Administered 2021-03-08: 2 via TOPICAL
  Filled 2021-03-08: qty 2

## 2021-03-08 NOTE — Progress Notes (Signed)
Physical Therapy Weekly Progress Note  Patient Details  Name: Angel [name] MRN: [id number] Date of Birth: May 13, 1951  Beginning of progress report period: March 02, 2021 End of progress report period: March 08, 2021  Today's Date: 03/08/2021 PT Individual Time: 1305-1400 PT Individual Time Calculation (min): 55 min   Patient has met 4 of 4 short term goals.    He has made good progress w/all aspects of functional mobility.  He continues to demonstrate mild post tendency w/dynamic balance, but this is improving.   Patient continues to demonstrate the following deficits muscle weakness and muscle joint tightness, decreased cardiorespiratoy endurance, and unbalanced muscle activation and therefore will continue to benefit from skilled PT intervention to increase functional independence with mobility.  Patient progressing toward long term goals..  Continue plan of care.  PT Short Term Goals Week 1:  PT Short Term Goal 1 (Week 1): Pt will demonstrate improved standing balance requiring up to CGA/ MinA to maintain with RW for at least 3 min. PT Short Term Goal 2 (Week 1): Pt will perform sit <> stands with MinA to RW. PT Short Term Goal 3 (Week 1): Pt will perform stand pivot transfers using RW with MinA for posterior bias. PT Short Term Goal 4 (Week 1): Pt will ambulate at least 30 ft using RW with MinA. Week 2:  PT Short Term Goal 1 (Week 2): Pt will transfer sit to stand consistently w/supervision to LRAD PT Short Term Goal 2 (Week 2): Pt will perform basic transfers w/RW and supervision safely PT Short Term Goal 3 (Week 2): Pt will ambulate 154f w/RW and cga PT Short Term Goal 4 (Week 2): Pt will perform functional household ambulation x 539fw/RW and cga PT Short Term Goal 5 (Week 2): Pt will maintain balance w/overhead reach and single UE support on RW w/cga and no lob  Skilled Therapeutic Interventions/Progress Updates:  Pt initially oob in wc.  Transported to gym for session.      FOR LE strengthening: Step ups on 3in step x 20 w/cues to drive thru heel for quad and glut isolation/alternating Les.  Balance: Standing w/RW x 3 min w/cga, no difficulty  Mobility Sit to stand from mult surfaces/mat/wc/armchair w/Rw and cga,mult times during session (>8X)  Gait [id number] w/occasional cues for upward gaze, upright posture, final trial pt demonstrated increased ant trunk lean w/fatigue, did not occur during first 2 trails.  Pt rested several min between gait efforts for recovery.  Backing w/RW x 4 steps x 3 w/cga, cues for upright but no post lob.  Repeated Sit to stand - 6X w/cga  Standing on foam w/RW alternating arm raises progressing to bilat armraises w/min assist, mild post tendency.  Pt transported to room.  Pt left oob in wc w/alarm belt set and needs in reach    Therapy Documentation Precautions:  Precautions Precautions: Fall Precaution Comments: watch BP, mild dizziness in standing, posterior bias in standing Restrictions Weight Bearing Restrictions: No Therapy/Group: Individual Therapy BarCallie FieldingT [place]/21/2022, 1:35 PM

## 2021-03-08 NOTE — Progress Notes (Signed)
Occupational Therapy TBI Note  Patient Details  Name: Angel [name] MRN: [id number] Date of Birth: 07/14/1950  Today's Date: 03/08/2021 OT Individual Time: 1050-1200 OT Individual Time Calculation (min): 70 min    Short Term Goals: Week 1:  OT Short Term Goal 1 (Week 1): Pt will consistently transfer with MIN A to toilet OT Short Term Goal 2 (Week 1): Pt will thread BLE into pants with AE PRN OT Short Term Goal 3 (Week 1): Pt will groom wiht set up OT Short Term Goal 4 (Week 1): Pt will don shirt wiht MIN A  Skilled Therapeutic Interventions/Progress Updates:    Pt received sitting in the w/c with no c/o pain, agreeable to OT session. Pt taken via w/c to the therapy gym. Stand step transfers from w/c to the mat with RW with CGA. Pt completed blocked practice sit <> stands with 3 lb medicine ball in 1 UE and the other UE pushing up from mat- addressing functional transfers, LE strengthening and cardiovascular endurance. Pt required CGA-min A as he fatigued and minimal cueing for body mechanics. 3x 5 repetitions. Bimanual functional reaching task in standing with strength training component using a 3 lb dumbbell, focusing on lateral lean, weight shift, and dynamic standing balance. Dynamic stepping activity performed to challenge dynamic standing balance and functional activity tolerance/endurance. Ended with UE circuit holding a 3 lb ball, challenging tricep, bicep, and deltoid strength. Pt returned to his room and was left sitting up in the w/c with all needs met, chair alarm set.   Therapy Documentation Precautions:  Precautions Precautions: Fall Precaution Comments: watch BP, mild dizziness in standing, posterior bias in standing Restrictions Weight Bearing Restrictions: No    Agitated Behavior Scale: TBI Observation Details Observation Environment: Pt room/gym Start of observation period - Date: 03/08/21 Start of observation period - Time: 1045 End of observation period - Date:  03/08/21 End of observation period - Time: 1200 Agitated Behavior Scale (DO NOT LEAVE BLANKS) Short attention span, easy distractibility, inability to concentrate: Absent Impulsive, impatient, low tolerance for pain or frustration: Absent Uncooperative, resistant to care, demanding: Absent Violent and/or threatening violence toward people or property: Absent Explosive and/or unpredictable anger: Absent Rocking, rubbing, moaning, or other self-stimulating behavior: Absent Pulling at tubes, restraints, etc.: Absent Wandering from treatment areas: Absent Restlessness, pacing, excessive movement: Absent Repetitive behaviors, motor, and/or verbal: Absent Rapid, loud, or excessive talking: Absent Sudden changes of mood: Absent Easily initiated or excessive crying and/or laughter: Absent Self-abusiveness, physical and/or verbal: Absent Agitated behavior scale total score: 14    Therapy/Group: Individual Therapy  Curtis Sites 03/08/2021, 11:32 AM

## 2021-03-08 NOTE — Progress Notes (Signed)
Speech Language Pathology Weekly Progress and Session Note  Patient Details  Name: Angel [name] MRN: [id number] Date of Birth: May 12, 1951  Beginning of progress report period: March 02, 2021 End of progress report period: March 08, 2021  Today's Date: 03/08/2021 SLP Individual Time: 5423-7023 SLP Individual Time Calculation (min): 45 min  Short Term Goals: Week 1: SLP Short Term Goal 1 (Week 1): Pt will participate in further cognitive assessment (CLQT, AFLA, etc.) to further direct POC SLP Short Term Goal 1 - Progress (Week 1): Not met SLP Short Term Goal 2 (Week 1): Pt will increase recall of novel information with 90% accuracy provided min A SLP Short Term Goal 2 - Progress (Week 1): Met SLP Short Term Goal 3 (Week 1): Pt will perform alternating attention tasks provided mod A for use of strategies as trained SLP Short Term Goal 3 - Progress (Week 1): Met SLP Short Term Goal 4 (Week 1): Pt will complete mildly complex problem solving tasks with 90% accuracy provided min A SLP Short Term Goal 4 - Progress (Week 1): Met SLP Short Term Goal 5 (Week 1): Pt will ID 3 cognitive and physical changes as a result of TBI and hospitalization. SLP Short Term Goal 5 - Progress (Week 1): Met SLP Short Term Goal 6 (Week 1): Pt will complete med management task with 90% accuracy provided min A SLP Short Term Goal 6 - Progress (Week 1): Met    New Short Term Goals: Week 2: SLP Short Term Goal 1 (Week 2): STGs=LTGS due to ELOS  Weekly Progress Updates: Patient has made excellent gains and has met 5 of 6 STGs this reporting period. Overall, patient demonstrates behaviors consistent with a [place]o Level VII and requires overall Min A multimodal cues to complete functional and mildly complex tasks safely in regards to attention, problem solving, recall and awareness. Patient and family education ongoing. Patient would benefit from continued skilled SLP intervention to maximize his cognitive functioning  and overall functional independence prior to discharge.      Intensity: Minumum of 1-2 x/day, 30 to 90 minutes Frequency: 3 to 5 out of 7 days Duration/Length of Stay: 03/15/21 Treatment/Interventions: Cognitive remediation/compensation;Internal/external aids;Therapeutic Activities;Therapeutic Exercise;Patient/family education;Functional tasks;Cueing hierarchy;Environmental controls   Daily Session  Skilled Therapeutic Interventions:  Skilled treatment session focused on cognitive goals. SLP facilitated session by providing Min A verbal cues for problem solving while donning a pair of pants from the wheelchair. Patient participated in a complex calendar/appointment making task and required Min verbal cues for problem solving and error awareness. Patient demonstrated selective attention to task for ~30 minutes with Mod I. Patient left upright in bed with alarm on and all needs within reach. Continue with current plan of care.      Pain No/Denies Pain   Therapy/Group: Individual Therapy  Jamarie Mussa 03/08/2021, 6:15 AM

## 2021-03-08 NOTE — Progress Notes (Signed)
Occupational Therapy TBI Note  Patient Details  Name: Angel [name] MRN: [id number] Date of Birth: 1950-12-07  Today's Date: 03/08/2021 OT Individual Time: 1501-1530 OT Individual Time Calculation (min): 29 min    Short Term Goals: Week 1:  OT Short Term Goal 1 (Week 1): Pt will consistently transfer with MIN A to toilet OT Short Term Goal 1 - Progress (Week 1): Met OT Short Term Goal 2 (Week 1): Pt will thread BLE into pants with AE PRN OT Short Term Goal 2 - Progress (Week 1): Met OT Short Term Goal 3 (Week 1): Pt will groom wiht set up OT Short Term Goal 3 - Progress (Week 1): Met OT Short Term Goal 4 (Week 1): Pt will don shirt wiht MIN A  Skilled Therapeutic Interventions/Progress Updates:  Pt greeted  seated in w/c  agreeable to OT intervention. Session focus on  various therapeutic activities fucused on [facility] and sitting/ standing dynamic balance. Pt transported to therapy gym with total A. Utilized BITS to work on dynamic standing balance in conjunction with assess R vs L UE coordination. Pt sit<>stand from w/c with rw with MIN A. Attempted dynamic reaching trial with RUE with pt able to stand for 1 min and 42 secs but then reports fatigue needing to sit. Pt completed trial from sitting with RUE only. Pt completed task with 49.23 % accuracy in 1 min and 20 secs with a 2.47 sec reaction time. Pt reports he started to notice tremors in his RUE prior to the accident but now the tremors seem to be worse, especially with reaching as accuracy appeared to be difficult. Pt completed next trial from sitting with LUE with pt completing task with a 56.36% accuracy for 56 secs with a 1.71 sec reaction time. Of note pt did seem to be frustrated with this activity reporting "I dont like this game."    Remainder of session to focus on R/L UE [place]. Pt completed bilateral integration task where pt instructed to duplicate lego figure from visual aid provided to facilitate improved [facility] for higher level ADL  tasks.  Pt completed task with MOD A as pt noted to again become frustrated with task noted to give up. Pt reports the task wasn't "hard just frustrating." Pt transported back to room with total A where pt left seated in w/c with all needs within reach and alarm belt activated.            Therapy Documentation Precautions:  Precautions Precautions: Fall Precaution Comments: watch BP, mild dizziness in standing, posterior bias in standing Restrictions Weight Bearing Restrictions: No   Pain: Pain Assessment Pain Score: 2  Faces Pain Scale: No hurt Agitated Behavior Scale: TBI Observation Details Observation Environment: patient room Start of observation period - Date: 03/08/21 Start of observation period - Time: 1501 End of observation period - Date: 03/08/21 End of observation period - Time: 1530 Agitated Behavior Scale (DO NOT LEAVE BLANKS) Short attention span, easy distractibility, inability to concentrate: Absent Impulsive, impatient, low tolerance for pain or frustration: Present to a slight degree Uncooperative, resistant to care, demanding: Absent Violent and/or threatening violence toward people or property: Absent Explosive and/or unpredictable anger: Absent Rocking, rubbing, moaning, or other self-stimulating behavior: Absent Pulling at tubes, restraints, etc.: Absent Wandering from treatment areas: Absent Restlessness, pacing, excessive movement: Absent Repetitive behaviors, motor, and/or verbal: Absent Rapid, loud, or excessive talking: Absent Sudden changes of mood: Absent Easily initiated or excessive crying and/or laughter: Absent Self-abusiveness, physical and/or verbal: Absent  Agitated behavior scale total score: 15      Therapy/Group: Individual Therapy  Precious Haws 03/08/2021, 3:54 PM

## 2021-03-08 NOTE — Progress Notes (Signed)
[name] PHYSICAL MEDICINE & REHABILITATION PROGRESS NOTE  Subjective/Complaints: Had a reasonable night. About to get up to bathroom. Denies any new pain. Feels that strength is improving  ROS: Patient denies fever, rash, sore throat, blurred vision, nausea, vomiting, diarrhea, cough, shortness of breath or chest pain, joint or back pain, headache, or mood change.   Objective: Vital Signs: Blood pressure 122/75, pulse (!) 59, temperature 98 F (36.7 C), resp. rate 16, height 5\' 8"  (1.727 m), weight 113.9 kg, SpO2 100 %. No results found. Recent Labs    03/07/21 0507  WBC 5.4  HGB 8.4*  HCT 26.4*  PLT 245   No results for input(s): NA, K, CL, CO2, GLUCOSE, BUN, CREATININE, CALCIUM in the last 72 hours.   Intake/Output Summary (Last 24 hours) at 03/08/2021 0913 Last data filed at 03/08/2021 0640 Gross per 24 hour  Intake 480 ml  Output 450 ml  Net 30 ml        Physical Exam: BP 122/75 (BP Location: Right Arm)   Pulse (!) 59   Temp 98 F (36.7 C)   Resp 16   Ht 5\' 8"  (1.727 m)   Wt 113.9 kg   SpO2 100%   BMI 38.16 kg/m  Constitutional: No distress . Vital signs reviewed. HEENT: NCAT, EOMI, oral membranes moist Neck: supple Cardiovascular: RRR without murmur. No JVD    Respiratory/Chest: CTA Bilaterally without wheezes or rales. Normal effort    GI/Abdomen: BS +, non-tender, non-distended Ext: no clubbing, cyanosis, or edema Psych: pleasant and cooperative  Skin: small area of hypergranulation still on stoma site. A few abrasions Neuro: Alert and oriented to person, place, date, reason   Improved insight and awareness.   Follows all simple commands.   Motor: 5/5 in BUE , 3->4/5 in BLE prox to distal  Assessment/Plan: 1. Functional deficits which require 3+ hours per day of interdisciplinary therapy in a comprehensive inpatient rehab setting. Physiatrist is providing close team supervision and 24 hour management of active medical problems listed  below. Physiatrist and rehab team continue to assess barriers to discharge/monitor patient progress toward functional and medical goals   Care Tool:  Bathing    Body parts bathed by patient: Right arm, Chest, Abdomen, Front perineal area, Right upper leg, Left upper leg, Face   Body parts bathed by helper: Left arm, Buttocks, Right lower leg, Left lower leg     Bathing assist Assist Level: Moderate Assistance - Patient 50 - 74%     Upper Body Dressing/Undressing Upper body dressing   What is the patient wearing?: Pull over shirt    Upper body assist Assist Level: Moderate Assistance - Patient 50 - 74%    Lower Body Dressing/Undressing Lower body dressing      What is the patient wearing?: Pants, Incontinence brief     Lower body assist Assist for lower body dressing: Total Assistance - Patient < 25%     Toileting Toileting    Toileting assist Assist for toileting: Total Assistance - Patient < 25%     Transfers Chair/bed transfer  Transfers assist     Chair/bed transfer assist level: Minimal Assistance - Patient > 75% Chair/bed transfer assistive device: Walker, Clinical biochemist   Ambulation assist      Assist level: Minimal Assistance - Patient > 75% Assistive device: Walker-rolling Max distance: 160ft   Walk 10 feet activity   Assist  Walk 10 feet activity did not occur: Safety/medical concerns  Assist level: Moderate Assistance -  Patient - 50 - 74% Assistive device: Walker-rolling   Walk 50 feet activity   Assist Walk 50 feet with 2 turns activity did not occur: Safety/medical concerns         Walk 150 feet activity   Assist Walk 150 feet activity did not occur: Safety/medical concerns         Walk 10 feet on uneven surface  activity   Assist Walk 10 feet on uneven surfaces activity did not occur: Safety/medical concerns         Wheelchair     Assist Is the patient using a wheelchair?: Yes Type of  Wheelchair: Manual    Wheelchair assist level: Total Assistance - Patient < 25% Max wheelchair distance: >200 ft    Wheelchair 50 feet with 2 turns activity    Assist        Assist Level: Total Assistance - Patient < 25%   Wheelchair 150 feet activity     Assist      Assist Level: Total Assistance - Patient < 25%  BP 122/75 (BP Location: Right Arm)   Pulse (!) 59   Temp 98 F (36.7 C)   Resp 16   Ht 5\' 8"  (1.727 m)   Wt 113.9 kg   SpO2 100%   BMI 38.16 kg/m   Medical Problem List and Plan: 1.  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  Antithrombotics: -DVT/anticoagulation:  Pharmaceutical: Other (comment)/Eliquis for pulmonary emboli             -antiplatelet therapy: N/A 3. Pain Management: Neurontin 300 mg twice daily, Robaxin 1000 mg every 8 hours, oxycodone as needed             Monitor for headaches with increased exertion  Controlled with meds on 10/21 4. Mood: Xanax 0.25 mg twice daily as needed             -antipsychotic agents: Seroquel 25 mg nightly 5. Neuropsych: This patient is not capable of making decisions on his own behalf. 6. Skin/Wound Care: will apply silver nitrate to trach stoma again today 10/21 7. Fluids/Electrolytes/Nutrition: Routine in and outs              -eating 100% 8.  VDRF.  Status post tracheostomy tube 01/14/2021.  Decannulated 02/26/2021  9.  Atrial fibrillation with RVR.  Follow-up cardiology services.  Amiodarone 200 mg daily, Lopressor 12.5 mg twice daily.               Monitor with increased activity  Controlled on 10/21 10.  Diabetes mellitus with hyperglycemia.  Currently on Semglee 16 units twice daily.  Check blood sugars before meals and at bedtime.  Patient on Glucotrol 2.5 mg daily, Tradjenta 5 mg daily, Glucophage 1000 mg twice daily, Actos 45 mg daily prior to admission.   Resume oral agents as possible   CBG (last 3)  Recent Labs    03/07/21 1645 03/07/21 2126 03/08/21 0556  GLUCAP 214* 115* 92    10/19 resumed glucotrol     10/20 resumed actos 15mg  daily---observe cbg's today              11.  Acute blood loss anemia.  Continue iron supplement.               Hemoglobin 8.4 on 10/13, 8.3 on 10/17,   hx hemorrhoids only one  episode of overt blood in stool  -still awaiting stool sample for OB - hgb stable at 8.4 10/20-->recheck Monday  12.  Obesity.  BMI 32.74.  Dietary follow-up 13.  Slow transit constipation.  .  Bowel meds increased on 10/15  Last BM 10/19--sorbitol if no bm today 14.?  OSA  Continue [place] nightly 15.  Hx of right middle ear hemorrhage, temporal and occipital fx-  -cipro ear gtts     LOS: 7 days A FACE TO FACE EVALUATION WAS PERFORMED  Meredith Staggers 03/08/2021, 9:13 AM

## 2021-03-08 NOTE — Progress Notes (Signed)
Occupational Therapy Weekly Progress Note  Patient Details  Name: Angel [name] MRN: [id number] Date of Birth: 09/18/1950  Beginning of progress report period: March 02, 2021 End of progress report period: March 08, 2021  Patient has met 3 of 3 short term goals.  Angel [name] has made excellent progress toward his OT goals. He is able to transfer consistently at min A-CGA level. He is incredibly motivated and participates fully in all sessions. He is able to bathe/dress UB with supervision and min A for LB. Family edu has not taken place yet but is being scheduled.   Patient continues to demonstrate the following deficits: muscle weakness, decreased cardiorespiratoy endurance, decreased problem solving and decreased memory, and decreased standing balance and decreased postural control and therefore will continue to benefit from skilled OT intervention to enhance overall performance with BADL and iADL.  Patient progressing toward long term goals..  Continue plan of care.  OT Short Term Goals Week 1:  OT Short Term Goal 1 (Week 1): Pt will consistently transfer with MIN A to toilet OT Short Term Goal 1 - Progress (Week 1): Met OT Short Term Goal 2 (Week 1): Pt will thread BLE into pants with AE PRN OT Short Term Goal 2 - Progress (Week 1): Met OT Short Term Goal 3 (Week 1): Pt will groom wiht set up OT Short Term Goal 3 - Progress (Week 1): Met OT Short Term Goal 4 (Week 1): Pt will don shirt wiht MIN A Week 2:  OT Short Term Goal 1 (Week 2): STG= LTG d/t ELOS    H  03/08/2021, 11:49 AM  

## 2021-03-09 DIAGNOSIS — I609 Nontraumatic subarachnoid hemorrhage, unspecified: Secondary | ICD-10-CM | POA: Diagnosis not present

## 2021-03-09 DIAGNOSIS — I4891 Unspecified atrial fibrillation: Secondary | ICD-10-CM | POA: Diagnosis not present

## 2021-03-09 DIAGNOSIS — I2699 Other pulmonary embolism without acute cor pulmonale: Secondary | ICD-10-CM | POA: Diagnosis not present

## 2021-03-09 DIAGNOSIS — S069X0S Unspecified intracranial injury without loss of consciousness, sequela: Secondary | ICD-10-CM | POA: Diagnosis not present

## 2021-03-09 DIAGNOSIS — I10 Essential (primary) hypertension: Secondary | ICD-10-CM

## 2021-03-09 LAB — GLUCOSE, CAPILLARY
Glucose-Capillary: 69 mg/dL — ABNORMAL LOW (ref 70–99)
Glucose-Capillary: 85 mg/dL (ref 70–99)
Glucose-Capillary: 155 mg/dL — ABNORMAL HIGH (ref 70–99)
Glucose-Capillary: 256 mg/dL — ABNORMAL HIGH (ref 70–99)
Glucose-Capillary: 235 mg/dL — ABNORMAL HIGH (ref 70–99)
Glucose-Capillary: 195 mg/dL — ABNORMAL HIGH (ref 70–99)

## 2021-03-09 MED ORDER — INSULIN GLARGINE-YFGN 100 UNIT/ML ~~LOC~~ SOLN
17.0000 [IU] | Freq: Two times a day (BID) | SUBCUTANEOUS | Status: DC
  Administered 2021-03-10 – 2021-03-11 (×3): 17 [IU] via SUBCUTANEOUS
  Filled 2021-03-09 (×4): qty 0.17

## 2021-03-09 NOTE — Progress Notes (Signed)
[place] PHYSICAL MEDICINE & REHABILITATION PROGRESS NOTE  Subjective/Complaints: No complaints  ROS: Patient denies fever, rash, sore throat, blurred vision, nausea, vomiting, diarrhea, cough, shortness of breath or chest pain, joint or back pain, headache, or mood change.   Objective: Vital Signs: Blood pressure (!) 143/69, pulse 67, temperature 98.2 F (36.8 C), resp. rate 16, height 5\' 8"  (1.727 m), weight 115.6 kg, SpO2 99 %. No results found. Recent Labs    03/07/21 0507  WBC 5.4  HGB 8.4*  HCT 26.4*  PLT 245   No results for input(s): NA, K, CL, CO2, GLUCOSE, BUN, CREATININE, CALCIUM in the last 72 hours.   Intake/Output Summary (Last 24 hours) at 03/09/2021 2017 Last data filed at 03/09/2021 1829 Gross per 24 hour  Intake 720 ml  Output --  Net 720 ml        Physical Exam: BP (!) 143/69 (BP Location: Left Arm)   Pulse 67   Temp 98.2 F (36.8 C)   Resp 16   Ht 5\' 8"  (1.727 m)   Wt 115.6 kg   SpO2 99%   BMI 38.75 kg/m  Gen: no distress, normal appearing HEENT: oral mucosa pink and moist, NCAT Cardio: Reg rate Chest: normal effort, normal rate of breathing Abd: soft, non-distended Ext: no edema  Psych: pleasant and cooperative  Skin: small area of hypergranulation still on stoma site. A few abrasions Neuro: Alert and oriented to person, place, date, reason   Improved insight and awareness.   Follows all simple commands.   Motor: 5/5 in BUE , 3->4/5 in BLE prox to distal  Assessment/Plan: 1. Functional deficits which require 3+ hours per day of interdisciplinary therapy in a comprehensive inpatient rehab setting. Physiatrist is providing close team supervision and 24 hour management of active medical problems listed below. Physiatrist and rehab team continue to assess barriers to discharge/monitor patient progress toward functional and medical goals   Care Tool:  Bathing    Body parts bathed by patient: Right arm, Chest, Abdomen, Front  perineal area, Right upper leg, Left upper leg, Face   Body parts bathed by helper: Left arm, Buttocks, Right lower leg, Left lower leg     Bathing assist Assist Level: Moderate Assistance - Patient 50 - 74%     Upper Body Dressing/Undressing Upper body dressing   What is the patient wearing?: Pull over shirt    Upper body assist Assist Level: Moderate Assistance - Patient 50 - 74%    Lower Body Dressing/Undressing Lower body dressing      What is the patient wearing?: Pants, Incontinence brief     Lower body assist Assist for lower body dressing: Total Assistance - Patient < 25%     Toileting Toileting    Toileting assist Assist for toileting: Total Assistance - Patient < 25%     Transfers Chair/bed transfer  Transfers assist     Chair/bed transfer assist level: Minimal Assistance - Patient > 75% Chair/bed transfer assistive device: Walker, Clinical biochemist   Ambulation assist      Assist level: Minimal Assistance - Patient > 75% Assistive device: Walker-rolling Max distance: 159ft   Walk 10 feet activity   Assist  Walk 10 feet activity did not occur: Safety/medical concerns  Assist level: Moderate Assistance - Patient - 50 - 74% Assistive device: Walker-rolling   Walk 50 feet activity   Assist Walk 50 feet with 2 turns activity did not occur: Safety/medical concerns  Walk 150 feet activity   Assist Walk 150 feet activity did not occur: Safety/medical concerns         Walk 10 feet on uneven surface  activity   Assist Walk 10 feet on uneven surfaces activity did not occur: Safety/medical concerns         Wheelchair     Assist Is the patient using a wheelchair?: Yes Type of Wheelchair: Manual    Wheelchair assist level: Total Assistance - Patient < 25% Max wheelchair distance: >200 ft    Wheelchair 50 feet with 2 turns activity    Assist        Assist Level: Total Assistance - Patient  < 25%   Wheelchair 150 feet activity     Assist      Assist Level: Total Assistance - Patient < 25%  BP (!) 143/69 (BP Location: Left Arm)   Pulse 67   Temp 98.2 F (36.8 C)   Resp 16   Ht 5\' 8"  (1.727 m)   Wt 115.6 kg   SpO2 99%   BMI 38.75 kg/m   Medical Problem List and Plan: 1.  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  Antithrombotics: -DVT/anticoagulation:  Pharmaceutical: Other (comment)/Eliquis for pulmonary emboli             -antiplatelet therapy: N/A 3. Pain: continue Neurontin 300 mg twice daily, Robaxin 1000 mg every 8 hours, oxycodone as needed             Monitor for headaches with increased exertion  Controlled with meds on 10/22 4. Mood: Xanax 0.25 mg twice daily as needed             -antipsychotic agents: Seroquel 25 mg nightly 5. Neuropsych: This patient is not capable of making decisions on his own behalf. 6. Skin/Wound Care: will apply silver nitrate to trach stoma again 10/21 7. Fluids/Electrolytes/Nutrition: Routine in and outs              -eating 100% 8.  VDRF.  Status post tracheostomy tube 01/14/2021.  Decannulated 02/26/2021  9.  Atrial fibrillation with RVR.  Follow-up cardiology services.  Continue Amiodarone 200 mg daily, Lopressor 12.5 mg twice daily.               Monitor with increased activity  Controlled on 10/22 10.  Diabetes mellitus with hyperglycemia.  Currently on Semglee 16 units twice daily.  Check blood sugars before meals and at bedtime.  Patient on Glucotrol 2.5 mg daily, Tradjenta 5 mg daily, Glucophage 1000 mg twice daily, Actos 45 mg daily prior to admission.  Resume oral agents as possible   CBG (last 3)  Recent Labs    03/09/21 1134 03/09/21 1658 03/09/21 1800  GLUCAP 155* 256* 235*    10/19 resumed glucotrol     10/20 resumed actos 15mg  daily---observe cbg's today              10/22: increase Semglee to 17U 11.  Acute blood loss anemia.  Continue iron supplement.               Hemoglobin 8.4 on 10/13, 8.3 on 10/17,   hx hemorrhoids only one episode of overt blood in stool  -still awaiting stool sample for OB - hgb stable at 8.4 10/20-->recheck Monday  12.  Obesity.  BMI 32.74.  Dietary follow-up 13.  Slow  transit constipation.  .  Bowel meds increased on 10/15  Last BM 10/19--sorbitol if no bm today 14.?  OSA  Continue [place] nightly 15.  Hx of right middle ear hemorrhage, temporal and occipital fx-  -cipro ear gtts     LOS: 8 days A FACE TO FACE EVALUATION WAS PERFORMED  Clide Deutscher Devika Dragovich 03/09/2021, 8:17 PM

## 2021-03-09 NOTE — Progress Notes (Signed)
Occupational Therapy TBI Note  Patient Details  Name: Angel [name] MRN: [id number] Date of Birth: 11-03-1950  Today's Date: 03/09/2021 OT Individual Time: 1007-1037 OT Individual Time Calculation (min): 30 min    Short Term Goals: Week 2:  OT Short Term Goal 1 (Week 2): STG= LTG d/t ELOS  Skilled Therapeutic Interventions/Progress Updates:    Pt in wheelchair to start session with work on sit to stand and standing balance.  Min assist for completion of sit to stand without UE use as well as min guard without use of his UEs.  Once standing, he needed min assist for balance while completing head turns left to right secondary to posterior LOB.  Completed 2-3 intervals of standing endurance for 1-2 mins before needing rest breaks.  HR in the upper 60's with O2 at 95% on room air.  Finished session with pt in the wheelchair with the call button and phone in reach and safety belt in place.    Therapy Documentation Precautions:  Precautions Precautions: Fall Precaution Comments: watch BP, mild dizziness in standing, posterior bias in standing Restrictions Weight Bearing Restrictions: No   Pain: Pain Assessment Pain Scale: Faces Pain Score: 0-No pain Faces Pain Scale: No hurt Agitated Behavior Scale: TBI Observation Details Observation Environment: pt room Start of observation period - Date: 03/09/21 Start of observation period - Time: 1007 End of observation period - Date: 03/09/21 End of observation period - Time: 1037 Agitated Behavior Scale (DO NOT LEAVE BLANKS) Short attention span, easy distractibility, inability to concentrate: Absent Impulsive, impatient, low tolerance for pain or frustration: Absent Uncooperative, resistant to care, demanding: Absent Violent and/or threatening violence toward people or property: Absent Explosive and/or unpredictable anger: Absent Rocking, rubbing, moaning, or other self-stimulating behavior: Absent Pulling at tubes, restraints, etc.:  Absent Wandering from treatment areas: Absent Restlessness, pacing, excessive movement: Absent Repetitive behaviors, motor, and/or verbal: Absent Rapid, loud, or excessive talking: Absent Sudden changes of mood: Absent Easily initiated or excessive crying and/or laughter: Absent Self-abusiveness, physical and/or verbal: Absent Agitated behavior scale total score: 14  ADL: See Care Tool Section for some details of mobility and selfcare   Therapy/Group: Individual Therapy  Tammee Thielke OTR/L 03/09/2021, 12:23 PM

## 2021-03-09 NOTE — Progress Notes (Signed)
Speech Language Pathology TBI Note  Patient Details  Name: Angel [name] MRN: [id number] Date of Birth: [birth year]  Today's Date: 03/09/2021 SLP Individual Time: 4098-1191 SLP Individual Time Calculation (min): 45 min  Short Term Goals: Week 2: SLP Short Term Goal 1 (Week 2): STGs=LTGS due to ELOS  Skilled Therapeutic Interventions:  Pt was seen for skilled ST targeting cognitive goals.  Upon arrival, pt was sitting upright in wheelchair, awake, alert, and agreeable to participating in treatment.  SLP facilitated the session with a mildly complex deductive reasoning puzzle to address problem solving.  Pt needed subtle supervision verbal cues for task organization and error awareness to complete task.  Pt verbalizes good understanding of his current physical limitations and describes good family support for post discharge. Pt was left in his wheelchair with chair alarm set and all needs within reach. Continue per current plan of care.    Pain Pain Assessment Pain Scale: 0-10 Pain Score: 0-No pain Faces Pain Scale: No hurt  Agitated Behavior Scale: TBI Observation Details Observation Environment: pt room Start of observation period - Date: 03/09/21 Start of observation period - Time: 0845 End of observation period - Date: 03/09/21 End of observation period - Time: 0930 Agitated Behavior Scale (DO NOT LEAVE BLANKS) Short attention span, easy distractibility, inability to concentrate: Absent Impulsive, impatient, low tolerance for pain or frustration: Absent Uncooperative, resistant to care, demanding: Absent Violent and/or threatening violence toward people or property: Absent Explosive and/or unpredictable anger: Absent Rocking, rubbing, moaning, or other self-stimulating behavior: Absent Pulling at tubes, restraints, etc.: Absent Wandering from treatment areas: Absent Restlessness, pacing, excessive movement: Absent Repetitive behaviors, motor, and/or verbal: Absent Rapid, loud,  or excessive talking: Absent Sudden changes of mood: Absent Easily initiated or excessive crying and/or laughter: Absent Self-abusiveness, physical and/or verbal: Absent Agitated behavior scale total score: 14  Therapy/Group: Individual Therapy  Angel [name], Selinda Orion 03/09/2021, 11:55 AM

## 2021-03-10 DIAGNOSIS — I609 Nontraumatic subarachnoid hemorrhage, unspecified: Secondary | ICD-10-CM | POA: Diagnosis not present

## 2021-03-10 DIAGNOSIS — I2699 Other pulmonary embolism without acute cor pulmonale: Secondary | ICD-10-CM | POA: Diagnosis not present

## 2021-03-10 DIAGNOSIS — S069X0S Unspecified intracranial injury without loss of consciousness, sequela: Secondary | ICD-10-CM | POA: Diagnosis not present

## 2021-03-10 DIAGNOSIS — I4891 Unspecified atrial fibrillation: Secondary | ICD-10-CM | POA: Diagnosis not present

## 2021-03-10 LAB — GLUCOSE, CAPILLARY
Glucose-Capillary: 71 mg/dL (ref 70–99)
Glucose-Capillary: 175 mg/dL — ABNORMAL HIGH (ref 70–99)
Glucose-Capillary: 242 mg/dL — ABNORMAL HIGH (ref 70–99)
Glucose-Capillary: 202 mg/dL — ABNORMAL HIGH (ref 70–99)

## 2021-03-10 NOTE — Progress Notes (Signed)
Physical Therapy TBI Note  Patient Details  Name: Angel [name] MRN: [id number] Date of Birth: [birth year]  Today's Date: 03/10/2021 PT Individual Time: 0800-0900 PT Individual Time Calculation (min): 60 min   Short Term Goals: Week 2:  PT Short Term Goal 1 (Week 2): Pt will transfer sit to stand consistently w/supervision to LRAD PT Short Term Goal 2 (Week 2): Pt will perform basic transfers w/RW and supervision safely PT Short Term Goal 3 (Week 2): Pt will ambulate 169ft w/RW and cga PT Short Term Goal 4 (Week 2): Pt will perform functional household ambulation x 82ft w/RW and cga PT Short Term Goal 5 (Week 2): Pt will maintain balance w/overhead reach and single UE support on RW w/cga and no lob  Skilled Therapeutic Interventions/Progress Updates:     Patient in w/c in the rom upon PT arrival. Patient alert and agreeable to PT session. Patient reported 2/10 low back pain during session. PT provided repositioning, rest breaks, and distraction as pain interventions throughout session.   Noted non-pitting B lower extremity edema when donning tennis shoes. Applied B TED hose to manage edema, RN made aware. Donned TEDs and tennis shoe with total A for time/energy management. Patient able to reach figure four in sitting to doff non-skids socks with supervision for min cues.  Educated on general TBI symptoms/recovery, lower extremity edea and family cardiac history (father with fatal heart attack at age 93), A-fib signs/symptoms, med management, and increased risk of stroke, POC, LOS, and d/c planning throughout session.   Patient reports R hand numbness/tingling along ulnar nerve distribution with intermittent tremor limiting functional use of R hand for feeding. Will assess further during next session.  Therapeutic Activity: Transfers: Patient performed sit to/from stand with x2 attempts without assist and x1 with min A for boosting up on first trial and supervision at end of session using RW  following NMR, see below.   Gait Training:  Patient ambulated ~120 feet x2 using RW with CGA x1 and supervision x1. Ambulated with decreased gait speed, decreased step length and height, increased B hip and knee flexion in stance, forward trunk lean, and downward head gaze. Provided verbal cues for erect posture, looking ahead, shoulder depression, increased step height, and increased step length on L.  Neuromuscular Re-ed: Patient performed the following functional activities focused on forward trunk lean and weight shift to reduce posterior bias and improve boosting up during sit to stand: -sit to stand 3x5 from progressively lower mat table heights with light use of RW for steadying support with CGA-supervision -standing without AD x4 focused on items above and righting reactions for increased postural control in standing -4 steps forwards/backwards without upper extremity support with min A for weight shift, limited by slight increased in low back pain without AD for support  Patient in w/c in the room at end of session with breaks locked, seat belt alarm set, and all needs within reach.   Therapy Documentation Precautions:  Precautions Precautions: Fall Precaution Comments: watch BP, mild dizziness in standing, posterior bias in standing Restrictions Weight Bearing Restrictions: No  Agitated Behavior Scale: TBI Observation Details Observation Environment: [address] Start of observation period - Date: 03/10/21 Start of observation period - Time: 0800 End of observation period - Date: 03/10/21 End of observation period - Time: 0900 Agitated Behavior Scale (DO NOT LEAVE BLANKS) Short attention span, easy distractibility, inability to concentrate: Absent Impulsive, impatient, low tolerance for pain or frustration: Absent Uncooperative, resistant to care, demanding: Absent Violent  and/or threatening violence toward people or property: Absent Explosive and/or unpredictable anger:  Absent Rocking, rubbing, moaning, or other self-stimulating behavior: Absent Pulling at tubes, restraints, etc.: Absent Wandering from treatment areas: Absent Restlessness, pacing, excessive movement: Absent Repetitive behaviors, motor, and/or verbal: Absent Rapid, loud, or excessive talking: Absent Sudden changes of mood: Absent Easily initiated or excessive crying and/or laughter: Present to a slight degree Self-abusiveness, physical and/or verbal: Absent Agitated behavior scale total score: 15    Therapy/Group: Individual Therapy  Shiara Mcgough L Donnabelle Blanchard PT, DPT  70/23/2022, 9:28 AM

## 2021-03-10 NOTE — Progress Notes (Signed)
[facility] PHYSICAL MEDICINE & REHABILITATION PROGRESS NOTE  Subjective/Complaints: Seen ambulating in hallway with Cherie- doing very well with RW, tolerated session well and received TBI education Bradycardic and hypotensive, other vitals stable  ROS: Patient denies fever, rash, sore throat, blurred vision, nausea, vomiting, diarrhea, cough, shortness of breath or chest pain, joint or back pain, headache, or mood change.   Objective: Vital Signs: Blood pressure (!) 104/53, pulse (!) 51, temperature 97.7 F (36.5 C), temperature source Oral, resp. rate 16, height 5\' 8"  (1.727 m), weight 115.6 kg, SpO2 90 %. No results found. No results for input(s): WBC, HGB, HCT, PLT in the last 72 hours.  No results for input(s): NA, K, CL, CO2, GLUCOSE, BUN, CREATININE, CALCIUM in the last 72 hours.   Intake/Output Summary (Last 24 hours) at 03/10/2021 1039 Last data filed at 03/10/2021 0940 Gross per 24 hour  Intake 1160 ml  Output --  Net 1160 ml        Physical Exam: BP (!) 104/53 (BP Location: Right Arm)   Pulse (!) 51   Temp 97.7 F (36.5 C) (Oral)   Resp 16   Ht 5\' 8"  (1.727 m)   Wt 115.6 kg   SpO2 90%   BMI 38.75 kg/m  Gen: no distress, normal appearing HEENT: oral mucosa pink and moist, NCAT Cardio: Bradycardic Chest: normal effort, normal rate of breathing Abd: soft, non-distended Ext: no edema  Psych: pleasant and cooperative  Skin: small area of hypergranulation still on stoma site. A few abrasions Neuro: Alert and oriented to person, place, date, reason   Improved insight and awareness.   Follows all simple commands.   Motor: 5/5 in BUE , 3->4/5 in BLE prox to distal  Assessment/Plan: 1. Functional deficits which require 3+ hours per day of interdisciplinary therapy in a comprehensive inpatient rehab setting. Physiatrist is providing close team supervision and 24 hour management of active medical problems listed below. Physiatrist and rehab team continue to  assess barriers to discharge/monitor patient progress toward functional and medical goals   Care Tool:  Bathing    Body parts bathed by patient: Right arm, Chest, Abdomen, Front perineal area, Right upper leg, Left upper leg, Face   Body parts bathed by helper: Left arm, Buttocks, Right lower leg, Left lower leg     Bathing assist Assist Level: Moderate Assistance - Patient 50 - 74%     Upper Body Dressing/Undressing Upper body dressing   What is the patient wearing?: Pull over shirt    Upper body assist Assist Level: Moderate Assistance - Patient 50 - 74%    Lower Body Dressing/Undressing Lower body dressing      What is the patient wearing?: Pants, Incontinence brief     Lower body assist Assist for lower body dressing: Total Assistance - Patient < 25%     Toileting Toileting    Toileting assist Assist for toileting: Total Assistance - Patient < 25%     Transfers Chair/bed transfer  Transfers assist     Chair/bed transfer assist level: Contact Guard/Touching assist Chair/bed transfer assistive device: Programmer, multimedia   Ambulation assist      Assist level: Contact Guard/Touching assist Assistive device: Walker-rolling Max distance: 120 ft   Walk 10 feet activity   Assist  Walk 10 feet activity did not occur: Safety/medical concerns  Assist level: Contact Guard/Touching assist Assistive device: Walker-rolling   Walk 50 feet activity   Assist Walk 50 feet with 2 turns activity did not  occur: Safety/medical concerns  Assist level: Contact Guard/Touching assist Assistive device: Walker-rolling    Walk 150 feet activity   Assist Walk 150 feet activity did not occur: Safety/medical concerns         Walk 10 feet on uneven surface  activity   Assist Walk 10 feet on uneven surfaces activity did not occur: Safety/medical concerns         Wheelchair     Assist Is the patient using a wheelchair?: Yes Type of  Wheelchair: Manual    Wheelchair assist level: Total Assistance - Patient < 25% Max wheelchair distance: >200 ft    Wheelchair 50 feet with 2 turns activity    Assist        Assist Level: Total Assistance - Patient < 25%   Wheelchair 150 feet activity     Assist      Assist Level: Total Assistance - Patient < 25%  BP (!) 104/53 (BP Location: Right Arm)   Pulse (!) 51   Temp 97.7 F (36.5 C) (Oral)   Resp 16   Ht 5\' 8"  (1.727 m)   Wt 115.6 kg   SpO2 90%   BMI 38.75 kg/m   Medical Problem List and Plan: 1.  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  Antithrombotics: -DVT/anticoagulation:  Pharmaceutical: Other (comment)/Eliquis for pulmonary emboli             -antiplatelet therapy: N/A 3. Pain: continue Neurontin 300 mg twice daily, Robaxin 1000 mg every 8 hours, oxycodone as needed             Monitor for headaches with increased exertion  Controlled with meds on 10/23 4. Anxiety: continue Xanax 0.25 mg twice daily as needed             -antipsychotic agents: Seroquel 25 mg nightly 5. Neuropsych: This patient is not capable of making decisions on his own behalf. 6. Skin/Wound Care: will apply silver nitrate to trach stoma again 10/21 7. Fluids/Electrolytes/Nutrition: Routine in and outs              -eating 100% 8.  VDRF.  Status post tracheostomy tube 01/14/2021.  Decannulated 02/26/2021  9.  Atrial fibrillation with RVR.  Follow-up cardiology services.  Continue Amiodarone 200 mg daily, Lopressor 12.5 mg twice daily.               Monitor with increased activity  Controlled on 10/22 10.  Diabetes mellitus with hyperglycemia.  Currently on Semglee 16 units twice daily.  Check blood sugars before meals and at bedtime.  Patient on Glucotrol 2.5 mg daily, Tradjenta 5 mg daily, Glucophage 1000 mg twice daily, Actos 45 mg  daily prior to admission.  Resume oral agents as possible   CBG (last 3)  Recent Labs    03/09/21 1800 03/09/21 2114 03/10/21 0602  GLUCAP 235* 195* 71    10/19 resumed glucotrol     10/20 resumed actos 15mg  daily---observe cbg's today             10/22: increase Semglee to 17U  10/23: CBGs ranging from 71 to 235: maintain current dose 11.  Acute blood loss anemia.  Continue iron supplement.               Hemoglobin 8.4 on 10/13, 8.3 on 10/17,   hx hemorrhoids only one episode of overt blood  in stool  -still awaiting stool sample for OB - hgb stable at 8.4 10/20-->recheck Monday  12.  Obesity.  BMI 32.74.  Dietary follow-up 13.  Slow transit constipation.  .  Bowel meds increased on 10/15, received sorbitol 14.?  OSA  Continue [place] nightly 15.  Hx of right middle ear hemorrhage, temporal and occipital fx-  -cipro ear gtts     LOS: 9 days A FACE TO FACE EVALUATION WAS PERFORMED  Clide Deutscher Daisie Haft 03/10/2021, 10:39 AM

## 2021-03-11 DIAGNOSIS — I4891 Unspecified atrial fibrillation: Secondary | ICD-10-CM | POA: Diagnosis not present

## 2021-03-11 DIAGNOSIS — S069X0S Unspecified intracranial injury without loss of consciousness, sequela: Secondary | ICD-10-CM | POA: Diagnosis not present

## 2021-03-11 DIAGNOSIS — I609 Nontraumatic subarachnoid hemorrhage, unspecified: Secondary | ICD-10-CM | POA: Diagnosis not present

## 2021-03-11 DIAGNOSIS — I2699 Other pulmonary embolism without acute cor pulmonale: Secondary | ICD-10-CM | POA: Diagnosis not present

## 2021-03-11 LAB — BASIC METABOLIC PANEL
Anion gap: 9 (ref 5–15)
BUN: 21 mg/dL (ref 8–23)
CO2: 29 mmol/L (ref 22–32)
Calcium: 8.7 mg/dL — ABNORMAL LOW (ref 8.9–10.3)
Chloride: 101 mmol/L (ref 98–111)
Creatinine, Ser: 1.15 mg/dL (ref 0.61–1.24)
GFR, Estimated: >60 mL/min (ref >60)
Glucose, Bld: 73 mg/dL (ref 70–99)
Potassium: 3.6 mmol/L (ref 3.5–5.1)
Sodium: 139 mmol/L (ref 135–145)

## 2021-03-11 LAB — GLUCOSE, CAPILLARY
Glucose-Capillary: 75 mg/dL (ref 70–99)
Glucose-Capillary: 108 mg/dL — ABNORMAL HIGH (ref 70–99)
Glucose-Capillary: 202 mg/dL — ABNORMAL HIGH (ref 70–99)
Glucose-Capillary: 157 mg/dL — ABNORMAL HIGH (ref 70–99)

## 2021-03-11 LAB — CBC
HCT: 27.2 % — ABNORMAL LOW (ref 39.0–52.0)
Hemoglobin: 8.3 g/dL — ABNORMAL LOW (ref 13.0–17.0)
MCH: 28.5 pg (ref 26.0–34.0)
MCHC: 30.5 g/dL (ref 30.0–36.0)
MCV: 93.5 fL (ref 80.0–100.0)
Platelets: 212 10*3/uL (ref 150–400)
RBC: 2.91 MIL/uL — ABNORMAL LOW (ref 4.22–5.81)
RDW: 17.7 % — ABNORMAL HIGH (ref 11.5–15.5)
WBC: 4.8 10*3/uL (ref 4.0–10.5)
nRBC: 0 % (ref 0.0–0.2)

## 2021-03-11 MED ORDER — INSULIN GLARGINE-YFGN 100 UNIT/ML ~~LOC~~ SOLN
15.0000 [IU] | Freq: Two times a day (BID) | SUBCUTANEOUS | Status: DC
  Administered 2021-03-11 – 2021-03-13 (×4): 15 [IU] via SUBCUTANEOUS
  Filled 2021-03-11 (×5): qty 0.15

## 2021-03-11 MED ORDER — METFORMIN HCL 500 MG PO TABS
500.0000 mg | ORAL_TABLET | Freq: Every day | ORAL | Status: DC
  Administered 2021-03-12 – 2021-03-15 (×4): 500 mg via ORAL
  Filled 2021-03-11 (×4): qty 1

## 2021-03-11 NOTE — Progress Notes (Signed)
Speech Language Pathology TBI Note  Patient Details  Name: Angel [name] MRN: [id number] Date of Birth: Nov 10, 1950  Today's Date: 03/11/2021 SLP Individual Time: 1110-1135 SLP Individual Time Calculation (min): 25 min  Short Term Goals: Week 2: SLP Short Term Goal 1 (Week 2): STGs=LTGS due to ELOS  Skilled Therapeutic Interventions: Skilled treatment session focused on cognitive goals. SLP facilitated session by providing extra time and overall supervision level verbal cues for problem solving during a complex scheduling task. Patient demonstrated selective attention to task in a mildly distracting environment for 20 minutes independently. Patient handed off to OT. Continue with current plan of care.      Pain No/Denies Pain   Agitated Behavior Scale: TBI Observation Details Observation Environment: Patient's room Start of observation period - Date: 03/11/21 Start of observation period - Time: 1110 End of observation period - Date: 03/11/21 End of observation period - Time: 1135 Agitated Behavior Scale (DO NOT LEAVE BLANKS) Short attention span, easy distractibility, inability to concentrate: Absent Impulsive, impatient, low tolerance for pain or frustration: Absent Uncooperative, resistant to care, demanding: Absent Violent and/or threatening violence toward people or property: Absent Explosive and/or unpredictable anger: Absent Rocking, rubbing, moaning, or other self-stimulating behavior: Absent Pulling at tubes, restraints, etc.: Absent Wandering from treatment areas: Absent Restlessness, pacing, excessive movement: Absent Repetitive behaviors, motor, and/or verbal: Absent Rapid, loud, or excessive talking: Absent Sudden changes of mood: Absent Easily initiated or excessive crying and/or laughter: Absent Self-abusiveness, physical and/or verbal: Absent Agitated behavior scale total score: 14  Therapy/Group: Individual Therapy  [name] 03/11/2021, 12:10 PM

## 2021-03-11 NOTE — Progress Notes (Signed)
Physical Therapy TBI Note  Patient Details  Name: Angel [name] MRN: [id number] Date of Birth: 06-May-1951  Today's Date: 03/11/2021 PT Individual Time: 1300-1400 PT Individual Time Calculation (min): 60 min   Short Term Goals: Week 2:  PT Short Term Goal 1 (Week 2): Pt will transfer sit to stand consistently w/supervision to LRAD PT Short Term Goal 2 (Week 2): Pt will perform basic transfers w/RW and supervision safely PT Short Term Goal 3 (Week 2): Pt will ambulate 197ft w/RW and cga PT Short Term Goal 4 (Week 2): Pt will perform functional household ambulation x 40ft w/RW and cga PT Short Term Goal 5 (Week 2): Pt will maintain balance w/overhead reach and single UE support on RW w/cga and no lob  Skilled Therapeutic Interventions/Progress Updates:     Patient in w/c with his wife in the room upon PT arrival. Patient alert and agreeable to PT session. Patient reported 1-2/10 low back pain, baseline, during session. PT provided repositioning, rest breaks, and distraction as pain interventions throughout session.    Patient eager to get up to walk and spontaneously attempted to stand once RW was placed in front of him by PT. W/c breaks were not locked and the w/c slid back from underneath the patient requiring total A to prevent fall. Patient with insufficient lower extremity strength to stand safely from low squat position and slowly lowered to the floor for patient and PT safety. Patient denied pain or injury and stated "you lowered me like silk." Staff called in to assist with floor transfer to return patient to the w/c with +3 assist and cues for lower extremity placement for safe transfer. RN made aware of controlled fall and vitals checked and WNL. Patient endorsed no pain or injury throughout remainder of session. Utilized incident as Administrator, arts for safety awareness to manage impulsivity and ensure safety prior to any transfer to prevent falls. Patient and his wife appreciative of  education.    Therapeutic Activity: Transfers: Patient performed sit to/from stand x6 with supervision, progressing to min A with fatigue from mat table without AD x3 and with AD x3. Provided verbal cues for scooting forward and forward weight shift.   Gait Training:  Patient ambulated 85 feet and 180 feet using RW with supervision. Ambulated with decreased gait speed, decreased step length and height, increased B hip and knee flexion in stance, forward trunk lean, and downward head gaze. Provided verbal cues for erect posture, looking ahead, shoulder depression, increased step height, and increased step length on L.   Neuromuscular Re-ed: Patient performed the [facility] Test, see details below: Patient demonstrates increased fall risk as noted by score of 20/56 on Berg Balance Scale.  (<36= high risk for falls, close to 100%; 37-45 significant >80%; 46-51 moderate >50%; 52-55 lower >25%) Educated patient and his wife on results and interpretation and recommended use of RW with all standing and ambulatory activities with supervision for safety to prevent falls. Patient and his wife stated understanding.   Patient in w/c with is wife in the room at end of session with breaks locked, seat belt alarm set, and all needs within reach.  Therapy Documentation Precautions:  Precautions Precautions: Fall Precaution Comments: watch BP, mild dizziness in standing, posterior bias in standing Restrictions Weight Bearing Restrictions: No  Agitated Behavior Scale: TBI Observation Details Observation Environment: pts room/ortho gym Start of observation period - Date: 03/11/21 Start of observation period - Time: 1300 End of observation period - Date: 03/11/21 End  of observation period - Time: 1400 Agitated Behavior Scale (DO NOT LEAVE BLANKS) Short attention span, easy distractibility, inability to concentrate: Absent Impulsive, impatient, low tolerance for pain or frustration: Present to a slight  degree Uncooperative, resistant to care, demanding: Absent Violent and/or threatening violence toward people or property: Absent Explosive and/or unpredictable anger: Absent Rocking, rubbing, moaning, or other self-stimulating behavior: Absent Pulling at tubes, restraints, etc.: Absent Wandering from treatment areas: Absent Restlessness, pacing, excessive movement: Absent Repetitive behaviors, motor, and/or verbal: Absent Rapid, loud, or excessive talking: Absent Sudden changes of mood: Absent Easily initiated or excessive crying and/or laughter: Absent Self-abusiveness, physical and/or verbal: Absent Agitated behavior scale total score: 15  Balance: Standardized Balance Assessment Standardized Balance Assessment: Berg Balance Test Berg Balance Test Sit to Stand: Able to stand using hands after several tries Standing Unsupported: Able to stand 2 minutes with supervision Sitting with Back Unsupported but Feet Supported on Floor or Stool: Able to sit safely and securely 2 minutes Stand to Sit: Controls descent by using hands Transfers: Needs one person to assist Standing Unsupported with Eyes Closed: Able to stand 10 seconds with supervision Standing Ubsupported with Feet Together: Needs help to attain position but able to stand for 30 seconds with feet together From Standing, Reach Forward with Outstretched Arm: Loses balance while trying/requires external support From Standing Position, Pick up Object from Floor: Unable to try/needs assist to keep balance From Standing Position, Turn to Look Behind Over each Shoulder: Needs supervision when turning Turn 360 Degrees: Needs assistance while turning Standing Unsupported, Alternately Place Feet on Step/Stool: Needs assistance to keep from falling or unable to try Standing Unsupported, One Foot in Front: Needs help to step but can hold 15 seconds Standing on One Leg: Tries to lift leg/unable to hold 3 seconds but remains standing  independently Total Score: 20/56   Therapy/Group: Individual Therapy  Jesicca Dipierro L Elanda Garmany PT, DPT  03/11/2021, 3:33 PM

## 2021-03-11 NOTE — Progress Notes (Signed)
   03/11/21 1319  What Happened  Was fall witnessed? Yes  Who witnessed fall? Cherie PT  Patients activity before fall ambulating-assisted  Point of contact buttocks  Was patient injured? No  Follow Up  MD notified Dan PA  Time MD notified [phone]  Family notified Yes - comment (Family at bedside)  Time family notified 1315  Additional tests No  Simple treatment Other (comment) (N/A)  Progress note created (see row info) Yes  Adult Fall Risk Assessment  Risk Factor Category (scoring not indicated) Fall has occurred during this admission (document High fall risk)  Patient Fall Risk Level High fall risk  Adult Fall Risk Interventions  Required Bundle Interventions *See Row Information* High fall risk - low, moderate, and high requirements implemented  Screening for Fall Injury Risk (To be completed on HIGH fall risk patients) - Assessing Need for Floor Mats  Risk For Fall Injury- Criteria for Floor Mats Previous fall this admission  Will Implement Floor Mats Yes  Vitals  Temp 98.2 F (36.8 C)  Temp Source Oral  BP 117/65  MAP (mmHg) 81  BP Location Right Arm  BP Method Automatic  Patient Position (if appropriate) Sitting  Pulse Rate 76  Pulse Rate Source Monitor  Resp 19  Oxygen Therapy  SpO2 100 %  O2 Device Room Air  Pain Assessment  Pain Scale 0-10  Pain Score 0  Neurological  Neuro (WDL) WDL (No changes from previous assessment)  RUE Sensation Tingling  Musculoskeletal  Musculoskeletal (WDL) X (no changes from prior assessment)  Generalized Weakness Yes  Integumentary  Integumentary (WDL) WDL (no changes from prior assessment)

## 2021-03-11 NOTE — Progress Notes (Signed)
[facility] PHYSICAL MEDICINE & REHABILITATION PROGRESS NOTE  Subjective/Complaints: Wife at bedside discussed DM management   ROS: Patient denies CP, SOB, N/V/D  Objective: Vital Signs: Blood pressure 122/73, pulse (!) 59, temperature 97.8 F (36.6 C), resp. rate 16, height 5\' 8"  (1.727 m), weight 115.6 kg, SpO2 100 %. No results found. Recent Labs    03/11/21 0548  WBC 4.8  HGB 8.3*  HCT 27.2*  PLT 212    Recent Labs    03/11/21 0548  NA 139  K 3.6  CL 101  CO2 29  GLUCOSE 73  BUN 21  CREATININE 1.15  CALCIUM 8.7*     Intake/Output Summary (Last 24 hours) at 03/11/2021 1101 Last data filed at 03/11/2021 0810 Gross per 24 hour  Intake 653 ml  Output --  Net 653 ml         Physical Exam: BP 122/73 (BP Location: Right Arm)   Pulse (!) 59   Temp 97.8 F (36.6 C)   Resp 16   Ht 5\' 8"  (1.727 m)   Wt 115.6 kg   SpO2 100%   BMI 38.75 kg/m   General: No acute distress Mood and affect are appropriate Heart: Regular rate and rhythm no rubs murmurs or extra sounds Lungs: Clear to auscultation, breathing unlabored, no rales or wheezes Abdomen: Positive bowel sounds, soft nontender to palpation, nondistended Extremities: No clubbing, cyanosis, or edema Skin: No evidence of breakdown, no evidence of rash    Psych: pleasant and cooperative  Skin: small area of hypergranulation still on stoma site. A few abrasions Neuro: Alert and oriented to person, place, date, reason   Improved insight and awareness.   Follows all simple commands.   Motor: 5/5 in BUE , 3->4/5 in BLE prox to distal  Assessment/Plan: 1. Functional deficits which require 3+ hours per day of interdisciplinary therapy in a comprehensive inpatient rehab setting. Physiatrist is providing close team supervision and 24 hour management of active medical problems listed below. Physiatrist and rehab team continue to assess barriers to discharge/monitor patient progress toward functional and  medical goals   Care Tool:  Bathing    Body parts bathed by patient: Right arm, Chest, Abdomen, Front perineal area, Right upper leg, Left upper leg, Face   Body parts bathed by helper: Left arm, Buttocks, Right lower leg, Left lower leg     Bathing assist Assist Level: Moderate Assistance - Patient 50 - 74%     Upper Body Dressing/Undressing Upper body dressing   What is the patient wearing?: Pull over shirt    Upper body assist Assist Level: Moderate Assistance - Patient 50 - 74%    Lower Body Dressing/Undressing Lower body dressing      What is the patient wearing?: Pants, Incontinence brief     Lower body assist Assist for lower body dressing: Total Assistance - Patient < 25%     Toileting Toileting    Toileting assist Assist for toileting: Total Assistance - Patient < 25%     Transfers Chair/bed transfer  Transfers assist     Chair/bed transfer assist level: Contact Guard/Touching assist Chair/bed transfer assistive device: Programmer, multimedia   Ambulation assist      Assist level: Contact Guard/Touching assist Assistive device: Walker-rolling Max distance: 120 ft   Walk 10 feet activity   Assist  Walk 10 feet activity did not occur: Safety/medical concerns  Assist level: Contact Guard/Touching assist Assistive device: Walker-rolling   Walk 50 feet activity  Assist Walk 50 feet with 2 turns activity did not occur: Safety/medical concerns  Assist level: Contact Guard/Touching assist Assistive device: Walker-rolling    Walk 150 feet activity   Assist Walk 150 feet activity did not occur: Safety/medical concerns         Walk 10 feet on uneven surface  activity   Assist Walk 10 feet on uneven surfaces activity did not occur: Safety/medical concerns         Wheelchair     Assist Is the patient using a wheelchair?: Yes Type of Wheelchair: Manual    Wheelchair assist level: Total Assistance - Patient <  25% Max wheelchair distance: >200 ft    Wheelchair 50 feet with 2 turns activity    Assist        Assist Level: Total Assistance - Patient < 25%   Wheelchair 150 feet activity     Assist      Assist Level: Total Assistance - Patient < 25%  BP 122/73 (BP Location: Right Arm)   Pulse (!) 59   Temp 97.8 F (36.6 C)   Resp 16   Ht 5\' 8"  (1.727 m)   Wt 115.6 kg   SpO2 100%   BMI 38.75 kg/m   Medical Problem List and Plan: 1.  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  Antithrombotics: -DVT/anticoagulation:  Pharmaceutical: Other (comment)/Eliquis for pulmonary emboli             -antiplatelet therapy: N/A 3. Pain: continue Neurontin 300 mg twice daily, Robaxin 1000 mg every 8 hours, oxycodone as needed             Monitor for headaches with increased exertion  Controlled with meds on 10/24 4. Anxiety: continue Xanax 0.25 mg twice daily as needed             -antipsychotic agents: Seroquel 25 mg nightly 5. Neuropsych: This patient is not capable of making decisions on his own behalf. 6. Skin/Wound Care: will apply silver nitrate to trach stoma again 10/21 7. Fluids/Electrolytes/Nutrition: Routine in and outs              -eating 100% 8.  VDRF.  Status post tracheostomy tube 01/14/2021.  Decannulated 02/26/2021  9.  Atrial fibrillation with RVR.  Follow-up cardiology services.  Continue Amiodarone 200 mg daily, Lopressor 12.5 mg twice daily.               Monitor with increased activity  Controlled on 10/24 no dizziness Vitals:   03/10/21 1916 03/11/21 0548  BP: 140/70 122/73  Pulse: 63 (!) 59  Resp: 18 16  Temp: 98.7 F (37.1 C) 97.8 F (36.6 C)  SpO2: 97% 100%    10.  Diabetes mellitus with hyperglycemia.  Currently on Semglee 16 units twice daily.  Check blood sugars before meals and at bedtime.  Patient on Glucotrol  2.5 mg daily, Tradjenta 5 mg daily, Glucophage 1000 mg twice daily, Actos 45 mg daily prior to admission.  Resume oral agents as possible   CBG (last 3)  Recent Labs    03/10/21 1738 03/10/21 2059 03/11/21 0559  GLUCAP 242* 202* 75     10/19 resumed glucotrol     10/20 resumed actos 15mg  daily---observe cbg's today             10/22: increase Semglee to 17U  10/23: CBGs ranging  from 71 to 235: maintain current dose 10/24- am CBG low reduce Semglee, restart metformin 500mg  q am - monitor  11.  Acute blood loss anemia.  Continue iron supplement.               Hemoglobin 8.4 on 10/13, 8.3 on 10/17,   hx hemorrhoids only one episode of overt blood in stool  -still awaiting stool sample for OB - hgb stable at 8.4 10/20-->recheck stable 8.3 on 10/24 12.  Obesity.  BMI 32.74.  Dietary follow-up 13.  Slow transit constipation.  .  Bowel meds increased on 10/15, received sorbitol 14.?  OSA  Continue [place] nightly 15.  Hx of right middle ear hemorrhage, temporal and occipital fx-  -cipro ear gtts     LOS: 10 days A FACE TO FACE EVALUATION WAS PERFORMED  Charlett Blake 03/11/2021, 11:01 AM

## 2021-03-11 NOTE — Progress Notes (Signed)
Occupational Therapy TBI Note  Patient Details  Name: Angel [name] MRN: [id number] Date of Birth: [birth year]  Today's Date: 03/11/2021 OT Individual Time: 0017-4944 OT Individual Time Calculation (min): 69 min  Today's Date: 03/11/2021 OT Individual Time: 1135-1200 OT Individual Time Calculation (min): 25 min   Short Term Goals: Week 1:  OT Short Term Goal 1 (Week 1): Pt will consistently transfer with MIN A to toilet OT Short Term Goal 1 - Progress (Week 1): Met OT Short Term Goal 2 (Week 1): Pt will thread BLE into pants with AE PRN OT Short Term Goal 2 - Progress (Week 1): Met OT Short Term Goal 3 (Week 1): Pt will groom wiht set up OT Short Term Goal 3 - Progress (Week 1): Met OT Short Term Goal 4 (Week 1): Pt will don shirt wiht MIN A  Skilled Therapeutic Interventions/Progress Updates:    Pt received in wc wife present, no c/o pain and agreeable to OT session. Denies ADLs at this time.   Therapeutic activity Pt completes functional mobility to and from dayroom with RW and CGA. Posterior bias noted during ambulation and during standing tasks. Pt able to correct upright postural alignment with min cues. Pt completes 3 different activities in dayroom with overall good tolerance. 3 rounds of horseshoe/bean bag tossing activity in standing with .75lb weight on R wrist. Pt sat for ~30 seconds in between rounds. Sit > stands completed from mat & wc with overall min-CGA. Pt with slight SOB during exertion but was able to verbally cue himself to breathe several times. Pt performed dynamic standing activity with OTS grading reaching while tapping numbers sequentially forwards, backwards and alternating while crossing midline and forward/lateral flexion of trunk. Pt stood for intervals of 2 & 4 minutes during tabletop activities working on [employer] grasp and release. OTS facilitated placing objects further away to encourage forward reach. 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 Activity completed with 100% accuracy. Pt demo'd good orientation to detail. Pt left at end of session in wc with exit belt alarm on, call light in reach and all needs met. Wife [name] in room.    Session II: Pt received in wc in hallway with SLP, no c/o pain and agreeable to OT session. Pt requests to use commode. Competes toilet transfer and toileting tasks with overall MIN A. Continent of urine, charted in flowsheets.  Therapeutic activity Pt pushed to ortho gym for time management. Worked on [employer] activity with cognitive components focusing on visual recall, dynamic standing balance and visual perceptual skills. Pt able to complete 3 word recall with matching pictures in standing with 100% accuracy in 2 minutes. OTS grades activity for increased difficulty with pt unable to remember words or pictures in consecutive order, potentially d/t fatigue and frustration tolerance. Pt left at end of session in wc with exit belt alarm on, wife & visitor present, call light in reach and all needs met  Therapy Documentation Precautions:  Precautions Precautions: Fall Precaution Comments: watch BP, mild dizziness in standing, posterior bias in standing Restrictions Weight Bearing Restrictions: No Agitated Behavior Scale: TBI Observation Details Observation Environment: Pts room/dayroom Start of observation period - Date: 03/11/21 Start of observation period - Time: 0945 End of observation period - Date: 03/11/21 End of observation period - Time: 1100 Agitated Behavior Scale (DO NOT LEAVE BLANKS) Short attention span, easy distractibility, inability to concentrate: Absent Impulsive, impatient, low tolerance for pain or frustration: Present to a slight  degree Uncooperative, resistant to care, demanding: Absent Violent and/or threatening violence toward people or property: Absent Explosive and/or unpredictable anger: Absent Rocking, rubbing, moaning, or other self-stimulating  behavior: Absent Pulling at tubes, restraints, etc.: Absent Wandering from treatment areas: Absent Restlessness, pacing, excessive movement: Absent Repetitive behaviors, motor, and/or verbal: Absent Rapid, loud, or excessive talking: Absent Sudden changes of mood: Absent Easily initiated or excessive crying and/or laughter: Absent Self-abusiveness, physical and/or verbal: Present to a slight degree Agitated behavior scale total score: 16    03/11/21 1500  Observation Details  Observation Environment pts room/ortho gym  Start of observation period - Date 03/11/21  Start of observation period - Time 1130  End of observation period - Date 03/11/21  End of observation period - Time 1200  Agitated Behavior Scale (DO NOT LEAVE BLANKS)  Short attention span, easy distractibility, inability to concentrate 1  Impulsive, impatient, low tolerance for pain or frustration 2  Uncooperative, resistant to care, demanding 1  Violent and/or threatening violence toward people or property 1  Explosive and/or unpredictable anger 1  Rocking, rubbing, moaning, or other self-stimulating behavior 1  Pulling at tubes, restraints, etc. 1  Wandering from treatment areas 1  Restlessness, pacing, excessive movement 1  Repetitive behaviors, motor, and/or verbal 1  Rapid, loud, or excessive talking 1  Sudden changes of mood 1  Easily initiated or excessive crying and/or laughter 1  Self-abusiveness, physical and/or verbal 1  Agitated behavior scale total score 15    Therapy/Group: Individual Therapy  Cristi Gwynn 03/11/2021, 7:07 AM

## 2021-03-12 DIAGNOSIS — Z9981 Dependence on supplemental oxygen: Secondary | ICD-10-CM | POA: Diagnosis not present

## 2021-03-12 DIAGNOSIS — S069X0D Unspecified intracranial injury without loss of consciousness, subsequent encounter: Secondary | ICD-10-CM | POA: Diagnosis not present

## 2021-03-12 DIAGNOSIS — E1165 Type 2 diabetes mellitus with hyperglycemia: Secondary | ICD-10-CM | POA: Diagnosis not present

## 2021-03-12 DIAGNOSIS — D62 Acute posthemorrhagic anemia: Secondary | ICD-10-CM | POA: Diagnosis not present

## 2021-03-12 LAB — GLUCOSE, CAPILLARY
Glucose-Capillary: 72 mg/dL (ref 70–99)
Glucose-Capillary: 128 mg/dL — ABNORMAL HIGH (ref 70–99)
Glucose-Capillary: 168 mg/dL — ABNORMAL HIGH (ref 70–99)
Glucose-Capillary: 189 mg/dL — ABNORMAL HIGH (ref 70–99)

## 2021-03-12 MED ORDER — OXYCODONE HCL 5 MG PO TABS
5.0000 mg | ORAL_TABLET | ORAL | Status: DC | PRN
  Administered 2021-03-12 – 2021-03-14 (×4): 10 mg via ORAL
  Filled 2021-03-12 (×5): qty 2

## 2021-03-12 NOTE — Patient Care Conference (Signed)
Inpatient RehabilitationTeam Conference and Plan of Care Update Date: 03/12/2021   Time: 10:00 AM    Patient Name: Angel [name]      Medical Record Number: [id number]  Date of Birth: 1950/09/06 Sex: Male         Room/Bed: 4W17C/4W17C-01 Payor Info: Payor: [employer] / Plan: [employer] PPO / Product Type: *No Product type* /    Admit Date/Time:  03/01/2021  3:24 PM  Primary Diagnosis:  TBI (traumatic brain injury)  Hospital Problems: Principal Problem:   TBI (traumatic brain injury) Active Problems:   Supplemental oxygen dependent   Labile blood glucose   Acute pulmonary embolism without acute cor pulmonale ([place])   Controlled type 2 diabetes mellitus with hyperglycemia, with long-term current use of insulin [facility])    Expected Discharge Date: Expected Discharge Date: 03/15/21  Team Members Present: Physician leading conference: Dr. Delice Lesch Social Worker Present: Loralee Pacas, [place] Nurse Present: Dorthula Nettles, RN PT Present: Apolinar Junes, PT OT Present: Laverle Hobby, OT;Other (comment) Delsa Sale Worischeck, Student OT) SLP Present: Sherren Kerns, SLP PPS Coordinator present : Ileana Ladd, PT     Current Status/Progress Goal Weekly Team Focus  Bowel/Bladder   Continent x2. LBM 10/24  Remain continent  Assess Qshift and prn   Swallow/Nutrition/ Hydration             ADL's   Sit to stand min A, UB dressing/bathing setup, LB dressing/bathing min A  Supervision  adl training, functional transfers, act tolerance, dynamic standing balance   Mobility   CGA-supervision overall, intermittent min A with fatigue  Supervision-CGA overall, gait >150 ft, 4 steps BHR  B lower extremity strengthening, functional mobility, balance, gait training, activity tolerance, safety awareness, patient/caregiver education   Communication             Safety/Cognition/ Behavioral Observations  Supervision  Supervision  Family Edu   Pain   Has pain in right hand.  PRN available and effective.  Pain <3/10  Assess Qshift and pRN   Skin   Old trach site, prn dressing change.  Remainder of skin to remain intact.  Assess Qshift and PRN     Discharge Planning:  D/c to home with his wife who will provide 24/7 care. Son will take FMLA when pt discharges. Dtr to proivide PRN support. Wife reports she is here daily and available for family edu at anytime. SW waiting on d/c recs.   Team Discussion: Medically stable. Wife has questions regarding sleep study. BLE has edema due to sitting in chair and legs being dependent. Continent B/B, reports pain as tolerable. Trach site healing. Does report tingling to hands. Lowered to floor by therapy 03/11/21 due to wheelchair locks not being locked and impulsiveness. Patient reported no injuries. Daughter in for family education tomorrow.  Patient on target to meet rehab goals: yes, supervision goals overall. Min assist for STS, setup for ADL's. Did stair with min assist. Supervision for attention, mildly complex problem solving.  *See Care Plan and progress notes for long and short-term goals.   Revisions to Treatment Plan:  MD adjusting medications.  Teaching Needs: Family education, medication management, diabetes management, skin/wound care, safety awareness.   Current Barriers to Discharge: Decreased caregiver support, Home enviroment access/layout, Wound care, Lack of/limited family support, Weight, and Medication compliance  Possible Resolutions to Barriers: Family education, wheelchair education, balance and safety awareness.     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 fall 12/28/2020  Barriers to Discharge: Medical stability;Other (comments)  Barriers to Discharge Comments: Balance Possible Resolutions to Barriers/Weekly Focus: Therapies, patient and family edu, optimize CBGs   Continued Need for Acute Rehabilitation Level of Care: The  patient requires daily medical management by a physician with specialized training in physical medicine and rehabilitation for the following reasons: Direction of a multidisciplinary physical rehabilitation program to maximize functional independence : Yes Medical management of patient stability for increased activity during participation in an intensive rehabilitation regime.: Yes Analysis of laboratory values and/or radiology reports with any subsequent need for medication adjustment and/or medical intervention. : Yes   I attest that I was present, lead the team conference, and concur with the assessment and plan of the team.   Cristi Loron 03/12/2021, 10:17 AM

## 2021-03-12 NOTE — Progress Notes (Signed)
Occupational Therapy TBI Note  Patient Details  Name: Angel [name] MRN: [id number] Date of Birth: [birth year]  Today's Date: 03/12/2021 OT Individual Time: 1030-1130 OT Individual Time Calculation (min): 60 min   Today's Date: 03/12/2021 OT Individual Time: 1300-1350 OT Individual Time Calculation (min): 50 min    Short Term Goals: Week 1:  OT Short Term Goal 1 (Week 1): Pt will consistently transfer with MIN A to toilet OT Short Term Goal 1 - Progress (Week 1): Met OT Short Term Goal 2 (Week 1): Pt will thread BLE into pants with AE PRN OT Short Term Goal 2 - Progress (Week 1): Met OT Short Term Goal 3 (Week 1): Pt will groom wiht set up OT Short Term Goal 3 - Progress (Week 1): Met OT Short Term Goal 4 (Week 1): Pt will don shirt wiht MIN A Week 2:  OT Short Term Goal 1 (Week 2): STG= LTG d/t ELOS  Session I: Skilled Therapeutic Interventions/Progress Updates:    Pt received in wc with wife present, reporting that he felt tired from PT earlier, but agreeable to OT. Session focused on dynamic standing & activity tolerance for functional tasks.   Therapeutic activity Pt completed functional mobility from room to gym with RW and CGA. Pt with slow pace but pushed himself appropriately. Pt completed clothes pin activity standing with RW to place pins of varying strength on basketball hoop. OTS grades to challenge pt to reach across midline using pincer grasp. Pt demonstrated weak grasp strength and needed min cuing to use pincer grasp with B hands. Short rest breaks provided between tasks. Simulated dressing/toileting task with clothespins pinned on pants and weight shifting to place. Pt min A for sit <> stands during activity with slight SOB throughout.   Pt worked on standing tolerance throwing ball to trampoline x 4 sets with OTS increasing difficulty. Pt required rest breaks between sets, but was able to stand for 13 repetitions with no hand support on RW. Performs toe-tapping x 20 with  alternating feet in diagonal pattern and second set 15x tapping two cones in a row. Pt transported back to room in wc for time management.   Pt left at end of session in recliner with exit pad alarm on, wife present, call light in reach and all needs met   Session II Pt received in wc with no c/o pain, wife present and agreeable to OT. Pt and wife reported that they have a walk in shower with a built-in chair (wife to take pictures). Beginning of session was spent discussing d/c planning and equipment needs.   Skilled interventions include: Pt completed functional mobility to rehab apartment with CGA and RW. Completed multiple sit <> stands to practice transitions from furniture at home. Overall, pt sit <> stand from wc or bed at [facility] level. Pt had increased difficulty getting up from low couch - required MIN-MOD A of 2 to power up from lower surface and 2 attempts. Pt with good efforts despite frusteration. Sit <> stands from recliner with min A of 2 - pt with more success from this surface vs the couch. Discussed shower transfers and educated on options for shower chair and TTB. Pt practiced using RW to step into simulated shower sideways and backwards. Pt reports that backwards feels safer, but unsure if setup at home is ideal to use RW. OT educates on safety in shower, and provides visual demonstration of TTB with pt declining to try this session. Pt verbalized understanding of TTB  and curtain placement for home. Pt pushed in wc back to room for time mgmt. Pt requests to use bathroom end of session. Voids urine in standing (charted in flowsheets) with heavy use of grab bars to sit <> stand back to wc. Pt left at end of session in wc with exit alarm on, call light in reach and all needs met   Therapy Documentation Precautions:  Precautions Precautions: Fall Precaution Comments: watch BP, mild dizziness in standing, posterior bias in standing Restrictions Weight Bearing Restrictions: No     Agitated Behavior Scale: TBI Observation Details Observation Environment: pts room/gym Start of observation period - Date: 03/12/21 Start of observation period - Time: 1030 End of observation period - Date: 03/12/21 End of observation period - Time: 1130 Agitated Behavior Scale (DO NOT LEAVE BLANKS) Short attention span, easy distractibility, inability to concentrate: Absent Impulsive, impatient, low tolerance for pain or frustration: Absent Uncooperative, resistant to care, demanding: Absent Violent and/or threatening violence toward people or property: Absent Explosive and/or unpredictable anger: Absent Rocking, rubbing, moaning, or other self-stimulating behavior: Absent Pulling at tubes, restraints, etc.: Absent Wandering from treatment areas: Absent Restlessness, pacing, excessive movement: Absent Repetitive behaviors, motor, and/or verbal: Absent Rapid, loud, or excessive talking: Absent Sudden changes of mood: Absent Easily initiated or excessive crying and/or laughter: Absent Self-abusiveness, physical and/or verbal: Present to a slight degree Agitated behavior scale total score: 15    03/12/21 1500  Observation Details  Observation Environment pts room/gym  Start of observation period - Date 03/12/21  Start of observation period - Time 1300  End of observation period - Date 03/12/21  End of observation period - Time 1345  Agitated Behavior Scale (DO NOT LEAVE BLANKS)  Short attention span, easy distractibility, inability to concentrate 1  Impulsive, impatient, low tolerance for pain or frustration 1  Uncooperative, resistant to care, demanding 1  Violent and/or threatening violence toward people or property 1  Explosive and/or unpredictable anger 1  Rocking, rubbing, moaning, or other self-stimulating behavior 1  Pulling at tubes, restraints, etc. 1  Wandering from treatment areas 1  Restlessness, pacing, excessive movement 1  Repetitive behaviors, motor, and/or  verbal 1  Rapid, loud, or excessive talking 1  Sudden changes of mood 1  Easily initiated or excessive crying and/or laughter 1  Self-abusiveness, physical and/or verbal 1  Agitated behavior scale total score 14    Therapy/Group: Individual Therapy  Teagan Heidrick 03/12/2021, 7:28 AM

## 2021-03-12 NOTE — Plan of Care (Signed)
  Problem: RH Balance Goal: LTG Patient will maintain dynamic sitting balance (PT) Description: LTG:  Patient will maintain dynamic sitting balance with assistance during mobility activities (PT) Flowsheets (Taken 03/12/2021 0555) LTG: Pt will maintain dynamic sitting balance during mobility activities with:: (upgraded goal due to patient's progress with strength and balance with mobility.) Independent Note: upgraded goal due to patient's progress with strength and balance with mobility.   Problem: RH Bed to Chair Transfers Goal: LTG Patient will perform bed/chair transfers w/assist (PT) Description: LTG: Patient will perform bed to chair transfers with assistance (PT). Flowsheets (Taken 03/12/2021 0555) LTG: Pt will perform Bed to Chair Transfers with assistance level: (upgraded goal due to patient's progress with strength and balance with mobility.) Supervision/Verbal cueing Note: upgraded goal due to patient's progress with strength and balance with mobility.   Problem: RH Ambulation Goal: LTG Patient will ambulate in controlled environment (PT) Description: LTG: Patient will ambulate in a controlled environment, # of feet with assistance (PT). Flowsheets (Taken 03/12/2021 0555) LTG: Pt will ambulate in controlled environ  assist needed:: (upgraded goal due to patient's progress with strength and balance with mobility.) Supervision/Verbal cueing LTG: Ambulation distance in controlled environment: >150 ft consistently Note: upgraded goal due to patient's progress with strength and balance with mobility. Goal: LTG Patient will ambulate in home environment (PT) Description: LTG: Patient will ambulate in home environment, # of feet with assistance (PT). Flowsheets Taken 03/12/2021 0555 by Apolinar Junes L, PT LTG: Pt will ambulate in home environ  assist needed:: (upgraded goal due to patient's progress with strength and balance with mobility.) Supervision/Verbal cueing Taken 03/02/2021  1256 by Alger Simons, PT LTG: Ambulation distance in home environment: at least 50 ft with LRAD Note: upgraded goal due to patient's progress with strength and balance with mobility.

## 2021-03-12 NOTE — Discharge Summary (Signed)
Physician Discharge Summary  Patient ID: Angel [name] MRN: [id number] DOB/AGE: 70-Mar-1952 70 y.o.  Admit date: 03/01/2021 Discharge date: 03/15/2021  Discharge Diagnoses:  Principal Problem:   TBI (traumatic brain injury) Active Problems:   Supplemental oxygen dependent   Labile blood glucose   Acute pulmonary embolism without acute cor pulmonale (HCC)   Controlled type 2 diabetes mellitus with hyperglycemia, with long-term current use of insulin (HCC)   Hypoglycemia VDRF Atrial fibrillation Acute blood loss anemia Obesity Constipation History of right middle ear hemorrhage, temporal occipital fracture  Discharged Condition: Stable  Significant Diagnostic Studies: IR Removal Tun Cv Cath W/O FL  Result Date: 02/14/2021 INDICATION: Patient with a tunneled dialysis catheter placed for acute kidney injury has experienced renal recovery. Interventional radiology asked to remove tunneled dialysis catheter that was placed February 05, 2021 by IR. EXAM: REMOVAL TUNNELED CENTRAL VENOUS CATHETER MEDICATIONS: None ANESTHESIA/SEDATION: None FLUOROSCOPY TIME:  None COMPLICATIONS: None immediate. PROCEDURE: Informed written consent was obtained from the patient's wife after a thorough discussion of the procedural risks, benefits and alternatives. All questions were addressed. Maximal Sterile Barrier Technique was utilized including caps, mask, sterile gowns, sterile gloves, sterile drape, hand hygiene and skin antiseptic. A timeout was performed prior to the initiation of the procedure. The patient's right chest and catheter were prepped and draped in a normal sterile fashion. Using gentle manual traction the cuff of the catheter was exposed and the catheter was removed in it's entirety. Pressure was held till hemostasis was obtained. A sterile dressing was applied. The patient tolerated the procedure well with no immediate complications. IMPRESSION: Successful catheter removal as described above. Read  by: Soyla Dryer, NP Electronically Signed   By: Markus Daft M.D.   On: 02/14/2021 14:54   DG CHEST PORT 1 VIEW  Result Date: 02/17/2021 CLINICAL DATA:  Cough.  Evaluate for pneumothorax. EXAM: PORTABLE CHEST 1 VIEW COMPARISON:  February 04, 2021 FINDINGS: Stable tracheostomy tube. The Vas-Cath is been removed. No pneumothorax. Stable cardiomegaly. The hila and mediastinum are unremarkable. No pulmonary nodules, masses, or focal infiltrates. IMPRESSION: Support apparatus as above.  No acute abnormalities noted. Electronically Signed   By: Dorise Bullion III M.D.   On: 02/17/2021 13:10    Labs:  Basic Metabolic Panel: Recent Labs  Lab 03/11/21 0548  NA 139  K 3.6  CL 101  CO2 29  GLUCOSE 73  BUN 21  CREATININE 1.15  CALCIUM 8.7*    CBC: Recent Labs  Lab 03/11/21 0548  WBC 4.8  HGB 8.3*  HCT 27.2*  MCV 93.5  PLT 212    CBG: Recent Labs  Lab 03/14/21 0642 03/14/21 0708 03/14/21 1123 03/14/21 1701 03/14/21 2050  GLUCAP 56* 73 117* 205* 124*   Family history.  Positive for hypertension as well as hyperlipidemia.  Denies any colon cancer esophageal cancer or rectal cancer  Brief HPI:   Angel [name] is a 70 y.o. right-handed male with history of diabetes mellitus hyperlipidemia and hypertension.  Lives with spouse independent prior to admission.  Presented 12/28/2020 after a fall down approximately 8 steps while going downstairs to his basement with positive loss of consciousness.  Patient reportedly agitated in route confused with emesis x2.  He did have some blood coming from his right ear.  He required intubation for airway protection.  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  Additional extra-axial hemorrhage over the right convexity.  Areas of subarachnoid hemorrhage in the anterior frontal lobes bilaterally with blood along the falx likely subdural.  Minimally displaced right occipital skull  fracture.  A longitudinal right temporal bone fracture extending through the right middle ear cavity.  CT cervical spine negative.  CT chest abdomen pelvis negative.  There was an incidental finding of a 2.9 cm heterogeneous right thyroid nodule present recommend thyroid ultrasound as outpatient.  Admission chemistries unremarkable except lactic acid 4.0, potassium 3.3.  Neurosurgery follow-up recommended conservative care.  Maintained on Keppra x7 days for seizure prophylaxis.  ENT Dr. Constance Holster in regards to temporal bone fractures again advised conservative care.  Hospital course cardiology services consulted 01/07/2021 for evaluation of atrial fibrillation with RVR started on amiodarone with bolus load.  Echocardiogram with ejection fraction of 60 to 65% no wall motion abnormalities.  CT angiogram of the chest showed bilateral pulmonary emboli most significantly affecting the right lower lobe pulmonary artery with associated reduced perfusion of the posterior medial basilar segments of the right lower lobe.  Vascular ultrasound left upper extremity findings consistent with age-indeterminate superficial vein thrombosis involving the left cephalic vein and left basilic vein 11/26/6267.  Patient underwent placement of tracheostomy 01/14/2021 per Dr.Lovick.  Patient was cleared after placement of tracheostomy tube to begin Eliquis for pulmonary emboli with latest cranial CT scan stable.  His tracheostomy tube was downsized and later decannulated 02/26/2021.  Hospital course complicated by AKI with nephrology service consulted 01/13/2021 with creatinine 4.5-4.96 felt to be likely secondary to hypotensive episode suspect hospital course sepsis and IV contrast.  He did require short course of hemodialysis and tunneled catheter had been placed latest hemodialysis 02/08/2021 and AKI resolved with latest creatinine 1.06 and tunneled catheter has since been removed.  Initially with nasogastric tube feeds diet slowly advanced.   Hospital course blood loss anemia transfuse 1 unit 02/04/2021 latest hemoglobin 8.4.  Palliative care consulted to establish goals of care.  Therapy evaluations completed due to patient's TBI/SAH was admitted for a comprehensive rehab program.   Hospital Course: GIAVANNI ODONOVAN was admitted to rehab 03/01/2021 for inpatient therapies to consist of PT, ST and OT at least three hours five days a week. Past admission physiatrist, therapy team and rehab RN have worked together to provide customized collaborative inpatient rehab.  Pertain to patient's TBI/SAH/occipital and temporal bone fractures/right TM rupture secondary to fall 12/28/2020 conservative care per neurosurgery as well as ENT Dr. Constance Holster.  He had been cleared to initiate Eliquis for pulmonary emboli no bleeding episodes.  Oxygen saturations maintained.  Pain managed use of Neurontin as well as Robaxin oxycodone as needed.  Mood stabilization with Seroquel nightly Xanax as needed emotional support provided follow-up per neuropsychology.  VDRF decannulated 02/26/2021 weaned from oxygen therapy.  Atrial fibrillation RVR follow-up cardiology services cardiac rate controlled maintained on amiodarone as well as Lopressor.  Blood sugars controlled and resuming of oral agents.  Acute blood loss anemia stable no bleeding episodes.  Obesity BMI 32.74 dietary follow-up.   Blood pressures were monitored on TID basis and soft and monitored  Diabetes has been monitored with ac/hs CBG checks and SSI was use prn for tighter BS control.    Rehab course: During patient's stay in rehab weekly team conferences were held to monitor patient's progress, set goals and discuss barriers to discharge. At admission, patient required moderate assist 12 feet rolling walker max assist sit to supine moderate assist supine to sit  Physical exam.  Blood pressure 118/76 pulse 70 temperature  97.6 respirations 15 oxygen saturations 96% room air Constitutional.  No acute  distress HEENT Head.  Normocephalic and atraumatic Eyes.  Pupils round and reactive to light no discharge without nystagmus Neck.  Supple nontender no JVD without thyromegaly Cardiac regular rate rhythm without extra sounds or murmur heard Abdomen.  Soft nontender positive bowel sounds without rebound Respiratory effort normal no respiratory distress without wheeze Skin.  Tracheostomy site healed Musculoskeletal no edema or tenderness extremities Neurologic.  Alert.  Makes eye contact with examiner.  Provides name and age.  Follows simple commands.  He was not able to recall full events of the fall. Motor.  4-4/5 throughout  He/She  has had improvement in activity tolerance, balance, postural control as well as ability to compensate for deficits. He/She has had improvement in functional use RUE/LUE  and RLE/LLE as well as improvement in awareness.  Ambulates 180 feet rolling walker supervision.  Perform sit to stand x6 supervision.  Patient did receive necessary rest breaks for fatigue factors.  ADLs min assist for sit to stand contact-guard for balance.  Patient remained incredibly motivated.  He was able to bathe and dress upper body supervision minimal assist for lower body.  Speech therapy follow-up facilitated sessions by providing extra time overall supervision level verbal cues for problem solving during complex scheduling task.  Demonstrated selective attention task in a mildly distracted environment.  Full family teaching completed plan discharged to home       Disposition: Discharged to home    Diet: Carb modified  Special Instructions: No driving smoking or alcohol  Medications at discharge 1.  Tylenol as needed 2.  Xanax 0.25 mg p.o. twice daily as needed 3.  Amiodarone 200 mg p.o. daily 4.  Eliquis 5 mg p.o. twice daily 5.  Vitamin C 500 mg p.o. twice daily 6.  Colace 100 mg p.o. twice daily 7.  Ferrous sulfate 325 mg p.o. twice daily 8.  Neurontin 300 mg p.o. twice  daily 9.  Glucotrol 2.5 mg p.o. daily 10.  Glucophage 1000 mg p.o.twice daily 11.  Robaxin 1000 mg p.o. every 8 hours 12.  Lopressor 12.5 mg p.o. twice daily 13.  Oxycodone every 4 hours as needed pain 14.  Protonix 40 mg p.o. daily 15.  Actos 15 mg p.o. daily 16.  MiraLAX twice daily hold for loose stools 17.  Seroquel 25 mg p.o. nightly 18.  Crestor 10 mg nightly  30-35 minutes were spent completing discharge summary and discharge planning  Discharge Instructions     Ambulatory referral to Physical Medicine Rehab   Complete by: As directed    Moderate complexity follow-up 1 to 2 weeks TBI        Follow-up Information     Meredith Staggers, MD Follow up.   Specialty: Physical Medicine and Rehabilitation Why: Office to call for appointment Contact information: [address] [place] [address] [phone]         Earnie Larsson, MD Follow up.   Specialty: Neurosurgery Why: Call for appointment Contact information: [address] [place] [address] [phone]         Izora Gala, MD Follow up.   Specialty: Otolaryngology Why: Call for appointment Contact information: [address] [place] [address] [phone]         Werner Lean, MD Follow up.   Specialty: Cardiology Why: Call for appointment Contact information: [address] [name] [address] [phone]  Signed: Lavon Paganini Eyleen Rawlinson 03/15/2021, 4:59 AM

## 2021-03-12 NOTE — Progress Notes (Signed)
Physical Therapy TBI Note  Patient Details  Name: Angel [name] MRN: [id number] Date of Birth: [birth year]  Today's Date: 03/12/2021 PT Individual Time: 1430-1500 PT Individual Time Calculation (min): 30 min   Short Term Goals: Week 2:  PT Short Term Goal 1 (Week 2): Pt will transfer sit to stand consistently w/supervision to LRAD PT Short Term Goal 2 (Week 2): Pt will perform basic transfers w/RW and supervision safely PT Short Term Goal 3 (Week 2): Pt will ambulate 18ft w/RW and cga PT Short Term Goal 4 (Week 2): Pt will perform functional household ambulation x 44ft w/RW and cga PT Short Term Goal 5 (Week 2): Pt will maintain balance w/overhead reach and single UE support on RW w/cga and no lob  Skilled Therapeutic Interventions/Progress Updates:     Pt received seated in [facility] and agrees to therapy. No complaint of pain. WC transport outside for time management and energy conservation. Pt practices sit to tand from multiple surfaces for strengthening and community reintegration training. Pt requires minA to light modA for transfers from lower surfaces, with verbal and tactile cues for body mechanics, sequencing, and weight transition. Pt ambulates over unlevel and varying surfaces with RW and CGA, with PT providing verbal cues to increase proximity to RW for safety, utilizing reciprocal gait pattern rather than step-to pattern, and RW management when approaching bench or WC to sit. Pt ambulates bouts of 125' x2 and 77' x1 with extended seated rest breaks between each bout. Pt handed off to fellow PT at end of session.  Therapy Documentation Precautions:  Precautions Precautions: Fall Precaution Comments: watch BP, mild dizziness in standing, posterior bias in standing Restrictions Weight Bearing Restrictions: No Agitated Behavior Scale: TBI Observation Details Observation Environment: pts room/gym Start of observation period - Date: 03/12/21 Start of observation period - Time:  1030 End of observation period - Date: 03/12/21 End of observation period - Time: 1130 Agitated Behavior Scale (DO NOT LEAVE BLANKS) Short attention span, easy distractibility, inability to concentrate: Absent Impulsive, impatient, low tolerance for pain or frustration: Absent Uncooperative, resistant to care, demanding: Absent Violent and/or threatening violence toward people or property: Absent Explosive and/or unpredictable anger: Absent Rocking, rubbing, moaning, or other self-stimulating behavior: Absent Pulling at tubes, restraints, etc.: Absent Wandering from treatment areas: Absent Restlessness, pacing, excessive movement: Absent Repetitive behaviors, motor, and/or verbal: Absent Rapid, loud, or excessive talking: Absent Sudden changes of mood: Absent Easily initiated or excessive crying and/or laughter: Absent Self-abusiveness, physical and/or verbal: Present to a slight degree Agitated behavior scale total score: 15  Therapy/Group: Individual Therapy  Breck Coons, PT, DPT 03/12/2021, 3:22 PM

## 2021-03-12 NOTE — Progress Notes (Signed)
Patient ID: Angel [name], male   DOB: 22-Sep-1950, 70 y.o.   MRN: [id number]  SW met with pt and pt wife in room to provide updates from team conference, and d/c date 10/28. SW discussed d/c recommendations of outpatient therapies. Prefers [facility] if possible, otherwise, amenable to [facility]. SW informed will f/u and confirm outpatient location. Reports she and her dtr will be here tomorrow for family edu.   Loralee Pacas, MSW, [place] Office: [phone] Cell: [phone] Fax: [phone]

## 2021-03-12 NOTE — Progress Notes (Signed)
Physical Therapy TBI Note  Patient Details  Name: Angel [name] MRN: [id number] Date of Birth: [birth year]  Today's Date: 03/13/2021 PT Individual Time: 0905-1000 and 1500-1530  Short Term Goals: Week 2:  PT Short Term Goal 1 (Week 2): Pt will transfer sit to stand consistently w/supervision to LRAD PT Short Term Goal 2 (Week 2): Pt will perform basic transfers w/RW and supervision safely PT Short Term Goal 3 (Week 2): Pt will ambulate 115ft w/RW and cga PT Short Term Goal 4 (Week 2): Pt will perform functional household ambulation x 61ft w/RW and cga PT Short Term Goal 5 (Week 2): Pt will maintain balance w/overhead reach and single UE support on RW w/cga and no lob  Skilled Therapeutic Interventions/Progress Updates:     Session 1: Patient in w/c, with his wife in the room, with pants doffed, but in hand to don, per patient's preference, upon PT arrival. Patient alert and agreeable to PT session. Patient denied pain during session.  Therapeutic Activity: Transfers: Patient performed sit to/from stand x5 with CGA and min A x1 due to decreased forward weight shift. Provided verbal cues for scooting forward, forward weight shift, and gluteal activation for hip extension to boost to standing. Patient performed standing balance >2 min with supervision with intermittent upper extremity support on RW while performing functional task of taking medications provided by LPN during session.   Gait Training:  Patient ambulated >150 feet x2 using RW with supervision. Ambulated with decreased gait speed, decreased step length and height, increased B hip and knee flexion in stance, forward trunk lean, and downward head gaze. Provided verbal cues for erect posture, looking ahead, shoulder depression, increased step height, and increased step length on L. Patient ascended/descended 4x6" steps using B rails with min A-CGA. Performed step-to gait pattern leading with R x3 and L x1 while ascending (L with mild  buckling, noted to be safer/stronger with R) and leading with L while descending. Provided cues for technique and sequencing.  Patient with "overwhelming fatigue" and emotional response after stair training, as this was the first time he had performed stair training and he admitted having increased fear of falling, but relief when successful. Provided therapeutic listening for patient to express feelings about progress with mobility. Provided education on reasonable response to stair training given mechanism of injury was fall down steps. Provided prolonged seated rest break and continued education about TBI recovery and fall prevention and home modifications. Patient reports he will not be going down the "steep" steps to the basement any more, but coming in from outside where there is a level entry when he needs to return to the basement.   Patient in w/c with his wife in the room, reported his success during the session to his wife at end of session with breaks locked, chair alarm set, and all needs within reach.   Session 2: Patient in w/c received from Robina Ade, [place] outside [facility] upon PT arrival. Patient alert and agreeable to continue PT session with this therapist. Patient denied pain during session.  Patient reported increased fatigue since stair training this morning, however, noted good success with ambulating over unlevel sidewalk with previous PT. Reported need for increased assist to stand this afternoon and indicated that he was discouraged by his performance. Provided education on fluctuations in performance based on factors such as sleep quality, activities performed earlier in the day, amount of environmental stimulation, and increased distractions. Informed patient on general physiology of TBI and common lingering symptoms with  focus on fatigue and challenges and/or increased effort with processing and filtering information. Patient became emotional and discussed his experiences earlier in his  stay that included hallucinations, lucid dreams, delusions, confusion, and disorganized thinking. Reassured the patient that these are common symptoms during recovery from TBI, and patient reports these symptoms have resolved at this time.   Transported patient back to the room dependently via w/c due to increased fatigue. Patient ambulated 15 feet to the recliner in his room with min A to stand and supervision during gait using RW. Provided cues as above. Discussed patient's symptoms and common changes in behavior or personality with patient and his wife. Wife endorses that the patient appears to be at baseline at this time. Educated on symptoms to look out for at d/c including poor emotional regulation, poor frustration tolerance, increased levels of fatigue, headache, visual changes, and dizziness. Patient very appreciative of education/discussion during session.   Patient in recliner with B lower extremities elevated and his wife in the room at end of session with breaks locked, chair alarm set, and all needs within reach.   Therapy Documentation Precautions:  Precautions Precautions: Fall Precaution Comments: watch BP, mild dizziness in standing, posterior bias in standing Restrictions Weight Bearing Restrictions: No Agitated Behavior Scale: TBI Observation Details Observation Environment: pts room/gym Start of observation period - Date: 03/12/21 Start of observation period - Time: 0905 End of observation period - Date: 03/12/21 End of observation period - Time: 1000 Agitated Behavior Scale (DO NOT LEAVE BLANKS) Short attention span, easy distractibility, inability to concentrate: Absent Impulsive, impatient, low tolerance for pain or frustration: Absent Uncooperative, resistant to care, demanding: Absent Violent and/or threatening violence toward people or property: Absent Explosive and/or unpredictable anger: Absent Rocking, rubbing, moaning, or other self-stimulating behavior:  Absent Pulling at tubes, restraints, etc.: Absent Wandering from treatment areas: Absent Restlessness, pacing, excessive movement: Absent Repetitive behaviors, motor, and/or verbal: Absent Rapid, loud, or excessive talking: Absent Sudden changes of mood: Absent Easily initiated or excessive crying and/or laughter: Absent Self-abusiveness, physical and/or verbal: Present to a slight degree Agitated behavior scale total score: 15 Agitated Behavior Scale: TBI Observation Details Observation Environment: pts room/gym Start of observation period - Date: 03/12/21 Start of observation period - Time: 1500 End of observation period - Date: 03/12/21 End of observation period - Time: 1530 Agitated Behavior Scale (DO NOT LEAVE BLANKS) Short attention span, easy distractibility, inability to concentrate: Absent Impulsive, impatient, low tolerance for pain or frustration: Absent Uncooperative, resistant to care, demanding: Absent Violent and/or threatening violence toward people or property: Absent Explosive and/or unpredictable anger: Absent Rocking, rubbing, moaning, or other self-stimulating behavior: Absent Pulling at tubes, restraints, etc.: Absent Wandering from treatment areas: Absent Restlessness, pacing, excessive movement: Absent Repetitive behaviors, motor, and/or verbal: Absent Rapid, loud, or excessive talking: Absent Sudden changes of mood: Absent Easily initiated or excessive crying and/or laughter: Absent Self-abusiveness, physical and/or verbal: Present to a slight degree Agitated behavior scale total score: 15   Therapy/Group: Individual Therapy  Nathaniel Wakeley L Minoru Chap PT, DPT  03/13/2021, 4:00 PM

## 2021-03-12 NOTE — Progress Notes (Signed)
Angel [name]  Subjective/Complaints: Patient seen sitting up in his chair this AM.  He states he slept well overnight after receiving humidified O2.  Wife with questions regarding sleep study.  Patient with questions regarding LE edema- appears to be sitting in a dependent position for 12 hours/day.   ROS: Denies CP, SOB, N/V/D  Objective: Vital Signs: Blood pressure 120/68, pulse (!) 56, temperature 98.1 F (36.7 C), temperature source Oral, resp. rate 16, height 5\' 8"  (1.727 m), weight 115.6 kg, SpO2 100 %. No results found. Recent Labs    03/11/21 0548  WBC 4.8  HGB 8.3*  HCT 27.2*  PLT 212     Recent Labs    03/11/21 0548  NA 139  K 3.6  CL 101  CO2 29  GLUCOSE 73  BUN 21  CREATININE 1.15  CALCIUM 8.7*      Intake/Output Summary (Last 24 hours) at 03/12/2021 1002 Last data filed at 03/11/2021 1823 Gross per 24 hour  Intake 1004 ml  Output --  Net 1004 ml         Physical Exam: BP 120/68 (BP Location: Right Arm)   Pulse (!) 56   Temp 98.1 F (36.7 C) (Oral)   Resp 16   Ht 5\' 8"  (1.727 m)   Wt 115.6 kg   SpO2 100%   BMI 38.75 kg/m  Constitutional: No distress . Vital signs reviewed. HENT: Normocephalic.  Atraumatic. Eyes: EOMI. No discharge. Cardiovascular: No JVD.  RRR. Respiratory: Normal effort.  No stridor.  Bilateral clear to auscultation. GI: Non-distended.  BS +. Skin: Warm and dry.  Intact. Psych: Normal mood.  Normal behavior. Musc: No edema in extremities.  No tenderness in extremities. Neuro: Alert and oriented Improved insight and awareness.   Follows all simple commands.   Motor: 5/5 in BUE , 3->4/5 in BLE prox to distal, stable  Assessment/Plan: 1. Functional deficits which require 3+ hours per day of interdisciplinary therapy in a comprehensive inpatient rehab setting. Physiatrist is providing close team supervision and 24 hour management of active medical problems listed  below. Physiatrist and rehab team continue to assess barriers to discharge/monitor patient progress toward functional and medical goals   Care Tool:  Bathing    Body parts bathed by patient: Right arm, Chest, Abdomen, Front perineal area, Right upper leg, Left upper leg, Face   Body parts bathed by helper: Left arm, Buttocks, Right lower leg, Left lower leg     Bathing assist Assist Level: Moderate Assistance - Patient 50 - 74%     Upper Body Dressing/Undressing Upper body dressing   What is the patient wearing?: Pull over shirt    Upper body assist Assist Level: Moderate Assistance - Patient 50 - 74%    Lower Body Dressing/Undressing Lower body dressing      What is the patient wearing?: Pants, Incontinence brief     Lower body assist Assist for lower body dressing: Total Assistance - Patient < 25%     Toileting Toileting    Toileting assist Assist for toileting: Total Assistance - Patient < 25%     Transfers Chair/bed transfer  Transfers assist     Chair/bed transfer assist level: Supervision/Verbal cueing Chair/bed transfer assistive device: Programmer, multimedia   Ambulation assist      Assist level: Supervision/Verbal cueing Assistive device: Walker-rolling Max distance: 180 ft   Walk 10 feet activity   Assist  Walk 10 feet activity did not occur:  Safety/medical concerns  Assist level: Supervision/Verbal cueing Assistive device: Walker-rolling   Walk 50 feet activity   Assist Walk 50 feet with 2 turns activity did not occur: Safety/medical concerns  Assist level: Supervision/Verbal cueing Assistive device: Walker-rolling    Walk 150 feet activity   Assist Walk 150 feet activity did not occur: Safety/medical concerns  Assist level: Supervision/Verbal cueing Assistive device: Walker-rolling    Walk 10 feet on uneven surface  activity   Assist Walk 10 feet on uneven surfaces activity did not occur: Safety/medical  concerns         Wheelchair     Assist Is the patient using a wheelchair?: Yes Type of Wheelchair: Manual    Wheelchair assist level: Total Assistance - Patient < 25% Max wheelchair distance: >200 ft    Wheelchair 50 feet with 2 turns activity    Assist        Assist Level: Total Assistance - Patient < 25%   Wheelchair 150 feet activity     Assist      Assist Level: Total Assistance - Patient < 25%  BP 120/68 (BP Location: Right Arm)   Pulse (!) 56   Temp 98.1 F (36.7 C) (Oral)   Resp 16   Ht 5\' 8"  (1.727 m)   Wt 115.6 kg   SpO2 100%   BMI 38.75 kg/m   Medical Problem List and Plan: 1.  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 Please see conference [name] from today as well.  2.  Antithrombotics: -DVT/anticoagulation:  Pharmaceutical: Other (comment)/Eliquis for pulmonary emboli             -antiplatelet therapy: N/A 3. Pain: continue Neurontin 300 mg twice daily, Robaxin 1000 mg every 8 hours, oxycodone as needed             Monitor for headaches with increased exertion  Controlled with meds on 10/25 4. Anxiety: continue Xanax 0.25 mg twice daily as needed             -antipsychotic agents: Seroquel 25 mg nightly 5. Neuropsych: This patient is not capable of making decisions on his own behalf. 6. Skin/Wound Care: will apply silver nitrate to trach stoma again 10/21 7. Fluids/Electrolytes/Nutrition: Routine in and outs 8.  VDRF.  Status post tracheostomy tube 01/14/2021.  Decannulated 02/26/2021  9.  Atrial fibrillation with RVR.  Follow-up cardiology services.  Continue Amiodarone 200 mg daily, Lopressor 12.5 mg twice daily.               Monitor with increased activity  Vitals:   03/11/21  2002 03/12/21 0628  BP: 125/72 120/68  Pulse: 68 (!) 56  Resp: 18 16  Temp: 98.2 F (36.8 C) 98.1 F (36.7 C)  SpO2: 99% 100%    10.  Diabetes mellitus with hyperglycemia.  Currently on Semglee 16 units twice daily.  Check blood sugars before meals and at bedtime.  Patient on Glucotrol 2.5 mg daily, Tradjenta 5 mg daily, Glucophage 1000 mg twice daily, Actos 45 mg daily prior to admission.  Resume oral agents as possible   CBG (last 3)  Recent Labs    03/11/21 1630 03/11/21 2122 03/12/21 0633  GLUCAP 202* 157* 72   Glucotrol resumed 10/19   Actos 15mg  daily resumed  Semglee decreased to 15 on 10/24  Metformin 500 daily started on 10/25  Labile on 10/25 11.  Acute blood loss anemia.  Continue iron supplement.               Hemoglobin 8.3 on 10/24 -still awaiting stool sample for OB on 10/25 12.  Obesity.  BMI 32.74.  Dietary follow-up 13.  Slow transit constipation.  .  Bowel meds increased on 10/15, received sorbitol 14.?  OSA  Continue Hepburn nightly, continues to require on 10/25 15.  Hx of right middle ear hemorrhage, temporal and occipital fx-  -cipro ear gtts     LOS: 11 days A FACE TO FACE EVALUATION WAS PERFORMED  Angel [name] 03/12/2021, 10:02 AM

## 2021-03-13 LAB — GLUCOSE, CAPILLARY
Glucose-Capillary: 68 mg/dL — ABNORMAL LOW (ref 70–99)
Glucose-Capillary: 101 mg/dL — ABNORMAL HIGH (ref 70–99)
Glucose-Capillary: 132 mg/dL — ABNORMAL HIGH (ref 70–99)
Glucose-Capillary: 173 mg/dL — ABNORMAL HIGH (ref 70–99)
Glucose-Capillary: 158 mg/dL — ABNORMAL HIGH (ref 70–99)

## 2021-03-13 MED ORDER — POLYETHYLENE GLYCOL 3350 17 G PO PACK: 17.0000 g | PACK | Freq: Two times a day (BID) | ORAL | 0 refills | Status: DC

## 2021-03-13 MED ORDER — METOPROLOL TARTRATE 25 MG PO TABS: 12.5000 mg | ORAL_TABLET | Freq: Two times a day (BID) | ORAL | 0 refills | Status: DC

## 2021-03-13 MED ORDER — DOCUSATE SODIUM 100 MG PO CAPS: 100.0000 mg | ORAL_CAPSULE | Freq: Two times a day (BID) | ORAL | 0 refills | Status: DC

## 2021-03-13 MED ORDER — ROSUVASTATIN CALCIUM 10 MG PO TABS: 10.0000 mg | ORAL_TABLET | Freq: Every day | ORAL | 0 refills | Status: AC

## 2021-03-13 MED ORDER — PIOGLITAZONE HCL 45 MG PO TABS: 45.0000 mg | ORAL_TABLET | Freq: Every day | ORAL | 0 refills | Status: DC

## 2021-03-13 MED ORDER — ALPRAZOLAM 0.25 MG PO TABS
0.2500 mg | ORAL_TABLET | Freq: Two times a day (BID) | ORAL | 0 refills | Status: DC | PRN
Start: 2021-03-13 — End: 2021-05-03

## 2021-03-13 MED ORDER — APIXABAN 5 MG PO TABS: 5.0000 mg | ORAL_TABLET | Freq: Two times a day (BID) | ORAL | 1 refills | Status: AC

## 2021-03-13 MED ORDER — OMEPRAZOLE 20 MG PO CPDR: 20.0000 mg | DELAYED_RELEASE_CAPSULE | Freq: Every day | ORAL | 0 refills | Status: AC

## 2021-03-13 MED ORDER — INSULIN ASPART 100 UNIT/ML IJ SOLN
0.0000 [IU] | Freq: Three times a day (TID) | INTRAMUSCULAR | Status: DC
  Administered 2021-03-13: 3 [IU] via SUBCUTANEOUS
  Administered 2021-03-14: 5 [IU] via SUBCUTANEOUS

## 2021-03-13 MED ORDER — AMIODARONE HCL 200 MG PO TABS: 200.0000 mg | ORAL_TABLET | Freq: Every day | ORAL | 0 refills | Status: DC

## 2021-03-13 MED ORDER — GLIPIZIDE ER 2.5 MG PO TB24: 2.5000 mg | ORAL_TABLET | Freq: Every day | ORAL | 0 refills | Status: AC

## 2021-03-13 MED ORDER — GABAPENTIN 300 MG PO CAPS: 300.0000 mg | ORAL_CAPSULE | Freq: Two times a day (BID) | ORAL | 0 refills | Status: DC

## 2021-03-13 MED ORDER — QUETIAPINE FUMARATE 25 MG PO TABS: 25.0000 mg | ORAL_TABLET | Freq: Every day | ORAL | 0 refills | Status: AC

## 2021-03-13 MED ORDER — OXYCODONE HCL 5 MG PO TABS: 5.0000 mg | ORAL_TABLET | ORAL | 0 refills | Status: DC | PRN

## 2021-03-13 MED ORDER — METFORMIN HCL 1000 MG PO TABS: 1000.0000 mg | ORAL_TABLET | Freq: Two times a day (BID) | ORAL | 0 refills | Status: AC

## 2021-03-13 MED ORDER — INSULIN GLARGINE-YFGN 100 UNIT/ML ~~LOC~~ SOLN
10.0000 [IU] | Freq: Two times a day (BID) | SUBCUTANEOUS | Status: DC
  Administered 2021-03-13 – 2021-03-14 (×2): 10 [IU] via SUBCUTANEOUS
  Filled 2021-03-13 (×3): qty 0.1

## 2021-03-13 MED ORDER — ACETAMINOPHEN 325 MG PO TABS: 650.0000 mg | ORAL_TABLET | Freq: Four times a day (QID) | ORAL | Status: AC | PRN

## 2021-03-13 MED ORDER — ASCORBIC ACID 500 MG PO TABS: 500.0000 mg | ORAL_TABLET | Freq: Two times a day (BID) | ORAL | 0 refills | Status: AC

## 2021-03-13 MED ORDER — METHOCARBAMOL 500 MG PO TABS: 1000.0000 mg | ORAL_TABLET | Freq: Three times a day (TID) | ORAL | 0 refills | Status: DC

## 2021-03-13 MED ORDER — FERROUS SULFATE 325 (65 FE) MG PO TABS: 325.0000 mg | ORAL_TABLET | Freq: Two times a day (BID) | ORAL | 3 refills | Status: AC

## 2021-03-13 NOTE — Significant Event (Signed)
Hypoglycemic Event  CBG: 68  Treatment: 8 oz juice/soda  Symptoms: Shaky  Follow-up CBG: Time:643 CBG Result:101  Possible Reasons for Event: Medication regimen:    Night time Novolog coverage.   Comments/MD notified: Night time snack provided at time of Insulin administration.    Cotina Freedman Cheri Kearns

## 2021-03-13 NOTE — Progress Notes (Signed)
[place] PHYSICAL MEDICINE & REHABILITATION PROGRESS NOTE  Subjective/Complaints: Patient seen sitting up in his chair this morning.  He states he slept well overnight.  Family at bedside with questions regarding stoma care.  Patient with questions regarding medications at discharge.  ROS: Denies CP, SOB, N/V/D  Objective: Vital Signs: Blood pressure 120/72, pulse 63, temperature 97.7 F (36.5 C), resp. rate 14, height 5\' 8"  (1.727 m), weight 121.6 kg, SpO2 97 %. No results found. Recent Labs    03/11/21 0548  WBC 4.8  HGB 8.3*  HCT 27.2*  PLT 212     Recent Labs    03/11/21 0548  NA 139  K 3.6  CL 101  CO2 29  GLUCOSE 73  BUN 21  CREATININE 1.15  CALCIUM 8.7*      Intake/Output Summary (Last 24 hours) at 03/13/2021 1313 Last data filed at 03/13/2021 1300 Gross per 24 hour  Intake 600 ml  Output --  Net 600 ml         Physical Exam: BP 120/72 (BP Location: Right Arm)   Pulse 63   Temp 97.7 F (36.5 C)   Resp 14   Ht 5\' 8"  (1.727 m)   Wt 121.6 kg   SpO2 97%   BMI 40.75 kg/m  Constitutional: No distress . Vital signs reviewed. HENT: Normocephalic.  Atraumatic. Eyes: EOMI. No discharge. Cardiovascular: No JVD.  RRR. Respiratory: Normal effort.  No stridor.  Bilateral clear to auscultation. GI: Non-distended.  BS +. Skin: Warm and dry.  Intact. Psych: Normal mood.  Normal behavior. Musc: No edema in extremities.  No tenderness in extremities. Neuro: Alert and oriented Improved insight and awareness.   Follows all simple commands.   Motor: 5/5 in BUE , 4/5 in BLE prox to distal, improving  Assessment/Plan: 1. Functional deficits which require 3+ hours per day of interdisciplinary therapy in a comprehensive inpatient rehab setting. Physiatrist is providing close team supervision and 24 hour management of active medical problems listed below. Physiatrist and rehab team continue to assess barriers to discharge/monitor patient progress toward  functional and medical goals   Care Tool:  Bathing    Body parts bathed by patient: Right arm, Chest, Abdomen, Front perineal area, Right upper leg, Left upper leg, Face   Body parts bathed by helper: Left arm, Buttocks, Right lower leg, Left lower leg     Bathing assist Assist Level: Moderate Assistance - Patient 50 - 74%     Upper Body Dressing/Undressing Upper body dressing   What is the patient wearing?: Pull over shirt    Upper body assist Assist Level: Moderate Assistance - Patient 50 - 74%    Lower Body Dressing/Undressing Lower body dressing      What is the patient wearing?: Pants, Incontinence brief     Lower body assist Assist for lower body dressing: Total Assistance - Patient < 25%     Toileting Toileting    Toileting assist Assist for toileting: Total Assistance - Patient < 25%     Transfers Chair/bed transfer  Transfers assist     Chair/bed transfer assist level: Supervision/Verbal cueing Chair/bed transfer assistive device: Programmer, multimedia   Ambulation assist      Assist level: Supervision/Verbal cueing Assistive device: Walker-rolling Max distance: >200 ft   Walk 10 feet activity   Assist  Walk 10 feet activity did not occur: Safety/medical concerns  Assist level: Supervision/Verbal cueing Assistive device: Walker-rolling   Walk 50 feet activity   Assist  Walk 50 feet with 2 turns activity did not occur: Safety/medical concerns  Assist level: Supervision/Verbal cueing Assistive device: Walker-rolling    Walk 150 feet activity   Assist Walk 150 feet activity did not occur: Safety/medical concerns  Assist level: Supervision/Verbal cueing Assistive device: Walker-rolling    Walk 10 feet on uneven surface  activity   Assist Walk 10 feet on uneven surfaces activity did not occur: Safety/medical concerns         Wheelchair     Assist Is the patient using a wheelchair?: Yes Type of Wheelchair:  Manual    Wheelchair assist level: Total Assistance - Patient < 25% Max wheelchair distance: >200 ft    Wheelchair 50 feet with 2 turns activity    Assist        Assist Level: Total Assistance - Patient < 25%   Wheelchair 150 feet activity     Assist      Assist Level: Total Assistance - Patient < 25%  BP 120/72 (BP Location: Right Arm)   Pulse 63   Temp 97.7 F (36.5 C)   Resp 14   Ht 5\' 8"  (1.727 m)   Wt 121.6 kg   SpO2 97%   BMI 40.75 kg/m   Medical Problem List and Plan: 1.  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  Antithrombotics: -DVT/anticoagulation:  Pharmaceutical: Other (comment)/Eliquis for pulmonary emboli             -antiplatelet therapy: N/A 3. Pain: continue Neurontin 300 mg twice daily, Robaxin 1000 mg every 8 hours, oxycodone as needed             Monitor for headaches with increased exertion  Controlled with meds on 10/26 4. Anxiety: continue Xanax 0.25 mg twice daily as needed             -antipsychotic agents: Seroquel 25 mg nightly 5. Neuropsych: This patient is not capable of making decisions on his own behalf. 6. Skin/Wound Care: silver nitrate to trach stoma again 10/21 7. Fluids/Electrolytes/Nutrition: Routine in and outs 8.  VDRF.  Status post tracheostomy tube 01/14/2021.  Decannulated 02/26/2021  9.  Atrial fibrillation with RVR.  Follow-up cardiology services.  Continue Amiodarone 200 mg daily, Lopressor 12.5 mg twice daily.               Monitor with increased activity  Vitals:   03/12/21 2001 03/13/21 0637  BP: 139/71 120/72  Pulse: 69 63  Resp: 18 14  Temp: 98.3 F (36.8 C) 97.7 F (36.5 C)  SpO2: 99% 97%    10.  Diabetes mellitus with hyperglycemia.  Currently on Semglee 16 units twice daily.  Check blood sugars before meals and at bedtime.  Patient on Glucotrol 2.5 mg daily, Tradjenta 5 mg daily, Glucophage 1000 mg twice  daily, Actos 45 mg daily prior to admission.  Resume oral agents as possible   CBG (last 3)  Recent Labs    03/13/21 0620 03/13/21 0643 03/13/21 1136  GLUCAP 68* 101* 132*   Glucotrol resumed 10/19   Actos 15mg  daily resumed             Semglee decreased to 15 on 10/24, decreased to 10 on 10/26  Metformin 500 daily started on 10/25  Remains labile on 10/26 11.  Acute blood loss anemia.  Continue iron supplement.               Hemoglobin 8.3  on 10/24 -still awaiting stool sample for OB on 1/26 12.  Obesity.  BMI 32.74.  Dietary follow-up 13.  Slow transit constipation.  .  Bowel meds increased on 10/15, received sorbitol  Improving 14.?  OSA  Continue S[place]y nightly, continues to require on 10/25 15.  Hx of right middle ear hemorrhage, temporal and occipital fx-  -cipro ear gtts     LOS: 12 days A FACE TO FACE EVALUATION WAS PERFORMED  Angel [name] 03/13/2021, 1:13 PM

## 2021-03-13 NOTE — Progress Notes (Signed)
Physical Therapy TBI Note  Patient Details  Name: Angel [name] MRN: [id number] Date of Birth: 1950-09-15  Today's Date: 03/13/2021 PT Individual Time: 0800-0910 PT Individual Time Calculation (min): 70 min   Short Term Goals: Week 2:  PT Short Term Goal 1 (Week 2): Pt will transfer sit to stand consistently w/supervision to LRAD PT Short Term Goal 2 (Week 2): Pt will perform basic transfers w/RW and supervision safely PT Short Term Goal 3 (Week 2): Pt will ambulate 120ft w/RW and cga PT Short Term Goal 4 (Week 2): Pt will perform functional household ambulation x 60ft w/RW and cga PT Short Term Goal 5 (Week 2): Pt will maintain balance w/overhead reach and single UE support on RW w/cga and no lob  Skilled Therapeutic Interventions/Progress Updates:     Patient in w/c with his wife in the room upon PT arrival. Patient alert and agreeable to PT session. Patient denied pain during session. Patient's son arrived and his wife and son participated in hands on family education throughout session. Patient's family performed all mobility with patient at least 1 trial with safe technique following demonstration and/or cues from PT.  Therapeutic Activity: Bed Mobility: Patient performed rolling R/L and supine to/from sit with close supervision in ADL bed, guarding closely in front when coming to sitting due to elevated bed height.  Transfers: Patient performed sit to/from stand from w/c and arm chair with supervision and from mat table and ADL recliner with pillow in the seat for boosting up. Provided verbal cues for forward weight shift and reaching back to sit for safety. Patient performed a simulated mini-van height car transfer with CGA using RW. Provided cues for safe technique.  Gait Training:  Patient ambulated >150 feet, >50 feet, >100 feet, and >200 feet using RW with supervision. Ambulated with decreased gait speed, step height, and step length, narrow BOS, and mild forward trunk flexion.  Provided verbal cues for erect posture, safe proximity to RW, and and increased BOS and step length for safety. Patient ascended/descended 1x5" step using RW x2 with CGA. Performed step-to gait pattern leading with R while ascending and L while descending. Patient able to teach-back technique and sequencing, PT provided demonstration of safe guarding technique on first trial.   Wheelchair Mobility:  Patient was transported in the w/c with total A at end of session due to fatigue. Patient's family reports that they have obtained a w/c for the patient for energy conservation in the community as needed.   Educated patient and family on fall risk/prevention, home modifications to prevent falls, and activation of emergency services in the event of a fall during session. Recommended use of RW and education on mobility progression with OPPT. Educated on symptoms of TBI to lookout for and report to MD at follow-up. Patient and family appreciative of education and stated understanding.    Patient in w/c with his family in the room at end of session with breaks locked, chair alarm set, and all needs within reach.   Therapy Documentation Precautions:  Precautions Precautions: Fall Precaution Comments: watch BP, mild dizziness in standing, posterior bias in standing Restrictions Weight Bearing Restrictions: No  Agitated Behavior Scale: TBI Observation Details Observation Environment: CIR Start of observation period - Date: 03/13/21 Start of observation period - Time: 0800 End of observation period - Date: 03/13/21 End of observation period - Time: 0900 Agitated Behavior Scale (DO NOT LEAVE BLANKS) Short attention span, easy distractibility, inability to concentrate: Absent Impulsive, impatient, low tolerance for pain  or frustration: Absent Uncooperative, resistant to care, demanding: Absent Violent and/or threatening violence toward people or property: Absent Explosive and/or unpredictable anger:  Absent Rocking, rubbing, moaning, or other self-stimulating behavior: Absent Pulling at tubes, restraints, etc.: Absent Wandering from treatment areas: Absent Restlessness, pacing, excessive movement: Absent Repetitive behaviors, motor, and/or verbal: Absent Rapid, loud, or excessive talking: Absent Sudden changes of mood: Absent Easily initiated or excessive crying and/or laughter: Absent Self-abusiveness, physical and/or verbal: Absent Agitated behavior scale total score: 14    Therapy/Group: Individual Therapy  Buffi Ewton L Lorrain Rivers PT, DPT  03/13/2021, 12:52 PM

## 2021-03-13 NOTE — Progress Notes (Signed)
Patient ID: Angel [name], male   DOB: [birth year], 70 y.o.   MRN: [id number]   SW called [facility] and was informed they only offer PT and pt would need to go to the outpatient clinic at the hospital.   SW will fax outpatient PT/OT/SLP referral to [facility] (p:[phone]/f:[phone]).   Loralee Pacas, MSW, [place] Office: [phone] Cell: [phone] Fax: [phone]

## 2021-03-13 NOTE — Progress Notes (Signed)
Occupational Therapy TBI Note  Patient Details  Name: Angel [name] MRN: [id number] Date of Birth: 01/15/1951  Today's Date: 03/13/2021 OT Individual Time: 1340-1430 OT Individual Time Calculation (min): 50 min    Short Term Goals:  Week 2:  OT Short Term Goal 1 (Week 2): STG= LTG d/t ELOS  Skilled Therapeutic Interventions/Progress Updates:    Pt received sitting in the w/c with no c/o pain. Pt completed 200 ft of functional mobility to the therapy gym with (S). Pt completed SciFi Arm bike to challenge functional activity tolerance and BUE strengthening/endurance. Pt was able to complete 4x 4 min trials forward and backward. Of note, (+) drop arm test on the L with pt reporting chronic L shoulder flexion and abduction. Several rest breaks, with pt requiring cueing for rest breaks. Pt completed standing level functional stepping activity with Rw support, challenging proprioception with removing visual input by looking forward. Pt returned to his room and was left sitting up with all needs met, family present.   Therapy Documentation Precautions:  Precautions Precautions: Fall Precaution Comments: watch BP, mild dizziness in standing, posterior bias in standing Restrictions Weight Bearing Restrictions: No  Pain: Pain Assessment Pain Scale: 0-10 Pain Score: 0-No pain Agitated Behavior Scale: TBI Observation Details Observation Environment: CIR Start of observation period - Date: 03/13/21 Start of observation period - Time: 1440 End of observation period - Date: 03/13/21 End of observation period - Time: 1530 Agitated Behavior Scale (DO NOT LEAVE BLANKS) Short attention span, easy distractibility, inability to concentrate: Absent Impulsive, impatient, low tolerance for pain or frustration: Absent Uncooperative, resistant to care, demanding: Absent Violent and/or threatening violence toward people or property: Absent Explosive and/or unpredictable anger: Absent Rocking, rubbing,  moaning, or other self-stimulating behavior: Absent Pulling at tubes, restraints, etc.: Absent Wandering from treatment areas: Absent Restlessness, pacing, excessive movement: Absent Repetitive behaviors, motor, and/or verbal: Absent Rapid, loud, or excessive talking: Absent Sudden changes of mood: Absent Easily initiated or excessive crying and/or laughter: Absent Self-abusiveness, physical and/or verbal: Absent Agitated behavior scale total score: 14     Therapy/Group: Individual Therapy  Curtis Sites 03/13/2021, 3:13 PM

## 2021-03-13 NOTE — Progress Notes (Signed)
Speech Language Pathology  TBI patient Daily Session Note   Patient Details  Name: Angel [name] MRN: [id number] Date of Birth: [birth year]  Today's Date: 03/13/2021 SLP Individual Time: 1135-1200 SLP Individual Time Calculation (min): 25 min  Short Term Goals: Week 2: SLP Short Term Goal 1 (Week 2): STGs=LTGS due to ELOS  Skilled Therapeutic Interventions:   Patient seen with wife and son present for skilled ST session focused on family/patient discharge education.   Pain Pain Assessment Pain Scale: 0-10 Pain Score: 0-No pain  Therapy/Group: Individual Therapy  Pain Pain Assessment Pain Scale: 0-10 Pain Score: 0-No pain  Agitated Behavior Scale: TBI Observation Details Observation Environment: patient's room Start of observation period - Date: 03/13/21 Start of observation period - Time: 1135 End of observation period - Date: 03/13/21 End of observation period - Time: 1200 Agitated Behavior Scale (DO NOT LEAVE BLANKS) Short attention span, easy distractibility, inability to concentrate: Absent Impulsive, impatient, low tolerance for pain or frustration: Absent Uncooperative, resistant to care, demanding: Absent Violent and/or threatening violence toward people or property: Absent Explosive and/or unpredictable anger: Absent Rocking, rubbing, moaning, or other self-stimulating behavior: Absent Pulling at tubes, restraints, etc.: Absent Wandering from treatment areas: Absent Restlessness, pacing, excessive movement: Absent Repetitive behaviors, motor, and/or verbal: Absent Rapid, loud, or excessive talking: Absent Sudden changes of mood: Absent Easily initiated or excessive crying and/or laughter: Absent Self-abusiveness, physical and/or verbal: Absent Agitated behavior scale total score: [place], MA, CCC-SLP Speech Therapy

## 2021-03-13 NOTE — Progress Notes (Addendum)
Occupational Therapy TBI Note  Patient Details  Name: Angel [name] MRN: [id number] Date of Birth: Nov 03, 1950  Today's Date: 03/13/2021 OT Individual Time: 6599-3570 OT Individual Time Calculation (min): 54 min    Short Term Goals: Week 1:  OT Short Term Goal 1 (Week 1): Pt will consistently transfer with MIN A to toilet OT Short Term Goal 1 - Progress (Week 1): Met OT Short Term Goal 2 (Week 1): Pt will thread BLE into pants with AE PRN OT Short Term Goal 2 - Progress (Week 1): Met OT Short Term Goal 3 (Week 1): Pt will groom wiht set up OT Short Term Goal 3 - Progress (Week 1): Met OT Short Term Goal 4 (Week 1): Pt will don shirt wiht MIN A Week 2:  OT Short Term Goal 1 (Week 2): STG= LTG d/t ELOS  Skilled Therapeutic Interventions/Progress Updates:   Pt received in wc, family present for education with no c/o pain but reporting mild fatigue, & agreeable to OT session. Session focused on functional mobility, sit <> stands in rehab apartment from various surfaces & safety education. Pt completed functional mobility to all therapeutic destinations with CGA and RW. Pt practiced sit<>stands from couch with OT/OTS providing education on how to assist pt. Pt with min A to power up. Wife [name] provided with min instructional cuing on body mechanics during transitional movements. Pt with slight SOB, and needed strong encouragement to take rest breaks. Pt practiced transferring to TTB with RW and using gait belt to assist with lifting legs over tub ledge. Required min directional cuing. Extensive time spent discussing shower/tub options and safety precautions for potential home modifications (son to help modify bathrooms) including grab bars and shower chair vs TTB. Pt and family verbalized understanding. Demonstrated good carryover from previous sessions on RW mgmt during bathroom mobility. Wife provided CGA as pt walked back to room. Pt and family did not have further questions at this time.    Therapeutic Activity: Pt completes ball toss from trampoline in standing with overall CGA. Performed 2 sets of 13-20 repetitions. Pt required short rest breaks in between sets, with good motivation and pushing himself appropriately.   Pt left at end of session in wc with exit alarm on, family present, call light in reach and all needs met.   Therapy Documentation Precautions:  Precautions Precautions: Fall Precaution Comments: watch BP, mild dizziness in standing, posterior bias in standing Restrictions Weight Bearing Restrictions: No    Agitated Behavior Scale: TBI Observation Details Observation Environment: Pts room/rehab apt Start of observation period - Date: 03/13/21 Start of observation period - Time: 1030 End of observation period - Date: 03/13/21 End of observation period - Time: 1130 Agitated Behavior Scale (DO NOT LEAVE BLANKS) Short attention span, easy distractibility, inability to concentrate: Absent Impulsive, impatient, low tolerance for pain or frustration: Absent Uncooperative, resistant to care, demanding: Absent Violent and/or threatening violence toward people or property: Absent Explosive and/or unpredictable anger: Absent Rocking, rubbing, moaning, or other self-stimulating behavior: Absent Pulling at tubes, restraints, etc.: Absent Wandering from treatment areas: Absent Restlessness, pacing, excessive movement: Absent Repetitive behaviors, motor, and/or verbal: Absent Rapid, loud, or excessive talking: Absent Sudden changes of mood: Absent Easily initiated or excessive crying and/or laughter: Absent Self-abusiveness, physical and/or verbal: Absent Agitated behavior scale total score: 14    Therapy/Group: Individual Therapy  Glennis Montenegro 03/13/2021, 7:25 AM

## 2021-03-14 DIAGNOSIS — E162 Hypoglycemia, unspecified: Secondary | ICD-10-CM

## 2021-03-14 LAB — GLUCOSE, CAPILLARY
Glucose-Capillary: 56 mg/dL — ABNORMAL LOW (ref 70–99)
Glucose-Capillary: 73 mg/dL (ref 70–99)
Glucose-Capillary: 117 mg/dL — ABNORMAL HIGH (ref 70–99)
Glucose-Capillary: 205 mg/dL — ABNORMAL HIGH (ref 70–99)
Glucose-Capillary: 124 mg/dL — ABNORMAL HIGH (ref 70–99)

## 2021-03-14 MED ORDER — INSULIN GLARGINE-YFGN 100 UNIT/ML ~~LOC~~ SOLN
5.0000 [IU] | Freq: Two times a day (BID) | SUBCUTANEOUS | Status: DC
  Administered 2021-03-14 – 2021-03-15 (×2): 5 [IU] via SUBCUTANEOUS
  Filled 2021-03-14 (×3): qty 0.05

## 2021-03-14 NOTE — Progress Notes (Signed)
Occupational Therapy TBI Note  Patient Details  Name: Angel [name] MRN: [id number] Date of Birth: 06/05/1950  Today's Date: 03/14/2021 OT Individual Time: 0700-0800 OT Individual Time Calculation (min): 60 min  Today's Date: 03/14/2021 OT Individual Time: 1400-1430 OT Individual Time Calculation (min): 30 min    Short Term Goals: Week 1:  OT Short Term Goal 1 (Week 1): Pt will consistently transfer with MIN A to toilet OT Short Term Goal 1 - Progress (Week 1): Met OT Short Term Goal 2 (Week 1): Pt will thread BLE into pants with AE PRN OT Short Term Goal 2 - Progress (Week 1): Met OT Short Term Goal 3 (Week 1): Pt will groom wiht set up OT Short Term Goal 3 - Progress (Week 1): Met OT Short Term Goal 4 (Week 1): Pt will don shirt wiht MIN A Week 2:  OT Short Term Goal 1 (Week 2): STG= LTG d/t ELOS  Skilled Therapeutic Interventions/Progress Updates:    Pt received in wc with nursing present. Pt reported that he had not eaten breakfast and blood sugar was low.   ADL: Pt completes BADL at overall supervision level. Skilled interventions include: Pt had already finished grooming and UB dressing with supervision at sink level in wc per wife report. Pt able to don pants with use of reacher and attempts to don shoes. Pt taught how to use shoe funnel for increased independence for donning shoes. Pt verbalized that it worked well, and information about AE provided to wife.   Therapeutic activity Activities performed seated in wc d/t hypoglycemic episode. Pt performed [facility] activities with small marbles placed on cards. Pt able to place and count marbles 100% accurately without cuing. OTS grades activity to smaller objects to grasp - pt with increased difficulty but able to pick up and put small puzzles together. Mild hand tremors noted in BUE.   Pt left at end of session in wc with exit alarm on, wife present, call light in reach and all needs met.   Session II Pt received in wc, no c/o  pain and with questions regarding follow up care. Session spent answering questions and addressing several concerns of pt. Pt very thankful for therapy and is motivated to get back to life at home with his family and grandchildren. Pt with some concerns about continuity of care and medication management.   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 Pt verbalized understanding that he is not to drive or operate motorized machinery without MD consent. Pt reported blurry vision prior to hospitalization and says it seems like he needs new glasses. Declined need for further OT visual assessment. Pt aware to mention changes to MD. Pt left at end of session in wc with exit alarm on, call light in reach and all needs met     03/14/21 1459  Observation Details  Observation Environment Pts room/gym  Start of observation period - Date 03/14/21  Start of observation period - Time 0700  End of observation period - Date 03/14/21  End of observation period - Time 0800  Agitated Behavior Scale (DO NOT LEAVE BLANKS)  Short attention span, easy distractibility, inability to concentrate 1  Impulsive, impatient, low tolerance for pain or frustration 1  Uncooperative, resistant to care, demanding 1  Violent and/or threatening violence toward people or property 1  Explosive and/or unpredictable anger 1  Rocking, rubbing, moaning, or other self-stimulating behavior 1  Pulling at tubes, restraints, etc.  1  Wandering from treatment areas 1  Restlessness, pacing, excessive movement 1  Repetitive behaviors, motor, and/or verbal 1  Rapid, loud, or excessive talking 1  Sudden changes of mood 1  Easily initiated or excessive crying and/or laughter 1  Self-abusiveness, physical and/or verbal 1  Agitated behavior scale total score 14     03/14/21 1507  Observation Details  Observation Environment Pts room  Start of observation period - Date  03/14/21  Start of observation period - Time 1400  End of observation period - Date 03/14/21  End of observation period - Time 1430  Agitated Behavior Scale (DO NOT LEAVE BLANKS)  Short attention span, easy distractibility, inability to concentrate 1  Impulsive, impatient, low tolerance for pain or frustration 1  Uncooperative, resistant to care, demanding 1  Violent and/or threatening violence toward people or property 1  Explosive and/or unpredictable anger 1  Rocking, rubbing, moaning, or other self-stimulating behavior 1  Pulling at tubes, restraints, etc. 1  Wandering from treatment areas 1  Restlessness, pacing, excessive movement 1  Repetitive behaviors, motor, and/or verbal 1  Rapid, loud, or excessive talking 1  Sudden changes of mood 1  Easily initiated or excessive crying and/or laughter 1  Self-abusiveness, physical and/or verbal 1  Agitated behavior scale total score 14    Therapy Documentation Precautions:  Precautions Precautions: Fall Precaution Comments: watch BP, mild dizziness in standing, posterior bias in standing Restrictions Weight Bearing Restrictions: No  Therapy/Group: Individual Therapy  Alanya Vukelich 03/14/2021, 12:14 PM

## 2021-03-14 NOTE — Progress Notes (Signed)
Patient ID: Angel [name], male   DOB: 21-Oct-1950, 70 y.o.   MRN: [id number]  SW spoke with [name] outpatient PT/OT/SLP referral (p:[phone]/f:[phone]) to verify if referral received. Confirms referral was received and will follow-up with family on Monday.  *SW followed up with pt wife to discuss above. States she has access to RW which was given to her by a neighbor. Intends to purchase be rails tomorrow, and trying to accommodate the shower to make sure it is safe for him. Considering a TTB for their bathroom to see if this will be safe.   Loralee Pacas, MSW, [place] Office: [phone] Cell: [phone] Fax: [phone]

## 2021-03-14 NOTE — Plan of Care (Signed)
  Problem: RH Balance Goal: LTG: Patient will maintain dynamic sitting balance (OT) Description: LTG:  Patient will maintain dynamic sitting balance with assistance during activities of daily living (OT) Outcome: Completed/Met Goal: LTG Patient will maintain dynamic standing with ADLs (OT) Description: LTG:  Patient will maintain dynamic standing balance with assist during activities of daily living (OT)  Outcome: Completed/Met   Problem: RH Grooming Goal: LTG Patient will perform grooming w/assist,cues/equip (OT) Description: LTG: Patient will perform grooming with assist, with/without cues using equipment (OT) Outcome: Completed/Met   Problem: RH Bathing Goal: LTG Patient will bathe all body parts with assist levels (OT) Description: LTG: Patient will bathe all body parts with assist levels (OT) Outcome: Completed/Met   Problem: RH Dressing Goal: LTG Patient will perform upper body dressing (OT) Description: LTG Patient will perform upper body dressing with assist, with/without cues (OT). Outcome: Completed/Met Goal: LTG Patient will perform lower body dressing w/assist (OT) Description: LTG: Patient will perform lower body dressing with assist, with/without cues in positioning using equipment (OT) Outcome: Completed/Met   Problem: RH Toileting Goal: LTG Patient will perform toileting task (3/3 steps) with assistance level (OT) Description: LTG: Patient will perform toileting task (3/3 steps) with assistance level (OT)  Outcome: Completed/Met   Problem: RH Functional Use of Upper Extremity Goal: LTG Patient will use RT/LT upper extremity as a (OT) Description: LTG: Patient will use right/left upper extremity as a stabilizer/gross assist/diminished/nondominant/dominant level with assist, with/without cues during functional activity (OT) Outcome: Completed/Met   Problem: RH Toilet Transfers Goal: LTG Patient will perform toilet transfers w/assist (OT) Description: LTG: Patient  will perform toilet transfers with assist, with/without cues using equipment (OT) Outcome: Completed/Met   Problem: RH Tub/Shower Transfers Goal: LTG Patient will perform tub/shower transfers w/assist (OT) Description: LTG: Patient will perform tub/shower transfers with assist, with/without cues using equipment (OT) Outcome: Completed/Met   Problem: RH Memory Goal: LTG Patient will demonstrate ability for day to day recall/carry over during activities of daily living with assistance level (OT) Description: LTG:  Patient will demonstrate ability for day to day recall/carry over during activities of daily living with assistance level (OT). Outcome: Completed/Met   Problem: RH Attention Goal: LTG Patient will demonstrate this level of attention during functional activites (OT) Description: LTG:  Patient will demonstrate this level of attention during functional activites  (OT) Outcome: Completed/Met

## 2021-03-14 NOTE — Progress Notes (Signed)
Physical Therapy Discharge Summary  Patient Details  Name: Angel [name] MRN: [id number] Date of Birth: 02/15/1951  Today's Date: 03/14/2021 PT Individual Time: 0905-1015 PT Individual Time Calculation (min): 70 min    Patient has met 9 of 10 long term goals due to improved activity tolerance, improved balance, improved postural control, increased strength, increased range of motion, decreased pain, ability to compensate for deficits, improved attention, improved awareness, and improved coordination.  Patient to discharge at an ambulatory level Supervision-min A.   Patient's care partner is independent to provide the necessary physical and cognitive assistance at discharge.  Reasons goals not met: Patient requires min A-CGA for furniture transfers   Recommendation:  Patient will benefit from ongoing skilled PT services in outpatient setting to continue to advance safe functional mobility, address ongoing impairments in balance, strength, activity tolerance, functional mobility, community integration, safety awareness, patient/caregiver education, and minimize fall risk.  Equipment: RW and w/c obtained by family   Reasons for discharge: treatment goals met  Patient/family agrees with progress made and goals achieved: Yes  Skilled Therapeutic Intervention: Patient in w/c with his wife in the room upon PT arrival. Patient alert and agreeable to PT session. Patient reported 2-3/10 R hand pain along ulnar distribution with "coldness" during session, RN made aware. PT provided repositioning, rest breaks, and distraction as pain interventions throughout session.   Focused session on d/c assessment, see below, patient education, and implementation of HEP. Educated on follow-up for visual deficits and R hand neuropathic pain, energy conservation, maintaining a daily schedule, reviewing medications with PA prior to d/c, and benefit of continued mental and physical exercise following OP therapies.  Patient receptive to all education.   Introduced HEP for [employer] exercises to address strength and balance and provided handout. Patient performed 1 set of SLS x10 sec, tandem stance x10 sec, backwards walking, side-stepping, figure eight walking, sit to stand x5, and mini squats x10 using RW with supervision. Educated on progression of exercises.   Patient in w/c in the room at end of session with breaks locked, seat belt alarm set, and all needs within reach.   PT Discharge Precautions/Restrictions Precautions Precautions: Fall Restrictions Weight Bearing Restrictions: No Pain Interference Pain Interference Pain Effect on Sleep: 2. Occasionally Pain Interference with Therapy Activities: 1. Rarely or not at all Pain Interference with Day-to-Day Activities: 2. Occasionally Vision/Perception  Vision - History Ability to See in Adequate Light: 0 Adequate Vision - Assessment Eye Alignment: Impaired (comment) (Mild L exotropia) Tracking/Visual Pursuits: Decreased smoothness of vertical tracking;Decreased smoothness of eye movement to LEFT superior field;Decreased smoothness of eye movement to LEFT inferior field;Decreased smoothness of eye movement to RIGHT inferior field;Requires cues, head turns, or add eye shifts to track;Decreased smoothness of eye movement to RIGHT superior field Saccades: Additional eye shifts occurred during testing;Additional head turns occurred during testing Convergence: Impaired (comment) Perception Perception: Within Functional Limits Praxis Praxis: Intact  Cognition Overall Cognitive Status: Impaired/Different from baseline Arousal/Alertness: Awake/alert Orientation Level: Oriented X4 Year: 2022 Month: October Day of Week: Correct Attention: Divided Focused Attention: Appears intact Alternating Attention: Appears intact Divided Attention: Impaired Divided Attention Impairment: Functional complex Memory: Impaired Memory Impairment: Decreased short  term memory Decreased Short Term Memory: Functional complex;Verbal complex Awareness: Impaired Awareness Impairment: Anticipatory impairment Problem Solving: Impaired Problem Solving Impairment: Functional complex Safety/Judgment: Impaired [place] [employer] Scales of Cognitive Functioning: Purposeful/appropriate Sensation Sensation Light Touch: Impaired Detail Peripheral sensation comments: numbness, "coldness," and pain along ulnar distribution of R hand, reports this  was mild PTA and has progressed during his stay, reports R hand tremor at baseline that has decreased since admission Light Touch Impaired Details: Impaired RUE Coordination Gross Motor Movements are Fluid and Coordinated: No Fine Motor Movements are Fluid and Coordinated: No Heel Shin Test: limited by body habitus and reduced hip IR Motor  Motor Motor: Abnormal postural alignment and control Motor - Discharge Observations: decreased lower extremity strength and postural control limiting functional mobility and balance  Mobility Bed Mobility Bed Mobility: Rolling Right;Rolling Left;Supine to Sit;Sit to Supine Rolling Right: Independent Rolling Left: Independent Supine to Sit: Supervision/Verbal cueing Sit to Supine: Supervision/Verbal cueing Transfers Sit to Stand: Minimal Assistance - Patient > 75%;Contact Guard/Touching assist Stand to Sit: Supervision/Verbal cueing Stand Pivot Transfers: Supervision/Verbal cueing Transfer (Assistive device): Rolling walker Locomotion  Gait Ambulation: Yes Gait Assistance: Supervision/Verbal cueing Gait Distance (Feet): 200 Feet Assistive device: Rolling walker Gait Assistance Details: Verbal cues for precautions/safety Gait Gait: Yes Gait Pattern: Impaired Gait Pattern: Decreased step length - right;Decreased step length - left;Step-through pattern;Trunk flexed;Decreased trunk rotation Gait velocity: decreased Stairs / Additional Locomotion Stairs: Yes Stairs  Assistance: Minimal Assistance - Patient > 75%;Contact Guard/Touching assist Stair Management Technique: Two rails Number of Stairs: 4 Height of Stairs: 6 Curb: Contact Guard/Touching assist (with Rw) Wheelchair Mobility Wheelchair Mobility: No  Trunk/Postural Assessment  Cervical Assessment Cervical Assessment: Exceptions to [facility] (forward head) Thoracic Assessment Thoracic Assessment: Exceptions to [facility] (rounded shoulders) Lumbar Assessment Lumbar Assessment: Exceptions to [facility] (posterior pelvic tilt) Postural Control Postural Control: Deficits on evaluation (decreased/delayed)  Balance Standardized Balance Assessment Standardized Balance Assessment: Berg Balance Test Berg Balance Test Sit to Stand: Able to stand using hands after several tries Standing Unsupported: Able to stand 2 minutes with supervision Sitting with Back Unsupported but Feet Supported on Floor or Stool: Able to sit safely and securely 2 minutes Stand to Sit: Controls descent by using hands Transfers: Needs one person to assist Standing Unsupported with Eyes Closed: Able to stand 10 seconds with supervision Standing Ubsupported with Feet Together: Needs help to attain position but able to stand for 30 seconds with feet together From Standing, Reach Forward with Outstretched Arm: Loses balance while trying/requires external support From Standing Position, Pick up Object from Floor: Unable to try/needs assist to keep balance From Standing Position, Turn to Look Behind Over each Shoulder: Needs supervision when turning Turn 360 Degrees: Needs assistance while turning Standing Unsupported, Alternately Place Feet on Step/Stool: Needs assistance to keep from falling or unable to try Standing Unsupported, One Foot in Front: Needs help to step but can hold 15 seconds Standing on One Leg: Tries to lift leg/unable to hold 3 seconds but remains standing independently Total Score: 20 Static Sitting Balance Static Sitting -  Level of Assistance: 7: Independent Dynamic Sitting Balance Dynamic Sitting - Level of Assistance: 7: Independent Static Standing Balance Static Standing - Balance Support: No upper extremity supported Static Standing - Level of Assistance: 5: Stand by assistance Dynamic Standing Balance Dynamic Standing - Balance Support: Right upper extremity supported;Left upper extremity supported Dynamic Standing - Level of Assistance: 5: Stand by assistance Extremity Assessment  RLE Assessment RLE Assessment: Exceptions to [facility] RLE Strength RLE Overall Strength: Deficits Right Hip Flexion: 4+/5 Right Hip Extension: 4/5 Right Hip ABduction: 5/5 Right Hip ADduction: 5/5 Right Knee Flexion: 4/5 Right Knee Extension: 5/5 Right Ankle Dorsiflexion: 4+/5 Right Ankle Plantar Flexion: 5/5 LLE Assessment LLE Assessment: Exceptions to [facility] LLE Strength LLE Overall Strength: Deficits Left Hip Flexion: 4+/5  Left Hip ABduction: 5/5 Left Hip ADduction: 5/5 Left Knee Flexion: 4/5 Left Knee Extension: 5/5 Left Ankle Dorsiflexion: 4-/5 Left Ankle Plantar Flexion: 5/5    Rand Etchison L Adream Parzych PT, DPT  03/14/2021, 10:25 AM

## 2021-03-14 NOTE — Progress Notes (Signed)
Inpatient Rehabilitation Discharge Medication Review by a Pharmacist  A complete drug regimen review was completed for this patient to identify any potential clinically significant medication issues.  High Risk Drug Classes Is patient taking? Indication by Medication  Antipsychotic Yes Seroquel- sleep  Anticoagulant Yes Apixaban- PE/A fib  Antibiotic No   Opioid Yes  Oxycodone- pain  Antiplatelet No   Hypoglycemics/insulin Yes Insulin/metformin/ actos/ glipizide- T2DM  Vasoactive Medication Yes Amiodarone/ lopressor- A. Fib/HTN  Chemotherapy No   Other No      Type of Medication Issue Identified Description of Issue Recommendation(s)  Drug Interaction(s) (clinically significant)     Duplicate Therapy     Allergy     No Medication Administration End Date     Incorrect Dose     Additional Drug Therapy Needed     Significant med changes from prior encounter (inform family/care partners about these prior to discharge). Start: amiodarone/apixaban/ascorbic acid/ ferrous sulfate/ gabapentin/ robaxin/ lopressor/ Miralax/ Seroquel/ Senna/alprazolam/ Stop- quinipril/ bASA/ Ibuprofen/ linagliptin/allopurinol Educate patient on discharge  Other       Clinically significant medication issues were identified that warrant physician communication and completion of prescribed/recommended actions by midnight of the next day:  No  Name of provider notified for urgent issues identified:   Provider Method of Notification:     Pharmacist comments:   Time spent performing this drug regimen review (minutes):  30   [name] BS, PharmD, BCPS Clinical Pharmacist 03/14/2021 3:23 PM

## 2021-03-14 NOTE — Progress Notes (Signed)
Speech Language Pathology Discharge Summary  Patient Details  Name: Angel [name] MRN: [id number] Date of Birth: August 30, 1950  Today's Date: 03/14/2021 SLP Individual Time: 1100-1130 SLP Individual Time Calculation (min): 30 min   Skilled Therapeutic Interventions:  Skilled treatment session focused on cognitive goals. SLP facilitated session by readministered the [facility] Mental Status Examination (SLUMS). Patient scored  30/30 points with a score of 27 or above considered normal. Patient's score improved since initial evaluation in which he scored 25/30 points. SLP also facilitated session by providing education regarding strategies to utilize at home to maximize overall safety. Patient verbalized understanding and agreement. Patient left upright in the wheelchair with alarm on and all needs within reach.    Patient has met 3 of 3 long term goals.  Patient to discharge at overall Supervision level.   Reasons goals not met: N/A   Clinical Impression/Discharge Summary: Patient has made excellent gains and has met 3 of 3 LTGs this admission. Currently, patient demonstrates behaviors consistent with a Rancho Level VIII and requires overall supervision level verbal cues to complete functional and mildly complex tasks safely in regards to functional problem solving, recall of functional information and alternating attention. Patient and family education is complete and patient will discharge home with 24 hour supervision from family. Patient would benefit from f/u SLP services to maximize his cognitive functioning and overall functional independence in order to reduce caregiver burden.   Care Partner:  Caregiver Able to Provide Assistance: Yes  Type of Caregiver Assistance: Physical;Cognitive  Recommendation:  24 hour supervision/assistance;Outpatient SLP  Rationale for SLP Follow Up: Maximize cognitive function and independence   Equipment: N/A   Reasons for discharge: Discharged  from hospital;Treatment goals met   Patient/Family Agrees with Progress Made and Goals Achieved: Yes    [place], [place] 03/14/2021, 6:19 AM

## 2021-03-14 NOTE — Progress Notes (Signed)
Patient blood sugar at HS 03/12/21 was 158. Semglee 10 units administered per order and HS snack provided. This morning blood sugar was 56. Patient is coherent and able to take food/juice po. Nurse administered orange juice and food. Blood sugar rechecked at 0710 and sugar is now 73.

## 2021-03-14 NOTE — Progress Notes (Signed)
Inpatient Rehabilitation Care Coordinator Assessment and Plan Patient Details  Name: Angel [name] MRN: [id number] Date of Birth: [birth year]  Today's Date: 03/14/2021  Hospital Problems: Principal Problem:   TBI (traumatic brain injury) Active Problems:   Supplemental oxygen dependent   Labile blood glucose   Acute pulmonary embolism without acute cor pulmonale (HCC)   Controlled type 2 diabetes mellitus with hyperglycemia, with long-term current use of insulin ([place])   Hypoglycemia  Past Medical History:  Past Medical History:  Diagnosis Date   DM (diabetes mellitus) ([place])    HLD (hyperlipidemia)    Hypertension    Past Surgical History:  Past Surgical History:  Procedure Laterality Date   IR FLUORO GUIDE CV LINE RIGHT  02/04/2021   IR REMOVAL TUN CV CATH W/O FL  02/14/2021   IR US GUIDE VASC ACCESS RIGHT  02/04/2021   TRACHEOSTOMY TUBE PLACEMENT N/A 01/14/2021   Procedure: TRACHEOSTOMY;  Surgeon: Jesusita Oka, MD;  Location: [facility];  Service: General;  Laterality: N/A;   Social History:  reports that he has never smoked. He has never used smokeless tobacco. No history on file for alcohol use and drug use.  Family / Support Systems Marital Status: Married How Long?: 50 years Patient Roles: Spouse, Parent Spouse/Significant Other: Roselyn Reef (wife): [phone] Children: 2 adult children- both live locally Other Supports: none reported Anticipated Caregiver: Wife Ability/Limitations of Caregiver: Wife provide 24/7 care and their son and dtr will provide PRN support. Pt son to assist since he is taking FMLA. Caregiver Availability: 24/7 Family Dynamics: Pt lives with his wife.  Social History Preferred language: English Religion:  Cultural Background: Pt worked at [employer] until retirement Education: [place] - How often do you need to have someone help you when you read instructions, pamphlets, or other written material from your doctor or  pharmacy?: Never Writes: Yes Employment Status: Retired Date Retired/Disabled/Unemployed: 2010 Age Retired: 58 Public relations account executive Issues: Denies Guardian/Conservator: N/A   Abuse/Neglect Abuse/Neglect Assessment Can Be Completed: Yes Physical Abuse: Denies Verbal Abuse: Denies Sexual Abuse: Denies Exploitation of patient/patient's resources: Denies Self-Neglect: Denies  Patient response to: Social Isolation - How often do you feel lonely or isolated from those around you?: Never  Emotional Status Pt's affect, behavior and adjustment status: Pt in good spirits at time of visit. Recent Psychosocial Issues: Denies Psychiatric History: Denies Substance Abuse History: Pt reports he quit smoking in 1999 du eto health reasons. Rarely drinks EtoH. Denies reec drug use.  Patient / Family Perceptions, Expectations & Goals Pt/Family understanding of illness & functional limitations: Pt and family have a general understanding of pt care needs. Premorbid pt/family roles/activities: Independent Anticipated changes in roles/activities/participation: Assistance with ADLs/IADLs Pt/family expectations/goals: pt goal is "walking and get on my feet."  [employer]: None Premorbid Home Care/DME Agencies: None Transportation available at discharge: Wife Is the patient able to respond to transportation needs?: Yes In the past 12 months, has lack of transportation kept you from medical appointments or from getting medications?: No In the past 12 months, has lack of transportation kept you from meetings, work, or from getting things needed for daily living?: No Resource referrals recommended: Neuropsychology  Discharge Planning Living Arrangements: Spouse/significant other Support Systems: Spouse/significant other, Children Type of Residence: Private residence Insurance Resources: Multimedia programmer (specify) ([employer]) Financial Resources: Charity fundraiser Screen Referred: No Living Expenses: Medical laboratory scientific officer Management: Patient, Spouse Does the patient have any problems obtaining your medications?: No Home Management:  Pt wife managed all home care needs Patient/Family Preliminary Plans: No changes Care Coordinator Barriers to Discharge: Decreased caregiver support, Lack of/limited family support Care Coordinator Anticipated Follow Up Needs: HH/OP  Clinical Impression SW met with pt and pt wife to introduce self, explain role, and discuss discharge process. Pt is not a English as a second language teacher. No HCPOA. DME: access to RW and has railing for toilet.   Jonathyn Carothers A Shavonte Zhao 03/14/2021, 3:01 PM

## 2021-03-14 NOTE — Progress Notes (Signed)
Occupational Therapy Discharge Summary  Patient Details  Name: Angel [name] MRN: [id number] Date of Birth: Jan 24, 1951  Angel [name] has improved during his time at [facility] from mod-max BADLs at eval to discharging at overall supervision level for BADLs and is completing functional mobility with a RW. He is currently still experiencing deficits in vision, [place] BUE, L shoulder ROM and activity tolerance. He has been incredibly motivated during therapy sessions and pushes himself appropriately. His wife & son are working on modifying their bathroom for increased safety and have been provided with AE recommendations for further independence (sock aid, shoe funnel, reacher, TTB). Pt has been educated on continued mental and physical exercise after d/c. Pt and family receptive to all education provided. Angel [name] is demonstrating behaviors consistent with a RLA VIII. Overall, he would benefit from OP therapies to continue to improve in functional areas and to participate in valued activities with family. Patient and family education has been completed at this time.   Patient has met 12 of 12 long term goals due to improved activity tolerance, improved balance, postural control, ability to compensate for deficits, functional use of  RIGHT upper and LEFT upper extremity, improved attention, improved awareness, and improved coordination.  Patient to discharge at overall Supervision level.  Patient's care partner is independent to provide the necessary physical and cognitive assistance at discharge.    Reasons goals not met: N/A  Recommendation:  Patient will benefit from ongoing skilled OT services in outpatient setting to continue to advance functional skills in the area of BADL, iADL, and Reduce care partner burden.  Equipment: N/A  Reasons for discharge: treatment goals met and discharge from hospital  Patient/family agrees with progress made and goals achieved: Yes  OT Discharge Precautions/Restrictions   Precautions Precautions: Fall Precaution Comments:  (posterior bias in standing) Restrictions Weight Bearing Restrictions: No General   Vital Signs Therapy Vitals Temp: 98.4 F (36.9 C) Temp Source: Oral Pulse Rate: 65 Resp: 18 BP: (!) 145/75 Patient Position (if appropriate): Sitting Oxygen Therapy SpO2: 95 % O2 Device: Room Air Pain  No pain reported ADL ADL Eating: Set up Where Assessed-Eating: Wheelchair Grooming: Modified independent Where Assessed-Grooming: Wheelchair Upper Body Bathing: Supervision/safety Where Assessed-Upper Body Bathing: Shower Lower Body Bathing: Supervision/safety Where Assessed-Lower Body Bathing: Shower Upper Body Dressing: Supervision/safety Where Assessed-Upper Body Dressing: Other (Comment) Lower Body Dressing: Supervision/safety Where Assessed-Lower Body Dressing: Wheelchair Toileting: Supervision/safety Where Assessed-Toileting: Glass blower/designer: Close supervision Toilet Transfer Method: Counselling psychologist: Energy manager: Close supervison Clinical cytogeneticist Method: Optometrist: Radio broadcast assistant, Energy manager: Close supervision Social research officer, government Method: Heritage manager: Grab bars, Sports coach: Impaired (comment) Tracking/Visual Pursuits: Decreased smoothness of vertical tracking;Decreased smoothness of eye movement to LEFT superior field;Decreased smoothness of eye movement to LEFT inferior field;Decreased smoothness of eye movement to RIGHT inferior field;Requires cues, head turns, or add eye shifts to track;Decreased smoothness of eye movement to RIGHT superior field Saccades: Additional eye shifts occurred during testing;Additional head turns occurred during testing Convergence: Impaired (comment) Additional Comments: pt aware to let MD know of further visual issues Perception  Perception:  Within Functional Limits Praxis Praxis: Intact Cognition Overall Cognitive Status: Impaired/Different from baseline Arousal/Alertness: Awake/alert Orientation Level: Oriented X4 Year: 2022 Month: October Day of Week: Correct Attention: Divided Focused Attention: Appears intact Focused Attention Impairment: Verbal basic Alternating Attention: Appears intact Alternating Attention Impairment: Functional complex Divided Attention: Impaired Divided Attention Impairment: Functional complex Memory:  Impaired Memory Impairment: Decreased short term memory Decreased Short Term Memory: Functional complex;Verbal complex Immediate Memory Recall: Sock;Blue;Bed Memory Recall Sock: Without Cue Memory Recall Blue: Without Cue Memory Recall Bed: Without Cue Awareness: Impaired Awareness Impairment: Anticipatory impairment Problem Solving: Impaired Problem Solving Impairment: Functional complex Safety/Judgment: Impaired [employer] Scales of Cognitive Functioning: Purposeful/appropriate Sensation Sensation Light Touch: Impaired Detail Peripheral sensation comments: numbness, "coldness," and pain along ulnar distribution of R hand, reports this was mild PTA and has progressed during his stay, reports R hand tremor at baseline that has decreased since admission Light Touch Impaired Details: Impaired RUE Hot/Cold: Appears Intact Proprioception: Appears Intact Stereognosis: Not tested Coordination Gross Motor Movements are Fluid and Coordinated: No (L shoulder possible RTC involvement) Fine Motor Movements are Fluid and Coordinated: No Coordination and Movement Description: mild tremors B UE (decreased over stay at CIR), movements limited by body habitus Motor  Motor Motor: Abnormal postural alignment and control Motor - Discharge Observations: decreased strength in UE with LUE limited in ROM d/t shoulder Mobility  Bed Mobility Bed Mobility: Rolling Right;Rolling Left;Supine to Sit;Sit  to Supine Rolling Right: Independent Rolling Left: Independent Right Sidelying to Sit: Supervision/Verbal cueing Supine to Sit: Supervision/Verbal cueing Sit to Supine: Supervision/Verbal cueing Transfers Sit to Stand: Supervision/Verbal cueing Stand to Sit: Supervision/Verbal cueing  Trunk/Postural Assessment  Cervical Assessment Cervical Assessment: Exceptions to [facility] (forward head) Thoracic Assessment Thoracic Assessment: Exceptions to [facility] (rounded shoulders) Lumbar Assessment Lumbar Assessment: Exceptions to [facility] (posterior pelvic tilt) Postural Control Postural Control: Deficits on evaluation  Balance Balance Balance Assessed: Yes Static Standing Balance Static Standing - Balance Support: No upper extremity supported Static Standing - Level of Assistance: 5: Stand by assistance Static Standing - Comment/# of Minutes:  (posterior bias) Dynamic Standing Balance Dynamic Standing - Balance Support: No upper extremity supported Dynamic Standing - Level of Assistance: 5: Stand by assistance Dynamic Standing - Balance Activities: Reaching for objects;[place];Reaching across midline Dynamic Standing - Comments:  (posterior bias) Extremity/Trunk Assessment RUE Assessment RUE Assessment: Exceptions to [facility] Strength Comments: shoulder ROM 0-70 LUE Assessment LUE Assessment: Exceptions to [facility] Strength Comments: hand wrist & elbow WNL; shoulder gross range 0-50. positive for drop arm test   Jenyfer Trawick 03/14/2021, 5:02 PM

## 2021-03-14 NOTE — Progress Notes (Signed)
[place] PHYSICAL MEDICINE & REHABILITATION PROGRESS NOTE  Subjective/Complaints: Patient seen sitting up in his chair this morning working with therapies.  Patient had a hypoglycemic episode this morning.  Discussed with nursing, patient, and family.  Patient fell asleep and did not have his bedtime snack last night.  Reminded encourage patient regarding importance of bedtime snack.  He is looking forward to discharge tomorrow.  ROS: Denies CP, SOB, N/V/D  Objective: Vital Signs: Blood pressure 106/70, pulse (!) 59, temperature 98.7 F (37.1 C), temperature source Oral, resp. rate 18, height 5\' 8"  (1.727 m), weight 121.6 kg, SpO2 96 %. No results found. No results for input(s): WBC, HGB, HCT, PLT in the last 72 hours.   No results for input(s): NA, K, CL, CO2, GLUCOSE, BUN, CREATININE, CALCIUM in the last 72 hours.    Intake/Output Summary (Last 24 hours) at 03/14/2021 1057 Last data filed at 03/14/2021 0838 Gross per 24 hour  Intake 660 ml  Output 300 ml  Net 360 ml         Physical Exam: BP 106/70 (BP Location: Right Arm)   Pulse (!) 59   Temp 98.7 F (37.1 C) (Oral)   Resp 18   Ht 5\' 8"  (1.727 m)   Wt 121.6 kg   SpO2 96%   BMI 40.75 kg/m  Constitutional: No distress . Vital signs reviewed. HENT: Normocephalic.  Atraumatic. Eyes: EOMI. No discharge. Cardiovascular: No JVD.  RRR. Respiratory: Normal effort.  No stridor.  Bilateral clear to auscultation. GI: Non-distended.  BS +. Skin: Warm and dry.  Intact. Psych: Normal mood.  Normal behavior. Musc: Lower extremity edema.  No tenderness in extremities. Neuro: Alert Improved insight and awareness.   Follows all simple commands.   Motor: 5/5 in BUE , 4/5 in BLE prox to distal, stable  Assessment/Plan: 1. Functional deficits which require 3+ hours per day of interdisciplinary therapy in a comprehensive inpatient rehab setting. Physiatrist is providing close team supervision and 24 hour management of active  medical problems listed below. Physiatrist and rehab team continue to assess barriers to discharge/monitor patient progress toward functional and medical goals   Care Tool:  Bathing    Body parts bathed by patient: Right arm, Chest, Abdomen, Front perineal area, Right upper leg, Left upper leg, Face   Body parts bathed by helper: Left arm, Buttocks, Right lower leg, Left lower leg     Bathing assist Assist Level: Moderate Assistance - Patient 50 - 74%     Upper Body Dressing/Undressing Upper body dressing   What is the patient wearing?: Pull over shirt    Upper body assist Assist Level: Moderate Assistance - Patient 50 - 74%    Lower Body Dressing/Undressing Lower body dressing      What is the patient wearing?: Pants, Incontinence brief     Lower body assist Assist for lower body dressing: Total Assistance - Patient < 25%     Toileting Toileting    Toileting assist Assist for toileting: Total Assistance - Patient < 25%     Transfers Chair/bed transfer  Transfers assist     Chair/bed transfer assist level: Supervision/Verbal cueing Chair/bed transfer assistive device: Programmer, multimedia   Ambulation assist      Assist level: Supervision/Verbal cueing Assistive device: Walker-rolling Max distance: >200 ft   Walk 10 feet activity   Assist  Walk 10 feet activity did not occur: Safety/medical concerns  Assist level: Supervision/Verbal cueing Assistive device: Walker-rolling   Walk 50 feet  activity   Assist Walk 50 feet with 2 turns activity did not occur: Safety/medical concerns  Assist level: Supervision/Verbal cueing Assistive device: Walker-rolling    Walk 150 feet activity   Assist Walk 150 feet activity did not occur: Safety/medical concerns  Assist level: Supervision/Verbal cueing Assistive device: Walker-rolling    Walk 10 feet on uneven surface  activity   Assist Walk 10 feet on uneven surfaces activity did not  occur: Safety/medical concerns   Assist level: Contact Guard/Touching assist Assistive device: Walker-rolling   Wheelchair     Assist Is the patient using a wheelchair?: Yes Type of Wheelchair: Manual (w/c only used for transport with fatigue or increased distances, otherwise patient is ambulatory)    Wheelchair assist level: Dependent - Patient 0% Max wheelchair distance: >200 ft    Wheelchair 50 feet with 2 turns activity    Assist        Assist Level: Dependent - Patient 0%   Wheelchair 150 feet activity     Assist      Assist Level: Dependent - Patient 0%  BP 106/70 (BP Location: Right Arm)   Pulse (!) 59   Temp 98.7 F (37.1 C) (Oral)   Resp 18   Ht 5\' 8"  (1.727 m)   Wt 121.6 kg   SpO2 96%   BMI 40.75 kg/m   Medical Problem List and Plan: 1.  TBI/SAH/occipital and temporal bone fracture/right TM rupture secondary to fall 12/28/2020 down approximately 8 steps  Continue CIR, patient and family education, plan to discharge tomorrow 2.  Antithrombotics: -DVT/anticoagulation:  Pharmaceutical: Other (comment)/Eliquis for pulmonary emboli             -antiplatelet therapy: N/A 3. Pain: continue Neurontin 300 mg twice daily, Robaxin 1000 mg every 8 hours, oxycodone as needed             Monitor for headaches with increased exertion  Controlled with meds on 10/27 4. Anxiety: continue Xanax 0.25 mg twice daily as needed             -antipsychotic agents: Seroquel 25 mg nightly 5. Neuropsych: This patient is not capable of making decisions on his own behalf. 6. Skin/Wound Care: silver nitrate to trach stoma again 10/21 7. Fluids/Electrolytes/Nutrition: Routine in and outs 8.  VDRF.  Status post tracheostomy tube 01/14/2021.  Decannulated 02/26/2021  9.  Atrial fibrillation with RVR.  Follow-up cardiology services.  Continue Amiodarone 200 mg daily, Lopressor 12.5 mg twice daily.               Monitor with increased activity  Vitals:   03/13/21 1941  03/14/21 0516  BP: 128/69 106/70  Pulse: 66 (!) 59  Resp: 16 18  Temp: 99.3 F (37.4 C) 98.7 F (37.1 C)  SpO2: 99% 96%    10.  Diabetes mellitus with hyperglycemia.  Currently on Semglee 16 units twice daily.  Check blood sugars before meals and at bedtime.  Patient on Glucotrol 2.5 mg daily, Tradjenta 5 mg daily, Glucophage 1000 mg twice daily, Actos 45 mg daily prior to admission.  Resume oral agents as possible   CBG (last 3)  Recent Labs    03/13/21 2122 03/14/21 0642 03/14/21 0708  GLUCAP 158* 56* 73   Glucotrol resumed 10/19, DC'd on 10/28   Actos 15mg  daily resumed             Semglee decreased to 15 on 10/24, decreased to 10 on 10/26, decreased to 5 twice daily on 10/27  Metformin 500 daily started on 10/25  Needs bedtime snack  Remains labile on 10/27 Continue to monitor in outpatient setting with further adjustments as necessary 11.  Acute blood loss anemia.  Continue iron supplement.               Hemoglobin 8.3 on 10/24 -still awaiting stool sample for OB on 1/26 12.  Obesity.  BMI 32.74.  Dietary follow-up 13.  Slow transit constipation.  .  Bowel meds increased on 10/15, received sorbitol  Improved 14.?  OSA  Continue [place] nightly, continues to require on 10/25 15.  Hx of right middle ear hemorrhage, temporal and occipital fx-  -cipro ear gtts     LOS: 13 days A FACE TO FACE EVALUATION WAS PERFORMED  Angel [name] Angel [name] 03/14/2021, 10:57 AM

## 2021-03-15 LAB — GLUCOSE, CAPILLARY: Glucose-Capillary: 75 mg/dL (ref 70–99)

## 2021-03-15 NOTE — Progress Notes (Signed)
Patient ID: Angel [name], male   DOB: 1951/04/01, 70 y.o.   MRN: [id number]  SW received updates from CNA pt and pt wife were inquiring if home 02 was needed at night. SW informed not aware but will explore. Through review, there is a question about if pt has OSA. SW ordered overnight oximetry test with [employer] (ordered in parachute). SW spoke with [name] to inform on order entered and encourager her to follow-up if no f/u by Monday. SW also reported he would require sleep study to determine if there are any further issues.   Loralee Pacas, MSW, [place] Office: [phone] Cell: [phone] Fax: [phone]
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
## 2021-03-15 NOTE — Progress Notes (Incomplete)
INPATIENT REHABILITATION DISCHARGE NOTE   Discharge instructions by: Linna Hoff PA  Verbalized understanding: yes  Skin care/Wound care healing? Trach site healed no other wounds   Pain: no c/o pain   IV's: no IV's presnet on D/C   Tubes/Drains: none present   O2: room air   Safety instructions: given by PA   Patient belongings:sent home with family   Discharged [id number]   Discharged via: wheelchair   Notes:

## 2021-03-15 NOTE — Progress Notes (Signed)
Inpatient Rehabilitation Care Coordinator Discharge Note   Patient Details  Name: Angel [name] MRN: [id number] Date of Birth: 1950/12/15   Discharge location: D/c to home  Length of Stay: 13 days  Discharge activity level: Supervision-Min A  Home/community participation: Limited  Patient response [id number] Literacy - How often do you need to have someone help you when you read instructions, pamphlets, or other written material from your doctor or pharmacy?: Never  Patient response [id number] Isolation - How often do you feel lonely or isolated from those around you?: Never  Services provided included: MD, PT, RD, OT, SLP, CM, Pharmacy, Neuropsych, SW, TR, RN  Financial Services:  Charity fundraiser Utilized: [place] offered to/list presented to: Yes  Follow-up services arranged:  Outpatient, DME    Outpatient Servicies: [facility] for  outpatient PT/OT/SLP DME : [place] for overnight oximetry test    Patient response to transportation need: Is the patient able to respond to transportation needs?: Yes In the past 12 months, has lack of transportation kept you from medical appointments or from getting medications?: No In the past 12 months, has lack of transportation kept you from meetings, work, or from getting things needed for daily living?: No   Comments (or additional information):  Patient/Family verbalized understanding of follow-up arrangements:  Yes  Individual responsible for coordination of the follow-up plan: contact pt wife [name] [phone]  Confirmed correct DME delivered: Rana Snare 03/15/2021    Rana Snare

## 2021-03-18 ENCOUNTER — Telehealth: Payer: Self-pay | Admitting: *Deleted

## 2021-03-18 DIAGNOSIS — I10 Essential (primary) hypertension: Secondary | ICD-10-CM | POA: Diagnosis not present

## 2021-03-18 DIAGNOSIS — E785 Hyperlipidemia, unspecified: Secondary | ICD-10-CM | POA: Diagnosis not present

## 2021-03-18 NOTE — Telephone Encounter (Signed)
Angel [name] called because Angel [name] has been having nausea onset during the night and has vomited a small amount. Mostly nausea. He has been able to keep down some food. She denies any change in mental status and his bp is running 133/72 and his blood sugar was 111. She has reached out to the PCP and they will write for him some zofran if needed. She has emetrol at home. I advised if he feels worse of becomes unable to keep down any fluids or food, she should reach back out to the PCP and give [place] a call if more questions. He will keep the appt with Dr Naaman Plummer on Wednesday unless he does not improve.

## 2021-03-19 DIAGNOSIS — G4730 Sleep apnea, unspecified: Secondary | ICD-10-CM | POA: Diagnosis not present

## 2021-03-19 DIAGNOSIS — R0683 Snoring: Secondary | ICD-10-CM | POA: Diagnosis not present

## 2021-03-20 ENCOUNTER — Encounter: Payer: PPO | Attending: Physical Medicine & Rehabilitation | Admitting: Physical Medicine & Rehabilitation

## 2021-03-20 ENCOUNTER — Other Ambulatory Visit: Payer: Self-pay

## 2021-03-20 ENCOUNTER — Encounter: Payer: Self-pay | Admitting: Physical Medicine & Rehabilitation

## 2021-03-20 DIAGNOSIS — M7989 Other specified soft tissue disorders: Secondary | ICD-10-CM | POA: Diagnosis not present

## 2021-03-20 MED ORDER — FUROSEMIDE 20 MG PO TABS: 20.0000 mg | ORAL_TABLET | Freq: Every day | ORAL | 0 refills | Status: AC
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
## 2021-03-20 NOTE — Patient Instructions (Addendum)
STOP ACTOS  LASIX 20MG  DAILY FOR 10 DAYS  ELEVATE YOUR LEGS WHEN YOUR  SITTING  4" ACE WRAP FROM FEET TO KNEE  MAKE SURE YOU HAVE POTASSIUM IN YOUR DAILY DIET

## 2021-03-21 DIAGNOSIS — S0219XD Other fracture of base of skull, subsequent encounter for fracture with routine healing: Secondary | ICD-10-CM | POA: Diagnosis not present

## 2021-03-21 DIAGNOSIS — H9193 Unspecified hearing loss, bilateral: Secondary | ICD-10-CM | POA: Diagnosis not present

## 2021-03-25 ENCOUNTER — Telehealth: Payer: Self-pay

## 2021-03-25 DIAGNOSIS — R5381 Other malaise: Secondary | ICD-10-CM | POA: Diagnosis not present

## 2021-03-25 DIAGNOSIS — Z736 Limitation of activities due to disability: Secondary | ICD-10-CM | POA: Diagnosis not present

## 2021-03-25 DIAGNOSIS — R2681 Unsteadiness on feet: Secondary | ICD-10-CM | POA: Diagnosis not present

## 2021-03-25 DIAGNOSIS — M6281 Muscle weakness (generalized): Secondary | ICD-10-CM | POA: Diagnosis not present

## 2021-03-25 DIAGNOSIS — S069XAA Unspecified intracranial injury with loss of consciousness status unknown, initial encounter: Secondary | ICD-10-CM | POA: Diagnosis not present

## 2021-03-25 DIAGNOSIS — G5621 Lesion of ulnar nerve, right upper limb: Secondary | ICD-10-CM | POA: Diagnosis not present

## 2021-03-25 NOTE — Telephone Encounter (Signed)
Patient's wife called because she needed clarification on medications the patient is supposed to be taking. Left voicemail to return call

## 2021-03-26 NOTE — Telephone Encounter (Signed)
I spoke with Mrs Ottaway and she was asking about the allopurinol and quinapril that is still on his med list.  His BP is being managed by lopressor right now and he has only had one gout flair years ago and has just been kept on the medication.  He has an appt with his PCP on Friday and I have advised her to review this with PCP at that time.

## 2021-03-29 DIAGNOSIS — Z23 Encounter for immunization: Secondary | ICD-10-CM | POA: Diagnosis not present

## 2021-03-29 DIAGNOSIS — I480 Paroxysmal atrial fibrillation: Secondary | ICD-10-CM | POA: Diagnosis not present

## 2021-03-29 DIAGNOSIS — E1142 Type 2 diabetes mellitus with diabetic polyneuropathy: Secondary | ICD-10-CM | POA: Diagnosis not present

## 2021-03-29 DIAGNOSIS — G5621 Lesion of ulnar nerve, right upper limb: Secondary | ICD-10-CM | POA: Diagnosis not present

## 2021-03-29 DIAGNOSIS — E1169 Type 2 diabetes mellitus with other specified complication: Secondary | ICD-10-CM | POA: Diagnosis not present

## 2021-03-29 DIAGNOSIS — D6869 Other thrombophilia: Secondary | ICD-10-CM | POA: Diagnosis not present

## 2021-03-29 DIAGNOSIS — I10 Essential (primary) hypertension: Secondary | ICD-10-CM | POA: Diagnosis not present

## 2021-03-29 DIAGNOSIS — E785 Hyperlipidemia, unspecified: Secondary | ICD-10-CM | POA: Diagnosis not present

## 2021-03-29 DIAGNOSIS — S069X9S Unspecified intracranial injury with loss of consciousness of unspecified duration, sequela: Secondary | ICD-10-CM | POA: Diagnosis not present

## 2021-03-29 DIAGNOSIS — S066XAA Traumatic subarachnoid hemorrhage with loss of consciousness status unknown, initial encounter: Secondary | ICD-10-CM | POA: Diagnosis not present

## 2021-03-29 DIAGNOSIS — S065XAA Traumatic subdural hemorrhage with loss of consciousness status unknown, initial encounter: Secondary | ICD-10-CM | POA: Diagnosis not present

## 2021-04-04 DIAGNOSIS — G5621 Lesion of ulnar nerve, right upper limb: Secondary | ICD-10-CM | POA: Diagnosis not present

## 2021-04-04 DIAGNOSIS — S06309A Unspecified focal traumatic brain injury with loss of consciousness of unspecified duration, initial encounter: Secondary | ICD-10-CM | POA: Diagnosis not present

## 2021-04-10 ENCOUNTER — Ambulatory Visit (INDEPENDENT_AMBULATORY_CARE_PROVIDER_SITE_OTHER): Payer: PPO

## 2021-04-10 ENCOUNTER — Other Ambulatory Visit: Payer: Self-pay

## 2021-04-10 DIAGNOSIS — G5621 Lesion of ulnar nerve, right upper limb: Secondary | ICD-10-CM | POA: Diagnosis not present

## 2021-04-10 DIAGNOSIS — G5601 Carpal tunnel syndrome, right upper limb: Secondary | ICD-10-CM | POA: Diagnosis not present

## 2021-04-10 DIAGNOSIS — M7989 Other specified soft tissue disorders: Secondary | ICD-10-CM | POA: Diagnosis not present

## 2021-04-17 DIAGNOSIS — E785 Hyperlipidemia, unspecified: Secondary | ICD-10-CM | POA: Diagnosis not present

## 2021-04-17 DIAGNOSIS — I10 Essential (primary) hypertension: Secondary | ICD-10-CM | POA: Diagnosis not present

## 2021-04-22 DIAGNOSIS — M6281 Muscle weakness (generalized): Secondary | ICD-10-CM | POA: Diagnosis not present

## 2021-04-22 DIAGNOSIS — S069XAA Unspecified intracranial injury with loss of consciousness status unknown, initial encounter: Secondary | ICD-10-CM | POA: Diagnosis not present

## 2021-04-22 DIAGNOSIS — Z736 Limitation of activities due to disability: Secondary | ICD-10-CM | POA: Diagnosis not present

## 2021-04-22 DIAGNOSIS — G5621 Lesion of ulnar nerve, right upper limb: Secondary | ICD-10-CM | POA: Diagnosis not present

## 2021-04-22 DIAGNOSIS — R5381 Other malaise: Secondary | ICD-10-CM | POA: Diagnosis not present
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
## 2021-04-24 ENCOUNTER — Encounter (HOSPITAL_COMMUNITY): Payer: Self-pay | Admitting: Radiology

## 2021-04-24 ENCOUNTER — Ambulatory Visit: Payer: PPO | Admitting: Internal Medicine

## 2021-05-01 DIAGNOSIS — H832X1 Labyrinthine dysfunction, right ear: Secondary | ICD-10-CM | POA: Diagnosis not present

## 2021-05-01 DIAGNOSIS — H6121 Impacted cerumen, right ear: Secondary | ICD-10-CM | POA: Diagnosis not present

## 2021-05-01 DIAGNOSIS — G5621 Lesion of ulnar nerve, right upper limb: Secondary | ICD-10-CM | POA: Diagnosis not present

## 2021-05-01 DIAGNOSIS — I480 Paroxysmal atrial fibrillation: Secondary | ICD-10-CM | POA: Diagnosis not present

## 2021-05-01 DIAGNOSIS — E1169 Type 2 diabetes mellitus with other specified complication: Secondary | ICD-10-CM | POA: Diagnosis not present

## 2021-05-01 DIAGNOSIS — I82541 Chronic embolism and thrombosis of right tibial vein: Secondary | ICD-10-CM | POA: Diagnosis not present

## 2021-05-01 DIAGNOSIS — E1142 Type 2 diabetes mellitus with diabetic polyneuropathy: Secondary | ICD-10-CM | POA: Diagnosis not present

## 2021-05-01 DIAGNOSIS — D500 Iron deficiency anemia secondary to blood loss (chronic): Secondary | ICD-10-CM | POA: Diagnosis not present

## 2021-05-01 DIAGNOSIS — D6869 Other thrombophilia: Secondary | ICD-10-CM | POA: Diagnosis not present

## 2021-05-01 DIAGNOSIS — E785 Hyperlipidemia, unspecified: Secondary | ICD-10-CM | POA: Diagnosis not present

## 2021-05-01 DIAGNOSIS — S069X9A Unspecified intracranial injury with loss of consciousness of unspecified duration, initial encounter: Secondary | ICD-10-CM | POA: Diagnosis not present

## 2021-05-03 ENCOUNTER — Encounter: Payer: Self-pay | Admitting: Internal Medicine

## 2021-05-03 ENCOUNTER — Ambulatory Visit: Payer: PPO | Admitting: Internal Medicine

## 2021-05-03 ENCOUNTER — Other Ambulatory Visit: Payer: Self-pay

## 2021-05-03 ENCOUNTER — Ambulatory Visit (INDEPENDENT_AMBULATORY_CARE_PROVIDER_SITE_OTHER): Payer: PPO

## 2021-05-03 VITALS — BP 138/70 | HR 68 | Ht 68.0 in | Wt 275.0 lb

## 2021-05-03 DIAGNOSIS — I700 Atherosclerosis of aorta: Secondary | ICD-10-CM | POA: Diagnosis not present

## 2021-05-03 DIAGNOSIS — I4891 Unspecified atrial fibrillation: Secondary | ICD-10-CM

## 2021-05-03 DIAGNOSIS — Z5181 Encounter for therapeutic drug level monitoring: Secondary | ICD-10-CM | POA: Diagnosis not present

## 2021-05-03 DIAGNOSIS — Z79899 Other long term (current) drug therapy: Secondary | ICD-10-CM

## 2021-05-03 DIAGNOSIS — R0609 Other forms of dyspnea: Secondary | ICD-10-CM | POA: Insufficient documentation
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
## 2021-05-03 NOTE — Patient Instructions (Addendum)
Medication Instructions:  Your physician recommends that you continue on your current medications as directed. Please refer to the Current Medication list given to you today.  *If you need a refill on your cardiac medications before your next appointment, please call your pharmacy*   Lab Work: TODAY: TSH, BNP, FLP, CMP If you have labs (blood work) drawn today and your tests are completely normal, you will receive your results only by: [place] (if you have MyChart) OR A paper copy in the mail If you have any lab test that is abnormal or we need to change your treatment, we will call you to review the results.   Testing/Procedures: Your physician has requested that you wear a 14 day heart monitor.  Your physician has recommended that you have a pulmonary function test. Pulmonary Function Tests are a group of tests that measure how well air moves in and out of your lungs.    Follow-Up: At [facility], you and your health needs are our priority.  As part of our continuing mission to provide you with exceptional heart care, we have created designated Provider Care Teams.  These Care Teams include your primary Cardiologist (physician) and Advanced Practice Providers (APPs -  Physician Assistants and Nurse Practitioners) who all work together to provide you with the care you need, when you need it.   Your next appointment:   6 -8 week(s)  The format for your next appointment:   In Person  Provider:   Werner Lean, MD    OTHER INSTRUCTIONS  [place] Monitor Instructions  Your physician has requested you wear a ZIO patch monitor for 14 days.  This is a single patch monitor. [employer] supplies one patch monitor per enrollment. Additional stickers are not available. Please do not apply patch if you will be having a Nuclear Stress Test,  Echocardiogram, Cardiac CT, MRI, or Chest Xray during the period you would be wearing the  monitor. The patch cannot be  worn during these tests. You cannot remove and re-apply the  ZIO XT patch monitor.  Your ZIO patch monitor will be mailed 3 day USPS to your address on file. It may take 3-5 days  to receive your monitor after you have been enrolled.  Once you have received your monitor, please review the enclosed instructions. Your monitor  has already been registered assigning a specific monitor serial # to you.  Billing and Patient Assistance Program Information  We have supplied [employer] with any of your insurance information on file for billing purposes. [employer] offers a sliding scale Patient Assistance Program for patients that do not have  insurance, or whose insurance does not completely cover the cost of the ZIO monitor.  You must apply for the Patient Assistance Program to qualify for this discounted rate.  To apply, please call [employer] at [phone], select option 4, select option 2, ask to apply for  Patient Assistance Program. Theodore Demark will ask your household income, and how many people  are in your household. They will quote your out-of-pocket cost based on that information.  [employer] will also be able to set up a 51-month, interest-free payment plan if needed.  Applying the monitor   Shave hair from upper left chest.  Hold abrader disc by orange tab. Rub abrader in 40 strokes over the upper left chest as  indicated in your monitor instructions.  Clean area with 4 enclosed alcohol pads. Let dry.  Apply patch as indicated in monitor instructions. Patch  will be placed under collarbone on left  side of chest with arrow pointing upward.  Rub patch adhesive wings for 2 minutes. Remove white label marked "1". Remove the white  label marked "2". Rub patch adhesive wings for 2 additional minutes.  While looking in a mirror, press and release button in center of patch. A small green light will  flash 3-4 times. This will be your only indicator that the monitor has been turned on.  Do not shower for  the first 24 hours. You may shower after the first 24 hours.  Press the button if you feel a symptom. You will hear a small click. Record Date, Time and  Symptom in the Patient Logbook.  When you are ready to remove the patch, follow instructions on the last 2 pages of Patient  Logbook. Stick patch monitor onto the last page of Patient Logbook.  Place Patient Logbook in the blue and white box. Use locking tab on box and tape box closed  securely. The blue and white box has prepaid postage on it. Please place it in the mailbox as  soon as possible. Your physician should have your test results approximately 7 days after the  monitor has been mailed back to [facility].  Call [place] at [phone] if you have questions regarding  your ZIO XT patch monitor. Call them immediately if you see an orange light blinking on your  monitor.  If your monitor falls off in less than 4 days, contact our Monitor department at [phone].  If your monitor becomes loose or falls off after 4 days call [employer] at [phone] for  suggestions on securing your monitor

## 2021-05-03 NOTE — Progress Notes (Unsigned)
Enrolled patient for a 14 day Zio XT  monitor to be mailed to patients home  °

## 2021-05-04 LAB — LIPID PANEL
Chol/HDL Ratio: 2.6 ratio (ref 0.0–5.0)
Cholesterol, Total: 75 mg/dL — ABNORMAL LOW (ref 100–199)
HDL: 29 mg/dL — ABNORMAL LOW (ref >39)
LDL Chol Calc (NIH): 23 mg/dL (ref 0–99)
Triglycerides: 133 mg/dL (ref 0–149)
VLDL Cholesterol Cal: 23 mg/dL (ref 5–40)

## 2021-05-04 LAB — COMPREHENSIVE METABOLIC PANEL
ALT: 10 IU/L (ref 0–44)
AST: 12 IU/L (ref 0–40)
Albumin/Globulin Ratio: 2 (ref 1.2–2.2)
Albumin: 4.2 g/dL (ref 3.8–4.8)
Alkaline Phosphatase: 84 IU/L (ref 44–121)
BUN/Creatinine Ratio: 17 (ref 10–24)
BUN: 18 mg/dL (ref 8–27)
Bilirubin Total: <0.2 mg/dL (ref 0.0–1.2)
CO2: 22 mmol/L (ref 20–29)
Calcium: 9.2 mg/dL (ref 8.6–10.2)
Chloride: 104 mmol/L (ref 96–106)
Creatinine, Ser: 1.06 mg/dL (ref 0.76–1.27)
Globulin, Total: 2.1 g/dL (ref 1.5–4.5)
Glucose: 175 mg/dL — ABNORMAL HIGH (ref 70–99)
Potassium: 4.2 mmol/L (ref 3.5–5.2)
Sodium: 142 mmol/L (ref 134–144)
Total Protein: 6.3 g/dL (ref 6.0–8.5)
eGFR: 75 mL/min/{1.73_m2} (ref >59)

## 2021-05-04 LAB — PRO B NATRIURETIC PEPTIDE: NT-Pro BNP: 248 pg/mL (ref 0–376)

## 2021-05-04 LAB — TSH: TSH: 1.67 u[IU]/mL (ref 0.450–4.500)

## 2021-05-06 DIAGNOSIS — I4891 Unspecified atrial fibrillation: Secondary | ICD-10-CM | POA: Diagnosis not present

## 2021-05-06 DIAGNOSIS — R0609 Other forms of dyspnea: Secondary | ICD-10-CM

## 2021-05-09 ENCOUNTER — Ambulatory Visit: Payer: PPO | Admitting: Internal Medicine

## 2021-05-09 DIAGNOSIS — I4891 Unspecified atrial fibrillation: Secondary | ICD-10-CM | POA: Diagnosis not present

## 2021-05-09 DIAGNOSIS — R0609 Other forms of dyspnea: Secondary | ICD-10-CM | POA: Diagnosis not present

## 2021-05-09 DIAGNOSIS — Z5181 Encounter for therapeutic drug level monitoring: Secondary | ICD-10-CM | POA: Diagnosis not present

## 2021-05-09 DIAGNOSIS — Z79899 Other long term (current) drug therapy: Secondary | ICD-10-CM | POA: Diagnosis not present

## 2021-05-17 DIAGNOSIS — I10 Essential (primary) hypertension: Secondary | ICD-10-CM | POA: Diagnosis not present

## 2021-05-17 DIAGNOSIS — E785 Hyperlipidemia, unspecified: Secondary | ICD-10-CM | POA: Diagnosis not present

## 2021-05-22 DIAGNOSIS — R2681 Unsteadiness on feet: Secondary | ICD-10-CM | POA: Diagnosis not present

## 2021-05-22 DIAGNOSIS — S069XAA Unspecified intracranial injury with loss of consciousness status unknown, initial encounter: Secondary | ICD-10-CM | POA: Diagnosis not present

## 2021-05-22 DIAGNOSIS — M6281 Muscle weakness (generalized): Secondary | ICD-10-CM | POA: Diagnosis not present

## 2021-05-24 DIAGNOSIS — R0609 Other forms of dyspnea: Secondary | ICD-10-CM | POA: Diagnosis not present

## 2021-05-24 DIAGNOSIS — I4891 Unspecified atrial fibrillation: Secondary | ICD-10-CM | POA: Diagnosis not present

## 2021-05-30 ENCOUNTER — Telehealth: Payer: Self-pay

## 2021-05-30 NOTE — Telephone Encounter (Signed)
The patient has been notified of the result and verbalized understanding.  All questions (if any) were answered. Antonieta Iba, RN 05/30/2021 3:42 PM  Patient will stop amiodarone.

## 2021-05-30 NOTE — Telephone Encounter (Signed)
-----   Message from Werner Lean, MD sent at 05/30/2021  2:54 PM EST ----- No further Afib.  Can stop amiodarone. ----- Message ----- From: Werner Lean, MD Sent: 05/30/2021  11:09 AM EST To: Werner Lean, MD

## 2021-06-04 DIAGNOSIS — J020 Streptococcal pharyngitis: Secondary | ICD-10-CM | POA: Diagnosis not present

## 2021-06-05 ENCOUNTER — Encounter: Payer: PPO | Admitting: Physical Medicine & Rehabilitation

## 2021-06-07 DIAGNOSIS — G63 Polyneuropathy in diseases classified elsewhere: Secondary | ICD-10-CM | POA: Diagnosis not present

## 2021-06-07 DIAGNOSIS — E785 Hyperlipidemia, unspecified: Secondary | ICD-10-CM | POA: Diagnosis not present

## 2021-06-07 DIAGNOSIS — G5621 Lesion of ulnar nerve, right upper limb: Secondary | ICD-10-CM | POA: Diagnosis not present

## 2021-06-07 DIAGNOSIS — S069X9S Unspecified intracranial injury with loss of consciousness of unspecified duration, sequela: Secondary | ICD-10-CM | POA: Diagnosis not present

## 2021-06-07 DIAGNOSIS — D6869 Other thrombophilia: Secondary | ICD-10-CM | POA: Diagnosis not present

## 2021-06-07 DIAGNOSIS — E1142 Type 2 diabetes mellitus with diabetic polyneuropathy: Secondary | ICD-10-CM | POA: Diagnosis not present

## 2021-06-07 DIAGNOSIS — Z0000 Encounter for general adult medical examination without abnormal findings: Secondary | ICD-10-CM | POA: Diagnosis not present

## 2021-06-07 DIAGNOSIS — I480 Paroxysmal atrial fibrillation: Secondary | ICD-10-CM | POA: Diagnosis not present

## 2021-06-07 DIAGNOSIS — E1169 Type 2 diabetes mellitus with other specified complication: Secondary | ICD-10-CM | POA: Diagnosis not present

## 2021-06-07 DIAGNOSIS — I82541 Chronic embolism and thrombosis of right tibial vein: Secondary | ICD-10-CM | POA: Diagnosis not present

## 2021-06-07 DIAGNOSIS — Z6839 Body mass index (BMI) 39.0-39.9, adult: Secondary | ICD-10-CM | POA: Diagnosis not present
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
## 2021-06-18 ENCOUNTER — Ambulatory Visit: Payer: PPO | Admitting: Internal Medicine

## 2021-06-18 ENCOUNTER — Encounter: Payer: Self-pay | Admitting: Internal Medicine

## 2021-06-18 ENCOUNTER — Other Ambulatory Visit: Payer: Self-pay

## 2021-06-18 VITALS — BP 122/64 | HR 56 | Ht 69.0 in | Wt 270.0 lb

## 2021-06-18 DIAGNOSIS — I10 Essential (primary) hypertension: Secondary | ICD-10-CM | POA: Diagnosis not present

## 2021-06-18 DIAGNOSIS — I480 Paroxysmal atrial fibrillation: Secondary | ICD-10-CM | POA: Diagnosis not present

## 2021-06-18 DIAGNOSIS — I700 Atherosclerosis of aorta: Secondary | ICD-10-CM

## 2021-06-18 NOTE — Patient Instructions (Signed)
Medication Instructions:  Your physician recommends that you continue on your current medications as directed. Please refer to the Current Medication list given to you today.  *If you need a refill on your cardiac medications before your next appointment, please call your pharmacy*   Lab Work: NONE If you have labs (blood work) drawn today and your tests are completely normal, you will receive your results only by: [place] (if you have MyChart) OR A paper copy in the mail If you have any lab test that is abnormal or we need to change your treatment, we will call you to review the results.   Testing/Procedures: NONE   Follow-Up: At [facility], you and your health needs are our priority.  As part of our continuing mission to provide you with exceptional heart care, we have created designated Provider Care Teams.  These Care Teams include your primary Cardiologist (physician) and Advanced Practice Providers (APPs -  Physician Assistants and Nurse Practitioners) who all work together to provide you with the care you need, when you need it.   Your next appointment:   6 month(s)  The format for your next appointment:   In Person  Provider:   Werner Lean, MD

## 2021-06-19 ENCOUNTER — Ambulatory Visit: Payer: PPO | Admitting: Internal Medicine

## 2021-06-19 DIAGNOSIS — R262 Difficulty in walking, not elsewhere classified: Secondary | ICD-10-CM | POA: Diagnosis not present

## 2021-06-19 DIAGNOSIS — M6281 Muscle weakness (generalized): Secondary | ICD-10-CM | POA: Diagnosis not present

## 2021-06-19 DIAGNOSIS — S069XAA Unspecified intracranial injury with loss of consciousness status unknown, initial encounter: Secondary | ICD-10-CM | POA: Diagnosis not present

## 2021-06-21 DIAGNOSIS — D225 Melanocytic nevi of trunk: Secondary | ICD-10-CM | POA: Diagnosis not present

## 2021-06-21 DIAGNOSIS — D485 Neoplasm of uncertain behavior of skin: Secondary | ICD-10-CM | POA: Diagnosis not present

## 2021-07-16 DIAGNOSIS — E1169 Type 2 diabetes mellitus with other specified complication: Secondary | ICD-10-CM | POA: Diagnosis not present

## 2021-07-16 DIAGNOSIS — G63 Polyneuropathy in diseases classified elsewhere: Secondary | ICD-10-CM | POA: Diagnosis not present

## 2021-07-16 DIAGNOSIS — E785 Hyperlipidemia, unspecified: Secondary | ICD-10-CM | POA: Diagnosis not present

## 2021-07-17 DIAGNOSIS — S069XAA Unspecified intracranial injury with loss of consciousness status unknown, initial encounter: Secondary | ICD-10-CM | POA: Diagnosis not present

## 2021-07-17 DIAGNOSIS — R262 Difficulty in walking, not elsewhere classified: Secondary | ICD-10-CM | POA: Diagnosis not present

## 2021-07-17 DIAGNOSIS — M6281 Muscle weakness (generalized): Secondary | ICD-10-CM | POA: Diagnosis not present

## 2021-07-22 DIAGNOSIS — M792 Neuralgia and neuritis, unspecified: Secondary | ICD-10-CM | POA: Diagnosis not present

## 2021-07-29 DIAGNOSIS — B0229 Other postherpetic nervous system involvement: Secondary | ICD-10-CM | POA: Diagnosis not present

## 2021-07-29 DIAGNOSIS — Z6839 Body mass index (BMI) 39.0-39.9, adult: Secondary | ICD-10-CM | POA: Diagnosis not present

## 2021-07-29 DIAGNOSIS — S39012A Strain of muscle, fascia and tendon of lower back, initial encounter: Secondary | ICD-10-CM | POA: Diagnosis not present

## 2021-08-14 DIAGNOSIS — K219 Gastro-esophageal reflux disease without esophagitis: Secondary | ICD-10-CM | POA: Diagnosis not present

## 2021-08-14 DIAGNOSIS — E785 Hyperlipidemia, unspecified: Secondary | ICD-10-CM | POA: Diagnosis not present

## 2021-08-14 DIAGNOSIS — Z7901 Long term (current) use of anticoagulants: Secondary | ICD-10-CM | POA: Diagnosis not present

## 2021-08-14 DIAGNOSIS — Z7984 Long term (current) use of oral hypoglycemic drugs: Secondary | ICD-10-CM | POA: Diagnosis not present

## 2021-08-14 DIAGNOSIS — Z6841 Body Mass Index (BMI) 40.0 and over, adult: Secondary | ICD-10-CM | POA: Diagnosis not present

## 2021-08-14 DIAGNOSIS — I2699 Other pulmonary embolism without acute cor pulmonale: Secondary | ICD-10-CM | POA: Diagnosis not present

## 2021-08-14 DIAGNOSIS — E1142 Type 2 diabetes mellitus with diabetic polyneuropathy: Secondary | ICD-10-CM | POA: Diagnosis not present

## 2021-08-14 DIAGNOSIS — I10 Essential (primary) hypertension: Secondary | ICD-10-CM | POA: Diagnosis not present

## 2021-08-14 DIAGNOSIS — Z87891 Personal history of nicotine dependence: Secondary | ICD-10-CM | POA: Diagnosis not present

## 2021-08-15 DIAGNOSIS — G63 Polyneuropathy in diseases classified elsewhere: Secondary | ICD-10-CM | POA: Diagnosis not present

## 2021-08-15 DIAGNOSIS — Z6839 Body mass index (BMI) 39.0-39.9, adult: Secondary | ICD-10-CM | POA: Diagnosis not present

## 2021-08-15 DIAGNOSIS — B0229 Other postherpetic nervous system involvement: Secondary | ICD-10-CM | POA: Diagnosis not present

## 2021-08-15 DIAGNOSIS — E1142 Type 2 diabetes mellitus with diabetic polyneuropathy: Secondary | ICD-10-CM | POA: Diagnosis not present

## 2021-08-15 DIAGNOSIS — S069X9S Unspecified intracranial injury with loss of consciousness of unspecified duration, sequela: Secondary | ICD-10-CM | POA: Diagnosis not present

## 2021-08-15 DIAGNOSIS — R195 Other fecal abnormalities: Secondary | ICD-10-CM | POA: Diagnosis not present

## 2021-08-15 DIAGNOSIS — Z8781 Personal history of (healed) traumatic fracture: Secondary | ICD-10-CM | POA: Diagnosis not present

## 2021-08-16 DIAGNOSIS — E785 Hyperlipidemia, unspecified: Secondary | ICD-10-CM | POA: Diagnosis not present

## 2021-08-16 DIAGNOSIS — I10 Essential (primary) hypertension: Secondary | ICD-10-CM | POA: Diagnosis not present

## 2021-08-21 DIAGNOSIS — S069XAA Unspecified intracranial injury with loss of consciousness status unknown, initial encounter: Secondary | ICD-10-CM | POA: Diagnosis not present

## 2021-08-21 DIAGNOSIS — M6281 Muscle weakness (generalized): Secondary | ICD-10-CM | POA: Diagnosis not present

## 2021-08-21 DIAGNOSIS — R2681 Unsteadiness on feet: Secondary | ICD-10-CM | POA: Diagnosis not present

## 2021-08-28 ENCOUNTER — Ambulatory Visit: Payer: PPO | Admitting: Physical Medicine & Rehabilitation

## 2021-09-11 ENCOUNTER — Encounter: Payer: PPO | Admitting: Physical Medicine & Rehabilitation

## 2021-09-13 DIAGNOSIS — J301 Allergic rhinitis due to pollen: Secondary | ICD-10-CM | POA: Diagnosis not present

## 2021-09-13 DIAGNOSIS — L03031 Cellulitis of right toe: Secondary | ICD-10-CM | POA: Diagnosis not present

## 2021-09-13 DIAGNOSIS — Z6841 Body Mass Index (BMI) 40.0 and over, adult: Secondary | ICD-10-CM | POA: Diagnosis not present

## 2021-09-15 DIAGNOSIS — E1169 Type 2 diabetes mellitus with other specified complication: Secondary | ICD-10-CM | POA: Diagnosis not present

## 2021-09-15 DIAGNOSIS — E785 Hyperlipidemia, unspecified: Secondary | ICD-10-CM | POA: Diagnosis not present

## 2021-09-15 DIAGNOSIS — I10 Essential (primary) hypertension: Secondary | ICD-10-CM | POA: Diagnosis not present

## 2021-09-23 DIAGNOSIS — E785 Hyperlipidemia, unspecified: Secondary | ICD-10-CM | POA: Diagnosis not present

## 2021-09-23 DIAGNOSIS — Z6839 Body mass index (BMI) 39.0-39.9, adult: Secondary | ICD-10-CM | POA: Diagnosis not present

## 2021-09-23 DIAGNOSIS — E1169 Type 2 diabetes mellitus with other specified complication: Secondary | ICD-10-CM | POA: Diagnosis not present

## 2021-09-23 DIAGNOSIS — L600 Ingrowing nail: Secondary | ICD-10-CM | POA: Diagnosis not present

## 2021-09-27 DIAGNOSIS — E1142 Type 2 diabetes mellitus with diabetic polyneuropathy: Secondary | ICD-10-CM | POA: Diagnosis not present

## 2021-09-27 DIAGNOSIS — L03031 Cellulitis of right toe: Secondary | ICD-10-CM | POA: Diagnosis not present

## 2021-09-27 DIAGNOSIS — Z7984 Long term (current) use of oral hypoglycemic drugs: Secondary | ICD-10-CM | POA: Diagnosis not present

## 2021-09-27 DIAGNOSIS — I10 Essential (primary) hypertension: Secondary | ICD-10-CM | POA: Diagnosis not present

## 2021-10-07 DIAGNOSIS — H1840 Unspecified corneal degeneration: Secondary | ICD-10-CM | POA: Diagnosis not present

## 2021-10-07 DIAGNOSIS — E119 Type 2 diabetes mellitus without complications: Secondary | ICD-10-CM | POA: Diagnosis not present

## 2021-10-07 DIAGNOSIS — H5203 Hypermetropia, bilateral: Secondary | ICD-10-CM | POA: Diagnosis not present

## 2021-10-07 DIAGNOSIS — Z7984 Long term (current) use of oral hypoglycemic drugs: Secondary | ICD-10-CM | POA: Diagnosis not present

## 2021-10-07 DIAGNOSIS — H524 Presbyopia: Secondary | ICD-10-CM | POA: Diagnosis not present

## 2021-10-07 DIAGNOSIS — H52223 Regular astigmatism, bilateral: Secondary | ICD-10-CM | POA: Diagnosis not present

## 2021-10-07 DIAGNOSIS — H35373 Puckering of macula, bilateral: Secondary | ICD-10-CM | POA: Diagnosis not present

## 2021-10-07 DIAGNOSIS — I10 Essential (primary) hypertension: Secondary | ICD-10-CM | POA: Diagnosis not present

## 2021-10-07 DIAGNOSIS — H18451 Nodular corneal degeneration, right eye: Secondary | ICD-10-CM | POA: Diagnosis not present

## 2021-10-07 DIAGNOSIS — H25813 Combined forms of age-related cataract, bilateral: Secondary | ICD-10-CM | POA: Diagnosis not present

## 2021-10-07 DIAGNOSIS — H18501 Unspecified hereditary corneal dystrophies, right eye: Secondary | ICD-10-CM | POA: Diagnosis not present

## 2021-10-16 DIAGNOSIS — E1142 Type 2 diabetes mellitus with diabetic polyneuropathy: Secondary | ICD-10-CM | POA: Diagnosis not present

## 2021-10-16 DIAGNOSIS — E1169 Type 2 diabetes mellitus with other specified complication: Secondary | ICD-10-CM | POA: Diagnosis not present

## 2021-10-17 ENCOUNTER — Encounter: Payer: Self-pay | Admitting: Gastroenterology

## 2021-10-17 DIAGNOSIS — E1142 Type 2 diabetes mellitus with diabetic polyneuropathy: Secondary | ICD-10-CM | POA: Diagnosis not present

## 2021-10-17 DIAGNOSIS — L600 Ingrowing nail: Secondary | ICD-10-CM | POA: Diagnosis not present

## 2021-11-01 DIAGNOSIS — L578 Other skin changes due to chronic exposure to nonionizing radiation: Secondary | ICD-10-CM | POA: Diagnosis not present

## 2021-11-01 DIAGNOSIS — R233 Spontaneous ecchymoses: Secondary | ICD-10-CM | POA: Diagnosis not present

## 2021-11-01 DIAGNOSIS — L821 Other seborrheic keratosis: Secondary | ICD-10-CM | POA: Diagnosis not present

## 2021-11-01 DIAGNOSIS — D225 Melanocytic nevi of trunk: Secondary | ICD-10-CM | POA: Diagnosis not present

## 2021-11-04 DIAGNOSIS — S0219XA Other fracture of base of skull, initial encounter for closed fracture: Secondary | ICD-10-CM | POA: Diagnosis not present

## 2021-11-04 DIAGNOSIS — H9319 Tinnitus, unspecified ear: Secondary | ICD-10-CM | POA: Diagnosis not present

## 2021-11-04 DIAGNOSIS — H903 Sensorineural hearing loss, bilateral: Secondary | ICD-10-CM | POA: Diagnosis not present

## 2021-11-04 DIAGNOSIS — H9190 Unspecified hearing loss, unspecified ear: Secondary | ICD-10-CM | POA: Diagnosis not present

## 2021-11-07 DIAGNOSIS — E1169 Type 2 diabetes mellitus with other specified complication: Secondary | ICD-10-CM | POA: Diagnosis not present

## 2021-11-07 DIAGNOSIS — E785 Hyperlipidemia, unspecified: Secondary | ICD-10-CM | POA: Diagnosis not present

## 2021-11-15 DIAGNOSIS — D6869 Other thrombophilia: Secondary | ICD-10-CM | POA: Diagnosis not present

## 2021-11-15 DIAGNOSIS — E1142 Type 2 diabetes mellitus with diabetic polyneuropathy: Secondary | ICD-10-CM | POA: Diagnosis not present

## 2021-11-15 DIAGNOSIS — E785 Hyperlipidemia, unspecified: Secondary | ICD-10-CM | POA: Diagnosis not present

## 2021-11-15 DIAGNOSIS — I480 Paroxysmal atrial fibrillation: Secondary | ICD-10-CM | POA: Diagnosis not present

## 2021-11-15 DIAGNOSIS — G5621 Lesion of ulnar nerve, right upper limb: Secondary | ICD-10-CM | POA: Diagnosis not present

## 2021-11-15 DIAGNOSIS — E7800 Pure hypercholesterolemia, unspecified: Secondary | ICD-10-CM | POA: Diagnosis not present

## 2021-11-15 DIAGNOSIS — K521 Toxic gastroenteritis and colitis: Secondary | ICD-10-CM | POA: Diagnosis not present

## 2021-11-15 DIAGNOSIS — I10 Essential (primary) hypertension: Secondary | ICD-10-CM | POA: Diagnosis not present

## 2021-11-15 DIAGNOSIS — S069X9S Unspecified intracranial injury with loss of consciousness of unspecified duration, sequela: Secondary | ICD-10-CM | POA: Diagnosis not present

## 2021-11-15 DIAGNOSIS — Z6841 Body Mass Index (BMI) 40.0 and over, adult: Secondary | ICD-10-CM | POA: Diagnosis not present

## 2021-11-15 DIAGNOSIS — G63 Polyneuropathy in diseases classified elsewhere: Secondary | ICD-10-CM | POA: Diagnosis not present

## 2021-11-15 DIAGNOSIS — E1169 Type 2 diabetes mellitus with other specified complication: Secondary | ICD-10-CM | POA: Diagnosis not present

## 2021-11-21 DIAGNOSIS — H903 Sensorineural hearing loss, bilateral: Secondary | ICD-10-CM | POA: Diagnosis not present

## 2021-11-27 DIAGNOSIS — K521 Toxic gastroenteritis and colitis: Secondary | ICD-10-CM | POA: Diagnosis not present

## 2021-11-29 DIAGNOSIS — I10 Essential (primary) hypertension: Secondary | ICD-10-CM | POA: Diagnosis not present

## 2021-11-29 DIAGNOSIS — E1142 Type 2 diabetes mellitus with diabetic polyneuropathy: Secondary | ICD-10-CM | POA: Diagnosis not present

## 2021-11-29 DIAGNOSIS — H903 Sensorineural hearing loss, bilateral: Secondary | ICD-10-CM | POA: Diagnosis not present

## 2021-11-29 DIAGNOSIS — Z7984 Long term (current) use of oral hypoglycemic drugs: Secondary | ICD-10-CM | POA: Diagnosis not present

## 2021-12-20 ENCOUNTER — Ambulatory Visit: Payer: PPO | Admitting: Internal Medicine

## 2021-12-20 ENCOUNTER — Encounter: Payer: Self-pay | Admitting: Internal Medicine

## 2021-12-20 VITALS — BP 111/67 | HR 57 | Ht 68.0 in | Wt 279.8 lb

## 2021-12-20 DIAGNOSIS — I700 Atherosclerosis of aorta: Secondary | ICD-10-CM | POA: Diagnosis not present

## 2021-12-20 DIAGNOSIS — I2584 Coronary atherosclerosis due to calcified coronary lesion: Secondary | ICD-10-CM

## 2021-12-20 DIAGNOSIS — I480 Paroxysmal atrial fibrillation: Secondary | ICD-10-CM | POA: Diagnosis not present

## 2021-12-20 DIAGNOSIS — I2510 Atherosclerotic heart disease of native coronary artery without angina pectoris: Secondary | ICD-10-CM

## 2021-12-20 DIAGNOSIS — Z86711 Personal history of pulmonary embolism: Secondary | ICD-10-CM | POA: Diagnosis not present

## 2021-12-20 DIAGNOSIS — R079 Chest pain, unspecified: Secondary | ICD-10-CM

## 2021-12-20 IMAGING — DX DG CHEST 1V PORT
1 series · 1 of 1 positions shown · non-contrast
Comparison: January 16, 2021.

CLINICAL DATA: Dialysis catheter placement.

EXAM:
PORTABLE CHEST 1 VIEW

[chest ap]
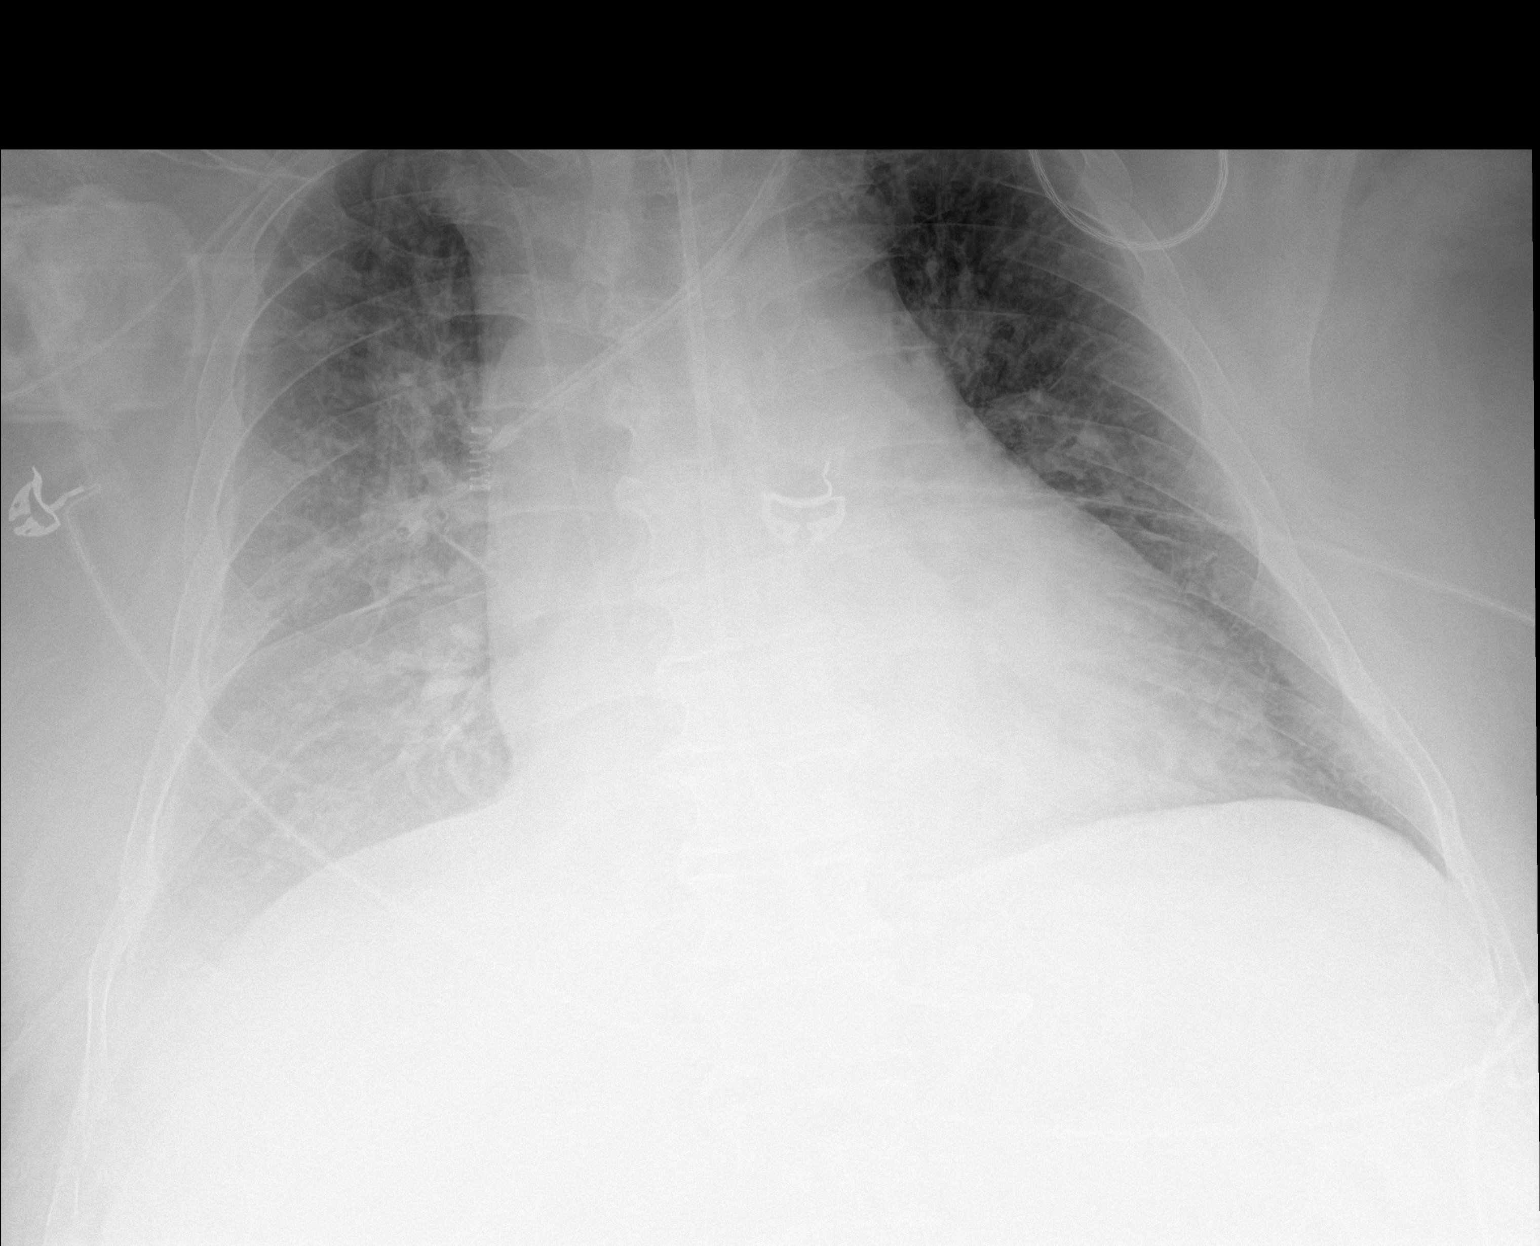

[1 of 1 positions shown; findings below may reference images not displayed]

FINDINGS: Stable cardiomegaly. Tracheostomy tube is unchanged in position.
Feeding tube is seen entering stomach. Interval placement of left
subclavian catheter with distal tip in expected position of the SVC.
Right-sided PICC line is unchanged. No pneumothorax is noted. Right
basilar opacity is noted concerning for atelectasis and effusion.
Bony thorax is unremarkable.
IMPRESSION: Interval placement of left subclavian catheter with distal tip in
expected position of the SVC. No pneumothorax is noted. These
results were called by telephone at the time of interpretation on
01/17/2021 at [DATE] to provider PUXA NARIMAB , who verbally
acknowledged these results.

## 2021-12-20 MED ORDER — METHOCARBAMOL 500 MG PO TABS: 1000.0000 mg | ORAL_TABLET | Freq: Three times a day (TID) | ORAL | 0 refills | Status: AC
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
## 2021-12-20 NOTE — Patient Instructions (Signed)
Medication Instructions:  Your physician has recommended you make the following change in your medication:  STOP: metoprolol  *If you need a refill on your cardiac medications before your next appointment, please call your pharmacy*   Lab Work: NONE If you have labs (blood work) drawn today and your tests are completely normal, you will receive your results only by: [place] (if you have MyChart) OR A paper copy in the mail If you have any lab test that is abnormal or we need to change your treatment, we will call you to review the results.   Testing/Procedures: Your physician has requested that you have en exercise stress myoview. For further information please visit [email or web address]. Please follow instruction sheet, as given.    You are scheduled for a Myocardial Perfusion Imaging Study. Please arrive 15 minutes prior to your appointment time for registration and insurance purposes.   The test will take approximately 3 to 4 hours to complete; you may bring reading material.  If someone comes with you to your appointment, they will need to remain in the main lobby due to limited space in the testing area.    How to prepare for your Myocardial Perfusion Test: Do not eat or drink 3 hours prior to your test, except you may have water. Do not consume products containing caffeine (regular or decaffeinated) 12 hours prior to your test. (ex: coffee, chocolate, sodas, tea). Do bring a list of your current medications with you.  If not listed below, you may take your medications as normal. Do wear comfortable clothes (no dresses or overalls) and walking shoes, tennis shoes preferred (No heels or open toe shoes are allowed). Do NOT wear cologne, perfume, aftershave, or lotions (deodorant is allowed). If these instructions are not followed, your test will have to be rescheduled.  If you cannot keep your appointment, please provide 24 hours notification to the Nuclear Lab, to avoid a  possible $50 charge to your account.       Follow-Up: At [facility], you and your health needs are our priority.  As part of our continuing mission to provide you with exceptional heart care, we have created designated Provider Care Teams.  These Care Teams include your primary Cardiologist (physician) and Advanced Practice Providers (APPs -  Physician Assistants and Nurse Practitioners) who all work together to provide you with the care you need, when you need it.   Your next appointment:   8 month(s)  The format for your next appointment:   In Person  Provider:   Rudean Haskell, MD {   Important Information About Sugar

## 2021-12-24 ENCOUNTER — Telehealth: Payer: Self-pay | Admitting: *Deleted

## 2021-12-24 NOTE — Telephone Encounter (Signed)
Left message on voicemail per DPR in reference to upcoming appointment scheduled on 12/31/21 at 1130 with detailed instructions given per Myocardial Perfusion Study Information Sheet for the test. LM to arrive 15 minutes early, and that it is imperative to arrive on time for appointment to keep from having the test rescheduled. If you need to cancel or reschedule your appointment, please call the office within 24 hours of your appointment. Failure to do so may result in a cancellation of your appointment, and a $50 no show fee. Phone number given for call back for any questions. Tenasia Aull, Ranae Palms

## 2021-12-26 IMAGING — DX DG CHEST 1V PORT
1 series · 1 of 1 positions shown · non-contrast
Comparison: 01/17/2021

CLINICAL DATA: Respiratory failure, tracheostomy

EXAM:
PORTABLE CHEST 1 VIEW

[chest]
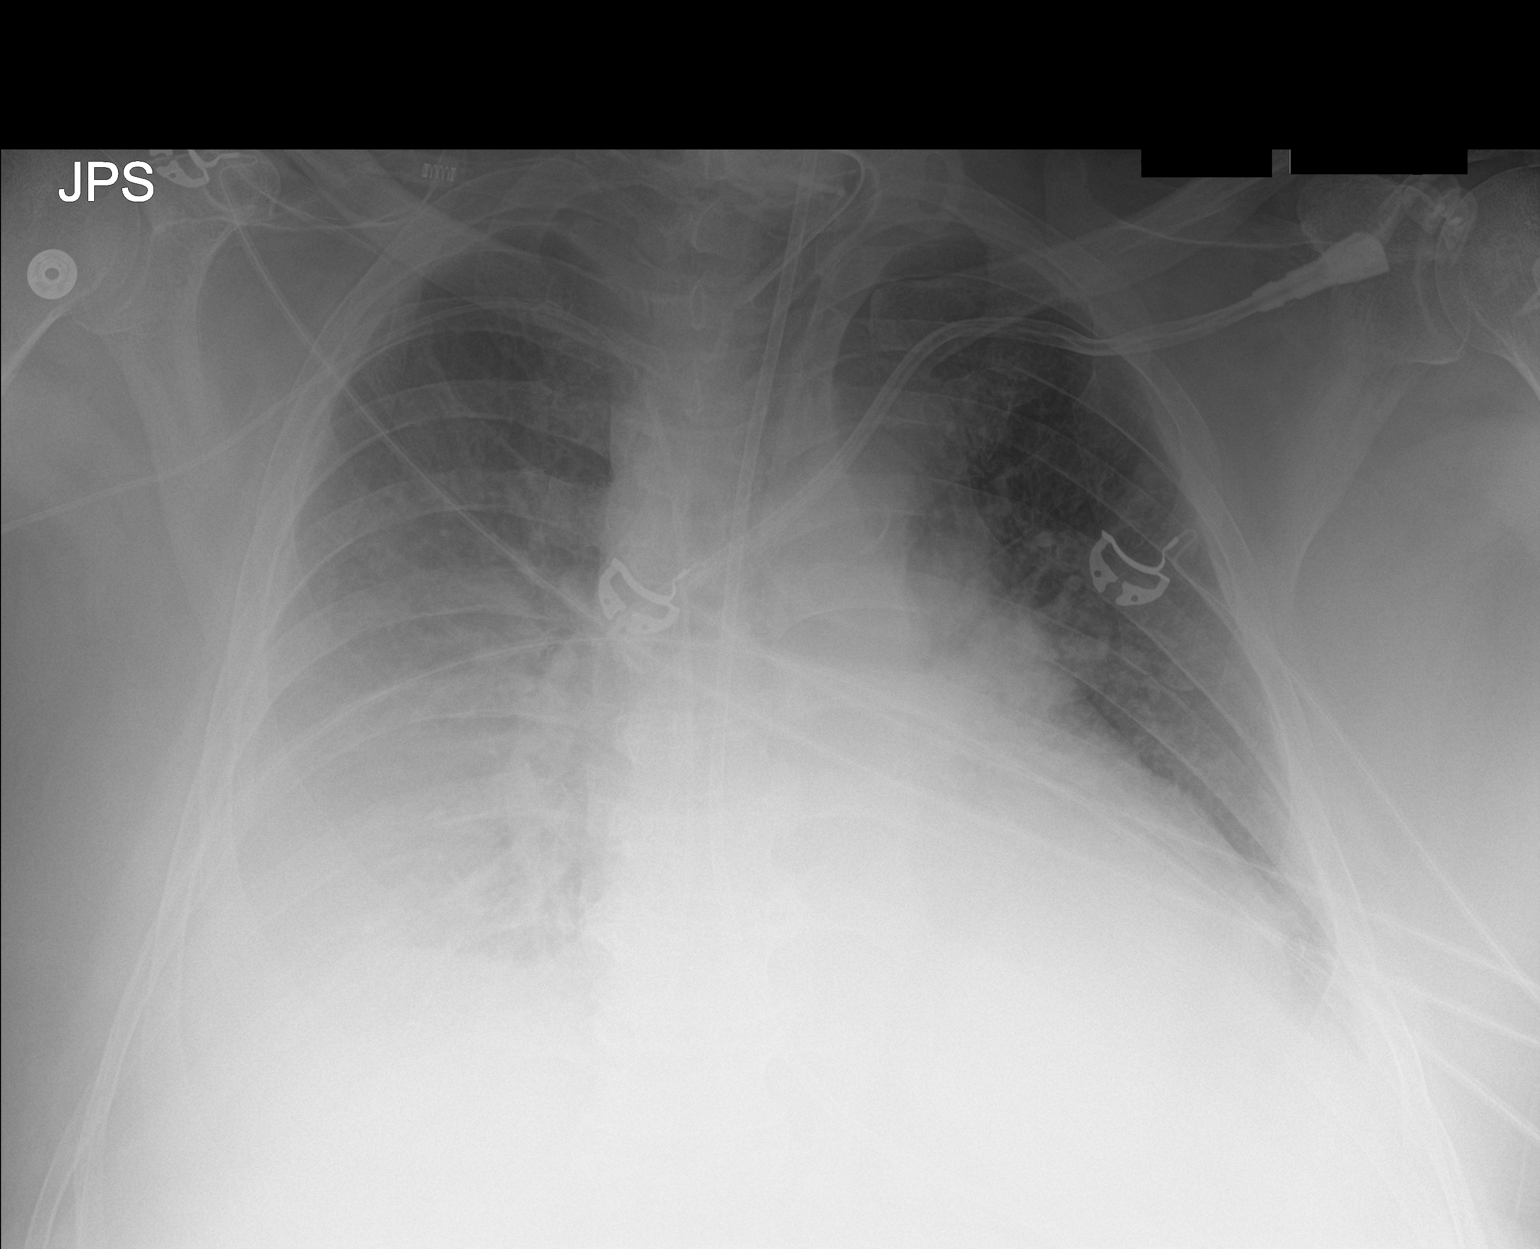

[1 of 1 positions shown; findings below may reference images not displayed]

FINDINGS: No significant interval change in AP portable chest radiograph, with
gross cardiomegaly and so port apparatus including tracheostomy,
left chest large bore multi lumen vascular catheter, and right upper
extremity PICC, tip again projecting over the right atrium.
Partially imaged enteric feeding tube. Diffuse bilateral
interstitial opacity and layering bilateral pleural effusions. No
new airspace opacity.
IMPRESSION: 1. No significant interval change in AP portable chest radiograph,
with gross cardiomegaly, diffuse bilateral interstitial pulmonary
opacity, and layering pleural effusions, constellation of findings
most consistent with edema.

2. Unchanged support apparatus including tracheostomy, left chest
large bore multi lumen vascular catheter, right upper extremity
PICC, and partially imaged enteric feeding tube.

## 2021-12-26 IMAGING — DX DG CHEST 1V PORT
1 series · 1 of 1 positions shown · non-contrast
Comparison: Same day radiograph

CLINICAL DATA: Central line placement

EXAM:
PORTABLE CHEST 1 VIEW

[chest]
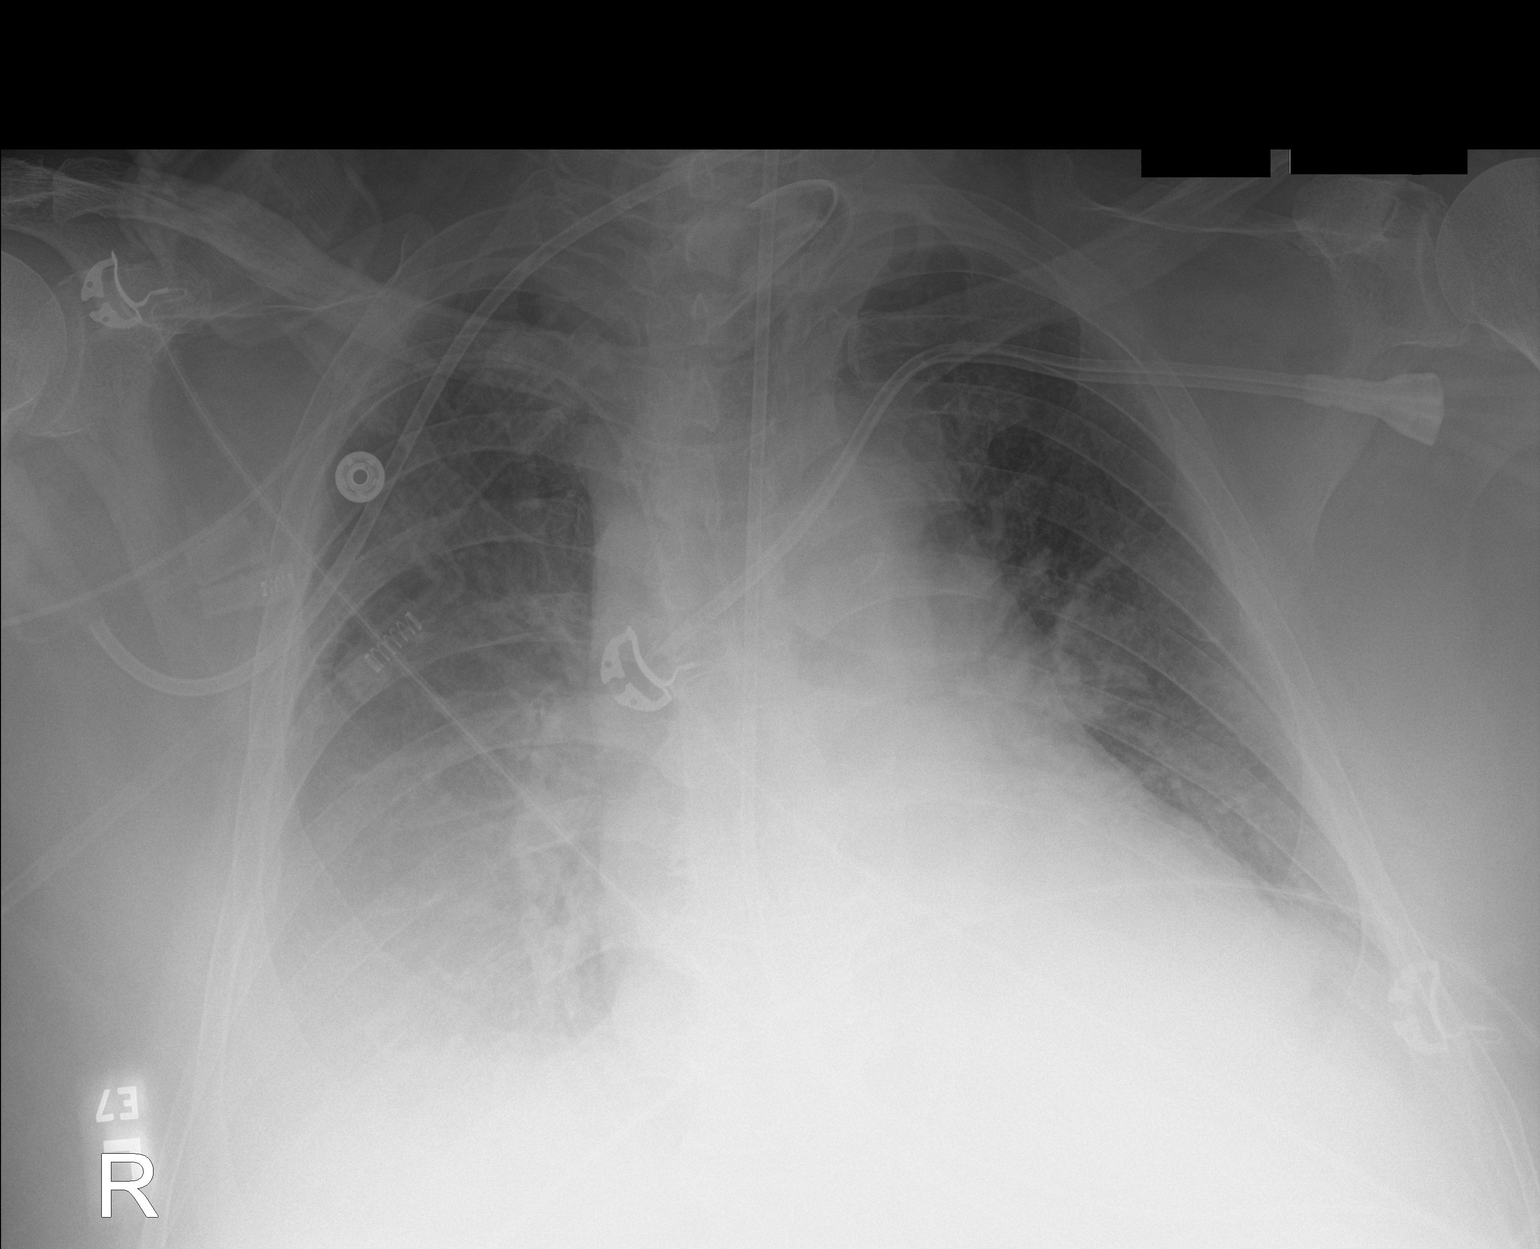

[1 of 1 positions shown; findings below may reference images not displayed]

FINDINGS: Unchanged cardiomediastinal silhouette. Unchanged tracheostomy tube,
right upper extremity PICC, and partially imaged enteric feeding
tube. Persistent bilateral effusions and diffuse bilateral
interstitial opacities. No visible pneumothorax. Bones are
unchanged. The left approach central venous catheter tip overlies
the distal superior vena cava as clinically questioned.
IMPRESSION: Left upper central venous catheter tip overlies the distal superior
vena cava.

Unchanged pulmonary edema and layering bilateral pleural effusions.

## 2021-12-30 ENCOUNTER — Other Ambulatory Visit: Payer: Self-pay | Admitting: Internal Medicine

## 2021-12-30 DIAGNOSIS — R079 Chest pain, unspecified: Secondary | ICD-10-CM

## 2021-12-30 IMAGING — CT CT HEAD W/O CM
3 series · 17 of 37 positions shown, 19 images · non-contrast
Comparison: Head CT dated 12/29/2020.

CLINICAL DATA: Altered mental status.

EXAM:
CT HEAD WITHOUT CONTRAST
TECHNIQUE: Contiguous axial images were obtained from the base of the skull
through the vertex without intravenous contrast.

[Series 2: head wo · axial · 0.44mm/px · z∈[-482,-342]mm · 7 of 38 slices shown, 9 images]
[im 5/38  brain]
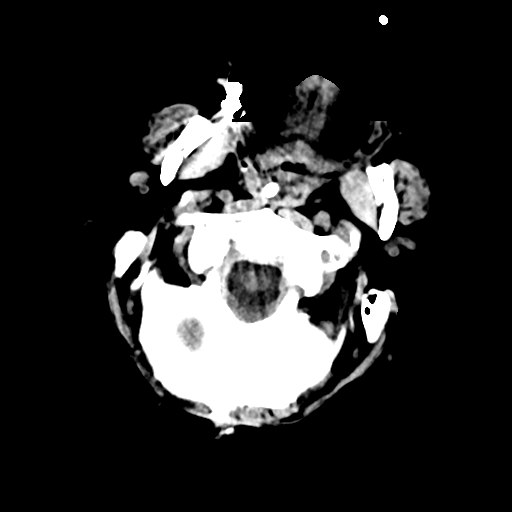
[im 5/38  bone]
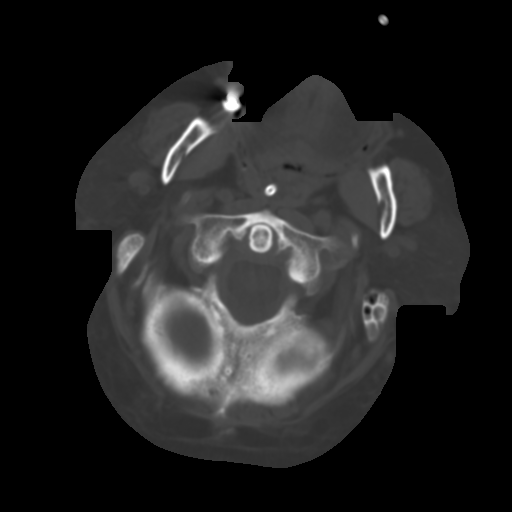
[im 10/38  brain]
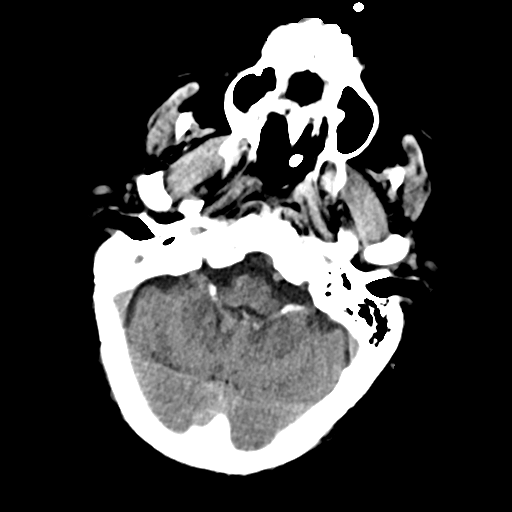
[im 14/38  brain]
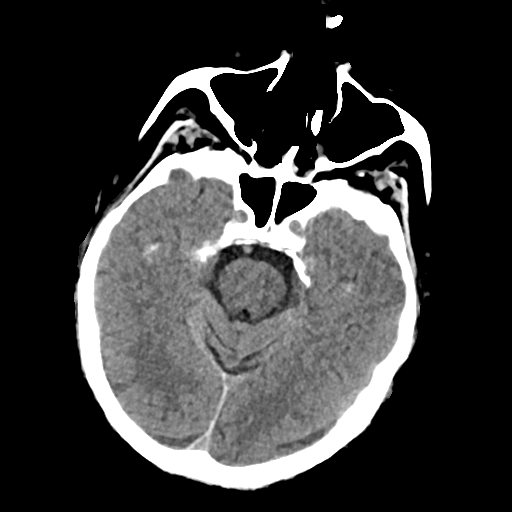
[im 19/38  brain]
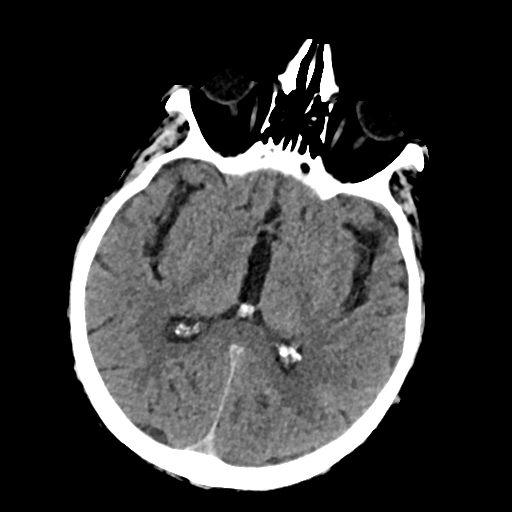
[im 24/38  brain]
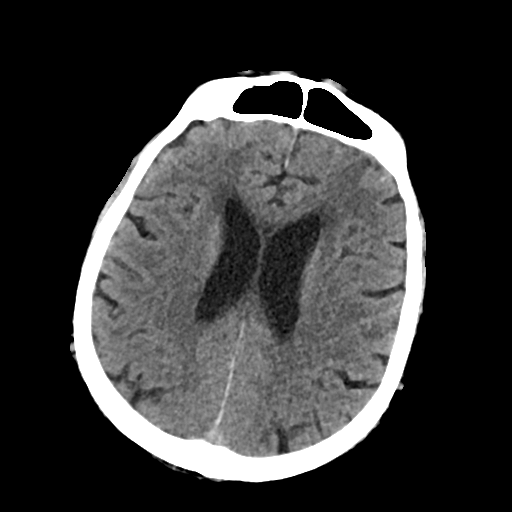
[im 24/38  bone]
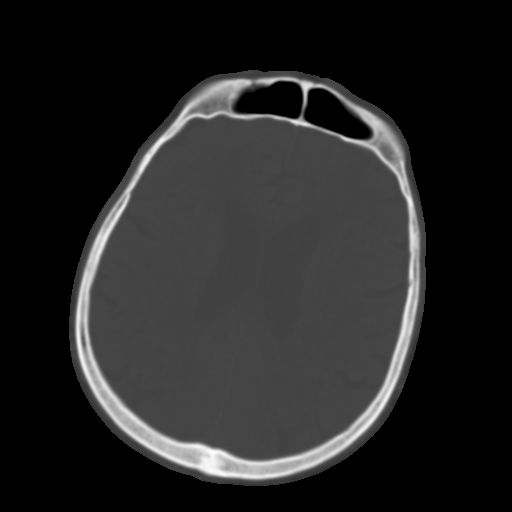
[im 28/38  brain]
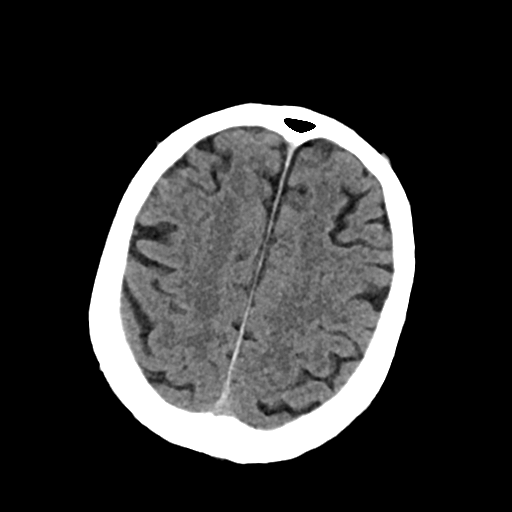
[im 33/38  brain]
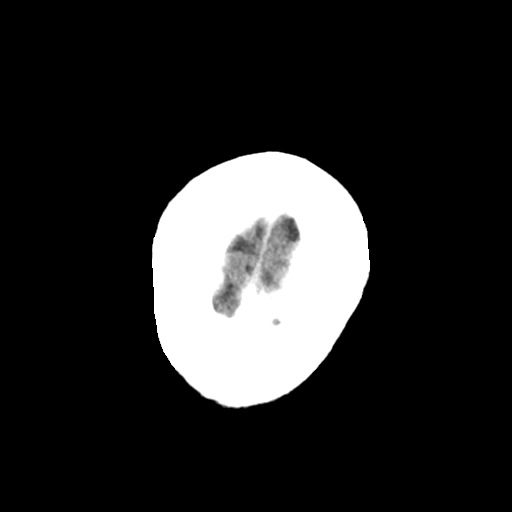

[Series 3: head bone · axial · 0.44mm/px · z∈[-484,-354]mm · 7 of 94 slices shown]
[im 10/94  bone]
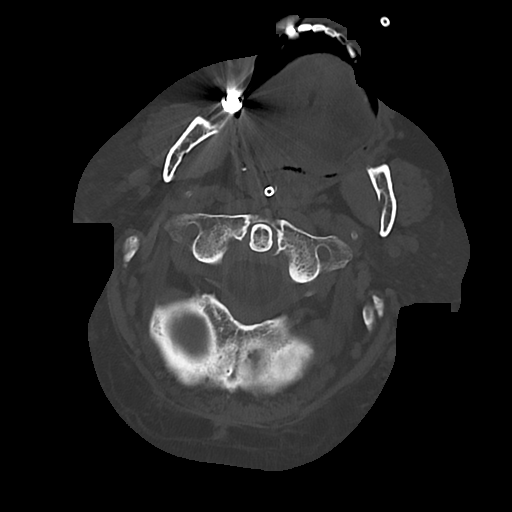
[im 19/94  bone]
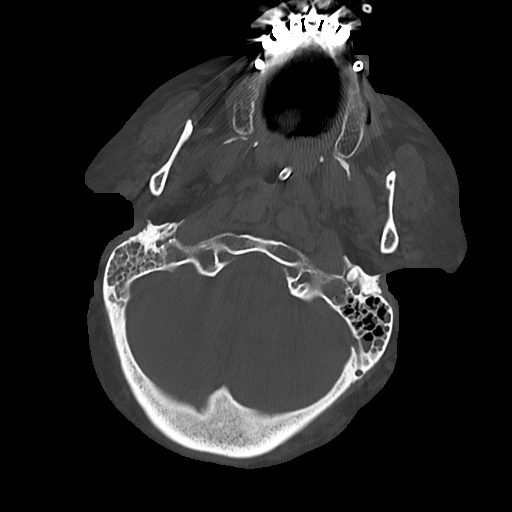
[im 28/94  bone]
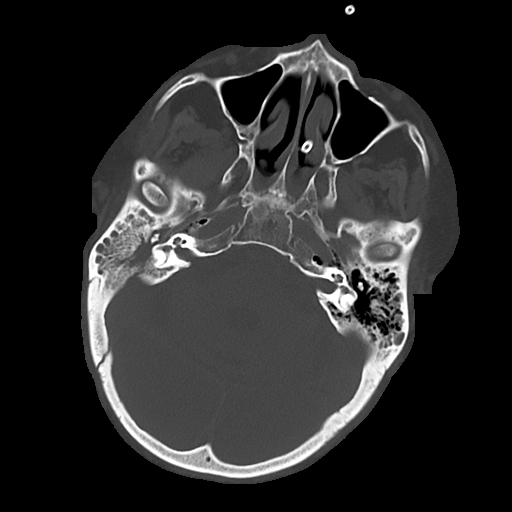
[im 42/94  bone]
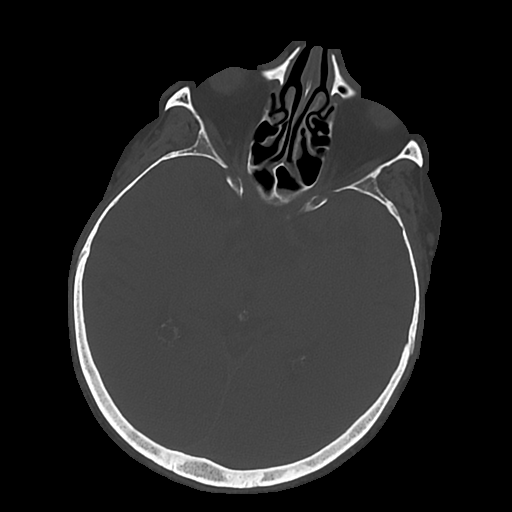
[im 52/94  bone]
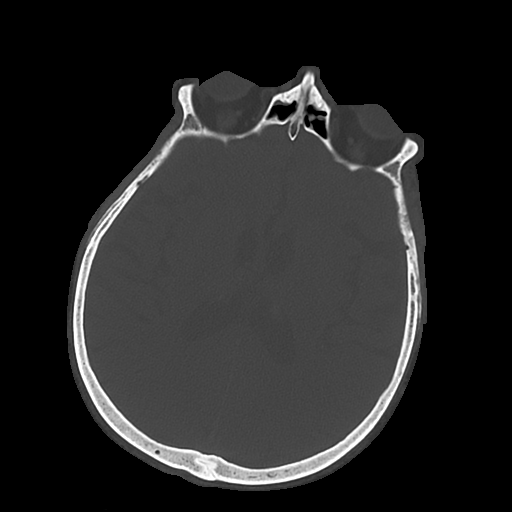
[im 66/94  bone]
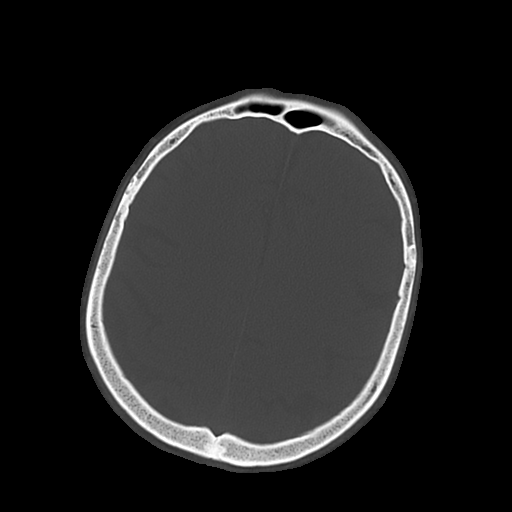
[im 75/94  bone]
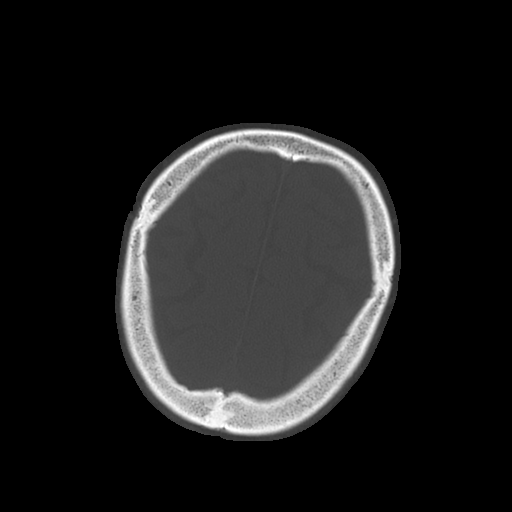

[Series 5: sag soft · sagittal · 0.41mm/px · 3 of 67 slices shown]
[im 23/67  brain]
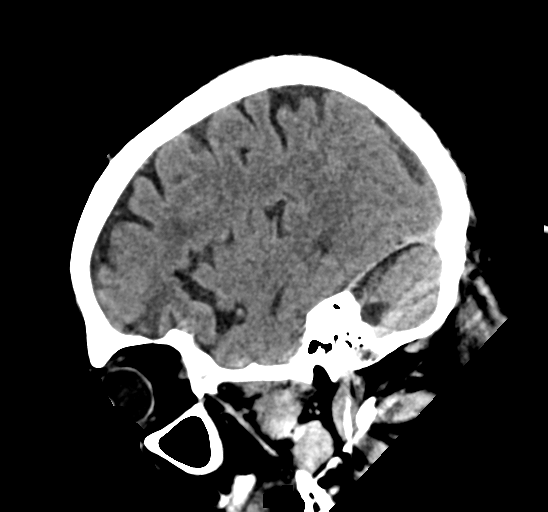
[im 34/67  brain]
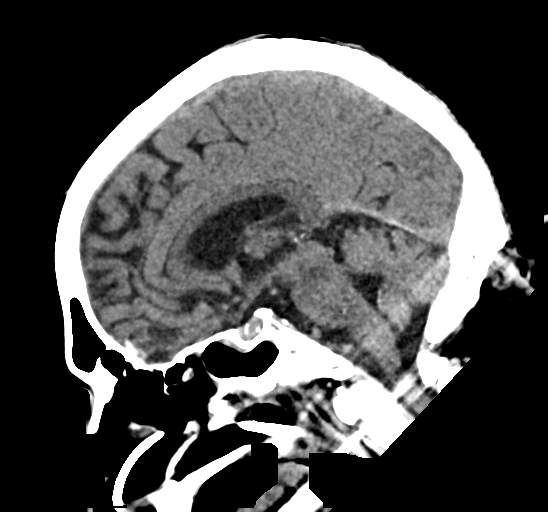
[im 45/67  brain]
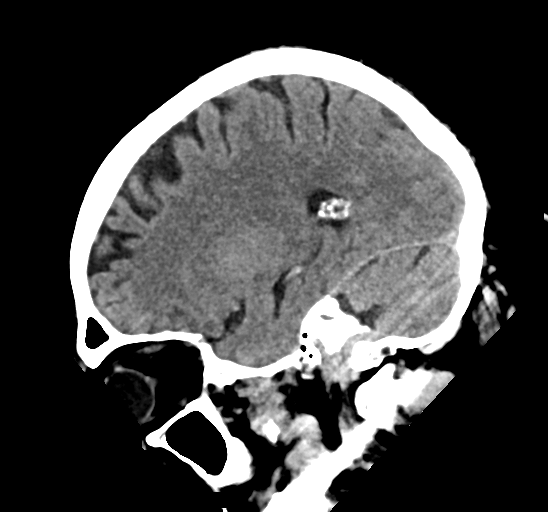

[17 of 37 positions shown; findings below may reference images not displayed]

FINDINGS: Brain: Mild age-related atrophy and chronic microvascular ischemic
changes. Small hypodense extra-axial fluid in the subdural spaces
along the occipital lobes measuring up to 6 mm in thickness on the
left likely represent subacute or chronic bleed or hygroma or
possibly sequela of previously seen intracranial hemorrhage. No
acute intracranial hemorrhage. No midline shift.

Vascular: No hyperdense vessel or unexpected calcification.

Skull: Normal. Negative for fracture or focal lesion.

Sinuses/Orbits: There is mild diffuse mucoperiosteal thickening of
paranasal sinuses. Partially visualized nasogastric tube. Bilateral
mastoid effusions, right greater than left.

Other: None
IMPRESSION: 1. No acute intracranial hemorrhage. Probable small old subdural
collection or hygroma along the occipital lobes.
2. Mild age-related atrophy and chronic microvascular ischemic
changes.
3. Bilateral mastoid effusions, right greater than left.

## 2021-12-30 IMAGING — DX DG ABD PORTABLE 1V
1 series · 4 of 4 positions shown · non-contrast
Comparison: 01/16/2021

CLINICAL DATA: Vomiting.  Fell down stairs.

EXAM:
PORTABLE ABDOMEN - 1 VIEW

[Series 1: abdomen · 0.14mm/px · 4 of 4 slices shown]
[im 1/4]
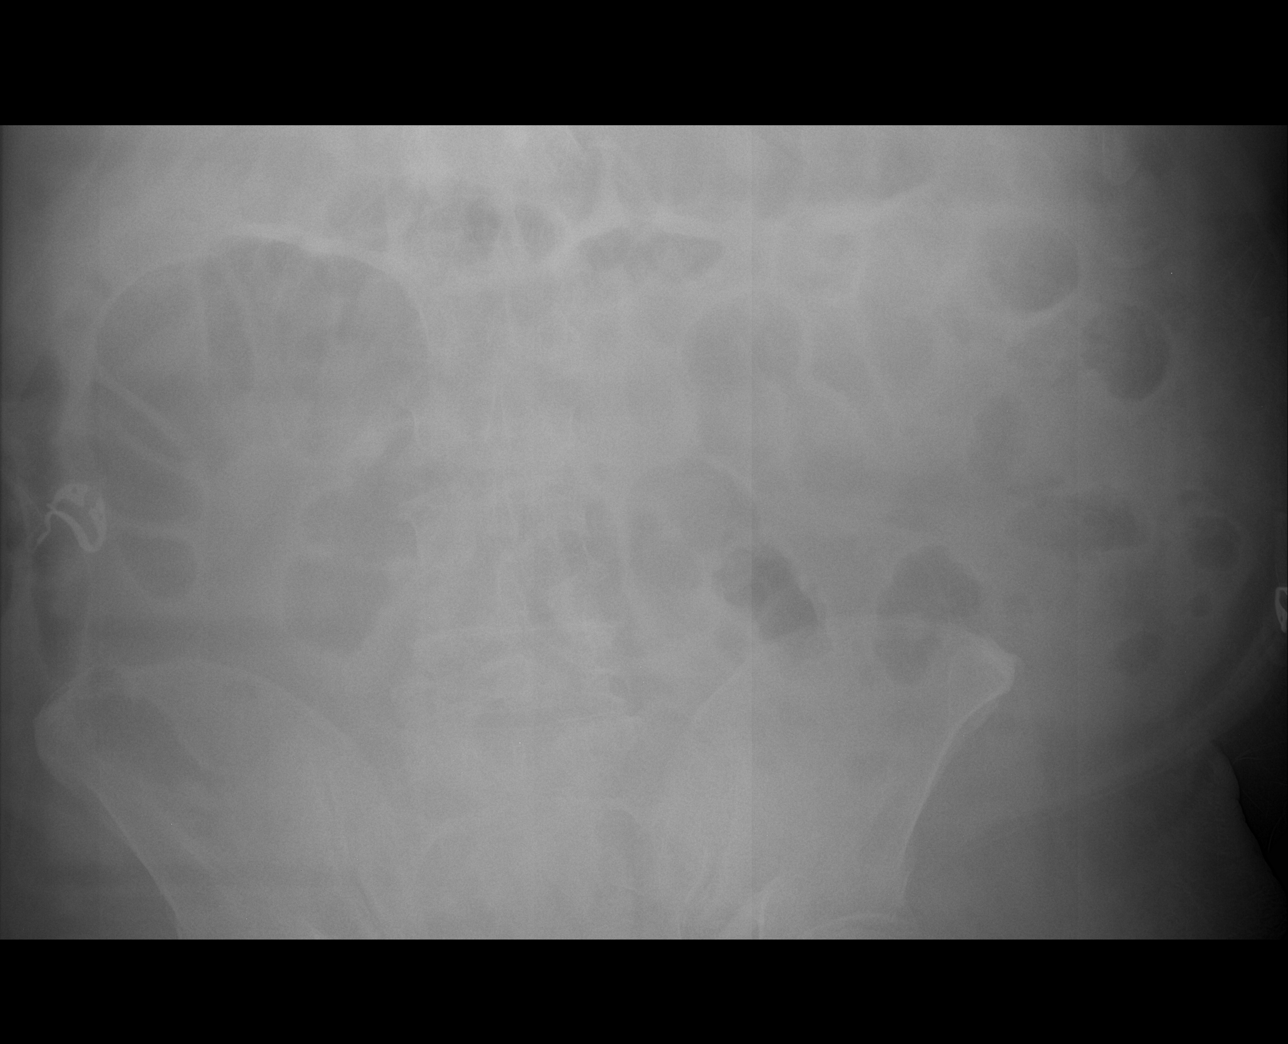
[im 2/4]
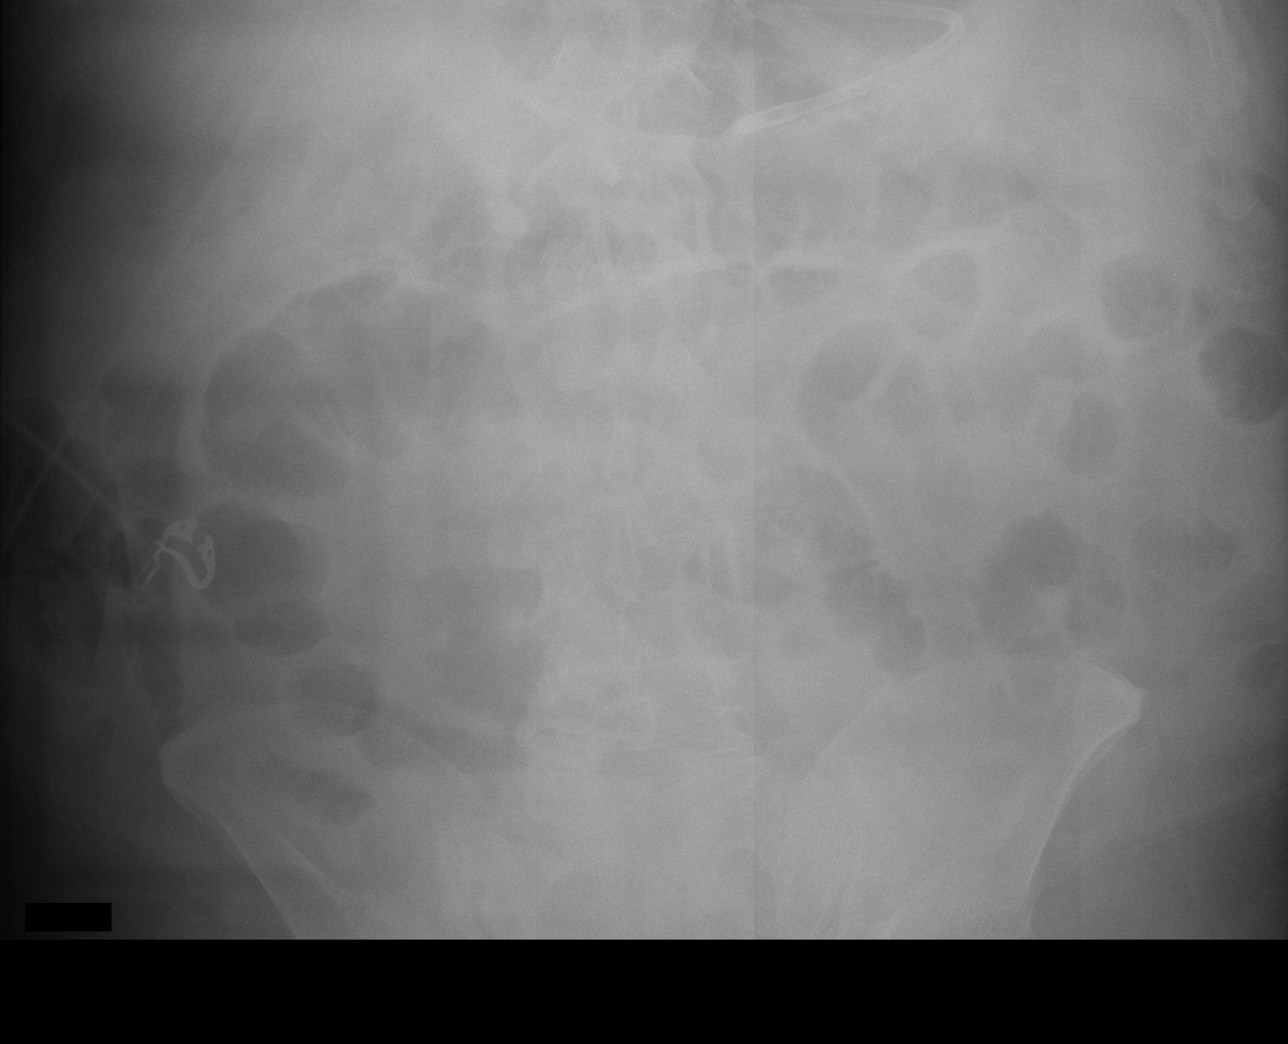
[im 3/4]
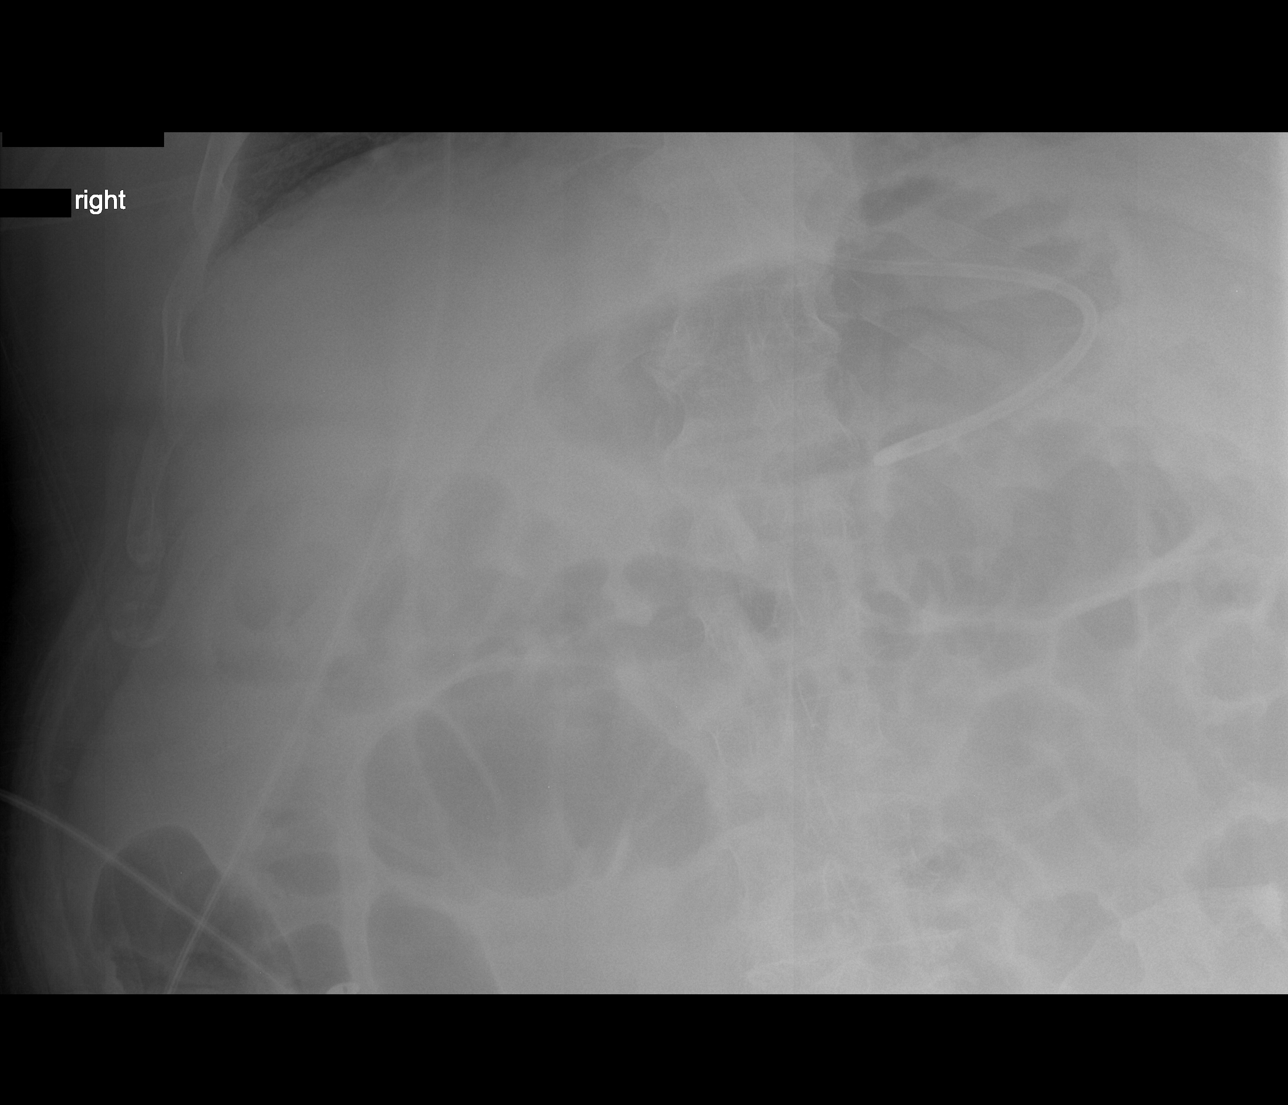
[im 4/4]
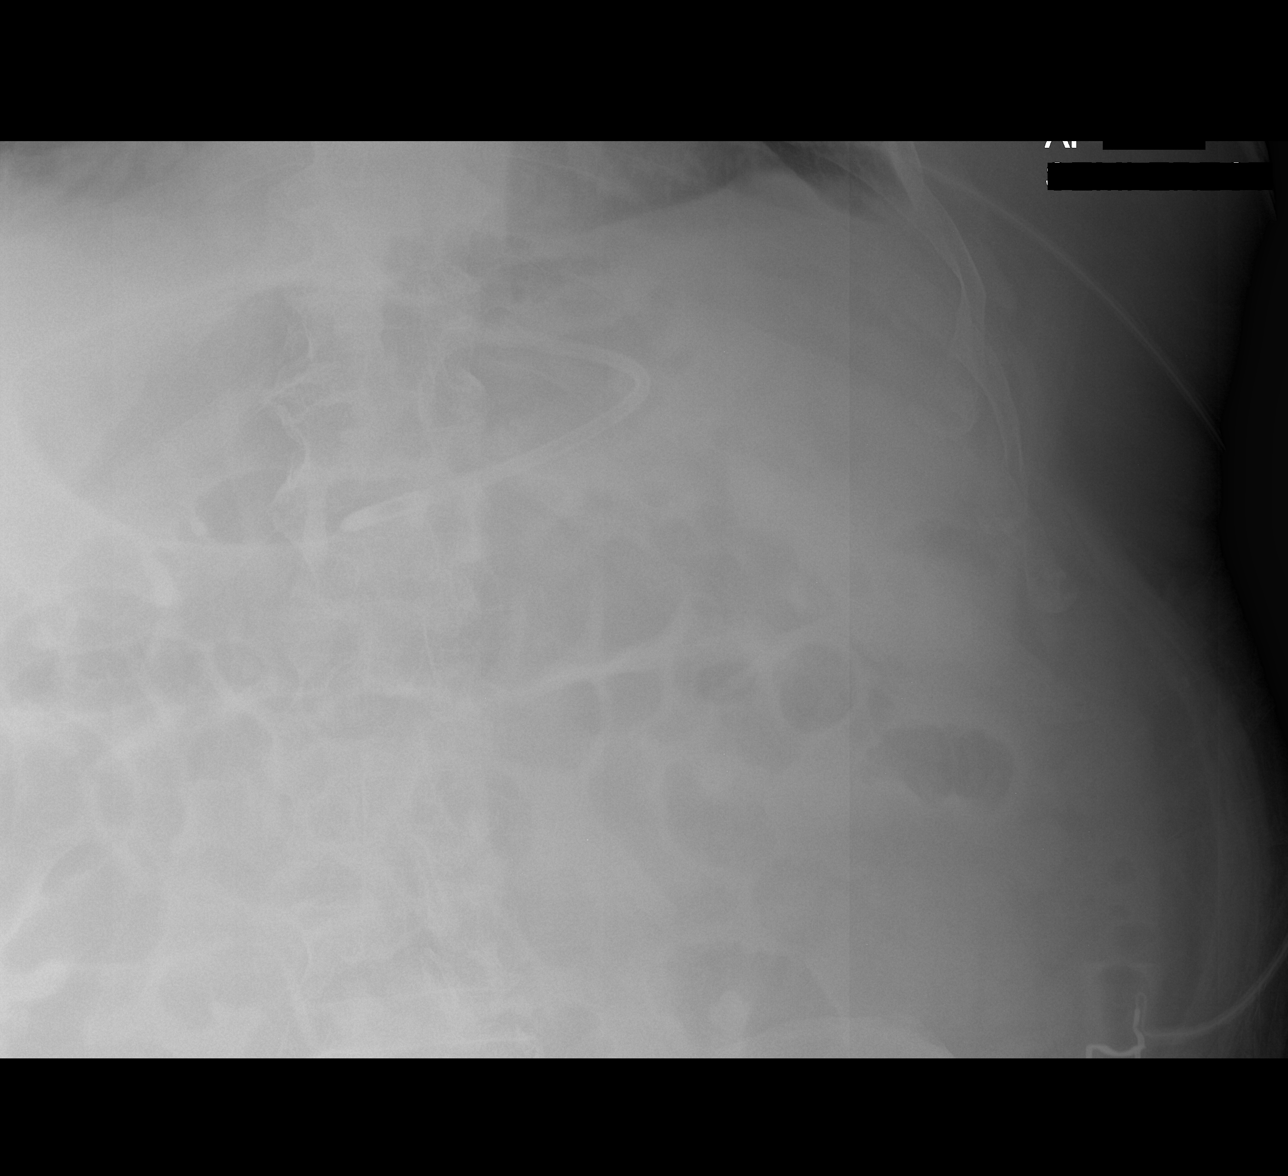

[4 of 4 positions shown; findings below may reference images not displayed]

FINDINGS: Normal bowel gas pattern. Feeding tube tip in the mid stomach. No
visible free peritoneal air. Lumbar and lower thoracic spine
degenerative changes.
IMPRESSION: Feeding tube tip in the mid stomach.  No acute abnormality.

## 2021-12-31 ENCOUNTER — Ambulatory Visit (INDEPENDENT_AMBULATORY_CARE_PROVIDER_SITE_OTHER): Payer: PPO

## 2021-12-31 DIAGNOSIS — R079 Chest pain, unspecified: Secondary | ICD-10-CM

## 2021-12-31 MED ORDER — REGADENOSON 0.4 MG/5ML IV SOLN
0.4000 mg | Freq: Once | INTRAVENOUS | Status: AC
  Administered 2021-12-31: 0.4 mg via INTRAVENOUS

## 2021-12-31 MED ORDER — TECHNETIUM TC 99M TETROFOSMIN IV KIT
31.9000 | PACK | Freq: Once | INTRAVENOUS | Status: AC | PRN
  Administered 2021-12-31: 31.9 via INTRAVENOUS

## 2022-01-01 ENCOUNTER — Ambulatory Visit: Payer: PPO

## 2022-01-01 LAB — MYOCARDIAL PERFUSION IMAGING
LV dias vol: 94 mL (ref 62–150)
LV sys vol: 31 mL
Nuc Stress EF: 67 %
Peak HR: 107 {beats}/min
Rest HR: 75 {beats}/min
Rest Nuclear Isotope Dose: 32.7 mCi
SDS: 6
SRS: 0
SSS: 6
Stress Nuclear Isotope Dose: 31.9 mCi
TID: 0.95

## 2022-01-01 MED ORDER — TECHNETIUM TC 99M TETROFOSMIN IV KIT
32.7000 | PACK | Freq: Once | INTRAVENOUS | Status: AC | PRN
  Administered 2022-01-01: 32.7 via INTRAVENOUS

## 2022-01-16 DIAGNOSIS — I10 Essential (primary) hypertension: Secondary | ICD-10-CM | POA: Diagnosis not present

## 2022-01-16 DIAGNOSIS — E785 Hyperlipidemia, unspecified: Secondary | ICD-10-CM | POA: Diagnosis not present

## 2022-01-31 ENCOUNTER — Telehealth: Payer: Self-pay

## 2022-01-31 NOTE — Telephone Encounter (Signed)
[facility] sent a refill request for Ferrous Sulfate. Sent a denial stating to send to PCP Dr. Venetia Maxon

## 2022-02-15 DIAGNOSIS — E1169 Type 2 diabetes mellitus with other specified complication: Secondary | ICD-10-CM | POA: Diagnosis not present

## 2022-02-15 DIAGNOSIS — I10 Essential (primary) hypertension: Secondary | ICD-10-CM | POA: Diagnosis not present

## 2022-02-15 DIAGNOSIS — I480 Paroxysmal atrial fibrillation: Secondary | ICD-10-CM | POA: Diagnosis not present

## 2022-02-28 DIAGNOSIS — E1142 Type 2 diabetes mellitus with diabetic polyneuropathy: Secondary | ICD-10-CM | POA: Diagnosis not present

## 2022-02-28 DIAGNOSIS — Z7984 Long term (current) use of oral hypoglycemic drugs: Secondary | ICD-10-CM | POA: Diagnosis not present

## 2022-02-28 DIAGNOSIS — Z6841 Body Mass Index (BMI) 40.0 and over, adult: Secondary | ICD-10-CM | POA: Diagnosis not present

## 2022-02-28 DIAGNOSIS — I10 Essential (primary) hypertension: Secondary | ICD-10-CM | POA: Diagnosis not present

## 2022-03-12 DIAGNOSIS — Z23 Encounter for immunization: Secondary | ICD-10-CM | POA: Diagnosis not present

## 2022-03-18 DIAGNOSIS — E1129 Type 2 diabetes mellitus with other diabetic kidney complication: Secondary | ICD-10-CM | POA: Diagnosis not present

## 2022-03-18 DIAGNOSIS — I480 Paroxysmal atrial fibrillation: Secondary | ICD-10-CM | POA: Diagnosis not present

## 2022-03-18 DIAGNOSIS — I10 Essential (primary) hypertension: Secondary | ICD-10-CM | POA: Diagnosis not present

## 2022-03-24 DIAGNOSIS — E1165 Type 2 diabetes mellitus with hyperglycemia: Secondary | ICD-10-CM | POA: Diagnosis not present

## 2022-03-24 DIAGNOSIS — E785 Hyperlipidemia, unspecified: Secondary | ICD-10-CM | POA: Diagnosis not present

## 2022-03-24 DIAGNOSIS — E1169 Type 2 diabetes mellitus with other specified complication: Secondary | ICD-10-CM | POA: Diagnosis not present

## 2022-04-08 DIAGNOSIS — E1169 Type 2 diabetes mellitus with other specified complication: Secondary | ICD-10-CM | POA: Diagnosis not present

## 2022-04-08 DIAGNOSIS — E785 Hyperlipidemia, unspecified: Secondary | ICD-10-CM | POA: Diagnosis not present

## 2022-04-17 DIAGNOSIS — I10 Essential (primary) hypertension: Secondary | ICD-10-CM | POA: Diagnosis not present

## 2022-04-17 DIAGNOSIS — E1169 Type 2 diabetes mellitus with other specified complication: Secondary | ICD-10-CM | POA: Diagnosis not present

## 2022-04-17 DIAGNOSIS — I480 Paroxysmal atrial fibrillation: Secondary | ICD-10-CM | POA: Diagnosis not present

## 2022-04-22 DIAGNOSIS — E1142 Type 2 diabetes mellitus with diabetic polyneuropathy: Secondary | ICD-10-CM | POA: Diagnosis not present

## 2022-04-22 DIAGNOSIS — Z7984 Long term (current) use of oral hypoglycemic drugs: Secondary | ICD-10-CM | POA: Diagnosis not present

## 2022-04-22 DIAGNOSIS — L602 Onychogryphosis: Secondary | ICD-10-CM | POA: Diagnosis not present

## 2022-05-15 DIAGNOSIS — E785 Hyperlipidemia, unspecified: Secondary | ICD-10-CM | POA: Diagnosis not present

## 2022-05-15 DIAGNOSIS — E1169 Type 2 diabetes mellitus with other specified complication: Secondary | ICD-10-CM | POA: Diagnosis not present

## 2022-05-15 DIAGNOSIS — Z7689 Persons encountering health services in other specified circumstances: Secondary | ICD-10-CM | POA: Diagnosis not present

## 2022-05-18 DIAGNOSIS — I480 Paroxysmal atrial fibrillation: Secondary | ICD-10-CM | POA: Diagnosis not present

## 2022-05-18 DIAGNOSIS — E1169 Type 2 diabetes mellitus with other specified complication: Secondary | ICD-10-CM | POA: Diagnosis not present

## 2022-05-18 DIAGNOSIS — I10 Essential (primary) hypertension: Secondary | ICD-10-CM | POA: Diagnosis not present

## 2022-05-22 DIAGNOSIS — K219 Gastro-esophageal reflux disease without esophagitis: Secondary | ICD-10-CM | POA: Diagnosis not present

## 2022-05-22 DIAGNOSIS — R7989 Other specified abnormal findings of blood chemistry: Secondary | ICD-10-CM | POA: Diagnosis not present

## 2022-05-22 DIAGNOSIS — E119 Type 2 diabetes mellitus without complications: Secondary | ICD-10-CM | POA: Diagnosis not present

## 2022-05-22 DIAGNOSIS — K5790 Diverticulosis of intestine, part unspecified, without perforation or abscess without bleeding: Secondary | ICD-10-CM | POA: Diagnosis not present

## 2022-05-23 DIAGNOSIS — S069X9S Unspecified intracranial injury with loss of consciousness of unspecified duration, sequela: Secondary | ICD-10-CM | POA: Diagnosis not present

## 2022-05-23 DIAGNOSIS — S066XAA Traumatic subarachnoid hemorrhage with loss of consciousness status unknown, initial encounter: Secondary | ICD-10-CM | POA: Diagnosis not present

## 2022-05-23 DIAGNOSIS — D500 Iron deficiency anemia secondary to blood loss (chronic): Secondary | ICD-10-CM | POA: Diagnosis not present

## 2022-05-23 DIAGNOSIS — D6869 Other thrombophilia: Secondary | ICD-10-CM | POA: Diagnosis not present

## 2022-05-23 DIAGNOSIS — E785 Hyperlipidemia, unspecified: Secondary | ICD-10-CM | POA: Diagnosis not present

## 2022-05-23 DIAGNOSIS — I82541 Chronic embolism and thrombosis of right tibial vein: Secondary | ICD-10-CM | POA: Diagnosis not present

## 2022-05-23 DIAGNOSIS — E1169 Type 2 diabetes mellitus with other specified complication: Secondary | ICD-10-CM | POA: Diagnosis not present

## 2022-05-23 DIAGNOSIS — S065XAA Traumatic subdural hemorrhage with loss of consciousness status unknown, initial encounter: Secondary | ICD-10-CM | POA: Diagnosis not present

## 2022-05-23 DIAGNOSIS — E270 Other adrenocortical overactivity: Secondary | ICD-10-CM | POA: Diagnosis not present

## 2022-05-23 DIAGNOSIS — E1142 Type 2 diabetes mellitus with diabetic polyneuropathy: Secondary | ICD-10-CM | POA: Diagnosis not present

## 2022-05-23 DIAGNOSIS — G63 Polyneuropathy in diseases classified elsewhere: Secondary | ICD-10-CM | POA: Diagnosis not present

## 2022-05-23 DIAGNOSIS — I480 Paroxysmal atrial fibrillation: Secondary | ICD-10-CM | POA: Diagnosis not present

## 2022-05-26 DIAGNOSIS — Z7984 Long term (current) use of oral hypoglycemic drugs: Secondary | ICD-10-CM | POA: Diagnosis not present

## 2022-05-26 DIAGNOSIS — K219 Gastro-esophageal reflux disease without esophagitis: Secondary | ICD-10-CM | POA: Diagnosis not present

## 2022-05-26 DIAGNOSIS — I10 Essential (primary) hypertension: Secondary | ICD-10-CM | POA: Diagnosis not present

## 2022-05-26 DIAGNOSIS — Z87891 Personal history of nicotine dependence: Secondary | ICD-10-CM | POA: Diagnosis not present

## 2022-05-26 DIAGNOSIS — E785 Hyperlipidemia, unspecified: Secondary | ICD-10-CM | POA: Diagnosis not present

## 2022-05-26 DIAGNOSIS — Z6841 Body Mass Index (BMI) 40.0 and over, adult: Secondary | ICD-10-CM | POA: Diagnosis not present

## 2022-05-26 DIAGNOSIS — E1142 Type 2 diabetes mellitus with diabetic polyneuropathy: Secondary | ICD-10-CM | POA: Diagnosis not present

## 2022-06-12 DIAGNOSIS — D126 Benign neoplasm of colon, unspecified: Secondary | ICD-10-CM | POA: Diagnosis not present

## 2022-06-12 DIAGNOSIS — Z86718 Personal history of other venous thrombosis and embolism: Secondary | ICD-10-CM | POA: Diagnosis not present

## 2022-06-12 DIAGNOSIS — Z7901 Long term (current) use of anticoagulants: Secondary | ICD-10-CM | POA: Diagnosis not present

## 2022-06-12 DIAGNOSIS — M109 Gout, unspecified: Secondary | ICD-10-CM | POA: Diagnosis not present

## 2022-06-12 DIAGNOSIS — Z79899 Other long term (current) drug therapy: Secondary | ICD-10-CM | POA: Diagnosis not present

## 2022-06-12 DIAGNOSIS — I4891 Unspecified atrial fibrillation: Secondary | ICD-10-CM | POA: Diagnosis not present

## 2022-06-12 DIAGNOSIS — D122 Benign neoplasm of ascending colon: Secondary | ICD-10-CM | POA: Diagnosis not present

## 2022-06-12 DIAGNOSIS — Z86010 Personal history of colonic polyps: Secondary | ICD-10-CM | POA: Diagnosis not present

## 2022-06-12 DIAGNOSIS — K5730 Diverticulosis of large intestine without perforation or abscess without bleeding: Secondary | ICD-10-CM | POA: Diagnosis not present

## 2022-06-12 DIAGNOSIS — I10 Essential (primary) hypertension: Secondary | ICD-10-CM | POA: Diagnosis not present

## 2022-06-12 DIAGNOSIS — K635 Polyp of colon: Secondary | ICD-10-CM | POA: Diagnosis not present

## 2022-06-12 DIAGNOSIS — Z7984 Long term (current) use of oral hypoglycemic drugs: Secondary | ICD-10-CM | POA: Diagnosis not present

## 2022-06-12 DIAGNOSIS — E119 Type 2 diabetes mellitus without complications: Secondary | ICD-10-CM | POA: Diagnosis not present

## 2022-06-18 DIAGNOSIS — I10 Essential (primary) hypertension: Secondary | ICD-10-CM | POA: Diagnosis not present

## 2022-06-18 DIAGNOSIS — E7800 Pure hypercholesterolemia, unspecified: Secondary | ICD-10-CM | POA: Diagnosis not present

## 2022-06-18 DIAGNOSIS — E1169 Type 2 diabetes mellitus with other specified complication: Secondary | ICD-10-CM | POA: Diagnosis not present

## 2022-06-27 DIAGNOSIS — E1169 Type 2 diabetes mellitus with other specified complication: Secondary | ICD-10-CM | POA: Diagnosis not present

## 2022-07-10 DIAGNOSIS — E785 Hyperlipidemia, unspecified: Secondary | ICD-10-CM | POA: Diagnosis not present

## 2022-07-10 DIAGNOSIS — E1169 Type 2 diabetes mellitus with other specified complication: Secondary | ICD-10-CM | POA: Diagnosis not present

## 2022-07-17 DIAGNOSIS — E1169 Type 2 diabetes mellitus with other specified complication: Secondary | ICD-10-CM | POA: Diagnosis not present

## 2022-07-17 DIAGNOSIS — E7800 Pure hypercholesterolemia, unspecified: Secondary | ICD-10-CM | POA: Diagnosis not present

## 2022-07-17 DIAGNOSIS — I10 Essential (primary) hypertension: Secondary | ICD-10-CM | POA: Diagnosis not present

## 2022-07-28 DIAGNOSIS — N400 Enlarged prostate without lower urinary tract symptoms: Secondary | ICD-10-CM | POA: Diagnosis not present

## 2022-07-28 DIAGNOSIS — E876 Hypokalemia: Secondary | ICD-10-CM | POA: Diagnosis not present

## 2022-07-28 DIAGNOSIS — D6869 Other thrombophilia: Secondary | ICD-10-CM | POA: Diagnosis not present

## 2022-07-28 DIAGNOSIS — I480 Paroxysmal atrial fibrillation: Secondary | ICD-10-CM | POA: Diagnosis not present

## 2022-07-28 DIAGNOSIS — M1A00X0 Idiopathic chronic gout, unspecified site, without tophus (tophi): Secondary | ICD-10-CM | POA: Diagnosis not present

## 2022-07-28 DIAGNOSIS — Z79899 Other long term (current) drug therapy: Secondary | ICD-10-CM | POA: Diagnosis not present

## 2022-07-28 DIAGNOSIS — Z0000 Encounter for general adult medical examination without abnormal findings: Secondary | ICD-10-CM | POA: Diagnosis not present

## 2022-07-28 DIAGNOSIS — T50905A Adverse effect of unspecified drugs, medicaments and biological substances, initial encounter: Secondary | ICD-10-CM | POA: Diagnosis not present

## 2022-07-28 DIAGNOSIS — E270 Other adrenocortical overactivity: Secondary | ICD-10-CM | POA: Diagnosis not present

## 2022-07-28 DIAGNOSIS — I10 Essential (primary) hypertension: Secondary | ICD-10-CM | POA: Diagnosis not present

## 2022-07-28 DIAGNOSIS — K2960 Other gastritis without bleeding: Secondary | ICD-10-CM | POA: Diagnosis not present

## 2022-07-28 DIAGNOSIS — E785 Hyperlipidemia, unspecified: Secondary | ICD-10-CM | POA: Diagnosis not present

## 2022-07-28 DIAGNOSIS — E1169 Type 2 diabetes mellitus with other specified complication: Secondary | ICD-10-CM | POA: Diagnosis not present

## 2022-07-30 DIAGNOSIS — D519 Vitamin B12 deficiency anemia, unspecified: Secondary | ICD-10-CM | POA: Diagnosis not present

## 2022-08-07 DIAGNOSIS — Z6841 Body Mass Index (BMI) 40.0 and over, adult: Secondary | ICD-10-CM | POA: Diagnosis not present

## 2022-08-07 DIAGNOSIS — D510 Vitamin B12 deficiency anemia due to intrinsic factor deficiency: Secondary | ICD-10-CM | POA: Diagnosis not present

## 2022-08-07 DIAGNOSIS — E1142 Type 2 diabetes mellitus with diabetic polyneuropathy: Secondary | ICD-10-CM | POA: Diagnosis not present

## 2022-08-07 DIAGNOSIS — Z7984 Long term (current) use of oral hypoglycemic drugs: Secondary | ICD-10-CM | POA: Diagnosis not present

## 2022-08-14 DIAGNOSIS — D519 Vitamin B12 deficiency anemia, unspecified: Secondary | ICD-10-CM | POA: Diagnosis not present

## 2022-08-17 DIAGNOSIS — E7800 Pure hypercholesterolemia, unspecified: Secondary | ICD-10-CM | POA: Diagnosis not present

## 2022-08-17 DIAGNOSIS — I10 Essential (primary) hypertension: Secondary | ICD-10-CM | POA: Diagnosis not present

## 2022-08-17 DIAGNOSIS — E1169 Type 2 diabetes mellitus with other specified complication: Secondary | ICD-10-CM | POA: Diagnosis not present

## 2022-08-20 ENCOUNTER — Ambulatory Visit: Payer: PPO | Admitting: Internal Medicine

## 2022-08-21 DIAGNOSIS — D519 Vitamin B12 deficiency anemia, unspecified: Secondary | ICD-10-CM | POA: Diagnosis not present

## 2022-09-09 ENCOUNTER — Telehealth: Payer: Self-pay

## 2022-09-09 NOTE — Patient Outreach (Signed)
  Care Coordination   Initial Visit Note   09/09/2022 Name: Angel [name] MRN: [id number] DOB: December 06, 1950  Angel [name] is a 72 y.o. year old male who sees Street, Stephanie Coup, MD for primary care. I spoke with  Halford Decamp by phone today.  What matters to the patients health and wellness today?  Placed call to patient to review and offer [facility] care coordination program.  Patient reports that he is taking issues with his DM.  Reports fasting CBG 200's. States that he is following his diet and taking his medications as prescribed. Reviewed with patient that I am happy to assist him but he declines. States he does not want any assistance at this time.  Encouraged patient to call me back if he changes his mind.     SDOH assessments and interventions completed:  No     Care Coordination Interventions:  No, not indicated   Follow up plan: No further intervention required.   Encounter Outcome:  Pt. Refused   Rowe Pavy, RN, BSN, CEN [facility] [employer] [phone]

## 2022-09-16 DIAGNOSIS — E1169 Type 2 diabetes mellitus with other specified complication: Secondary | ICD-10-CM | POA: Diagnosis not present

## 2022-09-16 DIAGNOSIS — E7800 Pure hypercholesterolemia, unspecified: Secondary | ICD-10-CM | POA: Diagnosis not present

## 2022-09-16 DIAGNOSIS — I10 Essential (primary) hypertension: Secondary | ICD-10-CM | POA: Diagnosis not present

## 2022-09-22 DIAGNOSIS — D519 Vitamin B12 deficiency anemia, unspecified: Secondary | ICD-10-CM | POA: Diagnosis not present

## 2022-10-17 DIAGNOSIS — E785 Hyperlipidemia, unspecified: Secondary | ICD-10-CM | POA: Diagnosis not present

## 2022-10-17 DIAGNOSIS — E1142 Type 2 diabetes mellitus with diabetic polyneuropathy: Secondary | ICD-10-CM | POA: Diagnosis not present

## 2022-10-17 DIAGNOSIS — E1169 Type 2 diabetes mellitus with other specified complication: Secondary | ICD-10-CM | POA: Diagnosis not present

## 2022-10-17 DIAGNOSIS — E538 Deficiency of other specified B group vitamins: Secondary | ICD-10-CM | POA: Diagnosis not present

## 2022-10-17 DIAGNOSIS — I10 Essential (primary) hypertension: Secondary | ICD-10-CM | POA: Diagnosis not present

## 2022-10-17 DIAGNOSIS — E7800 Pure hypercholesterolemia, unspecified: Secondary | ICD-10-CM | POA: Diagnosis not present

## 2022-10-17 DIAGNOSIS — Z6837 Body mass index (BMI) 37.0-37.9, adult: Secondary | ICD-10-CM | POA: Diagnosis not present

## 2022-11-04 DIAGNOSIS — Z7984 Long term (current) use of oral hypoglycemic drugs: Secondary | ICD-10-CM | POA: Diagnosis not present

## 2022-11-04 DIAGNOSIS — I10 Essential (primary) hypertension: Secondary | ICD-10-CM | POA: Diagnosis not present

## 2022-11-04 DIAGNOSIS — E1142 Type 2 diabetes mellitus with diabetic polyneuropathy: Secondary | ICD-10-CM | POA: Diagnosis not present
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
## 2022-11-17 ENCOUNTER — Ambulatory Visit: Payer: PPO | Admitting: Internal Medicine

## 2022-11-17 DIAGNOSIS — E538 Deficiency of other specified B group vitamins: Secondary | ICD-10-CM | POA: Diagnosis not present

## 2022-11-27 DIAGNOSIS — L602 Onychogryphosis: Secondary | ICD-10-CM | POA: Diagnosis not present

## 2022-11-27 DIAGNOSIS — E1142 Type 2 diabetes mellitus with diabetic polyneuropathy: Secondary | ICD-10-CM | POA: Diagnosis not present

## 2022-12-05 DIAGNOSIS — I700 Atherosclerosis of aorta: Secondary | ICD-10-CM | POA: Diagnosis not present

## 2022-12-05 DIAGNOSIS — E1169 Type 2 diabetes mellitus with other specified complication: Secondary | ICD-10-CM | POA: Diagnosis not present

## 2022-12-05 DIAGNOSIS — N401 Enlarged prostate with lower urinary tract symptoms: Secondary | ICD-10-CM | POA: Diagnosis not present

## 2022-12-31 DIAGNOSIS — N401 Enlarged prostate with lower urinary tract symptoms: Secondary | ICD-10-CM | POA: Diagnosis not present

## 2022-12-31 DIAGNOSIS — E1169 Type 2 diabetes mellitus with other specified complication: Secondary | ICD-10-CM | POA: Diagnosis not present

## 2022-12-31 DIAGNOSIS — N138 Other obstructive and reflux uropathy: Secondary | ICD-10-CM | POA: Diagnosis not present

## 2022-12-31 DIAGNOSIS — E538 Deficiency of other specified B group vitamins: Secondary | ICD-10-CM | POA: Diagnosis not present

## 2022-12-31 DIAGNOSIS — Z79899 Other long term (current) drug therapy: Secondary | ICD-10-CM | POA: Diagnosis not present

## 2022-12-31 DIAGNOSIS — E785 Hyperlipidemia, unspecified: Secondary | ICD-10-CM | POA: Diagnosis not present

## 2023-01-20 DIAGNOSIS — Z794 Long term (current) use of insulin: Secondary | ICD-10-CM | POA: Diagnosis not present

## 2023-01-20 DIAGNOSIS — H5203 Hypermetropia, bilateral: Secondary | ICD-10-CM | POA: Diagnosis not present

## 2023-01-20 DIAGNOSIS — H524 Presbyopia: Secondary | ICD-10-CM | POA: Diagnosis not present

## 2023-01-20 DIAGNOSIS — H1840 Unspecified corneal degeneration: Secondary | ICD-10-CM | POA: Diagnosis not present

## 2023-01-20 DIAGNOSIS — H52223 Regular astigmatism, bilateral: Secondary | ICD-10-CM | POA: Diagnosis not present

## 2023-01-20 DIAGNOSIS — Z7984 Long term (current) use of oral hypoglycemic drugs: Secondary | ICD-10-CM | POA: Diagnosis not present

## 2023-01-20 DIAGNOSIS — H35373 Puckering of macula, bilateral: Secondary | ICD-10-CM | POA: Diagnosis not present

## 2023-01-20 DIAGNOSIS — E119 Type 2 diabetes mellitus without complications: Secondary | ICD-10-CM | POA: Diagnosis not present

## 2023-01-20 DIAGNOSIS — H18501 Unspecified hereditary corneal dystrophies, right eye: Secondary | ICD-10-CM | POA: Diagnosis not present

## 2023-01-20 DIAGNOSIS — H18461 Peripheral corneal degeneration, right eye: Secondary | ICD-10-CM | POA: Diagnosis not present

## 2023-02-05 DIAGNOSIS — Z23 Encounter for immunization: Secondary | ICD-10-CM | POA: Diagnosis not present

## 2023-02-05 DIAGNOSIS — Z8781 Personal history of (healed) traumatic fracture: Secondary | ICD-10-CM | POA: Diagnosis not present

## 2023-02-05 DIAGNOSIS — E1169 Type 2 diabetes mellitus with other specified complication: Secondary | ICD-10-CM | POA: Diagnosis not present

## 2023-02-05 DIAGNOSIS — D6869 Other thrombophilia: Secondary | ICD-10-CM | POA: Diagnosis not present

## 2023-02-05 DIAGNOSIS — E785 Hyperlipidemia, unspecified: Secondary | ICD-10-CM | POA: Diagnosis not present

## 2023-02-05 DIAGNOSIS — Z6839 Body mass index (BMI) 39.0-39.9, adult: Secondary | ICD-10-CM | POA: Diagnosis not present

## 2023-02-05 DIAGNOSIS — E1142 Type 2 diabetes mellitus with diabetic polyneuropathy: Secondary | ICD-10-CM | POA: Diagnosis not present

## 2023-02-05 DIAGNOSIS — I480 Paroxysmal atrial fibrillation: Secondary | ICD-10-CM | POA: Diagnosis not present

## 2023-02-05 DIAGNOSIS — I82541 Chronic embolism and thrombosis of right tibial vein: Secondary | ICD-10-CM | POA: Diagnosis not present

## 2023-02-05 DIAGNOSIS — S069X9S Unspecified intracranial injury with loss of consciousness of unspecified duration, sequela: Secondary | ICD-10-CM | POA: Diagnosis not present

## 2023-02-05 DIAGNOSIS — I10 Essential (primary) hypertension: Secondary | ICD-10-CM | POA: Diagnosis not present

## 2023-02-12 DIAGNOSIS — Z6838 Body mass index (BMI) 38.0-38.9, adult: Secondary | ICD-10-CM | POA: Diagnosis not present

## 2023-02-12 DIAGNOSIS — E538 Deficiency of other specified B group vitamins: Secondary | ICD-10-CM | POA: Diagnosis not present

## 2023-02-12 DIAGNOSIS — E785 Hyperlipidemia, unspecified: Secondary | ICD-10-CM | POA: Diagnosis not present

## 2023-02-12 DIAGNOSIS — E1169 Type 2 diabetes mellitus with other specified complication: Secondary | ICD-10-CM | POA: Diagnosis not present

## 2023-02-12 DIAGNOSIS — J329 Chronic sinusitis, unspecified: Secondary | ICD-10-CM | POA: Diagnosis not present

## 2023-02-12 DIAGNOSIS — H60392 Other infective otitis externa, left ear: Secondary | ICD-10-CM | POA: Diagnosis not present

## 2023-02-12 DIAGNOSIS — J40 Bronchitis, not specified as acute or chronic: Secondary | ICD-10-CM | POA: Diagnosis not present

## 2023-02-26 DIAGNOSIS — D519 Vitamin B12 deficiency anemia, unspecified: Secondary | ICD-10-CM | POA: Diagnosis not present

## 2023-03-06 ENCOUNTER — Ambulatory Visit: Payer: PPO | Admitting: Internal Medicine

## 2023-03-13 DIAGNOSIS — E538 Deficiency of other specified B group vitamins: Secondary | ICD-10-CM | POA: Diagnosis not present

## 2023-03-31 DIAGNOSIS — E538 Deficiency of other specified B group vitamins: Secondary | ICD-10-CM | POA: Diagnosis not present

## 2023-04-20 DIAGNOSIS — E538 Deficiency of other specified B group vitamins: Secondary | ICD-10-CM | POA: Diagnosis not present

## 2023-05-05 DIAGNOSIS — Z79899 Other long term (current) drug therapy: Secondary | ICD-10-CM | POA: Diagnosis not present

## 2023-05-05 DIAGNOSIS — E1169 Type 2 diabetes mellitus with other specified complication: Secondary | ICD-10-CM | POA: Diagnosis not present

## 2023-05-05 DIAGNOSIS — E538 Deficiency of other specified B group vitamins: Secondary | ICD-10-CM | POA: Diagnosis not present

## 2023-05-05 DIAGNOSIS — E291 Testicular hypofunction: Secondary | ICD-10-CM | POA: Diagnosis not present

## 2023-05-05 DIAGNOSIS — E785 Hyperlipidemia, unspecified: Secondary | ICD-10-CM | POA: Diagnosis not present

## 2023-05-05 DIAGNOSIS — E559 Vitamin D deficiency, unspecified: Secondary | ICD-10-CM | POA: Diagnosis not present

## 2023-05-08 DIAGNOSIS — I10 Essential (primary) hypertension: Secondary | ICD-10-CM | POA: Diagnosis not present

## 2023-05-08 DIAGNOSIS — I480 Paroxysmal atrial fibrillation: Secondary | ICD-10-CM | POA: Diagnosis not present

## 2023-05-08 DIAGNOSIS — E538 Deficiency of other specified B group vitamins: Secondary | ICD-10-CM | POA: Diagnosis not present

## 2023-05-08 DIAGNOSIS — G63 Polyneuropathy in diseases classified elsewhere: Secondary | ICD-10-CM | POA: Diagnosis not present

## 2023-05-08 DIAGNOSIS — E270 Other adrenocortical overactivity: Secondary | ICD-10-CM | POA: Diagnosis not present

## 2023-05-08 DIAGNOSIS — E1169 Type 2 diabetes mellitus with other specified complication: Secondary | ICD-10-CM | POA: Diagnosis not present

## 2023-05-08 DIAGNOSIS — E1142 Type 2 diabetes mellitus with diabetic polyneuropathy: Secondary | ICD-10-CM | POA: Diagnosis not present

## 2023-05-08 DIAGNOSIS — D6869 Other thrombophilia: Secondary | ICD-10-CM | POA: Diagnosis not present

## 2023-05-08 DIAGNOSIS — E785 Hyperlipidemia, unspecified: Secondary | ICD-10-CM | POA: Diagnosis not present

## 2023-05-08 DIAGNOSIS — K2960 Other gastritis without bleeding: Secondary | ICD-10-CM | POA: Diagnosis not present

## 2023-05-08 DIAGNOSIS — T50905A Adverse effect of unspecified drugs, medicaments and biological substances, initial encounter: Secondary | ICD-10-CM | POA: Diagnosis not present

## 2023-05-08 DIAGNOSIS — E876 Hypokalemia: Secondary | ICD-10-CM | POA: Diagnosis not present

## 2023-06-05 DIAGNOSIS — E538 Deficiency of other specified B group vitamins: Secondary | ICD-10-CM | POA: Diagnosis not present

## 2023-07-07 DIAGNOSIS — E538 Deficiency of other specified B group vitamins: Secondary | ICD-10-CM | POA: Diagnosis not present

## 2023-07-17 DIAGNOSIS — E1142 Type 2 diabetes mellitus with diabetic polyneuropathy: Secondary | ICD-10-CM | POA: Diagnosis not present

## 2023-07-17 DIAGNOSIS — E1165 Type 2 diabetes mellitus with hyperglycemia: Secondary | ICD-10-CM | POA: Diagnosis not present

## 2023-08-17 DIAGNOSIS — E538 Deficiency of other specified B group vitamins: Secondary | ICD-10-CM | POA: Diagnosis not present

## 2023-09-15 ENCOUNTER — Telehealth: Payer: Self-pay | Admitting: Pharmacist

## 2023-09-15 NOTE — Progress Notes (Signed)
 Attempted to contact patient for medication management. Left HIPAA compliant message for patient to return my call at their convenience.    Reynold Bowen, PharmD Clinical Pharmacist [place] Direct Dial: [phone]

## 2023-09-22 DIAGNOSIS — E1169 Type 2 diabetes mellitus with other specified complication: Secondary | ICD-10-CM | POA: Diagnosis not present

## 2023-09-22 DIAGNOSIS — E785 Hyperlipidemia, unspecified: Secondary | ICD-10-CM | POA: Diagnosis not present

## 2023-09-22 DIAGNOSIS — E291 Testicular hypofunction: Secondary | ICD-10-CM | POA: Diagnosis not present

## 2023-09-22 DIAGNOSIS — E559 Vitamin D deficiency, unspecified: Secondary | ICD-10-CM | POA: Diagnosis not present

## 2023-09-22 DIAGNOSIS — D519 Vitamin B12 deficiency anemia, unspecified: Secondary | ICD-10-CM | POA: Diagnosis not present

## 2023-09-22 DIAGNOSIS — D500 Iron deficiency anemia secondary to blood loss (chronic): Secondary | ICD-10-CM | POA: Diagnosis not present

## 2023-09-25 DIAGNOSIS — I82541 Chronic embolism and thrombosis of right tibial vein: Secondary | ICD-10-CM | POA: Diagnosis not present

## 2023-09-25 DIAGNOSIS — I700 Atherosclerosis of aorta: Secondary | ICD-10-CM | POA: Diagnosis not present

## 2023-09-25 DIAGNOSIS — I10 Essential (primary) hypertension: Secondary | ICD-10-CM | POA: Diagnosis not present

## 2023-09-25 DIAGNOSIS — S066XAA Traumatic subarachnoid hemorrhage with loss of consciousness status unknown, initial encounter: Secondary | ICD-10-CM | POA: Diagnosis not present

## 2023-09-25 DIAGNOSIS — I480 Paroxysmal atrial fibrillation: Secondary | ICD-10-CM | POA: Diagnosis not present

## 2023-09-25 DIAGNOSIS — S065XAA Traumatic subdural hemorrhage with loss of consciousness status unknown, initial encounter: Secondary | ICD-10-CM | POA: Diagnosis not present

## 2023-09-25 DIAGNOSIS — E785 Hyperlipidemia, unspecified: Secondary | ICD-10-CM | POA: Diagnosis not present

## 2023-09-25 DIAGNOSIS — E1169 Type 2 diabetes mellitus with other specified complication: Secondary | ICD-10-CM | POA: Diagnosis not present

## 2023-09-25 DIAGNOSIS — D6869 Other thrombophilia: Secondary | ICD-10-CM | POA: Diagnosis not present

## 2023-09-25 DIAGNOSIS — Z0000 Encounter for general adult medical examination without abnormal findings: Secondary | ICD-10-CM | POA: Diagnosis not present

## 2023-09-25 DIAGNOSIS — S069X9S Unspecified intracranial injury with loss of consciousness of unspecified duration, sequela: Secondary | ICD-10-CM | POA: Diagnosis not present

## 2023-09-25 DIAGNOSIS — E1142 Type 2 diabetes mellitus with diabetic polyneuropathy: Secondary | ICD-10-CM | POA: Diagnosis not present

## 2023-10-23 DIAGNOSIS — E538 Deficiency of other specified B group vitamins: Secondary | ICD-10-CM | POA: Diagnosis not present

## 2023-11-09 DIAGNOSIS — T161XXA Foreign body in right ear, initial encounter: Secondary | ICD-10-CM | POA: Diagnosis not present

## 2023-12-02 DIAGNOSIS — E538 Deficiency of other specified B group vitamins: Secondary | ICD-10-CM | POA: Diagnosis not present

## 2023-12-28 DIAGNOSIS — E559 Vitamin D deficiency, unspecified: Secondary | ICD-10-CM | POA: Diagnosis not present

## 2023-12-28 DIAGNOSIS — E1169 Type 2 diabetes mellitus with other specified complication: Secondary | ICD-10-CM | POA: Diagnosis not present

## 2023-12-28 DIAGNOSIS — E785 Hyperlipidemia, unspecified: Secondary | ICD-10-CM | POA: Diagnosis not present

## 2023-12-30 DIAGNOSIS — L608 Other nail disorders: Secondary | ICD-10-CM | POA: Diagnosis not present

## 2023-12-30 DIAGNOSIS — S069X9S Unspecified intracranial injury with loss of consciousness of unspecified duration, sequela: Secondary | ICD-10-CM | POA: Diagnosis not present

## 2023-12-30 DIAGNOSIS — E1169 Type 2 diabetes mellitus with other specified complication: Secondary | ICD-10-CM | POA: Diagnosis not present

## 2023-12-30 DIAGNOSIS — E785 Hyperlipidemia, unspecified: Secondary | ICD-10-CM | POA: Diagnosis not present

## 2023-12-30 DIAGNOSIS — D6869 Other thrombophilia: Secondary | ICD-10-CM | POA: Diagnosis not present

## 2023-12-30 DIAGNOSIS — I10 Essential (primary) hypertension: Secondary | ICD-10-CM | POA: Diagnosis not present

## 2023-12-30 DIAGNOSIS — N401 Enlarged prostate with lower urinary tract symptoms: Secondary | ICD-10-CM | POA: Diagnosis not present

## 2023-12-30 DIAGNOSIS — I480 Paroxysmal atrial fibrillation: Secondary | ICD-10-CM | POA: Diagnosis not present

## 2023-12-30 DIAGNOSIS — Z6841 Body Mass Index (BMI) 40.0 and over, adult: Secondary | ICD-10-CM | POA: Diagnosis not present

## 2023-12-30 DIAGNOSIS — G63 Polyneuropathy in diseases classified elsewhere: Secondary | ICD-10-CM | POA: Diagnosis not present

## 2023-12-30 DIAGNOSIS — K2960 Other gastritis without bleeding: Secondary | ICD-10-CM | POA: Diagnosis not present

## 2024-01-14 DIAGNOSIS — L602 Onychogryphosis: Secondary | ICD-10-CM | POA: Diagnosis not present

## 2024-01-14 DIAGNOSIS — S90416A Abrasion, unspecified lesser toe(s), initial encounter: Secondary | ICD-10-CM | POA: Diagnosis not present

## 2024-01-14 DIAGNOSIS — E1165 Type 2 diabetes mellitus with hyperglycemia: Secondary | ICD-10-CM | POA: Diagnosis not present

## 2024-01-14 DIAGNOSIS — E1142 Type 2 diabetes mellitus with diabetic polyneuropathy: Secondary | ICD-10-CM | POA: Diagnosis not present

## 2024-01-14 DIAGNOSIS — Z7984 Long term (current) use of oral hypoglycemic drugs: Secondary | ICD-10-CM | POA: Diagnosis not present

## 2024-02-16 DIAGNOSIS — H5203 Hypermetropia, bilateral: Secondary | ICD-10-CM | POA: Diagnosis not present

## 2024-02-16 DIAGNOSIS — H1840 Unspecified corneal degeneration: Secondary | ICD-10-CM | POA: Diagnosis not present

## 2024-02-16 DIAGNOSIS — Z7984 Long term (current) use of oral hypoglycemic drugs: Secondary | ICD-10-CM | POA: Diagnosis not present

## 2024-02-16 DIAGNOSIS — H52223 Regular astigmatism, bilateral: Secondary | ICD-10-CM | POA: Diagnosis not present

## 2024-02-16 DIAGNOSIS — H18463 Peripheral corneal degeneration, bilateral: Secondary | ICD-10-CM | POA: Diagnosis not present

## 2024-02-16 DIAGNOSIS — H524 Presbyopia: Secondary | ICD-10-CM | POA: Diagnosis not present

## 2024-02-16 DIAGNOSIS — D3132 Benign neoplasm of left choroid: Secondary | ICD-10-CM | POA: Diagnosis not present

## 2024-02-16 DIAGNOSIS — E119 Type 2 diabetes mellitus without complications: Secondary | ICD-10-CM | POA: Diagnosis not present

## 2024-02-16 DIAGNOSIS — Z794 Long term (current) use of insulin: Secondary | ICD-10-CM | POA: Diagnosis not present

## 2024-02-16 DIAGNOSIS — H25813 Combined forms of age-related cataract, bilateral: Secondary | ICD-10-CM | POA: Diagnosis not present

## 2024-02-16 DIAGNOSIS — H18503 Unspecified hereditary corneal dystrophies, bilateral: Secondary | ICD-10-CM | POA: Diagnosis not present

## 2024-04-18 DIAGNOSIS — E1169 Type 2 diabetes mellitus with other specified complication: Secondary | ICD-10-CM | POA: Diagnosis not present

## 2024-04-18 DIAGNOSIS — S069X9S Unspecified intracranial injury with loss of consciousness of unspecified duration, sequela: Secondary | ICD-10-CM | POA: Diagnosis not present

## 2024-04-18 DIAGNOSIS — I10 Essential (primary) hypertension: Secondary | ICD-10-CM | POA: Diagnosis not present

## 2024-04-18 DIAGNOSIS — E785 Hyperlipidemia, unspecified: Secondary | ICD-10-CM | POA: Diagnosis not present

## 2024-04-18 DIAGNOSIS — E538 Deficiency of other specified B group vitamins: Secondary | ICD-10-CM | POA: Diagnosis not present

## 2024-04-18 DIAGNOSIS — K2960 Other gastritis without bleeding: Secondary | ICD-10-CM | POA: Diagnosis not present

## 2024-04-18 DIAGNOSIS — Z23 Encounter for immunization: Secondary | ICD-10-CM | POA: Diagnosis not present

## 2024-04-18 DIAGNOSIS — G63 Polyneuropathy in diseases classified elsewhere: Secondary | ICD-10-CM | POA: Diagnosis not present

## 2024-04-18 DIAGNOSIS — E1142 Type 2 diabetes mellitus with diabetic polyneuropathy: Secondary | ICD-10-CM | POA: Diagnosis not present

## 2024-04-18 DIAGNOSIS — Z79899 Other long term (current) drug therapy: Secondary | ICD-10-CM | POA: Diagnosis not present
